# Patient Record
Sex: Male | Born: 1955 | Race: Black or African American | Hispanic: No | Marital: Single | Smoking: Never smoker
Health system: Southern US, Community
[De-identification: ages and names within clinical notes are randomized; demographics above are authoritative.]

## PROBLEM LIST (undated history)

## (undated) DIAGNOSIS — I10 Essential (primary) hypertension: Secondary | ICD-10-CM

## (undated) DIAGNOSIS — Z85068 Personal history of other malignant neoplasm of small intestine: Secondary | ICD-10-CM

## (undated) DIAGNOSIS — N179 Acute kidney failure, unspecified: Secondary | ICD-10-CM

## (undated) DIAGNOSIS — E785 Hyperlipidemia, unspecified: Secondary | ICD-10-CM

## (undated) DIAGNOSIS — Z87442 Personal history of urinary calculi: Secondary | ICD-10-CM

## (undated) DIAGNOSIS — Z801 Family history of malignant neoplasm of trachea, bronchus and lung: Secondary | ICD-10-CM

## (undated) HISTORY — DX: Personal history of other malignant neoplasm of small intestine: Z85.068

## (undated) HISTORY — DX: Acute kidney failure, unspecified: N17.9

## (undated) HISTORY — DX: Personal history of urinary calculi: Z87.442

## (undated) HISTORY — DX: Family history of malignant neoplasm of trachea, bronchus and lung: Z80.1

## (undated) HISTORY — DX: Essential (primary) hypertension: I10

## (undated) HISTORY — DX: Hyperlipidemia, unspecified: E78.5

---

## 2007-06-19 ENCOUNTER — Ambulatory Visit: Payer: Self-pay | Admitting: Family Medicine

## 2007-06-19 ENCOUNTER — Observation Stay (HOSPITAL_COMMUNITY): Admission: EM | Admit: 2007-06-19 | Discharge: 2007-06-20 | Payer: Self-pay | Admitting: Emergency Medicine

## 2007-06-20 ENCOUNTER — Ambulatory Visit: Payer: Self-pay | Admitting: Family Medicine

## 2007-06-26 ENCOUNTER — Ambulatory Visit: Payer: Self-pay | Admitting: Family Medicine

## 2007-06-26 DIAGNOSIS — Z87442 Personal history of urinary calculi: Secondary | ICD-10-CM

## 2007-06-26 DIAGNOSIS — N179 Acute kidney failure, unspecified: Secondary | ICD-10-CM

## 2007-06-26 HISTORY — DX: Personal history of urinary calculi: Z87.442

## 2007-07-10 ENCOUNTER — Ambulatory Visit: Payer: Self-pay | Admitting: Family Medicine

## 2007-07-10 ENCOUNTER — Encounter: Payer: Self-pay | Admitting: Family Medicine

## 2007-07-10 LAB — CONVERTED CEMR LAB
BUN: 17 mg/dL (ref 6–23)
CO2: 22 meq/L (ref 19–32)
Calcium: 9.4 mg/dL (ref 8.4–10.5)
Chloride: 105 meq/L (ref 96–112)
Creatinine, Ser: 1.01 mg/dL (ref 0.40–1.50)
Glucose, Bld: 93 mg/dL (ref 70–99)
Potassium: 4.7 meq/L (ref 3.5–5.3)
Sodium: 140 meq/L (ref 135–145)

## 2007-07-11 ENCOUNTER — Encounter: Payer: Self-pay | Admitting: Family Medicine

## 2009-01-22 ENCOUNTER — Ambulatory Visit: Payer: Self-pay | Admitting: Family Medicine

## 2009-01-22 ENCOUNTER — Encounter (INDEPENDENT_AMBULATORY_CARE_PROVIDER_SITE_OTHER): Payer: Self-pay | Admitting: Family Medicine

## 2009-01-22 DIAGNOSIS — I10 Essential (primary) hypertension: Secondary | ICD-10-CM | POA: Insufficient documentation

## 2009-01-23 LAB — CONVERTED CEMR LAB
BUN: 12 mg/dL (ref 6–23)
CO2: 25 meq/L (ref 19–32)
Calcium: 9.1 mg/dL (ref 8.4–10.5)
Chloride: 105 meq/L (ref 96–112)
Creatinine, Ser: 0.89 mg/dL (ref 0.40–1.50)
Glucose, Bld: 105 mg/dL — ABNORMAL HIGH (ref 70–99)
Potassium: 4.5 meq/L (ref 3.5–5.3)
Sodium: 140 meq/L (ref 135–145)

## 2009-02-06 ENCOUNTER — Encounter: Payer: Self-pay | Admitting: Family Medicine

## 2009-02-06 ENCOUNTER — Ambulatory Visit: Payer: Self-pay | Admitting: Family Medicine

## 2009-02-12 ENCOUNTER — Encounter: Payer: Self-pay | Admitting: Family Medicine

## 2009-02-12 LAB — CONVERTED CEMR LAB
ALT: 31 units/L (ref 0–53)
AST: 28 units/L (ref 0–37)
Albumin: 4.2 g/dL (ref 3.5–5.2)
Alkaline Phosphatase: 59 units/L (ref 39–117)
BUN: 14 mg/dL (ref 6–23)
CO2: 24 meq/L (ref 19–32)
Calcium: 9.3 mg/dL (ref 8.4–10.5)
Chloride: 105 meq/L (ref 96–112)
Creatinine, Ser: 0.94 mg/dL (ref 0.40–1.50)
Direct LDL: 139 mg/dL — ABNORMAL HIGH
Glucose, Bld: 105 mg/dL — ABNORMAL HIGH (ref 70–99)
Potassium: 4.2 meq/L (ref 3.5–5.3)
Sodium: 138 meq/L (ref 135–145)
Total Bilirubin: 0.3 mg/dL (ref 0.3–1.2)
Total Protein: 7.3 g/dL (ref 6.0–8.3)

## 2009-02-20 ENCOUNTER — Encounter: Payer: Self-pay | Admitting: Family Medicine

## 2009-02-23 ENCOUNTER — Encounter: Payer: Self-pay | Admitting: *Deleted

## 2009-03-02 ENCOUNTER — Encounter: Payer: Self-pay | Admitting: Family Medicine

## 2009-05-11 ENCOUNTER — Encounter: Payer: Self-pay | Admitting: Family Medicine

## 2009-07-15 ENCOUNTER — Ambulatory Visit: Payer: Self-pay | Admitting: Family Medicine

## 2009-07-15 DIAGNOSIS — E663 Overweight: Secondary | ICD-10-CM | POA: Insufficient documentation

## 2009-07-15 LAB — CONVERTED CEMR LAB

## 2009-07-20 ENCOUNTER — Ambulatory Visit: Payer: Self-pay | Admitting: Family Medicine

## 2009-07-20 ENCOUNTER — Encounter: Payer: Self-pay | Admitting: Family Medicine

## 2009-07-20 LAB — CONVERTED CEMR LAB
ALT: 23 units/L (ref 0–53)
BUN: 12 mg/dL (ref 6–23)
CO2: 24 meq/L (ref 19–32)
Calcium: 9.4 mg/dL (ref 8.4–10.5)
Chloride: 103 meq/L (ref 96–112)
Cholesterol: 190 mg/dL (ref 0–200)
Creatinine, Ser: 0.91 mg/dL (ref 0.40–1.50)
HCT: 37.6 % — ABNORMAL LOW (ref 39.0–52.0)
Hemoglobin: 12.2 g/dL — ABNORMAL LOW (ref 13.0–17.0)
Platelets: 287 10*3/uL (ref 150–400)
Total CHOL/HDL Ratio: 5
WBC: 6 10*3/uL (ref 4.0–10.5)

## 2009-09-16 ENCOUNTER — Encounter: Payer: Self-pay | Admitting: Family Medicine

## 2009-09-16 ENCOUNTER — Encounter: Payer: Self-pay | Admitting: *Deleted

## 2009-09-16 ENCOUNTER — Ambulatory Visit: Payer: Self-pay | Admitting: Family Medicine

## 2009-09-16 DIAGNOSIS — E782 Mixed hyperlipidemia: Secondary | ICD-10-CM

## 2009-10-21 ENCOUNTER — Encounter: Payer: Self-pay | Admitting: Family Medicine

## 2009-10-21 ENCOUNTER — Ambulatory Visit: Payer: Self-pay | Admitting: Family Medicine

## 2009-10-22 ENCOUNTER — Encounter: Payer: Self-pay | Admitting: Family Medicine

## 2009-10-22 LAB — CONVERTED CEMR LAB
CO2: 25 meq/L (ref 19–32)
Calcium: 9 mg/dL (ref 8.4–10.5)
Glucose, Bld: 177 mg/dL — ABNORMAL HIGH (ref 70–99)
Sodium: 144 meq/L (ref 135–145)

## 2009-10-30 ENCOUNTER — Ambulatory Visit: Payer: Self-pay | Admitting: Family Medicine

## 2009-11-18 ENCOUNTER — Encounter: Payer: Self-pay | Admitting: Family Medicine

## 2009-12-24 ENCOUNTER — Telehealth: Payer: Self-pay | Admitting: *Deleted

## 2009-12-25 ENCOUNTER — Encounter: Payer: Self-pay | Admitting: Family Medicine

## 2010-01-28 ENCOUNTER — Encounter: Payer: Self-pay | Admitting: Family Medicine

## 2010-03-30 ENCOUNTER — Telehealth: Payer: Self-pay | Admitting: Family Medicine

## 2010-04-07 ENCOUNTER — Ambulatory Visit: Payer: Self-pay | Admitting: Family Medicine

## 2010-04-07 ENCOUNTER — Encounter: Payer: Self-pay | Admitting: Family Medicine

## 2010-04-07 DIAGNOSIS — E119 Type 2 diabetes mellitus without complications: Secondary | ICD-10-CM | POA: Insufficient documentation

## 2010-04-07 LAB — CONVERTED CEMR LAB
AST: 28 units/L (ref 0–37)
BUN: 11 mg/dL (ref 6–23)
CO2: 23 meq/L (ref 19–32)
Calcium: 9.3 mg/dL (ref 8.4–10.5)
Chloride: 105 meq/L (ref 96–112)
Creatinine, Ser: 0.9 mg/dL (ref 0.40–1.50)
Glucose, Bld: 186 mg/dL — ABNORMAL HIGH (ref 70–99)
Total CK: 256 units/L — ABNORMAL HIGH (ref 7–232)

## 2010-04-08 ENCOUNTER — Encounter: Payer: Self-pay | Admitting: Family Medicine

## 2010-04-16 ENCOUNTER — Encounter: Payer: Self-pay | Admitting: Family Medicine

## 2010-04-16 ENCOUNTER — Ambulatory Visit: Payer: Self-pay | Admitting: Family Medicine

## 2010-04-16 LAB — CONVERTED CEMR LAB
Chloride: 104 meq/L (ref 96–112)
Potassium: 4 meq/L (ref 3.5–5.3)
Sodium: 139 meq/L (ref 135–145)

## 2010-04-26 ENCOUNTER — Encounter: Admission: RE | Admit: 2010-04-26 | Discharge: 2010-06-24 | Payer: Self-pay | Admitting: Family Medicine

## 2010-04-27 ENCOUNTER — Ambulatory Visit: Payer: Self-pay | Admitting: Family Medicine

## 2010-04-27 ENCOUNTER — Encounter: Payer: Self-pay | Admitting: Family Medicine

## 2010-04-28 LAB — CONVERTED CEMR LAB
BUN: 16 mg/dL (ref 6–23)
CO2: 23 meq/L (ref 19–32)
Chloride: 105 meq/L (ref 96–112)
Glucose, Bld: 173 mg/dL — ABNORMAL HIGH (ref 70–99)
Potassium: 4.3 meq/L (ref 3.5–5.3)

## 2010-06-25 ENCOUNTER — Ambulatory Visit: Payer: Self-pay | Admitting: Family Medicine

## 2010-10-26 NOTE — Letter (Signed)
Summary: Urology referral  [facility]  [address]   [place], [place] [address]   Phone: [phone]  Fax: [phone]    12/25/2009  OLMAN YONO [address] [place], [place]  [address]  Dear Mr. Wyszynski,  Our record indicates that you cancelled and have not rescheduled an appointment to see Dr. Brunilda Payor, a urologist I referred you to.  This referral was made due to concerns about your PSA result that was elevated in December 2010.  An elevated PSA leads [place] to be concerned about a cancerous process.  Please call Dr. Madilyn Hook office to reschedule this appointment.        Sincerely,   Summar Mcglothlin MD  Appended Document: Urology referral certified letter mailed.

## 2010-10-26 NOTE — Assessment & Plan Note (Signed)
Summary: f/up,tcb   Vital Signs:  Patient profile:   55 year old male Height:      64 inches Weight:      205 pounds BMI:     35.32 Temp:     98.1 degrees F oral Pulse rate:   73 / minute BP sitting:   161 / 94  (right arm) Cuff size:   regular  Vitals Entered By: Tessie Fass CMA (April 07, 2010 9:12 AM) CC: F/U Is Patient Diabetic? No Pain Assessment Patient in pain? no        Primary Care Provider:  Shaneece Stockburger MD  CC:  F/U.  History of Present Illness: 55 y/o M here for   HYPERTENSION Disease Monitoring Blood pressure range: 130s when he checks at [facility] Medications: Norvasc 10, Coreg XL 20 Compliance: yes Lightheadedness: no  Edema: no  Chest pain: no  Dyspnea:no Prevention Exercise: no  Salt restriction:yes   HYPERLIPIDEMIA: Med: Simvastatin  Compliance: no, stopped taking at least 1 month ago Prob taking med: yes, joint pain started after he took simva, stopped 1 month ago, states feels better Muscle aches: no, but has joint pain Abd  pain: no  Joint pain:   DIABETES Meds Compliance    Diet Lightheadedness    Dizziness    Confusion    Shakiness    Abd  pain   Nausea    Vomiting      ARF:    Habits & Providers  Alcohol-Tobacco-Diet     Tobacco Status: never  Allergies: No Known Drug Allergies  Past History:  Past Surgical History: Last updated: 06/26/2007 None  Family History: Last updated: 07/15/2009 M - d 61 CVA, MI, DM, CHF, HTN F - d 21s alcoholism 3 sisters living and healthy - one with hypertension 2 brothers living and healthy No colon CA No prostate CA  Social History: Last updated: 07/15/2009 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  No tobacco, etoh, or drug use.  Never marrieed.  Completed college.  Risk Factors: Alcohol Use: 0 (09/16/2009) Exercise: yes (09/16/2009)  Risk  Factors: Smoking Status: never (04/07/2010)  Past Medical History: ARF d/t lisinopril and/or diazide - was on both in a 2 week period - cr up to 4.99 Nephrolithiasis HTN Diabetes HLD Noncompliance with meds   Physical Exam  General:  Well-developed,well-nourished,in no acute distress; alert,appropriate and cooperative throughout examination. vitals reviewed.   Head:  Normocephalic and atraumatic without obvious abnormalities.  Neck:  No deformities, masses, or tenderness noted. Lungs:  Normal respiratory effort, chest expands symmetrically. Lungs are clear to auscultation, no crackles or wheezes. Heart:  Normal rate and regular rhythm. S1 and S2 normal without gallop, murmur, click, rub or other extra sounds. Abdomen:  Bowel sounds positive,abdomen soft and non-tender without masses, organomegaly or hernias noted. Msk:  No deformity or scoliosis noted of thoracic or lumbar spine.  normal ROM, no joint tenderness, no joint swelling, no joint warmth, and no redness over joints.   Pulses:  R and L carotid,radial,dorsalis pedisl pulses are full and equal bilaterally Extremities:  No clubbing, cyanosis, edema, or deformity noted with normal full range of motion of all joints.   Neurologic:  No cranial nerve deficits noted. Station and gait are normal. Plantar reflexes are down-going bilaterally. DTRs are symmetrical throughout. Sensory, motor and coordinative functions appear intact. Skin:  Intact without suspicious lesions or rashes Cervical Nodes:  No lymphadenopathy noted   Impression & Recommendations:  Problem # 1:  HYPERTENSION (ICD-401.9) Assessment Unchanged BPs in clinic seems to be higher than when pt checks at pharmacy.  Regardless will need ot make changes to his meds.  1) He was taking Simva 40 and Norvasc 10.  There is warning with Simva and this dose of Norvasc.  I have decreased Norvasc to 5mg .  Will continue Coreg xl 20mg .  Pt now has diabetes so will need ACEi.  In 2008 pt  was on Lisinopril and Diazide for 1 week and develeoped ARF with Cr at 4.99.  Both of these meds were held and pt given IVFs.  Renal function improved.  His last Cr here in clinic in 09/2009 was 0.87.  I plan to start pt on an ARB today.  Will also check Cr today.  Pt to rtc in 1 wk for repeat Cr check.  Will monitor renal function closely as ARB may also potentiate renal disturbance.   His updated medication list for this problem includes:    Norvasc 5 Mg Tabs (Amlodipine besylate) .Marland Kitchen... 1 tab by mouth daily for blood pressure    Coreg Cr 20 Mg Xr24h-cap (Carvedilol phosphate) .Marland Kitchen... 1 capsule by mouth daily for blood pressure    Cozaar 25 Mg Tabs (Losartan potassium) .Marland Kitchen... 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  Pt stopped taking Simva at least one month ago.  We discussed lifestyle modicaitons like diet, exercise, reducing amt of animal products.  Pt was not interested in these options.  Will switch to pravastatin.  Will check LFT and total CK.    His updated medication list for this problem includes:    Pravachol 40 Mg Tabs (Pravastatin sodium) .Marland Kitchen... 1 tab by mouth at bedtime for cholesterol  Orders: Comp Met-[facility] (30160-10932) CK (Creatine Kinase)-[facility] (82550-23250) [facility]- Est  Level 4 ([address])  Problem # 3:  PAIN IN JOINT, MULTIPLE SITES (ICD-719.49) Assessment: New No joint tenderness on exam.  Pt states improvement since he stopped taking simva.   Orders: CK (Creatine Kinase)-[facility] [phone]) [facility]- Est  Level 4 ([address]06)  Problem # 4:  DIABETES MELLITUS, TYPE II (ICD-250.00) Assessment: New New Dx in Feb 2011 when I checked A1C, which was 8.1.  I had planned to start Metformin today, but given that I have started an ARB and pt with history of ARF in 2008 s/p ACEi and Diazide.  I will hold off on starting metformin since it can  also cause renal dysfunction.  Pt to rtc in 2 wks and if renal function ok at that time we may start metformin.  If renal functin worsens we do have insulin as an option.   His updated medication list for this problem includes:    Aspirin 81 Mg Tbec (Aspirin) .Marland Kitchen... 1 tab by mouth daily    Cozaar 25 Mg Tabs (Losartan potassium) .Marland Kitchen... 1 tab by mouth daily for blood pressure  Orders: [facility]- Est  Level 4 ([address]214)  Problem # 5:  Hx of RENAL FAILURE, ACUTE NOS (ICD-584.9) Assessment: Comment Only ARF within one week of taking ACEi and DIAZIDE in 2008.  Pt was admitted and given IVF.  Offending drugs discontineued and renal function returned to normal.  We will keep close monitor on renal function as  we are treating htn and dm now.    Complete Medication List: 1)  Norvasc 5 Mg Tabs (Amlodipine besylate) .Marland Kitchen.. 1 tab by mouth daily for blood pressure 2)  Pravachol 40 Mg Tabs (Pravastatin sodium) .Marland Kitchen.. 1 tab by mouth at bedtime for cholesterol 3)  Aspirin 81 Mg Tbec (Aspirin) .Marland Kitchen.. 1 tab by mouth daily 4)  Coreg Cr 20 Mg Xr24h-cap (Carvedilol phosphate) .Marland Kitchen.. 1 capsule by mouth daily for blood pressure 5)  Cozaar 25 Mg Tabs (Losartan potassium) .Marland Kitchen.. 1 tab by mouth daily for blood pressure  Patient Instructions: 1)  Please schedule a follow-up appointment in 1 week for lab.  2)  Please schedule a follow-up appointment in 2 weeks with Dr Janalyn Harder.  3)  I have added a new blood pressure medication, Losartan.  We need to check your kidneys in one week to make sure that they are ok. 4)  If you start to feel bad, stop the medication and call [facility] right away. 5)  I will not start diabetes medication at this time since we are starting new blood pressure medication.  Prescriptions: COZAAR 25 MG TABS (LOSARTAN POTASSIUM) 1 tab by mouth daily for blood pressure  #30 x 1   Entered and Authorized by:   Angeline Slim MD   Signed by:   Angeline Slim MD on 04/07/2010   Method used:   Electronically to        [facility]  [address] [phone]*  (retail)       [address]       [place], [place]  [id number]       Ph: [phone] or [phone]       Fax: [phone]   RxID:   [id number] NORVASC 5 MG TABS (AMLODIPINE BESYLATE) 1 tab by mouth daily for blood pressure  #30 x 6   Entered and Authorized by:   Angeline Slim MD   Signed by:   Angeline Slim MD on 04/07/2010   Method used:   [facility] to        [facility]  [address] [phone]* (retail)       [address]       [place], [place]  [id number]       Ph: [phone] or [phone]       Fax: [phone]   RxID:   [id number] PRAVACHOL 40 MG TABS (PRAVASTATIN SODIUM) 1 tab by mouth at bedtime for cholesterol  #30 x 6   Entered and Authorized by:   Angeline Slim MD   Signed by:   Angeline Slim MD on 04/07/2010   Method used:   Electronically to        [facility] (retail)       [address]       [place], [place]  [address]       Ph: [phone]       Fax: [phone]   RxID:   [id number]    Prevention & Chronic Care Immunizations   Influenza vaccine: Not documented   Influenza vaccine deferral: Deferred  (07/15/2009)    Tetanus booster: 06/26/2007: Tdap [facility])   Tetanus booster due: 06/25/2017    Pneumococcal vaccine: Not documented  Colorectal Screening   Hemoccult: Not documented    Colonoscopy: Not documented   Colonoscopy action/deferral: GI Referral  (09/16/2009)  Other Screening   PSA:  8.11  (07/20/2009)   PSA action/deferral: Discussed-PSA requested  (07/15/2009)   Smoking status: never  (04/07/2010)  Diabetes Mellitus   HgbA1C: 8.1  (10/30/2009)    Eye exam: Not documented    Foot exam: Not documented   High risk foot: Not documented   Foot care education: Not documented    Urine microalbumin/creatinine ratio: Not documented    Diabetes flowsheet reviewed?: Yes   Progress toward A1C goal: Unchanged  Lipids   Total Cholesterol: 190  (07/20/2009)   LDL:  139  (07/20/2009)   LDL Direct: 139  (02/06/2009)   HDL: 38  (07/20/2009)   Triglycerides: 66  (07/20/2009)    SGOT (AST): 22  (07/20/2009)   SGPT (ALT): 23  (07/20/2009) CMP ordered    Alkaline phosphatase: 68  (07/20/2009)   Total bilirubin: 0.5  (07/20/2009)    Lipid flowsheet reviewed?: Yes   Progress toward LDL goal: Unchanged  Hypertension   Last Blood Pressure: 161 / 94  (04/07/2010)   Serum creatinine: 0.87  (10/21/2009)   Serum potassium 4.1  (10/21/2009) CMP ordered     Hypertension flowsheet reviewed?: Yes   Progress toward BP goal: Unchanged  Self-Management Support :   Personal Goals (by the next clinic visit) :     Personal A1C goal: 7  (04/07/2010)     Personal blood pressure goal: 130/80  (09/16/2009)     Personal LDL goal: 100  (09/16/2009)    Diabetes self-management support: Not documented    Hypertension self-management support: Written self-care plan  (09/16/2009)    Lipid self-management support: Written self-care plan  (09/16/2009)

## 2010-10-26 NOTE — Letter (Signed)
Summary: Missed Urology appt  [facility]  [address]   [place], [place] [address]   Phone: [phone]  Fax: [phone]    01/28/2010  Brandon Peters [address] [place], [place]  [address]  Dear Mr. Haviland,  You missed an appointment with Dr. Brunilda Payor in January.  I referred you to Dr. Brunilda Payor for elevated PSA, which is concerning for your prostate.  Please call Dr. Madilyn Hook office to schedule another appointment.     Sincerely,   Lamel Mccarley MD  Appended Document: Missed Urology appt cert mailed

## 2010-10-26 NOTE — Consult Note (Signed)
Summary: [facility]  [facility]   Imported By: Bradly Bienenstock 04/28/2010 13:07:20  _____________________________________________________________________  External Attachment:    Type:   Image     Comment:   External Document

## 2010-10-26 NOTE — Progress Notes (Signed)
Summary: Labs, meds  Phone Note Outgoing Call   Call placed by: Jasminemarie Sherrard MD,  March 30, 2010 2:27 PM Call placed to: Patient Summary of Call: Called pt Re 1)[facility]vasc 10 + simva 40.  they do not recommend dose of simva higher than 20mg  with norvasc 2)sent letter in 10/2009 re A1C of >8.  I would like to start treating.  Pt never responded. 3)missed urology appt.  Pt states he will call today to schedule appt with me next week.   Initial call taken by: Deshun Sedivy MD,  March 30, 2010 2:27 PM

## 2010-10-26 NOTE — Letter (Signed)
Summary: Generic Letter: Needs appt, needs to be treated for DM  [facility]  [address]   [place], [place] [address]   Phone: [phone]  Fax: [phone]    11/18/2009  [name] [address] [place], [place]  [address]  Dear [name],  You missed your appointment with me today.  I think that it is important that you come to the [facility] to be examined and for follow-up of high blood pressure.  I also want to discuss Diabetes with you.  Your test result showed that you have diabetes (HgA1C = 8.1%) and that you need to start taking medications to treat this.  Untreated and uncontrolled diabetes can affect your heart, kidneys, eyes, and other organs.  Please call to schedule an appointment with me as soon as possible.   Sincerely,   Kiauna Zywicki MD  Appended Document: Generic Letter: Needs appt, needs to be treated for DM mailed.

## 2010-10-26 NOTE — Progress Notes (Signed)
Summary: re: Urology/TS.  Pt cancelled Urology appt  ---- Converted from flag ---- ---- 12/24/2009 5:51 AM, Cat Ta MD wrote: Pt had appt with Dr Brunilda Payor (urology) 10/07/09.  Please call for report. thank you. CT ------------------------------  pt cancelled his appt..per Dr.Nesi's office 'it was not the first time, that he cancelled his appt'

## 2010-10-26 NOTE — Assessment & Plan Note (Signed)
Summary: f/unHTN, DM,df   Vital Signs:  Patient profile:   55 year old male Height:      64 inches Weight:      188.8 pounds BMI:     32.52 BSA:     1.91 Temp:     98.4 degrees F Pulse rate:   67 / minute BP sitting:   140 / 88  Vitals Entered By: Jone Baseman CMA (June 25, 2010 1:51 PM) CC: F/u Is Patient Diabetic? Yes Did you bring your meter with you today? No Pain Assessment Patient in pain? no        Primary Care Provider:  Alethea Terhaar MD  CC:  F/u.  History of Present Illness: 55 y/o M here for f/u of DM, HTN   DIABETES Meds: Metformin 500mg  two times a day  Compliance : Yes   Diet: Following diet by Diabetic Nutrition Lightheadedness: no    Dizziness : no  Confusion: no    Shakiness : no Abd  pain:no   Nausea:no    Vomiting:no    Some some queasiness when first started Metformin  Weight: -15.2 lbs since last visit.   CBGs fasting: 87-104  HYPERTENSION Disease Monitoring Blood pressure range: 150s-160s Medications: coreg xl 2, amlodipine 10, Cozaar 25 Compliance:yes   Lightheadedness: no Edema: no Chest pain: no  Dyspnea: no  Palpitations: no Prevention Exercise: walking 5x/week one week, then 2x/wk next week.  Plans to exercise everyday Salt restriction: No SALT  OBESITY BMI: 32.5  vs 34.9 at previous visit.  Weight difference since last OV: -15.2 lbs Exercise:  yes, walking       How often:    alternating 5x/wk and 2x/wk.  will do everyday now that his work schedule has changed   Diet: yes, per Nutritionist reccommendations   Habits & Providers  Alcohol-Tobacco-Diet     Alcohol drinks/day: 0     Alcohol Counseling: not indicated; patient does not drink     Tobacco Status: never  Current Medications (verified): 1)  Amlodipine Besylate 10 Mg Tabs (Amlodipine Besylate) .Marland Kitchen.. 1 Tab By Mouth Daily 2)  Pravachol 40 Mg Tabs (Pravastatin Sodium) .Marland Kitchen.. 1 Tab By Mouth At Bedtime For Cholesterol 3)  Aspirin 81 Mg Tbec (Aspirin) .Marland Kitchen.. 1 Tab By Mouth  Daily 4)  Coreg Cr 20 Mg Xr24h-Cap (Carvedilol Phosphate) .Marland Kitchen.. 1 Capsule By Mouth Daily For Blood Pressure 5)  Cozaar 25 Mg Tabs (Losartan Potassium) .Marland Kitchen.. 1 Tab By Mouth Daily For Blood Pressure 6)  Metformin Hcl 500 Mg Tabs (Metformin Hcl) .Marland Kitchen.. 1 Tab By Mouth Daily X 5 Days, Then Two Times A Day  Allergies (verified): No Known Drug Allergies  Past History:  Past Medical History: Last updated: 04/07/2010 ARF d/t lisinopril and/or diazide - was on both in a 2 week period - cr up to 4.99 Nephrolithiasis HTN Diabetes HLD Noncompliance with meds   Past Surgical History: Last updated: 06/26/2007 None  Family History: Last updated: 07/15/2009 M - d 32 CVA, MI, DM, CHF, HTN F - d 9s alcoholism 3 sisters living and healthy - one with hypertension 2 brothers living and healthy No colon CA No prostate CA  Social History: Last updated: 07/15/2009 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  No tobacco, etoh, or drug use.  Never marrieed.  Completed college.  Risk Factors: Alcohol Use: 0 (06/25/2010)  Exercise: yes (09/16/2009)  Risk Factors: Smoking Status: never (06/25/2010)  Review of Systems       per hpi   Physical Exam  General:  Well-developed,well-nourished,in no acute distress; alert,appropriate and cooperative throughout examination. vitals reviewed.  Neck:  no bruit Lungs:  Normal respiratory effort, chest expands symmetrically. Lungs are clear to auscultation, no crackles or wheezes. Heart:  Normal rate and regular rhythm. S1 and S2 normal without gallop, murmur, click, rub or other extra sounds. Abdomen:  Bowel sounds positive,abdomen soft and non-tender without masses, organomegaly or hernias noted. Pulses:  R radial normal, R dorsalis pedis normal, L radial normal, and L dorsalis pedis normal.   Extremities:  No clubbing, cyanosis,  edema, or deformity noted with normal full range of motion of all joints.   Neurologic:  alert & oriented X3.   Skin:  Intact without suspicious lesions or rashes Cervical Nodes:  No lymphadenopathy noted   Impression & Recommendations:  Problem # 1:  HYPERTENSION (ICD-401.9) Assessment Improved BP much better now and is closer to goal of 130/80.  His range used to be 150s-160-170s.  Will not make changes to meds today.  Will check Bmet for renal insuff since pt had ARF with diazide in 2009.   His updated medication list for this problem includes:    Amlodipine Besylate 10 Mg Tabs (Amlodipine besylate) .Marland Kitchen... 1 tab by mouth daily    Coreg Cr 20 Mg Xr24h-cap (Carvedilol phosphate) .Marland Kitchen... 1 capsule by mouth daily for blood pressure    Cozaar 25 Mg Tabs (Losartan potassium) .Marland Kitchen... 1 tab by mouth daily for blood pressure  Orders: Basic Met-[facility] ([address]-60454) [facility]- Est  Level 4 (09811)  Problem # 2:  DIABETES MELLITUS, TYPE II (ICD-250.00) Assessment: Improved A1C 6.2!!!!   This is at goal <7.  He last lost 15 lbs, which is contributing to great glucose control.  Will continue metformin 500 two times a day.   Will need to check FLP in Oct.  His updated medication list for this problem includes:    Aspirin 81 Mg Tbec (Aspirin) .Marland Kitchen... 1 tab by mouth daily    Cozaar 25 Mg Tabs (Losartan potassium) .Marland Kitchen... 1 tab by mouth daily for blood pressure    Metformin Hcl 500 Mg Tabs (Metformin hcl) .Marland Kitchen... 1 tab by mouth daily x 5 days, then two times a day  Orders: A1C-[facility] (91478) [facility]- Est  Level 4 (29562)  Problem # 3:  OBESITY, CLASS I (ICD-278.02) Assessment: Improved  Lost 15 lbs since last visit.  Very proud of himself.  He is very motivated to lose weight now.    Orders: [facility]- Est  Level 4 (13086)  Complete Medication List: 1)  Amlodipine Besylate 10 Mg Tabs (Amlodipine besylate) .Marland Kitchen.. 1 tab by mouth daily 2)  Pravachol 40 Mg Tabs (Pravastatin sodium) .Marland Kitchen.. 1 tab by mouth at bedtime for  cholesterol 3)  Aspirin 81 Mg Tbec (Aspirin) .Marland Kitchen.. 1 tab by mouth daily 4)  Coreg Cr 20 Mg Xr24h-cap (Carvedilol phosphate) .Marland Kitchen.. 1 capsule by mouth daily for blood pressure 5)  Cozaar 25 Mg Tabs (Losartan potassium) .Marland Kitchen.. 1 tab by mouth daily for blood pressure 6)  Metformin Hcl 500 Mg Tabs (Metformin hcl) .Marland Kitchen.. 1 tab by mouth daily x 5 days, then two times a day  Patient Instructions: 1)  Please schedule a follow-up appointment in 3 months for diabetes, HTN. 2)  Please make appt for cholesterol check one week before appt with Dr Janalyn Harder.  This has to be  fasting, so no food or drink after midnight the night before appointment. 3)  Continue all your medications as directed. 4)  Congrulations on your weight loss!!!  Keep up the good work.  Prescriptions: COZAAR 25 MG TABS (LOSARTAN POTASSIUM) 1 tab by mouth daily for blood pressure  #30 Tablet x 6   Entered and Authorized by:   Angeline Slim MD   Signed by:   Angeline Slim MD on 06/25/2010   Method used:   [facility]  [address] [phone]* (retail)       [address]       [place], [place]  [address]       Ph: [phone] or [phone]       Fax: [phone]   RxID:   [id number] AMLODIPINE BESYLATE 10 MG TABS (AMLODIPINE BESYLATE) 1 tab by mouth daily  #30 Tablet x 6   Entered and Authorized by:   Angeline Slim MD   Signed by:   Angeline Slim MD on 06/25/2010   Method used:   [facility]        [facility]  [address] [phone]* (retail)       [address]       [place], [place]  [address]       Ph: [phone] or [phone]       Fax: [phone]   RxID:   [id number]    Prevention & Chronic Care Immunizations   Influenza vaccine: Not documented   Influenza vaccine deferral: Deferred  (07/15/2009)    Tetanus booster: 06/26/2007: Tdap [facility])   Tetanus booster due: 06/25/2017    Pneumococcal vaccine: Not documented  Colorectal Screening   Hemoccult: Not  documented    Colonoscopy: Not documented   Colonoscopy action/deferral: GI Referral  (09/16/2009)  Other Screening   PSA: 8.11  (07/20/2009)   PSA action/deferral: Discussed-PSA requested  (07/15/2009)   Smoking status: never  (06/25/2010)  Diabetes Mellitus   HgbA1C: 6.2  (06/25/2010)    Eye exam: Not documented    Foot exam: Not documented   High risk foot: Not documented   Foot care education: Not documented    Urine microalbumin/creatinine ratio: Not documented   Urine microalbumin action/deferral: Ordered    Diabetes flowsheet reviewed?: Yes   Progress toward A1C goal: Improved  Lipids   Total Cholesterol: 190  (07/20/2009)   LDL: 139  (07/20/2009)   LDL Direct: 139  (02/06/2009)   HDL: 38  (07/20/2009)   Triglycerides: 66  (07/20/2009)    SGOT (AST): 28  (04/07/2010)   SGPT (ALT): 30  (04/07/2010)   Alkaline phosphatase: 64  (04/07/2010)   Total bilirubin: 0.4  (04/07/2010)    Lipid flowsheet reviewed?: Yes   Progress toward LDL goal: Unchanged  Hypertension   Last Blood Pressure: 140 / 88  (06/25/2010)   Serum creatinine: 0.91  (04/27/2010)   Serum potassium 4.3  (04/27/2010)    Hypertension flowsheet reviewed?: Yes   Progress toward BP goal: Improved  Self-Management Support :   Personal Goals (by the next clinic visit) :     Personal A1C goal: 7  (04/07/2010)     Personal blood pressure goal: 130/80  (09/16/2009)     Personal LDL goal: 100  (09/16/2009)    Diabetes self-management support: Not documented    Hypertension self-management support: Written self-care plan  (09/16/2009)  Lipid self-management support: Written self-care plan  (09/16/2009)    Laboratory Results   Blood Tests   Date/Time Received: June 25, 2010 1:46 PM  Date/Time Reported: June 25, 2010 2:26 PM   HGBA1C: 6.2%   (Normal Range: Non-Diabetic - 3-6%   Control Diabetic - 6-8%)  Comments: ...............test performed by......Marland KitchenBonnie A. [place], MLS  (ASCP)cm

## 2010-10-26 NOTE — Miscellaneous (Signed)
Summary: order for A1C  Clinical Lists Changes  Problems: Added new problem of HYPERGLYCEMIA (ICD-790.29) Orders: Added new Test order of A1C-[facility] [id number]) - Signed

## 2010-10-26 NOTE — Miscellaneous (Signed)
Summary: Referral for Urology  Clinical Lists Changes [facility] Pt will call and schedule his own appt due to his alternating work schedule.Molly Maduro [facility] CMA  September 16, 2009 11:03 AM  Pt had appt with Dr Brunilda Payor 10/07/09.  Will call for report. Deantre Bourdon MD  December 24, 2009 5:51 AM  Per Dr Madilyn Hook office, pt cancelled appt. Delrick Dehart MD  December 24, 2009 6:00 PM  Letter sent to pt by me in April 2011 and May 2011 to remind pt to schedule appt with Dr Brunilda Payor.  No response from pt.  Kaylinn Dedic MD  Jan 28, 2010 5:17 PM

## 2010-10-26 NOTE — Miscellaneous (Signed)
Summary: Refer to Diabetic Education and Nutrition  Clinical Lists Changes  Orders: Added new Referral order of Nutrition Referral (Nutrition) - Signed

## 2010-10-26 NOTE — Assessment & Plan Note (Signed)
Summary: f/u visit/bmc   Vital Signs:  Patient profile:   55 year old male Height:      64 inches Weight:      203 pounds BMI:     34.97 BSA:     1.97 Temp:     98.0 degrees F Pulse rate:   61 / minute BP sitting:   155 / 89  Vitals Entered By: Jone Baseman CMA (April 27, 2010 8:44 AM) CC: f/u HTN Is Patient Diabetic? Yes Pain Assessment Patient in pain? no        Primary Care Provider:  Cat Ta MD  CC:  f/u HTN.  History of Present Illness: 55 y/o M here for f/u of HTN   HYPERTENSION Pt was placed on ACEi+Diazide in 2008-->ARF within less than a week.  Cozaar was just started at last visit.   Disease Monitoring Blood pressure range: 160s Medications: norvasc 5, cozaar 25, coreg xl 20mg  Compliance: Lightheadedness: no Edema: no  Chest pain: no  Dyspnea:no Prevention Exercise: no  Salt restriction: yes   DIABETES Meds Compliance: NA, will start today if renal function is ok.  Diet: Saw Diabetic nutrition last night.  Next appt with Nutrition is next Thurs Lightheadedness: no    Dizziness: no     Confusion: no    Shakiness: no    Abd  pain: no   Nausea: no    Vomiting: no      OBESITY BMI: 34.9 Weight difference since last OV: -2 lbs Exercise:  none       How often:      non Diet: now started on DM nutriton classes   no fever, no chills.    Elevated PSA: 06/2009 was >8.  Pt would not like to see urology at this time.  Would like to wait until Oct 2011 to recheck PSA.     Habits & Providers  Alcohol-Tobacco-Diet     Tobacco Status: never  Current Medications (verified): 1)  Amlodipine Besylate 10 Mg Tabs (Amlodipine Besylate) .Marland Kitchen.. 1 Tab By Mouth Daily 2)  Pravachol 40 Mg Tabs (Pravastatin Sodium) .Marland Kitchen.. 1 Tab By Mouth At Bedtime For Cholesterol 3)  Aspirin 81 Mg Tbec (Aspirin) .Marland Kitchen.. 1 Tab By Mouth Daily 4)  Coreg Cr 20 Mg Xr24h-Cap (Carvedilol Phosphate) .Marland Kitchen.. 1 Capsule By Mouth Daily For Blood Pressure 5)  Cozaar 25 Mg Tabs (Losartan Potassium) .Marland Kitchen..  1 Tab By Mouth Daily For Blood Pressure 6)  Metformin Hcl 500 Mg Tabs (Metformin Hcl) .Marland Kitchen.. 1 Tab By Mouth Daily X 5 Days, Then Two Times A Day  Allergies (verified): No Known Drug Allergies  Past History:  Past Medical History: Last updated: 04/07/2010 ARF d/t lisinopril and/or diazide - was on both in a 2 week period - cr up to 4.99 Nephrolithiasis HTN Diabetes HLD Noncompliance with meds   Past Surgical History: Last updated: 06/26/2007 None  Family History: Last updated: 07/15/2009 M - d 19 CVA, MI, DM, CHF, HTN F - d 32s alcoholism 3 sisters living and healthy - one with hypertension 2 brothers living and healthy No colon CA No prostate CA  Social History: Last updated: 07/15/2009 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  No tobacco, etoh, or drug use.  Never marrieed.  Completed college.  Risk Factors: Alcohol Use: 0 (09/16/2009) Exercise: yes (09/16/2009)  Risk Factors:  Smoking Status: never (04/27/2010)  Review of Systems       per hpi   Physical Exam  General:  Well-developed,well-nourished,in no acute distress; alert,appropriate and cooperative throughout examination. vtials reviewed.     Impression & Recommendations:  Problem # 1:  HYPERTENSION (ICD-401.9) Assessment Unchanged BP 155/89 and not at goal.  Pt's BP has been difficult to control as he does not like to take meds.  I started Cozaar 25mg  at last visit.  He has a history ARF when started on ACEi+Diazide combo.  I will go slowly with increasing Cozaar dose to control BP better.  Pt's heart rate is 61, so increasing Coreg is not ideal.  Will increase Amodipine 10mg .    His updated medication list for this problem includes:    Amlodipine Besylate 10 Mg Tabs (Amlodipine besylate) .Marland Kitchen... 1 tab by mouth daily    Coreg Cr 20 Mg Xr24h-cap (Carvedilol phosphate) .Marland Kitchen... 1  capsule by mouth daily for blood pressure    Cozaar 25 Mg Tabs (Losartan potassium) .Marland Kitchen... 1 tab by mouth daily for blood pressure  Orders: Basic Met-[facility] (84166-06301) [facility]- Est  Level 4 (60109)  Problem # 2:  DIABETES MELLITUS, TYPE II (ICD-250.00) Assessment: New A1C was 8.21% in Feb 2011.  He has not been started on meds 2/2 refusal.  Pt is now agreeing to take meds.  He has history of ARF and I started him on Cozaar last visit so did not want to start Metformin at the same time.  Will check Bmet today, if renal fucntion is normal, will start Metformin.   His updated medication list for this problem includes:    Aspirin 81 Mg Tbec (Aspirin) .Marland Kitchen... 1 tab by mouth daily    Cozaar 25 Mg Tabs (Losartan potassium) .Marland Kitchen... 1 tab by mouth daily for blood pressure    Metformin Hcl 500 Mg Tabs (Metformin hcl) .Marland Kitchen... 1 tab by mouth daily x 5 days, then two times a day  Orders: Basic Met-[facility] ([address]355-73220) [facility]- Est  Level 4 (25427)  Problem # 3:  OBESITY, CLASS I (ICD-278.02) Assessment: Improved BMI improved to 34.9 vs 35.2.  Pt seems to be motivated to lose weight now that he has DM dx. STates lots of family with DM.  He is attending DM Nutrition classes.  Will monitor.   Orders: [facility]- Est  Level 4 (06237)  Complete Medication List: 1)  Amlodipine Besylate 10 Mg Tabs (Amlodipine besylate) .Marland Kitchen.. 1 tab by mouth daily 2)  Pravachol 40 Mg Tabs (Pravastatin sodium) .Marland Kitchen.. 1 tab by mouth at bedtime for cholesterol 3)  Aspirin 81 Mg Tbec (Aspirin) .Marland Kitchen.. 1 tab by mouth daily 4)  Coreg Cr 20 Mg Xr24h-cap (Carvedilol phosphate) .Marland Kitchen.. 1 capsule by mouth daily for blood pressure 5)  Cozaar 25 Mg Tabs (Losartan potassium) .Marland Kitchen.. 1 tab by mouth daily for blood pressure 6)  Metformin Hcl 500 Mg Tabs (Metformin hcl) .Marland Kitchen.. 1 tab by mouth daily x 5 days, then two times a day  Patient Instructions: 1)  Please schedule a follow-up appointment in 1 month for diabetes and hypertension.  2)  New medicine for diabetes:  Metformin 500mg  take one pill daily for 5 days then two times a day.  Do not start this until I call you. 3)  New dose of medicine for blood pressure: Amlodipine 10mg  daily.  Finish out your 5mg  by taking 2 pill daily until all gone.  I've sent new prescription to your pharmacy.  4)  You need to lose  weight. Consider a lower calorie diet and regular exercise.  5)  It is important that you exercise reguarly at least 20 minutes 5 times a week. If you develop chest pain, have severe difficulty breathing, or feel very tired, stop exercising immediately and seek medical attention.  Prescriptions: METFORMIN HCL 500 MG TABS (METFORMIN HCL) 1 tab by mouth daily x 5 days, then two times a day  #60 x 3   Entered and Authorized by:   Angeline Slim MD   Signed by:   Angeline Slim MD on 04/27/2010   Method used:   [facility]  [address] [phone][facility] ([facility]ail)       [address]       [place], [place]  [id number]       Ph: [phone] or [phone]       Fax: [phone]   RxID:   [id number] AMLODIPINE BESYLATE 10 MG TABS (AMLODIPINE BESYLATE) 1 tab by mouth daily  #30 x 6   Entered and Authorized by:   Angeline Slim MD   Signed by:   Angeline Slim MD on 04/27/2010   Method used:   [facility]        [facility]  [address] [phone]* (retail)       [address]       [place], [place]  [address]       Ph: [phone] or [phone]       Fax: [phone]   RxID:   [id number]    Prevention & Chronic Care Immunizations   Influenza vaccine: Not documented   Influenza vaccine deferral: Deferred  (07/15/2009)    Tetanus booster: 06/26/2007: Tdap [facility])   Tetanus booster due: 06/25/2017    Pneumococcal vaccine: Not documented  Colorectal Screening   Hemoccult: Not documented    Colonoscopy: Not documented   Colonoscopy action/deferral: GI Referral  (09/16/2009)  Other Screening   PSA: 8.11  (07/20/2009)   PSA action/deferral:  Discussed-PSA requested  (07/15/2009)   Smoking status: never  (04/27/2010)  Diabetes Mellitus   HgbA1C: 8.1  (10/30/2009)    Eye exam: Not documented    Foot exam: Not documented   High risk foot: Not documented   Foot care education: Not documented    Urine microalbumin/creatinine ratio: Not documented   Urine microalbumin action/deferral: Ordered    Diabetes flowsheet reviewed?: Yes   Progress toward A1C goal: Unchanged  Lipids   Total Cholesterol: 190  (07/20/2009)   LDL: 139  (07/20/2009)   LDL Direct: 139  (02/06/2009)   HDL: 38  (07/20/2009)   Triglycerides: 66  (07/20/2009)    SGOT (AST): 28  (04/07/2010)   SGPT (ALT): 30  (04/07/2010)   Alkaline phosphatase: 64  (04/07/2010)   Total bilirubin: 0.4  (04/07/2010)    Lipid flowsheet reviewed?: Yes   Progress toward LDL goal: Unchanged  Hypertension   Last Blood Pressure: 155 / 89  (04/27/2010)   Serum creatinine: 0.89  (04/16/2010)   Serum potassium 4.0  (04/16/2010)    Hypertension flowsheet reviewed?: Yes   Progress toward BP goal: Unchanged  Self-Management Support :   Personal Goals (by the next clinic visit) :     Personal A1C goal: 7  (04/07/2010)     Personal blood pressure goal: 130/80  (09/16/2009)     Personal LDL goal: 100  (09/16/2009)  Diabetes self-management support: Not documented    Hypertension self-management support: Written self-care plan  (09/16/2009)    Lipid self-management support: Written self-care plan  (09/16/2009)

## 2010-11-04 ENCOUNTER — Other Ambulatory Visit: Payer: Self-pay | Admitting: Family Medicine

## 2010-11-04 MED ORDER — PRAVASTATIN SODIUM 40 MG PO TABS: 40.0000 mg | ORAL_TABLET | Freq: Every day | ORAL | Status: DC

## 2010-11-16 ENCOUNTER — Other Ambulatory Visit: Payer: Self-pay | Admitting: Family Medicine

## 2010-11-16 DIAGNOSIS — I10 Essential (primary) hypertension: Secondary | ICD-10-CM

## 2010-11-16 NOTE — Telephone Encounter (Signed)
Refill request

## 2011-01-27 ENCOUNTER — Other Ambulatory Visit: Payer: Self-pay | Admitting: Family Medicine

## 2011-01-27 NOTE — Telephone Encounter (Signed)
Refill request

## 2011-02-04 ENCOUNTER — Other Ambulatory Visit: Payer: Self-pay | Admitting: Family Medicine

## 2011-02-04 NOTE — Telephone Encounter (Signed)
Refill request

## 2011-02-08 NOTE — H&P (Signed)
Brandon Peters, Brandon Peters                ACCOUNT NO.:  [id number]   MEDICAL RECORD NO.:  [id number]          PATIENT TYPE:  OBV   LOCATION:  1845                         FACILITY:  [facility]   PHYSICIAN:  Pearlean Brownie, M.D.DATE OF BIRTH:  Dec 22, 1955   DATE OF ADMISSION:  06/19/2007  DATE OF DISCHARGE:                              HISTORY & PHYSICAL   PRIMARY CARE PHYSICIAN:  None.   CHIEF COMPLAINT:  Acute renal failure.   HISTORY OF PRESENT ILLNESS:  The patient is a 55 year old African  American male who presents as an admit from urgent care in [place] for a  markedly increased creatinine, decreased urinary output after starting  Dyazide about 2 weeks ago.  Prior to that time he had not seen a  physician for several years.  Initially he went to urgent care because  his right ear was stopped up and was diagnosed with a cerumen impaction.  At that time he was found to have a blood pressure of 199/105, so he was  started on Dyazide.  His creatinine at that time was 0.82.   He returned to urgent care yesterday for a recheck and was found to have  a creatinine 4.99, had lost 10 pounds over the past 2 weeks, and he felt  much more fatigued with a blood pressure of 111/69.  He stated to them  that he urinated about one time per day for the past 3 days, which is  decreased from his baseline.  He was found to be not orthostatic by  blood pressure, pulse, or symptoms.  He states he is drinking like  normal, has good p.o. intake, states that maybe he has had some anorexia  for the past couple weeks.  His last dose of blood pressure medication  was on Sunday in the morning and his urination has come back to normal  since stopping it at that time.  He denied taking repeated doses of  NSAIDs or any other medications outside of his blood pressure  medications.   PAST MEDICAL HISTORY:  Hypertension, questionable diabetes mellitus.  Both he states he was told he had in the past 2 weeks from urgent  care.   MEDICATIONS:  None currently, though he was on Dyazide.   ALLERGIES:  NO KNOWN DRUG ALLERGIES.   PAST SURGICAL HISTORY:  None.   FAMILY HISTORY:  Mother is deceased at age 12 had diabetes, congestive  heart failure, CVA, MI.  Father deceased in his 40s from alcoholism.   SOCIAL HISTORY:  The patient works as a Animal nutritionist.  He is single.  He is here with his sister.  No tobacco abuse.  Occasional alcohol use up until 5 years ago and never heavy alcohol use.  No illicit drugs.   REVIEW OF SYSTEMS:  He denies fever, chills, sweats, nausea, vomiting,  chest pain, dyspnea, palpitations, abdominal pain, other complaints.   PHYSICAL EXAMINATION:  VITAL SIGNS:  Temperature 97.3, pulse 70, blood  pressure 120/73, respirations 20, pulse ox 100% on room air.  GENERAL:  In no acute distress, pleasant.  HEENT:  Atraumatic, extraocular movements  intact.  Mucous membranes are  moist, he is making tears.  No nasal discharge.  Pharynx is normal.  NECK:  No lymphadenopathy or bruits.  CARDIOVASCULAR:  Regular rate and rhythm.  No murmurs, rubs or gallops.  LUNGS:  Clear auscultation bilaterally.  No wheezes, rales or rhonchi.  No increased work of breathing.  ABDOMEN:  Soft, nontender, nondistended.  No hepatosplenomegaly.  Bowel  sounds positive.  No renal bruits auscultated.  EXTREMITIES:  No edema.  No skin tenting.  No rashes.   LABORATORY DATA:  On 09/10 his labs were as follows:  Sodium 138,  potassium 4.3, chloride 101, bicarb 21, BUN 13, creatinine 0.82, glucose  94, calcium 9.4.   Labs on 09/22 are as follows:  Sodium 134, potassium 4.0, chloride 94,  bicarb 20, BUN 115, creatinine 4.99, uric acid 14.6.  Urinalysis with  100 glucose, specific gravity of 1.010, moderate blood, 30 protein,  nitrite and leukocyte esterase negative and 2-6 red blood cells.  White  blood cell count of 9.4 with a hemoglobin of 12.8 and platelets 378.   Labs on 09/23 include:   Sodium 133, potassium 4.4, chloride 98, bicarb  22, BUN 109, creatinine 4.52, glucose 174, calcium 9.5.  Urinalysis with  a specific gravity greater than 1.030, trace blood, negative for protein  and glucose, and leukocyte esterase and nitrite.   ASSESSMENT AND PLAN:  This is a 73 -year-old [place] American male with:  1. Acute renal failure likely due to Dyazide with acute onset, normal      baseline.  He does not appear clinically dehydrated (is making      tears, mucous membranes are moist, normal cardiac and no skin      tenting) and he has not changed his drinking habits though was      noted to have a specific gravity of greater than 1.030 today.  Will      check a renal ultrasound to look for obstruction, hydronephrosis.      Will recheck a UA which today was essentially normal except that it      was concentrated and only trace blood today.  Will check a CK with      this finding.  Creatinine is trending in the right direction and he      is no longer oliguric by report.  Will do strict intake and output,      and check daily weights.  Give him an IV fluid bolus of 1 liter (he      was given 2 liters at [facility]) and start at 150 mL/hour of normal      saline.  Of note, he is not orthostatic in the office and at urgent      care today.  2. Hypertension.  Well controlled now off medication in the past      couple days and we will monitor.  Metoprolol 5 mg IV p.r.n. blood      pressure greater than 180/110.  Will check a fasting lipid panel      and TSH in the morning.  3. Hyperglycemia, questionable diagnosis of diabetes.  He did have a      glucose two weeks ago that was 94.  Will check CBG q.a.c. and      q.h.s. with sensitive sliding-scale insulin coverage.  Check A1c.  4. Increased uric acid.  No evidence of gout, question why this was      checked.  He is now off thiazide.  Will watch for signs.  5. Weight loss, also presumably due to medication and fluid loss.      Will hold  Dyazide.  Hydrate, modified diet for now while figuring      out whether he has diabetes or not.  No nutrition consult at this      time.  6. Fatigue, improved off medications.      Norton Blizzard, M.D.  Electronically Signed      Pearlean Brownie, M.D.  Electronically Signed    SH/MEDQ  D:  06/19/2007  T:  06/20/2007  Job:  [id number]

## 2011-02-08 NOTE — Discharge Summary (Signed)
Brandon Peters, Brandon Peters                ACCOUNT NO.:  [id number]   MEDICAL RECORD NO.:  [id number]          PATIENT TYPE:  OBV   LOCATION:  5525                         FACILITY:  [facility]   PHYSICIAN:  Pearlean Brownie, M.D.DATE OF BIRTH:  1956/01/10   DATE OF ADMISSION:  06/19/2007  DATE OF DISCHARGE:  06/20/2007                               DISCHARGE SUMMARY   ADMISSION DIAGNOSIS:  Acute renal failure.   DISCHARGE DIAGNOSIS:  Resolving acute renal failure.   CONSULTATIONS:  None.   PROCEDURES:  None.   BRIEF HOSPITAL COURSE:  The patient is a 55 year old African American  male with no prior medical care except for that which he has gotten in  the last 2 weeks via urgent care in [place].  The patient was seen at  [facility] for a right ear that he said was stopped up, and he was diagnosed  with cerumen impaction.  Incidentally, at this visit, the patient was  noted to have a blood pressure of 200 systolic over approximately 100  diastolic and was therefore started on Diazide to lower his blood  pressure.  Creatinine taken at that time was noted to be 0.82.  The  patient called urgent care approximately 5 to 6 days ago after feeling  lethargic and fatigued, and was called in for a prescription of  lisinopril, which he proceeded to take over the next 5 days.  On  September 22, the patient went into urgent care for a recheck, and was  found to have a creatinine of 4.99.  The patient also reports that he  had lost about 10 pounds over 2 weeks and felt much more fatigued over  that time.  At a recheck, blood pressure was noted to be 111/69.  The  patient reports urinating approximately one times per day over this 2-  week period, and was not found to be orthostatic by blood pressure,  pulse or symptoms.   Accordingly, the patient was sent home and was called back for a recheck  of his creatinine, which was noted to be only mildly decreased to 4.5.  He was therefore sent to [facility]  Emergency Department for admission  by the family practice teaching service.  On admission in the ED, the  patient's creatinine was noted to still be markedly elevated to over  4.5.  The patient's blood pressure was stable at that time and found to  be 128/73.  He had no neurological symptoms and was completely  asymptomatic except for a complaint of some fatigue.  On exam, the  patient appeared well  hydrated.  He was admitted for observation and IV  fluids to correct his acute renal failure.  The patient received several  boluses of normal saline in the ED, and was placed on normal saline  continuous maintenance fluid at a rate of 150 mL an hour.  The patient  tolerated this well without difficulty, and was tolerating a p.o. diet.   On the next morning after date of admission on September 24, the  patient's creatinine had responded well and was decreased to 2.09 from  a  previous creatinine of 4.52 the day prior.  All other electrolytes were  stable at that time.  A UA performed on admission showed a specific  gravity of 1.017 and was otherwise negative.  The patient was also noted  as an outpatient to have some mildly increased blood sugars on random  checks with other electrolyte panels.  Hemoglobin A1c was performed on  this admission and noted to be 5.5.  Cholesterol panel was also  performed, and a total cholesterol was 198 with an LDL of 103 and an HDL  of 33.  Triglycerides were 312.  Creatine kinase  was mildly elevated at  322.  A urine sodium and urine creatinine were also obtained and were 94  and 45.9 respectively, giving the patient a FENa of 3.08%; however, it  should be noted that the patient had been on a Diazide diuretic for  several days prior to his admission.   On the day after admission, the patient was doing well, was tolerating  p.o.  His IV was discontinued, and he was showing good progress with the  symptoms with good urine output.  The patient expressed a  desire to  establish medical care here at [facility] with Dr. Norton Blizzard.  The patient was accordingly deemed appropriate for discharge.   SIGNIFICANT FINDINGS:  Creatinine on admission was 4.52, decreased to  2.09 the day after admission after fluid bolus.  A urinalysis showed  specific gravity of 1.017; was otherwise negative.  Cholesterol panel:  Total cholesterol 198, triglycerides 312, LDL 103, HDL 33.  Hemoglobin  A1c 5.5.  TSH 1.176.  Serum sodium 139, serum potassium 3.5, chloride  110, CO2 of 23, BUN 75.  Glucose random was 100.   DISCHARGE MEDICATIONS:  None.   DISCHARGE INSTRUCTIONS:  1. The patient is to follow up with Dr. Norton Blizzard on Tuesday,      September 30, at 3:30 in the afternoon.  2. The patient is to maintain frequent p.o. intake of liquids.  3. The patient is to return for any changes in mental status, chest      pain, shortness of breath, or any other concerning symptoms.   CONDITION ON DISCHARGE:  Excellent, stable.      Myrtie Soman, MD  Electronically Signed      Pearlean Brownie, M.D.  Electronically Signed    TE/MEDQ  D:  06/20/2007  T:  06/20/2007  Job:  [id number]

## 2011-02-28 ENCOUNTER — Other Ambulatory Visit: Payer: Self-pay | Admitting: Family Medicine

## 2011-02-28 NOTE — Telephone Encounter (Signed)
Refill request

## 2011-03-04 ENCOUNTER — Other Ambulatory Visit: Payer: Self-pay | Admitting: Family Medicine

## 2011-03-04 NOTE — Telephone Encounter (Signed)
Refill request

## 2011-03-29 ENCOUNTER — Other Ambulatory Visit: Payer: Self-pay | Admitting: Family Medicine

## 2011-03-29 NOTE — Telephone Encounter (Signed)
Will ask admin staff to please call pt and let him know that I have given 1 month refill.  He needs to come in for an appointment and recheck.

## 2011-03-29 NOTE — Telephone Encounter (Signed)
Refill request

## 2011-04-03 ENCOUNTER — Other Ambulatory Visit: Payer: Self-pay | Admitting: Family Medicine

## 2011-04-03 NOTE — Telephone Encounter (Signed)
Refill request

## 2011-04-13 ENCOUNTER — Other Ambulatory Visit: Payer: Self-pay | Admitting: Family Medicine

## 2011-04-13 NOTE — Telephone Encounter (Signed)
Refill request

## 2011-04-29 ENCOUNTER — Other Ambulatory Visit: Payer: Self-pay | Admitting: Family Medicine

## 2011-04-29 NOTE — Telephone Encounter (Signed)
Refill request

## 2011-05-06 ENCOUNTER — Other Ambulatory Visit: Payer: Self-pay | Admitting: Family Medicine

## 2011-05-06 MED ORDER — METFORMIN HCL 500 MG PO TABS: 500.0000 mg | ORAL_TABLET | Freq: Two times a day (BID) | ORAL | Status: DC

## 2011-05-13 ENCOUNTER — Other Ambulatory Visit: Payer: Self-pay | Admitting: Family Medicine

## 2011-05-13 NOTE — Telephone Encounter (Signed)
Refill request

## 2011-05-20 ENCOUNTER — Telehealth: Payer: Self-pay | Admitting: Family Medicine

## 2011-05-20 ENCOUNTER — Other Ambulatory Visit: Payer: Self-pay | Admitting: Family Medicine

## 2011-05-20 NOTE — Telephone Encounter (Signed)
Mr. Rosengren sent a request to his pharmacy Wednesday for refill on his Coreg.  Pt only have 2 tabs left.  If you have the request please send refill to pharmacy.  Pt said he needed Prior Auth per pharmacy.

## 2011-05-23 MED ORDER — CARVEDILOL PHOSPHATE ER 20 MG PO CP24: 20.0000 mg | ORAL_CAPSULE | Freq: Every day | ORAL | Status: DC

## 2011-05-23 NOTE — Telephone Encounter (Signed)
rx sent in this am.  Unable to find the prior authorization form.  If this is needed pt is to let me know.

## 2011-05-27 ENCOUNTER — Other Ambulatory Visit: Payer: Self-pay | Admitting: Family Medicine

## 2011-05-27 MED ORDER — AMLODIPINE BESYLATE 10 MG PO TABS: 10.0000 mg | ORAL_TABLET | Freq: Every day | ORAL | Status: DC

## 2011-06-06 ENCOUNTER — Other Ambulatory Visit: Payer: Self-pay | Admitting: Family Medicine

## 2011-06-06 ENCOUNTER — Telehealth: Payer: Self-pay | Admitting: Family Medicine

## 2011-06-06 MED ORDER — PRAVASTATIN SODIUM 40 MG PO TABS: 40.0000 mg | ORAL_TABLET | Freq: Every day | ORAL | Status: DC

## 2011-06-06 NOTE — Telephone Encounter (Signed)
[facility] on [address] sent a refill request form Provastatin Sodium last Friday and has not gotten the approval yet.  Also, [facility] on [address] should be sending a request for Metformin.The patient is calling checking on it.

## 2011-06-06 NOTE — Telephone Encounter (Signed)
Refill request

## 2011-06-06 NOTE — Telephone Encounter (Signed)
Fwd. To PCP for refill. .Leianne Callins  

## 2011-06-27 ENCOUNTER — Ambulatory Visit (INDEPENDENT_AMBULATORY_CARE_PROVIDER_SITE_OTHER): Payer: BC Managed Care – PPO | Admitting: Family Medicine

## 2011-06-27 VITALS — BP 157/91 | HR 66 | Wt 183.0 lb

## 2011-06-27 DIAGNOSIS — E663 Overweight: Secondary | ICD-10-CM

## 2011-06-27 DIAGNOSIS — E782 Mixed hyperlipidemia: Secondary | ICD-10-CM

## 2011-06-27 DIAGNOSIS — N179 Acute kidney failure, unspecified: Secondary | ICD-10-CM

## 2011-06-27 DIAGNOSIS — I10 Essential (primary) hypertension: Secondary | ICD-10-CM

## 2011-06-27 DIAGNOSIS — E119 Type 2 diabetes mellitus without complications: Secondary | ICD-10-CM

## 2011-06-27 DIAGNOSIS — Z719 Counseling, unspecified: Secondary | ICD-10-CM

## 2011-06-27 DIAGNOSIS — Z7189 Other specified counseling: Secondary | ICD-10-CM

## 2011-06-27 LAB — POCT GLYCOSYLATED HEMOGLOBIN (HGB A1C): Hemoglobin A1C: 6

## 2011-06-27 MED ORDER — AMLODIPINE BESYLATE 10 MG PO TABS: 10.0000 mg | ORAL_TABLET | Freq: Every day | ORAL | Status: DC

## 2011-06-27 NOTE — Patient Instructions (Addendum)
For your blood pressure: Continue to walk, even increase the amount of exercise, and continue to avoid salt.  Continue to check blood pressure and write down the numbers and bring to your next appointment.  Return in 1 month for blood pressure recheck  Diabetes: I will sent the A1c result to you in the mail.  Or call if we need to make any med changes.  Check your blood sugar 1 x per week.  Or if you have any symptoms of high or low blood sugar.   Weight loss: Good job you have continued to lose weight.  Keep up the exercise.  Brain storm ways that you can increase your exercise. Goals are to: cut out fried foods, and goal of increasing fruits and vegtables 2-3. (ultimate goal is to increase to 5-9 fruits and vegtables per day)  We can talk about your weight more in 1 month.  Labs: Make a lab appointment on your way out for fasting labs

## 2011-06-29 ENCOUNTER — Other Ambulatory Visit: Payer: BC Managed Care – PPO

## 2011-06-29 DIAGNOSIS — I10 Essential (primary) hypertension: Secondary | ICD-10-CM

## 2011-06-29 DIAGNOSIS — E782 Mixed hyperlipidemia: Secondary | ICD-10-CM

## 2011-06-29 LAB — COMPREHENSIVE METABOLIC PANEL
ALT: 22 U/L (ref 0–53)
AST: 23 U/L (ref 0–37)
Albumin: 4.3 g/dL (ref 3.5–5.2)
Alkaline Phosphatase: 48 U/L (ref 39–117)
BUN: 17 mg/dL (ref 6–23)
Chloride: 106 mEq/L (ref 96–112)
Potassium: 4.1 mEq/L (ref 3.5–5.3)
Sodium: 140 mEq/L (ref 135–145)
Total Protein: 6.9 g/dL (ref 6.0–8.3)

## 2011-06-29 LAB — LIPID PANEL
HDL: 45 mg/dL (ref >39)
LDL Cholesterol: 78 mg/dL (ref 0–99)

## 2011-06-29 NOTE — Progress Notes (Signed)
cmp and flp done today Faithanne Verret 

## 2011-06-30 ENCOUNTER — Encounter: Payer: Self-pay | Admitting: Family Medicine

## 2011-06-30 DIAGNOSIS — Z719 Counseling, unspecified: Secondary | ICD-10-CM | POA: Insufficient documentation

## 2011-06-30 NOTE — Assessment & Plan Note (Signed)
Need to review recommended health maintenance screenings at followup appointment.

## 2011-06-30 NOTE — Assessment & Plan Note (Addendum)
A1c 6.0-in October 2012 On metformin 500 mg twice a day. Patient is past due for eye appointment encouraged to make appointment. Patient does not have any teeth.   On losartan. Foot exam within normal limits-no lesions, no sores, good sensation throughout Prescription given for glucometer

## 2011-06-30 NOTE — Assessment & Plan Note (Signed)
Patient congratulated on recent weight loss, encouraged continued healthy lifestyle changes in diet and exercise.

## 2011-06-30 NOTE — Assessment & Plan Note (Signed)
Will recheck renal function today. 

## 2011-06-30 NOTE — Assessment & Plan Note (Signed)
Will recheck lipid panel today 

## 2011-06-30 NOTE — Assessment & Plan Note (Addendum)
Blood pressure elevated at 157/91-blood pressure goal is 130/80 in the setting of diabetes. Patient states his blood pressure runs up when he gets to office, he checks blood pressure at home and it is in low 100s. Patient to blood pressure machine to followup appointment as well as blood pressure log book. Encouraged continued exercise and weight loss

## 2011-06-30 NOTE — Progress Notes (Signed)
  Subjective:    Patient ID: Brandon Peters, male    DOB: Jun 25, 1956, 55 y.o.   MRN: [id number]  HPI Obesity: Eating to 4 vegetables per day, has cut out fried food, is eating more at home as opposed eating out, patient's goal weight is 155, has lost 5 pounds since September-current weight is 183. Walking 2-3 times per week for 45 minutes  Hypertension:  blood pressure currently 157/91. Patient states he checks his blood pressure at home and it usually is in the low 100s systolic. Patient did not bring blood pressure log book with him today.  Diabetes:  last A1c in September was 6.2, not taking blood glucose, does not have glucometer at home  Labs: Patient requesting yearly labs checked his kidneys and lipid panel.     Review of Systems No chest pain. No shortness of breath. No fever.    Objective:   Physical Exam  Constitutional: He is oriented to person, place, and time. He appears well-developed and well-nourished.       Overweight  HENT:  Head: Normocephalic and atraumatic.  Cardiovascular: Normal rate, regular rhythm and normal heart sounds.   No murmur heard. Pulmonary/Chest: Effort normal and breath sounds normal. No respiratory distress. He has no wheezes.  Abdominal: Soft. He exhibits no distension. There is no tenderness.  Musculoskeletal: Normal range of motion. He exhibits no edema.  Neurological: He is alert and oriented to person, place, and time.  Skin: No rash noted.  Psychiatric: He has a normal mood and affect. His behavior is normal.   Foot exam within normal limits-no lesions, no sores, good sensation throughout       Assessment & Plan:

## 2011-07-04 ENCOUNTER — Other Ambulatory Visit: Payer: Self-pay | Admitting: Family Medicine

## 2011-07-04 NOTE — Telephone Encounter (Signed)
Refill request

## 2011-07-05 ENCOUNTER — Other Ambulatory Visit: Payer: Self-pay | Admitting: Family Medicine

## 2011-07-05 MED ORDER — METFORMIN HCL 500 MG PO TABS: 500.0000 mg | ORAL_TABLET | Freq: Two times a day (BID) | ORAL | Status: DC

## 2011-07-07 LAB — URINALYSIS, ROUTINE W REFLEX MICROSCOPIC
Bilirubin Urine: NEGATIVE
Ketones, ur: NEGATIVE
Nitrite: NEGATIVE
Protein, ur: NEGATIVE

## 2011-07-07 LAB — BASIC METABOLIC PANEL
BUN: 75 — ABNORMAL HIGH
Calcium: 8.5
Creatinine, Ser: 2.09 — ABNORMAL HIGH
GFR calc non Af Amer: 34 — ABNORMAL LOW
Glucose, Bld: 100 — ABNORMAL HIGH
Sodium: 139

## 2011-07-07 LAB — LIPID PANEL
HDL: 33 — ABNORMAL LOW
Total CHOL/HDL Ratio: 6
Triglycerides: 312 — ABNORMAL HIGH

## 2011-07-07 LAB — SODIUM, URINE, RANDOM: Sodium, Ur: 94

## 2011-07-07 LAB — TSH: TSH: 1.176

## 2011-07-08 ENCOUNTER — Other Ambulatory Visit: Payer: Self-pay | Admitting: Family Medicine

## 2011-07-08 NOTE — Telephone Encounter (Signed)
Refill request

## 2011-07-28 ENCOUNTER — Other Ambulatory Visit: Payer: Self-pay | Admitting: Family Medicine

## 2011-07-28 NOTE — Telephone Encounter (Signed)
Refill request

## 2011-08-01 ENCOUNTER — Encounter: Payer: Self-pay | Admitting: Family Medicine

## 2011-08-01 ENCOUNTER — Ambulatory Visit (INDEPENDENT_AMBULATORY_CARE_PROVIDER_SITE_OTHER): Payer: BC Managed Care – PPO | Admitting: Family Medicine

## 2011-08-01 DIAGNOSIS — E663 Overweight: Secondary | ICD-10-CM

## 2011-08-01 DIAGNOSIS — I10 Essential (primary) hypertension: Secondary | ICD-10-CM

## 2011-08-01 NOTE — Progress Notes (Signed)
  Subjective:    Patient ID: Brandon Peters, male    DOB: 07-Aug-1956, 55 y.o.   MRN: [id number]  HPI Blood pressure: Patient here to followup on hypertension. Taking all medications as directed. Walking 3-5 times per week for possible one hour at the mall. Blood pressure log-that he checks at [facility] after walking-shows that blood pressure is well controlled at home. On October 3-114/73, on October 23-108/71, on November 1 in the evening-127/81. Patient states his blood pressure always increases when he comes to the doctor's office. Blood pressure recheck in the office visit remain 150/90.  Obesity: Patient states that he is walking as per above. He states he still struggles with his diet. He likes to eat fried foods-but has been working to decrease this. He states that his weight is better controlled when he home and he continues to eat out frequently. He states that he knows what he needs to do. He has met with the diabetes nutritionist in the past. He states that this was helpful. States that he knows how to carb count. Doesn't feel he needs nutrition consult at this time. He just needs get motivated to improve nutrition. Patient states his biggest barrier to healthier diet is that he is simply procrastinating in making this change.   Review of Systems  No dizziness. No chest pain. No shortness of breath.    Objective:   Physical Exam  Constitutional: He is oriented to person, place, and time. He appears well-nourished. No distress.       Obese  HENT:  Head: Normocephalic and atraumatic.  Cardiovascular: Normal rate.   Pulmonary/Chest: Effort normal. No respiratory distress.  Abdominal: Soft. He exhibits no distension.  Musculoskeletal: He exhibits no edema.       Gait normal  Neurological: He is alert and oriented to person, place, and time.  Skin: Skin is warm and dry.  Psychiatric: He has a normal mood and affect. His behavior is normal.          Assessment & Plan:

## 2011-08-01 NOTE — Patient Instructions (Signed)
Return in 2 months for weight check- make an appointment with the nurse. Return in 4 months for your next MD visit for [place] to follow up on weight loss.

## 2011-08-02 ENCOUNTER — Other Ambulatory Visit: Payer: Self-pay | Admitting: Family Medicine

## 2011-08-03 NOTE — Telephone Encounter (Signed)
Refill request

## 2011-08-11 NOTE — Assessment & Plan Note (Addendum)
Home blood pressure log shows blood pressures at goal.  October 3-114/73, on October 23-108/71, on November 1 in the evening-127/81.   Patient most likely has whitecoat hypertension. Patient to continue to exercise number on weight loss. Patient also to monitor blood pressure closely. Patient to bring blood pressure machine to next appointment so we can check to make sure this is accurate. We'll not make any changes in blood pressure medicine regimen at this time.

## 2011-08-11 NOTE — Assessment & Plan Note (Signed)
Patient did have some weight gain since last appointment. He reports that his daughter will exercise but still struggles with healthy diet. Does not want to see nutritionist, states that he knows what to do he just has to do with. Patient to return in 2 months for weight check with nurse. Patient to return in 4 months for followup M.D. appointment on weight loss.

## 2011-10-25 ENCOUNTER — Telehealth: Payer: Self-pay | Admitting: Family Medicine

## 2011-10-25 NOTE — Telephone Encounter (Signed)
Patient dropped off information for his prescriptions to be faxed to [facility].

## 2011-10-27 ENCOUNTER — Other Ambulatory Visit: Payer: Self-pay | Admitting: Family Medicine

## 2011-10-27 MED ORDER — LOSARTAN POTASSIUM 25 MG PO TABS: 25.0000 mg | ORAL_TABLET | Freq: Every day | ORAL | Status: DC

## 2011-10-27 MED ORDER — PRAVASTATIN SODIUM 40 MG PO TABS: 40.0000 mg | ORAL_TABLET | Freq: Every day | ORAL | Status: DC

## 2011-10-27 MED ORDER — METFORMIN HCL 500 MG PO TABS: 500.0000 mg | ORAL_TABLET | Freq: Two times a day (BID) | ORAL | Status: DC

## 2011-10-27 MED ORDER — AMLODIPINE BESYLATE 10 MG PO TABS: 10.0000 mg | ORAL_TABLET | Freq: Every day | ORAL | Status: DC

## 2011-10-27 MED ORDER — CARVEDILOL PHOSPHATE ER 20 MG PO CP24: 20.0000 mg | ORAL_CAPSULE | Freq: Every day | ORAL | Status: DC

## 2011-10-27 NOTE — Telephone Encounter (Signed)
rx's refilled as requested and will be faxed today.

## 2013-05-08 ENCOUNTER — Telehealth: Payer: Self-pay | Admitting: *Deleted

## 2013-05-08 NOTE — Telephone Encounter (Signed)
Letter sent regarding diabetes follow up care Elizabeth Izaha Shughart, RN-BSN   

## 2014-12-15 ENCOUNTER — Telehealth: Payer: Self-pay | Admitting: *Deleted

## 2014-12-15 NOTE — Telephone Encounter (Signed)
Attempted to scheduled pt for a diabetic appt, pt declined. Brandon Peters, CMA

## 2015-03-02 ENCOUNTER — Telehealth: Payer: Self-pay | Admitting: *Deleted

## 2015-03-02 NOTE — Telephone Encounter (Signed)
Spoke with pt about coming in for an appt and he declined. Deseree Kennon Holter, CMA

## 2016-03-03 ENCOUNTER — Encounter (HOSPITAL_COMMUNITY): Payer: Self-pay | Admitting: Emergency Medicine

## 2016-03-03 ENCOUNTER — Inpatient Hospital Stay (HOSPITAL_COMMUNITY)
Admission: EM | Admit: 2016-03-03 | Discharge: 2016-03-06 | DRG: 374 | Disposition: A | Discharge status: Home / Self Care | Payer: Self-pay | Attending: Internal Medicine | Admitting: Internal Medicine

## 2016-03-03 ENCOUNTER — Emergency Department (HOSPITAL_COMMUNITY): Payer: MEDICAID

## 2016-03-03 DIAGNOSIS — R600 Localized edema: Secondary | ICD-10-CM

## 2016-03-03 DIAGNOSIS — E876 Hypokalemia: Secondary | ICD-10-CM | POA: Diagnosis not present

## 2016-03-03 DIAGNOSIS — D61818 Other pancytopenia: Secondary | ICD-10-CM | POA: Diagnosis present

## 2016-03-03 DIAGNOSIS — D124 Benign neoplasm of descending colon: Secondary | ICD-10-CM | POA: Diagnosis present

## 2016-03-03 DIAGNOSIS — C782 Secondary malignant neoplasm of pleura: Secondary | ICD-10-CM | POA: Diagnosis present

## 2016-03-03 DIAGNOSIS — E785 Hyperlipidemia, unspecified: Secondary | ICD-10-CM | POA: Diagnosis present

## 2016-03-03 DIAGNOSIS — N139 Obstructive and reflux uropathy, unspecified: Secondary | ICD-10-CM | POA: Diagnosis present

## 2016-03-03 DIAGNOSIS — C786 Secondary malignant neoplasm of retroperitoneum and peritoneum: Principal | ICD-10-CM | POA: Diagnosis present

## 2016-03-03 DIAGNOSIS — D1339 Benign neoplasm of other parts of small intestine: Secondary | ICD-10-CM

## 2016-03-03 DIAGNOSIS — D500 Iron deficiency anemia secondary to blood loss (chronic): Secondary | ICD-10-CM | POA: Diagnosis present

## 2016-03-03 DIAGNOSIS — N2889 Other specified disorders of kidney and ureter: Secondary | ICD-10-CM | POA: Diagnosis present

## 2016-03-03 DIAGNOSIS — Z7984 Long term (current) use of oral hypoglycemic drugs: Secondary | ICD-10-CM

## 2016-03-03 DIAGNOSIS — K6389 Other specified diseases of intestine: Secondary | ICD-10-CM

## 2016-03-03 DIAGNOSIS — R1909 Other intra-abdominal and pelvic swelling, mass and lump: Secondary | ICD-10-CM | POA: Diagnosis present

## 2016-03-03 DIAGNOSIS — R634 Abnormal weight loss: Secondary | ICD-10-CM

## 2016-03-03 DIAGNOSIS — Z7982 Long term (current) use of aspirin: Secondary | ICD-10-CM

## 2016-03-03 DIAGNOSIS — E1165 Type 2 diabetes mellitus with hyperglycemia: Secondary | ICD-10-CM

## 2016-03-03 DIAGNOSIS — Z880 Allergy status to penicillin: Secondary | ICD-10-CM

## 2016-03-03 DIAGNOSIS — I10 Essential (primary) hypertension: Secondary | ICD-10-CM

## 2016-03-03 DIAGNOSIS — E8809 Other disorders of plasma-protein metabolism, not elsewhere classified: Secondary | ICD-10-CM

## 2016-03-03 DIAGNOSIS — E43 Unspecified severe protein-calorie malnutrition: Secondary | ICD-10-CM | POA: Insufficient documentation

## 2016-03-03 DIAGNOSIS — C801 Malignant (primary) neoplasm, unspecified: Secondary | ICD-10-CM

## 2016-03-03 DIAGNOSIS — Z79899 Other long term (current) drug therapy: Secondary | ICD-10-CM

## 2016-03-03 DIAGNOSIS — C7952 Secondary malignant neoplasm of bone marrow: Secondary | ICD-10-CM | POA: Diagnosis present

## 2016-03-03 DIAGNOSIS — Z6825 Body mass index (BMI) 25.0-25.9, adult: Secondary | ICD-10-CM

## 2016-03-03 DIAGNOSIS — C61 Malignant neoplasm of prostate: Secondary | ICD-10-CM

## 2016-03-03 DIAGNOSIS — D649 Anemia, unspecified: Secondary | ICD-10-CM

## 2016-03-03 LAB — CBC
HEMATOCRIT: 13.1 % — AB (ref 39.0–52.0)
HEMOGLOBIN: 4 g/dL — AB (ref 13.0–17.0)
MCH: 29.4 pg (ref 26.0–34.0)
MCHC: 30.5 g/dL (ref 30.0–36.0)
MCV: 96.3 fL (ref 78.0–100.0)
Platelets: 277 10*3/uL (ref 150–400)
RBC: 1.36 MIL/uL — AB (ref 4.22–5.81)
RDW: 17 % — ABNORMAL HIGH (ref 11.5–15.5)
WBC: 5.2 10*3/uL (ref 4.0–10.5)

## 2016-03-03 LAB — COMPREHENSIVE METABOLIC PANEL
ALT: 11 U/L — ABNORMAL LOW (ref 17–63)
AST: 16 U/L (ref 15–41)
Albumin: 2.8 g/dL — ABNORMAL LOW (ref 3.5–5.0)
Alkaline Phosphatase: 267 U/L — ABNORMAL HIGH (ref 38–126)
Anion gap: 9 (ref 5–15)
BUN: 13 mg/dL (ref 6–20)
CHLORIDE: 104 mmol/L (ref 101–111)
CO2: 23 mmol/L (ref 22–32)
Calcium: 9.1 mg/dL (ref 8.9–10.3)
Creatinine, Ser: 0.73 mg/dL (ref 0.61–1.24)
Glucose, Bld: 378 mg/dL — ABNORMAL HIGH (ref 65–99)
POTASSIUM: 3.9 mmol/L (ref 3.5–5.1)
Sodium: 136 mmol/L (ref 135–145)
Total Bilirubin: 0.5 mg/dL (ref 0.3–1.2)
Total Protein: 6.8 g/dL (ref 6.5–8.1)

## 2016-03-03 LAB — FERRITIN: Ferritin: 724 ng/mL — ABNORMAL HIGH (ref 24–336)

## 2016-03-03 LAB — RETICULOCYTES
RBC.: 1.75 MIL/uL — ABNORMAL LOW (ref 4.22–5.81)
RETIC COUNT ABSOLUTE: 36.8 10*3/uL (ref 19.0–186.0)
RETIC CT PCT: 2.1 % (ref 0.4–3.1)

## 2016-03-03 LAB — POC OCCULT BLOOD, ED: FECAL OCCULT BLD: POSITIVE — AB

## 2016-03-03 LAB — PREPARE RBC (CROSSMATCH)

## 2016-03-03 LAB — GLUCOSE, CAPILLARY
GLUCOSE-CAPILLARY: 249 mg/dL — AB (ref 65–99)
GLUCOSE-CAPILLARY: 207 mg/dL — AB (ref 65–99)

## 2016-03-03 LAB — ABO/RH: ABO/RH(D): O POS

## 2016-03-03 MED ORDER — INSULIN ASPART 100 UNIT/ML ~~LOC~~ SOLN
0.0000 [IU] | Freq: Three times a day (TID) | SUBCUTANEOUS | Status: DC
  Administered 2016-03-03: 3 [IU] via SUBCUTANEOUS
  Administered 2016-03-04: 1 [IU] via SUBCUTANEOUS
  Administered 2016-03-04: 3 [IU] via SUBCUTANEOUS
  Administered 2016-03-05: 1 [IU] via SUBCUTANEOUS
  Administered 2016-03-06: 2 [IU] via SUBCUTANEOUS

## 2016-03-03 MED ORDER — PEG 3350-KCL-NA BICARB-NACL 420 G PO SOLR
4000.0000 mL | Freq: Once | ORAL | Status: AC
  Administered 2016-03-03: 4000 mL via ORAL
  Filled 2016-03-03: qty 4000

## 2016-03-03 MED ORDER — SODIUM CHLORIDE 0.9 % IV SOLN
INTRAVENOUS | Status: DC
  Administered 2016-03-04 (×2): via INTRAVENOUS

## 2016-03-03 MED ORDER — ACETAMINOPHEN 500 MG PO TABS
1000.0000 mg | ORAL_TABLET | Freq: Four times a day (QID) | ORAL | Status: DC | PRN
  Administered 2016-03-03: 1000 mg via ORAL
  Filled 2016-03-03: qty 2

## 2016-03-03 MED ORDER — SODIUM CHLORIDE 0.9 % IV SOLN: 10.0000 mL/h | Freq: Once | INTRAVENOUS | Status: DC

## 2016-03-03 MED ORDER — PANTOPRAZOLE SODIUM 40 MG PO TBEC
40.0000 mg | DELAYED_RELEASE_TABLET | Freq: Every day | ORAL | Status: DC
  Administered 2016-03-03 – 2016-03-06 (×4): 40 mg via ORAL
  Filled 2016-03-03 (×4): qty 1

## 2016-03-03 MED ORDER — SODIUM CHLORIDE 0.9% FLUSH
3.0000 mL | Freq: Two times a day (BID) | INTRAVENOUS | Status: DC
  Administered 2016-03-04 – 2016-03-06 (×6): 3 mL via INTRAVENOUS

## 2016-03-03 NOTE — Consult Note (Signed)
Reason for Consult: Symptomatic anemia Referring Physician: Teaching Service  Acquanetta Belling HPI: This is a 60 year old male with a PMH of HTN, hyperlipidemia, and DM admitted for symptomatic anemia.  He was identified to have an HGB at 4.0 with an MCV of 96.  His prior HGB level was in 2010 with a reported value of 12.2 g/dL and an MCV of 90.  Since December the patient reports easy fatigability and an unintentional weight loss of 35-40 lbs.  He has not seen a physician in 4 years as he lost his job.  In 2010 he was evaluated in the office for a routine screening colonoscopy, but he cancelled the procedure secondary to a scheduling conflict.  Unfortunately he did not reschedule.  In the ER he was identified to be heme positive, but he denies any evidence of melena or hematochezia.  There is no abdominal imaging and his AP is noted to be markedly elevated at 267 and an albumin at 2.8.  Finally, the patient denies using any NSAIDs outside of an 81 mg ASA.  Past Medical History  Diagnosis Date  . RENAL CALCULUS, HX OF 06/26/2007  . ARF (acute renal failure) (HCC)     d/t lisinopril and/or diazide- was on both in a 2 week period- cr up to 4.99  . Hypertension   . Hyperlipidemia   . Diabetes mellitus     History reviewed. No pertinent past surgical history.  Family History  Problem Relation Age of Onset  . Diabetes Mother   . Hypertension Mother   . Coronary artery disease Mother   . Alcohol abuse Father   . Hypertension Sister     Social History:  reports that he has never smoked. He has never used smokeless tobacco. He reports that he does not drink alcohol or use illicit drugs.  Allergies:  Allergies  Allergen Reactions  . Penicillins Other (See Comments)    intolerance    Medications:  Scheduled: . insulin aspart  0-9 Units Subcutaneous TID WC  . pantoprazole  40 mg Oral Daily  . sodium chloride flush  3 mL Intravenous Q12H   Continuous:   Results for orders placed or  performed during the hospital encounter of 03/03/16 (from the past 24 hour(s))  Type and screen Laurie     Status: None (Preliminary result)   Collection Time: 03/03/16  1:14 PM  Result Value Ref Range   ABO/RH(D) O POS    Antibody Screen NEG    Sample Expiration 03/06/2016    Unit Number [id number]    Blood Component Type RED CELLS,LR    Unit division 00    Status of Unit ALLOCATED    Transfusion Status OK TO TRANSFUSE    Crossmatch Result Compatible    Unit Number [id number]    Blood Component Type RED CELLS,LR    Unit division 00    Status of Unit ISSUED    Transfusion Status OK TO TRANSFUSE    Crossmatch Result Compatible    Unit Number [id number]    Blood Component Type RED CELLS,LR    Unit division 00    Status of Unit ALLOCATED    Transfusion Status OK TO TRANSFUSE    Crossmatch Result Compatible   ABO/Rh     Status: None   Collection Time: 03/03/16  1:14 PM  Result Value Ref Range   ABO/RH(D) O POS   Comprehensive metabolic panel     Status: Abnormal   Collection Time: 03/03/16  1:27 PM  Result Value Ref Range   Sodium 136 135 - 145 mmol/L   Potassium 3.9 3.5 - 5.1 mmol/L   Chloride 104 101 - 111 mmol/L   CO2 23 22 - 32 mmol/L   Glucose, Bld 378 (H) 65 - 99 mg/dL   BUN 13 6 - 20 mg/dL   Creatinine, Ser 0.73 0.61 - 1.24 mg/dL   Calcium 9.1 8.9 - 10.3 mg/dL   Total Protein 6.8 6.5 - 8.1 g/dL   Albumin 2.8 (L) 3.5 - 5.0 g/dL   AST 16 15 - 41 U/L   ALT 11 (L) 17 - 63 U/L   Alkaline Phosphatase 267 (H) 38 - 126 U/L   Total Bilirubin 0.5 0.3 - 1.2 mg/dL   GFR calc non Af Amer >60 >60 mL/min   GFR calc Af Amer >60 >60 mL/min   Anion gap 9 5 - 15  CBC     Status: Abnormal   Collection Time: 03/03/16  1:27 PM  Result Value Ref Range   WBC 5.2 4.0 - 10.5 K/uL   RBC 1.36 (L) 4.22 - 5.81 MIL/uL   Hemoglobin 4.0 (LL) 13.0 - 17.0 g/dL   HCT 13.1 (L) 39.0 - 52.0 %   MCV 96.3 78.0 - 100.0 fL   MCH 29.4 26.0 - 34.0 pg   MCHC 30.5 30.0 -  36.0 g/dL   RDW 17.0 (H) 11.5 - 15.5 %   Platelets 277 150 - 400 K/uL  POC occult blood, ED     Status: Abnormal   Collection Time: 03/03/16  2:40 PM  Result Value Ref Range   Fecal Occult Bld POSITIVE (A) NEGATIVE  Prepare RBC     Status: None   Collection Time: 03/03/16  2:40 PM  Result Value Ref Range   Order Confirmation ORDER PROCESSED BY BLOOD BANK   Glucose, capillary     Status: Abnormal   Collection Time: 03/03/16  4:59 PM  Result Value Ref Range   Glucose-Capillary 249 (H) 65 - 99 mg/dL     No results found.  ROS:  As stated above in the HPI otherwise negative.  Blood pressure 169/85, pulse 102, temperature 97.9 F (36.6 C), temperature source Oral, resp. rate 20, height 5\' 5"  (1.651 m), weight 70.67 kg (155 lb 12.8 oz), SpO2 100 %.    PE: Gen: NAD, Alert and Oriented HEENT:  [place]/AT, EOMI Neck: Supple, no LAD Lungs: CTA Bilaterally CV: RRR without M/G/R ABM: Soft, NTND, +BS Ext: No C/C/E  Assessment/Plan: 1) Symptomatic anemia. 2) Heme positive stool. 3) Elevated AP. 4) Unintentional weight loss. 5) DM.   It is interesting that his MCV is not lower with the significant anemia.  This may be suggestive that his bleeding has been more acute, in relative terms.  Further evaluation with an EGD/Colonoscopy is required.  Also, with the weight loss and elevated AP, I am concerned about a malignant process.  After the the endoscopic procedures he will most likely require an imaging scan.  Plan: 1) EGD/Colonoscopy tomorrow. 2) Continue with blood transfusions.   Amiayah Giebel D 03/03/2016, 5:25 PM

## 2016-03-03 NOTE — ED Notes (Addendum)
Sent here from Dr. Macky Lower office after having blood drrawn yesterday-- told was anemic and needed a transfusion. Denies bloody stools, no vomiting.  Hgb=4 (from dr's office)

## 2016-03-03 NOTE — ED Notes (Signed)
PT tolerating blood transfusion with no adverse effects at this time. PT has call bell in reach and two family members at bedside

## 2016-03-03 NOTE — H&P (Signed)
Date: 03/03/2016               Patient Name:  Brandon Peters MRN: [id number]  DOB: 1956/05/29 Age / Sex: 60 y.o., male   PCP: No primary care provider on file.              Medical Service: Internal Medicine Teaching Service              Attending Physician: Dr. Sid Falcon, MD         First Contact: Dr. Vernelle Emerald Pager: [phone]  Second Contact Dr. Julious Oka Pager: [phone]       After Hours (After 5p/  First Contact Pager: [phone]  weekends / holidays): Second Contact Pager: [phone]   Chief Complaint: Fatigue and shortness of breath  History of Present Illness:  Brandon Peters is a 60 year old man with history of type II diabetes mellitus and hypertension who presents for a 34-month history of progressive fatigue and shortness of breath. He says beginning at the end of December he has felt weak and had shortness of breath on minimal exertion that have gotten much worse to the point he has difficulty showering and walking to get his mail. He needs help doing housework. He says when he stands up he will get heart palpitations and has also noticed swelling in his legs recently. He has not taken anything to alleviate his symptoms and says rest is the only thing that will help. He has also noted weight loss of about 45 lb since December and decreased appetite. He has not had nausea, vomiting, or diarrhea, though he has had a decrease in bowel movements. He has not noticed blood in his stool or black, tarry stool. He denies dizziness, chest pain, and abdominal pain. He lost his job 4 years ago and has not been to a doctor or taking medications since then.     In the ED he was found to have a hemoglobin of 4.0 and POC occult blood positive. He was typed and screened and given 3 U pRBCs.  Meds: Current Facility-Administered Medications  Medication Dose Route Frequency Provider Last Rate Last Dose  . 0.9 %  sodium chloride infusion  10 mL/hr Intravenous Once Shela Leff, MD        Current Outpatient Prescriptions  Medication Sig Dispense Refill  . aspirin 81 MG EC tablet Take 81 mg by mouth daily.      Marland Kitchen amLODipine (NORVASC) 10 MG tablet Take 1 tablet (10 mg total) by mouth daily. (Patient not taking: Reported on 03/03/2016) 90 tablet 11  . carvedilol (COREG CR) 20 MG 24 hr capsule Take 1 capsule (20 mg total) by mouth daily. (Patient not taking: Reported on 03/03/2016) 30 capsule 11  . losartan (COZAAR) 25 MG tablet Take 1 tablet (25 mg total) by mouth daily. (Patient not taking: Reported on 03/03/2016) 30 tablet 11  . metFORMIN (GLUCOPHAGE) 500 MG tablet Take 1 tablet (500 mg total) by mouth 2 (two) times daily with a meal. (Patient not taking: Reported on 03/03/2016) 60 tablet 11  . pravastatin (PRAVACHOL) 40 MG tablet Take 1 tablet (40 mg total) by mouth daily. (Patient not taking: Reported on 03/03/2016) 30 tablet 11    Allergies: Allergies as of 03/03/2016 - Review Complete 03/03/2016  Allergen Reaction Noted  . Penicillins Other (See Comments) 06/27/2011   Past Medical History  Diagnosis Date  . RENAL CALCULUS, HX OF 06/26/2007  . ARF (acute renal failure) (HCC)     d/t  lisinopril and/or diazide- was on both in a 2 week period- cr up to 4.99  . Hypertension   . Hyperlipidemia   . Diabetes mellitus    History reviewed. No pertinent past surgical history. Family History  Problem Relation Age of Onset  . Diabetes Mother   . Hypertension Mother   . Coronary artery disease Mother   . Alcohol abuse Father   . Hypertension Sister    Social History   Social History  . Marital Status: Single    Spouse Name: N/A  . Number of Children: N/A  . Years of Education: N/A   Occupational History  . Not on file.   Social History Main Topics  . Smoking status: Never Smoker   . Smokeless tobacco: Never Used  . Alcohol Use: No  . Drug Use: No  . Sexual Activity: Not on file   Other Topics Concern  . 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 Never married. Took college classes at [employer] --.got a electrician degree from [place]: Constitutional: pertinent positives in HPI HENT: Change in taste. No headaches. Eyes: No vision changes. CV: pertinent positives in HPI Pulm: pertinent positives in HPI GI: pertinent positives in HPI GU: No dysuria or polyuria. Neuro: No numbness or paresthesias. Skin: No rashes or changes in skin color noted.  Physical Exam: Blood pressure 146/78, pulse 104, temperature 98.2 F (36.8 C), temperature source Oral, resp. rate 18, height 5\' 5"  (1.651 m), weight 70.308 kg (155 lb), SpO2 100 %. General: Laying in bed. In no apparent distress. Pale. HENT: Moist mucous membranes. Pale tongue. Eyes: Pale conjunctiva. PERRL CV: RRR. Normal S1/S2. Grade II decrescendo systolic murmur radiating to carotids. DP/PT pulses, radial pulses intact. Pulm: Clear to auscultation bilaterally. No wheezes, rales, or rhonchi. Abdomen: Soft, nontender, nondistended. Positive bowel sounds. Lymphatics: No lymphadenopathy in neck or axillae. Ext: Significant pitting edema in foot to mid-calf. Skin: Flaking and hairless in lower half of leg. No rashes.  Lab results: Basic Metabolic Panel:  Recent Labs  03/03/16 1327  NA 136  K 3.9  CL 104  CO2 23  GLUCOSE 378*  BUN 13  CREATININE 0.73  CALCIUM 9.1   Liver Function Tests:  Recent Labs  03/03/16 1327  AST 16  ALT 11*  ALKPHOS 267*  BILITOT 0.5  PROT 6.8  ALBUMIN 2.8*   CBC:  Recent Labs  03/03/16 1327  WBC 5.2  HGB 4.0*  HCT 13.1*  MCV 96.3  PLT 277   Assessment & Plan by Problem: Active Problems:   Symptomatic anemia  Brandon Peters is a 60 year old man with type II diabetes mellitus and hypertension who presents for progressive fatigue and shortness of breath, likely the result of anemia.  Lightheadedness and fatigue: Anemia (Hg of 4.0 on presentation) is  most likely source of shortness of breath and fatigue. Possibly from GI malignancy given weight loss, though will still consider peptic ulcer disease. Lack of jaundice and low-normal LFTs argue against hemolysis. Orthostatic hypotension may also be contributing to lightheadedness given tachycardia upon standing, though he does not appear volume depleted. Orthostatic hypotension tends to be seen in an older demographic, but his untreated diabetes may be contributing to vascular disease that is leading to his symptoms. The skin changes seen on his legs supports diabetes involvement. His lack of appetite may have also led to  a nutritional deficiency, such as lack of iron, that may be causing the chronic anemia. - Reticulocyte count to confirm anemia due to blood loss. - Ferritin - Consult GI. Colonoscopy tomorrow. Clears at midnight. - Pantoprazole 40 mg, daily - Urinalysis  Diabetes mellitus: Has not been on medication in over 4 years. Hyperglycemic upon presentation. 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 His most recent creatinine 0.73 and GFR >60. - Sliding scale insulin  Hypertension: Has not been on medication in over 4 years. Will continue transfusion today and reevaluate blood pressure tomorrow am before administering medication.   This is a Careers information officer Note.  The care of the patient was discussed with Dr. Vernelle Emerald and the assessment and plan was formulated with their assistance.  Please see their note for official documentation of the patient encounter.   Signed: Cheron Every, Med Student 03/03/2016, 4:04 PM

## 2016-03-03 NOTE — ED Notes (Signed)
Call to 6E to give report

## 2016-03-03 NOTE — ED Provider Notes (Signed)
CSN: [id number]     Arrival date & time 03/03/16  1215 History   None    Chief Complaint  Patient presents with  . Anemia     (Consider location/radiation/quality/duration/timing/severity/associated sxs/prior Treatment) HPI  Patient is a 60 yo M with a PMHx of DM2, HTN, HLD, renal calculi presenting to the ED for management of severe anemia. Patient states he has not seen a doctor in 4 years or taken any medications for this time period except Aspirin 81 mg daily. States he went to see the [facility] yesterday and as told he was anemic and had to go to the emergency room. Denies having any prior history of anemia and states he has never received a blood transfusion. Reports having fatigue, lightheadedness, SOB with activity, and palpitations with activty for the past 2-3 months. States he experiences these symptoms with minimal activity such as walking within his house. Denies having any chest pain or syncopal episodes. Denies any symptoms of orthopnea. Reports having loss of appetite for the past 2-3 months and believes he unintentionally lost 45 lbs in the past 6 months. States he weighed between 190-200 lbs around christmas time and weights close to 150 lbs now. Reports eating green vegetables and red meat.   Denies having any fevers or night sweats. Denies having any GERD symptoms, abdominal pain, nausea, vomiting, diarrhea, or constipation. Denies having any hematemesis, hematochezia, or melena. States he never had a colonoscopy or EGD done in the past. Reports having a history of kidney stones but denies having any dysuria or flank pain.  Reports having a dry cough for the past 2-3 months. He is a never smoker. States his mother and maternal uncle both were smokers and had lung cancer after age 55.   Past Medical History  Diagnosis Date  . RENAL CALCULUS, HX OF 06/26/2007  . ARF (acute renal failure) (HCC)     d/t lisinopril and/or diazide- was on both in a 2 week period- cr up to  4.99  . Hypertension   . Hyperlipidemia   . Diabetes mellitus    History reviewed. No pertinent past surgical history. Family History  Problem Relation Age of Onset  . Diabetes Mother   . Hypertension Mother   . Coronary artery disease Mother   . Alcohol abuse Father   . Hypertension Sister    Social History  Substance Use Topics  . Smoking status: Never Smoker   . Smokeless tobacco: Never Used  . Alcohol Use: No    Review of Systems  Constitutional: Positive for appetite change, fatigue and unexpected weight change. Negative for fever and chills.  HENT: Negative for congestion and sore throat.   Eyes: Negative for pain and visual disturbance.  Respiratory: Positive for cough and shortness of breath. Negative for wheezing.   Cardiovascular: Positive for palpitations and leg swelling. Negative for chest pain.  Gastrointestinal: Negative for nausea, vomiting, abdominal pain, diarrhea, constipation and blood in stool.  Genitourinary: Negative for dysuria, hematuria and flank pain.  Musculoskeletal: Negative for myalgias and arthralgias.  Skin: Positive for pallor. Negative for rash.  Neurological: Positive for light-headedness. Negative for syncope, weakness and numbness.    Allergies  Penicillins  Home Medications   Prior to Admission medications   Medication Sig Start Date End Date Taking? Authorizing Provider  amLODipine (NORVASC) 10 MG tablet Take 1 tablet (10 mg total) by mouth daily. 10/27/11   Donnal Moat, MD  aspirin 81 MG EC tablet Take 81 mg by  mouth daily.      Historical Provider, MD  carvedilol (COREG CR) 20 MG 24 hr capsule Take 1 capsule (20 mg total) by mouth daily. 10/27/11   Donnal Moat, MD  losartan (COZAAR) 25 MG tablet Take 1 tablet (25 mg total) by mouth daily. 10/27/11   Donnal Moat, MD  metFORMIN (GLUCOPHAGE) 500 MG tablet Take 1 tablet (500 mg total) by mouth 2 (two) times daily with a meal. 10/27/11   Donnal Moat, MD  pravastatin  (PRAVACHOL) 40 MG tablet Take 1 tablet (40 mg total) by mouth daily. 10/27/11   Dawn Christain [place], MD   BP 151/82 mmHg  Pulse 110  Temp(Src) 98 F (36.7 C) (Oral)  Resp 18  Ht 5\' 5"  (1.651 m)  Wt 70.308 kg  BMI 25.79 kg/m2  SpO2 100% Physical Exam  Constitutional: He is oriented to person, place, and time. He appears well-developed and well-nourished. No distress.  HENT:  Head: Normocephalic and atraumatic.  Mouth/Throat: Oropharynx is clear and moist.  Pale mucous membranes   Eyes: EOM are normal. Pupils are equal, round, and reactive to light.  Conjunctival pallor   Neck: Neck supple. No tracheal deviation present.  Cardiovascular: Normal rate, regular rhythm and intact distal pulses.  Exam reveals no gallop and no friction rub.   Murmur heard. Grade 3/6 systolic murmur best appreciated at the right upper sternal border.   Pulmonary/Chest: Effort normal and breath sounds normal. No respiratory distress. He has no wheezes. He has no rales.  Abdominal: Soft. Bowel sounds are normal. He exhibits no distension and no mass. There is no tenderness. There is no guarding.  Genitourinary: Guaiac positive stool.  Anal skin tags seen Foul smelling dark brown loose stool seen on rectal exam.   Musculoskeletal: He exhibits edema.  +1 pitting edema of bilateral lower extremities   Lymphadenopathy:    He has no cervical adenopathy.  Neurological: He is alert and oriented to person, place, and time.  Skin: Skin is warm and dry. No rash noted. No erythema. There is pallor.  Pallor seen on palms of hands     ED Course  Procedures (including critical care time) Labs Review Labs Reviewed  COMPREHENSIVE METABOLIC PANEL - Abnormal; Notable for the following:    Glucose, Bld 378 (*)    Albumin 2.8 (*)    ALT 11 (*)    Alkaline Phosphatase 267 (*)    All other components within normal limits  CBC - Abnormal; Notable for the following:    RBC 1.36 (*)    Hemoglobin 4.0 (*)    HCT 13.1 (*)     RDW 17.0 (*)    All other components within normal limits  POC OCCULT BLOOD, ED  TYPE AND SCREEN  ABO/RH    Imaging Review No results found. I have personally reviewed and evaluated these images and lab results as part of my medical decision-making.   EKG Interpretation None      MDM   Final diagnoses:  None   Symptomatic anemia Patient is presenting with a 2-3 month history of fatigue, lightheadedness, SOB with activity, and palpitations with minimal activity. Reports having loss of appetite and unintentionally losing 45 lbs in the past 6 months. His Hgb is 4.0 on admission. MCV normal. FOBT +. Patient's symptoms, unintentional weight loss, and positive hemeoccult are highly suspicious for an occult GIB possibly secondary to GI malignancy. He does report having a dry cough for the past 2-3 months but is  never smoker. Patient has +1 bilateral lower extremity edema on exam, as such, heart failure is also on the differential. However, he denies any symptoms of orthopnea. His renal function is normal.  -Transfuse with 3u PRBCs -Spoke to [facility] and they will be admitting the patient under their service for further workup and management.    Shela Leff, MD 03/03/16 [place], MD 03/03/16 [phone]

## 2016-03-03 NOTE — H&P (Signed)
Date: 03/03/2016               Patient Name:  Brandon Peters MRN: [id number]  DOB: 1956/04/16 Age / Sex: 60 y.o., male   PCP: No primary care provider on file.         Medical Service: Internal Medicine Teaching Service         Attending Physician: Dr. Sid Falcon, MD    First Contact: Dr. Benjamine Mola Pager: [phone]  Second Contact: Dr. Hulen Luster Pager: [phone]       After Hours (After 5p/  First Contact Pager: [phone]  weekends / holidays): Second Contact Pager: [phone]   Chief Complaint: Dyspnea on exertion  History of Present Illness: 60 y/o man with PHMx significant for hypertension, type 2 diabetes, and hyperlipidemia presents for a Hgb of 4.0 seen at his doctor's office yesterday with worsening fatigue and dyspnea on exertion. This is insidious in onset and progressive over the past few months he feels since December. His appetite remains fair but he thinks he is eating about half as much as normal due to food not tasting normal any more. He denies noticing any melena or hematochezia. He denies any n/v/d or abdominal pain. He has never had previous colonoscopy but has been seen by Dr. Benson Norway several years ago. He has no history of hemorrhoids or other GI bleeding. He has no cough or shortness of breath at rest and tolerates walking about to his mailbox and back. There is mild swelling in his feet and ankles that he feels is new. Heme positive stool noted in the ED with otherwise unremarkable rectal examination. Blood transfusion started for symptomatic anemia.  He also reports a weight loss of up to 30-40 lbs since December. His last documented weight in our system was 186 lbs in 2012 now weighing 155 lbs. Of note he was previously followed with Dr. [facility] but has not seen them regularly for about 4 years since he lost his job and takes no medicine at home besides a daily aspirin. He was previously on coreg, losartan, amlodipine, and metformin.  He is a never smoker and denies drinking  any alcohol.   Meds: Current Facility-Administered Medications  Medication Dose Route Frequency Provider Last Rate Last Dose  . insulin aspart (novoLOG) injection 0-9 Units  0-9 Units Subcutaneous TID WC Collier Salina, MD      . pantoprazole (PROTONIX) EC tablet 40 mg  40 mg Oral Daily Collier Salina, MD      . sodium chloride flush (NS) 0.9 % injection 3 mL  3 mL Intravenous Q12H Collier Salina, MD        Allergies: Allergies as of 03/03/2016 - Review Complete 03/03/2016  Allergen Reaction Noted  . Penicillins Other (See Comments) 06/27/2011   Past Medical History  Diagnosis Date  . RENAL CALCULUS, HX OF 06/26/2007  . ARF (acute renal failure) (HCC)     d/t lisinopril and/or diazide- was on both in a 2 week period- cr up to 4.99  . Hypertension   . Hyperlipidemia   . Diabetes mellitus    History reviewed. No pertinent past surgical history. Family History  Problem Relation Age of Onset  . Diabetes Mother   . Hypertension Mother   . Coronary artery disease Mother   . Alcohol abuse Father   . Hypertension Sister    Social History   Social History  . Marital Status: Single    Spouse Name: N/A  . Number of  Children: N/A  . Years of Education: N/A   Occupational History  . Not on file.   Social History Main Topics  . Smoking status: Never Smoker   . Smokeless tobacco: Never Used  . Alcohol Use: No  . Drug Use: No  . Sexual Activity: Not on file   Other Topics Concern  . 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 Never married. 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  Eyes: Negative for blurred vision.  Respiratory: Positive for shortness of breath.   Cardiovascular: Positive for leg swelling. Negative for chest pain.  Gastrointestinal: Negative for nausea, abdominal  pain, diarrhea, blood in stool and melena.  Genitourinary: Negative for dysuria and frequency.  Musculoskeletal: Negative for back pain.  Skin: Negative for rash.  Neurological: Negative for dizziness, focal weakness and headaches.  Endo/Heme/Allergies: Does not bruise/bleed easily.  Psychiatric/Behavioral: Negative for substance abuse.   Physical Exam: Blood pressure 168/86, pulse 101, temperature 98.1 F (36.7 C), temperature source Oral, resp. rate 18, height 5\' 5"  (1.651 m), weight 70.67 kg (155 lb 12.8 oz), SpO2 95 %.  GENERAL- alert, co-operative, NAD HEENT- Oral mucosa is moist but with marked pallor, conjunctival pallor, no cervical LAN CARDIAC- Tachycardic, regular rhythm, 2/6 early systolic murmur loudest over LUSB RESP- CTAB, no wheezes or crackles ABDOMEN- Soft, nontender, no guarding or rebound, normoactive bowel sounds present NEURO- No obvious Cr N abnormality, strength upper and lower extremities- 5/5, Sensation intact globally EXTREMITIES- R>L pitting pedal edema less than halfway to shin, stasis dermatitis skin changes over feet and shins, callous present on feet b/l, thick nails SKIN- Warm, dry, shin and feet skin changes as above PSYCH- Normal mood and affect, appropriate thought content and speech  Lab results: Basic Metabolic Panel:  Recent Labs  03/03/16 1327  NA 136  K 3.9  CL 104  CO2 23  GLUCOSE 378*  BUN 13  CREATININE 0.73  CALCIUM 9.1   Liver Function Tests:  Recent Labs  03/03/16 1327  AST 16  ALT 11*  ALKPHOS 267*  BILITOT 0.5  PROT 6.8  ALBUMIN 2.8*   CBC:  Recent Labs  03/03/16 1327  WBC 5.2  HGB 4.0*  HCT 13.1*  MCV 96.3  PLT 277    Other results: EKG pending  Assessment & Plan by Problem: Symptomatic anemia: Fatigue and dyspnea on exertion with severe pallor correlating with severe anemia to 4.0. Progression of his symptoms and weight loss since several months ago is very concerning for possibly a malignant process.  He has never had a colonoscopy. MCV is normal at 96 however which would be somewhat unusual for such prolonged GI blood loss as a cause. Heme+ stool without frank active bleeding. Slight liver function abnormalities in alkphos and hypoalbuminemia. Cannot exclude nephrotic syndrome as cause of low albumin given his history of untreated HTN and T2DM. Vital signs are tachycardic but otherwise stable and on exam does not seem greatly depleted. -Admit to telemetry -Transfuse 3 units pRBCs -Orthostatic vital signs in AM -repeat CBC post transfusion and AM Cmet -12-lead EKG -Pantoprazole 40mg  -Ferritin/retic count r/o production defect -Liquid diet after MN for possible endoscopy -GI consulted for further evaluation, recs appreciated  Unintentional weight loss: 30-40 lb weight loss over what is reportedly the past 6 months. Very  concerning for malignancy in the setting of his anemia. Also has decreased oral intake despite no complaints of nausea or vomiting. Hypoalbuminemia may also be 2/2 malnutrition with decreased oral intake and weight loss. GI consulted for workup as above.  Hypertension: Vital signs are mildly tachycardic and hypertensive off all medications. The tachycardia is an appropriate physiologic response to his anemia. Does not seem much volume down on exam. Given his cardiac murmur plus long loss of follow up and exertional dyspnea cardiac ischemia or AS are considerations as well. However on known disease and will defer trending troponin or echocardiography if vitals stable and symptoms resolved with transfusion alone. -Hold antihypertensives pending further workup and treatment unless severe HTN >180 SBP  Type 2 diabetes mellitus: CBG in 360s. Historically he was well controlled with A1c < 7% on metformin in 2012, unclear his status since then. Will plac on sensitive insulin correction for now. Also he has notable isolated pedal edema and with skin changes on shins and feet. His  hypoalbuminemia could be worsened by nephrotic syndrome rather than hepatic dysfunction although his total protein is WNL at 6.8. -Carb modified diet -SSI-S -CBG monitoring -UA for screen for proteinuria  FULL CODE Diet: Carb mod VTE ppx: SCDs only pending anemia work up  [employer]: Disposition is deferred at this time, awaiting improvement of current medical problems. Anticipated discharge in approximately 1-3 day(s).   The patient does not know have a current PCP (No primary care provider on file.) and does need an [facility] hospital follow-up appointment after discharge.  The patient does not know have transportation limitations that hinder transportation to clinic appointments.  Signed: Collier Salina, MD 03/03/2016, 6:11 PM

## 2016-03-04 ENCOUNTER — Inpatient Hospital Stay (HOSPITAL_COMMUNITY): Payer: Self-pay

## 2016-03-04 ENCOUNTER — Encounter (HOSPITAL_COMMUNITY): Payer: Self-pay | Admitting: *Deleted

## 2016-03-04 ENCOUNTER — Encounter (HOSPITAL_COMMUNITY)
Admission: EM | Disposition: A | Discharge status: Home / Self Care | Payer: Self-pay | Source: Home / Self Care | Attending: Internal Medicine

## 2016-03-04 ENCOUNTER — Inpatient Hospital Stay (HOSPITAL_COMMUNITY): Payer: MEDICAID | Admitting: Anesthesiology

## 2016-03-04 ENCOUNTER — Inpatient Hospital Stay (HOSPITAL_COMMUNITY): Payer: Self-pay | Admitting: Anesthesiology

## 2016-03-04 DIAGNOSIS — Z9889 Other specified postprocedural states: Secondary | ICD-10-CM

## 2016-03-04 DIAGNOSIS — E876 Hypokalemia: Secondary | ICD-10-CM

## 2016-03-04 DIAGNOSIS — E43 Unspecified severe protein-calorie malnutrition: Secondary | ICD-10-CM | POA: Insufficient documentation

## 2016-03-04 HISTORY — PX: COLONOSCOPY: SHX5424

## 2016-03-04 HISTORY — PX: ENTEROSCOPY: SHX5533

## 2016-03-04 LAB — COMPREHENSIVE METABOLIC PANEL
ALBUMIN: 2.8 g/dL — AB (ref 3.5–5.0)
ALT: 10 U/L — ABNORMAL LOW (ref 17–63)
AST: 19 U/L (ref 15–41)
Alkaline Phosphatase: 279 U/L — ABNORMAL HIGH (ref 38–126)
Anion gap: 8 (ref 5–15)
BILIRUBIN TOTAL: 0.7 mg/dL (ref 0.3–1.2)
BUN: 9 mg/dL (ref 6–20)
CHLORIDE: 104 mmol/L (ref 101–111)
CO2: 24 mmol/L (ref 22–32)
Calcium: 9.1 mg/dL (ref 8.9–10.3)
Creatinine, Ser: 0.62 mg/dL (ref 0.61–1.24)
GFR calc Af Amer: >60 mL/min (ref >60)
GFR calc non Af Amer: >60 mL/min (ref >60)
GLUCOSE: 201 mg/dL — AB (ref 65–99)
POTASSIUM: 3.3 mmol/L — AB (ref 3.5–5.1)
Sodium: 136 mmol/L (ref 135–145)
TOTAL PROTEIN: 6.6 g/dL (ref 6.5–8.1)

## 2016-03-04 LAB — CBC
HEMATOCRIT: 25 % — AB (ref 39.0–52.0)
Hemoglobin: 8.3 g/dL — ABNORMAL LOW (ref 13.0–17.0)
MCH: 28.5 pg (ref 26.0–34.0)
MCHC: 32.8 g/dL (ref 30.0–36.0)
MCV: 87.8 fL (ref 78.0–100.0)
Platelets: 225 10*3/uL (ref 150–400)
RBC: 2.88 MIL/uL — ABNORMAL LOW (ref 4.22–5.81)
RDW: 18.2 % — AB (ref 11.5–15.5)
WBC: 5.8 10*3/uL (ref 4.0–10.5)

## 2016-03-04 LAB — TYPE AND SCREEN
ABO/RH(D): O POS
Antibody Screen: NEGATIVE
UNIT DIVISION: 0
UNIT DIVISION: 0
Unit division: 0

## 2016-03-04 LAB — URINALYSIS, ROUTINE W REFLEX MICROSCOPIC
BILIRUBIN URINE: NEGATIVE
GLUCOSE, UA: 500 mg/dL — AB
HGB URINE DIPSTICK: NEGATIVE
Ketones, ur: NEGATIVE mg/dL
Leukocytes, UA: NEGATIVE
Nitrite: NEGATIVE
PH: 5.5 (ref 5.0–8.0)
Protein, ur: NEGATIVE mg/dL
SPECIFIC GRAVITY, URINE: 1.017 (ref 1.005–1.030)

## 2016-03-04 LAB — GAMMA GT: GGT: 18 U/L (ref 7–50)

## 2016-03-04 LAB — GLUCOSE, CAPILLARY
GLUCOSE-CAPILLARY: 132 mg/dL — AB (ref 65–99)
GLUCOSE-CAPILLARY: 131 mg/dL — AB (ref 65–99)
GLUCOSE-CAPILLARY: 154 mg/dL — AB (ref 65–99)
Glucose-Capillary: 206 mg/dL — ABNORMAL HIGH (ref 65–99)

## 2016-03-04 LAB — SURGICAL PCR SCREEN
MRSA, PCR: NEGATIVE
Staphylococcus aureus: POSITIVE — AB

## 2016-03-04 SURGERY — COLONOSCOPY
Anesthesia: Monitor Anesthesia Care

## 2016-03-04 MED ORDER — SPOT INK MARKER SYRINGE KIT
PACK | SUBMUCOSAL | Status: AC
  Filled 2016-03-04: qty 10

## 2016-03-04 MED ORDER — DIATRIZOATE MEGLUMINE & SODIUM 66-10 % PO SOLN
ORAL | Status: AC
  Administered 2016-03-04: 16:00:00
  Filled 2016-03-04: qty 30

## 2016-03-04 MED ORDER — EPINEPHRINE HCL 0.1 MG/ML IJ SOSY
PREFILLED_SYRINGE | INTRAMUSCULAR | Status: AC
  Filled 2016-03-04: qty 10

## 2016-03-04 MED ORDER — EPINEPHRINE HCL 0.1 MG/ML IJ SOSY
PREFILLED_SYRINGE | INTRAMUSCULAR | Status: DC | PRN
  Administered 2016-03-04: 4.5 mL

## 2016-03-04 MED ORDER — MIDAZOLAM HCL 5 MG/5ML IJ SOLN
INTRAMUSCULAR | Status: DC | PRN
  Administered 2016-03-04: 2 mg via INTRAVENOUS

## 2016-03-04 MED ORDER — PROPOFOL 500 MG/50ML IV EMUL
INTRAVENOUS | Status: DC | PRN
  Administered 2016-03-04: 75 ug/kg/min via INTRAVENOUS

## 2016-03-04 MED ORDER — POTASSIUM CHLORIDE CRYS ER 20 MEQ PO TBCR
40.0000 meq | EXTENDED_RELEASE_TABLET | Freq: Once | ORAL | Status: AC
  Administered 2016-03-04: 40 meq via ORAL
  Filled 2016-03-04: qty 2

## 2016-03-04 MED ORDER — PROPOFOL 10 MG/ML IV BOLUS
INTRAVENOUS | Status: DC | PRN
  Administered 2016-03-04: 20 mg via INTRAVENOUS
  Administered 2016-03-04: 10 mg via INTRAVENOUS
  Administered 2016-03-04: 20 mg via INTRAVENOUS
  Administered 2016-03-04 (×3): 10 mg via INTRAVENOUS

## 2016-03-04 MED ORDER — CHLORHEXIDINE GLUCONATE CLOTH 2 % EX PADS
6.0000 | MEDICATED_PAD | Freq: Every day | CUTANEOUS | Status: DC
  Administered 2016-03-04 – 2016-03-06 (×3): 6 via TOPICAL

## 2016-03-04 MED ORDER — IOPAMIDOL (ISOVUE-300) INJECTION 61%
INTRAVENOUS | Status: AC
Start: 2016-03-04 — End: 2016-03-04
  Administered 2016-03-04: 100 mL
  Filled 2016-03-04: qty 100

## 2016-03-04 MED ORDER — MUPIROCIN 2 % EX OINT
1.0000 "application " | TOPICAL_OINTMENT | Freq: Two times a day (BID) | CUTANEOUS | Status: DC
  Administered 2016-03-04 – 2016-03-06 (×4): 1 via NASAL
  Filled 2016-03-04 (×3): qty 22

## 2016-03-04 MED ORDER — ONDANSETRON HCL 4 MG/2ML IJ SOLN
INTRAMUSCULAR | Status: DC | PRN
  Administered 2016-03-04: 4 mg via INTRAVENOUS

## 2016-03-04 MED ORDER — SPOT INK MARKER SYRINGE KIT
PACK | SUBMUCOSAL | Status: DC | PRN
  Administered 2016-03-04: 10 mL via SUBMUCOSAL

## 2016-03-04 MED ORDER — GLUCERNA SHAKE PO LIQD
237.0000 mL | Freq: Three times a day (TID) | ORAL | Status: DC
  Administered 2016-03-06: 237 mL via ORAL

## 2016-03-04 MED ORDER — POLYETHYLENE GLYCOL 3350 17 GM/SCOOP PO POWD
1.0000 | Freq: Once | ORAL | Status: DC
  Filled 2016-03-04: qty 255

## 2016-03-04 NOTE — H&P (View-Only) (Signed)
Reason for Consult: Symptomatic anemia Referring Physician: Teaching Service  Brandon Peters HPI: This is a 60 year old male with a PMH of HTN, hyperlipidemia, and DM admitted for symptomatic anemia.  He was identified to have an HGB at 4.0 with an MCV of 96.  His prior HGB level was in 2010 with a reported value of 12.2 g/dL and an MCV of 90.  Since December the patient reports easy fatigability and an unintentional weight loss of 35-40 lbs.  He has not seen a physician in 4 years as he lost his job.  In 2010 he was evaluated in the office for a routine screening colonoscopy, but he cancelled the procedure secondary to a scheduling conflict.  Unfortunately he did not reschedule.  In the ER he was identified to be heme positive, but he denies any evidence of melena or hematochezia.  There is no abdominal imaging and his AP is noted to be markedly elevated at 267 and an albumin at 2.8.  Finally, the patient denies using any NSAIDs outside of an 81 mg ASA.  Past Medical History  Diagnosis Date  . RENAL CALCULUS, HX OF 06/26/2007  . ARF (acute renal failure) (HCC)     d/t lisinopril and/or diazide- was on both in a 2 week period- cr up to 4.99  . Hypertension   . Hyperlipidemia   . Diabetes mellitus     History reviewed. No pertinent past surgical history.  Family History  Problem Relation Age of Onset  . Diabetes Mother   . Hypertension Mother   . Coronary artery disease Mother   . Alcohol abuse Father   . Hypertension Sister     Social History:  reports that he has never smoked. He has never used smokeless tobacco. He reports that he does not drink alcohol or use illicit drugs.  Allergies:  Allergies  Allergen Reactions  . Penicillins Other (See Comments)    intolerance    Medications:  Scheduled: . insulin aspart  0-9 Units Subcutaneous TID WC  . pantoprazole  40 mg Oral Daily  . sodium chloride flush  3 mL Intravenous Q12H   Continuous:   Results for orders placed or  performed during the hospital encounter of 03/03/16 (from the past 24 hour(s))  Type and screen [place]     Status: None (Preliminary result)   Collection Time: 03/03/16  1:14 PM  Result Value Ref Range   ABO/RH(D) O POS    Antibody Screen NEG    Sample Expiration 03/06/2016    Unit Number [id number]    Blood Component Type RED CELLS,LR    Unit division 00    Status of Unit ALLOCATED    Transfusion Status OK TO TRANSFUSE    Crossmatch Result Compatible    Unit Number [id number]    Blood Component Type RED CELLS,LR    Unit division 00    Status of Unit ISSUED    Transfusion Status OK TO TRANSFUSE    Crossmatch Result Compatible    Unit Number [id number]    Blood Component Type RED CELLS,LR    Unit division 00    Status of Unit ALLOCATED    Transfusion Status OK TO TRANSFUSE    Crossmatch Result Compatible   ABO/Rh     Status: None   Collection Time: 03/03/16  1:14 PM  Result Value Ref Range   ABO/RH(D) O POS   Comprehensive metabolic panel     Status: Abnormal   Collection Time: 03/03/16  1:27 PM  Result Value Ref Range   Sodium 136 135 - 145 mmol/L   Potassium 3.9 3.5 - 5.1 mmol/L   Chloride 104 101 - 111 mmol/L   CO2 23 22 - 32 mmol/L   Glucose, Bld 378 (H) 65 - 99 mg/dL   BUN 13 6 - 20 mg/dL   Creatinine, Ser 0.73 0.61 - 1.24 mg/dL   Calcium 9.1 8.9 - 10.3 mg/dL   Total Protein 6.8 6.5 - 8.1 g/dL   Albumin 2.8 (L) 3.5 - 5.0 g/dL   AST 16 15 - 41 U/L   ALT 11 (L) 17 - 63 U/L   Alkaline Phosphatase 267 (H) 38 - 126 U/L   Total Bilirubin 0.5 0.3 - 1.2 mg/dL   GFR calc non Af Amer >60 >60 mL/min   GFR calc Af Amer >60 >60 mL/min   Anion gap 9 5 - 15  CBC     Status: Abnormal   Collection Time: 03/03/16  1:27 PM  Result Value Ref Range   WBC 5.2 4.0 - 10.5 K/uL   RBC 1.36 (L) 4.22 - 5.81 MIL/uL   Hemoglobin 4.0 (LL) 13.0 - 17.0 g/dL   HCT 13.1 (L) 39.0 - 52.0 %   MCV 96.3 78.0 - 100.0 fL   MCH 29.4 26.0 - 34.0 pg   MCHC 30.5 30.0 -  36.0 g/dL   RDW 17.0 (H) 11.5 - 15.5 %   Platelets 277 150 - 400 K/uL  POC occult blood, ED     Status: Abnormal   Collection Time: 03/03/16  2:40 PM  Result Value Ref Range   Fecal Occult Bld POSITIVE (A) NEGATIVE  Prepare RBC     Status: None   Collection Time: 03/03/16  2:40 PM  Result Value Ref Range   Order Confirmation ORDER PROCESSED BY BLOOD BANK   Glucose, capillary     Status: Abnormal   Collection Time: 03/03/16  4:59 PM  Result Value Ref Range   Glucose-Capillary 249 (H) 65 - 99 mg/dL     No results found.  ROS:  As stated above in the HPI otherwise negative.  Blood pressure 169/85, pulse 102, temperature 97.9 F (36.6 C), temperature source Oral, resp. rate 20, height 5\' 5"  (1.651 m), weight 70.67 kg (155 lb 12.8 oz), SpO2 100 %.    PE: Gen: NAD, Alert and Oriented HEENT:  [place]/AT, EOMI Neck: Supple, no LAD Lungs: CTA Bilaterally CV: RRR without M/G/R ABM: Soft, NTND, +BS Ext: No C/C/E  Assessment/Plan: 1) Symptomatic anemia. 2) Heme positive stool. 3) Elevated AP. 4) Unintentional weight loss. 5) DM.   It is interesting that his MCV is not lower with the significant anemia.  This may be suggestive that his bleeding has been more acute, in relative terms.  Further evaluation with an EGD/Colonoscopy is required.  Also, with the weight loss and elevated AP, I am concerned about a malignant process.  After the the endoscopic procedures he will most likely require an imaging scan.  Plan: 1) EGD/Colonoscopy tomorrow. 2) Continue with blood transfusions.   Carlean Crowl D 03/03/2016, 5:25 PM

## 2016-03-04 NOTE — Progress Notes (Addendum)
Patient has received total  3 of units of blood that was ordered by MD.Blood slips on are patient chart.

## 2016-03-04 NOTE — Op Note (Signed)
[facility] Patient Name: Brandon Peters Procedure Date : 03/04/2016 MRN: [id number] Attending MD: Carol Ada , MD Date of Birth: 03/22/1956 CSN: [id number] Age: 60 Admit Type: Inpatient Procedure:                Colonoscopy Indications:              Heme positive stool, Iron deficiency anemia Providers:                Carol Ada, MD, Cleda Daub, RN, Zenon Mayo,                            RN, Elspeth Cho, Technician, Gertie Exon, CRNA Referring MD:              Medicines:                Propofol per Anesthesia Complications:            No immediate complications. Estimated Blood Loss:     Estimated blood loss was minimal. Procedure:                Pre-Anesthesia Assessment:                           - Prior to the procedure, a History and Physical                            was performed, and patient medications and                            allergies were reviewed. The patient's tolerance of                            previous anesthesia was also reviewed. The risks                            and benefits of the procedure and the sedation                            options and risks were discussed with the patient.                            All questions were answered, and informed consent                            was obtained. Prior Anticoagulants: The patient has                            taken no previous anticoagulant or antiplatelet                            agents. ASA Grade Assessment: II - A patient with                            mild systemic disease. After reviewing the risks  and benefits, the patient was deemed in                            satisfactory condition to undergo the procedure.                           - Sedation was administered by an anesthesia                            professional. Deep sedation was attained.                           After obtaining informed consent, the colonoscope              was passed under direct vision. Throughout the                            procedure, the patient's blood pressure, pulse, and                            oxygen saturations were monitored continuously. The                            [id number] scope was introduced through                            the anus and advanced to the the cecum, identified                            by appendiceal orifice and ileocecal valve. The                            colonoscopy was performed without difficulty. The                            patient tolerated the procedure well. The quality                            of the bowel preparation was good. The ileocecal                            valve, appendiceal orifice, and rectum were                            photographed. Scope In: 1:51:07 PM Scope Out: 2:17:17 PM Scope Withdrawal Time: 0 hours 17 minutes 1 second  Total Procedure Duration: 0 hours 26 minutes 10 seconds  Findings:      Two semi-sessile and semi-pedunculated polyps were found in the       descending colon. The polyps were 15 to 30 mm in size. These were       biopsied with a cold forceps for histology. Area was tattooed with an       injection of 5 mL of Spot (carbon black). Estimated blood loss was       minimal.  The larger polyp was around 47 cm from the anal verge and the distal       polyp was at 40 cm. Both of the polyps, when first encountered, were       treated with 1:10,000 Epi in hopes to vasoconstrict and shrink the       polyps. Unfortunately this technique did not work. It was clear with       further inspection of the large polyp base that it encompased 50% of of       the circumfirence. It was too large to resect endoscopically. The       smaller distal polyp was amenable to resection, however, with the close       proximity of the polyp to the larger polyp the small her polyp was not       resected. Impression:               - Two 15 to 30 mm  polyps in the descending colon.                            Biopsied. Tattooed. Moderate Sedation:      None Recommendation:           - Return patient to hospital ward for ongoing care.                           - Resume regular diet.                           - Perform CT scan (computed tomography) of the                            abdomen with contrast today. There is concern for                            metastatic disease and surgery may or may not be                            indicated.                           - Continue present medications.                           - Repeat colonoscopy in 1 year for surveillance if                            it is clinically appropriate. Procedure Code(s):        --- Professional ---                           [phone], Colonoscopy, flexible; with directed                            submucosal injection(s), any substance                           [id number], Colonoscopy, flexible; with biopsy, single  or multiple Diagnosis Code(s):        --- Professional ---                           D12.4, Benign neoplasm of descending colon                           R19.5, Other fecal abnormalities                           D50.9, Iron deficiency anemia, unspecified CPT copyright 2016 American Medical Association. All rights reserved. The codes documented in this report are preliminary and upon coder review may  be revised to meet current compliance requirements. Carol Ada, MD Carol Ada, MD 03/04/2016 2:30:21 PM This report has been signed electronically. Number of Addenda: 0

## 2016-03-04 NOTE — Interval H&P Note (Signed)
History and Physical Interval Note:  03/04/2016 1:19 PM  Brandon Peters  has presented today for surgery, with the diagnosis of Symptomatic anemia and heme positive stool  The various methods of treatment have been discussed with the patient and family. After consideration of risks, benefits and other options for treatment, the patient has consented to  Procedure(s): COLONOSCOPY (N/A) ESOPHAGOGASTRODUODENOSCOPY (EGD) (N/A) as a surgical intervention .  The patient's history has been reviewed, patient examined, no change in status, stable for surgery.  I have reviewed the patient's chart and labs.  Questions were answered to the patient's satisfaction.     Iantha Titsworth D

## 2016-03-04 NOTE — Progress Notes (Signed)
Patient having difficult time drinking golytely prep.Patient has only drank about 3/4 of prep so far.Spoke with Dr. Benson Norway stated it's okay for patient to continue with prep until 0600 if needed but to continue to encourage patient to drink it.

## 2016-03-04 NOTE — Progress Notes (Signed)
Patient has completed prep.Patient stools currently light brown with some small flakes .Will continue to monitor.

## 2016-03-04 NOTE — Anesthesia Preprocedure Evaluation (Signed)
Anesthesia Evaluation  Patient identified by MRN, date of birth, ID band Patient awake    Reviewed: Allergy & Precautions, NPO status , Patient's Chart, lab work & pertinent test results  Airway Mallampati: II  TM Distance: >3 FB Neck ROM: Full    Dental   Pulmonary neg pulmonary ROS,    breath sounds clear to auscultation- rhonchi       Cardiovascular hypertension, Pt. on medications  Rhythm:Regular Rate:Normal     Neuro/Psych negative neurological ROS     GI/Hepatic negative GI ROS, Neg liver ROS,   Endo/Other  diabetes, Type 2, Oral Hypoglycemic Agents  Renal/GU Renal disease     Musculoskeletal   Abdominal   Peds  Hematology  (+) anemia ,   Anesthesia Other Findings   Reproductive/Obstetrics                             Anesthesia Physical Anesthesia Plan  ASA: II  Anesthesia Plan: MAC   Post-op Pain Management:    Induction: Intravenous  Airway Management Planned: Natural Airway and Nasal Cannula  Additional Equipment:   Intra-op Plan:   Post-operative Plan: Extubation in OR  Informed Consent: I have reviewed the patients History and Physical, chart, labs and discussed the procedure including the risks, benefits and alternatives for the proposed anesthesia with the patient or authorized representative who has indicated his/her understanding and acceptance.     Plan Discussed with: CRNA  Anesthesia Plan Comments:         Anesthesia Quick Evaluation

## 2016-03-04 NOTE — Op Note (Signed)
[facility] Patient Name: Brandon Peters Procedure Date : 03/04/2016 MRN: [id number] Attending MD: Carol Ada , MD Date of Birth: 1955-10-17 CSN: [id number] Age: 60 Admit Type: Inpatient Procedure:                Small bowel enteroscopy Indications:              Anemia Providers:                Carol Ada, MD, Cleda Daub, RN, Zenon Mayo,                            RN, Elspeth Cho, Technician, Gertie Exon, CRNA Referring MD:              Medicines:                Propofol per Anesthesia Complications:            No immediate complications. Estimated Blood Loss:     Estimated blood loss was minimal. Estimated blood                            loss was minimal. Procedure:                Pre-Anesthesia Assessment:                           - Prior to the procedure, a History and Physical                            was performed, and patient medications and                            allergies were reviewed. The patient's tolerance of                            previous anesthesia was also reviewed. The risks                            and benefits of the procedure and the sedation                            options and risks were discussed with the patient.                            All questions were answered, and informed consent                            was obtained. Prior Anticoagulants: The patient has                            taken no previous anticoagulant or antiplatelet                            agents. ASA Grade Assessment: II - A patient with  mild systemic disease. After reviewing the risks                            and benefits, the patient was deemed in                            satisfactory condition to undergo the procedure.                           - Sedation was administered by an anesthesia                            professional. Deep sedation was attained.                           After obtaining informed  consent, the endoscope was                            passed under direct vision. Throughout the                            procedure, the patient's blood pressure, pulse, and                            oxygen saturations were monitored continuously. The                            [id number] scope was introduced through                            the mouth, and advanced to the proximal jejunum.                            After obtaining informed consent, the endoscope was                            passed under direct vision. Throughout the                            procedure, the patient's blood pressure, pulse, and                            oxygen saturations were monitored continuously.The                            small bowel enteroscopy was accomplished without                            difficulty. The patient tolerated the procedure                            well. Scope In: Scope Out: Findings:      A large frond-like/villous, fungating and polypoid mass with no bleeding       was found in the proximal jejunum.  Biopsies were taken with a cold       forceps for histology. Estimated blood loss was minimal.      The esophagus was normal.      The stomach was normal.      The examined duodenum was normal.      In the region of the 4th portion of the duodenum and the proximal       jejunum a 25-30% irregular friable small bowel fold was identified. This       is suspicious for a malignancy. Multiple cold biopsies were obtained. Impression:               - Jejunal mass. Biopsied.                           - Normal esophagus.                           - Normal stomach.                           - Normal examined duodenum. Moderate Sedation:      None Recommendation:           - Return patient to hospital ward for ongoing care.                           - Await pathology results.                           - See the colonoscopy report. Procedure Code(s):        ---  Professional ---                           [phone], Small intestinal endoscopy, enteroscopy                            beyond second portion of duodenum, not including                            ileum; with biopsy, single or multiple Diagnosis Code(s):        --- Professional ---                           K63.89, Other specified diseases of intestine                           D64.9, Anemia, unspecified CPT copyright 2016 [employer] Association. All rights reserved. The codes documented in this report are preliminary and upon coder review may  be revised to meet current compliance requirements. Carol Ada, MD Carol Ada, MD 03/04/2016 2:38:20 PM This report has been signed electronically. Number of Addenda: 0

## 2016-03-04 NOTE — Transfer of Care (Signed)
Immediate Anesthesia Transfer of Care Note  Patient: Brandon Peters  Procedure(s) Performed: Procedure(s): COLONOSCOPY (N/A) ESOPHAGOGASTRODUODENOSCOPY (EGD) (N/A)  Patient Location: PACU and Endoscopy Unit  Anesthesia Type:MAC  Level of Consciousness: awake, alert , oriented and sedated  Airway & Oxygen Therapy: Patient Spontanous Breathing and Patient connected to nasal cannula oxygen  Post-op Assessment: Report given to RN, Post -op Vital signs reviewed and stable and Patient moving all extremities  Post vital signs: Reviewed and stable  Last Vitals:  Filed Vitals:   03/04/16 1252 03/04/16 1433  BP: 180/97 130/65  Pulse:  82  Temp:    Resp:  28    Last Pain:  Filed Vitals:   03/04/16 1434  PainSc: 5       Patients Stated Pain Goal: 0 ([id number])  Complications: No apparent anesthesia complications

## 2016-03-04 NOTE — Progress Notes (Signed)
Inpatient Diabetes Program Recommendations  AACE/ADA: New Consensus Statement on Inpatient Glycemic Control (2015)  Target Ranges:  Prepandial:   less than 140 mg/dL      Peak postprandial:   less than 180 mg/dL (1-2 hours)      Critically ill patients:  140 - 180 mg/dL   Results for CY, BARRISH (MRN [id number]) as of 03/04/2016 11:42  Ref. Range 03/03/2016 16:59 03/03/2016 20:08 03/04/2016 07:28  Glucose-Capillary Latest Ref Range: 65-99 mg/dL 249 (H) 207 (H) 206 (H)   Review of Glycemic Control  Diabetes history: DM 2 Outpatient Diabetes medications: Metformin 500 mg BID Current orders for Inpatient glycemic control: Novolog Sensitive TID  Inpatient Diabetes Program Recommendations: Correction (SSI): Glucose in the low 200 range. May want to consider increasing correction to Novolog Moderate TID.  Thanks,  Tama Headings RN, MSN, [facility] Inpatient Diabetes Coordinator Team Pager [phone] (8a-5p)

## 2016-03-04 NOTE — Anesthesia Postprocedure Evaluation (Signed)
Anesthesia Post Note  Patient: Brandon Peters  Procedure(s) Performed: Procedure(s) (LRB): COLONOSCOPY (N/A) ESOPHAGOGASTRODUODENOSCOPY (EGD) (N/A)  Patient location during evaluation: PACU Anesthesia Type: MAC Level of consciousness: awake and alert Pain management: pain level controlled Vital Signs Assessment: post-procedure vital signs reviewed and stable Respiratory status: spontaneous breathing, nonlabored ventilation, respiratory function stable and patient connected to nasal cannula oxygen Cardiovascular status: stable and blood pressure returned to baseline Anesthetic complications: no    Last Vitals:  Filed Vitals:   03/04/16 1252 03/04/16 1433  BP: 180/97 130/65  Pulse:  82  Temp:    Resp:  28    Last Pain:  Filed Vitals:   03/04/16 1434  PainSc: 5                  Tiajuana Amass

## 2016-03-04 NOTE — Progress Notes (Signed)
Subjective: He received 3 units of blood overnight and is feeling much more energetic today. Completed bowel prep in anticipation of EGD/colonscopy this afternoon. Has not noticed any bleeding.  Objective: Vital signs in last 24 hours: Filed Vitals:   03/04/16 0053 03/04/16 0424 03/04/16 0611 03/04/16 0729  BP: 158/94 162/107 174/99 148/82  Pulse: 88 101 96 75  Temp: 97.9 F (36.6 C) 97.5 F (36.4 C) 97.8 F (36.6 C) 98.2 F (36.8 C)  TempSrc: Oral   Oral  Resp: 20 20 20 18   Height:      Weight:      SpO2: 98% 98% 98% 100%   Weight change:   Intake/Output Summary (Last 24 hours) at 03/04/16 1150 Last data filed at 03/04/16 1004  Gross per 24 hour  Intake   1459 ml  Output      0 ml  Net   1459 ml   GENERAL- alert, co-operative, NAD HEENT- Temporal wasting present, Oral and conjunctival pallor improved still mildly present CARDIAC- RRR, 2/6 early systolic murmur RESP- CTAB, no wheezes or crackles ABDOMEN- Soft, nontender, no guarding or rebound, normoactive bowel sounds present EXTREMITIES- R>L pitting pedal edema less than halfway to shin, stasis dermatitis skin changes over feet and shins PSYCH- Normal mood and affect, appropriate thought content and speech  Lab Results: Basic Metabolic Panel:  Recent Labs Lab 03/03/16 1327 03/04/16 0648  NA 136 136  K 3.9 3.3*  CL 104 104  CO2 23 24  GLUCOSE 378* 201*  BUN 13 9  CREATININE 0.73 0.62  CALCIUM 9.1 9.1   Liver Function Tests:  Recent Labs Lab 03/03/16 1327 03/04/16 0648  AST 16 19  ALT 11* 10*  ALKPHOS 267* 279*  BILITOT 0.5 0.7  PROT 6.8 6.6  ALBUMIN 2.8* 2.8*   CBC:  Recent Labs Lab 03/03/16 1327 03/04/16 0648  WBC 5.2 5.8  HGB 4.0* 8.3*  HCT 13.1* 25.0*  MCV 96.3 87.8  PLT 277 225   CBG:  Recent Labs Lab 03/03/16 1659 03/03/16 2008 03/04/16 0728  GLUCAP 249* 207* 206*   Anemia Panel:  Recent Labs Lab 03/03/16 1654  FERRITIN 724*  RETICCTPCT 2.1    Urinalysis:  Recent Labs Lab 03/04/16 0020  COLORURINE YELLOW  LABSPEC 1.017  PHURINE 5.5  GLUCOSEU 500*  HGBUR NEGATIVE  BILIRUBINUR NEGATIVE  KETONESUR NEGATIVE  PROTEINUR NEGATIVE  NITRITE NEGATIVE  LEUKOCYTESUR NEGATIVE  No results found.  Medications: I have reviewed the patient's current medications. Scheduled Meds: . Chlorhexidine Gluconate Cloth  6 each Topical Daily  . insulin aspart  0-9 Units Subcutaneous TID WC  . mupirocin ointment  1 application Nasal BID  . pantoprazole  40 mg Oral Daily  . polyethylene glycol powder  1 Container Oral Once  . potassium chloride  40 mEq Oral Once  . sodium chloride flush  3 mL Intravenous Q12H   Continuous Infusions: . sodium chloride 20 mL/hr at 03/04/16 0056   PRN Meds:.acetaminophen Assessment/Plan: Symptomatic anemia: Anemia improved 4.0 to 8.6 after 3 units pRBCs overnight. Reticulocyte count is inappropriately normal in the setting of severe anemia. Ferritin and MCV not decreased which would suggest a more acute anemia although history suggest a more chronic anemia. BUN not elevated so gastric/upper GI source somewhat less likely. AVM also possible as a bleeding cause. If found alongside persistent exertional dyspnea after transfusion would consider Heyde syndrome and need echocardiography. -Orthostatic vital signs, ambulate patient for exercise tolerance -follow repeat CBC -Pantoprazole 40mg  -ggt ordered in  setting of high alkphos -Endoscopy pending, F/U findings, may require additional abdominal imaging -GI consulted for further evaluation, recs appreciated  Unintentional weight loss: 30-40 lb weight loss over what is reportedly the past 6 months. Very concerning for malignancy in the setting of his anemia. Hypoalbuminemia may also just be indicative of malnutrition. GI consulted for workup as above.  Hypertension: Still moderately hypertensive. No proteinuria on UA. Asymptomatic at rest and has not been challenged  to walk around yet. -Will consider restart coreg after procedure today if vitals unchanged and ambulates well  Type 2 diabetes mellitus: CBGs slowly normalizing on sensitive insulin treatment. Glucosuria but no proteinuria on UA so hypoalbuminemia and pedal edema not related to this. -Carb modified diet -SSI-S -CBG monitoring -HgbA1c pending  Hypokalemia: Mild 3.3 today most likely iatrogenic due to bowel prep. Will plan to replace with 2mEq x1 later today. If persistent tomorrow may start more continuous therapy. -K-Dur 60mEq once  FULL CODE Diet: Clears pending endoscopy VTE ppx: SCDs only pending anemia work up  Dispo: Disposition is deferred at this time, awaiting improvement of current medical problems. Anticipated discharge in approximately 1-3 day(s).   The patient does not know have a current PCP (No primary care provider on file.) and does need an [facility] hospital follow-up appointment after discharge. The patient does not know have transportation limitations that hinder transportation to clinic appointments.   LOS: 1 day   Collier Salina, MD 03/04/2016, 11:50 AM

## 2016-03-04 NOTE — Progress Notes (Signed)
Initial Nutrition Assessment  DOCUMENTATION CODES:   Severe malnutrition in context of chronic illness  INTERVENTION:  Once diet advances, provide Glucerna Shake po TID, each supplement provides 220 kcal and 10 grams of protein.  NUTRITION DIAGNOSIS:   Malnutrition related to chronic illness as evidenced by energy intake < or equal to 75% for > or equal to 1 month, percent weight loss.  GOAL:   Patient will meet greater than or equal to 90% of their needs  MONITOR:   Diet advancement, Supplement acceptance, Weight trends, Labs, I & O's  REASON FOR ASSESSMENT:   Malnutrition Screening Tool    ASSESSMENT:   60 y/o man with PHMx significant for hypertension, type 2 diabetes, and hyperlipidemia presents for a Hgb of 4.0 seen at his doctor's office yesterday with worsening fatigue and dyspnea on exertion.  Pt is currently NPO for procedures today, EGD and colonoscopy. Pt reports having a decreased appetite which has been ongoing since December 2016. Pt reports he has been eating at least 3 meals a day however meal completion has been 50%. Pt endorses weight loss. Usual body weight reported to be ~190-200 lbs last weighing back in December 2016. Pt with a 18% weight loss in 6 months. Pt is agreeable to nutritional supplements once diet advanced. RD to order. Nursing staff to provide once diet advances.   Pt with no observed significant fat or muscle mass loss.   Labs and medications reviewed.   Diet Order:  Diet NPO time specified  Skin:  Reviewed, no issues  Last BM:  6/9  Height:   Ht Readings from Last 1 Encounters:  03/03/16 5\' 5"  (1.651 m)    Weight:   Wt Readings from Last 1 Encounters:  03/03/16 156 lb 8 oz (70.988 kg)    Ideal Body Weight:  61.8 kg  BMI:  Body mass index is 26.04 kg/(m^2).  Estimated Nutritional Needs:   Kcal:  1900-2100  Protein:  85-100 grams  Fluid:  1.9 - 2.1 L/day  EDUCATION NEEDS:   No education needs identified at this  time  Corrin Parker, MS, RD, LDN Pager # [phone] After hours/ weekend pager # [phone]

## 2016-03-04 NOTE — Progress Notes (Signed)
Pharmacist Student Provided - Patient Medication Education Education Environmental health practitioner Service Patient  Education: Educated the patient on some of current medications, specifically protonix. Reviewed side effects with the patient. Answered all of the patient's medication specific questions. These medications are on the patient's current med list and are subject to change upon discharge.  Patient was also concerned that carvedilol, on discharge, would only be available as a brand name medication. States this as part of the cause of him not taking the medication previously. On review, he was previously prescribed Coreg CR, which is only available as branded medication. The immediate release formulation, however, is available as generic. If carvedilol is to be continued at discharge, the patient and family want this option considered.  Lorelei Pont P4 PharmD Candidate

## 2016-03-05 ENCOUNTER — Inpatient Hospital Stay (HOSPITAL_COMMUNITY): Payer: MEDICAID

## 2016-03-05 ENCOUNTER — Encounter (HOSPITAL_COMMUNITY): Payer: Self-pay | Admitting: Gastroenterology

## 2016-03-05 DIAGNOSIS — D124 Benign neoplasm of descending colon: Secondary | ICD-10-CM

## 2016-03-05 DIAGNOSIS — N2889 Other specified disorders of kidney and ureter: Secondary | ICD-10-CM

## 2016-03-05 DIAGNOSIS — R590 Localized enlarged lymph nodes: Secondary | ICD-10-CM

## 2016-03-05 DIAGNOSIS — E278 Other specified disorders of adrenal gland: Secondary | ICD-10-CM

## 2016-03-05 DIAGNOSIS — M8979 Major osseous defect, multiple sites: Secondary | ICD-10-CM

## 2016-03-05 DIAGNOSIS — K6389 Other specified diseases of intestine: Secondary | ICD-10-CM

## 2016-03-05 DIAGNOSIS — N400 Enlarged prostate without lower urinary tract symptoms: Secondary | ICD-10-CM

## 2016-03-05 LAB — CBC
HCT: 27.5 % — ABNORMAL LOW (ref 39.0–52.0)
Hemoglobin: 8.9 g/dL — ABNORMAL LOW (ref 13.0–17.0)
MCH: 28.5 pg (ref 26.0–34.0)
MCHC: 32.4 g/dL (ref 30.0–36.0)
MCV: 88.1 fL (ref 78.0–100.0)
PLATELETS: 241 10*3/uL (ref 150–400)
RBC: 3.12 MIL/uL — AB (ref 4.22–5.81)
RDW: 18.3 % — AB (ref 11.5–15.5)
WBC: 5.7 10*3/uL (ref 4.0–10.5)

## 2016-03-05 LAB — COMPREHENSIVE METABOLIC PANEL
ALBUMIN: 2.8 g/dL — AB (ref 3.5–5.0)
ALT: 12 U/L — AB (ref 17–63)
AST: 17 U/L (ref 15–41)
Alkaline Phosphatase: 290 U/L — ABNORMAL HIGH (ref 38–126)
Anion gap: 14 (ref 5–15)
CHLORIDE: 100 mmol/L — AB (ref 101–111)
CO2: 24 mmol/L (ref 22–32)
CREATININE: 0.66 mg/dL (ref 0.61–1.24)
Calcium: 9.4 mg/dL (ref 8.9–10.3)
GFR calc Af Amer: >60 mL/min (ref >60)
GFR calc non Af Amer: >60 mL/min (ref >60)
GLUCOSE: 147 mg/dL — AB (ref 65–99)
POTASSIUM: 3.6 mmol/L (ref 3.5–5.1)
SODIUM: 138 mmol/L (ref 135–145)
Total Bilirubin: 0.7 mg/dL (ref 0.3–1.2)
Total Protein: 7 g/dL (ref 6.5–8.1)

## 2016-03-05 LAB — GLUCOSE, CAPILLARY
GLUCOSE-CAPILLARY: 118 mg/dL — AB (ref 65–99)
Glucose-Capillary: 138 mg/dL — ABNORMAL HIGH (ref 65–99)
Glucose-Capillary: 143 mg/dL — ABNORMAL HIGH (ref 65–99)
Glucose-Capillary: 130 mg/dL — ABNORMAL HIGH (ref 65–99)

## 2016-03-05 LAB — HEMOGLOBIN A1C
Hgb A1c MFr Bld: 7.6 % — ABNORMAL HIGH (ref 4.8–5.6)
Mean Plasma Glucose: 171 mg/dL

## 2016-03-05 LAB — PSA: PSA: 1531 ng/mL — AB (ref 0.00–4.00)

## 2016-03-05 MED ORDER — GADOBENATE DIMEGLUMINE 529 MG/ML IV SOLN
15.0000 mL | Freq: Once | INTRAVENOUS | Status: AC | PRN
  Administered 2016-03-05: 15 mL via INTRAVENOUS

## 2016-03-05 MED ORDER — AMLODIPINE BESYLATE 5 MG PO TABS
5.0000 mg | ORAL_TABLET | Freq: Every day | ORAL | Status: DC
  Administered 2016-03-05 – 2016-03-06 (×2): 5 mg via ORAL
  Filled 2016-03-05 (×2): qty 1

## 2016-03-05 NOTE — Progress Notes (Signed)
Subjective: Pt has no complaints this morning. Discussed colonoscopy and CT abd findings w/ patient.   Objective: Vital signs in last 24 hours: Filed Vitals:   03/04/16 1648 03/04/16 2013 03/05/16 0503 03/05/16 0741  BP: 151/79 138/80 157/83 170/92  Pulse: 77 90 83 76  Temp: 97.7 F (36.5 C) 98.4 F (36.9 C) 97.9 F (36.6 C) 97.7 F (36.5 C)  TempSrc: Oral Oral Oral Oral  Resp: 16 18 19 18   Height:      Weight:      SpO2: 98% 98% 100% 98%   Weight change:   Intake/Output Summary (Last 24 hours) at 03/05/16 0754 Last data filed at 03/05/16 0741  Gross per 24 hour  Intake      0 ml  Output     50 ml  Net    -50 ml   GENERAL- alert, co-operative, NAD HEENT- Temporal wasting present, moist mucous membranes CARDIAC- RRR, 2/6 early systolic murmur RESP- CTAB, no wheezes or crackles ABDOMEN- Soft, nontender, no guarding or rebound, normoactive bowel sounds present PSYCH- Normal mood and affect, appropriate thought content and speech  Lab Results: Basic Metabolic Panel:  Recent Labs Lab 03/04/16 0648 03/05/16 0454  NA 136 138  K 3.3* 3.6  CL 104 100*  CO2 24 24  GLUCOSE 201* 147*  BUN 9 <5*  CREATININE 0.62 0.66  CALCIUM 9.1 9.4   Liver Function Tests:  Recent Labs Lab 03/04/16 0648 03/05/16 0454  AST 19 17  ALT 10* 12*  ALKPHOS 279* 290*  BILITOT 0.7 0.7  PROT 6.6 7.0  ALBUMIN 2.8* 2.8*   CBC:  Recent Labs Lab 03/04/16 0648 03/05/16 0454  WBC 5.8 5.7  HGB 8.3* 8.9*  HCT 25.0* 27.5*  MCV 87.8 88.1  PLT 225 241   CBG:  Recent Labs Lab 03/03/16 1659 03/03/16 2008 03/04/16 0728 03/04/16 1140 03/04/16 1646 03/04/16 2010  GLUCAP 249* 207* 206* 132* 131* 154*   Anemia Panel:  Recent Labs Lab 03/03/16 1654  FERRITIN 724*  RETICCTPCT 2.1   Urinalysis:  Recent Labs Lab 03/04/16 0020  COLORURINE YELLOW  LABSPEC 1.017  PHURINE 5.5  GLUCOSEU 500*  HGBUR NEGATIVE  BILIRUBINUR NEGATIVE  KETONESUR NEGATIVE  PROTEINUR NEGATIVE    NITRITE NEGATIVE  LEUKOCYTESUR NEGATIVE  Ct Abdomen Pelvis W Contrast  03/04/2016  CLINICAL DATA:  60 year old male inpatient with a reported history of symptomatic anemia and jejunal polyp and descending colon polyps at endoscopy earlier today. EXAM: CT ABDOMEN AND PELVIS WITH CONTRAST TECHNIQUE: Multidetector CT imaging of the abdomen and pelvis was performed using the standard protocol following bolus administration of intravenous contrast. CONTRAST:  100 cc ISOVUE-300 IOPAMIDOL (ISOVUE-300) INJECTION 61% COMPARISON:  06/19/2007 renal sonogram.  No prior CT abdomen/pelvis. FINDINGS: Lower chest: No significant pulmonary nodules or acute consolidative airspace disease. Mosaic attenuation at the lung bases. Mild cardiomegaly. Hepatobiliary: Normal liver with no liver mass. Normal gallbladder with no radiopaque cholelithiasis. No biliary ductal dilatation. Pancreas: Normal, with no mass or duct dilation. Spleen: Normal size. No mass. 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 Normal left adrenal. No hydronephrosis. Posterior right lower renal 2.2 x 2.0 cm mass with heterogeneous hyperenhancement (series 201/ image 36) with de-enhancement on the delayed sequence. No additional renal masses. Normal bladder. Stomach/Bowel: Grossly normal stomach. Normal caliber small bowel with no small bowel wall thickening. Normal appendix. Colonic mass at the junction of the splenic flexure of the colon and descending colon measuring 2.7 x 2.0 cm (series  301/ image 7), suspicious for primary colonic neoplasm. There a few tiny extraluminal foci of gas adjacent to the splenic flexure of the colon (series 201/ image 15). Otherwise no large bowel wall thickening. No pericolonic fat stranding. Vascular/Lymphatic: Normal caliber abdominal aorta. Patent portal, splenic, hepatic and renal veins. Mildly enlarged 1.2 cm aortocaval node (series 201/ image 33). Multiple mildly enlarged left para-aortic lymph  nodes measuring up to 1.2 cm (series 201/ image 47). Bilateral common iliac lymphadenopathy measuring up to 1.3 cm on the right (series 201/ image 55) and 1.1 cm on the left (series 201/ image 53). Mildly enlarged 1.5 cm left external iliac node (series 201/ image 66). Reproductive: Mildly enlarged prostate measuring 4.2 x 3.5 x 4.2 cm (volume = 32.1 cc). Other: No ascites or focal fluid collection. There a few tiny foci of retroperitoneal gas posterior to the distal duodenum (series 201/image 27). Musculoskeletal: There are extensive confluent sclerotic osseous lesions throughout the entire visualized skeleton involving the entire thoracolumbar spine, bilateral lower ribs and bilateral pelvic girdle. Moderate degenerative changes in the visualized thoracolumbar spine. IMPRESSION: 1. Colonic mass at the junction of the splenic flexure of the colon and descending colon measuring 2.7 x 2.0 cm, suspicious for a primary colonic neoplasm. Recommend correlation with colonoscopy performed earlier today. Tiny foci of extraluminal gas adjacent to the distal duodenum and splenic flexure of the colon are presumably related to endoscopic biopsies performed earlier today. No extraluminal fluid collections. 2. Heterogeneously hyperenhancing 2.2 cm renal mass in the posterior lower right kidney, suspicious for renal cell carcinoma. Recommend further evaluation with renal protocol MRI abdomen without and with IV contrast. 3. Retroperitoneal and bilateral pelvic lymphadenopathy, suspicious for nodal metastases. 4. Extensive confluent sclerotic osseous lesions throughout the entire visualized skeleton, consistent with widespread sclerotic osseous metastases. Recommend correlation with PSA. 5. Mild prostatomegaly. 6. Mild cardiomegaly. 7. Right adrenal myelolipoma. 8. Mosaic attenuation at the lung bases, a nonspecific finding most commonly due to air trapping from small airways disease. Electronically Signed   By: Ilona Sorrel M.D.    On: 03/04/2016 19:26    Medications: I have reviewed the patient's current medications. Scheduled Meds: . Chlorhexidine Gluconate Cloth  6 each Topical Daily  . feeding supplement (GLUCERNA SHAKE)  237 mL Oral TID BM  . insulin aspart  0-9 Units Subcutaneous TID WC  . mupirocin ointment  1 application Nasal BID  . pantoprazole  40 mg Oral Daily  . sodium chloride flush  3 mL Intravenous Q12H   Continuous Infusions:   PRN Meds:.acetaminophen Assessment/Plan:   Unintentional weight loss: 30-40 lb weight loss over what is reportedly the past 6 month. Colonoscopy revealed two 15 to 30 mm polyps in the descending colon that were biopsied. A jejunal mass was biopsied during small bowel endoscopy. CT abd w/ 2.7x2.0j cm colonic mass, 2.2cm renal mass in posterior lower right kidney, pelvic lymphadenopathy, extensive confluent sclerotic osseous lesions throughout the entire visualized skeleton, prostatomegaly, rt adrenal myelolipoma. Discussed findings w/ patient and the need for further workup. Informed patient of support resources available in the hospital.  - checking PSA - MRI abd w and wo contrast for possible renal cell carcinoma - GI following, appreciate their recommendations  Symptomatic anemia: Received 3 units pRBC on admission for hgb of 4. improved, hgb 8.9 today. -Pantoprazole 40mg   Hypertension:  -started on norvasc 5mg  today for elevated bp, previously on norvasc 10mg   Type 2 diabetes mellitus: hgbA1c this admission 7.6 -Carb modified diet -SSI-S -CBG monitoring  Hypokalemia: resolved, K 3.6 today  FULL CODE Diet: Clears pending endoscopy VTE ppx: SCDs in setting of acute bleed  Dispo: Disposition is deferred at this time, awaiting improvement of current medical problems. Anticipated discharge in approximately 1-3 day(s).   The patient does not know have a current PCP (No primary care provider on file.) and does need an [facility] hospital follow-up appointment after  discharge. The patient does not know have transportation limitations that hinder transportation to clinic appointments.   LOS: 2 days   Norman Herrlich, MD 03/05/2016, 7:54 AM

## 2016-03-05 NOTE — Progress Notes (Addendum)
CROSS COVER FOR DR. PATRICK HUNG Subjective: Patient examined and chart reviewed. 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 He was also noted to have a mass in the distal duodenum/jejunum, frond-like in appearance-biopsies are pending. CT scan revealed several osseous metastases ?prostate cancer. Also noted on the CT scan was a 2.2 cm mass in the right kidney. MRI of the kidneys is pending. Patient gives a history of increased urinary hesitancy in the last few days he denies having any abdominal pain, nausea or vomiting. He's had some problems with constipation off and on he denies any melena or hematochezia his appetite is fairly good his weights been stable.  Objective: Vital signs in last 24 hours: Temp:  [97.7 F (36.5 C)-98.4 F (36.9 C)] 97.7 F (36.5 C) (06/10 0741) Pulse Rate:  [76-97] 76 (06/10 0741) Resp:  [16-28] 18 (06/10 0741) BP: (130-189)/(65-111) 170/92 mmHg (06/10 0741) SpO2:  [94 %-100 %] 98 % (06/10 0741) Last BM Date: 03/04/16  Intake/Output from previous day: 06/09 0701 - 06/10 0700 In: 0  Out: 50 [Urine:50] Intake/Output this shift:   General appearance: alert, cooperative, appears older than stated age, fatigued, no distress, moderately obese and pale Resp: clear to auscultation bilaterally Cardio: regular rate and rhythm, S1, S2 normal, no murmur, click, rub or gallop GI: soft, obese, non-tender; bowel sounds normal; no masses,  no organomegaly  Lab Results:  Recent Labs  03/03/16 1327 03/04/16 0648 03/05/16 0454  WBC 5.2 5.8 5.7  HGB 4.0* 8.3* 8.9*  HCT 13.1* 25.0* 27.5*  PLT 277 225 241   BMET  Recent Labs  03/03/16 1327 03/04/16 0648 03/05/16 0454  NA 136 136 138  K 3.9 3.3* 3.6  CL 104 104 100*  CO2 23 24 24   GLUCOSE 378* 201* 147*  BUN 13 9 <5*  CREATININE 0.73 0.62 0.66  CALCIUM 9.1 9.1 9.4   LFT  Recent Labs  03/05/16 0454  PROT 7.0  ALBUMIN 2.8*  AST 17  ALT 12*  ALKPHOS 290*  BILITOT 0.7    Studies/Results: Ct Abdomen Pelvis W Contrast  03/04/2016  CLINICAL DATA:  60 year old male inpatient with a reported history of symptomatic anemia and jejunal polyp and descending colon polyps at endoscopy earlier today. EXAM: CT ABDOMEN AND PELVIS WITH CONTRAST TECHNIQUE: Multidetector CT imaging of the abdomen and pelvis was performed using the standard protocol following bolus administration of intravenous contrast. CONTRAST:  100 cc ISOVUE-300 IOPAMIDOL (ISOVUE-300) INJECTION 61% COMPARISON:  06/19/2007 renal sonogram.  No prior CT abdomen/pelvis. FINDINGS: Lower chest: No significant pulmonary nodules or acute consolidative airspace disease. Mosaic attenuation at the lung bases. Mild cardiomegaly. Hepatobiliary: Normal liver with no liver mass. Normal gallbladder with no radiopaque cholelithiasis. No biliary ductal dilatation. Pancreas: Normal, with no mass or duct dilation. Spleen: Normal size. No mass. 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 Normal left adrenal. No hydronephrosis. Posterior right lower renal 2.2 x 2.0 cm mass with heterogeneous hyperenhancement (series 201/ image 36) with de-enhancement on the delayed sequence. No additional renal masses. Normal bladder. Stomach/Bowel: Grossly normal stomach. Normal caliber small bowel with no small bowel wall thickening. Normal appendix. Colonic mass at the junction of the splenic flexure of the colon and descending colon measuring 2.7 x 2.0 cm (series 301/ image 7), suspicious for primary colonic neoplasm. There a few tiny extraluminal foci of gas adjacent to the splenic flexure of the colon (series 201/ image 15). Otherwise no large bowel wall thickening. No pericolonic fat stranding.  Vascular/Lymphatic: Normal caliber abdominal aorta. Patent portal, splenic, hepatic and renal veins. Mildly enlarged 1.2 cm aortocaval node (series 201/ image 33). Multiple mildly enlarged left para-aortic lymph nodes measuring up to  1.2 cm (series 201/ image 47). Bilateral common iliac lymphadenopathy measuring up to 1.3 cm on the right (series 201/ image 55) and 1.1 cm on the left (series 201/ image 53). Mildly enlarged 1.5 cm left external iliac node (series 201/ image 66). Reproductive: Mildly enlarged prostate measuring 4.2 x 3.5 x 4.2 cm (volume = 32.1 cc). Other: No ascites or focal fluid collection. There a few tiny foci of retroperitoneal gas posterior to the distal duodenum (series 201/image 27). Musculoskeletal: There are extensive confluent sclerotic osseous lesions throughout the entire visualized skeleton involving the entire thoracolumbar spine, bilateral lower ribs and bilateral pelvic girdle. Moderate degenerative changes in the visualized thoracolumbar spine. IMPRESSION: 1. Colonic mass at the junction of the splenic flexure of the colon and descending colon measuring 2.7 x 2.0 cm, suspicious for a primary colonic neoplasm. Recommend correlation with colonoscopy performed earlier today. Tiny foci of extraluminal gas adjacent to the distal duodenum and splenic flexure of the colon are presumably related to endoscopic biopsies performed earlier today. No extraluminal fluid collections. 2. Heterogeneously hyperenhancing 2.2 cm renal mass in the posterior lower right kidney, suspicious for renal cell carcinoma. Recommend further evaluation with renal protocol MRI abdomen without and with IV contrast. 3. Retroperitoneal and bilateral pelvic lymphadenopathy, suspicious for nodal metastases. 4. Extensive confluent sclerotic osseous lesions throughout the entire visualized skeleton, consistent with widespread sclerotic osseous metastases. Recommend correlation with PSA. 5. Mild prostatomegaly. 6. Mild cardiomegaly. 7. Right adrenal myelolipoma. 8. Mosaic attenuation at the lung bases, a nonspecific finding most commonly due to air trapping from small airways disease. Electronically Signed   By: Ilona Sorrel M.D.   On: 03/04/2016 19:26    Medications: I have reviewed the patient's current medications.  Assessment/Plan: 1) Severe anemia/colonic mass along with a jejunal mass on enteroscopy/renal mass-MRI pending.  2) Mass in the right kidney ?RCC. MRI pending.  3) ?Prostate cancer :CT also shows multiple sclerotic ossoeus mets along with pelvic and retroperitoneal lymphadenopathy; rule out prostate cancer.  4) Severe PCM.  5) Uncontrolled AODM. 6) HTN/Hyperlipidemia/Obesity.  LOS: 2 days   Krystn Dermody 03/05/2016, 9:01 AM

## 2016-03-06 DIAGNOSIS — C61 Malignant neoplasm of prostate: Secondary | ICD-10-CM

## 2016-03-06 LAB — CBC
HEMATOCRIT: 24.6 % — AB (ref 39.0–52.0)
Hemoglobin: 7.9 g/dL — ABNORMAL LOW (ref 13.0–17.0)
MCH: 29 pg (ref 26.0–34.0)
MCHC: 32.1 g/dL (ref 30.0–36.0)
MCV: 90.4 fL (ref 78.0–100.0)
PLATELETS: 195 10*3/uL (ref 150–400)
RBC: 2.72 MIL/uL — ABNORMAL LOW (ref 4.22–5.81)
RDW: 17.9 % — AB (ref 11.5–15.5)
WBC: 4.8 10*3/uL (ref 4.0–10.5)

## 2016-03-06 LAB — GLUCOSE, CAPILLARY
GLUCOSE-CAPILLARY: 119 mg/dL — AB (ref 65–99)
GLUCOSE-CAPILLARY: 191 mg/dL — AB (ref 65–99)

## 2016-03-06 MED ORDER — DOCUSATE SODIUM 100 MG PO CAPS
100.0000 mg | ORAL_CAPSULE | Freq: Every day | ORAL | Status: DC
  Administered 2016-03-06: 100 mg via ORAL
  Filled 2016-03-06: qty 1

## 2016-03-06 MED ORDER — DOCUSATE SODIUM 100 MG PO CAPS: 100.0000 mg | ORAL_CAPSULE | Freq: Every day | ORAL | Status: DC

## 2016-03-06 MED ORDER — FERROUS SULFATE 325 (65 FE) MG PO TABS: 325.0000 mg | ORAL_TABLET | Freq: Every day | ORAL | Status: DC

## 2016-03-06 MED ORDER — AMLODIPINE BESYLATE 5 MG PO TABS: 5.0000 mg | ORAL_TABLET | Freq: Every day | ORAL | Status: DC

## 2016-03-06 MED ORDER — METFORMIN HCL 500 MG PO TABS: 500.0000 mg | ORAL_TABLET | Freq: Two times a day (BID) | ORAL | Status: DC

## 2016-03-06 MED ORDER — FERROUS SULFATE 325 (65 FE) MG PO TABS
325.0000 mg | ORAL_TABLET | Freq: Every day | ORAL | Status: DC
  Administered 2016-03-06: 325 mg via ORAL
  Filled 2016-03-06: qty 1

## 2016-03-06 NOTE — Progress Notes (Signed)
Subjective: Since I last evaluated the patient, he seems to be doing fairly well. He denies having any abdominal pain, nausea or vomiting. He had a BM this morning. Awaiting discharge.  Objective: Vital signs in last 24 hours: Temp:  [97.5 F (36.4 C)-97.9 F (36.6 C)] 97.7 F (36.5 C) (06/11 0745) Pulse Rate:  [75-99] 75 (06/11 0745) Resp:  [16-18] 16 (06/11 0745) BP: (112-150)/(60-81) 128/74 mmHg (06/11 0745) SpO2:  [98 %-100 %] 100 % (06/11 0745) Last BM Date: 03/04/16  Intake/Output from previous day: 06/10 0701 - 06/11 0700 In: 600 [P.O.:600] Out: 1 [Urine:1] Intake/Output this shift:    General appearance: alert, cooperative, appears stated age, no distress and mildly obese Resp: clear to auscultation bilaterally Cardio: regular rate and rhythm, S1, S2 normal, no murmur, click, rub or gallop GI: soft, obese, non-tender; bowel sounds normal; no masses,  no organomegaly  Lab Results:  Recent Labs  03/04/16 0648 03/05/16 0454 03/06/16 0438  WBC 5.8 5.7 4.8  HGB 8.3* 8.9* 7.9*  HCT 25.0* 27.5* 24.6*  PLT 225 241 195   BMET  Recent Labs  03/03/16 1327 03/04/16 0648 03/05/16 0454  NA 136 136 138  K 3.9 3.3* 3.6  CL 104 104 100*  CO2 23 24 24   GLUCOSE 378* 201* 147*  BUN 13 9 <5*  CREATININE 0.73 0.62 0.66  CALCIUM 9.1 9.1 9.4   LFT  Recent Labs  03/05/16 0454  PROT 7.0  ALBUMIN 2.8*  AST 17  ALT 12*  ALKPHOS 290*  BILITOT 0.7   Studies/Results: Mr Abdomen W Wo Contrast  03/05/2016  CLINICAL DATA:  Right renal lesion on CT.  Colon mass. EXAM: MRI ABDOMEN WITHOUT AND WITH CONTRAST TECHNIQUE: Multiplanar multisequence MR imaging of the abdomen was performed both before and after the administration of intravenous contrast. CONTRAST:  38mL MULTIHANCE GADOBENATE DIMEGLUMINE 529 MG/ML IV SOLN COMPARISON:  CT of 1 day prior FINDINGS: Moderate degradation, secondary to a combination of motion and scanner/technique related issues. Lower chest: T2  hyperintensity and soft tissue thickening about the right pleural space including on image 12/series 5 may arise from the adjacent rib or be pleural-based. No pleural fluid. Mild cardiomegaly. Hepatobiliary: Grossly normal liver and gallbladder, without biliary duct dilatation. Pancreas: Grossly within normal limits. Spleen: Iron deposition within, as evidenced by decreased signal on long TE images. Adrenals/Urinary Tract: Normal adrenal glands. Grossly normal left kidney. No hydronephrosis. Corresponding to the CT abnormality, off the lower pole right kidney, is a 2.5 cm lesion which demonstrates precontrast T1 and T2 hypointensity. Example image 35/ series 5. After contrast, no convincing evidence of post-contrast enhancement is identified. Stomach/Bowel: Normal stomach and small bowel. The splenic flexure mass is less apparent than on CT. Vascular/Lymphatic: Normal caliber of the aorta and branch vessels. Retroperitoneal adenopathy, as on prior CT. Example at 11 mm in the preaortic space on image 35/ series 5. Other: No ascites. Musculoskeletal: Diffuse osseous metastasis, as evidenced by heterogeneous marrow signal and enhancement. IMPRESSION: 1. Moderately degraded exam secondary to multiple factors as detailed above. 2. Right renal lesion which demonstrates precontrast T1-T2 hypo intensity without definite post-contrast enhancement. This precontrast hypo intensity is indeterminate. Although this could relate to a lipid poor angiomyolipoma, microscopic calcification is favored. Given limitations of the current exam, recommend nonemergent (ideally outpatient) pre and post contrast abdominal CT using renal protocol. 3. Widespread osseous metastasis with probable right pleural extension. As on prior CT, recommend correlation with PSA level and possibly chest radiograph. 4. Splenic iron  deposition. 5. Retroperitoneal adenopathy, suspicious for metastatic disease from 1 of the patient's presumed primary  malignancies. Electronically Signed   By: Abigail Miyamoto M.D.   On: 03/05/2016 17:45   Ct Abdomen Pelvis W Contrast  03/04/2016  CLINICAL DATA:  60 year old male inpatient with a reported history of symptomatic anemia and jejunal polyp and descending colon polyps at endoscopy earlier today. EXAM: CT ABDOMEN AND PELVIS WITH CONTRAST TECHNIQUE: Multidetector CT imaging of the abdomen and pelvis was performed using the standard protocol following bolus administration of intravenous contrast. CONTRAST:  100 cc ISOVUE-300 IOPAMIDOL (ISOVUE-300) INJECTION 61% COMPARISON:  06/19/2007 renal sonogram.  No prior CT abdomen/pelvis. FINDINGS: Lower chest: No significant pulmonary nodules or acute consolidative airspace disease. Mosaic attenuation at the lung bases. Mild cardiomegaly. Hepatobiliary: Normal liver with no liver mass. Normal gallbladder with no radiopaque cholelithiasis. No biliary ductal dilatation. Pancreas: Normal, with no mass or duct dilation. Spleen: Normal size. No mass. 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 Normal left adrenal. No hydronephrosis. Posterior right lower renal 2.2 x 2.0 cm mass with heterogeneous hyperenhancement (series 201/ image 36) with de-enhancement on the delayed sequence. No additional renal masses. Normal bladder. Stomach/Bowel: Grossly normal stomach. Normal caliber small bowel with no small bowel wall thickening. Normal appendix. Colonic mass at the junction of the splenic flexure of the colon and descending colon measuring 2.7 x 2.0 cm (series 301/ image 7), suspicious for primary colonic neoplasm. There a few tiny extraluminal foci of gas adjacent to the splenic flexure of the colon (series 201/ image 15). Otherwise no large bowel wall thickening. No pericolonic fat stranding. Vascular/Lymphatic: Normal caliber abdominal aorta. Patent portal, splenic, hepatic and renal veins. Mildly enlarged 1.2 cm aortocaval node (series 201/ image 33).  Multiple mildly enlarged left para-aortic lymph nodes measuring up to 1.2 cm (series 201/ image 47). Bilateral common iliac lymphadenopathy measuring up to 1.3 cm on the right (series 201/ image 55) and 1.1 cm on the left (series 201/ image 53). Mildly enlarged 1.5 cm left external iliac node (series 201/ image 66). Reproductive: Mildly enlarged prostate measuring 4.2 x 3.5 x 4.2 cm (volume = 32.1 cc). Other: No ascites or focal fluid collection. There a few tiny foci of retroperitoneal gas posterior to the distal duodenum (series 201/image 27). Musculoskeletal: There are extensive confluent sclerotic osseous lesions throughout the entire visualized skeleton involving the entire thoracolumbar spine, bilateral lower ribs and bilateral pelvic girdle. Moderate degenerative changes in the visualized thoracolumbar spine. IMPRESSION: 1. Colonic mass at the junction of the splenic flexure of the colon and descending colon measuring 2.7 x 2.0 cm, suspicious for a primary colonic neoplasm. Recommend correlation with colonoscopy performed earlier today. Tiny foci of extraluminal gas adjacent to the distal duodenum and splenic flexure of the colon are presumably related to endoscopic biopsies performed earlier today. No extraluminal fluid collections. 2. Heterogeneously hyperenhancing 2.2 cm renal mass in the posterior lower right kidney, suspicious for renal cell carcinoma. Recommend further evaluation with renal protocol MRI abdomen without and with IV contrast. 3. Retroperitoneal and bilateral pelvic lymphadenopathy, suspicious for nodal metastases. 4. Extensive confluent sclerotic osseous lesions throughout the entire visualized skeleton, consistent with widespread sclerotic osseous metastases. Recommend correlation with PSA. 5. Mild prostatomegaly. 6. Mild cardiomegaly. 7. Right adrenal myelolipoma. 8. Mosaic attenuation at the lung bases, a nonspecific finding most commonly due to air trapping from small airways disease.  Electronically Signed   By: Ilona Sorrel M.D.   On: 03/04/2016 19:26  Medications: I have reviewed the patient's current medications.  Assessment/Plan: Colonic mass/Small bowel lesion/right kidney 2.2 cm renal mass and sclerotic osseus mets ?prostate cancer-tissue diagnosis pending-patient needs to follow up with Dr. Benson [place] and the surgeons post discharge. Avoid all NSAIDS for now.   LOS: 3 days   Poonam Woehrle 03/06/2016, 9:11 AM

## 2016-03-06 NOTE — Discharge Summary (Signed)
Name: Brandon Peters MRN: [id number] DOB: 1956-05-21 60 y.o. PCP: No Pcp Per Patient  Date of Admission: 03/03/2016  1:42 PM Date of Discharge: 03/06/2016 Attending Physician: Dr. Gilles Chiquito  Discharge Diagnosis: Principal Problem:   Symptomatic anemia Active Problems:   Protein-calorie malnutrition, severe   Prostatic cancer ([place])   Colonic and small bowel masses   Type 2 diabetes   Hypertension  Discharge Medications:   Medication List    STOP taking these medications        aspirin 81 MG EC tablet     carvedilol 20 MG 24 hr capsule  Commonly known as:  COREG CR     losartan 25 MG tablet  Commonly known as:  COZAAR     pravastatin 40 MG tablet  Commonly known as:  PRAVACHOL      TAKE these medications        amLODipine 5 MG tablet  Commonly known as:  NORVASC  Take 1 tablet (5 mg total) by mouth daily.     docusate sodium 100 MG capsule  Commonly known as:  COLACE  Take 1 capsule (100 mg total) by mouth daily.     ferrous sulfate 325 (65 FE) MG tablet  Take 1 tablet (325 mg total) by mouth daily with breakfast.     metFORMIN 500 MG tablet  Commonly known as:  GLUCOPHAGE  Take 1 tablet (500 mg total) by mouth 2 (two) times daily with a meal.       Disposition and follow-up:   Brandon Peters was discharged from [facility] in [place] condition.  At the hospital follow up visit please address:  1.  Prostate Cancer: Patient noted to have enlarged prostate 4.2x3.5x4.2 with numerous apparent metastases in retroperitoneum, bone marrow, possible R pleural extension. PSA of 1531. He also has moderate symptoms of obstructive uropathy not currently on treatment and without nephropathy. Patient to follow with [facility] for further management.  2.  Duodenal mass: Friable fold at the distal duodenum/proximal jejunum concerning for malignancy on endoscopy with high grade dysplasia seen on pathology. Metastases to this site are not  characteristic of primary prostate malignancies so most likely a second primary disease.  3.  R lower lobe renal mass: 2.2cm mass noted on CT with subsequent MRI favoring microcalcifications. Unclear if metastasis or primary RCC currently under observation.  4.  Anemia: His anemia may be more from bone marrow disease versus GI blood loss given no Hx of melena or hematochezia, normal MCV, and elevated ferritin. Started on daily iron supplementation.  5.  Hypertension: Discharged on amlodipine 5mg  with normal BP. Previously on Coreg and losartan years ago likely less treatment required now with substantial weight loss and poor PO intake.  Follow-up Appointments: Follow-up Information    Follow up with Truitt Merle, MD.   Specialties:  Hematology, Oncology   Contact information:   [address] [id number]      Discharge Instructions:  Consultations: Treatment Team:  Carol Ada, MD  Procedures Performed:  Mr Abdomen W Wo Contrast  03/05/2016  CLINICAL DATA:  Right renal lesion on CT.  Colon mass. EXAM: MRI ABDOMEN WITHOUT AND WITH CONTRAST TECHNIQUE: Multiplanar multisequence MR imaging of the abdomen was performed both before and after the administration of intravenous contrast. CONTRAST:  66mL MULTIHANCE GADOBENATE DIMEGLUMINE 529 MG/ML IV SOLN COMPARISON:  CT of 1 day prior FINDINGS: Moderate degradation, secondary to a combination of motion and scanner/technique related issues.  Lower chest: T2 hyperintensity and soft tissue thickening about the right pleural space including on image 12/series 5 may arise from the adjacent rib or be pleural-based. No pleural fluid. Mild cardiomegaly. Hepatobiliary: Grossly normal liver and gallbladder, without biliary duct dilatation. Pancreas: Grossly within normal limits. Spleen: Iron deposition within, as evidenced by decreased signal on long TE images. Adrenals/Urinary Tract: Normal adrenal glands. Grossly normal left kidney. No  hydronephrosis. Corresponding to the CT abnormality, off the lower pole right kidney, is a 2.5 cm lesion which demonstrates precontrast T1 and T2 hypointensity. Example image 35/ series 5. After contrast, no convincing evidence of post-contrast enhancement is identified. Stomach/Bowel: Normal stomach and small bowel. The splenic flexure mass is less apparent than on CT. Vascular/Lymphatic: Normal caliber of the aorta and branch vessels. Retroperitoneal adenopathy, as on prior CT. Example at 11 mm in the preaortic space on image 35/ series 5. Other: No ascites. Musculoskeletal: Diffuse osseous metastasis, as evidenced by heterogeneous marrow signal and enhancement. IMPRESSION: 1. Moderately degraded exam secondary to multiple factors as detailed above. 2. Right renal lesion which demonstrates precontrast T1-T2 hypo intensity without definite post-contrast enhancement. This precontrast hypo intensity is indeterminate. Although this could relate to a lipid poor angiomyolipoma, microscopic calcification is favored. Given limitations of the current exam, recommend nonemergent (ideally outpatient) pre and post contrast abdominal CT using renal protocol. 3. Widespread osseous metastasis with probable right pleural extension. As on prior CT, recommend correlation with PSA level and possibly chest radiograph. 4. Splenic iron deposition. 5. Retroperitoneal adenopathy, suspicious for metastatic disease from 1 of the patient's presumed primary malignancies. Electronically Signed   By: Abigail Miyamoto M.D.   On: 03/05/2016 17:45   Ct Abdomen Pelvis W Contrast  03/04/2016  CLINICAL DATA:  60 year old male inpatient with a reported history of symptomatic anemia and jejunal polyp and descending colon polyps at endoscopy earlier today. EXAM: CT ABDOMEN AND PELVIS WITH CONTRAST TECHNIQUE: Multidetector CT imaging of the abdomen and pelvis was performed using the standard protocol following bolus administration of intravenous contrast.  CONTRAST:  100 cc ISOVUE-300 IOPAMIDOL (ISOVUE-300) INJECTION 61% COMPARISON:  06/19/2007 renal sonogram.  No prior CT abdomen/pelvis. FINDINGS: Lower chest: No significant pulmonary nodules or acute consolidative airspace disease. Mosaic attenuation at the lung bases. Mild cardiomegaly. Hepatobiliary: Normal liver with no liver mass. Normal gallbladder with no radiopaque cholelithiasis. No biliary ductal dilatation. Pancreas: Normal, with no mass or duct dilation. Spleen: Normal size. No mass. 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 Normal left adrenal. No hydronephrosis. Posterior right lower renal 2.2 x 2.0 cm mass with heterogeneous hyperenhancement (series 201/ image 36) with de-enhancement on the delayed sequence. No additional renal masses. Normal bladder. Stomach/Bowel: Grossly normal stomach. Normal caliber small bowel with no small bowel wall thickening. Normal appendix. Colonic mass at the junction of the splenic flexure of the colon and descending colon measuring 2.7 x 2.0 cm (series 301/ image 7), suspicious for primary colonic neoplasm. There a few tiny extraluminal foci of gas adjacent to the splenic flexure of the colon (series 201/ image 15). Otherwise no large bowel wall thickening. No pericolonic fat stranding. Vascular/Lymphatic: Normal caliber abdominal aorta. Patent portal, splenic, hepatic and renal veins. Mildly enlarged 1.2 cm aortocaval node (series 201/ image 33). Multiple mildly enlarged left para-aortic lymph nodes measuring up to 1.2 cm (series 201/ image 47). Bilateral common iliac lymphadenopathy measuring up to 1.3 cm on the right (series 201/ image 55) and 1.1 cm on the left (series 201/  image 53). Mildly enlarged 1.5 cm left external iliac node (series 201/ image 66). Reproductive: Mildly enlarged prostate measuring 4.2 x 3.5 x 4.2 cm (volume = 32.1 cc). Other: No ascites or focal fluid collection. There a few tiny foci of retroperitoneal gas  posterior to the distal duodenum (series 201/image 27). Musculoskeletal: There are extensive confluent sclerotic osseous lesions throughout the entire visualized skeleton involving the entire thoracolumbar spine, bilateral lower ribs and bilateral pelvic girdle. Moderate degenerative changes in the visualized thoracolumbar spine. IMPRESSION: 1. Colonic mass at the junction of the splenic flexure of the colon and descending colon measuring 2.7 x 2.0 cm, suspicious for a primary colonic neoplasm. Recommend correlation with colonoscopy performed earlier today. Tiny foci of extraluminal gas adjacent to the distal duodenum and splenic flexure of the colon are presumably related to endoscopic biopsies performed earlier today. No extraluminal fluid collections. 2. Heterogeneously hyperenhancing 2.2 cm renal mass in the posterior lower right kidney, suspicious for renal cell carcinoma. Recommend further evaluation with renal protocol MRI abdomen without and with IV contrast. 3. Retroperitoneal and bilateral pelvic lymphadenopathy, suspicious for nodal metastases. 4. Extensive confluent sclerotic osseous lesions throughout the entire visualized skeleton, consistent with widespread sclerotic osseous metastases. Recommend correlation with PSA. 5. Mild prostatomegaly. 6. Mild cardiomegaly. 7. Right adrenal myelolipoma. 8. Mosaic attenuation at the lung bases, a nonspecific finding most commonly due to air trapping from small airways disease. Electronically Signed   By: Ilona Sorrel M.D.   On: 03/04/2016 19:26   Admission HPI: 60 y/o man with PHMx significant for hypertension, type 2 diabetes, and hyperlipidemia presents for a Hgb of 4.0 seen at his doctor's office yesterday with worsening fatigue and dyspnea on exertion. This is insidious in onset and progressive over the past few months he feels since December. His appetite remains fair but he thinks he is eating about half as much as normal due to food not tasting normal  any more. He denies noticing any melena or hematochezia. He denies any n/v/d or abdominal pain. He has never had previous colonoscopy but has been seen by Dr. Benson Norway several years ago. He has no history of hemorrhoids or other GI bleeding. He has no cough or shortness of breath at rest and tolerates walking about to his mailbox and back. There is mild swelling in his feet and ankles that he feels is new. Heme positive stool noted in the ED with otherwise unremarkable rectal examination. Blood transfusion started for symptomatic anemia.  He also reports a weight loss of up to 30-40 lbs since December. His last documented weight in our system was 186 lbs in 2012 now weighing 155 lbs. Of note he was previously followed with Dr. Mack[facility] but has not seen them regularly for about 4 years since he lost his job and takes no medicine at home besides a daily aspirin. He was previously on coreg, losartan, amlodipine, and metformin.  He is a never smoker and denies drinking any alcohol.   Hospital Course by problem list: Symptomatic anemia: Hgb of 4.0 on presentation with severe dyspnea on exertion and pallor on examination. This improved to 8.6 with 3 units pRBCs transfused overnight. His symptoms were almost entirely improved with correction of his anemia. Started on ferrous sulfate 325mg  for his current anemia although not iron deficient with ferritin of 724 and MCV of 96.  Metastatic prostate cancer: Abdominal CT obtained after endoscopy revealed an enlarged prostate at 4.2x3.5x42cm with diffuse sclerotic osseus lesions. PSA was checked at 1531. Consulted  oncology service with plan to start anti-androgen therapy immediately and follow up outpatient following hospitalization. Some symptoms of obstructive uropathy that are mild in the absence of obstructive nephropathy.  GI masses: Upper and lower endoscopy was performed which demonstrated a large colonic tubulovillous adenomas not amenable to endoscopic  resection and a friable lesion with high grade dysplasia in the proximal jejunum that were biopsied. Stools were heme+ although continued to have no melena/hematochezia/diarrhea throughout his admission. Ocology service to discuss workup and treatment plan for likely second primary disease.  R lower lobe renal mass: 2.2cm mass noted on CT with subsequent MRI favoring microcalcifications.  Hypertension: Patient moderately hypertensive in 150s-160s SBP on presentation off all medications for years. Amlodipine 5mg  was started and mild improvement to 120s SBP during admission.  Type 2 diabetes mellitus: Hgb A1c was checked and 7.6%. No neuropathy, retinopathy, or nephropathy symptoms observed consistent with microvascular complications. He was previously on metformin 500mg  BID years ago so this medication was restarted at discharge.  Discharge Vitals:   BP 128/74 mmHg  Pulse 75  Temp(Src) 97.7 F (36.5 C) (Oral)  Resp 16  Ht 5\' 5"  (1.651 m)  Wt 70.988 kg (156 lb 8 oz)  BMI 26.04 kg/m2  SpO2 100%  Discharge Labs:  No results found for this or any previous visit (from the past 24 hour(s)).  Signed: Collier Salina, MD 03/11/2016, 10:37 AM

## 2016-03-06 NOTE — Progress Notes (Signed)
Subjective: Pt has no complaints, denies SOB.   Objective: Vital signs in last 24 hours: Filed Vitals:   03/05/16 1553 03/05/16 1950 03/06/16 0531 03/06/16 0745  BP: 150/81 141/76 112/60 128/74  Pulse: 99 80 79 75  Temp: 97.5 F (36.4 C) 97.8 F (36.6 C) 97.9 F (36.6 C) 97.7 F (36.5 C)  TempSrc: Oral Oral Oral Oral  Resp: 18 16 16 16   Height:      Weight:      SpO2: 99% 98% 100% 100%   Weight change:   Intake/Output Summary (Last 24 hours) at 03/06/16 0920 Last data filed at 03/05/16 2242  Gross per 24 hour  Intake    600 ml  Output      1 ml  Net    599 ml   GENERAL- alert, co-operative, NAD HEENT- Temporal wasting present, moist mucous membranes CARDIAC- RRR, 2/6 early systolic murmur RESP- CTAB, no wheezes or crackles ABDOMEN- Soft, nontender, no guarding or rebound, normoactive bowel sounds present PSYCH- Normal mood and affect, appropriate thought content and speech SKIN - warm and dry EXT- SCDs in place  Lab Results: Basic Metabolic Panel:  Recent Labs Lab 03/04/16 0648 03/05/16 0454  NA 136 138  K 3.3* 3.6  CL 104 100*  CO2 24 24  GLUCOSE 201* 147*  BUN 9 <5*  CREATININE 0.62 0.66  CALCIUM 9.1 9.4   Liver Function Tests:  Recent Labs Lab 03/04/16 0648 03/05/16 0454  AST 19 17  ALT 10* 12*  ALKPHOS 279* 290*  BILITOT 0.7 0.7  PROT 6.6 7.0  ALBUMIN 2.8* 2.8*   CBC:  Recent Labs Lab 03/05/16 0454 03/06/16 0438  WBC 5.7 4.8  HGB 8.9* 7.9*  HCT 27.5* 24.6*  MCV 88.1 90.4  PLT 241 195   CBG:  Recent Labs Lab 03/04/16 2010 03/05/16 0743 03/05/16 1155 03/05/16 1617 03/05/16 1947 03/06/16 0741  GLUCAP 154* 138* 143* 118* 130* 119*   Anemia Panel:  Recent Labs Lab 03/03/16 1654  FERRITIN 724*  RETICCTPCT 2.1   Urinalysis:  Recent Labs Lab 03/04/16 0020  COLORURINE YELLOW  LABSPEC 1.017  PHURINE 5.5  GLUCOSEU 500*  HGBUR NEGATIVE  BILIRUBINUR NEGATIVE  KETONESUR NEGATIVE  PROTEINUR NEGATIVE  NITRITE  NEGATIVE  LEUKOCYTESUR NEGATIVE  Mr Abdomen W Wo Contrast  03/05/2016  CLINICAL DATA:  Right renal lesion on CT.  Colon mass. EXAM: MRI ABDOMEN WITHOUT AND WITH CONTRAST TECHNIQUE: Multiplanar multisequence MR imaging of the abdomen was performed both before and after the administration of intravenous contrast. CONTRAST:  71mL MULTIHANCE GADOBENATE DIMEGLUMINE 529 MG/ML IV SOLN COMPARISON:  CT of 1 day prior FINDINGS: Moderate degradation, secondary to a combination of motion and scanner/technique related issues. Lower chest: T2 hyperintensity and soft tissue thickening about the right pleural space including on image 12/series 5 may arise from the adjacent rib or be pleural-based. No pleural fluid. Mild cardiomegaly. Hepatobiliary: Grossly normal liver and gallbladder, without biliary duct dilatation. Pancreas: Grossly within normal limits. Spleen: Iron deposition within, as evidenced by decreased signal on long TE images. Adrenals/Urinary Tract: Normal adrenal glands. Grossly normal left kidney. No hydronephrosis. Corresponding to the CT abnormality, off the lower pole right kidney, is a 2.5 cm lesion which demonstrates precontrast T1 and T2 hypointensity. Example image 35/ series 5. After contrast, no convincing evidence of post-contrast enhancement is identified. Stomach/Bowel: Normal stomach and small bowel. The splenic flexure mass is less apparent than on CT. Vascular/Lymphatic: Normal caliber of the aorta and branch vessels. Retroperitoneal  adenopathy, as on prior CT. Example at 11 mm in the preaortic space on image 35/ series 5. Other: No ascites. Musculoskeletal: Diffuse osseous metastasis, as evidenced by heterogeneous marrow signal and enhancement. IMPRESSION: 1. Moderately degraded exam secondary to multiple factors as detailed above. 2. Right renal lesion which demonstrates precontrast T1-T2 hypo intensity without definite post-contrast enhancement. This precontrast hypo intensity is indeterminate.  Although this could relate to a lipid poor angiomyolipoma, microscopic calcification is favored. Given limitations of the current exam, recommend nonemergent (ideally outpatient) pre and post contrast abdominal CT using renal protocol. 3. Widespread osseous metastasis with probable right pleural extension. As on prior CT, recommend correlation with PSA level and possibly chest radiograph. 4. Splenic iron deposition. 5. Retroperitoneal adenopathy, suspicious for metastatic disease from 1 of the patient's presumed primary malignancies. Electronically Signed   By: Abigail Miyamoto M.D.   On: 03/05/2016 17:45   Ct Abdomen Pelvis W Contrast  03/04/2016  CLINICAL DATA:  60 year old male inpatient with a reported history of symptomatic anemia and jejunal polyp and descending colon polyps at endoscopy earlier today. EXAM: CT ABDOMEN AND PELVIS WITH CONTRAST TECHNIQUE: Multidetector CT imaging of the abdomen and pelvis was performed using the standard protocol following bolus administration of intravenous contrast. CONTRAST:  100 cc ISOVUE-300 IOPAMIDOL (ISOVUE-300) INJECTION 61% COMPARISON:  06/19/2007 renal sonogram.  No prior CT abdomen/pelvis. FINDINGS: Lower chest: No significant pulmonary nodules or acute consolidative airspace disease. Mosaic attenuation at the lung bases. Mild cardiomegaly. Hepatobiliary: Normal liver with no liver mass. Normal gallbladder with no radiopaque cholelithiasis. No biliary ductal dilatation. Pancreas: Normal, with no mass or duct dilation. Spleen: Normal size. No mass. 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 Normal left adrenal. No hydronephrosis. Posterior right lower renal 2.2 x 2.0 cm mass with heterogeneous hyperenhancement (series 201/ image 36) with de-enhancement on the delayed sequence. No additional renal masses. Normal bladder. Stomach/Bowel: Grossly normal stomach. Normal caliber small bowel with no small bowel wall thickening. Normal appendix.  Colonic mass at the junction of the splenic flexure of the colon and descending colon measuring 2.7 x 2.0 cm (series 301/ image 7), suspicious for primary colonic neoplasm. There a few tiny extraluminal foci of gas adjacent to the splenic flexure of the colon (series 201/ image 15). Otherwise no large bowel wall thickening. No pericolonic fat stranding. Vascular/Lymphatic: Normal caliber abdominal aorta. Patent portal, splenic, hepatic and renal veins. Mildly enlarged 1.2 cm aortocaval node (series 201/ image 33). Multiple mildly enlarged left para-aortic lymph nodes measuring up to 1.2 cm (series 201/ image 47). Bilateral common iliac lymphadenopathy measuring up to 1.3 cm on the right (series 201/ image 55) and 1.1 cm on the left (series 201/ image 53). Mildly enlarged 1.5 cm left external iliac node (series 201/ image 66). Reproductive: Mildly enlarged prostate measuring 4.2 x 3.5 x 4.2 cm (volume = 32.1 cc). Other: No ascites or focal fluid collection. There a few tiny foci of retroperitoneal gas posterior to the distal duodenum (series 201/image 27). Musculoskeletal: There are extensive confluent sclerotic osseous lesions throughout the entire visualized skeleton involving the entire thoracolumbar spine, bilateral lower ribs and bilateral pelvic girdle. Moderate degenerative changes in the visualized thoracolumbar spine. IMPRESSION: 1. Colonic mass at the junction of the splenic flexure of the colon and descending colon measuring 2.7 x 2.0 cm, suspicious for a primary colonic neoplasm. Recommend correlation with colonoscopy performed earlier today. Tiny foci of extraluminal gas adjacent to the distal duodenum and splenic flexure of the  colon are presumably related to endoscopic biopsies performed earlier today. No extraluminal fluid collections. 2. Heterogeneously hyperenhancing 2.2 cm renal mass in the posterior lower right kidney, suspicious for renal cell carcinoma. Recommend further evaluation with renal  protocol MRI abdomen without and with IV contrast. 3. Retroperitoneal and bilateral pelvic lymphadenopathy, suspicious for nodal metastases. 4. Extensive confluent sclerotic osseous lesions throughout the entire visualized skeleton, consistent with widespread sclerotic osseous metastases. Recommend correlation with PSA. 5. Mild prostatomegaly. 6. Mild cardiomegaly. 7. Right adrenal myelolipoma. 8. Mosaic attenuation at the lung bases, a nonspecific finding most commonly due to air trapping from small airways disease. Electronically Signed   By: Ilona Sorrel M.D.   On: 03/04/2016 19:26    Medications: I have reviewed the patient's current medications. Scheduled Meds: . amLODipine  5 mg Oral Daily  . Chlorhexidine Gluconate Cloth  6 each Topical Daily  . feeding supplement (GLUCERNA SHAKE)  237 mL Oral TID BM  . insulin aspart  0-9 Units Subcutaneous TID WC  . mupirocin ointment  1 application Nasal BID  . pantoprazole  40 mg Oral Daily  . sodium chloride flush  3 mL Intravenous Q12H   Continuous Infusions:   PRN Meds:.acetaminophen Assessment/Plan:   Metastatic prostate cancer: 30-40 lb weight loss over what is reportedly the past 6 month. Colonoscopy revealed two 15 to 30 mm polyps in the descending colon that were biopsied. A jejunal mass was biopsied during small bowel endoscopy. CT abd w/ 2.7x2.0j cm colonic mass, 2.2cm renal mass in posterior lower right kidney, pelvic lymphadenopathy, extensive confluent sclerotic osseous lesions throughout the entire visualized skeleton, prostatomegaly, rt adrenal myelolipoma. MRI unable to accurately characterize renal mass, recommend non urgent CT in the future, confirmed sclerotic bone lesions. PSA elevated at 1531 - GI following, appreciate their recommendations - discussed pt with Dr. Truitt Merle w/ heme/onc who will arrange outpatient follow for him. Family meeting at 11:00am to discuss discharge today, confirmed w/ cynthia Duggin over the  phone  Anemia: hgb 7.9 from 8.9 yesterday, he received 3 units of pRBC 3 days ago fo hgb of 4.0, likely low hgb from equilibration. Will start on iron supplements w/ colace, retic counts was nl on admission.  Hypertension: controlled -norvasc 5mg  today   Type 2 diabetes mellitus: hgbA1c this admission 7.6 -Carb modified diet -SSI-S -CBG monitoring - can send pt home w/ metformin 500mg  BID which he was on before  FULL CODE Diet: Clears pending endoscopy VTE ppx: SCDs in setting of acute bleed  Dispo: discharge home today  The patient does not know have a current PCP (No primary care provider on file.) and does need an [facility] hospital follow-up appointment after discharge. The patient does not know have transportation limitations that hinder transportation to clinic appointments.   LOS: 3 days   Norman Herrlich, MD 03/06/2016, 9:20 AM

## 2016-03-06 NOTE — Discharge Instructions (Signed)
Start taking ferrous sulfate once a day. This can cause constipation, you can take colace for this.   Start taking metformin 500 mg twice a day for your diabetes.   Start taking norvasc 5mg  for your blood pressure.   If you do not hear from the oncology doctor for an appointment by Tuesday, please call their office at  [phone] to schedule an appointment.

## 2016-03-07 ENCOUNTER — Telehealth: Payer: Self-pay | Admitting: Oncology

## 2016-03-07 NOTE — Telephone Encounter (Signed)
PT AWARE OF NP APPT. ON 03/09/16@11 :00

## 2016-03-09 ENCOUNTER — Telehealth: Payer: Self-pay | Admitting: Oncology

## 2016-03-09 ENCOUNTER — Encounter: Payer: Self-pay | Admitting: Oncology

## 2016-03-09 ENCOUNTER — Other Ambulatory Visit: Payer: Self-pay | Admitting: *Deleted

## 2016-03-09 ENCOUNTER — Ambulatory Visit (HOSPITAL_BASED_OUTPATIENT_CLINIC_OR_DEPARTMENT_OTHER): Payer: Self-pay | Admitting: Oncology

## 2016-03-09 VITALS — BP 158/85 | HR 102 | Temp 97.6°F | Resp 18 | Ht 65.0 in | Wt 152.8 lb

## 2016-03-09 DIAGNOSIS — Q539 Undescended testicle, unspecified: Secondary | ICD-10-CM

## 2016-03-09 DIAGNOSIS — C61 Malignant neoplasm of prostate: Secondary | ICD-10-CM

## 2016-03-09 DIAGNOSIS — C7951 Secondary malignant neoplasm of bone: Secondary | ICD-10-CM

## 2016-03-09 DIAGNOSIS — N289 Disorder of kidney and ureter, unspecified: Secondary | ICD-10-CM

## 2016-03-09 DIAGNOSIS — R634 Abnormal weight loss: Secondary | ICD-10-CM

## 2016-03-09 DIAGNOSIS — E119 Type 2 diabetes mellitus without complications: Secondary | ICD-10-CM

## 2016-03-09 DIAGNOSIS — C801 Malignant (primary) neoplasm, unspecified: Secondary | ICD-10-CM

## 2016-03-09 DIAGNOSIS — D649 Anemia, unspecified: Secondary | ICD-10-CM

## 2016-03-09 DIAGNOSIS — R630 Anorexia: Secondary | ICD-10-CM

## 2016-03-09 DIAGNOSIS — D126 Benign neoplasm of colon, unspecified: Secondary | ICD-10-CM

## 2016-03-09 MED ORDER — BICALUTAMIDE 50 MG PO TABS: 50.0000 mg | ORAL_TABLET | Freq: Every day | ORAL | Status: DC

## 2016-03-09 NOTE — Progress Notes (Signed)
Received call from [place] concerning uninsured patient. Reviewed patient's chart and found that he had a recent admission and [facility] [employer] Financial Counselor(Shavaughn) met with patient during inpatient stay. Reviewed her notes which showed Medicaid potential and then eligible for FAP. Emailed her to inquire about what had been done and what was still needed from patient. Met with patient and family regarding being uninsured and assistance with treatment. Advised them he would automatically receive 55% discount for being uninsured and that the [place] was to determine if he is eligible for any more assistance. [place] emailed back and states she mailed a letter letting him know what documents needed to be submitted to complete application and he was not eligible for Medicaid. Went over this with patient and family and asked if they could bring on Tuesday when he returns. They verbalized understanding. Approved patient for one-time $400 [place]. Patient has a copy of approval as well as expenses it covers. Advised patient he may want to reserve these funds to assist with medication until financial assistance decisions are made for drug replacement . Spoke with Raquel to research drug replacement options. Patient was given application for Lupron and will bring back on Tuesday as well. Patient had several questions about the different programs and grants. I was able to answer them until he was comfortable. Lavella Lemons provided him information on the drugs and his schedule. Patient has my card for any additional financial questions or concerns .Also, advised patient he may reapply for Medicaid once he gets a balance with bills to submit. Raquel has another application for him for assistance with the oral chemo that we will be sure he gets on Tuesday.

## 2016-03-09 NOTE — Progress Notes (Signed)
Brandon Peters New Patient Consult   Referring MD: Brandon Ada, Md [address] [place], [place] [address]   Brandon Peters 60 y.o.  April 22, 1956    Reason for Referral: Prostate cancer, tubulovillous adenoma, anemia   HPI: [name] reports a history of anorexia/weight loss dating to December 2016. He reports "weakness "for the past one and one half months. He presents emergency room with dyspnea on 03/03/2016. The hemoglobin returned at 4 with an MCV of 96.3 and a reticulocyte count of 36.8. The stool was Hemoccult positive. He was transfused with packed red blood cells and reports feeling better.  Dr. Benson Peters was consulted. He was taken to a colonoscopy on 03/04/2016. Polyps were found at the descending colon. The largest polyp was at 47 cm from anal verge. The area was tattooed. The polyps were biopsied. An upper endoscopy revealed a large fungating polypoid mass with no bleeding in the proximal jejunum. Biopsies were taken. The esophagus, stomach, and duodenum appeared normal.  The pathology [id number]) confirmed fragments of small bowel because of with focal high-grade dysplasia. The colon polyps biopsies returned as fragments of a tubular villous adenoma with no high-grade dysplasia or malignancy identified.  He was referred for a CT of the abdomen and pelvis on 03/04/2016. The liver appeared normal. A posterior right renal mass measured 2.2 x 2 cm. A mass was noted at the junction of the splenic flexure and descending colon suspicious for primary colonic neoplasm. A few tiny foci of extraluminal gas were noted adjacent to the splenic flexure. Mildly enlarged aortocaval and para-aortic nodes in addition to iliac lymphadenopathy. The prostate is mildly enlarged. Extensive sclerotic osseous lesions were noted.  An MRI of the abdomen on 03/05/2016 revealed a 2.570 right renal lesion without postcontrast enhancement. Widespread bone metastases were noted in addition  to retroperitoneal adenopathy.  The PSA returned at 1531   Past Medical History  Diagnosis Date  . RENAL CALCULUS, HX OF 06/26/2007  . ARF (acute renal failure) (HCC)     d/t lisinopril and/or diazide- was on both in a 2 week period- cr up to 4.99  . Hypertension   . Hyperlipidemia   . Diabetes mellitus     .  Severe anemia-June 2017   .  Distal duodenal and descending colon masses  -June 2017   .  Undescended testicles  Past Surgical History  Procedure Laterality Date  . Colonoscopy N/A 03/04/2016    Procedure: COLONOSCOPY;  Surgeon: Brandon Ada, MD;  Location: [facility] ENDOSCOPY;  Service: Endoscopy;  Laterality: N/A;  . Enteroscopy N/A 03/04/2016    Procedure: ENTEROSCOPY;  Surgeon: Brandon Ada, MD;  Location: [place];  Service: Endoscopy;  Laterality: N/A;    Medications: Reviewed  Allergies:  Allergies  Allergen Reactions  . Penicillins Other (See Comments)    Pt states that he cannot really remember reaction. Tentatively reports swelling/hives    Family history: His mother had lung cancer and was a smoker. A sister had polyps. He had 5 siblings. No other family history of cancer.  Social History:   He lives alone in [place]. He is unemployed. He previously worked in a [employer], educational job. He does not use cigarettes or alcohol.    ROS:   Positives include: Anorexia, 50 pound weight loss, decreased urinary stream, nocturia, pain with urination, swollen feet, exertional dyspnea prior to hospital admission June 2017  A complete ROS was otherwise negative.  Physical Exam:  Blood pressure 158/85, pulse 102, temperature 97.6 F (36.4 C),  temperature source Oral, resp. rate 18, height 5\' 5"  (1.651 m), weight 152 lb 12.8 oz (69.31 kg), SpO2 100 %.  HEENT: Edentulous, oropharynx without visible mass, neck without mass Lungs: Clear bilaterally Cardiac: Regular rate and rhythm Abdomen: No hepatomegaly, no mass, nontender GU: The testes are not palpable  Vascular:  Trace low pretibial edema bilaterally Lymph nodes: No cervical, supra-clavicular, axillary, or inguinal nodes Neurologic: Alert and oriented, the motor exam appears intact in the upper and lower extremities Skin: Hyperpigmentation in the groin and bilateral axillary areas Musculoskeletal: No spine tenderness   LAB:  CBC  Lab Results  Component Value Date   WBC 4.8 03/06/2016   HGB 7.9* 03/06/2016   HCT 24.6* 03/06/2016   MCV 90.4 03/06/2016   PLT 195 03/06/2016     CMP      Component Value Date/Time   NA 138 03/05/2016 0454   K 3.6 03/05/2016 0454   CL 100* 03/05/2016 0454   CO2 24 03/05/2016 0454   GLUCOSE 147* 03/05/2016 0454   BUN <5* 03/05/2016 0454   CREATININE 0.66 03/05/2016 0454   CREATININE 0.95 06/29/2011 0852   CALCIUM 9.4 03/05/2016 0454   PROT 7.0 03/05/2016 0454   ALBUMIN 2.8* 03/05/2016 0454   AST 17 03/05/2016 0454   ALT 12* 03/05/2016 0454   ALKPHOS 290* 03/05/2016 0454   BILITOT 0.7 03/05/2016 0454   GFRNONAA >60 03/05/2016 0454   GFRAA >60 03/05/2016 0454    No results found for: CEA1  Imaging:  MRI abdomen 03/05/2016 and CT abdomen/pelvis 03/04/2016-images reviewed  I reviewed the CT images with [name] and his sisters   Assessment/Plan:   1. Severe anemia-status post a red cell transfusion 03/04/2016  2. Prostate cancer  Extensive sclerotic bone metastases noted on a CT of the abdomen/pelvis 03/04/2016 and MRI abdomen 03/05/2016  Markedly elevated PSA  3.   Symptoms of obstructive uropathy  4.   Distal duodenal and descending colon masses  biopsy of the duodenal mass 03/04/2016 confirmed fragments of small bowel mucosa with focal high-grade dysplasia  Biopsy of the descending colon "polyps "revealed fragments of a tubulovillous adenoma  5.  Anorexia/weight loss  6.  Undescended testicles  7.  Right renal mass  8.  Diabetes   Disposition:   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 I suspect the anemia is related to prostate cancer involving the bone marrow as opposed to GI bleeding.  He will begin anti-androgen therapy today. I will make a referral to urology to consider a prostate biopsy and for management of obstructive symptoms. We will start treatment with Degarelix or luupron and add abiraterone within the next few weeks.  I will present his case at the GI tumor conference to discuss management of the distal duodenal and descending colon lesions. We will likely observe these for now.  He may have a metastasis to the right kidney or a right renal cell carcinoma. I also think this should be observed for now.  We will begin xgeva when he returns for follow-up next week.  He will be referred to the [place] financial counselors and social worker for assistance with obtaining medical insurance.  Approximately 50 minutes were spent with patient today. The majority of the time was used for counseling and correlation of care.  [name], MD  03/09/2016, 12:27 PM

## 2016-03-09 NOTE — Progress Notes (Signed)
No dates for treatment as of today. °

## 2016-03-09 NOTE — Patient Instructions (Addendum)
Dr. Benay Spice is recommending treatment with Lupron. You will receive treatment in the office. He also recommends Casodex which is a tablet you will take once daily at home.    Bicalutamide tablets What is this medicine? BICALUTAMIDE (bye ka LOO ta mide) blocks the effect of the male hormone testosterone on the prostate. This medicine is used to treat advanced prostate cancer in men. It is given with other treatments. This medicine may be used for other purposes; ask your health care provider or pharmacist if you have questions. What should I tell my health care provider before I take this medicine? They need to know if you have any of these conditions: -if you are male (this medicine is not for use in women) -liver disease -an unusual or allergic reaction to bicalutamide, other medicines, foods, dyes, or preservatives How should I use this medicine? Take this medicine by mouth with a glass of water. You may take it with or without food. Follow the directions on the prescription label. Take your medicine at regular intervals. Do not take your medicine more often than directed. Do not stop taking except on your doctor's advice. Talk to your pediatrician regarding the use of this medicine in children. Special care may be needed. Overdosage: If you think you have taken too much of this medicine contact a poison control center or emergency room at once. NOTE: This medicine is only for you. Do not share this medicine with others. What if I miss a dose? If you miss a dose, take it as soon as you can. If it is almost time for your next dose, take only that dose. Do not take double or extra doses. What may interact with this medicine? -warfarin This list may not describe all possible interactions. Give your health care provider a list of all the medicines, herbs, non-prescription drugs, or dietary supplements you use. Also tell them if you smoke, drink alcohol, or use illegal drugs. Some items may  interact with your medicine. What should I watch for while using this medicine? Visit your doctor or health care professional for regular checks on your progress. You may need regular tests to make sure your liver is working properly. This medicine should not be used in women. Serious side effects to an unborn child are possible. Talk to your doctor or pharmacist for more information. What side effects may I notice from receiving this medicine? Side effects that you should report to your doctor or health care professional as soon as possible: - allergic reactions like skin rash, itching or hives, swelling of the face, lips, or tongue - blood in the urine - breathing problems - chest pain - dark urine - severe nausea and vomiting - yellowing of the eyes or skin Side effects that usually do not require medical attention (report to your doctor or health care professional if they continue or are bothersome): - diarrhea - hot flashes - loss of appetite - nausea -sensitivity to light - weak or tired This list may not describe all possible side effects. Call your doctor for medical advice about side effects. You may report side effects to FDA at 1-800-FDA-1088. Where should I keep my medicine? Keep out of the reach of children. Store between 20 and 25 degrees C (68 and 77 degrees F). Throw away any unused medicine after the expiration date. NOTE: This sheet is a summary. It may not cover all possible information. If you have questions about this medicine, talk to your doctor, pharmacist, or  health care provider.    2016, Elsevier/Gold Standard. (2013-12-05 16:44:05) Leuprolide depot injection What is this medicine? LEUPROLIDE (loo PROE lide) is a man-made protein that acts like a natural hormone in the body. It decreases testosterone in men and decreases estrogen in women. In men, this medicine is used to treat advanced prostate cancer. In women, some forms of this medicine may be  used to treat endometriosis, uterine fibroids, or other male hormone-related problems. This medicine may be used for other purposes; ask your health care provider or pharmacist if you have questions. What should I tell my health care provider before I take this medicine? They need to know if you have any of these conditions: -diabetes -heart disease or previous heart attack -high blood pressure -high cholesterol -osteoporosis -pain or difficulty passing urine -spinal cord metastasis -stroke -tobacco smoker -unusual vaginal bleeding (women) -an unusual or allergic reaction to leuprolide, benzyl alcohol, other medicines, foods, dyes, or preservatives -pregnant or trying to get pregnant -breast-feeding How should I use this medicine? This medicine is for injection into a muscle or for injection under the skin. It is given by a health care professional in a hospital or clinic setting. The specific product will determine how it will be given to you. Make sure you understand which product you receive and how often you will receive it. Talk to your pediatrician regarding the use of this medicine in children. Special care may be needed. Overdosage: If you think you have taken too much of this medicine contact a poison control center or emergency room at once. NOTE: This medicine is only for you. Do not share this medicine with others. What if I miss a dose? It is important not to miss a dose. Call your doctor or health care professional if you are unable to keep an appointment. Depot injections: Depot injections are given either once-monthly, every 12 weeks, every 16 weeks, or every 24 weeks depending on the product you are prescribed. The product you are prescribed will be based on if you are male or male, and your condition. Make sure you understand your product and dosing. What may interact with this medicine? Do not take this medicine with any of the following  medications: -chasteberry This medicine may also interact with the following medications: -herbal or dietary supplements, like black cohosh or DHEA -male hormones, like estrogens or progestins and birth control pills, patches, rings, or injections -male hormones, like testosterone This list may not describe all possible interactions. Give your health care provider a list of all the medicines, herbs, non-prescription drugs, or dietary supplements you use. Also tell them if you smoke, drink alcohol, or use illegal drugs. Some items may interact with your medicine. What should I watch for while using this medicine? Visit your doctor or health care professional for regular checks on your progress. During the first weeks of treatment, your symptoms may get worse, but then will improve as you continue your treatment. You may get hot flashes, increased bone pain, increased difficulty passing urine, or an aggravation of nerve symptoms. Discuss these effects with your doctor or health care professional, some of them may improve with continued use of this medicine. Male patients may experience a menstrual cycle or spotting during the first months of therapy with this medicine. If this continues, contact your doctor or health care professional. What side effects may I notice from receiving this medicine? Side effects that you should report to your doctor or health care professional as soon as  possible: -allergic reactions like skin rash, itching or hives, swelling of the face, lips, or tongue -breathing problems -chest pain -depression or memory disorders -pain in your legs or groin -pain at site where injected or implanted -severe headache -swelling of the feet and legs -visual changes -vomiting Side effects that usually do not require medical attention (report to your doctor or health care professional if they continue or are bothersome): -breast swelling or tenderness -decrease in sex drive or  performance -diarrhea -hot flashes -loss of appetite -muscle, joint, or bone pains -nausea -redness or irritation at site where injected or implanted -skin problems or acne This list may not describe all possible side effects. Call your doctor for medical advice about side effects. You may report side effects to FDA at 1-800-FDA-1088. Where should I keep my medicine? This drug is given in a hospital or clinic and will not be stored at home. NOTE: This sheet is a summary. It may not cover all possible information. If you have questions about this medicine, talk to your doctor, pharmacist, or health care provider.    2016, Elsevier/Gold Standard. (2014-06-06 14:16:23)

## 2016-03-09 NOTE — Telephone Encounter (Signed)
left msg confirming 6/26 apt times, sent notes to HIM for urology referral

## 2016-03-09 NOTE — Telephone Encounter (Signed)
Per Dr. Benay Spice: Casodex oral tablet is to start on same day as Lupron injection. Pt is scheduled for injection 6/20. Casodex Rx will be taken to oral chemo navigator.

## 2016-03-10 ENCOUNTER — Telehealth: Payer: Self-pay | Admitting: *Deleted

## 2016-03-10 ENCOUNTER — Telehealth: Payer: Self-pay | Admitting: Oncology

## 2016-03-10 NOTE — Telephone Encounter (Signed)
Faxed pt medical records to [facility] urology [phone]

## 2016-03-10 NOTE — Telephone Encounter (Signed)
TC from pt's sister, Caren Griffins regarding a new prescription from Dr. Benay Spice . Bicalutamide (Casodex). She has called [facility] and they have not received the prescription yet.  Med list information has 03/15/16 as start date.  Please clarify and call sister back today please.  [phone]

## 2016-03-10 NOTE — Telephone Encounter (Signed)
Called pt, informed him Dr. Benay Spice has discussed case with Dr. Diona Fanti. Will decide on treatment based on urology recommendation. Pt is scheduled to see urologist on 6/19. (Casodex and Lupron is tentatively planned to begin 6/20)

## 2016-03-15 ENCOUNTER — Ambulatory Visit (HOSPITAL_BASED_OUTPATIENT_CLINIC_OR_DEPARTMENT_OTHER): Payer: Self-pay

## 2016-03-15 ENCOUNTER — Other Ambulatory Visit: Payer: Self-pay | Admitting: Oncology

## 2016-03-15 DIAGNOSIS — C61 Malignant neoplasm of prostate: Secondary | ICD-10-CM

## 2016-03-15 DIAGNOSIS — Z5111 Encounter for antineoplastic chemotherapy: Secondary | ICD-10-CM

## 2016-03-15 DIAGNOSIS — C7951 Secondary malignant neoplasm of bone: Secondary | ICD-10-CM

## 2016-03-15 MED ORDER — LEUPROLIDE ACETATE (3 MONTH) 22.5 MG IM KIT
22.5000 mg | PACK | Freq: Once | INTRAMUSCULAR | Status: DC
  Administered 2016-03-15: 22.5 mg via INTRAMUSCULAR
  Filled 2016-03-15: qty 22.5

## 2016-03-15 MED FILL — BICALUTAMIDE 50 MG TABLET: 50 | 14 days supply | Qty: 14 | Fill #0

## 2016-03-15 NOTE — Patient Instructions (Signed)
Leuprolide depot injection What is this medicine? LEUPROLIDE (loo PROE lide) is a man-made protein that acts like a natural hormone in the body. It decreases testosterone in men and decreases estrogen in women. In men, this medicine is used to treat advanced prostate cancer. In women, some forms of this medicine may be used to treat endometriosis, uterine fibroids, or other male hormone-related problems. This medicine may be used for other purposes; ask your health care provider or pharmacist if you have questions. What should I tell my health care provider before I take this medicine? They need to know if you have any of these conditions: -diabetes -heart disease or previous heart attack -high blood pressure -high cholesterol -osteoporosis -pain or difficulty passing urine -spinal cord metastasis -stroke -tobacco smoker -unusual vaginal bleeding (women) -an unusual or allergic reaction to leuprolide, benzyl alcohol, other medicines, foods, dyes, or preservatives -pregnant or trying to get pregnant -breast-feeding How should I use this medicine? This medicine is for injection into a muscle or for injection under the skin. It is given by a health care professional in a hospital or clinic setting. The specific product will determine how it will be given to you. Make sure you understand which product you receive and how often you will receive it. Talk to your pediatrician regarding the use of this medicine in children. Special care may be needed. Overdosage: If you think you have taken too much of this medicine contact a poison control center or emergency room at once. NOTE: This medicine is only for you. Do not share this medicine with others. What if I miss a dose? It is important not to miss a dose. Call your doctor or health care professional if you are unable to keep an appointment. Depot injections: Depot injections are given either once-monthly, every 12 weeks, every 16 weeks, or  every 24 weeks depending on the product you are prescribed. The product you are prescribed will be based on if you are male or male, and your condition. Make sure you understand your product and dosing. What may interact with this medicine? Do not take this medicine with any of the following medications: -chasteberry This medicine may also interact with the following medications: -herbal or dietary supplements, like black cohosh or DHEA -male hormones, like estrogens or progestins and birth control pills, patches, rings, or injections -male hormones, like testosterone This list may not describe all possible interactions. Give your health care provider a list of all the medicines, herbs, non-prescription drugs, or dietary supplements you use. Also tell them if you smoke, drink alcohol, or use illegal drugs. Some items may interact with your medicine. What should I watch for while using this medicine? Visit your doctor or health care professional for regular checks on your progress. During the first weeks of treatment, your symptoms may get worse, but then will improve as you continue your treatment. You may get hot flashes, increased bone pain, increased difficulty passing urine, or an aggravation of nerve symptoms. Discuss these effects with your doctor or health care professional, some of them may improve with continued use of this medicine. Male patients may experience a menstrual cycle or spotting during the first months of therapy with this medicine. If this continues, contact your doctor or health care professional. What side effects may I notice from receiving this medicine? Side effects that you should report to your doctor or health care professional as soon as possible: -allergic reactions like skin rash, itching or hives, swelling of the   face, lips, or tongue -breathing problems -chest pain -depression or memory disorders -pain in your legs or groin -pain at site where injected or  implanted -severe headache -swelling of the feet and legs -visual changes -vomiting Side effects that usually do not require medical attention (report to your doctor or health care professional if they continue or are bothersome): -breast swelling or tenderness -decrease in sex drive or performance -diarrhea -hot flashes -loss of appetite -muscle, joint, or bone pains -nausea -redness or irritation at site where injected or implanted -skin problems or acne This list may not describe all possible side effects. Call your doctor for medical advice about side effects. You may report side effects to FDA at 1-800-FDA-1088. Where should I keep my medicine? This drug is given in a hospital or clinic and will not be stored at home. NOTE: This sheet is a summary. It may not cover all possible information. If you have questions about this medicine, talk to your doctor, pharmacist, or health care provider.    2016, Elsevier/Gold Standard. (2014-06-06 14:16:23)  

## 2016-03-16 ENCOUNTER — Encounter: Payer: Self-pay | Admitting: Skilled Nursing Facility1

## 2016-03-16 NOTE — Progress Notes (Signed)
Subjective:     Patient ID: Brandon Peters, male   DOB: [birth year], 60 y.o.   MRN: [id number]  HPI   Review of Systems     Objective:   Physical Exam To assist the pt in identifying dietary strategies to gain lost wt.     Assessment:     Pt identified as being malnourished due to lost wt. Pt contacted via the telephone at [phone]. Pts phone had a busy tone after 3 attempts.    Plan:     Dietitian will notify [facility]

## 2016-03-21 ENCOUNTER — Ambulatory Visit (HOSPITAL_BASED_OUTPATIENT_CLINIC_OR_DEPARTMENT_OTHER): Payer: Self-pay | Admitting: Nurse Practitioner

## 2016-03-21 ENCOUNTER — Other Ambulatory Visit (HOSPITAL_BASED_OUTPATIENT_CLINIC_OR_DEPARTMENT_OTHER): Payer: Self-pay

## 2016-03-21 ENCOUNTER — Telehealth: Payer: Self-pay | Admitting: Nurse Practitioner

## 2016-03-21 ENCOUNTER — Ambulatory Visit: Payer: Self-pay

## 2016-03-21 VITALS — BP 169/97 | HR 96 | Temp 98.2°F | Resp 18 | Ht 65.0 in | Wt 148.9 lb

## 2016-03-21 DIAGNOSIS — Q539 Undescended testicle, unspecified: Secondary | ICD-10-CM

## 2016-03-21 DIAGNOSIS — N289 Disorder of kidney and ureter, unspecified: Secondary | ICD-10-CM

## 2016-03-21 DIAGNOSIS — C61 Malignant neoplasm of prostate: Secondary | ICD-10-CM

## 2016-03-21 DIAGNOSIS — D649 Anemia, unspecified: Secondary | ICD-10-CM

## 2016-03-21 DIAGNOSIS — K639 Disease of intestine, unspecified: Secondary | ICD-10-CM

## 2016-03-21 DIAGNOSIS — E119 Type 2 diabetes mellitus without complications: Secondary | ICD-10-CM

## 2016-03-21 DIAGNOSIS — C7951 Secondary malignant neoplasm of bone: Secondary | ICD-10-CM

## 2016-03-21 LAB — CBC WITH DIFFERENTIAL/PLATELET
BASO%: 0.3 % (ref 0.0–2.0)
Basophils Absolute: 0 10*3/uL (ref 0.0–0.1)
EOS%: 1.3 % (ref 0.0–7.0)
Eosinophils Absolute: 0.1 10*3/uL (ref 0.0–0.5)
HCT: 24.2 % — ABNORMAL LOW (ref 38.4–49.9)
HGB: 7.9 g/dL — ABNORMAL LOW (ref 13.0–17.1)
LYMPH%: 36.7 % (ref 14.0–49.0)
MCH: 29.5 pg (ref 27.2–33.4)
MCHC: 32.8 g/dL (ref 32.0–36.0)
MCV: 89.8 fL (ref 79.3–98.0)
MONO#: 0.5 10*3/uL (ref 0.1–0.9)
MONO%: 7.7 % (ref 0.0–14.0)
NEUT%: 54 % (ref 39.0–75.0)
NEUTROS ABS: 3.2 10*3/uL (ref 1.5–6.5)
PLATELETS: 323 10*3/uL (ref 140–400)
RBC: 2.69 10*6/uL — AB (ref 4.20–5.82)
RDW: 17.3 % — ABNORMAL HIGH (ref 11.0–14.6)
WBC: 5.9 10*3/uL (ref 4.0–10.3)
lymph#: 2.2 10*3/uL (ref 0.9–3.3)

## 2016-03-21 NOTE — Progress Notes (Addendum)
  Brandon Peters OFFICE PROGRESS NOTE   Diagnosis:  Prostate cancer, tubulovillous adenoma, anemia  INTERVAL HISTORY:   Brandon Peters returns as scheduled. He began Lupron 03/15/2016. He has had a single hot flash. He denies pain. Bowels are moving. He reports a good appetite. He started a new medication prescribed by urology and has noted improvement in urination. He is unable to recall the name of the medication. He will bring the pill bottle to his next appointment. No shortness of breath. No chest pain. He does not feel he needs a blood transfusion.  Objective:  Vital signs in last 24 hours:  Blood pressure 169/97, pulse 96, temperature 98.2 F (36.8 C), temperature source Oral, resp. rate 18, height 5\' 5"  (1.651 m), weight 148 lb 14.4 oz (67.541 kg), SpO2 99 %.    HEENT: No thrush or ulcers. Resp: Lungs clear bilaterally. Cardio: Regular rate and rhythm. GI: Abdomen soft and nontender. No hepatomegaly. Vascular: No leg edema.   Lab Results:  Lab Results  Component Value Date   WBC 5.9 03/21/2016   HGB 7.9* 03/21/2016   HCT 24.2* 03/21/2016   MCV 89.8 03/21/2016   PLT 323 03/21/2016   NEUTROABS 3.2 03/21/2016    Imaging:  No results found.  Medications: I have reviewed the patient's current medications.  Assessment/Plan: 1. Severe anemia-status post a red cell transfusion 03/04/2016  2. Prostate cancer  Extensive sclerotic bone metastases noted on a CT of the abdomen/pelvis 03/04/2016 and MRI abdomen 03/05/2016  Markedly elevated PSA  Lupron initiated 03/15/2016; Casodex 14 days beginning 03/15/2016  Prostate biopsy 03/18/2016  3. Symptoms of obstructive uropathy. Improved since beginning a new medication prescribed by urology.  4. 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 Anorexia/weight loss  6. Undescended testicles  7. Right renal mass.   8. Diabetes    Disposition: Brandon Peters appears stable. He began Lupron/Casodex 03/15/2016. He underwent a prostate biopsy 03/18/2016. We will follow-up on the final pathology.  He has stable anemia. He is asymptomatic. Signs/symptoms suggestive of progressive anemia reviewed at today's visit. He understands to contact the office should he develop these.  With regard to the distal duodenal and descending colon lesions the plan is for observation. With regard to the right renal mass the plan is for observation.  He will return for a follow-up visit in 3 weeks. He will contact the office in the interim as outlined above or with any other problems.  Patient seen with Dr. Benay Peters.    Brandon Peters ANP/GNP-BC   03/21/2016  3:17 PM  This was a shared visit with Brandon Peters. Brandon Peters underwent a prostate biopsy 03/18/2016. He has started hormonal therapy for treatment of metastatic prostate cancer. Hopefully the anemia will improve over the next few months.  He will return for an office visit in 3 weeks.  Brandon Peters, M.D.

## 2016-03-21 NOTE — Telephone Encounter (Signed)
Gave pt cal & avs °

## 2016-03-28 ENCOUNTER — Encounter: Payer: Self-pay | Admitting: Oncology

## 2016-03-28 NOTE — Progress Notes (Signed)
The patient was able to pick up Casodex from [facility] for low cost using his [place]. May need co-pay assistance if treatment is changed in the future.

## 2016-03-28 NOTE — Progress Notes (Signed)
I sent mess to Dr Benay Spice and Lavella Lemons to see if an alternative to casodex. There are no funds via access 360. The list has cancer care as one(no insurance) the patient can try if no alternative via dr. Benay Spice.

## 2016-04-11 ENCOUNTER — Encounter: Payer: Self-pay | Admitting: Nurse Practitioner

## 2016-04-11 ENCOUNTER — Other Ambulatory Visit (HOSPITAL_BASED_OUTPATIENT_CLINIC_OR_DEPARTMENT_OTHER): Payer: Self-pay

## 2016-04-11 ENCOUNTER — Telehealth: Payer: Self-pay | Admitting: Oncology

## 2016-04-11 ENCOUNTER — Ambulatory Visit (HOSPITAL_BASED_OUTPATIENT_CLINIC_OR_DEPARTMENT_OTHER): Payer: Self-pay | Admitting: Nurse Practitioner

## 2016-04-11 VITALS — BP 152/74 | HR 104 | Temp 98.1°F | Resp 18 | Ht 65.0 in | Wt 150.4 lb

## 2016-04-11 DIAGNOSIS — D649 Anemia, unspecified: Secondary | ICD-10-CM

## 2016-04-11 DIAGNOSIS — C61 Malignant neoplasm of prostate: Secondary | ICD-10-CM

## 2016-04-11 DIAGNOSIS — C7951 Secondary malignant neoplasm of bone: Secondary | ICD-10-CM

## 2016-04-11 LAB — CBC WITH DIFFERENTIAL/PLATELET
BASO%: 0.7 % (ref 0.0–2.0)
Basophils Absolute: 0 10*3/uL (ref 0.0–0.1)
EOS ABS: 0.1 10*3/uL (ref 0.0–0.5)
EOS%: 1.1 % (ref 0.0–7.0)
HCT: 23.4 % — ABNORMAL LOW (ref 38.4–49.9)
HEMOGLOBIN: 7.5 g/dL — AB (ref 13.0–17.1)
LYMPH%: 33.9 % (ref 14.0–49.0)
MCH: 29.6 pg (ref 27.2–33.4)
MCHC: 32 g/dL (ref 32.0–36.0)
MCV: 92.5 fL (ref 79.3–98.0)
MONO#: 0.5 10*3/uL (ref 0.1–0.9)
MONO%: 9.6 % (ref 0.0–14.0)
NEUT%: 54.7 % (ref 39.0–75.0)
NEUTROS ABS: 2.7 10*3/uL (ref 1.5–6.5)
Platelets: 229 10*3/uL (ref 140–400)
RBC: 2.53 10*6/uL — AB (ref 4.20–5.82)
RDW: 19.5 % — AB (ref 11.0–14.6)
WBC: 4.9 10*3/uL (ref 4.0–10.3)
lymph#: 1.7 10*3/uL (ref 0.9–3.3)

## 2016-04-11 NOTE — Telephone Encounter (Signed)
Gave pt cal & avs °

## 2016-04-11 NOTE — Progress Notes (Signed)
  [place] OFFICE PROGRESS NOTE   Diagnosis:  Prostate cancer, tubulovillous adenoma, anemia  INTERVAL HISTORY:   [name] returns as scheduled. He began Lupron 03/15/2016. He overall feels well. He reports a good appetite and good energy level. No shortness of breath. No chest pain. He denies pain. He has had a single hot flash. No nausea or vomiting. Bowels moving regularly. He noted a small amount of blood mixed with urine with morning.  Objective:  Vital signs in last 24 hours:  Blood pressure 152/74, pulse 104, temperature 98.1 F (36.7 C), temperature source Oral, resp. rate 18, height 5\' 5"  (1.651 m), weight 150 lb 6.4 oz (68.221 kg), SpO2 100 %.    HEENT: No thrush or ulcers. Resp: Lungs clear bilaterally. Cardio: Regular rate and rhythm. GI: Abdomen soft and nontender. No hepatomegaly. Vascular: No leg edema.   Lab Results:  Lab Results  Component Value Date   WBC 4.9 04/11/2016   HGB 7.5* 04/11/2016   HCT 23.4* 04/11/2016   MCV 92.5 04/11/2016   PLT 229 04/11/2016   NEUTROABS 2.7 04/11/2016    Imaging:  No results found.  Medications: I have reviewed the patient's current medications.  Assessment/Plan: 1. Severe anemia-status post a red cell transfusion 03/04/2016  2. Prostate cancer  Extensive sclerotic bone metastases noted on a CT of the abdomen/pelvis 03/04/2016 and MRI abdomen 03/05/2016  Markedly elevated PSA  Lupron initiated 03/15/2016; Casodex 14 days beginning 03/15/2016  Prostate biopsy 03/18/2016  3. Symptoms of obstructive uropathy. Improved since beginning a new medication prescribed by urology.  4. 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 Anorexia/weight loss  6. Undescended testicles  7.  Right renal mass.   8. Diabetes   Disposition: Brandon Peters appears stable. He received an initial Lupron injection 03/15/2016. Subsequent injections planned every 12 weeks. We will obtain a repeat PSA when he returns in one month.  He has persistent anemia. He is asymptomatic. He does not feel he needs a blood transfusion at present.  We scheduled a return visit in 4 weeks with a PSA and CBC. He will contact the office in the interim with any problems.   Plan reviewed with Dr. Benay Spice.    Ned Card ANP/GNP-BC   04/11/2016  2:15 PM

## 2016-04-12 ENCOUNTER — Ambulatory Visit: Payer: Self-pay | Admitting: Nutrition

## 2016-04-12 ENCOUNTER — Encounter: Payer: Self-pay | Admitting: Oncology

## 2016-04-12 NOTE — Progress Notes (Signed)
faxed [employer] application for zytiga

## 2016-04-12 NOTE — Progress Notes (Signed)
60 year old male diagnosed with prostate cancer and anemia.  Is a patient of Dr. Julieanne Manson.  Past medical history includes renal calculus, acute renal failure, hypertension, hyperlipidemia, and diabetes.  Medications include ferrous sulfate, Colace, and Glucophage.  Labs were reviewed.  Height: 65 inches. Weight: 150.4 pounds July 17. Usual body weight: 156 pounds. BMI: 25.03.  Patient denies nutrition impact symptoms. He would like to know what kind of diet to follow secondary to prostate cancer and anemia.  Nutrition diagnosis: Food and nutrition related knowledge deficit related to diagnosis of prostate cancer and anemia as evidenced by no prior need for nutrition related information.  Intervention: Educated patient to continue carbohydrate controlled diet with adequate calories and protein to promote weight maintenance. Encouraged increased plant-based foods. Encouraged patient to continue to count carbohydrate foods as previously educated Educated patient on foods high in iron and how to increase iron absorption. Fact sheets were provided on plant-based diet, anemia, and Iron. Provided samples of glucose controlled oral nutrition supplements and coupons. Questions were answered.  Teach back method used.  Monitoring, evaluation, goals: Patient will tolerate adequate calories and protein to minimize weight loss.  Next visit.  Patient contact me with any further questions.

## 2016-05-06 ENCOUNTER — Telehealth: Payer: Self-pay | Admitting: *Deleted

## 2016-05-06 ENCOUNTER — Encounter: Payer: Self-pay | Admitting: Oncology

## 2016-05-06 NOTE — Telephone Encounter (Signed)
Message left from pt wanting to discuss Zytiga with Dr. Benay Spice.  Call placed back to pt to inform him that Dr. Benay Spice will discuss Zytiga with him at [facility] appt.  Pt appreciative of call back.

## 2016-05-06 NOTE — Progress Notes (Unsigned)
Received approval letter from [employer] for Zytiga for up to one year through their patient assistance. Placed a copy in my book and the original in Raquel's box.

## 2016-05-09 ENCOUNTER — Ambulatory Visit (HOSPITAL_BASED_OUTPATIENT_CLINIC_OR_DEPARTMENT_OTHER): Payer: Self-pay | Admitting: Oncology

## 2016-05-09 ENCOUNTER — Other Ambulatory Visit (HOSPITAL_BASED_OUTPATIENT_CLINIC_OR_DEPARTMENT_OTHER): Payer: Self-pay

## 2016-05-09 ENCOUNTER — Other Ambulatory Visit: Payer: Self-pay | Admitting: *Deleted

## 2016-05-09 VITALS — BP 149/79 | HR 104 | Temp 98.1°F | Resp 18 | Ht 65.0 in | Wt 149.7 lb

## 2016-05-09 DIAGNOSIS — D649 Anemia, unspecified: Secondary | ICD-10-CM

## 2016-05-09 DIAGNOSIS — C61 Malignant neoplasm of prostate: Secondary | ICD-10-CM

## 2016-05-09 DIAGNOSIS — R634 Abnormal weight loss: Secondary | ICD-10-CM

## 2016-05-09 DIAGNOSIS — E119 Type 2 diabetes mellitus without complications: Secondary | ICD-10-CM

## 2016-05-09 DIAGNOSIS — R630 Anorexia: Secondary | ICD-10-CM

## 2016-05-09 DIAGNOSIS — C7951 Secondary malignant neoplasm of bone: Secondary | ICD-10-CM

## 2016-05-09 DIAGNOSIS — Q539 Undescended testicle, unspecified: Secondary | ICD-10-CM

## 2016-05-09 DIAGNOSIS — N289 Disorder of kidney and ureter, unspecified: Secondary | ICD-10-CM

## 2016-05-09 LAB — CBC WITH DIFFERENTIAL/PLATELET
BASO%: 0.7 % (ref 0.0–2.0)
Basophils Absolute: 0 10*3/uL (ref 0.0–0.1)
EOS%: 1.2 % (ref 0.0–7.0)
Eosinophils Absolute: 0.1 10*3/uL (ref 0.0–0.5)
HCT: 24.9 % — ABNORMAL LOW (ref 38.4–49.9)
HGB: 7.8 g/dL — ABNORMAL LOW (ref 13.0–17.1)
LYMPH#: 1.4 10*3/uL (ref 0.9–3.3)
LYMPH%: 28.8 % (ref 14.0–49.0)
MCH: 30.2 pg (ref 27.2–33.4)
MCHC: 31.5 g/dL — AB (ref 32.0–36.0)
MCV: 95.9 fL (ref 79.3–98.0)
MONO#: 0.3 10*3/uL (ref 0.1–0.9)
MONO%: 7.2 % (ref 0.0–14.0)
NEUT%: 62.1 % (ref 39.0–75.0)
NEUTROS ABS: 2.9 10*3/uL (ref 1.5–6.5)
PLATELETS: 270 10*3/uL (ref 140–400)
RBC: 2.6 10*6/uL — AB (ref 4.20–5.82)
RDW: 19.4 % — ABNORMAL HIGH (ref 11.0–14.6)
WBC: 4.7 10*3/uL (ref 4.0–10.3)

## 2016-05-09 MED ORDER — ABIRATERONE ACETATE 250 MG PO TABS: 1000.0000 mg | ORAL_TABLET | Freq: Every day | ORAL | 0 refills | Status: DC

## 2016-05-09 MED ORDER — PREDNISONE 5 MG PO TABS: 5.0000 mg | ORAL_TABLET | Freq: Two times a day (BID) | ORAL | 0 refills | Status: DC

## 2016-05-09 MED FILL — predniSONE 5 MG TABS: 5 | 30 days supply | Qty: 60 | Fill #0

## 2016-05-09 MED FILL — ZYTIGA 250 MG TABLET: 250 | 30 days supply | Qty: 120 | Fill #0

## 2016-05-09 NOTE — Telephone Encounter (Signed)
Pt brought [place] [employer] approval letter. He has a co-pay card for Fabio Asa which will make it no cost for pt. Rx e-prescribed to [facility] per Dr. Benay Spice.

## 2016-05-09 NOTE — Progress Notes (Signed)
  [place] OFFICE PROGRESS NOTE   Diagnosis: Prostate cancer, anemia  INTERVAL HISTORY:   Brandon Peters returns as scheduled. He reports improvement in dyspnea.. His energy level is improved. He had one hot flash after receiving Lupron. He completed the course of Casodex. Brandon Peters has been approved for zytiga via the patient assistance program.  Objective:  Vital signs in last 24 hours:  Blood pressure (!) 149/79, pulse (!) 104, temperature 98.1 F (36.7 C), temperature source Oral, resp. rate 18, height 5\' 5"  (1.651 m), weight 149 lb 11.2 oz (67.9 kg), SpO2 100 %.   Resp: Lungs clear bilaterally Cardio: Regular rate and rhythm GI: No hepatosplenomegaly, nontender Vascular: Trace pitting edema at the right greater than left ankle     Lab Results:  Lab Results  Component Value Date   WBC 4.7 05/09/2016   HGB 7.8 (L) 05/09/2016   HCT 24.9 (L) 05/09/2016   MCV 95.9 05/09/2016   PLT 270 05/09/2016   NEUTROABS 2.9 05/09/2016     Medications: I have reviewed the patient's current medications.  1. ASevere anemia-status post a red cell transfusion 03/04/2016  2. Prostate cancer  Extensive sclerotic bone metastases noted on a CT of the abdomen/pelvis 03/04/2016 and MRI abdomen 03/05/2016  Markedly elevated PSA  Lupron initiated 03/15/2016; Casodex 14 days beginning 03/15/2016  Prostate biopsy 03/18/2016  3. Symptoms of obstructive uropathy. Improved since beginning a new medication prescribed by urology.  4. 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 Anorexia/weight loss  6. Undescended testicles  7. Right renal mass.   8. Diabetes Assessment/Plan:  Brandon Peters has an improved performance status since starting anti-androgen  therapy. The hemoglobin has stabilized. Hopefully the hemoglobin improve over the next few months. We discussed recent data supporting the addition of Abiraterone to androgen deprivation therapy. We reviewed the potential toxicities associated with this agent. He agrees to proceed.  Brandon Peters will return for a lab visit in 2 weeks and an office visit/Lupron injection in one month.    [name], MD  05/09/2016  1:02 PM

## 2016-05-10 ENCOUNTER — Telehealth: Payer: Self-pay | Admitting: *Deleted

## 2016-05-10 LAB — PSA: Prostate Specific Ag, Serum: 21.2 ng/mL — ABNORMAL HIGH (ref 0.0–4.0)

## 2016-05-10 NOTE — Telephone Encounter (Signed)
Called pt with PSA result. He voiced appreciation for call. Informed him Dr. Benay Spice received prostate biopsy path report. Shows prostate cancer.

## 2016-05-10 NOTE — Telephone Encounter (Signed)
-----   Message from Ladell Pier, MD sent at 05/10/2016  1:36 PM EDT ----- Please call patient, psa is much better

## 2016-05-12 ENCOUNTER — Telehealth: Payer: Self-pay | Admitting: Oncology

## 2016-05-12 NOTE — Telephone Encounter (Signed)
APPOINTMENT CONFIRMED WITH PATIENT

## 2016-05-24 ENCOUNTER — Other Ambulatory Visit (HOSPITAL_BASED_OUTPATIENT_CLINIC_OR_DEPARTMENT_OTHER): Payer: Self-pay

## 2016-05-24 ENCOUNTER — Other Ambulatory Visit: Payer: Self-pay | Admitting: *Deleted

## 2016-05-24 DIAGNOSIS — C61 Malignant neoplasm of prostate: Secondary | ICD-10-CM

## 2016-05-24 LAB — COMPREHENSIVE METABOLIC PANEL
ALT: 10 U/L (ref 0–55)
AST: 12 U/L (ref 5–34)
Albumin: 3.6 g/dL (ref 3.5–5.0)
Alkaline Phosphatase: 1314 U/L — ABNORMAL HIGH (ref 40–150)
Anion Gap: 10 mEq/L (ref 3–11)
BUN: 19.8 mg/dL (ref 7.0–26.0)
CALCIUM: 9.2 mg/dL (ref 8.4–10.4)
CHLORIDE: 108 meq/L (ref 98–109)
CO2: 23 mEq/L (ref 22–29)
CREATININE: 0.7 mg/dL (ref 0.7–1.3)
EGFR: >90 mL/min/{1.73_m2} (ref >90)
GLUCOSE: 114 mg/dL (ref 70–140)
Potassium: 4 mEq/L (ref 3.5–5.1)
Sodium: 141 mEq/L (ref 136–145)
Total Bilirubin: 0.37 mg/dL (ref 0.20–1.20)
Total Protein: 7 g/dL (ref 6.4–8.3)

## 2016-05-24 LAB — CBC WITH DIFFERENTIAL/PLATELET
BASO%: 1.2 % (ref 0.0–2.0)
Basophils Absolute: 0.1 10*3/uL (ref 0.0–0.1)
EOS%: 0.6 % (ref 0.0–7.0)
Eosinophils Absolute: 0 10*3/uL (ref 0.0–0.5)
HCT: 29.1 % — ABNORMAL LOW (ref 38.4–49.9)
HGB: 9.1 g/dL — ABNORMAL LOW (ref 13.0–17.1)
LYMPH%: 20.3 % (ref 14.0–49.0)
MCH: 30.8 pg (ref 27.2–33.4)
MCHC: 31.2 g/dL — ABNORMAL LOW (ref 32.0–36.0)
MCV: 98.6 fL — ABNORMAL HIGH (ref 79.3–98.0)
MONO#: 0.6 10*3/uL (ref 0.1–0.9)
MONO%: 8.6 % (ref 0.0–14.0)
NEUT%: 69.3 % (ref 39.0–75.0)
NEUTROS ABS: 4.9 10*3/uL (ref 1.5–6.5)
PLATELETS: 321 10*3/uL (ref 140–400)
RBC: 2.96 10*6/uL — AB (ref 4.20–5.82)
RDW: 18.8 % — AB (ref 11.0–14.6)
WBC: 7 10*3/uL (ref 4.0–10.3)
lymph#: 1.4 10*3/uL (ref 0.9–3.3)

## 2016-05-25 LAB — PHOSPHORUS: Phosphorus, Ser: 2.8 mg/dL (ref 2.5–4.5)

## 2016-05-31 ENCOUNTER — Telehealth: Payer: Self-pay | Admitting: *Deleted

## 2016-05-31 NOTE — Telephone Encounter (Signed)
Call from [place] pt assistance foundation. Pt will no longer be able to fill Zytiga Rx at local pharmacy. Foundation will fax form for completion that will need to be sent to their pharmacy. They will ship medication directly to pt. Dr. Gearldine Shown fax # provided.

## 2016-06-01 ENCOUNTER — Encounter: Payer: Self-pay | Admitting: *Deleted

## 2016-06-01 NOTE — Progress Notes (Signed)
Brandon Peters and [employer] patient assistance refill form for [employer] faxed to [phone].

## 2016-06-07 ENCOUNTER — Telehealth: Payer: Self-pay | Admitting: Oncology

## 2016-06-07 ENCOUNTER — Other Ambulatory Visit (HOSPITAL_BASED_OUTPATIENT_CLINIC_OR_DEPARTMENT_OTHER): Payer: Self-pay

## 2016-06-07 ENCOUNTER — Telehealth: Payer: Self-pay | Admitting: *Deleted

## 2016-06-07 ENCOUNTER — Ambulatory Visit (HOSPITAL_BASED_OUTPATIENT_CLINIC_OR_DEPARTMENT_OTHER): Payer: Self-pay

## 2016-06-07 ENCOUNTER — Ambulatory Visit (HOSPITAL_BASED_OUTPATIENT_CLINIC_OR_DEPARTMENT_OTHER): Payer: Self-pay | Admitting: Nurse Practitioner

## 2016-06-07 VITALS — BP 157/88 | HR 91 | Temp 97.7°F | Resp 19 | Wt 145.9 lb

## 2016-06-07 DIAGNOSIS — C61 Malignant neoplasm of prostate: Secondary | ICD-10-CM

## 2016-06-07 DIAGNOSIS — C7951 Secondary malignant neoplasm of bone: Secondary | ICD-10-CM

## 2016-06-07 DIAGNOSIS — E119 Type 2 diabetes mellitus without complications: Secondary | ICD-10-CM

## 2016-06-07 DIAGNOSIS — Q539 Undescended testicle, unspecified: Secondary | ICD-10-CM

## 2016-06-07 DIAGNOSIS — R630 Anorexia: Secondary | ICD-10-CM

## 2016-06-07 DIAGNOSIS — Z5111 Encounter for antineoplastic chemotherapy: Secondary | ICD-10-CM

## 2016-06-07 DIAGNOSIS — D649 Anemia, unspecified: Secondary | ICD-10-CM

## 2016-06-07 DIAGNOSIS — N289 Disorder of kidney and ureter, unspecified: Secondary | ICD-10-CM

## 2016-06-07 DIAGNOSIS — R634 Abnormal weight loss: Secondary | ICD-10-CM

## 2016-06-07 LAB — CBC WITH DIFFERENTIAL/PLATELET
BASO%: 0.4 % (ref 0.0–2.0)
Basophils Absolute: 0 10*3/uL (ref 0.0–0.1)
EOS%: 0.9 % (ref 0.0–7.0)
Eosinophils Absolute: 0.1 10*3/uL (ref 0.0–0.5)
HCT: 30.9 % — ABNORMAL LOW (ref 38.4–49.9)
HEMOGLOBIN: 9.8 g/dL — AB (ref 13.0–17.1)
LYMPH%: 22.2 % (ref 14.0–49.0)
MCH: 31.2 pg (ref 27.2–33.4)
MCHC: 31.7 g/dL — ABNORMAL LOW (ref 32.0–36.0)
MCV: 98.5 fL — ABNORMAL HIGH (ref 79.3–98.0)
MONO#: 0.5 10*3/uL (ref 0.1–0.9)
MONO%: 9 % (ref 0.0–14.0)
NEUT%: 67.5 % (ref 39.0–75.0)
NEUTROS ABS: 3.9 10*3/uL (ref 1.5–6.5)
Platelets: 289 10*3/uL (ref 140–400)
RBC: 3.13 10*6/uL — ABNORMAL LOW (ref 4.20–5.82)
RDW: 17.5 % — AB (ref 11.0–14.6)
WBC: 5.8 10*3/uL (ref 4.0–10.3)
lymph#: 1.3 10*3/uL (ref 0.9–3.3)

## 2016-06-07 LAB — COMPREHENSIVE METABOLIC PANEL
ALBUMIN: 3.5 g/dL (ref 3.5–5.0)
ALT: 14 U/L (ref 0–55)
AST: 12 U/L (ref 5–34)
Anion Gap: 10 mEq/L (ref 3–11)
BUN: 16.8 mg/dL (ref 7.0–26.0)
CO2: 23 mEq/L (ref 22–29)
Calcium: 9.3 mg/dL (ref 8.4–10.4)
Chloride: 108 mEq/L (ref 98–109)
Creatinine: 0.8 mg/dL (ref 0.7–1.3)
EGFR: >90 mL/min/{1.73_m2} (ref >90)
GLUCOSE: 134 mg/dL (ref 70–140)
POTASSIUM: 3.8 meq/L (ref 3.5–5.1)
SODIUM: 141 meq/L (ref 136–145)
Total Bilirubin: 0.36 mg/dL (ref 0.20–1.20)
Total Protein: 6.7 g/dL (ref 6.4–8.3)

## 2016-06-07 MED ORDER — LEUPROLIDE ACETATE (3 MONTH) 22.5 MG IM KIT
22.5000 mg | PACK | Freq: Once | INTRAMUSCULAR | Status: AC
Start: 2016-06-07 — End: 2016-06-07
  Administered 2016-06-07: 22.5 mg via INTRAMUSCULAR
  Filled 2016-06-07: qty 22.5

## 2016-06-07 NOTE — Telephone Encounter (Signed)
Message from pt reporting his Fabio Asa will be shipped on 9/13 from [place]. Calling to make Ned Card, NP aware.

## 2016-06-07 NOTE — Progress Notes (Signed)
  [place] OFFICE PROGRESS NOTE   Diagnosis:  Prostate cancer, anemia  INTERVAL HISTORY:   Brandon Peters returns as scheduled. He continues every three-month Lupron. He began Abiraterone on 05/10/2016. He has noted an increase in hot flashes. No nausea or vomiting. No diarrhea. The rash. No reflux symptoms. No joint pain. He denies shortness of breath.  Objective:  Vital signs in last 24 hours:  Blood pressure (!) 157/88, pulse 91, temperature 97.7 F (36.5 C), temperature source Oral, resp. rate 19, weight 145 lb 14.4 oz (66.2 kg), SpO2 100 %.    HEENT: No thrush or ulcers. Resp: Lungs clear bilaterally. Cardio: Regular rate and rhythm. GI: Abdomen soft and nontender. No organomegaly. Vascular: Trace edema bilateral ankles.    Lab Results:  Lab Results  Component Value Date   WBC 5.8 06/07/2016   HGB 9.8 (L) 06/07/2016   HCT 30.9 (L) 06/07/2016   MCV 98.5 (H) 06/07/2016   PLT 289 06/07/2016   NEUTROABS 3.9 06/07/2016  05/09/2016 PSA 21.2  Imaging:  No results found.  Medications: I have reviewed the patient's current medications.  Assessment/Plan: 1. Severe anemia-status post a red cell transfusion 03/04/2016  2. Prostate cancer  Extensive sclerotic bone metastases noted on a CT of the abdomen/pelvis 03/04/2016 and MRI abdomen 03/05/2016  Markedly elevated PSA  Lupron initiated 03/15/2016; Casodex 14 days beginning 03/15/2016  Prostate biopsy 03/18/2016  05/09/2016 PSA significantly improved at 21  Abiraterone initiated 05/10/2016  3. Symptoms of obstructive uropathy. Improved since beginning a new medication prescribed by urology.  4. 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 Anorexia/weight loss  6. Undescended  testicles  7. Right renal mass.   8. Diabetes   Disposition: Brandon Peters appears stable. There has been significant improvement in the PSA and hemoglobin. Plan to continue Lupron every 3 months and Abiraterone. He will return for labs in 2 weeks and a follow-up visit in one month. He will contact the office in the interim with any problems.    Ned Card ANP/GNP-BC   06/07/2016  10:39 AM

## 2016-06-07 NOTE — Patient Instructions (Signed)
Leuprolide depot injection What is this medicine? LEUPROLIDE (loo PROE lide) is a man-made protein that acts like a natural hormone in the body. It decreases testosterone in men and decreases estrogen in women. In men, this medicine is used to treat advanced prostate cancer. In women, some forms of this medicine may be used to treat endometriosis, uterine fibroids, or other male hormone-related problems. This medicine may be used for other purposes; ask your health care provider or pharmacist if you have questions. What should I tell my health care provider before I take this medicine? They need to know if you have any of these conditions: -diabetes -heart disease or previous heart attack -high blood pressure -high cholesterol -osteoporosis -pain or difficulty passing urine -spinal cord metastasis -stroke -tobacco smoker -unusual vaginal bleeding (women) -an unusual or allergic reaction to leuprolide, benzyl alcohol, other medicines, foods, dyes, or preservatives -pregnant or trying to get pregnant -breast-feeding How should I use this medicine? This medicine is for injection into a muscle or for injection under the skin. It is given by a health care professional in a hospital or clinic setting. The specific product will determine how it will be given to you. Make sure you understand which product you receive and how often you will receive it. Talk to your pediatrician regarding the use of this medicine in children. Special care may be needed. Overdosage: If you think you have taken too much of this medicine contact a poison control center or emergency room at once. NOTE: This medicine is only for you. Do not share this medicine with others. What if I miss a dose? It is important not to miss a dose. Call your doctor or health care professional if you are unable to keep an appointment. Depot injections: Depot injections are given either once-monthly, every 12 weeks, every 16 weeks, or  every 24 weeks depending on the product you are prescribed. The product you are prescribed will be based on if you are male or male, and your condition. Make sure you understand your product and dosing. What may interact with this medicine? Do not take this medicine with any of the following medications: -chasteberry This medicine may also interact with the following medications: -herbal or dietary supplements, like black cohosh or DHEA -male hormones, like estrogens or progestins and birth control pills, patches, rings, or injections -male hormones, like testosterone This list may not describe all possible interactions. Give your health care provider a list of all the medicines, herbs, non-prescription drugs, or dietary supplements you use. Also tell them if you smoke, drink alcohol, or use illegal drugs. Some items may interact with your medicine. What should I watch for while using this medicine? Visit your doctor or health care professional for regular checks on your progress. During the first weeks of treatment, your symptoms may get worse, but then will improve as you continue your treatment. You may get hot flashes, increased bone pain, increased difficulty passing urine, or an aggravation of nerve symptoms. Discuss these effects with your doctor or health care professional, some of them may improve with continued use of this medicine. Male patients may experience a menstrual cycle or spotting during the first months of therapy with this medicine. If this continues, contact your doctor or health care professional. What side effects may I notice from receiving this medicine? Side effects that you should report to your doctor or health care professional as soon as possible: -allergic reactions like skin rash, itching or hives, swelling of the   face, lips, or tongue -breathing problems -chest pain -depression or memory disorders -pain in your legs or groin -pain at site where injected or  implanted -severe headache -swelling of the feet and legs -visual changes -vomiting Side effects that usually do not require medical attention (report to your doctor or health care professional if they continue or are bothersome): -breast swelling or tenderness -decrease in sex drive or performance -diarrhea -hot flashes -loss of appetite -muscle, joint, or bone pains -nausea -redness or irritation at site where injected or implanted -skin problems or acne This list may not describe all possible side effects. Call your doctor for medical advice about side effects. You may report side effects to FDA at 1-800-FDA-1088. Where should I keep my medicine? This drug is given in a hospital or clinic and will not be stored at home. NOTE: This sheet is a summary. It may not cover all possible information. If you have questions about this medicine, talk to your doctor, pharmacist, or health care provider.    2016, Elsevier/Gold Standard. (2014-06-06 14:16:23)  

## 2016-06-07 NOTE — Telephone Encounter (Signed)
Gave patient avs report and appointments for September and October  °

## 2016-06-08 LAB — PHOSPHORUS: Phosphorus, Ser: 3 mg/dL (ref 2.5–4.5)

## 2016-06-08 LAB — PSA: Prostate Specific Ag, Serum: 8.4 ng/mL — ABNORMAL HIGH (ref 0.0–4.0)

## 2016-06-13 ENCOUNTER — Telehealth: Payer: Self-pay

## 2016-06-13 MED ORDER — PREDNISONE 5 MG PO TABS: 5.0000 mg | ORAL_TABLET | Freq: Two times a day (BID) | ORAL | 0 refills | Status: DC

## 2016-06-13 MED FILL — predniSONE 5 MG TABS: 5 | 30 days supply | Qty: 60 | Fill #0

## 2016-06-13 NOTE — Telephone Encounter (Signed)
Zytiga Rx needs to be sent to [facility] specialty pharmacy. They have filled it once but the current rx has no refills. This is new specialty pharmacy for the pt that he had to change d/t insurancy.

## 2016-06-14 ENCOUNTER — Other Ambulatory Visit: Payer: Self-pay | Admitting: *Deleted

## 2016-06-14 MED ORDER — ABIRATERONE ACETATE 250 MG PO TABS: 1000.0000 mg | ORAL_TABLET | Freq: Every day | ORAL | 0 refills | Status: DC

## 2016-06-14 NOTE — Telephone Encounter (Signed)
Refill for Zytiga sent to [facility] specialty pharmacy per patient request.

## 2016-06-21 ENCOUNTER — Other Ambulatory Visit (HOSPITAL_BASED_OUTPATIENT_CLINIC_OR_DEPARTMENT_OTHER): Payer: Self-pay

## 2016-06-21 DIAGNOSIS — C61 Malignant neoplasm of prostate: Secondary | ICD-10-CM

## 2016-06-21 LAB — COMPREHENSIVE METABOLIC PANEL
ALT: 13 U/L (ref 0–55)
ANION GAP: 11 meq/L (ref 3–11)
AST: 11 U/L (ref 5–34)
Albumin: 3.5 g/dL (ref 3.5–5.0)
Alkaline Phosphatase: 748 U/L — ABNORMAL HIGH (ref 40–150)
BILIRUBIN TOTAL: 0.3 mg/dL (ref 0.20–1.20)
BUN: 16.2 mg/dL (ref 7.0–26.0)
CALCIUM: 9.2 mg/dL (ref 8.4–10.4)
CHLORIDE: 109 meq/L (ref 98–109)
CO2: 22 mEq/L (ref 22–29)
CREATININE: 0.7 mg/dL (ref 0.7–1.3)
Glucose: 120 mg/dl (ref 70–140)
Potassium: 3.9 mEq/L (ref 3.5–5.1)
Sodium: 143 mEq/L (ref 136–145)
Total Protein: 6.7 g/dL (ref 6.4–8.3)

## 2016-06-22 LAB — PHOSPHORUS: Phosphorus, Ser: 3.2 mg/dL (ref 2.5–4.5)

## 2016-06-25 ENCOUNTER — Other Ambulatory Visit: Payer: Self-pay | Admitting: Internal Medicine

## 2016-06-27 ENCOUNTER — Encounter: Payer: Self-pay | Admitting: Oncology

## 2016-06-27 NOTE — Progress Notes (Signed)
Left physician form for doctor so sign for patient assistance for Lupron. Left note for patient to sign on 07/05/16, then will fax to [employer]. Per Tiffany, turn around response time is 2-3 days and if approved, 1st injection will be automatically sent. Each additional refill request will need to be called in by patient or someone authorized in office and can status can be checked by calling in 2 days.

## 2016-06-28 ENCOUNTER — Encounter: Payer: Self-pay | Admitting: Oncology

## 2016-06-28 NOTE — Progress Notes (Signed)
Received signed physician form for patient assistance through [employer]. Will hold application pending patient signature. Then fax to company.

## 2016-06-30 ENCOUNTER — Other Ambulatory Visit: Payer: Self-pay | Admitting: *Deleted

## 2016-06-30 MED ORDER — ABIRATERONE ACETATE 250 MG PO TABS: 1000.0000 mg | ORAL_TABLET | Freq: Every day | ORAL | 0 refills | Status: DC

## 2016-07-05 ENCOUNTER — Ambulatory Visit (HOSPITAL_BASED_OUTPATIENT_CLINIC_OR_DEPARTMENT_OTHER): Payer: Self-pay

## 2016-07-05 ENCOUNTER — Ambulatory Visit (HOSPITAL_BASED_OUTPATIENT_CLINIC_OR_DEPARTMENT_OTHER): Payer: Self-pay | Admitting: Oncology

## 2016-07-05 ENCOUNTER — Telehealth: Payer: Self-pay | Admitting: *Deleted

## 2016-07-05 ENCOUNTER — Telehealth: Payer: Self-pay | Admitting: Oncology

## 2016-07-05 VITALS — BP 149/82 | HR 78 | Temp 97.7°F | Resp 17 | Ht 65.0 in | Wt 145.2 lb

## 2016-07-05 DIAGNOSIS — Q539 Undescended testicle, unspecified: Secondary | ICD-10-CM

## 2016-07-05 DIAGNOSIS — C61 Malignant neoplasm of prostate: Secondary | ICD-10-CM

## 2016-07-05 DIAGNOSIS — D649 Anemia, unspecified: Secondary | ICD-10-CM

## 2016-07-05 DIAGNOSIS — R630 Anorexia: Secondary | ICD-10-CM

## 2016-07-05 DIAGNOSIS — C7951 Secondary malignant neoplasm of bone: Secondary | ICD-10-CM

## 2016-07-05 DIAGNOSIS — N289 Disorder of kidney and ureter, unspecified: Secondary | ICD-10-CM

## 2016-07-05 DIAGNOSIS — R634 Abnormal weight loss: Secondary | ICD-10-CM

## 2016-07-05 DIAGNOSIS — E119 Type 2 diabetes mellitus without complications: Secondary | ICD-10-CM

## 2016-07-05 LAB — COMPREHENSIVE METABOLIC PANEL
ALBUMIN: 3.8 g/dL (ref 3.5–5.0)
ALK PHOS: 600 U/L — AB (ref 40–150)
ALT: 10 U/L (ref 0–55)
AST: 11 U/L (ref 5–34)
Anion Gap: 11 mEq/L (ref 3–11)
BILIRUBIN TOTAL: 0.32 mg/dL (ref 0.20–1.20)
BUN: 17.9 mg/dL (ref 7.0–26.0)
CO2: 23 mEq/L (ref 22–29)
Calcium: 9.4 mg/dL (ref 8.4–10.4)
Chloride: 108 mEq/L (ref 98–109)
Creatinine: 0.8 mg/dL (ref 0.7–1.3)
EGFR: >90 mL/min/{1.73_m2} (ref >90)
GLUCOSE: 124 mg/dL (ref 70–140)
Potassium: 4 mEq/L (ref 3.5–5.1)
SODIUM: 142 meq/L (ref 136–145)
TOTAL PROTEIN: 7.2 g/dL (ref 6.4–8.3)

## 2016-07-05 LAB — CBC WITH DIFFERENTIAL/PLATELET
BASO%: 0.5 % (ref 0.0–2.0)
Basophils Absolute: 0 10*3/uL (ref 0.0–0.1)
EOS ABS: 0 10*3/uL (ref 0.0–0.5)
EOS%: 0.5 % (ref 0.0–7.0)
HCT: 35.6 % — ABNORMAL LOW (ref 38.4–49.9)
HEMOGLOBIN: 11.4 g/dL — AB (ref 13.0–17.1)
LYMPH%: 15.3 % (ref 14.0–49.0)
MCH: 31.1 pg (ref 27.2–33.4)
MCHC: 31.9 g/dL — ABNORMAL LOW (ref 32.0–36.0)
MCV: 97.5 fL (ref 79.3–98.0)
MONO#: 0.4 10*3/uL (ref 0.1–0.9)
MONO%: 6.3 % (ref 0.0–14.0)
NEUT%: 77.4 % — ABNORMAL HIGH (ref 39.0–75.0)
NEUTROS ABS: 5.3 10*3/uL (ref 1.5–6.5)
Platelets: 320 10*3/uL (ref 140–400)
RBC: 3.66 10*6/uL — ABNORMAL LOW (ref 4.20–5.82)
RDW: 15.3 % — AB (ref 11.0–14.6)
WBC: 6.9 10*3/uL (ref 4.0–10.3)
lymph#: 1.1 10*3/uL (ref 0.9–3.3)

## 2016-07-05 NOTE — Telephone Encounter (Signed)
Message sent to chemo scheduler to add chemo. Avs report and appointment schedule given to patient, per 07/05/16 los. °

## 2016-07-05 NOTE — Telephone Encounter (Signed)
Per LOS and staff message I have scheduled appts and notified the scheduler

## 2016-07-05 NOTE — Telephone Encounter (Signed)
Notified Shauna, Estate manager/land agent, that pt needs assistance for Zometa. She will contact pt to complete application.

## 2016-07-05 NOTE — Progress Notes (Signed)
  [place] OFFICE PROGRESS NOTE   Diagnosis:  Prostate cancer  INTERVAL HISTORY:    Mr.  Bobick returns as scheduled. He continues zytiga ad prednisone. He reports feelingwell. No difficulty with uinaton.   He last received Lupron on 06/07/2016.  Objective:  Vital signs in last 24 hours:  Blood pressure (!) 149/82, pulse 78, temperature 97.7 F (36.5 C), temperature source Oral, resp. rate 17, height 5\' 5"  (1.651 m), weight 145 lb 3.2 oz (65.9 kg), SpO2 100 %.    HEENT:  Neck  witout mass Resp:  Lungs clear bilaterally Cardio:  Regular rate and rhythm GI:  No   hepatospleomegaly , nontender Vascular: no leg  Edema, trace ankle and foot edema on the right greater than left   Lab Results:  Lab Results  Component Value Date   WBC 6.9 07/05/2016   HGB 11.4 (L) 07/05/2016   HCT 35.6 (L) 07/05/2016   MCV 97.5 07/05/2016   PLT 320 07/05/2016   NEUTROABS 5.3 07/05/2016     Medications: I have reviewed the patient's current medications.  Assessment/Plan: 1. Severe anemia-status post a red cell transfusion 03/04/2016  2. Prostate cancer  Extensive sclerotic bone metastases noted on a CT of the abdomen/pelvis 03/04/2016 and MRI abdomen 03/05/2016  Markedly elevated PSA  Lupron initiated 03/15/2016; Casodex 14 days beginning 03/15/2016  Prostate biopsy 03/18/2016  05/09/2016 PSA significantly improved at 21  Abiraterone initiated 05/10/2016  3. Symptoms of obstructive uropathy. Improved since beginning a new medication prescribed by urology.  4. 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 Anorexia/weight loss-Improved  6. Undescended testicles  7. Right renal mass.   8. Diabetes   Disposition:  Mr. Hinnenkamp appears well.  The PSA and hemoglobin continued to improve. He is tolerating treatment well. The plan is to continue abiraterone/prednisone. He continues every three-month Lupron.  I discussed the benefit of biphosphonate therapy with Mr. Kahler. He agrees to Zometa therapy. We reviewed the potential toxicities associated with Zometa including the chance of osteo-necrosis.  He will return for an office visit and Zometa in one month.  [name], MD  07/05/2016  1:53 PM

## 2016-07-06 ENCOUNTER — Encounter: Payer: Self-pay | Admitting: Oncology

## 2016-07-06 ENCOUNTER — Ambulatory Visit: Payer: Self-pay

## 2016-07-06 LAB — PSA: PROSTATE SPECIFIC AG, SERUM: 3.1 ng/mL (ref 0.0–4.0)

## 2016-07-06 LAB — PHOSPHORUS: PHOSPHORUS: 3.7 mg/dL (ref 2.5–4.5)

## 2016-07-06 NOTE — Progress Notes (Signed)
Patient came in for our appointment. Obtained signature on [employer] patient assistance application(Lupron) and [employer](Zometa). Advised patient for [employer] application bank statements for the last 3 months would be needed. Patient advised he can get them to me by Friday. No financial verification was required for Lupron. Faxed to [employer] for Lupron. Fax received ok per confirmation sheet.

## 2016-07-08 ENCOUNTER — Encounter: Payer: Self-pay | Admitting: Oncology

## 2016-07-08 NOTE — Progress Notes (Signed)
Faxed enrollment application and supporting documents for Zometa to [employer]. Fax received ok per confirmation sheet.

## 2016-07-12 ENCOUNTER — Other Ambulatory Visit: Payer: Self-pay | Admitting: Oncology

## 2016-07-12 MED FILL — predniSONE 5 MG TABS: 5 | 30 days supply | Qty: 60 | Fill #0

## 2016-07-13 ENCOUNTER — Encounter: Payer: Self-pay | Admitting: Oncology

## 2016-07-13 NOTE — Progress Notes (Signed)
Received approval letter from [facility] for Lupron. Patient approved 07/07/16-07/07/17. Email sent to drug replacement communication team. Copy placed to be scanned by HIM.    1st injection will be sent automatically.    Refills to be called approximately 2 weeks prior to requiring furhter medication at [phone].

## 2016-07-13 NOTE — Progress Notes (Signed)
Received approval letter form [employer] for patient to receive Zometa at no cost 07/11/2016-07/11/2017.Copy placed to be scanned by HIM. Email sent to drug replacement communication team.    1st shipment will be automatically shipped. Subsequent refills should be requested on or after the next fill date by contacting [facility] at [phone].

## 2016-07-19 ENCOUNTER — Telehealth: Payer: Self-pay | Admitting: *Deleted

## 2016-07-19 NOTE — Telephone Encounter (Signed)
Call from [facility] with [facility] requesting clarification on Zometa concentration needed. Per [facility] Pharmacist: Zometa 4mg / 17mL. Order read back.

## 2016-08-01 ENCOUNTER — Other Ambulatory Visit: Payer: Self-pay | Admitting: *Deleted

## 2016-08-01 MED ORDER — ABIRATERONE ACETATE 250 MG PO TABS: 1000.0000 mg | ORAL_TABLET | Freq: Every day | ORAL | 0 refills | Status: DC

## 2016-08-02 ENCOUNTER — Other Ambulatory Visit (HOSPITAL_BASED_OUTPATIENT_CLINIC_OR_DEPARTMENT_OTHER): Payer: Self-pay

## 2016-08-02 ENCOUNTER — Ambulatory Visit (HOSPITAL_BASED_OUTPATIENT_CLINIC_OR_DEPARTMENT_OTHER): Payer: Self-pay | Admitting: Nurse Practitioner

## 2016-08-02 ENCOUNTER — Ambulatory Visit (HOSPITAL_BASED_OUTPATIENT_CLINIC_OR_DEPARTMENT_OTHER): Payer: Self-pay

## 2016-08-02 ENCOUNTER — Telehealth: Payer: Self-pay | Admitting: Oncology

## 2016-08-02 VITALS — BP 161/83 | HR 96 | Temp 98.0°F | Resp 18 | Ht 65.0 in | Wt 148.0 lb

## 2016-08-02 DIAGNOSIS — N289 Disorder of kidney and ureter, unspecified: Secondary | ICD-10-CM

## 2016-08-02 DIAGNOSIS — E119 Type 2 diabetes mellitus without complications: Secondary | ICD-10-CM

## 2016-08-02 DIAGNOSIS — C7951 Secondary malignant neoplasm of bone: Secondary | ICD-10-CM

## 2016-08-02 DIAGNOSIS — Z23 Encounter for immunization: Secondary | ICD-10-CM

## 2016-08-02 DIAGNOSIS — C61 Malignant neoplasm of prostate: Secondary | ICD-10-CM

## 2016-08-02 DIAGNOSIS — Q539 Undescended testicle, unspecified: Secondary | ICD-10-CM

## 2016-08-02 LAB — COMPREHENSIVE METABOLIC PANEL
ALT: 14 U/L (ref 0–55)
AST: 12 U/L (ref 5–34)
Albumin: 3.5 g/dL (ref 3.5–5.0)
Alkaline Phosphatase: 263 U/L — ABNORMAL HIGH (ref 40–150)
Anion Gap: 9 mEq/L (ref 3–11)
BUN: 20.8 mg/dL (ref 7.0–26.0)
CHLORIDE: 107 meq/L (ref 98–109)
CO2: 26 meq/L (ref 22–29)
CREATININE: 0.8 mg/dL (ref 0.7–1.3)
Calcium: 9.5 mg/dL (ref 8.4–10.4)
EGFR: >90 mL/min/{1.73_m2} (ref >90)
GLUCOSE: 147 mg/dL — AB (ref 70–140)
Potassium: 4.2 mEq/L (ref 3.5–5.1)
SODIUM: 142 meq/L (ref 136–145)
Total Bilirubin: 0.29 mg/dL (ref 0.20–1.20)
Total Protein: 6.8 g/dL (ref 6.4–8.3)

## 2016-08-02 LAB — CBC WITH DIFFERENTIAL/PLATELET
BASO%: 0.2 % (ref 0.0–2.0)
Basophils Absolute: 0 10*3/uL (ref 0.0–0.1)
EOS%: 0.5 % (ref 0.0–7.0)
Eosinophils Absolute: 0 10*3/uL (ref 0.0–0.5)
HEMATOCRIT: 35.6 % — AB (ref 38.4–49.9)
HGB: 11.2 g/dL — ABNORMAL LOW (ref 13.0–17.1)
LYMPH#: 1.3 10*3/uL (ref 0.9–3.3)
LYMPH%: 19.5 % (ref 14.0–49.0)
MCH: 30.7 pg (ref 27.2–33.4)
MCHC: 31.5 g/dL — AB (ref 32.0–36.0)
MCV: 97.5 fL (ref 79.3–98.0)
MONO#: 0.4 10*3/uL (ref 0.1–0.9)
MONO%: 6.5 % (ref 0.0–14.0)
NEUT#: 4.7 10*3/uL (ref 1.5–6.5)
NEUT%: 73.3 % (ref 39.0–75.0)
Platelets: 250 10*3/uL (ref 140–400)
RBC: 3.65 10*6/uL — AB (ref 4.20–5.82)
RDW: 13.6 % (ref 11.0–14.6)
WBC: 6.4 10*3/uL (ref 4.0–10.3)

## 2016-08-02 MED ORDER — ZOLEDRONIC ACID 4 MG/5ML IV CONC
4.0000 mg | INTRAVENOUS | Status: DC
  Administered 2016-08-02: 4 mg via INTRAVENOUS
  Filled 2016-08-02: qty 5

## 2016-08-02 MED ORDER — INFLUENZA VAC SPLIT QUAD 0.5 ML IM SUSY
0.5000 mL | PREFILLED_SYRINGE | Freq: Once | INTRAMUSCULAR | Status: AC
  Administered 2016-08-02: 0.5 mL via INTRAMUSCULAR
  Filled 2016-08-02: qty 0.5

## 2016-08-02 MED ORDER — ZOLEDRONIC ACID 4 MG/100ML IV SOLN
4.0000 mg | INTRAVENOUS | Status: DC
  Filled 2016-08-02: qty 100

## 2016-08-02 NOTE — Telephone Encounter (Signed)
Appointments scheduled per 11/07/017 los. Injection scheduled prior to M.D. Visit, per Audie Clear. All appointments scheduled per 08/02/16 los. AVS report and appointment schedule given to patient, per 08/02/16 los.

## 2016-08-02 NOTE — Progress Notes (Signed)
  [place] OFFICE PROGRESS NOTE   Diagnosis:  Prostate cancer  INTERVAL HISTORY:   Brandon Peters returns as scheduled. He continues zytiga and prednisone. He receives Lupron every 3 months with the most recent injection given 06/07/2016. He denies nausea/vomiting. No mouth sores. No diarrhea. He denies pain. Specifically no jaw pain. No urinary symptoms. No reflux symptoms. Only complaint is hot flashes, mainly during the night.  Objective:  Vital signs in last 24 hours:  Blood pressure (!) 161/83, pulse 96, temperature 98 F (36.7 C), temperature source Oral, resp. rate 18, height 5\' 5"  (1.651 m), weight 148 lb (67.1 kg), SpO2 100 %.    HEENT: No thrush or ulcers. Resp: Lungs clear bilaterally. Cardio: Regular rate and rhythm. GI: Abdomen soft and nontender. No organomegaly. Vascular: Trace lower leg/ankle edema bilaterally right greater than left.   Lab Results:  Lab Results  Component Value Date   WBC 6.4 08/02/2016   HGB 11.2 (L) 08/02/2016   HCT 35.6 (L) 08/02/2016   MCV 97.5 08/02/2016   PLT 250 08/02/2016   NEUTROABS 4.7 08/02/2016  07/05/2016 PSA 3.1  Imaging:  No results found.  Medications: I have reviewed the patient's current medications.  Assessment/Plan: 1. Severe anemia-status post a red cell transfusion 03/04/2016  2. Prostate cancer  Extensive sclerotic bone metastases noted on a CT of the abdomen/pelvis 03/04/2016 and MRI abdomen 03/05/2016  Markedly elevated PSA  Lupron initiated 03/15/2016; Casodex 14 days beginning 03/15/2016  Prostate biopsy 03/18/2016  05/09/2016 PSA significantly improved at 21  Abirateroneinitiated08/15/2017  3. Symptoms of obstructive uropathy. Improved since beginning a new medication prescribed by urology.  4. Distal duodenal and descending colon masses  biopsy of the duodenal mass 03/04/2016 confirmed fragments of small bowel mucosa with focal high-grade dysplasia  Biopsy of the  descending colon "polyps" revealed fragments of a tubulovillous adenoma  5. Anorexia/weight loss-Improved  6. Undescended testicles  7. Right renal mass.   8. Diabetes   Disposition: Brandon Peters appears well. The PSA was further improved in October and the hemoglobin remains stable. Plan to continue abiraterone/prednisone and every three-month Lupron. He is scheduled to receive Zometa today. We reviewed potential toxicities including the chance of osteonecrosis. He is agreeable to proceed.  He will return for a follow-up visit and Lupron injection in one month. He will contact the office in the interim with any problems.    Ned Card ANP/GNP-BC   08/02/2016  12:25 PM

## 2016-08-02 NOTE — Patient Instructions (Signed)

## 2016-08-03 LAB — PSA: Prostate Specific Ag, Serum: 1.4 ng/mL (ref 0.0–4.0)

## 2016-08-03 LAB — PHOSPHORUS: Phosphorus, Ser: 3.4 mg/dL (ref 2.5–4.5)

## 2016-08-10 ENCOUNTER — Other Ambulatory Visit: Payer: Self-pay | Admitting: Oncology

## 2016-08-11 MED FILL — predniSONE 5 MG TABS: 5 | 30 days supply | Qty: 60 | Fill #0

## 2016-08-30 ENCOUNTER — Other Ambulatory Visit (HOSPITAL_BASED_OUTPATIENT_CLINIC_OR_DEPARTMENT_OTHER): Payer: Self-pay

## 2016-08-30 ENCOUNTER — Other Ambulatory Visit: Payer: Self-pay | Admitting: *Deleted

## 2016-08-30 ENCOUNTER — Telehealth: Payer: Self-pay | Admitting: Oncology

## 2016-08-30 ENCOUNTER — Ambulatory Visit (HOSPITAL_BASED_OUTPATIENT_CLINIC_OR_DEPARTMENT_OTHER): Payer: Self-pay

## 2016-08-30 ENCOUNTER — Ambulatory Visit (HOSPITAL_BASED_OUTPATIENT_CLINIC_OR_DEPARTMENT_OTHER): Payer: Self-pay | Admitting: Oncology

## 2016-08-30 VITALS — BP 174/87 | HR 84 | Temp 98.4°F | Resp 18 | Ht 65.0 in | Wt 149.2 lb

## 2016-08-30 DIAGNOSIS — C61 Malignant neoplasm of prostate: Secondary | ICD-10-CM

## 2016-08-30 DIAGNOSIS — N289 Disorder of kidney and ureter, unspecified: Secondary | ICD-10-CM

## 2016-08-30 DIAGNOSIS — C7951 Secondary malignant neoplasm of bone: Secondary | ICD-10-CM

## 2016-08-30 DIAGNOSIS — Z5111 Encounter for antineoplastic chemotherapy: Secondary | ICD-10-CM

## 2016-08-30 DIAGNOSIS — Q539 Undescended testicle, unspecified: Secondary | ICD-10-CM

## 2016-08-30 DIAGNOSIS — E119 Type 2 diabetes mellitus without complications: Secondary | ICD-10-CM

## 2016-08-30 LAB — COMPREHENSIVE METABOLIC PANEL
ALBUMIN: 3.4 g/dL — AB (ref 3.5–5.0)
ALK PHOS: 139 U/L (ref 40–150)
ALT: 11 U/L (ref 0–55)
AST: 11 U/L (ref 5–34)
Anion Gap: 11 mEq/L (ref 3–11)
BUN: 18.1 mg/dL (ref 7.0–26.0)
CHLORIDE: 107 meq/L (ref 98–109)
CO2: 25 mEq/L (ref 22–29)
Calcium: 9.1 mg/dL (ref 8.4–10.4)
Creatinine: 0.8 mg/dL (ref 0.7–1.3)
GLUCOSE: 139 mg/dL (ref 70–140)
POTASSIUM: 3.7 meq/L (ref 3.5–5.1)
SODIUM: 142 meq/L (ref 136–145)
Total Bilirubin: 0.35 mg/dL (ref 0.20–1.20)
Total Protein: 6.8 g/dL (ref 6.4–8.3)

## 2016-08-30 LAB — CBC WITH DIFFERENTIAL/PLATELET
BASO%: 1.1 % (ref 0.0–2.0)
BASOS ABS: 0.1 10*3/uL (ref 0.0–0.1)
EOS ABS: 0.1 10*3/uL (ref 0.0–0.5)
EOS%: 1.7 % (ref 0.0–7.0)
HCT: 33.9 % — ABNORMAL LOW (ref 38.4–49.9)
HGB: 11 g/dL — ABNORMAL LOW (ref 13.0–17.1)
LYMPH%: 35.7 % (ref 14.0–49.0)
MCH: 30.8 pg (ref 27.2–33.4)
MCHC: 32.4 g/dL (ref 32.0–36.0)
MCV: 95.1 fL (ref 79.3–98.0)
MONO#: 0.5 10*3/uL (ref 0.1–0.9)
MONO%: 7.3 % (ref 0.0–14.0)
NEUT#: 3.6 10*3/uL (ref 1.5–6.5)
NEUT%: 54.2 % (ref 39.0–75.0)
Platelets: 306 10*3/uL (ref 140–400)
RBC: 3.56 10*6/uL — AB (ref 4.20–5.82)
RDW: 14.3 % (ref 11.0–14.6)
WBC: 6.6 10*3/uL (ref 4.0–10.3)
lymph#: 2.3 10*3/uL (ref 0.9–3.3)

## 2016-08-30 MED ORDER — ABIRATERONE ACETATE 250 MG PO TABS: 1000.0000 mg | ORAL_TABLET | Freq: Every day | ORAL | 0 refills | Status: DC

## 2016-08-30 MED ORDER — LEUPROLIDE ACETATE (3 MONTH) 22.5 MG IM KIT
22.5000 mg | PACK | Freq: Once | INTRAMUSCULAR | Status: AC
  Administered 2016-08-30: 22.5 mg via INTRAMUSCULAR
  Filled 2016-08-30: qty 22.5

## 2016-08-30 NOTE — Progress Notes (Signed)
  [place] OFFICE PROGRESS NOTE   Diagnosis: Prostate cancer  INTERVAL HISTORY:   Mr. Felber returns as scheduled. He continues Lupron and abiraterone. He received Zometa and a flu vaccine on 08/02/2016. He reports developing a fever and aching for 6 hours the following day. No difficulty with urination. He has hot flashes at night. Good appetite.  Objective:  Vital signs in last 24 hours:  Blood pressure (!) 174/87, pulse 84, temperature 98.4 F (36.9 C), temperature source Oral, resp. rate 18, height 5\' 5"  (1.651 m), weight 149 lb 3.2 oz (67.7 kg), SpO2 99 %.   Resp: Lungs clear bilaterally Cardio: Regular rate and rhythm GI: No hepatomegaly, nontender Vascular: No leg edema Skin: Apparent nail-fungus at the right second finger     Lab Results:  Lab Results  Component Value Date   WBC 6.6 08/30/2016   HGB 11.0 (L) 08/30/2016   HCT 33.9 (L) 08/30/2016   MCV 95.1 08/30/2016   PLT 306 08/30/2016   NEUTROABS 3.6 08/30/2016   08/02/2016-PSA  1.4  Medications: I have reviewed the patient's current medications.  Assessment/Plan: 1. Severe anemia-status post a red cell transfusion 03/04/2016  2. Prostate cancer  Extensive sclerotic bone metastases noted on a CT of the abdomen/pelvis 03/04/2016 and MRI abdomen 03/05/2016  Markedly elevated PSA  Lupron initiated 03/15/2016; Casodex 14 days beginning 03/15/2016  Prostate biopsy 03/18/2016  05/09/2016 PSA significantly improved at 21  Abirateroneinitiated08/15/2017  3. Symptoms of obstructive uropathy. Improved since beginning a new medication prescribed by urology.  4. 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 Anorexia/weight loss-Improved  6. Undescended testicles  7.  Right renal mass.   8. Diabetes   Disposition:  Mr. Slagowski appears well. He will continue every three-month Lupron and daily zytiga. The PSA and anemia have improved.  The flu like symptoms may have been related to Zometa or the influenza vaccine. He will return for an office visit and Zometa in 2 months.  [name], MD  08/30/2016  9:55 AM

## 2016-08-30 NOTE — Telephone Encounter (Signed)
Gave patient avs report and appointments for February.  °

## 2016-08-30 NOTE — Patient Instructions (Signed)
Leuprolide depot injection What is this medicine? LEUPROLIDE (loo PROE lide) is a man-made protein that acts like a natural hormone in the body. It decreases testosterone in men and decreases estrogen in women. In men, this medicine is used to treat advanced prostate cancer. In women, some forms of this medicine may be used to treat endometriosis, uterine fibroids, or other male hormone-related problems. This medicine may be used for other purposes; ask your health care provider or pharmacist if you have questions. What should I tell my health care provider before I take this medicine? They need to know if you have any of these conditions: -diabetes -heart disease or previous heart attack -high blood pressure -high cholesterol -osteoporosis -pain or difficulty passing urine -spinal cord metastasis -stroke -tobacco smoker -unusual vaginal bleeding (women) -an unusual or allergic reaction to leuprolide, benzyl alcohol, other medicines, foods, dyes, or preservatives -pregnant or trying to get pregnant -breast-feeding How should I use this medicine? This medicine is for injection into a muscle or for injection under the skin. It is given by a health care professional in a hospital or clinic setting. The specific product will determine how it will be given to you. Make sure you understand which product you receive and how often you will receive it. Talk to your pediatrician regarding the use of this medicine in children. Special care may be needed. Overdosage: If you think you have taken too much of this medicine contact a poison control center or emergency room at once. NOTE: This medicine is only for you. Do not share this medicine with others. What if I miss a dose? It is important not to miss a dose. Call your doctor or health care professional if you are unable to keep an appointment. Depot injections: Depot injections are given either once-monthly, every 12 weeks, every 16 weeks, or  every 24 weeks depending on the product you are prescribed. The product you are prescribed will be based on if you are male or male, and your condition. Make sure you understand your product and dosing. What may interact with this medicine? Do not take this medicine with any of the following medications: -chasteberry This medicine may also interact with the following medications: -herbal or dietary supplements, like black cohosh or DHEA -male hormones, like estrogens or progestins and birth control pills, patches, rings, or injections -male hormones, like testosterone This list may not describe all possible interactions. Give your health care provider a list of all the medicines, herbs, non-prescription drugs, or dietary supplements you use. Also tell them if you smoke, drink alcohol, or use illegal drugs. Some items may interact with your medicine. What should I watch for while using this medicine? Visit your doctor or health care professional for regular checks on your progress. During the first weeks of treatment, your symptoms may get worse, but then will improve as you continue your treatment. You may get hot flashes, increased bone pain, increased difficulty passing urine, or an aggravation of nerve symptoms. Discuss these effects with your doctor or health care professional, some of them may improve with continued use of this medicine. Male patients may experience a menstrual cycle or spotting during the first months of therapy with this medicine. If this continues, contact your doctor or health care professional. What side effects may I notice from receiving this medicine? Side effects that you should report to your doctor or health care professional as soon as possible: -allergic reactions like skin rash, itching or hives, swelling of the   face, lips, or tongue -breathing problems -chest pain -depression or memory disorders -pain in your legs or groin -pain at site where injected or  implanted -severe headache -swelling of the feet and legs -visual changes -vomiting Side effects that usually do not require medical attention (report to your doctor or health care professional if they continue or are bothersome): -breast swelling or tenderness -decrease in sex drive or performance -diarrhea -hot flashes -loss of appetite -muscle, joint, or bone pains -nausea -redness or irritation at site where injected or implanted -skin problems or acne This list may not describe all possible side effects. Call your doctor for medical advice about side effects. You may report side effects to FDA at 1-800-FDA-1088. Where should I keep my medicine? This drug is given in a hospital or clinic and will not be stored at home. NOTE: This sheet is a summary. It may not cover all possible information. If you have questions about this medicine, talk to your doctor, pharmacist, or health care provider.    2016, Elsevier/Gold Standard. (2014-06-06 14:16:23)  

## 2016-08-31 LAB — PSA: Prostate Specific Ag, Serum: 0.8 ng/mL (ref 0.0–4.0)

## 2016-08-31 LAB — PHOSPHORUS: Phosphorus, Ser: 3.4 mg/dL (ref 2.5–4.5)

## 2016-09-07 MED FILL — predniSONE 5 MG TABS: 5 | 30 days supply | Qty: 60 | Fill #1

## 2016-09-28 ENCOUNTER — Other Ambulatory Visit: Payer: Self-pay | Admitting: *Deleted

## 2016-09-28 MED ORDER — ABIRATERONE ACETATE 250 MG PO TABS: 1000.0000 mg | ORAL_TABLET | Freq: Every day | ORAL | 0 refills | Status: DC

## 2016-10-06 ENCOUNTER — Other Ambulatory Visit: Payer: Self-pay | Admitting: Oncology

## 2016-10-06 MED FILL — predniSONE 5 MG TABS: 5 | 30 days supply | Qty: 60 | Fill #0

## 2016-10-26 ENCOUNTER — Other Ambulatory Visit: Payer: Self-pay | Admitting: *Deleted

## 2016-10-26 MED ORDER — ABIRATERONE ACETATE 250 MG PO TABS: 1000.0000 mg | ORAL_TABLET | Freq: Every day | ORAL | 0 refills | Status: DC

## 2016-11-01 ENCOUNTER — Ambulatory Visit (HOSPITAL_BASED_OUTPATIENT_CLINIC_OR_DEPARTMENT_OTHER): Payer: Self-pay

## 2016-11-01 ENCOUNTER — Other Ambulatory Visit (HOSPITAL_BASED_OUTPATIENT_CLINIC_OR_DEPARTMENT_OTHER): Payer: Self-pay

## 2016-11-01 ENCOUNTER — Telehealth: Payer: Self-pay | Admitting: Nurse Practitioner

## 2016-11-01 ENCOUNTER — Telehealth: Payer: Self-pay

## 2016-11-01 ENCOUNTER — Telehealth: Payer: Self-pay | Admitting: *Deleted

## 2016-11-01 ENCOUNTER — Ambulatory Visit (HOSPITAL_BASED_OUTPATIENT_CLINIC_OR_DEPARTMENT_OTHER): Payer: Self-pay | Admitting: Nurse Practitioner

## 2016-11-01 VITALS — BP 162/84

## 2016-11-01 VITALS — BP 161/84 | HR 98 | Temp 98.2°F | Resp 18 | Ht 65.0 in | Wt 152.2 lb

## 2016-11-01 DIAGNOSIS — C7951 Secondary malignant neoplasm of bone: Secondary | ICD-10-CM

## 2016-11-01 DIAGNOSIS — Q539 Undescended testicle, unspecified: Secondary | ICD-10-CM

## 2016-11-01 DIAGNOSIS — C61 Malignant neoplasm of prostate: Secondary | ICD-10-CM

## 2016-11-01 DIAGNOSIS — N289 Disorder of kidney and ureter, unspecified: Secondary | ICD-10-CM

## 2016-11-01 DIAGNOSIS — E119 Type 2 diabetes mellitus without complications: Secondary | ICD-10-CM

## 2016-11-01 LAB — COMPREHENSIVE METABOLIC PANEL
ALT: 13 U/L (ref 0–55)
ANION GAP: 10 meq/L (ref 3–11)
AST: 12 U/L (ref 5–34)
Albumin: 3.7 g/dL (ref 3.5–5.0)
Alkaline Phosphatase: 73 U/L (ref 40–150)
BUN: 18.4 mg/dL (ref 7.0–26.0)
CHLORIDE: 106 meq/L (ref 98–109)
CO2: 26 meq/L (ref 22–29)
Calcium: 9.9 mg/dL (ref 8.4–10.4)
Creatinine: 0.8 mg/dL (ref 0.7–1.3)
Glucose: 146 mg/dl — ABNORMAL HIGH (ref 70–140)
Potassium: 3.6 mEq/L (ref 3.5–5.1)
SODIUM: 141 meq/L (ref 136–145)
Total Bilirubin: 0.38 mg/dL (ref 0.20–1.20)
Total Protein: 6.7 g/dL (ref 6.4–8.3)

## 2016-11-01 LAB — CBC WITH DIFFERENTIAL/PLATELET
BASO%: 0.1 % (ref 0.0–2.0)
Basophils Absolute: 0 10*3/uL (ref 0.0–0.1)
EOS ABS: 0.1 10*3/uL (ref 0.0–0.5)
EOS%: 0.7 % (ref 0.0–7.0)
HCT: 37.8 % — ABNORMAL LOW (ref 38.4–49.9)
HGB: 11.8 g/dL — ABNORMAL LOW (ref 13.0–17.1)
LYMPH%: 24.6 % (ref 14.0–49.0)
MCH: 30.5 pg (ref 27.2–33.4)
MCHC: 31.2 g/dL — AB (ref 32.0–36.0)
MCV: 97.7 fL (ref 79.3–98.0)
MONO#: 0.4 10*3/uL (ref 0.1–0.9)
MONO%: 5.5 % (ref 0.0–14.0)
NEUT#: 4.9 10*3/uL (ref 1.5–6.5)
NEUT%: 69.1 % (ref 39.0–75.0)
PLATELETS: 246 10*3/uL (ref 140–400)
RBC: 3.87 10*6/uL — AB (ref 4.20–5.82)
RDW: 13.5 % (ref 11.0–14.6)
WBC: 7.1 10*3/uL (ref 4.0–10.3)
lymph#: 1.7 10*3/uL (ref 0.9–3.3)

## 2016-11-01 MED ORDER — SODIUM CHLORIDE 0.9 % IV SOLN
4.0000 mg | INTRAVENOUS | Status: DC
  Administered 2016-11-01: 4 mg via INTRAVENOUS
  Filled 2016-11-01: qty 5

## 2016-11-01 NOTE — Telephone Encounter (Signed)
Per 2/6 LOS I have scheduled appt and notified the scheduler

## 2016-11-01 NOTE — Patient Instructions (Signed)

## 2016-11-01 NOTE — Progress Notes (Signed)
  [place] OFFICE PROGRESS NOTE   Diagnosis:  Prostate cancer  INTERVAL HISTORY:   Brandon Peters returns as scheduled. He continues Lupron and abiraterone. He feels well. No nausea or vomiting. No constipation or diarrhea. He denies pain. He describes his appetite as "great". He continues to have hot flashes.  Objective:  Vital signs in last 24 hours:  Blood pressure (!) 161/84, pulse 98, temperature 98.2 F (36.8 C), temperature source Oral, resp. rate 18, height 5\' 5"  (1.651 m), weight 152 lb 3.2 oz (69 kg), SpO2 98 %.   Lymphatics: No palpable cervical or supraclavicular lymph nodes. Resp: Lungs clear bilaterally. Cardio: Regular rate and rhythm. GI: Abdomen soft and nontender. No organomegaly. Vascular: No leg edema.   Lab Results:  Lab Results  Component Value Date   WBC 7.1 11/01/2016   HGB 11.8 (L) 11/01/2016   HCT 37.8 (L) 11/01/2016   MCV 97.7 11/01/2016   PLT 246 11/01/2016   NEUTROABS 4.9 11/01/2016    Imaging:  No results found.  Medications: I have reviewed the patient's current medications.  Assessment/Plan: 1. Severe anemia-status post a red cell transfusion 03/04/2016  2. Prostate cancer  Extensive sclerotic bone metastases noted on a CT of the abdomen/pelvis 03/04/2016 and MRI abdomen 03/05/2016  Markedly elevated PSA  Lupron initiated 03/15/2016; Casodex 14 days beginning 03/15/2016  Prostate biopsy 03/18/2016  05/09/2016 PSA significantly improved at 21  Abirateroneinitiated08/15/2017  3. Symptoms of obstructive uropathy. Improved since beginning a new medication prescribed by urology.  4. 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 Anorexia/weight loss-Improved  6. Undescended testicles  7.  Right renal mass.   8. Diabetes   Disposition:Brandon Peters appears well. Hemoglobin and PSA continue to be improved. Plan to continue every 3 month Lupron and daily abiraterone. He receives Zometa every 3 months as well.  He will return for a follow-up visit in 2 months. He will contact the office in the interim with any problems.    Ned Card ANP/GNP-BC   11/01/2016  12:12 PM

## 2016-11-01 NOTE — Telephone Encounter (Signed)
Appointments scheduled per 2/6 LOS. Patient given AVS report and calendars with future scheduled appointments. °

## 2016-11-01 NOTE — Telephone Encounter (Signed)
Zometa 4 mg vial reordered thru [employer].  Should arrive 11/03/16 per representative.  RX# [id number] [phone]   Thank you Henreitta Leber, PharmD

## 2016-11-02 LAB — TRIGLYCERIDES: TRIGLYCERIDES: 78 mg/dL (ref 0–149)

## 2016-11-02 LAB — PSA: Prostate Specific Ag, Serum: 0.4 ng/mL (ref 0.0–4.0)

## 2016-11-02 LAB — PHOSPHORUS: Phosphorus, Ser: 3.7 mg/dL (ref 2.5–4.5)

## 2016-11-10 MED FILL — predniSONE 5 MG TABS: 5 | 30 days supply | Qty: 60 | Fill #1

## 2016-11-22 ENCOUNTER — Ambulatory Visit (HOSPITAL_BASED_OUTPATIENT_CLINIC_OR_DEPARTMENT_OTHER): Payer: Self-pay

## 2016-11-22 VITALS — BP 175/89 | HR 100 | Temp 97.5°F | Resp 18

## 2016-11-22 DIAGNOSIS — C7951 Secondary malignant neoplasm of bone: Secondary | ICD-10-CM

## 2016-11-22 DIAGNOSIS — C61 Malignant neoplasm of prostate: Secondary | ICD-10-CM

## 2016-11-22 DIAGNOSIS — Z5111 Encounter for antineoplastic chemotherapy: Secondary | ICD-10-CM

## 2016-11-22 MED ORDER — LEUPROLIDE ACETATE (3 MONTH) 22.5 MG IM KIT
22.5000 mg | PACK | Freq: Once | INTRAMUSCULAR | Status: AC
  Administered 2016-11-22: 22.5 mg via INTRAMUSCULAR
  Filled 2016-11-22: qty 22.5

## 2016-11-22 NOTE — Patient Instructions (Signed)
Leuprolide depot injection What is this medicine? LEUPROLIDE (loo PROE lide) is a man-made protein that acts like a natural hormone in the body. It decreases testosterone in men and decreases estrogen in women. In men, this medicine is used to treat advanced prostate cancer. In women, some forms of this medicine may be used to treat endometriosis, uterine fibroids, or other male hormone-related problems. This medicine may be used for other purposes; ask your health care provider or pharmacist if you have questions. What should I tell my health care provider before I take this medicine? They need to know if you have any of these conditions: -diabetes -heart disease or previous heart attack -high blood pressure -high cholesterol -osteoporosis -pain or difficulty passing urine -spinal cord metastasis -stroke -tobacco smoker -unusual vaginal bleeding (women) -an unusual or allergic reaction to leuprolide, benzyl alcohol, other medicines, foods, dyes, or preservatives -pregnant or trying to get pregnant -breast-feeding How should I use this medicine? This medicine is for injection into a muscle or for injection under the skin. It is given by a health care professional in a hospital or clinic setting. The specific product will determine how it will be given to you. Make sure you understand which product you receive and how often you will receive it. Talk to your pediatrician regarding the use of this medicine in children. Special care may be needed. Overdosage: If you think you have taken too much of this medicine contact a poison control center or emergency room at once. NOTE: This medicine is only for you. Do not share this medicine with others. What if I miss a dose? It is important not to miss a dose. Call your doctor or health care professional if you are unable to keep an appointment. Depot injections: Depot injections are given either once-monthly, every 12 weeks, every 16 weeks, or  every 24 weeks depending on the product you are prescribed. The product you are prescribed will be based on if you are male or male, and your condition. Make sure you understand your product and dosing. What may interact with this medicine? Do not take this medicine with any of the following medications: -chasteberry This medicine may also interact with the following medications: -herbal or dietary supplements, like black cohosh or DHEA -male hormones, like estrogens or progestins and birth control pills, patches, rings, or injections -male hormones, like testosterone This list may not describe all possible interactions. Give your health care provider a list of all the medicines, herbs, non-prescription drugs, or dietary supplements you use. Also tell them if you smoke, drink alcohol, or use illegal drugs. Some items may interact with your medicine. What should I watch for while using this medicine? Visit your doctor or health care professional for regular checks on your progress. During the first weeks of treatment, your symptoms may get worse, but then will improve as you continue your treatment. You may get hot flashes, increased bone pain, increased difficulty passing urine, or an aggravation of nerve symptoms. Discuss these effects with your doctor or health care professional, some of them may improve with continued use of this medicine. Male patients may experience a menstrual cycle or spotting during the first months of therapy with this medicine. If this continues, contact your doctor or health care professional. What side effects may I notice from receiving this medicine? Side effects that you should report to your doctor or health care professional as soon as possible: -allergic reactions like skin rash, itching or hives, swelling of the   face, lips, or tongue -breathing problems -chest pain -depression or memory disorders -pain in your legs or groin -pain at site where injected or  implanted -severe headache -swelling of the feet and legs -visual changes -vomiting Side effects that usually do not require medical attention (report to your doctor or health care professional if they continue or are bothersome): -breast swelling or tenderness -decrease in sex drive or performance -diarrhea -hot flashes -loss of appetite -muscle, joint, or bone pains -nausea -redness or irritation at site where injected or implanted -skin problems or acne This list may not describe all possible side effects. Call your doctor for medical advice about side effects. You may report side effects to FDA at 1-800-FDA-1088. Where should I keep my medicine? This drug is given in a hospital or clinic and will not be stored at home. NOTE: This sheet is a summary. It may not cover all possible information. If you have questions about this medicine, talk to your doctor, pharmacist, or health care provider.    2016, Elsevier/Gold Standard. (2014-06-06 14:16:23)  

## 2016-11-23 ENCOUNTER — Other Ambulatory Visit: Payer: Self-pay | Admitting: *Deleted

## 2016-11-23 MED ORDER — ABIRATERONE ACETATE 250 MG PO TABS: 1000.0000 mg | ORAL_TABLET | Freq: Every day | ORAL | 2 refills | Status: DC

## 2016-11-25 ENCOUNTER — Encounter: Payer: Self-pay | Admitting: Pharmacy Technician

## 2016-11-25 NOTE — Progress Notes (Unsigned)
11/25/16 Spoke Santiago Glad at [facility] patient assist. Ordered Lupron 22.5 mg  For service date 02/20/17.  Informed by rep. Application expires [id number]. Expect to receive within 2-3 business days.   Thank you  Theotis Burrow, CPhT, [facility]  Drug Replacement Specialist

## 2016-11-29 ENCOUNTER — Encounter: Payer: Self-pay | Admitting: Pharmacy Technician

## 2016-11-29 NOTE — Progress Notes (Signed)
Rec'd one Lupron 22.5 mg syringe on 11/29/16.

## 2016-12-07 ENCOUNTER — Other Ambulatory Visit: Payer: Self-pay | Admitting: Oncology

## 2016-12-09 MED FILL — predniSONE 5 MG TABS: 5 | 30 days supply | Qty: 60 | Fill #0

## 2016-12-27 ENCOUNTER — Ambulatory Visit (HOSPITAL_BASED_OUTPATIENT_CLINIC_OR_DEPARTMENT_OTHER): Payer: Self-pay | Admitting: Oncology

## 2016-12-27 ENCOUNTER — Telehealth: Payer: Self-pay | Admitting: Oncology

## 2016-12-27 ENCOUNTER — Other Ambulatory Visit (HOSPITAL_BASED_OUTPATIENT_CLINIC_OR_DEPARTMENT_OTHER): Payer: Self-pay

## 2016-12-27 ENCOUNTER — Ambulatory Visit: Payer: Self-pay

## 2016-12-27 VITALS — BP 166/79 | HR 98 | Temp 98.0°F | Resp 18 | Ht 65.0 in | Wt 155.7 lb

## 2016-12-27 DIAGNOSIS — C61 Malignant neoplasm of prostate: Secondary | ICD-10-CM

## 2016-12-27 DIAGNOSIS — Q539 Undescended testicle, unspecified: Secondary | ICD-10-CM

## 2016-12-27 DIAGNOSIS — N289 Disorder of kidney and ureter, unspecified: Secondary | ICD-10-CM

## 2016-12-27 DIAGNOSIS — E119 Type 2 diabetes mellitus without complications: Secondary | ICD-10-CM

## 2016-12-27 DIAGNOSIS — C7951 Secondary malignant neoplasm of bone: Secondary | ICD-10-CM

## 2016-12-27 LAB — COMPREHENSIVE METABOLIC PANEL
ALT: 15 U/L (ref 0–55)
AST: 11 U/L (ref 5–34)
Albumin: 3.5 g/dL (ref 3.5–5.0)
Alkaline Phosphatase: 72 U/L (ref 40–150)
Anion Gap: 11 mEq/L (ref 3–11)
BILIRUBIN TOTAL: 0.3 mg/dL (ref 0.20–1.20)
BUN: 20.6 mg/dL (ref 7.0–26.0)
CHLORIDE: 109 meq/L (ref 98–109)
CO2: 23 meq/L (ref 22–29)
Calcium: 9.4 mg/dL (ref 8.4–10.4)
Creatinine: 0.7 mg/dL (ref 0.7–1.3)
GLUCOSE: 129 mg/dL (ref 70–140)
POTASSIUM: 3.5 meq/L (ref 3.5–5.1)
SODIUM: 144 meq/L (ref 136–145)
TOTAL PROTEIN: 6.6 g/dL (ref 6.4–8.3)

## 2016-12-27 LAB — CBC WITH DIFFERENTIAL/PLATELET
BASO%: 0.7 % (ref 0.0–2.0)
Basophils Absolute: 0.1 10*3/uL (ref 0.0–0.1)
EOS%: 1 % (ref 0.0–7.0)
Eosinophils Absolute: 0.1 10*3/uL (ref 0.0–0.5)
HCT: 35.5 % — ABNORMAL LOW (ref 38.4–49.9)
HGB: 11.7 g/dL — ABNORMAL LOW (ref 13.0–17.1)
LYMPH%: 20.7 % (ref 14.0–49.0)
MCH: 30.6 pg (ref 27.2–33.4)
MCHC: 33 g/dL (ref 32.0–36.0)
MCV: 92.8 fL (ref 79.3–98.0)
MONO#: 0.7 10*3/uL (ref 0.1–0.9)
MONO%: 8.5 % (ref 0.0–14.0)
NEUT%: 69.1 % (ref 39.0–75.0)
NEUTROS ABS: 5.8 10*3/uL (ref 1.5–6.5)
Platelets: 268 10*3/uL (ref 140–400)
RBC: 3.83 10*6/uL — AB (ref 4.20–5.82)
RDW: 13.9 % (ref 11.0–14.6)
WBC: 8.4 10*3/uL (ref 4.0–10.3)
lymph#: 1.7 10*3/uL (ref 0.9–3.3)

## 2016-12-27 NOTE — Telephone Encounter (Signed)
Gave patient AVS and calender per 12/27/2016 los.  

## 2016-12-27 NOTE — Progress Notes (Signed)
  [place] OFFICE PROGRESS NOTE   Diagnosis: Prostate cancer  INTERVAL HISTORY:   [name] returns as scheduled. He continues Lupron and abiraterone. He is maintained on every 3 month Zometa. He feels well. His only complaint is intermittent hot flashes.  Objective:  Vital signs in last 24 hours:  Blood pressure (!) 166/79, pulse 98, temperature 98 F (36.7 C), temperature source Oral, resp. rate 18, height 5\' 5"  (1.651 m), weight 155 lb 11.2 oz (70.6 kg), SpO2 99 %.    HEENT: Neck without mass Lymphatics: No cervical or supraclavicular nodes Resp: Lungs clear bilaterally Cardio: Regular rate and rhythm GI: No hepatosplenomegaly, nontender Vascular: No leg edema   Lab Results:  Lab Results  Component Value Date   WBC 8.4 12/27/2016   HGB 11.7 (L) 12/27/2016   HCT 35.5 (L) 12/27/2016   MCV 92.8 12/27/2016   PLT 268 12/27/2016   NEUTROABS 5.8 12/27/2016     Medications: I have reviewed the patient's current medications.  Assessment/Plan: 1. Severe anemia-status post a red cell transfusion 03/04/2016 2.. Prostate cancer  Extensive sclerotic bone metastases noted on a CT of the abdomen/pelvis 03/04/2016 and MRI abdomen 03/05/2016  Markedly elevated PSA  Lupron initiated 03/15/2016; Casodex 14 days beginning 03/15/2016  Prostate biopsy 03/18/2016  05/09/2016 PSA significantly improved at 21  Abirateroneinitiated08/15/2017  3. Symptoms of obstructive uropathy. Improved since beginning a new medication prescribed by urology.  4. 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 Anorexia/weight loss-Improved  6. Undescended testicles  7. Right renal mass.   8. Diabetes    Disposition:  [name] continues to tolerate  the Lupron abiraterone well. We will follow-up on the PSA from today. He will return for an office visit, Lupron, and Zometa on 02/21/2017.  He will be 1 year out from the prostate cancer diagnosis when we see him in May. We will consider the indication for re-evaluating the renal mass and small bowel/colon masses. He has no symptoms to suggest progression of a gastrointestinal malignancy.   [name], MD  12/27/2016  1:00 PM

## 2016-12-28 LAB — PSA: PROSTATE SPECIFIC AG, SERUM: 0.2 ng/mL (ref 0.0–4.0)

## 2016-12-28 LAB — PHOSPHORUS: Phosphorus, Ser: 3.4 mg/dL (ref 2.5–4.5)

## 2016-12-28 LAB — TRIGLYCERIDES: TRIGLYCERIDES: 137 mg/dL (ref 0–149)

## 2017-01-11 MED FILL — predniSONE 5 MG TABS: 5 | 30 days supply | Qty: 60 | Fill #1

## 2017-02-07 ENCOUNTER — Other Ambulatory Visit: Payer: Self-pay | Admitting: Oncology

## 2017-02-08 MED FILL — predniSONE 5 MG TABS: 5 | 30 days supply | Qty: 60 | Fill #0

## 2017-02-21 ENCOUNTER — Telehealth: Payer: Self-pay | Admitting: *Deleted

## 2017-02-21 ENCOUNTER — Other Ambulatory Visit (HOSPITAL_BASED_OUTPATIENT_CLINIC_OR_DEPARTMENT_OTHER): Payer: Self-pay

## 2017-02-21 ENCOUNTER — Telehealth: Payer: Self-pay | Admitting: Oncology

## 2017-02-21 ENCOUNTER — Ambulatory Visit (HOSPITAL_BASED_OUTPATIENT_CLINIC_OR_DEPARTMENT_OTHER): Payer: Self-pay | Admitting: Nurse Practitioner

## 2017-02-21 ENCOUNTER — Ambulatory Visit (HOSPITAL_BASED_OUTPATIENT_CLINIC_OR_DEPARTMENT_OTHER): Payer: Self-pay

## 2017-02-21 VITALS — BP 180/85 | HR 104 | Temp 97.8°F | Resp 18 | Ht 65.0 in | Wt 160.8 lb

## 2017-02-21 VITALS — BP 169/84 | HR 87

## 2017-02-21 DIAGNOSIS — C7951 Secondary malignant neoplasm of bone: Secondary | ICD-10-CM

## 2017-02-21 DIAGNOSIS — Z5111 Encounter for antineoplastic chemotherapy: Secondary | ICD-10-CM

## 2017-02-21 DIAGNOSIS — Q539 Undescended testicle, unspecified: Secondary | ICD-10-CM

## 2017-02-21 DIAGNOSIS — C61 Malignant neoplasm of prostate: Secondary | ICD-10-CM

## 2017-02-21 DIAGNOSIS — E119 Type 2 diabetes mellitus without complications: Secondary | ICD-10-CM

## 2017-02-21 DIAGNOSIS — N289 Disorder of kidney and ureter, unspecified: Secondary | ICD-10-CM

## 2017-02-21 LAB — CBC WITH DIFFERENTIAL/PLATELET
BASO%: 0.5 % (ref 0.0–2.0)
Basophils Absolute: 0 10*3/uL (ref 0.0–0.1)
EOS ABS: 0 10*3/uL (ref 0.0–0.5)
EOS%: 0.4 % (ref 0.0–7.0)
HCT: 38.7 % (ref 38.4–49.9)
HGB: 12.7 g/dL — ABNORMAL LOW (ref 13.0–17.1)
LYMPH%: 15.6 % (ref 14.0–49.0)
MCH: 30.5 pg (ref 27.2–33.4)
MCHC: 32.9 g/dL (ref 32.0–36.0)
MCV: 92.8 fL (ref 79.3–98.0)
MONO#: 0.6 10*3/uL (ref 0.1–0.9)
MONO%: 6.3 % (ref 0.0–14.0)
NEUT%: 77.2 % — ABNORMAL HIGH (ref 39.0–75.0)
NEUTROS ABS: 6.8 10*3/uL — AB (ref 1.5–6.5)
Platelets: 287 10*3/uL (ref 140–400)
RBC: 4.17 10*6/uL — AB (ref 4.20–5.82)
RDW: 14 % (ref 11.0–14.6)
WBC: 8.9 10*3/uL (ref 4.0–10.3)
lymph#: 1.4 10*3/uL (ref 0.9–3.3)

## 2017-02-21 LAB — COMPREHENSIVE METABOLIC PANEL
ALT: 14 U/L (ref 0–55)
AST: 11 U/L (ref 5–34)
Albumin: 3.8 g/dL (ref 3.5–5.0)
Alkaline Phosphatase: 75 U/L (ref 40–150)
Anion Gap: 12 mEq/L — ABNORMAL HIGH (ref 3–11)
BUN: 21 mg/dL (ref 7.0–26.0)
CHLORIDE: 106 meq/L (ref 98–109)
CO2: 25 mEq/L (ref 22–29)
Calcium: 9.9 mg/dL (ref 8.4–10.4)
Creatinine: 0.8 mg/dL (ref 0.7–1.3)
GLUCOSE: 140 mg/dL (ref 70–140)
POTASSIUM: 3.6 meq/L (ref 3.5–5.1)
Sodium: 143 mEq/L (ref 136–145)
TOTAL PROTEIN: 7.2 g/dL (ref 6.4–8.3)
Total Bilirubin: 0.43 mg/dL (ref 0.20–1.20)

## 2017-02-21 MED ORDER — SODIUM CHLORIDE 0.9 % IV SOLN
Freq: Once | INTRAVENOUS | Status: AC
  Administered 2017-02-21: 13:00:00 via INTRAVENOUS

## 2017-02-21 MED ORDER — ZOLEDRONIC ACID 4 MG/5ML IV CONC
4.0000 mg | INTRAVENOUS | Status: DC
  Administered 2017-02-21: 4 mg via INTRAVENOUS
  Filled 2017-02-21: qty 5

## 2017-02-21 MED ORDER — LEUPROLIDE ACETATE (3 MONTH) 22.5 MG IM KIT
22.5000 mg | PACK | Freq: Once | INTRAMUSCULAR | Status: AC
  Administered 2017-02-21: 22.5 mg via INTRAMUSCULAR
  Filled 2017-02-21: qty 22.5

## 2017-02-21 NOTE — Progress Notes (Signed)
  [place] OFFICE PROGRESS NOTE   Diagnosis:  Prostate cancer  INTERVAL HISTORY:   Brandon Peters returns as scheduled. He continues Lupron and abiraterone. He is maintained on every 3 month Zometa. He feels well. He has a good appetite and good energy level. He denies pain. No nausea or vomiting. No diarrhea or constipation. He continues to have hot flashes.  Objective:  Vital signs in last 24 hours:  Blood pressure (!) 180/85, pulse (!) 104, temperature 97.8 F (36.6 C), temperature source Oral, resp. rate 18, height 5\' 5"  (1.651 m), weight 160 lb 12.8 oz (72.9 kg), SpO2 97 %.    HEENT: No thrush or ulcers. Lymphatics: No cervical or supra clavicular lymph nodes. Resp: Lungs clear bilaterally. Cardio: Regular rate and rhythm. GI: Abdomen soft and nontender. No hepatomegaly. Vascular: Trace lower leg edema bilaterally.   Lab Results:  Lab Results  Component Value Date   WBC 8.9 02/21/2017   HGB 12.7 (L) 02/21/2017   HCT 38.7 02/21/2017   MCV 92.8 02/21/2017   PLT 287 02/21/2017   NEUTROABS 6.8 (H) 02/21/2017   12/27/2016 PSA 0.2 Imaging:  No results found.  Medications: I have reviewed the patient's current medications.  Assessment/Plan: 1. Severe anemia-status post a red cell transfusion 03/04/2016 2.. Prostate cancer  Extensive sclerotic bone metastases noted on a CT of the abdomen/pelvis 03/04/2016 and MRI abdomen 03/05/2016  Markedly elevated PSA  Lupron initiated 03/15/2016; Casodex 14 days beginning 03/15/2016  Prostate biopsy 03/18/2016  05/09/2016 PSA significantly improved at 21  Abirateroneinitiated08/15/2017  3. Symptoms of obstructive uropathy. Improved since beginning a new medication prescribed by urology.  4. 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 Anorexia/weight loss-Improved  6. Undescended testicles  7. Right renal mass.   8. Diabetes    Disposition: Brandon Peters appears well. Most recent PSA was further improved in the normal range. He will continue Lupron and abiraterone. We will follow-up on the PSA from today.  He receives Zometa every 3 months and is due for a dose today.  Dr. Benay Spice recommends Foundation 1 testing on the prostate biopsy given the prostate cancer and small bowel/colon masses.  He will return for labs and a follow-up visit in 6 weeks. He will contact the office in the interim with any problems.  Plan reviewed with Dr. Benay Spice.    Ned Card ANP/GNP-BC   02/21/2017  12:24 PM

## 2017-02-21 NOTE — Patient Instructions (Signed)
Zoledronic Acid injection (Hypercalcemia, Oncology) What is this medicine? ZOLEDRONIC ACID (ZOE le dron ik AS id) lowers the amount of calcium loss from bone. It is used to treat too much calcium in your blood from cancer. It is also used to prevent complications of cancer that has spread to the bone. This medicine may be used for other purposes; ask your health care provider or pharmacist if you have questions. COMMON BRAND NAME(S): Zometa What should I tell my health care provider before I take this medicine? They need to know if you have any of these conditions: -aspirin-sensitive asthma -cancer, especially if you are receiving medicines used to treat cancer -dental disease or wear dentures -infection -kidney disease -receiving corticosteroids like dexamethasone or prednisone -an unusual or allergic reaction to zoledronic acid, other medicines, foods, dyes, or preservatives -pregnant or trying to get pregnant -breast-feeding How should I use this medicine? This medicine is for infusion into a vein. It is given by a health care professional in a hospital or clinic setting. Talk to your pediatrician regarding the use of this medicine in children. Special care may be needed. Overdosage: If you think you have taken too much of this medicine contact a poison control center or emergency room at once. NOTE: This medicine is only for you. Do not share this medicine with others. What if I miss a dose? It is important not to miss your dose. Call your doctor or health care professional if you are unable to keep an appointment. What may interact with this medicine? -certain antibiotics given by injection -NSAIDs, medicines for pain and inflammation, like ibuprofen or naproxen -some diuretics like bumetanide, furosemide -teriparatide -thalidomide This list may not describe all possible interactions. Give your health care provider a list of all the medicines, herbs, non-prescription drugs, or  dietary supplements you use. Also tell them if you smoke, drink alcohol, or use illegal drugs. Some items may interact with your medicine. What should I watch for while using this medicine? Visit your doctor or health care professional for regular checkups. It may be some time before you see the benefit from this medicine. Do not stop taking your medicine unless your doctor tells you to. Your doctor may order blood tests or other tests to see how you are doing. Women should inform their doctor if they wish to become pregnant or think they might be pregnant. There is a potential for serious side effects to an unborn child. Talk to your health care professional or pharmacist for more information. You should make sure that you get enough calcium and vitamin D while you are taking this medicine. Discuss the foods you eat and the vitamins you take with your health care professional. Some people who take this medicine have severe bone, joint, and/or muscle pain. This medicine may also increase your risk for jaw problems or a broken thigh bone. Tell your doctor right away if you have severe pain in your jaw, bones, joints, or muscles. Tell your doctor if you have any pain that does not go away or that gets worse. Tell your dentist and dental surgeon that you are taking this medicine. You should not have major dental surgery while on this medicine. See your dentist to have a dental exam and fix any dental problems before starting this medicine. Take good care of your teeth while on this medicine. Make sure you see your dentist for regular follow-up appointments. What side effects may I notice from receiving this medicine? Side effects that   you should report to your doctor or health care professional as soon as possible: -allergic reactions like skin rash, itching or hives, swelling of the face, lips, or tongue -anxiety, confusion, or depression -breathing problems -changes in vision -eye pain -feeling faint or  lightheaded, falls -jaw pain, especially after dental work -mouth sores -muscle cramps, stiffness, or weakness -redness, blistering, peeling or loosening of the skin, including inside the mouth -trouble passing urine or change in the amount of urine Side effects that usually do not require medical attention (report to your doctor or health care professional if they continue or are bothersome): -bone, joint, or muscle pain -constipation -diarrhea -fever -hair loss -irritation at site where injected -loss of appetite -nausea, vomiting -stomach upset -trouble sleeping -trouble swallowing -weak or tired This list may not describe all possible side effects. Call your doctor for medical advice about side effects. You may report side effects to FDA at 1-800-FDA-1088. Where should I keep my medicine? This drug is given in a hospital or clinic and will not be stored at home. NOTE: This sheet is a summary. It may not cover all possible information. If you have questions about this medicine, talk to your doctor, pharmacist, or health care provider.  2018 Elsevier/Gold Standard (2014-02-08 14:19:39)  Leuprolide injection What is this medicine? LEUPROLIDE (loo PROE lide) is a man-made hormone. It is used to treat the symptoms of prostate cancer. This medicine may also be used to treat children with early onset of puberty. It may be used for other hormonal conditions. This medicine may be used for other purposes; ask your health care provider or pharmacist if you have questions. COMMON BRAND NAME(S): Lupron What should I tell my health care provider before I take this medicine? They need to know if you have any of these conditions: -diabetes -heart disease or previous heart attack -high blood pressure -high cholesterol -pain or difficulty passing urine -spinal cord metastasis -stroke -tobacco smoker -an unusual or allergic reaction to leuprolide, benzyl alcohol, other medicines, foods,  dyes, or preservatives -pregnant or trying to get pregnant -breast-feeding How should I use this medicine? This medicine is for injection under the skin or into a muscle. You will be taught how to prepare and give this medicine. Use exactly as directed. Take your medicine at regular intervals. Do not take your medicine more often than directed. It is important that you put your used needles and syringes in a special sharps container. Do not put them in a trash can. If you do not have a sharps container, call your pharmacist or healthcare provider to get one. A special MedGuide will be given to you by the pharmacist with each prescription and refill. Be sure to read this information carefully each time. Talk to your pediatrician regarding the use of this medicine in children. While this medicine may be prescribed for children as young as 8 years for selected conditions, precautions do apply. Overdosage: If you think you have taken too much of this medicine contact a poison control center or emergency room at once. NOTE: This medicine is only for you. Do not share this medicine with others. What if I miss a dose? If you miss a dose, take it as soon as you can. If it is almost time for your next dose, take only that dose. Do not take double or extra doses. What may interact with this medicine? Do not take this medicine with any of the following medications: -chasteberry This medicine may also interact with   the following medications: -herbal or dietary supplements, like black cohosh or DHEA -male hormones, like estrogens or progestins and birth control pills, patches, rings, or injections -male hormones, like testosterone This list may not describe all possible interactions. Give your health care provider a list of all the medicines, herbs, non-prescription drugs, or dietary supplements you use. Also tell them if you smoke, drink alcohol, or use illegal drugs. Some items may interact with your  medicine. What should I watch for while using this medicine? Visit your doctor or health care professional for regular checks on your progress. During the first week, your symptoms may get worse, but then will improve as you continue your treatment. You may get hot flashes, increased bone pain, increased difficulty passing urine, or an aggravation of nerve symptoms. Discuss these effects with your doctor or health care professional, some of them may improve with continued use of this medicine. Male patients may experience a menstrual cycle or spotting during the first 2 months of therapy with this medicine. If this continues, contact your doctor or health care professional. What side effects may I notice from receiving this medicine? Side effects that you should report to your doctor or health care professional as soon as possible: -allergic reactions like skin rash, itching or hives, swelling of the face, lips, or tongue -breathing problems -chest pain -depression or memory disorders -pain in your legs or groin -pain at site where injected -severe headache -swelling of the feet and legs -visual changes -vomiting Side effects that usually do not require medical attention (report to your doctor or health care professional if they continue or are bothersome): -breast swelling or tenderness -decrease in sex drive or performance -diarrhea -hot flashes -loss of appetite -muscle, joint, or bone pains -nausea -redness or irritation at site where injected -skin problems or acne This list may not describe all possible side effects. Call your doctor for medical advice about side effects. You may report side effects to FDA at 1-800-FDA-1088. Where should I keep my medicine? Keep out of the reach of children. Store below 25 degrees C (77 degrees F). Do not freeze. Protect from light. Do not use if it is not clear or if there are particles present. Throw away any unused medicine after the  expiration date. NOTE: This sheet is a summary. It may not cover all possible information. If you have questions about this medicine, talk to your doctor, pharmacist, or health care provider.  2018 Elsevier/Gold Standard (2016-05-02 10:54:35)  

## 2017-02-21 NOTE — Telephone Encounter (Signed)
[facility] called to request Foundation1 testing to be completed on Prostate Biopsy 03/18/16.

## 2017-02-21 NOTE — Telephone Encounter (Signed)
Gave patient AVS and calender per 5/29 LOS  

## 2017-02-22 LAB — PSA: PROSTATE SPECIFIC AG, SERUM: 0.1 ng/mL (ref 0.0–4.0)

## 2017-03-02 ENCOUNTER — Other Ambulatory Visit: Payer: Self-pay | Admitting: Internal Medicine

## 2017-03-13 MED FILL — predniSONE 5 MG TABS: 5 | 30 days supply | Qty: 60 | Fill #1

## 2017-03-17 ENCOUNTER — Encounter (HOSPITAL_COMMUNITY): Payer: Self-pay

## 2017-03-21 ENCOUNTER — Telehealth: Payer: Self-pay | Admitting: Pharmacist

## 2017-03-21 NOTE — Telephone Encounter (Signed)
I received a phone call today from [facility] pt assistance program (ph# [phone]) stating they were going to send refill supply of Zometa.  They will ship to [facility] pharmacy de[facility]. I confirmed correct mailing address.  Kennith Center, Pharm.D., CPP 03/21/2017@12 :21 PM

## 2017-04-04 ENCOUNTER — Ambulatory Visit (HOSPITAL_BASED_OUTPATIENT_CLINIC_OR_DEPARTMENT_OTHER): Payer: Self-pay | Admitting: Oncology

## 2017-04-04 ENCOUNTER — Telehealth: Payer: Self-pay | Admitting: Oncology

## 2017-04-04 ENCOUNTER — Encounter: Payer: Self-pay | Admitting: Oncology

## 2017-04-04 ENCOUNTER — Other Ambulatory Visit (HOSPITAL_BASED_OUTPATIENT_CLINIC_OR_DEPARTMENT_OTHER): Payer: Self-pay

## 2017-04-04 ENCOUNTER — Telehealth: Payer: Self-pay | Admitting: Pharmacy Technician

## 2017-04-04 VITALS — BP 160/80 | HR 83 | Temp 97.8°F | Resp 20 | Ht 65.0 in | Wt 158.1 lb

## 2017-04-04 DIAGNOSIS — C61 Malignant neoplasm of prostate: Secondary | ICD-10-CM

## 2017-04-04 DIAGNOSIS — Q539 Undescended testicle, unspecified: Secondary | ICD-10-CM

## 2017-04-04 DIAGNOSIS — C7951 Secondary malignant neoplasm of bone: Secondary | ICD-10-CM

## 2017-04-04 DIAGNOSIS — E119 Type 2 diabetes mellitus without complications: Secondary | ICD-10-CM

## 2017-04-04 DIAGNOSIS — N289 Disorder of kidney and ureter, unspecified: Secondary | ICD-10-CM

## 2017-04-04 LAB — CBC WITH DIFFERENTIAL/PLATELET
BASO%: 0.6 % (ref 0.0–2.0)
Basophils Absolute: 0 10*3/uL (ref 0.0–0.1)
EOS%: 1.2 % (ref 0.0–7.0)
Eosinophils Absolute: 0.1 10*3/uL (ref 0.0–0.5)
HEMATOCRIT: 36.2 % — AB (ref 38.4–49.9)
HEMOGLOBIN: 11.9 g/dL — AB (ref 13.0–17.1)
LYMPH%: 29.9 % (ref 14.0–49.0)
MCH: 30.3 pg (ref 27.2–33.4)
MCHC: 32.8 g/dL (ref 32.0–36.0)
MCV: 92.5 fL (ref 79.3–98.0)
MONO#: 0.6 10*3/uL (ref 0.1–0.9)
MONO%: 8.4 % (ref 0.0–14.0)
NEUT%: 59.9 % (ref 39.0–75.0)
NEUTROS ABS: 4.6 10*3/uL (ref 1.5–6.5)
PLATELETS: 273 10*3/uL (ref 140–400)
RBC: 3.92 10*6/uL — ABNORMAL LOW (ref 4.20–5.82)
RDW: 14.2 % (ref 11.0–14.6)
WBC: 7.7 10*3/uL (ref 4.0–10.3)
lymph#: 2.3 10*3/uL (ref 0.9–3.3)

## 2017-04-04 LAB — COMPREHENSIVE METABOLIC PANEL
ALT: 13 U/L (ref 0–55)
ANION GAP: 10 meq/L (ref 3–11)
AST: 12 U/L (ref 5–34)
Albumin: 3.6 g/dL (ref 3.5–5.0)
Alkaline Phosphatase: 64 U/L (ref 40–150)
BILIRUBIN TOTAL: 0.4 mg/dL (ref 0.20–1.20)
BUN: 19 mg/dL (ref 7.0–26.0)
CALCIUM: 9.7 mg/dL (ref 8.4–10.4)
CO2: 27 meq/L (ref 22–29)
CREATININE: 0.8 mg/dL (ref 0.7–1.3)
Chloride: 106 mEq/L (ref 98–109)
EGFR: >90 mL/min/{1.73_m2} (ref >90)
Glucose: 121 mg/dl (ref 70–140)
Potassium: 3.6 mEq/L (ref 3.5–5.1)
Sodium: 142 mEq/L (ref 136–145)
Total Protein: 6.8 g/dL (ref 6.4–8.3)

## 2017-04-04 NOTE — Progress Notes (Signed)
  [place] OFFICE PROGRESS NOTE   Diagnosis: Prostate cancer  INTERVAL HISTORY:   Brandon Peters returns as scheduled. He feels well. No pain. Good appetite. He continues Abiraterone/prednisone. No complaint.  Objective:  Vital signs in last 24 hours:  Blood pressure (!) 160/80, pulse 83, temperature 97.8 F (36.6 C), temperature source Oral, resp. rate 20, height 5\' 5"  (1.651 m), weight 158 lb 1.6 oz (71.7 kg), SpO2 100 %.    HEENT: No thrush Resp: Lungs clear bilaterally Cardio: Regular rate and rhythm GI: No hepatosplenomegaly, nontender Vascular: No leg edema   Lab Results:  Lab Results  Component Value Date   WBC 7.7 04/04/2017   HGB 11.9 (L) 04/04/2017   HCT 36.2 (L) 04/04/2017   MCV 92.5 04/04/2017   PLT 273 04/04/2017   NEUTROABS 4.6 04/04/2017    CMP     Component Value Date/Time   NA 142 04/04/2017 0917   K 3.6 04/04/2017 0917   CL 100 (L) 03/05/2016 0454   CO2 27 04/04/2017 0917   GLUCOSE 121 04/04/2017 0917   BUN 19.0 04/04/2017 0917   CREATININE 0.8 04/04/2017 0917   CALCIUM 9.7 04/04/2017 0917   PROT 6.8 04/04/2017 0917   ALBUMIN 3.6 04/04/2017 0917   AST 12 04/04/2017 0917   ALT 13 04/04/2017 0917   ALKPHOS 64 04/04/2017 0917   BILITOT 0.40 04/04/2017 0917   GFRNONAA >60 03/05/2016 0454   GFRAA >60 03/05/2016 0454  02/21/2017: PSA-0.1  Medications: I have reviewed the patient's current medications.  Assessment/Plan: 1. History of Severe anemia-status post a red cell transfusion 03/04/2016 2.. Prostate cancer  Extensive sclerotic bone metastases noted on a CT of the abdomen/pelvis 03/04/2016 and MRI abdomen 03/05/2016  Markedly elevated PSA  Lupron initiated 03/15/2016; Casodex 14 days beginning 03/15/2016  Prostate biopsy 03/18/2016  05/09/2016 PSA significantly improved at 21  Abirateroneinitiated08/15/2017  3. Symptoms of obstructive uropathy. Improved since beginning a new medication prescribed by  urology.  4. 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 Anorexia/weight loss-Improved  6. Undescended testicles  7. Right renal mass.   8. Diabetes    Disposition:  Brandon Peters appears asymptomatic from the metastatic prostate cancer. The plan is to continue Abiraterone/prednisone and Lupron. He will return for an office visit and Zometa/Lupron in 6 weeks.  We will follow-up on the PSA from today.  15 minutes were spent with the patient today. The majority of the time was used for counseling and coordination of care.  Donneta Romberg, MD  04/04/2017  10:17 AM

## 2017-04-04 NOTE — Telephone Encounter (Signed)
Gave patient avs report and appointments for August.  °

## 2017-04-04 NOTE — Telephone Encounter (Signed)
Oral Oncology Patient Advocate Encounter  Received a fax from [place] [employer] [place] this morning that Brandon Peters application was due for annual renewal.    He is going to bring income documents tomorrow and the renewal application will be submitted.    This encounter will be updated until final determination.   Fabio Asa. Melynda Keller, [place] Patient [facility] [phone] 04/04/2017 12:27 PM

## 2017-04-04 NOTE — Progress Notes (Signed)
Received renewal application from [employer] for Zytiga. Placed on [place] desk.

## 2017-04-05 LAB — PSA

## 2017-04-06 NOTE — Telephone Encounter (Signed)
Oral Oncology Patient Advocate Encounter  Met patient in lobby to complete a renewal application for [employer] and [place] in an effort to reduce patient's out of pocket expense for Zytiga to $0.    Application completed and faxed to [phone].   [employer] patient assistance phone number for follow up is [phone].   This encounter will be updated until final determination.   Fabio Asa. Melynda Keller, [place] Patient [facility] [phone] 04/06/2017 12:05 PM

## 2017-04-11 ENCOUNTER — Other Ambulatory Visit: Payer: Self-pay | Admitting: Oncology

## 2017-04-11 MED FILL — predniSONE 5 MG TABS: 5 | 45 days supply | Qty: 90 | Fill #0

## 2017-05-02 NOTE — Telephone Encounter (Signed)
Oral Oncology Patient Advocate Encounter  Received notification from [facility] and [place] Patient Assistance program that patient has been successfully re-enrolled into their program to receive Zytiga from the manufacturer at $0 out of pocket until 04/19/2018.   I called and left a message for the patient with the good news.    I advised the patient  to call the office with any additional questions or concerns.    Gilmore Laroche, CPhT, [place] Oral Oncology Patient Advocate [phone] 05/02/2017 9:53 AM

## 2017-05-16 ENCOUNTER — Telehealth: Payer: Self-pay | Admitting: Oncology

## 2017-05-16 ENCOUNTER — Ambulatory Visit: Payer: Self-pay

## 2017-05-16 ENCOUNTER — Ambulatory Visit (HOSPITAL_BASED_OUTPATIENT_CLINIC_OR_DEPARTMENT_OTHER): Payer: Self-pay | Admitting: Oncology

## 2017-05-16 ENCOUNTER — Other Ambulatory Visit (HOSPITAL_BASED_OUTPATIENT_CLINIC_OR_DEPARTMENT_OTHER): Payer: Self-pay

## 2017-05-16 ENCOUNTER — Ambulatory Visit (HOSPITAL_BASED_OUTPATIENT_CLINIC_OR_DEPARTMENT_OTHER): Payer: Self-pay

## 2017-05-16 VITALS — BP 165/97 | HR 88 | Temp 98.0°F | Resp 19 | Ht 65.0 in | Wt 162.4 lb

## 2017-05-16 DIAGNOSIS — N289 Disorder of kidney and ureter, unspecified: Secondary | ICD-10-CM

## 2017-05-16 DIAGNOSIS — Z5111 Encounter for antineoplastic chemotherapy: Secondary | ICD-10-CM

## 2017-05-16 DIAGNOSIS — Q539 Undescended testicle, unspecified: Secondary | ICD-10-CM

## 2017-05-16 DIAGNOSIS — C61 Malignant neoplasm of prostate: Secondary | ICD-10-CM

## 2017-05-16 DIAGNOSIS — E119 Type 2 diabetes mellitus without complications: Secondary | ICD-10-CM

## 2017-05-16 DIAGNOSIS — C7951 Secondary malignant neoplasm of bone: Secondary | ICD-10-CM

## 2017-05-16 LAB — COMPREHENSIVE METABOLIC PANEL
ALT: 16 U/L (ref 0–55)
ANION GAP: 9 meq/L (ref 3–11)
AST: 14 U/L (ref 5–34)
Albumin: 3.5 g/dL (ref 3.5–5.0)
Alkaline Phosphatase: 60 U/L (ref 40–150)
BILIRUBIN TOTAL: 0.42 mg/dL (ref 0.20–1.20)
BUN: 21.8 mg/dL (ref 7.0–26.0)
CHLORIDE: 108 meq/L (ref 98–109)
CO2: 26 meq/L (ref 22–29)
Calcium: 10 mg/dL (ref 8.4–10.4)
Creatinine: 0.8 mg/dL (ref 0.7–1.3)
Glucose: 108 mg/dl (ref 70–140)
Potassium: 3.4 mEq/L — ABNORMAL LOW (ref 3.5–5.1)
Sodium: 143 mEq/L (ref 136–145)
TOTAL PROTEIN: 6.8 g/dL (ref 6.4–8.3)

## 2017-05-16 LAB — CBC WITH DIFFERENTIAL/PLATELET
BASO%: 0.6 % (ref 0.0–2.0)
BASOS ABS: 0 10*3/uL (ref 0.0–0.1)
EOS ABS: 0.1 10*3/uL (ref 0.0–0.5)
EOS%: 1.2 % (ref 0.0–7.0)
HCT: 36.8 % — ABNORMAL LOW (ref 38.4–49.9)
HGB: 11.9 g/dL — ABNORMAL LOW (ref 13.0–17.1)
LYMPH%: 25.9 % (ref 14.0–49.0)
MCH: 29.8 pg (ref 27.2–33.4)
MCHC: 32.3 g/dL (ref 32.0–36.0)
MCV: 92.4 fL (ref 79.3–98.0)
MONO#: 0.6 10*3/uL (ref 0.1–0.9)
MONO%: 7.6 % (ref 0.0–14.0)
NEUT#: 5 10*3/uL (ref 1.5–6.5)
NEUT%: 64.7 % (ref 39.0–75.0)
PLATELETS: 285 10*3/uL (ref 140–400)
RBC: 3.98 10*6/uL — AB (ref 4.20–5.82)
RDW: 14.4 % (ref 11.0–14.6)
WBC: 7.8 10*3/uL (ref 4.0–10.3)
lymph#: 2 10*3/uL (ref 0.9–3.3)

## 2017-05-16 MED ORDER — ZOLEDRONIC ACID 4 MG/100ML IV SOLN: 4.0000 mg | Freq: Once | INTRAVENOUS | Status: DC

## 2017-05-16 MED ORDER — ZOLEDRONIC ACID 4 MG/5ML IV CONC: 4.0000 mg | Freq: Once | INTRAVENOUS | Status: DC

## 2017-05-16 MED ORDER — SODIUM CHLORIDE 0.9 % IV SOLN
4.0000 mg | INTRAVENOUS | Status: DC
  Administered 2017-05-16: 4 mg via INTRAVENOUS
  Filled 2017-05-16: qty 5

## 2017-05-16 MED ORDER — LEUPROLIDE ACETATE (3 MONTH) 22.5 MG IM KIT
22.5000 mg | PACK | Freq: Once | INTRAMUSCULAR | Status: AC
  Administered 2017-05-16: 22.5 mg via INTRAMUSCULAR
  Filled 2017-05-16: qty 22.5

## 2017-05-16 MED ORDER — SODIUM CHLORIDE 0.9 % IV SOLN
Freq: Once | INTRAVENOUS | Status: AC
  Administered 2017-05-16: 12:00:00 via INTRAVENOUS

## 2017-05-16 NOTE — Telephone Encounter (Signed)
Spoke with pt regarding his appt on 10/1 and gave him a calendar.

## 2017-05-16 NOTE — Progress Notes (Signed)
  [facility] OFFICE PROGRESS NOTE   Diagnosis: Prostate cancer  INTERVAL HISTORY:   [name] returns as scheduled. He continues Abiraterone/prednisone. He last received Lupron and Zometa on 02/21/2017. He feels well. No difficulty with bowel or bladder function. No jaw pain. He has hot flashes.  Objective:  Vital signs in last 24 hours:  Blood pressure (!) 165/97, pulse 88, temperature 98 F (36.7 C), temperature source Oral, resp. rate 19, height 5\' 5"  (1.651 m), weight 162 lb 6.4 oz (73.7 kg), SpO2 100 %.    HEENT: No thrush or ulcers Resp: End inspiratory fine rales at the right greater than left posterior base, no respiratory distress Cardio: Regular rate and rhythm GI: No hepatosplenomegaly, nontender, no mass Vascular: No leg edema   Lab Results:  Lab Results  Component Value Date   WBC 7.8 05/16/2017   HGB 11.9 (L) 05/16/2017   HCT 36.8 (L) 05/16/2017   MCV 92.4 05/16/2017   PLT 285 05/16/2017   NEUTROABS 5.0 05/16/2017    CMP     Component Value Date/Time   NA 143 05/16/2017 0937   K 3.4 (L) 05/16/2017 0937   CL 100 (L) 03/05/2016 0454   CO2 26 05/16/2017 0937   GLUCOSE 108 05/16/2017 0937   BUN 21.8 05/16/2017 0937   CREATININE 0.8 05/16/2017 0937   CALCIUM 10.0 05/16/2017 0937   PROT 6.8 05/16/2017 0937   ALBUMIN 3.5 05/16/2017 0937   AST 14 05/16/2017 0937   ALT 16 05/16/2017 0937   ALKPHOS 60 05/16/2017 0937   BILITOT 0.42 05/16/2017 0937   GFRNONAA >60 03/05/2016 0454   GFRAA >60 03/05/2016 0454   PSA on 04/04/2017-less than 0.1  Medications: I have reviewed the patient's current medications.  Assessment/Plan: 1. History of Severe anemia-status post a red cell transfusion 03/04/2016 2.. Prostate cancer  Extensive sclerotic bone metastases noted on a CT of the abdomen/pelvis 03/04/2016 and MRI abdomen 03/05/2016  Markedly elevated PSA  Lupron initiated 03/15/2016; Casodex 14 days beginning 03/15/2016  Prostate biopsy  03/18/2016  05/09/2016 PSA significantly improved at 21  Abirateroneinitiated08/15/2017  3. Symptoms of obstructive uropathy. Improved .  4. 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 Anorexia/weight loss-Improved  6. Undescended testicles  7. Right renal mass.   8. Diabetes   Disposition:  [name] appears well. He is tolerating the Abiraterone and Lupron well. The plan is to continue the current treatment with every three-month Lupron/Zometa. He will return for an office and lab visit in 6 weeks.  15 minutes were spent with the patient today. The majority of the time was used for counseling and coordination of care.   Donneta Romberg, MD  05/16/2017  10:41 AM

## 2017-05-16 NOTE — Addendum Note (Signed)
Addended by: [name] B on: 05/16/2017 02:41 PM   Modules accepted: Orders

## 2017-05-16 NOTE — Progress Notes (Signed)
Lupron Injection was given in the Infusion Room

## 2017-05-16 NOTE — Patient Instructions (Addendum)
Zoledronic Acid injection (Hypercalcemia, Oncology) What is this medicine? ZOLEDRONIC ACID (ZOE le dron ik AS id) lowers the amount of calcium loss from bone. It is used to treat too much calcium in your blood from cancer. It is also used to prevent complications of cancer that has spread to the bone. This medicine may be used for other purposes; ask your health care provider or pharmacist if you have questions. COMMON BRAND NAME(S): Zometa What should I tell my health care provider before I take this medicine? They need to know if you have any of these conditions: -aspirin-sensitive asthma -cancer, especially if you are receiving medicines used to treat cancer -dental disease or wear dentures -infection -kidney disease -receiving corticosteroids like dexamethasone or prednisone -an unusual or allergic reaction to zoledronic acid, other medicines, foods, dyes, or preservatives -pregnant or trying to get pregnant -breast-feeding How should I use this medicine? This medicine is for infusion into a vein. It is given by a health care professional in a hospital or clinic setting. Talk to your pediatrician regarding the use of this medicine in children. Special care may be needed. Overdosage: If you think you have taken too much of this medicine contact a poison control center or emergency room at once. NOTE: This medicine is only for you. Do not share this medicine with others. What if I miss a dose? It is important not to miss your dose. Call your doctor or health care professional if you are unable to keep an appointment. What may interact with this medicine? -certain antibiotics given by injection -NSAIDs, medicines for pain and inflammation, like ibuprofen or naproxen -some diuretics like bumetanide, furosemide -teriparatide -thalidomide This list may not describe all possible interactions. Give your health care provider a list of all the medicines, herbs, non-prescription drugs, or  dietary supplements you use. Also tell them if you smoke, drink alcohol, or use illegal drugs. Some items may interact with your medicine. What should I watch for while using this medicine? Visit your doctor or health care professional for regular checkups. It may be some time before you see the benefit from this medicine. Do not stop taking your medicine unless your doctor tells you to. Your doctor may order blood tests or other tests to see how you are doing. Women should inform their doctor if they wish to become pregnant or think they might be pregnant. There is a potential for serious side effects to an unborn child. Talk to your health care professional or pharmacist for more information. You should make sure that you get enough calcium and vitamin D while you are taking this medicine. Discuss the foods you eat and the vitamins you take with your health care professional. Some people who take this medicine have severe bone, joint, and/or muscle pain. This medicine may also increase your risk for jaw problems or a broken thigh bone. Tell your doctor right away if you have severe pain in your jaw, bones, joints, or muscles. Tell your doctor if you have any pain that does not go away or that gets worse. Tell your dentist and dental surgeon that you are taking this medicine. You should not have major dental surgery while on this medicine. See your dentist to have a dental exam and fix any dental problems before starting this medicine. Take good care of your teeth while on this medicine. Make sure you see your dentist for regular follow-up appointments. What side effects may I notice from receiving this medicine? Side effects that   you should report to your doctor or health care professional as soon as possible: -allergic reactions like skin rash, itching or hives, swelling of the face, lips, or tongue -anxiety, confusion, or depression -breathing problems -changes in vision -eye pain -feeling faint or  lightheaded, falls -jaw pain, especially after dental work -mouth sores -muscle cramps, stiffness, or weakness -redness, blistering, peeling or loosening of the skin, including inside the mouth -trouble passing urine or change in the amount of urine Side effects that usually do not require medical attention (report to your doctor or health care professional if they continue or are bothersome): -bone, joint, or muscle pain -constipation -diarrhea -fever -hair loss -irritation at site where injected -loss of appetite -nausea, vomiting -stomach upset -trouble sleeping -trouble swallowing -weak or tired This list may not describe all possible side effects. Call your doctor for medical advice about side effects. You may report side effects to FDA at 1-800-FDA-1088. Where should I keep my medicine? This drug is given in a hospital or clinic and will not be stored at home. NOTE: This sheet is a summary. It may not cover all possible information. If you have questions about this medicine, talk to your doctor, pharmacist, or health care provider.  2018 Elsevier/Gold Standard (2014-02-08 14:19:39)  Leuprolide injection What is this medicine? LEUPROLIDE (loo PROE lide) is a man-made hormone. It is used to treat the symptoms of prostate cancer. This medicine may also be used to treat children with early onset of puberty. It may be used for other hormonal conditions. This medicine may be used for other purposes; ask your health care provider or pharmacist if you have questions. COMMON BRAND NAME(S): Lupron What should I tell my health care provider before I take this medicine? They need to know if you have any of these conditions: -diabetes -heart disease or previous heart attack -high blood pressure -high cholesterol -pain or difficulty passing urine -spinal cord metastasis -stroke -tobacco smoker -an unusual or allergic reaction to leuprolide, benzyl alcohol, other medicines, foods,  dyes, or preservatives -pregnant or trying to get pregnant -breast-feeding How should I use this medicine? This medicine is for injection under the skin or into a muscle. You will be taught how to prepare and give this medicine. Use exactly as directed. Take your medicine at regular intervals. Do not take your medicine more often than directed. It is important that you put your used needles and syringes in a special sharps container. Do not put them in a trash can. If you do not have a sharps container, call your pharmacist or healthcare provider to get one. A special MedGuide will be given to you by the pharmacist with each prescription and refill. Be sure to read this information carefully each time. Talk to your pediatrician regarding the use of this medicine in children. While this medicine may be prescribed for children as young as 8 years for selected conditions, precautions do apply. Overdosage: If you think you have taken too much of this medicine contact a poison control center or emergency room at once. NOTE: This medicine is only for you. Do not share this medicine with others. What if I miss a dose? If you miss a dose, take it as soon as you can. If it is almost time for your next dose, take only that dose. Do not take double or extra doses. What may interact with this medicine? Do not take this medicine with any of the following medications: -chasteberry This medicine may also interact with   the following medications: -herbal or dietary supplements, like black cohosh or DHEA -male hormones, like estrogens or progestins and birth control pills, patches, rings, or injections -male hormones, like testosterone This list may not describe all possible interactions. Give your health care provider a list of all the medicines, herbs, non-prescription drugs, or dietary supplements you use. Also tell them if you smoke, drink alcohol, or use illegal drugs. Some items may interact with your  medicine. What should I watch for while using this medicine? Visit your doctor or health care professional for regular checks on your progress. During the first week, your symptoms may get worse, but then will improve as you continue your treatment. You may get hot flashes, increased bone pain, increased difficulty passing urine, or an aggravation of nerve symptoms. Discuss these effects with your doctor or health care professional, some of them may improve with continued use of this medicine. Male patients may experience a menstrual cycle or spotting during the first 2 months of therapy with this medicine. If this continues, contact your doctor or health care professional. What side effects may I notice from receiving this medicine? Side effects that you should report to your doctor or health care professional as soon as possible: -allergic reactions like skin rash, itching or hives, swelling of the face, lips, or tongue -breathing problems -chest pain -depression or memory disorders -pain in your legs or groin -pain at site where injected -severe headache -swelling of the feet and legs -visual changes -vomiting Side effects that usually do not require medical attention (report to your doctor or health care professional if they continue or are bothersome): -breast swelling or tenderness -decrease in sex drive or performance -diarrhea -hot flashes -loss of appetite -muscle, joint, or bone pains -nausea -redness or irritation at site where injected -skin problems or acne This list may not describe all possible side effects. Call your doctor for medical advice about side effects. You may report side effects to FDA at 1-800-FDA-1088. Where should I keep my medicine? Keep out of the reach of children. Store below 25 degrees C (77 degrees F). Do not freeze. Protect from light. Do not use if it is not clear or if there are particles present. Throw away any unused medicine after the  expiration date. NOTE: This sheet is a summary. It may not cover all possible information. If you have questions about this medicine, talk to your doctor, pharmacist, or health care provider.  2018 Elsevier/Gold Standard (2016-05-02 10:54:35)  

## 2017-05-16 NOTE — Addendum Note (Signed)
Addended by: [name] B on: 05/16/2017 11:39 AM   Modules accepted: Orders

## 2017-05-17 LAB — PSA: Prostate Specific Ag, Serum: <0.1 ng/mL (ref 0.0–4.0)

## 2017-05-24 ENCOUNTER — Other Ambulatory Visit: Payer: Self-pay | Admitting: Nurse Practitioner

## 2017-05-26 ENCOUNTER — Telehealth: Payer: Self-pay

## 2017-05-26 ENCOUNTER — Other Ambulatory Visit: Payer: Self-pay | Admitting: *Deleted

## 2017-05-26 MED ORDER — ABIRATERONE ACETATE 250 MG PO TABS: 1000.0000 mg | ORAL_TABLET | Freq: Every day | ORAL | 2 refills | Status: DC

## 2017-05-26 MED ORDER — PREDNISONE 5 MG PO TABS
5.0000 mg | ORAL_TABLET | Freq: Two times a day (BID) | ORAL | 0 refills | Status: DC
Start: 2017-05-26 — End: 2017-07-10

## 2017-05-26 MED FILL — predniSONE 5 MG TABS: 5 | 45 days supply | Qty: 90 | Fill #0

## 2017-05-26 NOTE — Telephone Encounter (Signed)
Incoming phone from pharmacy for zytiga refill. Per OV note 05/16/17 refill sent.

## 2017-05-31 ENCOUNTER — Other Ambulatory Visit: Payer: Self-pay | Admitting: *Deleted

## 2017-05-31 ENCOUNTER — Telehealth: Payer: Self-pay

## 2017-05-31 MED ORDER — ABIRATERONE ACETATE 250 MG PO TABS: 1000.0000 mg | ORAL_TABLET | Freq: Every day | ORAL | 2 refills | Status: DC

## 2017-05-31 NOTE — Telephone Encounter (Signed)
[facility] pt assistance pharmacy called for zytiga refill. This RN looked at Rx and realized the e-scribe on 8/31 failed. Rx was phoned into [facility].

## 2017-06-26 ENCOUNTER — Ambulatory Visit (HOSPITAL_BASED_OUTPATIENT_CLINIC_OR_DEPARTMENT_OTHER): Payer: Self-pay | Admitting: Nurse Practitioner

## 2017-06-26 ENCOUNTER — Other Ambulatory Visit (HOSPITAL_BASED_OUTPATIENT_CLINIC_OR_DEPARTMENT_OTHER): Payer: Self-pay

## 2017-06-26 ENCOUNTER — Telehealth: Payer: Self-pay | Admitting: Oncology

## 2017-06-26 VITALS — BP 169/87 | HR 78 | Temp 97.7°F | Resp 17 | Ht 65.0 in | Wt 161.2 lb

## 2017-06-26 DIAGNOSIS — Z23 Encounter for immunization: Secondary | ICD-10-CM

## 2017-06-26 DIAGNOSIS — C7951 Secondary malignant neoplasm of bone: Secondary | ICD-10-CM

## 2017-06-26 DIAGNOSIS — Q539 Undescended testicle, unspecified: Secondary | ICD-10-CM

## 2017-06-26 DIAGNOSIS — C61 Malignant neoplasm of prostate: Secondary | ICD-10-CM

## 2017-06-26 DIAGNOSIS — N289 Disorder of kidney and ureter, unspecified: Secondary | ICD-10-CM

## 2017-06-26 LAB — COMPREHENSIVE METABOLIC PANEL
ALK PHOS: 63 U/L (ref 40–150)
ALT: 15 U/L (ref 0–55)
ANION GAP: 10 meq/L (ref 3–11)
AST: 14 U/L (ref 5–34)
Albumin: 3.4 g/dL — ABNORMAL LOW (ref 3.5–5.0)
BUN: 15.7 mg/dL (ref 7.0–26.0)
CALCIUM: 9.4 mg/dL (ref 8.4–10.4)
CHLORIDE: 107 meq/L (ref 98–109)
CO2: 25 mEq/L (ref 22–29)
Creatinine: 0.8 mg/dL (ref 0.7–1.3)
Glucose: 116 mg/dl (ref 70–140)
POTASSIUM: 3.6 meq/L (ref 3.5–5.1)
Sodium: 142 mEq/L (ref 136–145)
TOTAL PROTEIN: 6.6 g/dL (ref 6.4–8.3)
Total Bilirubin: 0.33 mg/dL (ref 0.20–1.20)

## 2017-06-26 MED ORDER — INFLUENZA VAC SPLIT QUAD 0.5 ML IM SUSY
0.5000 mL | PREFILLED_SYRINGE | Freq: Once | INTRAMUSCULAR | Status: AC
  Administered 2017-06-26: 0.5 mL via INTRAMUSCULAR
  Filled 2017-06-26: qty 0.5

## 2017-06-26 NOTE — Progress Notes (Signed)
  [facility] OFFICE PROGRESS NOTE   Diagnosis:  Prostate cancer  INTERVAL HISTORY:   Brandon Peters returns as scheduled. He continues Abiraterone/Prednisone. He last had Lupron and Zometa 05/16/2017. He feels well. He denies nausea/vomiting. No mouth sores. No diarrhea. No rash. No urinary symptoms. He continues to have hot flashes. No pain. He has a good appetite.  Objective:  Vital signs in last 24 hours:  Blood pressure (!) 169/87, pulse 78, temperature 97.7 F (36.5 C), temperature source Oral, resp. rate 17, height 5\' 5"  (1.651 m), weight 161 lb 3.2 oz (73.1 kg), SpO2 99 %.    HEENT: No thrush or ulcers. Resp: Lungs clear bilaterally. Cardio: Regular rate and rhythm. GI: Abdomen soft and nontender. No hepatomegaly. Vascular: Trace pitting edema at the lower legs bilaterally.    Lab Results:  Lab Results  Component Value Date   WBC 7.8 05/16/2017   HGB 11.9 (L) 05/16/2017   HCT 36.8 (L) 05/16/2017   MCV 92.4 05/16/2017   PLT 285 05/16/2017   NEUTROABS 5.0 05/16/2017    Imaging:  No results found.  Medications: I have reviewed the patient's current medications.  Assessment/Plan: 1. History of Severe anemia-status post a red cell transfusion 03/04/2016 2.. Prostate cancer  Extensive sclerotic bone metastases noted on a CT of the abdomen/pelvis 03/04/2016 and MRI abdomen 03/05/2016  Markedly elevated PSA  Lupron initiated 03/15/2016; Casodex 14 days beginning 03/15/2016  Prostate biopsy 03/18/2016  05/09/2016 PSA significantly improved at 21  Abirateroneinitiated08/15/2017  3. Symptoms of obstructive uropathy. Improved .  4. 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 Anorexia/weight loss-Improved  6. Undescended  testicles  7. Right renal mass.   8. Diabetes   Disposition: Brandon Peters appears well. The plan is to continue Abiraterone.  He will also continue Lupron/Zometa every 3 months. We will follow-up on the PSA from today. He will return for a follow-up visit, Lupron and Zometa in 6 weeks. He will contact the office in the interim with any problems.  The influenza vaccine will be administered today.  Ned Card ANP/GNP-BC   06/26/2017  11:07 AM

## 2017-06-26 NOTE — Telephone Encounter (Signed)
Gave patient avs report and appointments for November.  °

## 2017-06-27 LAB — PSA: Prostate Specific Ag, Serum: <0.1 ng/mL (ref 0.0–4.0)

## 2017-07-10 ENCOUNTER — Other Ambulatory Visit: Payer: Self-pay | Admitting: Oncology

## 2017-07-10 MED FILL — predniSONE 5 MG TABS: 5 | 45 days supply | Qty: 90 | Fill #0

## 2017-07-12 ENCOUNTER — Encounter: Payer: Self-pay | Admitting: Pharmacy Technician

## 2017-07-12 NOTE — Progress Notes (Addendum)
The patient was re enrolled for drug assistance Lupron 22.5 mg Q 3 months. Coverage is from 07/10/17 - 07/10/18 and is based on Self Pay. Patient was also was re enrolled for Zometa with coverage from 07/07/17 - 07/07/18.

## 2017-07-14 ENCOUNTER — Telehealth: Payer: Self-pay | Admitting: Medical Oncology

## 2017-07-14 NOTE — Telephone Encounter (Signed)
Please call to schedule  Delivery. [phone]

## 2017-08-08 ENCOUNTER — Other Ambulatory Visit (HOSPITAL_BASED_OUTPATIENT_CLINIC_OR_DEPARTMENT_OTHER): Payer: Self-pay

## 2017-08-08 ENCOUNTER — Ambulatory Visit (HOSPITAL_BASED_OUTPATIENT_CLINIC_OR_DEPARTMENT_OTHER): Payer: Self-pay | Admitting: Oncology

## 2017-08-08 ENCOUNTER — Ambulatory Visit (HOSPITAL_BASED_OUTPATIENT_CLINIC_OR_DEPARTMENT_OTHER): Payer: Self-pay

## 2017-08-08 ENCOUNTER — Telehealth: Payer: Self-pay | Admitting: Oncology

## 2017-08-08 VITALS — BP 148/91 | HR 98 | Temp 97.8°F | Resp 18 | Ht 65.0 in | Wt 160.8 lb

## 2017-08-08 DIAGNOSIS — N289 Disorder of kidney and ureter, unspecified: Secondary | ICD-10-CM

## 2017-08-08 DIAGNOSIS — C61 Malignant neoplasm of prostate: Secondary | ICD-10-CM

## 2017-08-08 DIAGNOSIS — C7951 Secondary malignant neoplasm of bone: Secondary | ICD-10-CM

## 2017-08-08 DIAGNOSIS — E119 Type 2 diabetes mellitus without complications: Secondary | ICD-10-CM

## 2017-08-08 DIAGNOSIS — E876 Hypokalemia: Secondary | ICD-10-CM

## 2017-08-08 DIAGNOSIS — Z5111 Encounter for antineoplastic chemotherapy: Secondary | ICD-10-CM

## 2017-08-08 DIAGNOSIS — Q539 Undescended testicle, unspecified: Secondary | ICD-10-CM

## 2017-08-08 LAB — CBC WITH DIFFERENTIAL/PLATELET
BASO%: 0.9 % (ref 0.0–2.0)
Basophils Absolute: 0.1 10*3/uL (ref 0.0–0.1)
EOS%: 1 % (ref 0.0–7.0)
Eosinophils Absolute: 0.1 10*3/uL (ref 0.0–0.5)
HCT: 38.3 % — ABNORMAL LOW (ref 38.4–49.9)
HGB: 12.6 g/dL — ABNORMAL LOW (ref 13.0–17.1)
LYMPH#: 2.5 10*3/uL (ref 0.9–3.3)
LYMPH%: 27.8 % (ref 14.0–49.0)
MCH: 29.8 pg (ref 27.2–33.4)
MCHC: 32.7 g/dL (ref 32.0–36.0)
MCV: 91 fL (ref 79.3–98.0)
MONO#: 0.7 10*3/uL (ref 0.1–0.9)
MONO%: 7.3 % (ref 0.0–14.0)
NEUT#: 5.7 10*3/uL (ref 1.5–6.5)
NEUT%: 63 % (ref 39.0–75.0)
Platelets: 310 10*3/uL (ref 140–400)
RBC: 4.21 10*6/uL (ref 4.20–5.82)
RDW: 14.3 % (ref 11.0–14.6)
WBC: 9.1 10*3/uL (ref 4.0–10.3)

## 2017-08-08 LAB — COMPREHENSIVE METABOLIC PANEL
ALBUMIN: 3.6 g/dL (ref 3.5–5.0)
ALK PHOS: 68 U/L (ref 40–150)
ALT: 22 U/L (ref 0–55)
AST: 16 U/L (ref 5–34)
Anion Gap: 10 mEq/L (ref 3–11)
BILIRUBIN TOTAL: 0.43 mg/dL (ref 0.20–1.20)
BUN: 21.6 mg/dL (ref 7.0–26.0)
CO2: 28 mEq/L (ref 22–29)
Calcium: 9.9 mg/dL (ref 8.4–10.4)
Chloride: 102 mEq/L (ref 98–109)
Creatinine: 0.8 mg/dL (ref 0.7–1.3)
GLUCOSE: 177 mg/dL — AB (ref 70–140)
Potassium: 3.1 mEq/L — ABNORMAL LOW (ref 3.5–5.1)
SODIUM: 140 meq/L (ref 136–145)
TOTAL PROTEIN: 7.2 g/dL (ref 6.4–8.3)

## 2017-08-08 MED ORDER — ZOLEDRONIC ACID 4 MG/5ML IV CONC
4.0000 mg | Freq: Once | INTRAVENOUS | Status: AC
  Administered 2017-08-08: 4 mg via INTRAVENOUS
  Filled 2017-08-08: qty 5

## 2017-08-08 MED ORDER — SODIUM CHLORIDE 0.9 % IV SOLN
Freq: Once | INTRAVENOUS | Status: AC
  Administered 2017-08-08: 13:00:00 via INTRAVENOUS

## 2017-08-08 MED ORDER — ZOLEDRONIC ACID 4 MG/100ML IV SOLN: 4.0000 mg | Freq: Once | INTRAVENOUS | Status: DC

## 2017-08-08 MED ORDER — LEUPROLIDE ACETATE (3 MONTH) 22.5 MG IM KIT
22.5000 mg | PACK | Freq: Once | INTRAMUSCULAR | Status: AC
  Administered 2017-08-08: 22.5 mg via INTRAMUSCULAR
  Filled 2017-08-08: qty 22.5

## 2017-08-08 NOTE — Telephone Encounter (Signed)
Gave avs and calendar for February 2019 °

## 2017-08-08 NOTE — Patient Instructions (Signed)
Zoledronic Acid injection (Hypercalcemia, Oncology) What is this medicine? ZOLEDRONIC ACID (ZOE le dron ik AS id) lowers the amount of calcium loss from bone. It is used to treat too much calcium in your blood from cancer. It is also used to prevent complications of cancer that has spread to the bone. This medicine may be used for other purposes; ask your health care provider or pharmacist if you have questions. COMMON BRAND NAME(S): Zometa What should I tell my health care provider before I take this medicine? They need to know if you have any of these conditions: -aspirin-sensitive asthma -cancer, especially if you are receiving medicines used to treat cancer -dental disease or wear dentures -infection -kidney disease -receiving corticosteroids like dexamethasone or prednisone -an unusual or allergic reaction to zoledronic acid, other medicines, foods, dyes, or preservatives -pregnant or trying to get pregnant -breast-feeding How should I use this medicine? This medicine is for infusion into a vein. It is given by a health care professional in a hospital or clinic setting. Talk to your pediatrician regarding the use of this medicine in children. Special care may be needed. Overdosage: If you think you have taken too much of this medicine contact a poison control center or emergency room at once. NOTE: This medicine is only for you. Do not share this medicine with others. What if I miss a dose? It is important not to miss your dose. Call your doctor or health care professional if you are unable to keep an appointment. What may interact with this medicine? -certain antibiotics given by injection -NSAIDs, medicines for pain and inflammation, like ibuprofen or naproxen -some diuretics like bumetanide, furosemide -teriparatide -thalidomide This list may not describe all possible interactions. Give your health care provider a list of all the medicines, herbs, non-prescription drugs, or  dietary supplements you use. Also tell them if you smoke, drink alcohol, or use illegal drugs. Some items may interact with your medicine. What should I watch for while using this medicine? Visit your doctor or health care professional for regular checkups. It may be some time before you see the benefit from this medicine. Do not stop taking your medicine unless your doctor tells you to. Your doctor may order blood tests or other tests to see how you are doing. Women should inform their doctor if they wish to become pregnant or think they might be pregnant. There is a potential for serious side effects to an unborn child. Talk to your health care professional or pharmacist for more information. You should make sure that you get enough calcium and vitamin D while you are taking this medicine. Discuss the foods you eat and the vitamins you take with your health care professional. Some people who take this medicine have severe bone, joint, and/or muscle pain. This medicine may also increase your risk for jaw problems or a broken thigh bone. Tell your doctor right away if you have severe pain in your jaw, bones, joints, or muscles. Tell your doctor if you have any pain that does not go away or that gets worse. Tell your dentist and dental surgeon that you are taking this medicine. You should not have major dental surgery while on this medicine. See your dentist to have a dental exam and fix any dental problems before starting this medicine. Take good care of your teeth while on this medicine. Make sure you see your dentist for regular follow-up appointments. What side effects may I notice from receiving this medicine? Side effects that   you should report to your doctor or health care professional as soon as possible: -allergic reactions like skin rash, itching or hives, swelling of the face, lips, or tongue -anxiety, confusion, or depression -breathing problems -changes in vision -eye pain -feeling faint or  lightheaded, falls -jaw pain, especially after dental work -mouth sores -muscle cramps, stiffness, or weakness -redness, blistering, peeling or loosening of the skin, including inside the mouth -trouble passing urine or change in the amount of urine Side effects that usually do not require medical attention (report to your doctor or health care professional if they continue or are bothersome): -bone, joint, or muscle pain -constipation -diarrhea -fever -hair loss -irritation at site where injected -loss of appetite -nausea, vomiting -stomach upset -trouble sleeping -trouble swallowing -weak or tired This list may not describe all possible side effects. Call your doctor for medical advice about side effects. You may report side effects to FDA at 1-800-FDA-1088. Where should I keep my medicine? This drug is given in a hospital or clinic and will not be stored at home. NOTE: This sheet is a summary. It may not cover all possible information. If you have questions about this medicine, talk to your doctor, pharmacist, or health care provider.  2018 Elsevier/Gold Standard (2014-02-08 14:19:39)  Leuprolide injection What is this medicine? LEUPROLIDE (loo PROE lide) is a man-made hormone. It is used to treat the symptoms of prostate cancer. This medicine may also be used to treat children with early onset of puberty. It may be used for other hormonal conditions. This medicine may be used for other purposes; ask your health care provider or pharmacist if you have questions. COMMON BRAND NAME(S): Lupron What should I tell my health care provider before I take this medicine? They need to know if you have any of these conditions: -diabetes -heart disease or previous heart attack -high blood pressure -high cholesterol -pain or difficulty passing urine -spinal cord metastasis -stroke -tobacco smoker -an unusual or allergic reaction to leuprolide, benzyl alcohol, other medicines, foods,  dyes, or preservatives -pregnant or trying to get pregnant -breast-feeding How should I use this medicine? This medicine is for injection under the skin or into a muscle. You will be taught how to prepare and give this medicine. Use exactly as directed. Take your medicine at regular intervals. Do not take your medicine more often than directed. It is important that you put your used needles and syringes in a special sharps container. Do not put them in a trash can. If you do not have a sharps container, call your pharmacist or healthcare provider to get one. A special MedGuide will be given to you by the pharmacist with each prescription and refill. Be sure to read this information carefully each time. Talk to your pediatrician regarding the use of this medicine in children. While this medicine may be prescribed for children as young as 8 years for selected conditions, precautions do apply. Overdosage: If you think you have taken too much of this medicine contact a poison control center or emergency room at once. NOTE: This medicine is only for you. Do not share this medicine with others. What if I miss a dose? If you miss a dose, take it as soon as you can. If it is almost time for your next dose, take only that dose. Do not take double or extra doses. What may interact with this medicine? Do not take this medicine with any of the following medications: -chasteberry This medicine may also interact with   the following medications: -herbal or dietary supplements, like black cohosh or DHEA -male hormones, like estrogens or progestins and birth control pills, patches, rings, or injections -male hormones, like testosterone This list may not describe all possible interactions. Give your health care provider a list of all the medicines, herbs, non-prescription drugs, or dietary supplements you use. Also tell them if you smoke, drink alcohol, or use illegal drugs. Some items may interact with your  medicine. What should I watch for while using this medicine? Visit your doctor or health care professional for regular checks on your progress. During the first week, your symptoms may get worse, but then will improve as you continue your treatment. You may get hot flashes, increased bone pain, increased difficulty passing urine, or an aggravation of nerve symptoms. Discuss these effects with your doctor or health care professional, some of them may improve with continued use of this medicine. Male patients may experience a menstrual cycle or spotting during the first 2 months of therapy with this medicine. If this continues, contact your doctor or health care professional. What side effects may I notice from receiving this medicine? Side effects that you should report to your doctor or health care professional as soon as possible: -allergic reactions like skin rash, itching or hives, swelling of the face, lips, or tongue -breathing problems -chest pain -depression or memory disorders -pain in your legs or groin -pain at site where injected -severe headache -swelling of the feet and legs -visual changes -vomiting Side effects that usually do not require medical attention (report to your doctor or health care professional if they continue or are bothersome): -breast swelling or tenderness -decrease in sex drive or performance -diarrhea -hot flashes -loss of appetite -muscle, joint, or bone pains -nausea -redness or irritation at site where injected -skin problems or acne This list may not describe all possible side effects. Call your doctor for medical advice about side effects. You may report side effects to FDA at 1-800-FDA-1088. Where should I keep my medicine? Keep out of the reach of children. Store below 25 degrees C (77 degrees F). Do not freeze. Protect from light. Do not use if it is not clear or if there are particles present. Throw away any unused medicine after the  expiration date. NOTE: This sheet is a summary. It may not cover all possible information. If you have questions about this medicine, talk to your doctor, pharmacist, or health care provider.  2018 Elsevier/Gold Standard (2016-05-02 10:54:35)  

## 2017-08-08 NOTE — Progress Notes (Signed)
  [facility] OFFICE PROGRESS NOTE   Diagnosis: Prostate cancer  INTERVAL HISTORY:   Mr. Zapf returns as scheduled.  He feels well.  He continues abiraterone/prednisone.  No complaint.  He was placed on a diuretic by his primary physician.  Objective:  Vital signs in last 24 hours:  Blood pressure (!) 148/91, pulse 98, temperature 97.8 F (36.6 C), temperature source Oral, resp. rate 18, height 5\' 5"  (1.651 m), weight 160 lb 12.8 oz (72.9 kg), SpO2 97 %.   Resp: Inspiratory rales at the right base, no respiratory distress Cardio: Regular rate and rhythm GI: No hepatosplenomegaly Vascular: The left lower leg is slightly larger than the right side, no edema    Lab Results:  Lab Results  Component Value Date   WBC 9.1 08/08/2017   HGB 12.6 (L) 08/08/2017   HCT 38.3 (L) 08/08/2017   MCV 91.0 08/08/2017   PLT 310 08/08/2017   NEUTROABS 5.7 08/08/2017    CMP     Component Value Date/Time   NA 140 08/08/2017 1055   K 3.1 (L) 08/08/2017 1055   CL 100 (L) 03/05/2016 0454   CO2 28 08/08/2017 1055   GLUCOSE 177 (H) 08/08/2017 1055   BUN 21.6 08/08/2017 1055   CREATININE 0.8 08/08/2017 1055   CALCIUM 9.9 08/08/2017 1055   PROT 7.2 08/08/2017 1055   ALBUMIN 3.6 08/08/2017 1055   AST 16 08/08/2017 1055   ALT 22 08/08/2017 1055   ALKPHOS 68 08/08/2017 1055   BILITOT 0.43 08/08/2017 1055   GFRNONAA >60 03/05/2016 0454   GFRAA >60 03/05/2016 0454     Medications: I have reviewed the patient's current medications.  Assessment/Plan: 1. History of Severe anemia-status post a red cell transfusion 03/04/2016 2.. Prostate cancer  Extensive sclerotic bone metastases noted on a CT of the abdomen/pelvis 03/04/2016 and MRI abdomen 03/05/2016  Markedly elevated PSA  Lupron initiated 03/15/2016; Casodex 14 days beginning 03/15/2016  Prostate biopsy 03/18/2016  05/09/2016 PSA significantly improved at 21  Abirateroneinitiated08/15/2017  3. Symptoms  of obstructive uropathy. Improved .  4. 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 Anorexia/weight loss-Improved  6. Undescended testicles  7. Right renal mass.   8. Diabetes   Disposition: Mr. Lycan appears stable.  He will continue abiraterone/prednisone.  He will receive Lupron/Zometa today.  He will return for an office visit and Lupron/submitted in 3 months.  We will forward the chemistry panel to his primary physician for management of the hypokalemia.  This is likely related to diuretic therapy.  We will refer him for a follow-up CT to evaluate the renal and colon masses.      [name], MD  08/08/2017  12:34 PM

## 2017-08-09 LAB — PSA: Prostate Specific Ag, Serum: <0.1 ng/mL (ref 0.0–4.0)

## 2017-08-22 ENCOUNTER — Other Ambulatory Visit: Payer: Self-pay | Admitting: Oncology

## 2017-08-22 MED FILL — predniSONE 5 MG TABS: 5 | 90 days supply | Qty: 180 | Fill #0

## 2017-08-29 ENCOUNTER — Other Ambulatory Visit: Payer: Self-pay | Admitting: *Deleted

## 2017-08-29 ENCOUNTER — Ambulatory Visit (HOSPITAL_COMMUNITY)
Admission: RE | Admit: 2017-08-29 | Discharge: 2017-08-29 | Disposition: A | Discharge status: Home / Self Care | Payer: Self-pay | Source: Ambulatory Visit | Attending: Oncology | Admitting: Oncology

## 2017-08-29 ENCOUNTER — Encounter (HOSPITAL_COMMUNITY): Payer: Self-pay

## 2017-08-29 ENCOUNTER — Other Ambulatory Visit: Payer: Self-pay | Admitting: Oncology

## 2017-08-29 DIAGNOSIS — C61 Malignant neoplasm of prostate: Secondary | ICD-10-CM | POA: Insufficient documentation

## 2017-08-29 DIAGNOSIS — R590 Localized enlarged lymph nodes: Secondary | ICD-10-CM | POA: Insufficient documentation

## 2017-08-29 DIAGNOSIS — N289 Disorder of kidney and ureter, unspecified: Secondary | ICD-10-CM | POA: Insufficient documentation

## 2017-08-29 MED ORDER — IOPAMIDOL (ISOVUE-300) INJECTION 61%
INTRAVENOUS | Status: AC
  Administered 2017-08-29: 100 mL via INTRAVENOUS
  Filled 2017-08-29: qty 100

## 2017-08-29 MED ORDER — IOPAMIDOL (ISOVUE-300) INJECTION 61%
100.0000 mL | Freq: Once | INTRAVENOUS | Status: AC | PRN
Start: 2017-08-29 — End: 2017-08-29
  Administered 2017-08-29: 100 mL via INTRAVENOUS

## 2017-08-29 MED ORDER — ABIRATERONE ACETATE 250 MG PO TABS: 1000.0000 mg | ORAL_TABLET | Freq: Every day | ORAL | 2 refills | Status: DC

## 2017-08-29 NOTE — Telephone Encounter (Signed)
Voicemail from pt requesting Zytiga refill to be sent to [facility]. Same done.

## 2017-08-30 ENCOUNTER — Telehealth: Payer: Self-pay | Admitting: *Deleted

## 2017-08-30 NOTE — Telephone Encounter (Signed)
-----   Message from Ladell Pier, MD sent at 08/29/2017  6:11 PM EST ----- Please call patient, CT shows improved pelvic lymph nodes, bone lesions easier to see likely due to scarring Has persistent kidney mass that may be a cancer, will refer to urology Colon appears stable, needs repeat colonoscopy with Dr. Benson Norway

## 2017-08-30 NOTE — Telephone Encounter (Signed)
Called pt with CT result, per MD note below. Informed him of referral to urology for persistent kidney mass and GI referral for repeat colonoscopy. Pt voiced concern that he doesn't have insurance. Stated he "will work something out[employer], pt scheduled with Dr. Pilar Jarvis on 09/06/17 @ 0815. Called Dr. Ulyses Amor office, they gave appt for same day. Pt will call to reschedule the GI appt.

## 2017-10-05 ENCOUNTER — Encounter: Payer: Self-pay | Admitting: Internal Medicine

## 2017-11-07 ENCOUNTER — Inpatient Hospital Stay: Payer: Self-pay

## 2017-11-07 ENCOUNTER — Inpatient Hospital Stay: Payer: Self-pay | Attending: Nurse Practitioner | Admitting: Nurse Practitioner

## 2017-11-07 ENCOUNTER — Telehealth: Payer: Self-pay | Admitting: Nurse Practitioner

## 2017-11-07 ENCOUNTER — Encounter: Payer: Self-pay | Admitting: Nurse Practitioner

## 2017-11-07 VITALS — BP 158/90 | HR 92 | Temp 98.0°F | Resp 18 | Ht 65.0 in | Wt 166.1 lb

## 2017-11-07 DIAGNOSIS — C61 Malignant neoplasm of prostate: Secondary | ICD-10-CM

## 2017-11-07 DIAGNOSIS — C7951 Secondary malignant neoplasm of bone: Secondary | ICD-10-CM | POA: Insufficient documentation

## 2017-11-07 DIAGNOSIS — E119 Type 2 diabetes mellitus without complications: Secondary | ICD-10-CM

## 2017-11-07 DIAGNOSIS — Z5111 Encounter for antineoplastic chemotherapy: Secondary | ICD-10-CM | POA: Insufficient documentation

## 2017-11-07 DIAGNOSIS — N2889 Other specified disorders of kidney and ureter: Secondary | ICD-10-CM

## 2017-11-07 DIAGNOSIS — K639 Disease of intestine, unspecified: Secondary | ICD-10-CM

## 2017-11-07 DIAGNOSIS — Q539 Undescended testicle, unspecified: Secondary | ICD-10-CM

## 2017-11-07 LAB — CBC WITH DIFFERENTIAL/PLATELET
BASOS PCT: 1 %
Basophils Absolute: 0.1 10*3/uL (ref 0.0–0.1)
Eosinophils Absolute: 0 10*3/uL (ref 0.0–0.5)
Eosinophils Relative: 0 %
HEMATOCRIT: 35.8 % — AB (ref 38.4–49.9)
HEMOGLOBIN: 11.6 g/dL — AB (ref 13.0–17.1)
Lymphocytes Relative: 13 %
Lymphs Abs: 1.1 10*3/uL (ref 0.9–3.3)
MCH: 29.7 pg (ref 27.2–33.4)
MCHC: 32.5 g/dL (ref 32.0–36.0)
MCV: 91.4 fL (ref 79.3–98.0)
Monocytes Absolute: 0.5 10*3/uL (ref 0.1–0.9)
Monocytes Relative: 6 %
NEUTROS ABS: 6.6 10*3/uL — AB (ref 1.5–6.5)
NEUTROS PCT: 80 %
Platelets: 323 10*3/uL (ref 140–400)
RBC: 3.92 MIL/uL — ABNORMAL LOW (ref 4.20–5.82)
RDW: 14.2 % (ref 11.0–14.6)
WBC: 8.3 10*3/uL (ref 4.0–10.3)

## 2017-11-07 LAB — COMPREHENSIVE METABOLIC PANEL
ALK PHOS: 71 U/L (ref 40–150)
ALT: 16 U/L (ref 0–55)
ANION GAP: 13 — AB (ref 3–11)
AST: 11 U/L (ref 5–34)
Albumin: 3.4 g/dL — ABNORMAL LOW (ref 3.5–5.0)
BILIRUBIN TOTAL: 0.3 mg/dL (ref 0.2–1.2)
BUN: 22 mg/dL (ref 7–26)
CALCIUM: 9.8 mg/dL (ref 8.4–10.4)
CO2: 24 mmol/L (ref 22–29)
Chloride: 102 mmol/L (ref 98–109)
Creatinine, Ser: 0.86 mg/dL (ref 0.70–1.30)
GFR calc non Af Amer: >60 mL/min (ref >60)
Glucose, Bld: 198 mg/dL — ABNORMAL HIGH (ref 70–140)
POTASSIUM: 3.7 mmol/L (ref 3.5–5.1)
SODIUM: 139 mmol/L (ref 136–145)
TOTAL PROTEIN: 6.8 g/dL (ref 6.4–8.3)

## 2017-11-07 MED ORDER — LEUPROLIDE ACETATE (3 MONTH) 22.5 MG IM KIT
PACK | INTRAMUSCULAR | Status: AC
  Filled 2017-11-07: qty 22.5

## 2017-11-07 MED ORDER — SODIUM CHLORIDE 0.9 % IV SOLN
4.0000 mg | Freq: Once | INTRAVENOUS | Status: AC
  Administered 2017-11-07: 4 mg via INTRAVENOUS
  Filled 2017-11-07: qty 5

## 2017-11-07 MED ORDER — ZOLEDRONIC ACID 4 MG/100ML IV SOLN: 4.0000 mg | Freq: Once | INTRAVENOUS | Status: DC

## 2017-11-07 MED ORDER — LEUPROLIDE ACETATE (3 MONTH) 22.5 MG IM KIT
22.5000 mg | PACK | Freq: Once | INTRAMUSCULAR | Status: AC
  Administered 2017-11-07: 22.5 mg via INTRAMUSCULAR

## 2017-11-07 MED ORDER — SODIUM CHLORIDE 0.9 % IV SOLN
Freq: Once | INTRAVENOUS | Status: AC
  Administered 2017-11-07: 14:00:00 via INTRAVENOUS

## 2017-11-07 NOTE — Progress Notes (Addendum)
  Brandon Peters OFFICE PROGRESS NOTE  Diagnosis: Prostate cancer  INTERVAL HISTORY:   [name] returns as scheduled.  He continues abiraterone/prednisone.  He denies nausea/vomiting.  No diarrhea or constipation.  No bleeding.  No reflux symptoms.  He continues to have hot flashes.  Objective:  Vital signs in last 24 hours:  Blood pressure (!) 158/90, pulse 92, temperature 98 F (36.7 C), temperature source Oral, resp. rate 18, height 5\' 5"  (1.651 m), weight 166 lb 1.6 oz (75.3 kg), SpO2 99 %.    HEENT: No thrush or ulcers. Resp: Lungs clear bilaterally. Cardio: Regular rate and rhythm. GI: Abdomen soft and nontender.  No hepatomegaly. Vascular: Trace edema at the lower legs bilaterally.    Lab Results:  Lab Results  Component Value Date   WBC 8.3 11/07/2017   HGB 11.6 (L) 11/07/2017   HCT 35.8 (L) 11/07/2017   MCV 91.4 11/07/2017   PLT 323 11/07/2017   NEUTROABS 6.6 (H) 11/07/2017    Imaging:  No results found.  Medications: I have reviewed the patient's current medications.  Assessment/Plan: 1. History of Severe anemia-status post a red cell transfusion 03/04/2016 2.. Prostate cancer  Extensive sclerotic bone metastases noted on a CT of the abdomen/pelvis 03/04/2016 and MRI abdomen 03/05/2016  Markedly elevated PSA  Lupron initiated 03/15/2016; Casodex 14 days beginning 03/15/2016  Prostate biopsy 03/18/2016  05/09/2016 PSA significantly improved at 21  Abirateroneinitiated08/15/2017  3. Symptoms of obstructive uropathy. Improved .  4. Distal duodenal and descending colon masses  biopsy of the duodenal mass 03/04/2016 confirmed fragments of small bowel mucosa with focal high-grade dysplasia  Biopsy of the descending colon "polyps" revealed fragments of a tubulovillous adenoma  CT 08/29/2017-similar soft tissue thickening in the region of the splenic flexure of the colon suboptimally evaluated due to lack of colonic enteric  contrast  Referred to Dr. Benson Norway  Referred to [facility]  5. Anorexia/weight loss-Improved  6. Undescended testicles  7. Right renal mass.  CT 08/29/2017-right renal mass involving the lower pole was partially calcified and does demonstrate enhancement following contrast most consistent with renal cell carcinoma.  Lesion not significantly enlarged compared with the prior CT. Followed by urology.  8. Diabetes    Disposition: Brandon Peters appears stable.  We will follow-up on the PSA from from today.  He will continue abiraterone/prednisone.  He will receive Lupron today.  He has been receiving patient assistance for the Zometa infusion.  This program has been discontinued.  We will hold Zometa today and follow-up with the [place] regarding other options.  He will follow-up with urology regarding the renal mass.  He has been referred to [facility] for follow-up of the colon masses.  He will return for lab, follow-up and Lupron in 3 months.  He will contact the office in the interim with any problems.  Patient seen with Brandon Peters.  Brandon Peters ANP/GNP-BC   11/07/2017  1:18 PM  This was a shared visit with Brandon Peters.  Brandon Peters appears unchanged.  No clinical evidence for progression of prostate cancer.  He will continue abiraterone/prednisone and every 65-month Lupron.  We will check options for biphosphonate therapy with the Cancer center pharmacy  Brandon Manson, MD

## 2017-11-07 NOTE — Patient Instructions (Signed)

## 2017-11-07 NOTE — Telephone Encounter (Signed)
Scheduled appt per 2/12 los - patient Is aware of appt date and time - no print out wanted - my chart active.

## 2017-11-07 NOTE — Patient Instructions (Signed)

## 2017-11-08 LAB — PROSTATE-SPECIFIC AG, SERUM (LABCORP): Prostate Specific Ag, Serum: <0.1 ng/mL (ref 0.0–4.0)

## 2017-11-15 ENCOUNTER — Other Ambulatory Visit: Payer: Self-pay

## 2017-11-15 DIAGNOSIS — C61 Malignant neoplasm of prostate: Secondary | ICD-10-CM

## 2017-11-27 MED FILL — predniSONE 5 MG TABS: 5 | 90 days supply | Qty: 180 | Fill #1

## 2017-12-29 ENCOUNTER — Other Ambulatory Visit: Payer: Self-pay | Admitting: *Deleted

## 2017-12-29 DIAGNOSIS — C61 Malignant neoplasm of prostate: Secondary | ICD-10-CM

## 2017-12-29 MED ORDER — ABIRATERONE ACETATE 250 MG PO TABS: 1000.0000 mg | ORAL_TABLET | Freq: Every day | ORAL | 2 refills | Status: DC

## 2018-02-13 ENCOUNTER — Telehealth: Payer: Self-pay

## 2018-02-13 ENCOUNTER — Inpatient Hospital Stay: Payer: Self-pay

## 2018-02-13 ENCOUNTER — Inpatient Hospital Stay: Payer: Self-pay | Attending: Nurse Practitioner | Admitting: Oncology

## 2018-02-13 ENCOUNTER — Ambulatory Visit: Payer: Self-pay

## 2018-02-13 VITALS — BP 153/95 | HR 96 | Temp 97.7°F | Resp 19 | Ht 65.0 in | Wt 159.9 lb

## 2018-02-13 DIAGNOSIS — C61 Malignant neoplasm of prostate: Secondary | ICD-10-CM

## 2018-02-13 DIAGNOSIS — N2889 Other specified disorders of kidney and ureter: Secondary | ICD-10-CM

## 2018-02-13 DIAGNOSIS — Z5111 Encounter for antineoplastic chemotherapy: Secondary | ICD-10-CM | POA: Insufficient documentation

## 2018-02-13 DIAGNOSIS — Q539 Undescended testicle, unspecified: Secondary | ICD-10-CM

## 2018-02-13 DIAGNOSIS — C7951 Secondary malignant neoplasm of bone: Secondary | ICD-10-CM | POA: Insufficient documentation

## 2018-02-13 DIAGNOSIS — E119 Type 2 diabetes mellitus without complications: Secondary | ICD-10-CM

## 2018-02-13 LAB — CBC WITH DIFFERENTIAL (CANCER CENTER ONLY)
Basophils Absolute: 0 K/uL (ref 0.0–0.1)
Basophils Relative: 0 %
Eosinophils Absolute: 0 K/uL (ref 0.0–0.5)
Eosinophils Relative: 0 %
HCT: 36.4 % — ABNORMAL LOW (ref 38.4–49.9)
Hemoglobin: 11.6 g/dL — ABNORMAL LOW (ref 13.0–17.1)
Lymphocytes Relative: 12 %
Lymphs Abs: 1.4 K/uL (ref 0.9–3.3)
MCH: 29.6 pg (ref 27.2–33.4)
MCHC: 31.9 g/dL — ABNORMAL LOW (ref 32.0–36.0)
MCV: 92.9 fL (ref 79.3–98.0)
Monocytes Absolute: 0.8 K/uL (ref 0.1–0.9)
Monocytes Relative: 7 %
Neutro Abs: 8.9 K/uL — ABNORMAL HIGH (ref 1.5–6.5)
Neutrophils Relative %: 81 %
Platelet Count: 301 K/uL (ref 140–400)
RBC: 3.92 MIL/uL — ABNORMAL LOW (ref 4.20–5.82)
RDW: 13.8 % (ref 11.0–14.6)
WBC Count: 11.1 K/uL — ABNORMAL HIGH (ref 4.0–10.3)

## 2018-02-13 LAB — CMP (CANCER CENTER ONLY)
ALT: 16 U/L (ref 0–55)
AST: 11 U/L (ref 5–34)
Albumin: 3.6 g/dL (ref 3.5–5.0)
Alkaline Phosphatase: 75 U/L (ref 40–150)
Anion gap: 13 — ABNORMAL HIGH (ref 3–11)
BUN: 20 mg/dL (ref 7–26)
CHLORIDE: 100 mmol/L (ref 98–109)
CO2: 26 mmol/L (ref 22–29)
Calcium: 9.6 mg/dL (ref 8.4–10.4)
Creatinine: 0.84 mg/dL (ref 0.70–1.30)
GFR, Est AFR Am: >60 mL/min (ref >60)
GLUCOSE: 198 mg/dL — AB (ref 70–140)
Potassium: 3.2 mmol/L — ABNORMAL LOW (ref 3.5–5.1)
Sodium: 139 mmol/L (ref 136–145)
Total Bilirubin: 0.3 mg/dL (ref 0.2–1.2)
Total Protein: 7.1 g/dL (ref 6.4–8.3)

## 2018-02-13 MED ORDER — LEUPROLIDE ACETATE (3 MONTH) 22.5 MG IM KIT
22.5000 mg | PACK | Freq: Once | INTRAMUSCULAR | Status: AC
  Administered 2018-02-13: 22.5 mg via INTRAMUSCULAR

## 2018-02-13 MED ORDER — SODIUM CHLORIDE 0.9 % IV SOLN
Freq: Once | INTRAVENOUS | Status: AC
  Administered 2018-02-13: 13:00:00 via INTRAVENOUS

## 2018-02-13 MED ORDER — ZOLEDRONIC ACID 4 MG/5ML IV CONC
4.0000 mg | Freq: Once | INTRAVENOUS | Status: AC
  Administered 2018-02-13: 4 mg via INTRAVENOUS
  Filled 2018-02-13: qty 5

## 2018-02-13 NOTE — Patient Instructions (Signed)
Zoledronic Acid injection (Hypercalcemia, Oncology) What is this medicine? ZOLEDRONIC ACID (ZOE le dron ik AS id) lowers the amount of calcium loss from bone. It is used to treat too much calcium in your blood from cancer. It is also used to prevent complications of cancer that has spread to the bone. This medicine may be used for other purposes; ask your health care provider or pharmacist if you have questions. COMMON BRAND NAME(S): Zometa What should I tell my health care provider before I take this medicine? They need to know if you have any of these conditions: -aspirin-sensitive asthma -cancer, especially if you are receiving medicines used to treat cancer -dental disease or wear dentures -infection -kidney disease -receiving corticosteroids like dexamethasone or prednisone -an unusual or allergic reaction to zoledronic acid, other medicines, foods, dyes, or preservatives -pregnant or trying to get pregnant -breast-feeding How should I use this medicine? This medicine is for infusion into a vein. It is given by a health care professional in a hospital or clinic setting. Talk to your pediatrician regarding the use of this medicine in children. Special care may be needed. Overdosage: If you think you have taken too much of this medicine contact a poison control center or emergency room at once. NOTE: This medicine is only for you. Do not share this medicine with others. What if I miss a dose? It is important not to miss your dose. Call your doctor or health care professional if you are unable to keep an appointment. What may interact with this medicine? -certain antibiotics given by injection -NSAIDs, medicines for pain and inflammation, like ibuprofen or naproxen -some diuretics like bumetanide, furosemide -teriparatide -thalidomide This list may not describe all possible interactions. Give your health care provider a list of all the medicines, herbs, non-prescription drugs, or  dietary supplements you use. Also tell them if you smoke, drink alcohol, or use illegal drugs. Some items may interact with your medicine. What should I watch for while using this medicine? Visit your doctor or health care professional for regular checkups. It may be some time before you see the benefit from this medicine. Do not stop taking your medicine unless your doctor tells you to. Your doctor may order blood tests or other tests to see how you are doing. Women should inform their doctor if they wish to become pregnant or think they might be pregnant. There is a potential for serious side effects to an unborn child. Talk to your health care professional or pharmacist for more information. You should make sure that you get enough calcium and vitamin D while you are taking this medicine. Discuss the foods you eat and the vitamins you take with your health care professional. Some people who take this medicine have severe bone, joint, and/or muscle pain. This medicine may also increase your risk for jaw problems or a broken thigh bone. Tell your doctor right away if you have severe pain in your jaw, bones, joints, or muscles. Tell your doctor if you have any pain that does not go away or that gets worse. Tell your dentist and dental surgeon that you are taking this medicine. You should not have major dental surgery while on this medicine. See your dentist to have a dental exam and fix any dental problems before starting this medicine. Take good care of your teeth while on this medicine. Make sure you see your dentist for regular follow-up appointments. What side effects may I notice from receiving this medicine? Side effects that   you should report to your doctor or health care professional as soon as possible: -allergic reactions like skin rash, itching or hives, swelling of the face, lips, or tongue -anxiety, confusion, or depression -breathing problems -changes in vision -eye pain -feeling faint or  lightheaded, falls -jaw pain, especially after dental work -mouth sores -muscle cramps, stiffness, or weakness -redness, blistering, peeling or loosening of the skin, including inside the mouth -trouble passing urine or change in the amount of urine Side effects that usually do not require medical attention (report to your doctor or health care professional if they continue or are bothersome): -bone, joint, or muscle pain -constipation -diarrhea -fever -hair loss -irritation at site where injected -loss of appetite -nausea, vomiting -stomach upset -trouble sleeping -trouble swallowing -weak or tired This list may not describe all possible side effects. Call your doctor for medical advice about side effects. You may report side effects to FDA at 1-800-FDA-1088. Where should I keep my medicine? This drug is given in a hospital or clinic and will not be stored at home. NOTE: This sheet is a summary. It may not cover all possible information. If you have questions about this medicine, talk to your doctor, pharmacist, or health care provider.  2018 Elsevier/Gold Standard (2014-02-08 14:19:39)  Leuprolide injection What is this medicine? LEUPROLIDE (loo PROE lide) is a man-made hormone. It is used to treat the symptoms of prostate cancer. This medicine may also be used to treat children with early onset of puberty. It may be used for other hormonal conditions. This medicine may be used for other purposes; ask your health care provider or pharmacist if you have questions. COMMON BRAND NAME(S): Lupron What should I tell my health care provider before I take this medicine? They need to know if you have any of these conditions: -diabetes -heart disease or previous heart attack -high blood pressure -high cholesterol -pain or difficulty passing urine -spinal cord metastasis -stroke -tobacco smoker -an unusual or allergic reaction to leuprolide, benzyl alcohol, other medicines, foods,  dyes, or preservatives -pregnant or trying to get pregnant -breast-feeding How should I use this medicine? This medicine is for injection under the skin or into a muscle. You will be taught how to prepare and give this medicine. Use exactly as directed. Take your medicine at regular intervals. Do not take your medicine more often than directed. It is important that you put your used needles and syringes in a special sharps container. Do not put them in a trash can. If you do not have a sharps container, call your pharmacist or healthcare provider to get one. A special MedGuide will be given to you by the pharmacist with each prescription and refill. Be sure to read this information carefully each time. Talk to your pediatrician regarding the use of this medicine in children. While this medicine may be prescribed for children as young as 8 years for selected conditions, precautions do apply. Overdosage: If you think you have taken too much of this medicine contact a poison control center or emergency room at once. NOTE: This medicine is only for you. Do not share this medicine with others. What if I miss a dose? If you miss a dose, take it as soon as you can. If it is almost time for your next dose, take only that dose. Do not take double or extra doses. What may interact with this medicine? Do not take this medicine with any of the following medications: -chasteberry This medicine may also interact with   the following medications: -herbal or dietary supplements, like black cohosh or DHEA -male hormones, like estrogens or progestins and birth control pills, patches, rings, or injections -male hormones, like testosterone This list may not describe all possible interactions. Give your health care provider a list of all the medicines, herbs, non-prescription drugs, or dietary supplements you use. Also tell them if you smoke, drink alcohol, or use illegal drugs. Some items may interact with your  medicine. What should I watch for while using this medicine? Visit your doctor or health care professional for regular checks on your progress. During the first week, your symptoms may get worse, but then will improve as you continue your treatment. You may get hot flashes, increased bone pain, increased difficulty passing urine, or an aggravation of nerve symptoms. Discuss these effects with your doctor or health care professional, some of them may improve with continued use of this medicine. Male patients may experience a menstrual cycle or spotting during the first 2 months of therapy with this medicine. If this continues, contact your doctor or health care professional. What side effects may I notice from receiving this medicine? Side effects that you should report to your doctor or health care professional as soon as possible: -allergic reactions like skin rash, itching or hives, swelling of the face, lips, or tongue -breathing problems -chest pain -depression or memory disorders -pain in your legs or groin -pain at site where injected -severe headache -swelling of the feet and legs -visual changes -vomiting Side effects that usually do not require medical attention (report to your doctor or health care professional if they continue or are bothersome): -breast swelling or tenderness -decrease in sex drive or performance -diarrhea -hot flashes -loss of appetite -muscle, joint, or bone pains -nausea -redness or irritation at site where injected -skin problems or acne This list may not describe all possible side effects. Call your doctor for medical advice about side effects. You may report side effects to FDA at 1-800-FDA-1088. Where should I keep my medicine? Keep out of the reach of children. Store below 25 degrees C (77 degrees F). Do not freeze. Protect from light. Do not use if it is not clear or if there are particles present. Throw away any unused medicine after the  expiration date. NOTE: This sheet is a summary. It may not cover all possible information. If you have questions about this medicine, talk to your doctor, pharmacist, or health care provider.  2018 Elsevier/Gold Standard (2016-05-02 10:54:35)  

## 2018-02-13 NOTE — Telephone Encounter (Signed)
Printed avs and calender of upcoming appointment.  Per 5/21 los 

## 2018-02-13 NOTE — Progress Notes (Signed)
[place] OFFICE PROGRESS NOTE   Diagnosis: Prostate cancer  INTERVAL HISTORY:   Brandon Peters returns for a scheduled visit.  He continues abiraterone and prednisone.  He last received Zometa and Lupron 3 months ago.  No tooth or jaw pain. He feels well. He underwent removal of colon polyps at [facility] 01/31/2018.  The pathology revealed fragments of a tubular villous adenoma with focal high-grade dysplasia.  No invasive carcinoma. He is scheduled for resection of a jejunal polyp in August.  Objective:  Vital signs in last 24 hours:  Blood pressure (!) 153/95, pulse 96, temperature 97.7 F (36.5 C), temperature source Oral, resp. rate 19, height 5\' 5"  (1.651 m), weight 159 lb 14.4 oz (72.5 kg), SpO2 98 %.    Resp: Lungs clear, no respiratory distress Cardio: Regular rate and rhythm GI: No hepatomegaly, nontender Vascular: No leg edema     Lab Results:  Lab Results  Component Value Date   WBC 11.1 (H) 02/13/2018   HGB 11.6 (L) 02/13/2018   HCT 36.4 (L) 02/13/2018   MCV 92.9 02/13/2018   PLT 301 02/13/2018   NEUTROABS 8.9 (H) 02/13/2018    CMP     Component Value Date/Time   NA 139 02/13/2018 1131   NA 140 08/08/2017 1055   K 3.2 (L) 02/13/2018 1131   K 3.1 (L) 08/08/2017 1055   CL 100 02/13/2018 1131   CO2 26 02/13/2018 1131   CO2 28 08/08/2017 1055   GLUCOSE 198 (H) 02/13/2018 1131   GLUCOSE 177 (H) 08/08/2017 1055   BUN 20 02/13/2018 1131   BUN 21.6 08/08/2017 1055   CREATININE 0.84 02/13/2018 1131   CREATININE 0.8 08/08/2017 1055   CALCIUM 9.6 02/13/2018 1131   CALCIUM 9.9 08/08/2017 1055   PROT 7.1 02/13/2018 1131   PROT 7.2 08/08/2017 1055   ALBUMIN 3.6 02/13/2018 1131   ALBUMIN 3.6 08/08/2017 1055   AST 11 02/13/2018 1131   AST 16 08/08/2017 1055   ALT 16 02/13/2018 1131   ALT 22 08/08/2017 1055   ALKPHOS 75 02/13/2018 1131   ALKPHOS 68 08/08/2017 1055   BILITOT 0.3 02/13/2018 1131   BILITOT 0.43 08/08/2017 1055   GFRNONAA >60  02/13/2018 1131   GFRAA >60 02/13/2018 1131    11/07/2017: PSA-less than 0.1 Medications: I have reviewed the patient's current medications.   Assessment/Plan: 1. History of Severe anemia-status post a red cell transfusion 03/04/2016 2.. Prostate cancer  Extensive sclerotic bone metastases noted on a CT of the abdomen/pelvis 03/04/2016 and MRI abdomen 03/05/2016  Markedly elevated PSA  Lupron initiated 03/15/2016; Casodex 14 days beginning 03/15/2016  Prostate biopsy 03/18/2016  05/09/2016 PSA significantly improved at 21  Abirateroneinitiated08/15/2017  3. Symptoms of obstructive uropathy. Improved .  4. Distal duodenal and descending colon masses  biopsy of the duodenal mass 03/04/2016 confirmed fragments of small bowel mucosa with focal high-grade dysplasia  Biopsy of the descending colon "polyps" revealed fragments of a tubulovillous adenoma  CT 08/29/2017-similar soft tissue thickening in the region of the splenic flexure of the colon suboptimally evaluated due to lack of colonic enteric contrast  Referred to Dr. Benson Norway  Referred to [facility]  Resection of descending colon polyps 01/31/2018, tubulovillous adenomas with high-grade dysplasia  5. Anorexia/weight loss-Improved  6. Undescended testicles  7. Right renal mass.  CT 08/29/2017-right renal mass involving the lower pole was partially calcified and does demonstrate enhancement following contrast most consistent with renal cell carcinoma.  Lesion not significantly enlarged compared with the  prior CT. Followed by urology.  8. Diabetes  Disposition: [name] appears well.  There is no evidence for progression of prostate cancer.  He will continue abiraterone/prednisone.  He continues every 78-month Lupron and Zometa.  He is followed at [facility] after resection of descending colon polyps.  He is scheduled for resection of a jejunal polyp in August.  He has an appointment with urology to follow-up on the  renal mass.  The potassium is low, likely secondary to HCTZ and prednisone.  He will increase the potassium supplement to 20 mEq daily.  [name] will return for an office and lab visit in 3 months.  [name], MD  02/13/2018  12:28 PM

## 2018-02-14 LAB — PROSTATE-SPECIFIC AG, SERUM (LABCORP)

## 2018-02-21 ENCOUNTER — Other Ambulatory Visit: Payer: Self-pay | Admitting: Oncology

## 2018-02-22 MED FILL — predniSONE 5 MG TABS: 5 | 90 days supply | Qty: 180 | Fill #0

## 2018-03-22 ENCOUNTER — Telehealth: Payer: Self-pay | Admitting: Pharmacist

## 2018-03-22 NOTE — Telephone Encounter (Signed)
Oral Oncology Pharmacist Encounter  Completed application for [place] ([employer]) for continued support for [facility] faxed to [phone].  Patient has no prescription insurance coverage. He has been receiving Zytiga at $0 out of pocket cost to him through [employer] for the past 2 years.  Current enrollment ends on 7/25/219.  This encounter will continue to be updated until final determination.  Johny Drilling, PharmD, BCPS, BCOP  03/22/2018 9:31 AM Oral Oncology Clinic [phone]

## 2018-04-03 NOTE — Telephone Encounter (Signed)
Oral Oncology Pharmacist Encounter  Received notification from [facility] patient assistance foundation that patient has been successfully enrolled to receive Zytiga at no out-of-pocket cost to patient for another year.  Current enrollment ends on 04/03/2019  I called and informed patient of good news. He should receive a letter in the mail from [employer] with his approval information.  Patient understands that we will have to re-enroll for continued support from [place] next year if he remains on [place].  All questions answered. Patient expressed understanding and appreciation. Patient knows to call the office with any additional questions or concerns.  Johny Drilling, PharmD, BCPS, BCOP  04/03/2018 8:39 AM Oral Oncology Clinic [phone]

## 2018-05-15 ENCOUNTER — Inpatient Hospital Stay: Payer: Self-pay

## 2018-05-15 ENCOUNTER — Inpatient Hospital Stay: Payer: Self-pay | Attending: Nurse Practitioner | Admitting: Oncology

## 2018-05-15 VITALS — BP 169/94 | HR 95 | Temp 97.8°F | Resp 18 | Ht 65.0 in | Wt 160.6 lb

## 2018-05-15 DIAGNOSIS — C61 Malignant neoplasm of prostate: Secondary | ICD-10-CM | POA: Insufficient documentation

## 2018-05-15 DIAGNOSIS — C7951 Secondary malignant neoplasm of bone: Secondary | ICD-10-CM | POA: Insufficient documentation

## 2018-05-15 DIAGNOSIS — N289 Disorder of kidney and ureter, unspecified: Secondary | ICD-10-CM

## 2018-05-15 DIAGNOSIS — C170 Malignant neoplasm of duodenum: Secondary | ICD-10-CM

## 2018-05-15 DIAGNOSIS — Q539 Undescended testicle, unspecified: Secondary | ICD-10-CM

## 2018-05-15 DIAGNOSIS — E119 Type 2 diabetes mellitus without complications: Secondary | ICD-10-CM

## 2018-05-15 DIAGNOSIS — Z5111 Encounter for antineoplastic chemotherapy: Secondary | ICD-10-CM | POA: Insufficient documentation

## 2018-05-15 LAB — CMP (CANCER CENTER ONLY)
ALK PHOS: 83 U/L (ref 38–126)
ALT: 22 U/L (ref 0–44)
AST: 12 U/L — ABNORMAL LOW (ref 15–41)
Albumin: 3.6 g/dL (ref 3.5–5.0)
Anion gap: 12 (ref 5–15)
BUN: 24 mg/dL — ABNORMAL HIGH (ref 6–20)
CALCIUM: 9.9 mg/dL (ref 8.9–10.3)
CO2: 28 mmol/L (ref 22–32)
CREATININE: 0.91 mg/dL (ref 0.61–1.24)
Chloride: 99 mmol/L (ref 98–111)
Glucose, Bld: 227 mg/dL — ABNORMAL HIGH (ref 70–99)
Potassium: 3.6 mmol/L (ref 3.5–5.1)
Sodium: 139 mmol/L (ref 135–145)
Total Bilirubin: 0.3 mg/dL (ref 0.3–1.2)
Total Protein: 7.5 g/dL (ref 6.5–8.1)

## 2018-05-15 LAB — CBC WITH DIFFERENTIAL (CANCER CENTER ONLY)
BASOS ABS: 0.1 10*3/uL (ref 0.0–0.1)
BASOS PCT: 1 %
EOS ABS: 0 10*3/uL (ref 0.0–0.5)
Eosinophils Relative: 0 %
HCT: 38.1 % — ABNORMAL LOW (ref 38.4–49.9)
HEMOGLOBIN: 12.2 g/dL — AB (ref 13.0–17.1)
Lymphocytes Relative: 10 %
Lymphs Abs: 1 10*3/uL (ref 0.9–3.3)
MCH: 29.4 pg (ref 27.2–33.4)
MCHC: 32 g/dL (ref 32.0–36.0)
MCV: 91.7 fL (ref 79.3–98.0)
MONO ABS: 0.7 10*3/uL (ref 0.1–0.9)
Monocytes Relative: 7 %
NEUTROS ABS: 8.2 10*3/uL — AB (ref 1.5–6.5)
NEUTROS PCT: 82 %
PLATELETS: 331 10*3/uL (ref 140–400)
RBC: 4.15 MIL/uL — ABNORMAL LOW (ref 4.20–5.82)
RDW: 14.7 % — ABNORMAL HIGH (ref 11.0–14.6)
WBC Count: 10.1 10*3/uL (ref 4.0–10.3)

## 2018-05-15 MED ORDER — LEUPROLIDE ACETATE (3 MONTH) 22.5 MG IM KIT
22.5000 mg | PACK | Freq: Once | INTRAMUSCULAR | Status: AC
  Administered 2018-05-15: 22.5 mg via INTRAMUSCULAR
  Filled 2018-05-15: qty 22.5

## 2018-05-15 MED ORDER — SODIUM CHLORIDE 0.9 % IV SOLN
Freq: Once | INTRAVENOUS | Status: AC
  Administered 2018-05-15: 14:00:00 via INTRAVENOUS
  Filled 2018-05-15: qty 250

## 2018-05-15 MED ORDER — ZOLEDRONIC ACID 4 MG/100ML IV SOLN
4.0000 mg | Freq: Once | INTRAVENOUS | Status: AC
  Administered 2018-05-15: 4 mg via INTRAVENOUS
  Filled 2018-05-15: qty 100

## 2018-05-15 NOTE — Progress Notes (Signed)
[place] OFFICE PROGRESS NOTE   Diagnosis: Prostate cancer, small bowel carcinoma  INTERVAL HISTORY:   [name] returns as scheduled.  He continues abiraterone and prednisone.  He is maintained on every 26-month Lupron and Zometa.  He feels well.  He has occasional abdominal pain.  No other complaint. He underwent attempted resection of the small bowel polyp at [facility] on 05/09/2018.  A 20 mm polyp with bleeding was found in the fourth portion of the duodenum.  The lesion was firm and unresectable.  A biopsy revealed invasive moderately differentiated adenocarcinoma. He has been referred to Dr. Cloyd Stagers to consider resection of the small bowel tumor.  He is scheduled to see Dr. Cloyd Stagers and undergo imaging studies at [facility] on 05/25/2018.  Objective:  Vital signs in last 24 hours:  Blood pressure (!) 169/94, pulse 95, temperature 97.8 F (36.6 C), temperature source Oral, resp. rate 18, height 5\' 5"  (1.651 m), weight 160 lb 9.6 oz (72.8 kg), SpO2 97 %.    HEENT: Neck without mass Lymphatics: No cervical, supraclavicular, axillary, or inguinal nodes Resp: Lungs with inspiratory rhonchi at the right posterior base, no respiratory distress Cardio: Regular rate and rhythm GI: No hepatosplenomegaly, no mass, nontender Vascular: Trace pitting edema at the lower leg bilaterally  Lab Results:  Lab Results  Component Value Date   WBC 10.1 05/15/2018   HGB 12.2 (L) 05/15/2018   HCT 38.1 (L) 05/15/2018   MCV 91.7 05/15/2018   PLT 331 05/15/2018   NEUTROABS 8.2 (H) 05/15/2018    CMP  Lab Results  Component Value Date   NA 139 05/15/2018   K 3.6 05/15/2018   CL 99 05/15/2018   CO2 28 05/15/2018   GLUCOSE 227 (H) 05/15/2018   BUN 24 (H) 05/15/2018   CREATININE 0.91 05/15/2018   CALCIUM 9.9 05/15/2018   PROT 7.5 05/15/2018   ALBUMIN 3.6 05/15/2018   AST 12 (L) 05/15/2018   ALT 22 05/15/2018   ALKPHOS 83 05/15/2018   BILITOT 0.3 05/15/2018   GFRNONAA >60 05/15/2018   GFRAA >60 05/15/2018     Medications: I have reviewed the patient's current medications.   Assessment/Plan: 1. History of Severe anemia-status post a red cell transfusion 03/04/2016 2.. Prostate cancer  Extensive sclerotic bone metastases noted on a CT of the abdomen/pelvis 03/04/2016 and MRI abdomen 03/05/2016  Markedly elevated PSA  Lupron initiated 03/15/2016; Casodex 14 days beginning 03/15/2016  Prostate biopsy 03/18/2016  05/09/2016 PSA significantly improved at 21  Abirateroneinitiated08/15/2017  3. Symptoms of obstructive uropathy. Improved .  4. Distal duodenal and descending colon masses  biopsy of the duodenal mass 03/04/2016 confirmed fragments of small bowel mucosa with focal high-grade dysplasia  Biopsy of the descending colon "polyps" revealed fragments of a tubulovillous adenoma  CT 08/29/2017-similar soft tissue thickening in the region of the splenic flexure of the colon suboptimally evaluated due to lack of colonic enteric contrast  Referred to Dr. Benson Norway  Referred to [facility]  Resection of descending colon polyps 01/31/2018, tubulovillous adenomas with high-grade dysplasia  Attempted endoscopic resection of the fourth segment duodenal "polyp "on 05/09/2018, firm unresectable lesion, biopsy revealed moderately differentiated adenocarcinoma  5. Anorexia/weight loss- resolved  6. Undescended testicles  7. Right renal mass. CT 08/29/2017-right renal mass involving the lower pole was partially calcified and does demonstrate enhancement following contrast most consistent with renal cell carcinoma. Lesion not significantly enlarged compared with the prior CT. Followed by urology.  8. Diabetes    Disposition: Mr. [name] appears  unchanged.  He has metastatic prostate cancer.  He responded to abiraterone/Lupron and there is no evidence for disease progression.  He has been maintained on the current regimen for the past 2 years.  He has been  diagnosed with small bowel carcinoma.  He is scheduled to undergo staging CTs and consultation with Dr. Cloyd Stagers 05/25/2018.  We will see him after surgery and sooner as needed.  He also most likely has a renal cell carcinoma.  Mr. [name] will continue Lupron/abiraterone.  He will return for an office visit in 3 months.   25 minutes were spent with the patient today.  The majority of the time was used for counseling and coordination of care. [name], MD  05/15/2018  1:08 PM

## 2018-05-15 NOTE — Patient Instructions (Signed)
Zoledronic Acid injection (Hypercalcemia, Oncology) What is this medicine? ZOLEDRONIC ACID (ZOE le dron ik AS id) lowers the amount of calcium loss from bone. It is used to treat too much calcium in your blood from cancer. It is also used to prevent complications of cancer that has spread to the bone. This medicine may be used for other purposes; ask your health care provider or pharmacist if you have questions. COMMON BRAND NAME(S): Zometa What should I tell my health care provider before I take this medicine? They need to know if you have any of these conditions: -aspirin-sensitive asthma -cancer, especially if you are receiving medicines used to treat cancer -dental disease or wear dentures -infection -kidney disease -receiving corticosteroids like dexamethasone or prednisone -an unusual or allergic reaction to zoledronic acid, other medicines, foods, dyes, or preservatives -pregnant or trying to get pregnant -breast-feeding How should I use this medicine? This medicine is for infusion into a vein. It is given by a health care professional in a hospital or clinic setting. Talk to your pediatrician regarding the use of this medicine in children. Special care may be needed. Overdosage: If you think you have taken too much of this medicine contact a poison control center or emergency room at once. NOTE: This medicine is only for you. Do not share this medicine with others. What if I miss a dose? It is important not to miss your dose. Call your doctor or health care professional if you are unable to keep an appointment. What may interact with this medicine? -certain antibiotics given by injection -NSAIDs, medicines for pain and inflammation, like ibuprofen or naproxen -some diuretics like bumetanide, furosemide -teriparatide -thalidomide This list may not describe all possible interactions. Give your health care provider a list of all the medicines, herbs, non-prescription drugs, or  dietary supplements you use. Also tell them if you smoke, drink alcohol, or use illegal drugs. Some items may interact with your medicine. What should I watch for while using this medicine? Visit your doctor or health care professional for regular checkups. It may be some time before you see the benefit from this medicine. Do not stop taking your medicine unless your doctor tells you to. Your doctor may order blood tests or other tests to see how you are doing. Women should inform their doctor if they wish to become pregnant or think they might be pregnant. There is a potential for serious side effects to an unborn child. Talk to your health care professional or pharmacist for more information. You should make sure that you get enough calcium and vitamin D while you are taking this medicine. Discuss the foods you eat and the vitamins you take with your health care professional. Some people who take this medicine have severe bone, joint, and/or muscle pain. This medicine may also increase your risk for jaw problems or a broken thigh bone. Tell your doctor right away if you have severe pain in your jaw, bones, joints, or muscles. Tell your doctor if you have any pain that does not go away or that gets worse. Tell your dentist and dental surgeon that you are taking this medicine. You should not have major dental surgery while on this medicine. See your dentist to have a dental exam and fix any dental problems before starting this medicine. Take good care of your teeth while on this medicine. Make sure you see your dentist for regular follow-up appointments. What side effects may I notice from receiving this medicine? Side effects that   you should report to your doctor or health care professional as soon as possible: -allergic reactions like skin rash, itching or hives, swelling of the face, lips, or tongue -anxiety, confusion, or depression -breathing problems -changes in vision -eye pain -feeling faint or  lightheaded, falls -jaw pain, especially after dental work -mouth sores -muscle cramps, stiffness, or weakness -redness, blistering, peeling or loosening of the skin, including inside the mouth -trouble passing urine or change in the amount of urine Side effects that usually do not require medical attention (report to your doctor or health care professional if they continue or are bothersome): -bone, joint, or muscle pain -constipation -diarrhea -fever -hair loss -irritation at site where injected -loss of appetite -nausea, vomiting -stomach upset -trouble sleeping -trouble swallowing -weak or tired This list may not describe all possible side effects. Call your doctor for medical advice about side effects. You may report side effects to FDA at 1-800-FDA-1088. Where should I keep my medicine? This drug is given in a hospital or clinic and will not be stored at home. NOTE: This sheet is a summary. It may not cover all possible information. If you have questions about this medicine, talk to your doctor, pharmacist, or health care provider.  2018 Elsevier/Gold Standard (2014-02-08 14:19:39)  Leuprolide injection What is this medicine? LEUPROLIDE (loo PROE lide) is a man-made hormone. It is used to treat the symptoms of prostate cancer. This medicine may also be used to treat children with early onset of puberty. It may be used for other hormonal conditions. This medicine may be used for other purposes; ask your health care provider or pharmacist if you have questions. COMMON BRAND NAME(S): Lupron What should I tell my health care provider before I take this medicine? They need to know if you have any of these conditions: -diabetes -heart disease or previous heart attack -high blood pressure -high cholesterol -pain or difficulty passing urine -spinal cord metastasis -stroke -tobacco smoker -an unusual or allergic reaction to leuprolide, benzyl alcohol, other medicines, foods,  dyes, or preservatives -pregnant or trying to get pregnant -breast-feeding How should I use this medicine? This medicine is for injection under the skin or into a muscle. You will be taught how to prepare and give this medicine. Use exactly as directed. Take your medicine at regular intervals. Do not take your medicine more often than directed. It is important that you put your used needles and syringes in a special sharps container. Do not put them in a trash can. If you do not have a sharps container, call your pharmacist or healthcare provider to get one. A special MedGuide will be given to you by the pharmacist with each prescription and refill. Be sure to read this information carefully each time. Talk to your pediatrician regarding the use of this medicine in children. While this medicine may be prescribed for children as young as 8 years for selected conditions, precautions do apply. Overdosage: If you think you have taken too much of this medicine contact a poison control center or emergency room at once. NOTE: This medicine is only for you. Do not share this medicine with others. What if I miss a dose? If you miss a dose, take it as soon as you can. If it is almost time for your next dose, take only that dose. Do not take double or extra doses. What may interact with this medicine? Do not take this medicine with any of the following medications: -chasteberry This medicine may also interact with   the following medications: -herbal or dietary supplements, like black cohosh or DHEA -male hormones, like estrogens or progestins and birth control pills, patches, rings, or injections -male hormones, like testosterone This list may not describe all possible interactions. Give your health care provider a list of all the medicines, herbs, non-prescription drugs, or dietary supplements you use. Also tell them if you smoke, drink alcohol, or use illegal drugs. Some items may interact with your  medicine. What should I watch for while using this medicine? Visit your doctor or health care professional for regular checks on your progress. During the first week, your symptoms may get worse, but then will improve as you continue your treatment. You may get hot flashes, increased bone pain, increased difficulty passing urine, or an aggravation of nerve symptoms. Discuss these effects with your doctor or health care professional, some of them may improve with continued use of this medicine. Male patients may experience a menstrual cycle or spotting during the first 2 months of therapy with this medicine. If this continues, contact your doctor or health care professional. What side effects may I notice from receiving this medicine? Side effects that you should report to your doctor or health care professional as soon as possible: -allergic reactions like skin rash, itching or hives, swelling of the face, lips, or tongue -breathing problems -chest pain -depression or memory disorders -pain in your legs or groin -pain at site where injected -severe headache -swelling of the feet and legs -visual changes -vomiting Side effects that usually do not require medical attention (report to your doctor or health care professional if they continue or are bothersome): -breast swelling or tenderness -decrease in sex drive or performance -diarrhea -hot flashes -loss of appetite -muscle, joint, or bone pains -nausea -redness or irritation at site where injected -skin problems or acne This list may not describe all possible side effects. Call your doctor for medical advice about side effects. You may report side effects to FDA at 1-800-FDA-1088. Where should I keep my medicine? Keep out of the reach of children. Store below 25 degrees C (77 degrees F). Do not freeze. Protect from light. Do not use if it is not clear or if there are particles present. Throw away any unused medicine after the  expiration date. NOTE: This sheet is a summary. It may not cover all possible information. If you have questions about this medicine, talk to your doctor, pharmacist, or health care provider.  2018 Elsevier/Gold Standard (2016-05-02 10:54:35)  

## 2018-05-16 LAB — PROSTATE-SPECIFIC AG, SERUM (LABCORP): Prostate Specific Ag, Serum: <0.1 ng/mL (ref 0.0–4.0)

## 2018-05-21 MED FILL — predniSONE 5 MG TABS: 5 | 90 days supply | Qty: 180 | Fill #1

## 2018-05-24 ENCOUNTER — Telehealth: Payer: Self-pay | Admitting: Oncology

## 2018-05-24 NOTE — Telephone Encounter (Signed)
Scheduled appt per 8/28 sch message - pt is aware of appt date and time.

## 2018-07-02 ENCOUNTER — Telehealth: Payer: Self-pay | Admitting: Oncology

## 2018-07-02 NOTE — Telephone Encounter (Signed)
Tried to reach regarding 10/7 sch msg

## 2018-07-03 ENCOUNTER — Ambulatory Visit: Payer: Self-pay | Admitting: Oncology

## 2018-07-03 ENCOUNTER — Other Ambulatory Visit: Payer: Self-pay

## 2018-07-09 ENCOUNTER — Other Ambulatory Visit: Payer: Self-pay | Admitting: Emergency Medicine

## 2018-07-09 DIAGNOSIS — C61 Malignant neoplasm of prostate: Secondary | ICD-10-CM

## 2018-07-10 ENCOUNTER — Inpatient Hospital Stay (HOSPITAL_BASED_OUTPATIENT_CLINIC_OR_DEPARTMENT_OTHER): Payer: Self-pay | Admitting: Nurse Practitioner

## 2018-07-10 ENCOUNTER — Encounter: Payer: Self-pay | Admitting: Nurse Practitioner

## 2018-07-10 ENCOUNTER — Telehealth: Payer: Self-pay | Admitting: Nurse Practitioner

## 2018-07-10 ENCOUNTER — Inpatient Hospital Stay: Payer: Self-pay | Attending: Nurse Practitioner

## 2018-07-10 VITALS — BP 148/88 | HR 103 | Temp 97.9°F | Resp 18 | Ht 65.0 in | Wt 146.4 lb

## 2018-07-10 DIAGNOSIS — Z86010 Personal history of colonic polyps: Secondary | ICD-10-CM | POA: Insufficient documentation

## 2018-07-10 DIAGNOSIS — Q5320 Undescended testicle, unspecified, bilateral: Secondary | ICD-10-CM

## 2018-07-10 DIAGNOSIS — Z7952 Long term (current) use of systemic steroids: Secondary | ICD-10-CM | POA: Insufficient documentation

## 2018-07-10 DIAGNOSIS — N2889 Other specified disorders of kidney and ureter: Secondary | ICD-10-CM

## 2018-07-10 DIAGNOSIS — C7951 Secondary malignant neoplasm of bone: Secondary | ICD-10-CM

## 2018-07-10 DIAGNOSIS — Z79899 Other long term (current) drug therapy: Secondary | ICD-10-CM

## 2018-07-10 DIAGNOSIS — Z952 Presence of prosthetic heart valve: Secondary | ICD-10-CM

## 2018-07-10 DIAGNOSIS — D124 Benign neoplasm of descending colon: Secondary | ICD-10-CM | POA: Insufficient documentation

## 2018-07-10 DIAGNOSIS — C61 Malignant neoplasm of prostate: Secondary | ICD-10-CM

## 2018-07-10 DIAGNOSIS — E119 Type 2 diabetes mellitus without complications: Secondary | ICD-10-CM | POA: Insufficient documentation

## 2018-07-10 LAB — CMP (CANCER CENTER ONLY)
ALBUMIN: 3.3 g/dL — AB (ref 3.5–5.0)
ALT: 20 U/L (ref 0–44)
AST: 12 U/L — AB (ref 15–41)
Alkaline Phosphatase: 85 U/L (ref 38–126)
Anion gap: 13 (ref 5–15)
BUN: 24 mg/dL — AB (ref 6–20)
CHLORIDE: 97 mmol/L — AB (ref 98–111)
CO2: 27 mmol/L (ref 22–32)
CREATININE: 0.98 mg/dL (ref 0.61–1.24)
Calcium: 9.9 mg/dL (ref 8.9–10.3)
GFR, Est AFR Am: >60 mL/min (ref >60)
Glucose, Bld: 302 mg/dL — ABNORMAL HIGH (ref 70–99)
POTASSIUM: 3.5 mmol/L (ref 3.5–5.1)
Sodium: 137 mmol/L (ref 135–145)
Total Bilirubin: 0.4 mg/dL (ref 0.3–1.2)
Total Protein: 7.3 g/dL (ref 6.5–8.1)

## 2018-07-10 LAB — CBC WITH DIFFERENTIAL (CANCER CENTER ONLY)
ABS IMMATURE GRANULOCYTES: 0.04 10*3/uL (ref 0.00–0.07)
Basophils Absolute: 0 10*3/uL (ref 0.0–0.1)
Basophils Relative: 0 %
EOS PCT: 0 %
Eosinophils Absolute: 0 10*3/uL (ref 0.0–0.5)
HEMATOCRIT: 29.3 % — AB (ref 39.0–52.0)
HEMOGLOBIN: 8.9 g/dL — AB (ref 13.0–17.0)
Immature Granulocytes: 0 %
LYMPHS ABS: 1.2 10*3/uL (ref 0.7–4.0)
LYMPHS PCT: 11 %
MCH: 28.9 pg (ref 26.0–34.0)
MCHC: 30.4 g/dL (ref 30.0–36.0)
MCV: 95.1 fL (ref 80.0–100.0)
MONO ABS: 0.7 10*3/uL (ref 0.1–1.0)
MONOS PCT: 6 %
NEUTROS ABS: 8.3 10*3/uL — AB (ref 1.7–7.7)
Neutrophils Relative %: 83 %
Platelet Count: 546 10*3/uL — ABNORMAL HIGH (ref 150–400)
RBC: 3.08 MIL/uL — AB (ref 4.22–5.81)
RDW: 14.9 % (ref 11.5–15.5)
WBC Count: 10.2 10*3/uL (ref 4.0–10.5)
nRBC: 0 % (ref 0.0–0.2)

## 2018-07-10 NOTE — Progress Notes (Addendum)
[place] OFFICE PROGRESS NOTE   Diagnosis: Prostate cancer, small bowel carcinoma  INTERVAL HISTORY:   Brandon Peters returns as scheduled.  He continues abiraterone and prednisone.  He is maintained on every 46-monthLupron and Zometa.  He underwent surgery with Dr. CCloyd Stagers9/24/2019.  He reports he overall is recovering well.  Appetite is improving.  He denies pain.  Bowels are moving.  No nausea or vomiting.  No bleeding.  Leg swelling is better.  Objective:  Vital signs in last 24 hours:  Blood pressure (!) 148/88, pulse (!) 103, temperature 97.9 F (36.6 C), temperature source Oral, resp. rate 18, height 5' 5" (1.651 m), weight 146 lb 6.4 oz (66.4 kg), SpO2 99 %.    HEENT: No thrush or ulcers. Lymphatics: No palpable cervical or supraclavicular lymph nodes. Resp: Lungs clear bilaterally. Cardio: Regular rate and rhythm. GI: Abdomen soft and nontender.  Healed abdominal surgical incision. Vascular: Trace edema at the lower legs bilaterally slightly greater than right. Skin: Healing large blister type lesion at the right lower leg.   Lab Results:  Lab Results  Component Value Date   WBC 10.2 07/10/2018   HGB 8.9 (L) 07/10/2018   HCT 29.3 (L) 07/10/2018   MCV 95.1 07/10/2018   PLT 546 (H) 07/10/2018   NEUTROABS 8.3 (H) 07/10/2018    Imaging:  No results found.  Medications: I have reviewed the patient's current medications.  Assessment/Plan: 1. History of Severe anemia-status post a red cell transfusion 03/04/2016 2.. Prostate cancer  Extensive sclerotic bone metastases noted on a CT of the abdomen/pelvis 03/04/2016 and MRI abdomen 03/05/2016  Markedly elevated PSA  Lupron initiated 03/15/2016; Casodex 14 days beginning 03/15/2016  Prostate biopsy 03/18/2016  05/09/2016 PSA significantly improved at 21  Abirateroneinitiated08/15/2017  3. Symptoms of obstructive uropathy. Improved .  4. Distal duodenal and descending colon  masses  biopsy of the duodenal mass 03/04/2016 confirmed fragments of small bowel mucosa with focal high-grade dysplasia  Biopsy of the descending colon "polyps" revealed fragments of a tubulovillous adenoma  CT 08/29/2017-similar soft tissue thickening in the region of the splenic flexure of the colon suboptimally evaluated due to lack of colonic enteric contrast  Referred to Dr. HBenson Peters Referred to Brandon Peters Resection of descending colon polyps 01/31/2018, tubulovillous adenomas with high-grade dysplasia  Attempted endoscopic resection of the fourth segment duodenal "polyp" on 05/09/2018, firm unresectable lesion, biopsy revealed moderately differentiated adenocarcinoma  CT abdomen/pelvis 05/25/2018- heterogeneously enhancing right lower pole renal lesion; diffuse sclerotic osseous metastatic lesions compatible with history of prostate cancer  CT chest 05/25/2018- no pulmonary metastasis.  Diffuse osseous metastasis involving the axial and appendicular skeleton.  06/19/2018-sleeve resection of third and fourth portions of duodenum with primary end-to-end anastomosis, feeding gastrojejunostomy tube placement, Dr. CCloyd Peters poorly differentiated adenocarcinoma duodenum, 3 cm; tumor invades the muscularis propria; lymphovascular invasion present; negative margins; 4 of 11 involved lymph nodes; soft tissue deposits also seen in the mesentery; all mucosal margins and soft tissue margins negative but 2 of the positive nodes are found in the soft tissue margin submitted for frozen section (pT2 pN2); second mucosal lesion 1.2 cm from the proximal margin, tubular adenoma with focal severe dysplasia  Intact expression of mismatch repair proteins by IHC; microsatellite stable  5. Anorexia/weight loss- resolved  6. Undescended testicles  7. Right renal mass. CT 08/29/2017-right renal mass involving the lower pole was partially calcified and does demonstrate enhancement following contrast most consistent with  renal cell carcinoma. Lesion not significantly  enlarged compared with the prior CT. Followed by urology.  8. Diabetes  Disposition: Brandon Peters is a 62 year old man with metastatic prostate cancer currently being treated with abiraterone and prednisone.  He is also maintained on every 104-monthLupron and Zometa.  He has been diagnosed with stage III small bowel cancer.  Brandon Peters the diagnosis and prognosis of the small bowel cancer with Mr. HPlemmonsand his family.  Dr. SBenay Spicerecommends adjuvant therapy and discussed Xeloda versus FOLFOX.  We reviewed potential toxicities associated with Xeloda including bone marrow toxicity, nausea, hair loss, mouth sores, diarrhea, hand-foot syndrome, skin hyperpigmentation, rash, increased sensitivity to sun.  We discussed potential toxicities associated with Oxaliplatin.  The mutual decision is to proceed with a 671-monthourse of Xeloda.  We anticipate a start date of 07/23/2018.  We will ask the [facility] pharmacist to contact Brandon Peters.  We will arrange for a follow-up visit 08/09/2018.  He will contact the office in the interim with any problems.  Patient seen with Dr. ShBenay Spice 25 minutes were spent face-to-face at today's visit with the majority of that time involved in counseling/coordination of care    Brandon CardNP/GNP-BC   07/10/2018  3:34 PM  This was a shared visit with Brandon Peters Brandon Peters interviewed and examined.  We reviewed the operative note and pathology report from Brandon Peters He has been diagnosed with stage III small bowel adenocarcinoma.  We discussed the prognosis and adjuvant treatment options.  There is a significant chance of developing recurrent disease over the next few years.  He understands there is no clear standard approach for recommending adjuvant therapy in patients with resected small bowel cancer based on the lack of large clinical trials.  However he often extrapolate from the colon  cancer literature where there is data to support a survival benefit with adjuvant 5-fluorouracil based chemotherapy in this setting.  We discussed effective benefits from single agent 5-fluorouracil and the addition of oxalic platinum.  Mr. HoKieras metastatic prostate cancer, currently in clinical remission while being treated with hormonal therapy.  However the prostate cancer will not be cured.  We discussed single agent capecitabine and FOLFOX chemotherapy with Brandon Peters We reviewed potential toxicities associated with these regimens.  He is most comfortable proceeding with capecitabine.  The plan is to begin adjuvant capecitabine on 07/23/2018.  We will make a referral to the genetics clinic given his history of small bowel cancer, prostate cancer, and renal cell cancer.  He also has a history of multiple colon and small bowel adenomas.   BrJulieanne MansonMD

## 2018-07-10 NOTE — Telephone Encounter (Signed)
Scheduled appt per 10/15 los - gave patient AVS and calender per los.   

## 2018-07-11 ENCOUNTER — Telehealth: Payer: Self-pay | Admitting: Emergency Medicine

## 2018-07-11 ENCOUNTER — Telehealth: Payer: Self-pay | Admitting: Pharmacist

## 2018-07-11 DIAGNOSIS — C179 Malignant neoplasm of small intestine, unspecified: Secondary | ICD-10-CM

## 2018-07-11 LAB — PROSTATE-SPECIFIC AG, SERUM (LABCORP): Prostate Specific Ag, Serum: <0.1 ng/mL (ref 0.0–4.0)

## 2018-07-11 MED ORDER — CAPECITABINE 500 MG PO TABS: ORAL_TABLET | ORAL | 0 refills | Status: DC

## 2018-07-11 NOTE — Telephone Encounter (Addendum)
Pt verbalized understanding   ----- Message from Owens Shark, NP sent at 07/11/2018  8:38 AM EDT ----- Please let him know PSA is stable in normal range.

## 2018-07-11 NOTE — Telephone Encounter (Signed)
Oral Oncology Pharmacist Encounter  Received new prescription for Xeloda (capecitabine) for the adjuvant treatment of stage III small bowel cancer, planned duration 6 months of therapy (8 cycles).  Patient is currently on Zytiga/prednisone for prostate cancer.  Labs from 07/10/2018 assessed, OK for treatment.  Xeloda will be dosed at 1000mg /m2 BID for 14 days on, 7 days off, repeated every 21, as monotherapy, days x 8 planned cycles Planned start date: 07/23/18  Current medication list in Epic reviewed, no DDIs with Xeloda identified.  Prescription has been e-scribed to the [facility] for benefits analysis and approval.  It does not appear that patient has any prescription insurance coverage. Will confirm with patient and work with him to determine best course for Xeloda acquisition.  Oral Oncology Clinic will continue to follow for insurance authorization, copayment issues, initial counseling and start date.  Johny Drilling, PharmD, BCPS, BCOP  07/11/2018 1:16 PM Oral Oncology Clinic [phone]

## 2018-07-12 NOTE — Telephone Encounter (Signed)
Oral Chemotherapy Pharmacist Encounter   I spoke with patient for overview of: Xeloda (capecitabine) for the adjuvant treatment of stage III small bowel cancer, planned duration 6 months of therapy (8 cycles).   Counseled patient on administration, dosing, side effects, monitoring, drug-food interactions, safe handling, storage, and disposal.  Patient will take Xeloda 500mg  tablets, 4 tablets (2000mg ) by mouth in AM and 3 tabs (1500mg ) by mouth in PM, within 30 minutes of finishing meals, on days 1-14 of each 21 day cycle.   Xeloda start date: 07/23/2018  Adverse effects include but are not limited to: fatigue, decreased blood counts, GI upset, diarrhea, and hand-foot syndrome.  Patient has anti-emetic on hand and knows to take it if nausea develops.   Patient will obtain anti diarrheal and alert the office of 4 or more loose stools above baseline.  Reviewed with patient importance of keeping a medication schedule and plan for any missed doses.  Mr. Dunnam voiced understanding and appreciation.   All questions answered. Medication reconciliation performed and medication/allergy list updated.  Patient without prescription insurance coverage. Patient has previously been approved for this [place]. Patient will pick up his first fill of Xeloda from the [facility] outpatient pharmacy week of 07/16/2018.   Copayment for the Xeloda will bill directly to the his [place] for co-pay approximately $5 per fill.  Confirmed office visits on 08/09/2018 with patient.  Patient knows to call the office with questions or concerns. Oral Oncology Clinic will continue to follow.  Johny Drilling, PharmD, BCPS, BCOP  07/12/2018   12:54 PM Oral Oncology Clinic [phone]

## 2018-07-16 MED FILL — XELODA 500 MG TABLET: 500 | 21 days supply | Qty: 98 | Fill #0

## 2018-07-16 NOTE — Telephone Encounter (Signed)
Oral Oncology Patient Advocate Encounter  Confirmed with [facility] that Xeloda was picked up on 07/16/18   Thad Osoria CPHT Specialty Pharmacy Patient Advocate [facility] Phone [phone] Fax [phone]   

## 2018-08-06 ENCOUNTER — Other Ambulatory Visit: Payer: Self-pay | Admitting: Oncology

## 2018-08-06 ENCOUNTER — Telehealth: Payer: Self-pay | Admitting: Pharmacist

## 2018-08-06 DIAGNOSIS — C179 Malignant neoplasm of small intestine, unspecified: Secondary | ICD-10-CM

## 2018-08-06 NOTE — Telephone Encounter (Signed)
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   Original Start date of oral chemotherapy: 07/23/2018  Pt is doing well today  Pt reports 0 tablets/doses of Xeloda 500mg  tablets, 4 tablets (2000mg ) by mouth in AM and 3 tabs (1500mg ) by mouth in PM, within 30 minutes of finishing meals, on days 1-14 of each 21 day cycle missed in the last 2 weeks.   Pt reports the following side effects: none to report, patient states he experienced no ill effects from his 1st cycle of Xeloda  Pertinent labs reviewed: OK for continued treatment.  Other Issues: Patient called to order Xeloda refill. Patient instructed pharmacy will be reaching to him sometime today to schedule his next fill of Xeloda and they will contact Dr. Benay Spice if they need a new prescription. Patint voiced understanding and appreciation.  Patient knows to call the office with questions or concerns. Oral Oncology Clinic will continue to follow.  Johny Drilling, PharmD, BCPS, BCOP  08/06/2018 10:35 AM Oral Oncology Clinic [phone]

## 2018-08-09 ENCOUNTER — Other Ambulatory Visit: Payer: Self-pay

## 2018-08-09 ENCOUNTER — Inpatient Hospital Stay: Payer: Self-pay | Attending: Nurse Practitioner

## 2018-08-09 ENCOUNTER — Encounter: Payer: Self-pay | Admitting: Licensed Clinical Social Worker

## 2018-08-09 ENCOUNTER — Ambulatory Visit: Payer: Self-pay | Admitting: Nurse Practitioner

## 2018-08-09 ENCOUNTER — Encounter: Payer: Self-pay | Admitting: Nurse Practitioner

## 2018-08-09 ENCOUNTER — Inpatient Hospital Stay (HOSPITAL_BASED_OUTPATIENT_CLINIC_OR_DEPARTMENT_OTHER): Payer: Self-pay | Admitting: Licensed Clinical Social Worker

## 2018-08-09 ENCOUNTER — Inpatient Hospital Stay (HOSPITAL_BASED_OUTPATIENT_CLINIC_OR_DEPARTMENT_OTHER): Payer: Self-pay | Admitting: Nurse Practitioner

## 2018-08-09 ENCOUNTER — Inpatient Hospital Stay: Payer: Self-pay

## 2018-08-09 ENCOUNTER — Telehealth: Payer: Self-pay

## 2018-08-09 VITALS — BP 140/86 | HR 94 | Temp 97.9°F | Resp 18 | Ht 65.0 in | Wt 148.0 lb

## 2018-08-09 DIAGNOSIS — C61 Malignant neoplasm of prostate: Secondary | ICD-10-CM | POA: Insufficient documentation

## 2018-08-09 DIAGNOSIS — C7951 Secondary malignant neoplasm of bone: Secondary | ICD-10-CM

## 2018-08-09 DIAGNOSIS — C179 Malignant neoplasm of small intestine, unspecified: Secondary | ICD-10-CM

## 2018-08-09 DIAGNOSIS — Z85068 Personal history of other malignant neoplasm of small intestine: Secondary | ICD-10-CM | POA: Insufficient documentation

## 2018-08-09 DIAGNOSIS — Z801 Family history of malignant neoplasm of trachea, bronchus and lung: Secondary | ICD-10-CM | POA: Insufficient documentation

## 2018-08-09 DIAGNOSIS — D124 Benign neoplasm of descending colon: Secondary | ICD-10-CM

## 2018-08-09 LAB — CBC WITH DIFFERENTIAL (CANCER CENTER ONLY)
ABS IMMATURE GRANULOCYTES: 0.13 10*3/uL — AB (ref 0.00–0.07)
BASOS PCT: 0 %
Basophils Absolute: 0 10*3/uL (ref 0.0–0.1)
EOS ABS: 0 10*3/uL (ref 0.0–0.5)
Eosinophils Relative: 0 %
HEMATOCRIT: 30.5 % — AB (ref 39.0–52.0)
Hemoglobin: 9.4 g/dL — ABNORMAL LOW (ref 13.0–17.0)
Immature Granulocytes: 1 %
LYMPHS ABS: 1.4 10*3/uL (ref 0.7–4.0)
Lymphocytes Relative: 15 %
MCH: 29.2 pg (ref 26.0–34.0)
MCHC: 30.8 g/dL (ref 30.0–36.0)
MCV: 94.7 fL (ref 80.0–100.0)
MONO ABS: 0.6 10*3/uL (ref 0.1–1.0)
MONOS PCT: 6 %
NEUTROS ABS: 7.2 10*3/uL (ref 1.7–7.7)
Neutrophils Relative %: 78 %
Platelet Count: 521 10*3/uL — ABNORMAL HIGH (ref 150–400)
RBC: 3.22 MIL/uL — ABNORMAL LOW (ref 4.22–5.81)
RDW: 15.5 % (ref 11.5–15.5)
WBC Count: 9.4 10*3/uL (ref 4.0–10.5)
nRBC: 0 % (ref 0.0–0.2)

## 2018-08-09 LAB — CMP (CANCER CENTER ONLY)
ALK PHOS: 86 U/L (ref 38–126)
ALT: 21 U/L (ref 0–44)
AST: 11 U/L — AB (ref 15–41)
Albumin: 3.6 g/dL (ref 3.5–5.0)
Anion gap: 13 (ref 5–15)
BILIRUBIN TOTAL: 0.3 mg/dL (ref 0.3–1.2)
BUN: 27 mg/dL — AB (ref 6–20)
CALCIUM: 10.3 mg/dL (ref 8.9–10.3)
CO2: 27 mmol/L (ref 22–32)
Chloride: 97 mmol/L — ABNORMAL LOW (ref 98–111)
Creatinine: 0.95 mg/dL (ref 0.61–1.24)
GFR, Est AFR Am: >60 mL/min (ref >60)
GFR, Estimated: >60 mL/min (ref >60)
Glucose, Bld: 271 mg/dL — ABNORMAL HIGH (ref 70–99)
Potassium: 3.4 mmol/L — ABNORMAL LOW (ref 3.5–5.1)
Sodium: 137 mmol/L (ref 135–145)
Total Protein: 7.4 g/dL (ref 6.5–8.1)

## 2018-08-09 MED ORDER — LEUPROLIDE ACETATE (3 MONTH) 22.5 MG IM KIT
22.5000 mg | PACK | Freq: Once | INTRAMUSCULAR | Status: AC
  Administered 2018-08-09: 22.5 mg via INTRAMUSCULAR

## 2018-08-09 MED ORDER — CAPECITABINE 500 MG PO TABS: ORAL_TABLET | ORAL | 0 refills | Status: DC

## 2018-08-09 MED ORDER — LEUPROLIDE ACETATE (3 MONTH) 22.5 MG IM KIT
PACK | INTRAMUSCULAR | Status: AC
  Filled 2018-08-09: qty 22.5

## 2018-08-09 MED FILL — XELODA 500 MG TABLET: 500 | 21 days supply | Qty: 98 | Fill #0

## 2018-08-09 NOTE — Telephone Encounter (Signed)
Printed avs and calender of upcoming appointment. Per 11/14 los 

## 2018-08-09 NOTE — Progress Notes (Signed)
[facility] OFFICE PROGRESS NOTE   Diagnosis: Prostate cancer, small bowel carcinoma  INTERVAL HISTORY:   [name] returns as scheduled.  He completed cycle 1 adjuvant Xeloda beginning 07/23/2018.  He denies nausea/vomiting.  No mouth sores.  He had a single episode of diarrhea earlier this week.  No hand or foot pain or redness.  He notes hot flashes are less.  He denies pain.  Objective:  Vital signs in last 24 hours:  Blood pressure 140/86, pulse 94, temperature 97.9 F (36.6 C), temperature source Oral, resp. rate 18, height _0  (1.651 m), weight 148 lb (67.1 kg), SpO2 100 %.    HEENT: No thrush or ulcers. Resp: Lungs clear bilaterally. Cardio: Regular rate and rhythm. GI: Abdomen soft and nontender.  No hepatomegaly.  Healed surgical incisions. Vascular: Trace edema at the lower legs bilaterally. Skin: Palms without erythema.  Palms with mild hyperpigmentation.   Lab Results:  Lab Results  Component Value Date   WBC 10.2 07/10/2018   HGB 8.9 (L) 07/10/2018   HCT 29.3 (L) 07/10/2018   MCV 95.1 07/10/2018   PLT 546 (H) 07/10/2018   NEUTROABS 8.3 (H) 07/10/2018    Imaging:  No results found.  Medications: I have reviewed the patient's current medications.  Assessment/Plan: 1. History of Severe anemia-status post a red cell transfusion 03/04/2016 2.. Prostate cancer  Extensive sclerotic bone metastases noted on a CT of the abdomen/pelvis 03/04/2016 and MRI abdomen 03/05/2016  Markedly elevated PSA  Lupron initiated 03/15/2016; Casodex 14 days beginning 03/15/2016  Prostate biopsy 03/18/2016  05/09/2016 PSA significantly improved at 21  Abirateroneinitiated08/15/2017  PSA less than 0.1 07/10/2018  3. Symptoms of obstructive uropathy. Improved .  4. Distal duodenal and descending colon masses  biopsy of the duodenal mass 03/04/2016 confirmed fragments of small bowel mucosa with focal high-grade dysplasia  Biopsy of the  descending colon "polyps" revealed fragments of a tubulovillous adenoma  CT 08/29/2017-similar soft tissue thickening in the region of the splenic flexure of the colon suboptimally evaluated due to lack of colonic enteric contrast  Referred to Dr. Benson Norway  Referred to [facility]  Resection of descending colon polyps 01/31/2018, tubulovillous adenomas with high-grade dysplasia  Attempted endoscopic resection of the fourth segment duodenal "polyp" on 05/09/2018, firm unresectable lesion, biopsy revealed moderately differentiated adenocarcinoma  CT abdomen/pelvis 05/25/2018- heterogeneously enhancing right lower pole renal lesion; diffuse sclerotic osseous metastatic lesions compatible with history of prostate cancer  CT chest 05/25/2018- no pulmonary metastasis.  Diffuse osseous metastasis involving the axial and appendicular skeleton.  06/19/2018-sleeve resection of third and fourth portions of duodenum with primary end-to-end anastomosis, feeding gastrojejunostomy tube placement, Dr. Cloyd Stagers; poorly differentiated adenocarcinoma duodenum, 3 cm; tumor invades the muscularis propria; lymphovascular invasion present; negative margins; 4 of 11 involved lymph nodes; soft tissue deposits also seen in the mesentery; all mucosal margins and soft tissue margins negative but 2 of the positive nodes are found in the soft tissue margin submitted for frozen section (pT2 pN2); second mucosal lesion 1.2 cm from the proximal margin, tubular adenoma with focal severe dysplasia  Intact expression of mismatch repair proteins by IHC; microsatellite stable  Cycle 1 adjuvant Xeloda 07/23/2018  Cycle 2 adjuvant Xeloda 08/13/2018  5. Anorexia/weight loss-resolved  6. Undescended testicles  7. Right renal mass. CT 08/29/2017-right renal mass involving the lower pole was partially calcified and does demonstrate enhancement following contrast most consistent with renal cell carcinoma. Lesion not significantly enlarged  compared with the prior CT. Followed by urology.  8. Diabetes   Disposition: Mr. [name] appears stable.  He has completed 1 cycle of adjuvant Xeloda.  Overall he tolerated well.  Plan to proceed with cycle 2 beginning 08/13/2018.  For the prostate cancer the plan is to continue abiraterone, prednisone and every 54-monthLupron.  He receives Zometa every 3 months.  He will receive the Lupron injection today.  He will receive Zometa when he returns in 3 weeks.  He will return for lab, follow-up, Zometa in 3 weeks.  He will contact the office in the interim with any problems.  Plan reviewed with Dr. SBenay Spice    LNed CardANP/GNP-BC   08/09/2018  12:54 PM

## 2018-08-09 NOTE — Progress Notes (Signed)
REFERRING PROVIDER: Ladell Pier, MD [address] [place], [place] [address]  PRIMARY PROVIDER:  Care, [facility]  PRIMARY REASON FOR VISIT:  1. Prostatic cancer ([place])   2. Personal history of other malignant neoplasm of small intestine   3. Family history of lung cancer      HISTORY OF PRESENT ILLNESS:   [name], a 62 y.o. male, was seen for a [place] cancer genetics consultation at the request of Dr. Benay Spice due to a personal and family history of cancer.  Mr. Dugue presents to clinic today to discuss the possibility of a hereditary predisposition to cancer, genetic testing, and to further clarify his future cancer risks, as well as potential cancer risks for family members.   In 2017, at the age of 52, [name] was diagnosed with metastatic prostate cancer with bone metastases and prostate biopsy 02/2016. This has been treated with hormone therapy. In 2019, at the age of 60, [name] was diagnosed with small bowel carcinoma, normal IHC and MSI. This was treated with surgery and chemo. He also possibly has renal cell carcinoma; he has a right renal mass noted on CT that is being observed. Additionally, he had 2 polyps on his colonoscopy in 2017 that were removed that were found to be fragments of tubulovillous adenoma.   Past Medical History:  Diagnosis Date  . ARF (acute renal failure) (HCC)    d/t lisinopril and/or diazide- was on both in a 2 week period- cr up to 4.99  . Diabetes mellitus   . Family history of lung cancer   . Hyperlipidemia   . Hypertension   . Personal history of other malignant neoplasm of small intestine   . RENAL CALCULUS, HX OF 06/26/2007    Past Surgical History:  Procedure Laterality Date  . COLONOSCOPY N/A 03/04/2016   Procedure: COLONOSCOPY;  Surgeon: Carol Ada, MD;  Location: [facility] ENDOSCOPY;  Service: Endoscopy;  Laterality: N/A;  . ENTEROSCOPY N/A 03/04/2016   Procedure: ENTEROSCOPY;  Surgeon: Carol Ada, MD;   Location: [facility] ENDOSCOPY;  Service: Endoscopy;  Laterality: N/A;    Social History   Socioeconomic History  . Marital status: Single    Spouse name: Not on file  . Number of children: Not on file  . Years of education: Not on file  . Highest education level: Not on file  Occupational History  . Not on file  Social Needs  . Financial resource strain: Not on file  . Food insecurity:    Worry: Not on file    Inability: Not on file  . Transportation needs:    Medical: Not on file    Non-medical: Not on file  Tobacco Use  . Smoking status: Never Smoker  . Smokeless tobacco: Never Used  Substance and Sexual Activity  . Alcohol use: No  . Drug use: No  . Sexual activity: Not on file  Lifestyle  . Physical activity:    Days per week: Not on file    Minutes per session: Not on file  . Stress: Not on file  Relationships  . Social connections:    Talks on phone: Not on file    Gets together: Not on file    Attends religious service: Not on file    Active member of club or organization: Not on file    Attends meetings of clubs or organizations: Not on file    Relationship status: Not on file  Other Topics Concern  . 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 Never married. 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  Significant diagnoses are listed below: Family History  Problem Relation Age of Onset  . Diabetes Mother   . Hypertension Mother   . Coronary artery disease Mother   . Alcohol abuse Father   . Hypertension Sister    [name] does not have children. He had two brothers and three sisters. All are living except one of his sisters, Arbie Cookey, passed away in her 48's of a heart attack. No cancers in his siblings.   [name] mother had lung cancer diagnosed at 43 and passed away at 10. She  had three brothers. One of [name] maternal uncles also had lung cancer, diagnosed in his  38's. Both [name] mother and uncle had smoking history. [name] does not have information about maternal grandparents or maternal cousins.  [name] does not have information about his father. He knows that he had siblings, and that a paternal aunt had cancer, but does not know type. No information about paternal cousins, or grandparents.   [name] is unaware of previous family history of genetic testing for hereditary cancer risks. Patient's maternal ancestors are of African American descent, and paternal ancestors are of African American/Native American descent. There is no reported Ashkenazi Jewish ancestry. There is no known consanguinity.  GENETIC COUNSELING ASSESSMENT: [name] is a 62 y.o. 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 We, therefore, discussed and recommended the following at today's visit.   DISCUSSION: We discussed that about 5-10% of cancer cases are hereditary. We discussed the BRCA genes in particular, noting that there are many other genes each associated with their own different cancers. We reviewed the characteristics, features and inheritance patterns of hereditary cancer syndromes. We also discussed genetic testing, including the appropriate family members to test, the process of testing, insurance coverage and turn-around-time for results. We discussed the implications of a negative, positive and/or variant of uncertain significant result. We recommended Mr. Olesen pursue genetic testing for the [facility] Multi-Cancer Panel.  The Multi-Cancer Panel offered by [facility] includes sequencing and/or deletion duplication testing of the following 84 genes: AIP, ALK, APC, ATM, AXIN2,BAP1,  BARD1, BLM, BMPR1A, BRCA1, BRCA2, BRIP1, CASR, CDC73, CDH1, CDK4, CDKN1B, CDKN1C, CDKN2A  (p14ARF), CDKN2A (p16INK4a), CEBPA, CHEK2, CTNNA1, DICER1, DIS3L2, EGFR (c.2369C>T, p.Thr790Met variant only), EPCAM (Deletion/duplication testing only), FH, FLCN, GATA2, GPC3, GREM1 (Promoter region deletion/duplication testing only), HOXB13 (c.251G>A, p.Gly84Glu), HRAS, KIT, MAX, MEN1, MET, MITF (c.952G>A, p.Glu318Lys variant only), MLH1, MSH2, MSH3, MSH6, MUTYH, NBN, NF1, NF2, NTHL1, PALB2, PDGFRA, PHOX2B, PMS2, POLD1, POLE, POT1, PRKAR1A, PTCH1, PTEN, RAD50, RAD51C, RAD51D, RB1, RECQL4, RET, RUNX1, SDHAF2, SDHA (sequence changes only), SDHB, SDHC, SDHD, SMAD4, SMARCA4, SMARCB1, SMARCE1, STK11, SUFU, TERC, TERT, TMEM127, TP53, TSC1, TSC2, VHL, WRN and WT1.   We discussed that if he is found to have a mutation in one of these genes, it may impact future medical management recommendations such as increased cancer screenings.  A positive result could also have implications for the patient's family members.  A Negative result would mean we were unable to identify a hereditary component to his personal history of cancer but does not rule out the possibility  of a hereditary basis for his personal history of cancer.  There could be mutations that are undetectable by current technology, or in genes not yet tested or identified to increase cancer risk.    We discussed the potential to find a Variant of Uncertain Significance or VUS.  These are variants that have not yet been identified as pathogenic or benign, and it is unknown if this variant is associated with increased cancer risk or if this is a normal finding.  Most VUS's are reclassified to benign or likely benign.   It should not be used to make medical management decisions. With time, we suspect the lab will determine the significance of any VUS's identified if any.   Based on Mr. Keimig personal history of metastatic prostate cancer, he meets NCCN medical criteria for genetic testing. The patient does not have insurance, so the patient pay option was  selected and the Patient Assistance Program form was completed and sent to [facility].   PLAN: After considering the risks, benefits, and limitations, Mr. Riordan  provided informed consent to pursue genetic testing and the blood sample was sent to [facility] for analysis of the Multi-Cancer Panel. Results should be available within approximately 2-3 weeks' time, at which point they will be disclosed by telephone to Mr. Griep, as will any additional recommendations warranted by these results. Mr. Lacasse will receive a summary of his genetic counseling visit and a copy of his results once available. This information will also be available in Epic.   Lastly, we encouraged Mr. Burstein to remain in contact with cancer genetics annually so that we can continuously update the family history and inform him of any changes in cancer genetics and testing that may be of benefit for this family.   Mr.  Hetzer questions were answered to his satisfaction today. Our contact information was provided should additional questions or concerns arise. Thank you for the referral and allowing [place] to share in the care of your patient.   Faith Rogue, MS Genetic Counselor [email or web address] Phone: [phone]  The patient was seen for a total of 30 minutes in face-to-face genetic counseling.  The patient was accompanied today by his sister, Caren Griffins.

## 2018-08-10 LAB — PROSTATE-SPECIFIC AG, SERUM (LABCORP)

## 2018-08-14 ENCOUNTER — Ambulatory Visit: Payer: Self-pay

## 2018-08-14 ENCOUNTER — Other Ambulatory Visit: Payer: Self-pay

## 2018-08-14 ENCOUNTER — Ambulatory Visit: Payer: Self-pay | Admitting: Nurse Practitioner

## 2018-08-16 ENCOUNTER — Encounter: Payer: Self-pay | Admitting: Licensed Clinical Social Worker

## 2018-08-16 ENCOUNTER — Ambulatory Visit: Payer: Self-pay | Admitting: Licensed Clinical Social Worker

## 2018-08-16 ENCOUNTER — Telehealth: Payer: Self-pay | Admitting: Licensed Clinical Social Worker

## 2018-08-16 DIAGNOSIS — Z1379 Encounter for other screening for genetic and chromosomal anomalies: Secondary | ICD-10-CM | POA: Insufficient documentation

## 2018-08-16 NOTE — Telephone Encounter (Signed)
Revealed negative genetic testing.  Revealed that a VUS in DICER1 was identified. This normal result is reassuring and indicates that it is unlikely Brandon Peters cancer is due to a hereditary cause.  It is unlikely that there is an increased risk of another cancer due to a mutation in one of these genes.  However, genetic testing is not perfect, and cannot definitively rule out a hereditary cause.  It will be important for him to keep in contact with genetics to learn if any additional testing may be needed in the future. We discussed that he should continue to follow his doctors' recommendations for cancer screenings.

## 2018-08-16 NOTE — Progress Notes (Signed)
HPI:  Brandon Peters was previously seen in the [facility] [place] clinic on 08/09/2018 due to a personal history of cancer and concerns regarding a hereditary predisposition to cancer. Please refer to our prior cancer genetics clinic note for more information regarding Brandon Peters's medical, social and family histories, and our assessment and recommendations, at the time. Brandon Peters recent genetic test results were disclosed to him, as well as recommendations warranted by these results. These results and recommendations are discussed in more detail below.  In 2017, at the age of 19, Brandon Peters was diagnosed with metastatic prostate cancer with bone metastases and prostate biopsy 02/2016. This has been treated with hormone therapy. In 2019, at the age of 49, Brandon Peters was diagnosed with small bowel carcinoma, normal IHC and MSI. This was treated with surgery and chemo. He also possibly has renal cell carcinoma; he has a right renal mass noted on CT that is being observed. Additionally, he had 2 polyps on his colonoscopy in 2017 that were removed that were found to be fragments of tubulovillous adenoma.    FAMILY HISTORY:  We obtained a detailed, 4-generation family history.  Significant diagnoses are listed below: Family History  Problem Relation Age of Onset  . Diabetes Mother   . Hypertension Mother   . Coronary artery disease Mother   . Alcohol abuse Father   . Hypertension Sister    Brandon Peters does not have children. He had two brothers and three sisters. All are living except one of his sisters, Brandon Peters, passed away in her 61's of a heart attack. No cancers in his siblings.   Brandon Peters mother had lung cancer diagnosed at 44 and passed away at 80. She had three brothers. One of Brandon Peters maternal uncles also had lung cancer, diagnosed in his  50's. Both Brandon Peters mother and uncle had smoking history. Brandon Peters does not have information about maternal grandparents or maternal  cousins.  Brandon Peters does not have information about his father. He knows that he had siblings, and that a paternal aunt had cancer, but does not know type. No information about paternal cousins, or grandparents.   Brandon Peters is unaware of previous family history of genetic testing for hereditary cancer risks. Patient's maternal ancestors are of African American descent, and paternal ancestors are of African American/Native American descent. There is no reported Ashkenazi Jewish ancestry. There is no known consanguinity.  GENETIC TEST RESULTS: Genetic testing performed through [facility]'s Multi-Cancer Panel reported out on 08/15/2018 showed no pathogenic mutations. The Multi-Cancer Panel offered by [facility] includes sequencing and/or deletion duplication testing of the following 84 genes: AIP, ALK, APC, ATM, AXIN2,BAP1,  BARD1, BLM, BMPR1A, BRCA1, BRCA2, BRIP1, CASR, CDC73, CDH1, CDK4, CDKN1B, CDKN1C, CDKN2A (p14ARF), CDKN2A (p16INK4a), CEBPA, CHEK2, CTNNA1, DICER1, DIS3L2, EGFR (c.2369C>T, p.Thr790Met variant only), EPCAM (Deletion/duplication testing only), FH, FLCN, GATA2, GPC3, GREM1 (Promoter region deletion/duplication testing only), HOXB13 (c.251G>A, p.Gly84Glu), HRAS, KIT, MAX, MEN1, MET, MITF (c.952G>A, p.Glu318Lys variant only), MLH1, MSH2, MSH3, MSH6, MUTYH, NBN, NF1, NF2, NTHL1, PALB2, PDGFRA, PHOX2B, PMS2, POLD1, POLE, POT1, PRKAR1A, PTCH1, PTEN, RAD50, RAD51C, RAD51D, RB1, RECQL4, RET, RUNX1, SDHAF2, SDHA (sequence changes only), SDHB, SDHC, SDHD, SMAD4, SMARCA4, SMARCB1, SMARCE1, STK11, SUFU, TERC, TERT, TMEM127, TP53, TSC1, TSC2, VHL, WRN and WT1.  A variant of uncertain significance (VUS) in a gene called DICER1 was also noted.   The test report will be scanned into EPIC and will be located under the Molecular Pathology section of the Results Review tab.  A portion of the result report is included below for reference.     We discussed with Brandon Peters that because current genetic  testing is not perfect, it is possible there may be a gene mutation in one of these genes that current testing cannot detect, but that chance is small.  We also discussed, that there could be another gene that has not yet been discovered, or that we have not yet tested, that is responsible for the cancer diagnoses in the family. It is also possible there is a hereditary cause for the cancer in the family that Mr. Hamm did not inherit and therefore was not identified in his testing.  Therefore, it is important to remain in touch with cancer genetics in the future so that we can continue to offer Mr. Nichelson the most up to date genetic testing.   Regarding the VUS in DICER1: At this time, it is unknown if this variant is associated with increased cancer risk or if this is a normal finding, but most variants such as this get reclassified to being inconsequential. It should not be used to make medical management decisions. With time, we suspect the lab will determine the significance of this variant, if any. If we do learn more about it, we will try to contact Brandon Peters to discuss it further. However, it is important to stay in touch with [place] periodically and keep the address and phone number up to date.  ADDITIONAL GENETIC TESTING: We discussed with Brandon Peters that his genetic testing was fairly extensive.  If there are are genes identified to increase cancer risk that can be analyzed in the future, we would be happy to discuss and coordinate this testing at that time.    CANCER SCREENING RECOMMENDATIONS: Brandon Peters test result is considered negative (normal).  This means that we have not identified a hereditary cause for his personal and family history of cancer at this time.   This normal indicates that it is unlikely Brandon Peters has an increased risk of cancer due to a mutation in one of these genes. While reassuring, this does not definitively rule out a hereditary predisposition to cancer. It is still  possible that there could be genetic mutations that are undetectable by current technology, or genetic mutations in genes that have not been tested or identified to increase cancer risk.  Therefore, it is recommended he continue to follow the cancer management and screening guidelines provided by his oncology and primary healthcare provider. An individual's cancer risk is not determined by genetic test results alone.  Overall cancer risk assessment includes additional factors such as personal medical history, family history, etc.  These should be used to make a personalized plan for cancer prevention and surveillance.    RECOMMENDATIONS FOR FAMILY MEMBERS:  Relatives in this family might be at some increased risk of developing cancer, over the general population risk, simply due to the family history of cancer.  We recommended women in this family have a yearly mammogram beginning at age 74, or 58 years younger than the earliest onset of cancer, an annual clinical breast exam, and perform monthly breast self-exams. Women in this family should also have a gynecological exam as recommended by their primary provider. All family members should have a colonoscopy as directed by their doctors.  All family members should inform their physicians about the family history of cancer so their doctors can make the most appropriate screening recommendations for them.   FOLLOW-UP: Lastly, we discussed  with Brandon Peters that cancer genetics is a rapidly advancing field and it is possible that new genetic tests will be appropriate for him and/or his family members in the future. We encouraged him to remain in contact with cancer genetics on an annual basis so we can update his personal and family histories and let him know of advances in cancer genetics that may benefit this family.   Our contact number was provided. Mr. Berkel questions were answered to his satisfaction, and he knows he is welcome to call us at anytime with  additional questions or concerns.  Faith Rogue, MS Genetic Counselor [email or web address] Phone: [phone]

## 2018-08-27 ENCOUNTER — Other Ambulatory Visit: Payer: Self-pay | Admitting: Oncology

## 2018-08-27 DIAGNOSIS — C179 Malignant neoplasm of small intestine, unspecified: Secondary | ICD-10-CM

## 2018-08-28 MED FILL — XELODA 500 MG TABLET: 500 | 21 days supply | Qty: 98 | Fill #0

## 2018-08-30 ENCOUNTER — Inpatient Hospital Stay: Payer: Self-pay | Attending: Nurse Practitioner

## 2018-08-30 ENCOUNTER — Inpatient Hospital Stay (HOSPITAL_BASED_OUTPATIENT_CLINIC_OR_DEPARTMENT_OTHER): Payer: Self-pay | Admitting: Nurse Practitioner

## 2018-08-30 ENCOUNTER — Other Ambulatory Visit: Payer: Self-pay | Admitting: *Deleted

## 2018-08-30 ENCOUNTER — Telehealth: Payer: Self-pay

## 2018-08-30 ENCOUNTER — Inpatient Hospital Stay: Payer: Self-pay

## 2018-08-30 VITALS — HR 99

## 2018-08-30 VITALS — BP 142/84 | HR 107 | Temp 97.5°F | Resp 18 | Ht 65.0 in | Wt 148.8 lb

## 2018-08-30 DIAGNOSIS — Q5320 Undescended testicle, unspecified, bilateral: Secondary | ICD-10-CM

## 2018-08-30 DIAGNOSIS — C7951 Secondary malignant neoplasm of bone: Secondary | ICD-10-CM | POA: Insufficient documentation

## 2018-08-30 DIAGNOSIS — Z86010 Personal history of colonic polyps: Secondary | ICD-10-CM | POA: Insufficient documentation

## 2018-08-30 DIAGNOSIS — N2889 Other specified disorders of kidney and ureter: Secondary | ICD-10-CM | POA: Insufficient documentation

## 2018-08-30 DIAGNOSIS — Z931 Gastrostomy status: Secondary | ICD-10-CM

## 2018-08-30 DIAGNOSIS — C170 Malignant neoplasm of duodenum: Secondary | ICD-10-CM

## 2018-08-30 DIAGNOSIS — C61 Malignant neoplasm of prostate: Secondary | ICD-10-CM

## 2018-08-30 DIAGNOSIS — L271 Localized skin eruption due to drugs and medicaments taken internally: Secondary | ICD-10-CM | POA: Insufficient documentation

## 2018-08-30 DIAGNOSIS — D649 Anemia, unspecified: Secondary | ICD-10-CM | POA: Insufficient documentation

## 2018-08-30 DIAGNOSIS — E119 Type 2 diabetes mellitus without complications: Secondary | ICD-10-CM

## 2018-08-30 DIAGNOSIS — C179 Malignant neoplasm of small intestine, unspecified: Secondary | ICD-10-CM

## 2018-08-30 LAB — CBC WITH DIFFERENTIAL (CANCER CENTER ONLY)
ABS IMMATURE GRANULOCYTES: 0.16 10*3/uL — AB (ref 0.00–0.07)
BASOS PCT: 0 %
Basophils Absolute: 0 10*3/uL (ref 0.0–0.1)
EOS ABS: 0 10*3/uL (ref 0.0–0.5)
EOS PCT: 0 %
HCT: 27.5 % — ABNORMAL LOW (ref 39.0–52.0)
Hemoglobin: 8.7 g/dL — ABNORMAL LOW (ref 13.0–17.0)
Immature Granulocytes: 2 %
Lymphocytes Relative: 17 %
Lymphs Abs: 1.5 10*3/uL (ref 0.7–4.0)
MCH: 30 pg (ref 26.0–34.0)
MCHC: 31.6 g/dL (ref 30.0–36.0)
MCV: 94.8 fL (ref 80.0–100.0)
MONOS PCT: 7 %
Monocytes Absolute: 0.6 10*3/uL (ref 0.1–1.0)
Neutro Abs: 6.7 10*3/uL (ref 1.7–7.7)
Neutrophils Relative %: 74 %
PLATELETS: 280 10*3/uL (ref 150–400)
RBC: 2.9 MIL/uL — ABNORMAL LOW (ref 4.22–5.81)
RDW: 17.2 % — AB (ref 11.5–15.5)
WBC Count: 9.1 10*3/uL (ref 4.0–10.5)
nRBC: 0 % (ref 0.0–0.2)

## 2018-08-30 LAB — CMP (CANCER CENTER ONLY)
ALT: 28 U/L (ref 0–44)
AST: 15 U/L (ref 15–41)
Albumin: 3.5 g/dL (ref 3.5–5.0)
Alkaline Phosphatase: 92 U/L (ref 38–126)
Anion gap: 16 — ABNORMAL HIGH (ref 5–15)
BUN: 23 mg/dL (ref 8–23)
CALCIUM: 9.9 mg/dL (ref 8.9–10.3)
CHLORIDE: 97 mmol/L — AB (ref 98–111)
CO2: 21 mmol/L — ABNORMAL LOW (ref 22–32)
CREATININE: 0.99 mg/dL (ref 0.61–1.24)
GFR, Est AFR Am: >60 mL/min (ref >60)
GFR, Estimated: >60 mL/min (ref >60)
Glucose, Bld: 265 mg/dL — ABNORMAL HIGH (ref 70–99)
Potassium: 3.3 mmol/L — ABNORMAL LOW (ref 3.5–5.1)
Sodium: 134 mmol/L — ABNORMAL LOW (ref 135–145)
Total Bilirubin: 0.5 mg/dL (ref 0.3–1.2)
Total Protein: 6.8 g/dL (ref 6.5–8.1)

## 2018-08-30 MED ORDER — PREDNISONE 5 MG PO TABS: ORAL_TABLET | ORAL | 1 refills | Status: DC

## 2018-08-30 MED ORDER — ZOLEDRONIC ACID 4 MG/100ML IV SOLN
4.0000 mg | Freq: Once | INTRAVENOUS | Status: AC
  Administered 2018-08-30: 4 mg via INTRAVENOUS
  Filled 2018-08-30: qty 100

## 2018-08-30 MED ORDER — SODIUM CHLORIDE 0.9 % IV SOLN
Freq: Once | INTRAVENOUS | Status: AC
  Administered 2018-08-30: 15:00:00 via INTRAVENOUS
  Filled 2018-08-30: qty 250

## 2018-08-30 MED FILL — predniSONE 5 MG TABS: 5 | 90 days supply | Qty: 180 | Fill #0

## 2018-08-30 NOTE — Telephone Encounter (Signed)
Call from [facility] that patient is requesting refill on his Prednisone and expects to pick up script today.

## 2018-08-30 NOTE — Patient Instructions (Signed)

## 2018-08-30 NOTE — Telephone Encounter (Signed)
Printed avs and calender of upcoming appointment. Per 12/5 los 

## 2018-08-30 NOTE — Progress Notes (Addendum)
[place] OFFICE PROGRESS NOTE   Diagnosis: Prostate cancer, small bowel carcinoma  INTERVAL HISTORY:   Brandon Peters returns as scheduled.  He completed cycle 2 adjuvant Xeloda beginning 08/13/2018.  He denies nausea/vomiting.  No mouth sores.  He had a loose stool on day 8 and 2 days this week.  He notes the hands are dark, dry, uncomfortable.  He has a "crack" on the right thumb.  No significant issues with the feet.  Objective:  Vital signs in last 24 hours:  Blood pressure (!) 142/84, pulse (!) 107, temperature (!) 97.5 F (36.4 C), temperature source Oral, resp. rate 18, height '5\' 5"'  (1.651 m), weight 148 lb 12.8 oz (67.5 kg), SpO2 100 %.    HEENT: No thrush or ulcers. Resp: Lungs clear bilaterally. Cardio: Regular rate and rhythm. GI: Abdomen soft and nontender.  No hepatomegaly. Vascular: No leg edema.  Skin: Palms with hyperpigmentation, dryness.  The right hand appears to be more affected than the left hand.  Soles with marked dry desquamation.   Lab Results:  Lab Results  Component Value Date   WBC 9.1 08/30/2018   HGB 8.7 (L) 08/30/2018   HCT 27.5 (L) 08/30/2018   MCV 94.8 08/30/2018   PLT 280 08/30/2018   NEUTROABS 6.7 08/30/2018    Imaging:  No results found.  Medications: I have reviewed the patient's current medications.  Assessment/Plan: 1. History of Severe anemia-status post a red cell transfusion 03/04/2016 2.. Prostate cancer  Extensive sclerotic bone metastases noted on a CT of the abdomen/pelvis 03/04/2016 and MRI abdomen 03/05/2016  Markedly elevated PSA  Lupron initiated 03/15/2016; Casodex 14 days beginning 03/15/2016  Prostate biopsy 03/18/2016  05/09/2016 PSA significantly improved at 21  Abirateroneinitiated08/15/2017  PSA less than 0.1 07/10/2018  3. Symptoms of obstructive uropathy. Improved .  4. Distal duodenal and descending colon masses  biopsy of the duodenal mass 03/04/2016 confirmed  fragments of small bowel mucosa with focal high-grade dysplasia  Biopsy of the descending colon "polyps" revealed fragments of a tubulovillous adenoma  CT 08/29/2017-similar soft tissue thickening in the region of the splenic flexure of the colon suboptimally evaluated due to lack of colonic enteric contrast  Referred to Dr. Benson Norway  Referred to [facility]  Resection of descending colon polyps 01/31/2018, tubulovillous adenomas with high-grade dysplasia  Attempted endoscopic resection of the fourth segment duodenal "polyp"on 05/09/2018, firm unresectable lesion, biopsy revealed moderately differentiated adenocarcinoma  CT abdomen/pelvis 05/25/2018- heterogeneously enhancing right lower pole renal lesion; diffuse sclerotic osseous metastatic lesions compatible with history of prostate cancer  CT chest 05/25/2018- no pulmonary metastasis. Diffuse osseous metastasis involving the axial and appendicular skeleton.  06/19/2018-sleeve resection of third and fourth portions of duodenum with primary end-to-end anastomosis, feeding gastrojejunostomy tube placement, Dr. Stark Klein differentiated adenocarcinoma duodenum, 3 cm;tumor invades the muscularis propria; lymphovascular invasion present; negative margins; 4 of 11 involved lymph nodes;soft tissue deposits also seen in the mesentery; all mucosal margins and soft tissue margins negative but 2 of the positive nodes are found in the soft tissue margin submitted for frozen section(pT2 pN2); second mucosal lesion 1.2 cm from the proximal margin, tubular adenoma with focal severe dysplasia  Intact expression of mismatch repair proteins by IHC; microsatellite stable  Cycle 1 adjuvant Xeloda 07/23/2018  Cycle 2 adjuvant Xeloda 08/13/2018  5. Anorexia/weight loss-resolved  6. Undescended testicles  7. Right renal mass. CT 08/29/2017-right renal mass involving the lower pole was partially calcified and does demonstrate enhancement following contrast most  consistent with renal  cell carcinoma. Lesion not significantly enlarged compared with the prior CT. Followed by urology.  8. Diabetes  Disposition: Brandon Peters has completed 2 cycles of adjuvant Xeloda.  He has developed significant skin changes over the hands and feet.  We decided to delay the next cycle of adjuvant Xeloda until reevaluation in approximately 1 week.  He is scheduled to receive Zometa today.  He will return for a follow-up visit on 09/07/2018.  He will contact the office in the interim with any problems.  Patient seen with Dr. Benay Spice.    Ned Card ANP/GNP-BC   08/30/2018  2:08 PM This was a shared visit with Ned Card.  Brandon Peters was interviewed and examined.  He has developed hand/foot syndrome secondary to Xeloda.  Xeloda will be placed on hold.  He will return for an office visit in 1 week.  Julieanne Manson, MD

## 2018-08-31 LAB — PROSTATE-SPECIFIC AG, SERUM (LABCORP): Prostate Specific Ag, Serum: <0.1 ng/mL (ref 0.0–4.0)

## 2018-09-07 ENCOUNTER — Telehealth: Payer: Self-pay | Admitting: Nurse Practitioner

## 2018-09-07 ENCOUNTER — Encounter: Payer: Self-pay | Admitting: Nurse Practitioner

## 2018-09-07 ENCOUNTER — Inpatient Hospital Stay (HOSPITAL_BASED_OUTPATIENT_CLINIC_OR_DEPARTMENT_OTHER): Payer: Self-pay | Admitting: Nurse Practitioner

## 2018-09-07 VITALS — BP 141/84 | HR 99 | Temp 97.9°F | Resp 18 | Ht 65.0 in | Wt 151.5 lb

## 2018-09-07 DIAGNOSIS — Z86010 Personal history of colonic polyps: Secondary | ICD-10-CM

## 2018-09-07 DIAGNOSIS — C179 Malignant neoplasm of small intestine, unspecified: Secondary | ICD-10-CM

## 2018-09-07 DIAGNOSIS — N2889 Other specified disorders of kidney and ureter: Secondary | ICD-10-CM

## 2018-09-07 DIAGNOSIS — C61 Malignant neoplasm of prostate: Secondary | ICD-10-CM

## 2018-09-07 DIAGNOSIS — C7951 Secondary malignant neoplasm of bone: Secondary | ICD-10-CM

## 2018-09-07 DIAGNOSIS — D649 Anemia, unspecified: Secondary | ICD-10-CM

## 2018-09-07 DIAGNOSIS — Q5320 Undescended testicle, unspecified, bilateral: Secondary | ICD-10-CM

## 2018-09-07 DIAGNOSIS — L271 Localized skin eruption due to drugs and medicaments taken internally: Secondary | ICD-10-CM

## 2018-09-07 DIAGNOSIS — Z931 Gastrostomy status: Secondary | ICD-10-CM

## 2018-09-07 DIAGNOSIS — C170 Malignant neoplasm of duodenum: Secondary | ICD-10-CM

## 2018-09-07 DIAGNOSIS — E119 Type 2 diabetes mellitus without complications: Secondary | ICD-10-CM

## 2018-09-07 NOTE — Progress Notes (Addendum)
[place] OFFICE PROGRESS NOTE   Diagnosis: Prostate cancer, small bowel carcinoma  INTERVAL HISTORY:   Brandon Peters returns as scheduled.  He completed cycle 2 adjuvant Xeloda beginning 08/13/2018.  At time of routine follow-up 08/30/2018 he was noted to have developed significant skin changes over the hands and feet.  Cycle 3 was placed on hold until today's reevaluation.  He reports hands and feet are better.  He is applying Vaseline liberally.  No diarrhea this week.  No mouth sores.  He has a good appetite.  He is gaining weight.  Objective:  Vital signs in last 24 hours:  Blood pressure (!) 141/84, pulse 99, temperature 97.9 F (36.6 C), temperature source Oral, resp. rate 18, height '5\' 5"'  (1.651 m), weight 151 lb 8 oz (68.7 kg), SpO2 100 %.    HEENT: No thrush or ulcers. Resp: Lungs clear bilaterally. Cardio: Regular rate and rhythm. GI: Abdomen soft and nontender.  No hepatomegaly. Vascular: Trace edema at the lower legs bilaterally.  Skin: Palms with hyperpigmentation, mild dryness.  Changes over the right hand more significant than the left hand.  Soles appear significantly improved, now with mild dryness.   Lab Results:  Lab Results  Component Value Date   WBC 9.1 08/30/2018   HGB 8.7 (L) 08/30/2018   HCT 27.5 (L) 08/30/2018   MCV 94.8 08/30/2018   PLT 280 08/30/2018   NEUTROABS 6.7 08/30/2018    Imaging:  No results found.  Medications: I have reviewed the patient's current medications.  Assessment/Plan: 1. History of Severe anemia-status post a red cell transfusion 03/04/2016 2.. Prostate cancer  Extensive sclerotic bone metastases noted on a CT of the abdomen/pelvis 03/04/2016 and MRI abdomen 03/05/2016  Markedly elevated PSA  Lupron initiated 03/15/2016; Casodex 14 days beginning 03/15/2016  Prostate biopsy 03/18/2016  05/09/2016 PSA significantly improved at 21  Abirateroneinitiated08/15/2017  PSA less than 0.1  07/10/2018  3. Symptoms of obstructive uropathy. Improved .  4. Distal duodenal and descending colon masses  biopsy of the duodenal mass 03/04/2016 confirmed fragments of small bowel mucosa with focal high-grade dysplasia  Biopsy of the descending colon "polyps" revealed fragments of a tubulovillous adenoma  CT 08/29/2017-similar soft tissue thickening in the region of the splenic flexure of the colon suboptimally evaluated due to lack of colonic enteric contrast  Referred to Dr. Benson Norway  Referred to [facility]  Resection of descending colon polyps 01/31/2018, tubulovillous adenomas with high-grade dysplasia  Attempted endoscopic resection of the fourth segment duodenal "polyp"on 05/09/2018, firm unresectable lesion, biopsy revealed moderately differentiated adenocarcinoma  CT abdomen/pelvis 05/25/2018-heterogeneously enhancing right lower pole renal lesion; diffuse sclerotic osseous metastatic lesions compatible with history of prostate cancer  CT chest 05/25/2018-no pulmonary metastasis. Diffuse osseous metastasis involving the axial and appendicular skeleton.  06/19/2018-sleeve resection of third and fourth portions of duodenum with primary end-to-end anastomosis, feeding gastrojejunostomy tube placement, Dr. Stark Klein differentiated adenocarcinoma duodenum, 3 cm;tumor invades the muscularis propria; lymphovascular invasion present; negative margins; 4 of 11 involved lymph nodes;soft tissue deposits also seen in the mesentery; all mucosal margins and soft tissue margins negative but 2 of the positive nodes are found in the soft tissue margin submitted for frozen section(pT2 pN2); second mucosal lesion 1.2 cm from the proximal margin, tubular adenoma with focal severe dysplasia  Intact expression of mismatch repair proteins by IHC; microsatellite stable  Cycle 1 adjuvant Xeloda 07/23/2018  Cycle 2 adjuvant Xeloda 08/13/2018  Cycle 3 adjuvant Xeloda 09/10/2018 (dose reduced to 1000  mg twice daily  for 14 days)  5. Anorexia/weight loss-resolved  6. Undescended testicles  7. Right renal mass. CT 08/29/2017-right renal mass involving the lower pole was partially calcified and does demonstrate enhancement following contrast most consistent with renal cell carcinoma. Lesion not significantly enlarged compared with the prior CT. Followed by urology.  8. Diabetes   Disposition: Brandon Peters appears stable.  He has completed 2 cycles of adjuvant Xeloda.  Cycle 3 was held last week due to hand-foot syndrome.  Symptoms have improved significantly.  Plan to proceed with cycle 3 at a reduced dose of 1000 mg twice daily for 14 days beginning 09/10/2018.  If symptoms recur he will discontinue Xeloda and contact the office.  He will return for lab and follow-up on 09/28/2017.  We again reviewed the labs from 08/30/2018.  We discussed signs/symptoms suggestive of progressive anemia.  He understands to contact the office should he develop these.  Patient seen with Dr. Benay Spice.  Ned Card ANP/GNP-BC   09/07/2018  12:08 PM  This was a shared visit with Ned Card.  Brandon Peters was interviewed and examined.  The hand-foot symptoms have improved.  The plan is to resume Xeloda at a reduced dose.  Julieanne Manson, MD

## 2018-09-07 NOTE — Telephone Encounter (Signed)
Printed calendar and avs. °

## 2018-09-28 ENCOUNTER — Inpatient Hospital Stay: Payer: Self-pay | Attending: Nurse Practitioner

## 2018-09-28 ENCOUNTER — Telehealth: Payer: Self-pay

## 2018-09-28 ENCOUNTER — Other Ambulatory Visit: Payer: Self-pay | Admitting: *Deleted

## 2018-09-28 ENCOUNTER — Inpatient Hospital Stay (HOSPITAL_BASED_OUTPATIENT_CLINIC_OR_DEPARTMENT_OTHER): Payer: Self-pay | Admitting: Nurse Practitioner

## 2018-09-28 ENCOUNTER — Encounter: Payer: Self-pay | Admitting: Nurse Practitioner

## 2018-09-28 VITALS — BP 156/85 | HR 100 | Temp 97.7°F | Resp 17 | Ht 60.0 in | Wt 154.3 lb

## 2018-09-28 DIAGNOSIS — C7951 Secondary malignant neoplasm of bone: Secondary | ICD-10-CM | POA: Insufficient documentation

## 2018-09-28 DIAGNOSIS — C179 Malignant neoplasm of small intestine, unspecified: Secondary | ICD-10-CM

## 2018-09-28 DIAGNOSIS — C61 Malignant neoplasm of prostate: Secondary | ICD-10-CM | POA: Insufficient documentation

## 2018-09-28 DIAGNOSIS — N2889 Other specified disorders of kidney and ureter: Secondary | ICD-10-CM | POA: Insufficient documentation

## 2018-09-28 DIAGNOSIS — Z86010 Personal history of colonic polyps: Secondary | ICD-10-CM

## 2018-09-28 DIAGNOSIS — C170 Malignant neoplasm of duodenum: Secondary | ICD-10-CM | POA: Insufficient documentation

## 2018-09-28 DIAGNOSIS — Q539 Undescended testicle, unspecified: Secondary | ICD-10-CM

## 2018-09-28 DIAGNOSIS — E119 Type 2 diabetes mellitus without complications: Secondary | ICD-10-CM | POA: Insufficient documentation

## 2018-09-28 LAB — CMP (CANCER CENTER ONLY)
ALT: 32 U/L (ref 0–44)
AST: 16 U/L (ref 15–41)
Albumin: 3.4 g/dL — ABNORMAL LOW (ref 3.5–5.0)
Alkaline Phosphatase: 99 U/L (ref 38–126)
Anion gap: 12 (ref 5–15)
BUN: 20 mg/dL (ref 8–23)
CO2: 26 mmol/L (ref 22–32)
Calcium: 9.5 mg/dL (ref 8.9–10.3)
Chloride: 98 mmol/L (ref 98–111)
Creatinine: 0.92 mg/dL (ref 0.61–1.24)
GFR, Estimated: >60 mL/min (ref >60)
Glucose, Bld: 245 mg/dL — ABNORMAL HIGH (ref 70–99)
Potassium: 3.3 mmol/L — ABNORMAL LOW (ref 3.5–5.1)
Sodium: 136 mmol/L (ref 135–145)
Total Bilirubin: 0.6 mg/dL (ref 0.3–1.2)
Total Protein: 6.9 g/dL (ref 6.5–8.1)

## 2018-09-28 LAB — SAMPLE TO BLOOD BANK

## 2018-09-28 LAB — CBC WITH DIFFERENTIAL (CANCER CENTER ONLY)
Abs Immature Granulocytes: 0.06 10*3/uL (ref 0.00–0.07)
Basophils Absolute: 0 10*3/uL (ref 0.0–0.1)
Basophils Relative: 0 %
Eosinophils Absolute: 0.1 10*3/uL (ref 0.0–0.5)
Eosinophils Relative: 1 %
HCT: 30.2 % — ABNORMAL LOW (ref 39.0–52.0)
HEMOGLOBIN: 9.2 g/dL — AB (ref 13.0–17.0)
Immature Granulocytes: 1 %
Lymphocytes Relative: 13 %
Lymphs Abs: 1 10*3/uL (ref 0.7–4.0)
MCH: 31.5 pg (ref 26.0–34.0)
MCHC: 30.5 g/dL (ref 30.0–36.0)
MCV: 103.4 fL — ABNORMAL HIGH (ref 80.0–100.0)
Monocytes Absolute: 0.6 10*3/uL (ref 0.1–1.0)
Monocytes Relative: 8 %
Neutro Abs: 6.2 10*3/uL (ref 1.7–7.7)
Neutrophils Relative %: 77 %
Platelet Count: 263 10*3/uL (ref 150–400)
RBC: 2.92 MIL/uL — AB (ref 4.22–5.81)
RDW: 18.9 % — ABNORMAL HIGH (ref 11.5–15.5)
WBC Count: 8 10*3/uL (ref 4.0–10.5)
nRBC: 0 % (ref 0.0–0.2)

## 2018-09-28 MED ORDER — CAPECITABINE 500 MG PO TABS: ORAL_TABLET | ORAL | 0 refills | Status: DC

## 2018-09-28 NOTE — Progress Notes (Signed)
[place] OFFICE PROGRESS NOTE   Diagnosis: Prostate cancer, small bowel carcinoma  INTERVAL HISTORY:   [name] returns as scheduled.  He completed cycle 3 adjuvant Xeloda beginning 09/10/2018.  The dose was reduced due to hand-foot syndrome.  He denies nausea/vomiting.  He had 2 loose stools on day 1 or 2, none since.  No mouth sores.  He continues to note dryness on the hands and feet.  No redness or pain.  No significant change in the hands or feet with the most recent cycle of Xeloda.  Objective:  Vital signs in last 24 hours:  Blood pressure (!) 156/85, pulse 100, temperature 97.7 F (36.5 C), temperature source Oral, resp. rate 17, height 5' (1.524 m), weight 154 lb 4.8 oz (70 kg), head circumference 5" (12.7 cm), SpO2 100 %.    HEENT: No thrush or ulcers. Resp: Lungs clear bilaterally. Cardio: Regular rate and rhythm. GI: Abdomen soft and nontender.  No hepatomegaly. Vascular: Trace edema at the lower legs bilaterally.  Skin: Palms with hyperpigmentation; dryness right hand greater than the left hand.  Soles dry appearing.   Lab Results:  Lab Results  Component Value Date   WBC 8.0 09/28/2018   HGB 9.2 (L) 09/28/2018   HCT 30.2 (L) 09/28/2018   MCV 103.4 (H) 09/28/2018   PLT 263 09/28/2018   NEUTROABS 6.2 09/28/2018    Imaging:  No results found.  Medications: I have reviewed the patient's current medications.  Assessment/Plan: 1. History of Severe anemia-status post a red cell transfusion 03/04/2016 2.. Prostate cancer  Extensive sclerotic bone metastases noted on a CT of the abdomen/pelvis 03/04/2016 and MRI abdomen 03/05/2016  Markedly elevated PSA  Lupron initiated 03/15/2016; Casodex 14 days beginning 03/15/2016  Prostate biopsy 03/18/2016  05/09/2016 PSA significantly improved at 21  Abirateroneinitiated08/15/2017  PSA less than 0.1 07/10/2018  PSA less than 0.1 08/30/2018  3. Symptoms of obstructive uropathy.  Improved   4. Distal duodenal and descending colon masses  biopsy of the duodenal mass 03/04/2016 confirmed fragments of small bowel mucosa with focal high-grade dysplasia  Biopsy of the descending colon "polyps" revealed fragments of a tubulovillous adenoma  CT 08/29/2017-similar soft tissue thickening in the region of the splenic flexure of the colon suboptimally evaluated due to lack of colonic enteric contrast  Referred to Dr. Benson Norway  Referred to [facility]  Resection of descending colon polyps 01/31/2018, tubulovillous adenomas with high-grade dysplasia  Attempted endoscopic resection of the fourth segment duodenal "polyp"on 05/09/2018, firm unresectable lesion, biopsy revealed moderately differentiated adenocarcinoma  CT abdomen/pelvis 05/25/2018-heterogeneously enhancing right lower pole renal lesion; diffuse sclerotic osseous metastatic lesions compatible with history of prostate cancer  CT chest 05/25/2018-no pulmonary metastasis. Diffuse osseous metastasis involving the axial and appendicular skeleton.  06/19/2018-sleeve resection of third and fourth portions of duodenum with primary end-to-end anastomosis, feeding gastrojejunostomy tube placement, Dr. Stark Klein differentiated adenocarcinoma duodenum, 3 cm;tumor invades the muscularis propria; lymphovascular invasion present; negative margins; 4 of 11 involved lymph nodes;soft tissue deposits also seen in the mesentery; all mucosal margins and soft tissue margins negative but 2 of the positive nodes are found in the soft tissue margin submitted for frozen section(pT2 pN2); second mucosal lesion 1.2 cm from the proximal margin, tubular adenoma with focal severe dysplasia  Intact expression of mismatch repair proteins by IHC; microsatellite stable  Cycle 1 adjuvant Xeloda 07/23/2018  Cycle 2 adjuvant Xeloda 08/13/2018  Cycle 3 adjuvant Xeloda 09/10/2018 (dose reduced to 1000 mg twice daily for 14 days)  Cycle 4 adjuvant Xeloda  10/01/2018 (dose increased to 1500 mg every morning and 1000 mg every afternoon for 14 days)  5. Anorexia/weight loss-resolved  6. Undescended testicles  7. Right renal mass. CT 08/29/2017-right renal mass involving the lower pole was partially calcified and does demonstrate enhancement following contrast most consistent with renal cell carcinoma. Lesion not significantly enlarged compared with the prior CT. Followed by urology.  8. Diabetes  Disposition: Brandon Peters appears stable.  He has completed 3 cycles of adjuvant Xeloda.  He tolerated cycle 3 without significant toxicity.  Plan to proceed with cycle 4 as scheduled beginning 10/01/2018.  The Xeloda dose will be increased to 1500 mg every morning and 1000 mg every afternoon for 14 days.  He understands to discontinue Xeloda and contact the office with worsening symptoms of hand-foot syndrome or any other issues such as diarrhea.  He will return for lab and follow-up in 3 weeks.  He will contact the office in the interim as outlined above or with any other problems.  Plan reviewed with Dr. Benay Spice.    Brandon Peters ANP/GNP-BC   09/28/2018  10:05 AM

## 2018-09-28 NOTE — Telephone Encounter (Signed)
Printed avs and calender of upcoming appointment. Per 1/3 los 

## 2018-09-29 LAB — PROSTATE-SPECIFIC AG, SERUM (LABCORP): Prostate Specific Ag, Serum: <0.1 ng/mL (ref 0.0–4.0)

## 2018-10-03 ENCOUNTER — Telehealth: Payer: Self-pay | Admitting: *Deleted

## 2018-10-03 MED ORDER — POTASSIUM CHLORIDE CRYS ER 20 MEQ PO TBCR: 20.0000 meq | EXTENDED_RELEASE_TABLET | Freq: Two times a day (BID) | ORAL | 0 refills | Status: DC

## 2018-10-03 NOTE — Telephone Encounter (Signed)
Please have him increase K-Dur to 20 mEq daily.

## 2018-10-03 NOTE — Telephone Encounter (Signed)
Brandon Peters states he is taking potassium 10 meq daily.

## 2018-10-03 NOTE — Telephone Encounter (Signed)
Notified to increase to 20 meq daily.  New prescription sent to pharmacy

## 2018-10-03 NOTE — Telephone Encounter (Signed)
-----   Message from Owens Shark, NP sent at 09/28/2018 12:29 PM EST ----- Is he taking potassium?  If so what is the dose?

## 2018-10-08 MED FILL — XELODA 500 MG TABLET: 500 | 21 days supply | Qty: 70 | Fill #0

## 2018-10-18 ENCOUNTER — Inpatient Hospital Stay (HOSPITAL_BASED_OUTPATIENT_CLINIC_OR_DEPARTMENT_OTHER): Payer: Self-pay | Admitting: Oncology

## 2018-10-18 ENCOUNTER — Telehealth: Payer: Self-pay

## 2018-10-18 ENCOUNTER — Inpatient Hospital Stay: Payer: Self-pay

## 2018-10-18 VITALS — BP 144/84 | HR 99 | Temp 97.3°F | Resp 18 | Ht 60.0 in | Wt 153.6 lb

## 2018-10-18 DIAGNOSIS — L271 Localized skin eruption due to drugs and medicaments taken internally: Secondary | ICD-10-CM

## 2018-10-18 DIAGNOSIS — C179 Malignant neoplasm of small intestine, unspecified: Secondary | ICD-10-CM

## 2018-10-18 DIAGNOSIS — Q539 Undescended testicle, unspecified: Secondary | ICD-10-CM

## 2018-10-18 DIAGNOSIS — C61 Malignant neoplasm of prostate: Secondary | ICD-10-CM

## 2018-10-18 DIAGNOSIS — N289 Disorder of kidney and ureter, unspecified: Secondary | ICD-10-CM

## 2018-10-18 DIAGNOSIS — E119 Type 2 diabetes mellitus without complications: Secondary | ICD-10-CM

## 2018-10-18 DIAGNOSIS — C7951 Secondary malignant neoplasm of bone: Secondary | ICD-10-CM

## 2018-10-18 LAB — CBC WITH DIFFERENTIAL (CANCER CENTER ONLY)
Abs Immature Granulocytes: 0.12 K/uL — ABNORMAL HIGH (ref 0.00–0.07)
Basophils Absolute: 0 K/uL (ref 0.0–0.1)
Basophils Relative: 0 %
Eosinophils Absolute: 0 K/uL (ref 0.0–0.5)
Eosinophils Relative: 0 %
HCT: 29.6 % — ABNORMAL LOW (ref 39.0–52.0)
Hemoglobin: 9.3 g/dL — ABNORMAL LOW (ref 13.0–17.0)
Immature Granulocytes: 1 %
Lymphocytes Relative: 12 %
Lymphs Abs: 1 K/uL (ref 0.7–4.0)
MCH: 32.3 pg (ref 26.0–34.0)
MCHC: 31.4 g/dL (ref 30.0–36.0)
MCV: 102.8 fL — ABNORMAL HIGH (ref 80.0–100.0)
Monocytes Absolute: 0.7 K/uL (ref 0.1–1.0)
Monocytes Relative: 8 %
Neutro Abs: 6.5 K/uL (ref 1.7–7.7)
Neutrophils Relative %: 79 %
Platelet Count: 261 K/uL (ref 150–400)
RBC: 2.88 MIL/uL — ABNORMAL LOW (ref 4.22–5.81)
RDW: 17.5 % — ABNORMAL HIGH (ref 11.5–15.5)
WBC Count: 8.3 K/uL (ref 4.0–10.5)
nRBC: 0 % (ref 0.0–0.2)

## 2018-10-18 LAB — CMP (CANCER CENTER ONLY)
ALK PHOS: 93 U/L (ref 38–126)
ALT: 35 U/L (ref 0–44)
ANION GAP: 13 (ref 5–15)
AST: 22 U/L (ref 15–41)
Albumin: 3.5 g/dL (ref 3.5–5.0)
BUN: 24 mg/dL — ABNORMAL HIGH (ref 8–23)
CO2: 26 mmol/L (ref 22–32)
Calcium: 9.7 mg/dL (ref 8.9–10.3)
Chloride: 97 mmol/L — ABNORMAL LOW (ref 98–111)
Creatinine: 1.05 mg/dL (ref 0.61–1.24)
GFR, Est AFR Am: >60 mL/min (ref >60)
GFR, Estimated: >60 mL/min (ref >60)
Glucose, Bld: 238 mg/dL — ABNORMAL HIGH (ref 70–99)
Potassium: 3.6 mmol/L (ref 3.5–5.1)
Sodium: 136 mmol/L (ref 135–145)
Total Bilirubin: 0.5 mg/dL (ref 0.3–1.2)
Total Protein: 6.9 g/dL (ref 6.5–8.1)

## 2018-10-18 MED ORDER — POTASSIUM CHLORIDE CRYS ER 20 MEQ PO TBCR: 20.0000 meq | EXTENDED_RELEASE_TABLET | Freq: Every day | ORAL | 1 refills | Status: DC

## 2018-10-18 NOTE — Progress Notes (Signed)
[place] OFFICE PROGRESS NOTE   Diagnosis: Prostate cancer, small bowel carcinoma  INTERVAL HISTORY:   Brandon Peters began another cycle of adjuvant Xeloda on 10/01/2018.  Mouth sores or hand/foot pain.  He reports diarrhea once per week.  He continues Zytiga and prednisone.  Objective:  Vital signs in last 24 hours:  Blood pressure (!) 144/84, pulse 99, temperature (!) 97.3 F (36.3 C), temperature source Oral, resp. rate 18, height 5' (1.524 m), weight 153 lb 9.6 oz (69.7 kg), SpO2 99 %.    HEENT: No thrush or ulcers Resp: End inspiratory rhonchi at the right posterior base, no respiratory distress Cardio: Regular rate and rhythm GI: No hepatosplenomegaly Vascular: Trace pitting edema at the low pretibial area bilaterally  Skin: Dryness, skin thickening and superficial desquamation at the hands and feet, mild erythema at the soles, one linear ulcer a thumb  Portacath/PICC-without erythema  Lab Results:  Lab Results  Component Value Date   WBC 8.3 10/18/2018   HGB 9.3 (L) 10/18/2018   HCT 29.6 (L) 10/18/2018   MCV 102.8 (H) 10/18/2018   PLT 261 10/18/2018   NEUTROABS 6.5 10/18/2018    CMP  Lab Results  Component Value Date   NA 136 10/18/2018   K 3.6 10/18/2018   CL 97 (L) 10/18/2018   CO2 26 10/18/2018   GLUCOSE 238 (H) 10/18/2018   BUN 24 (H) 10/18/2018   CREATININE 1.05 10/18/2018   CALCIUM 9.7 10/18/2018   PROT 6.9 10/18/2018   ALBUMIN 3.5 10/18/2018   AST 22 10/18/2018   ALT 35 10/18/2018   ALKPHOS 93 10/18/2018   BILITOT 0.5 10/18/2018   GFRNONAA >60 10/18/2018   GFRAA >60 10/18/2018    Medications: I have reviewed the patient's current medications.   Assessment/Plan: 1. History of Severe anemia-status post a red cell transfusion 03/04/2016 2.. Prostate cancer  Extensive sclerotic bone metastases noted on a CT of the abdomen/pelvis 03/04/2016 and MRI abdomen 03/05/2016  Markedly elevated PSA  Lupron initiated 03/15/2016;  Casodex 14 days beginning 03/15/2016  Prostate biopsy 03/18/2016  05/09/2016 PSA significantly improved at 21  Abirateroneinitiated08/15/2017  PSA less than 0.1 07/10/2018  PSA less than 0.1 08/30/2018  3. Symptoms of obstructive uropathy. Improved   4. Distal duodenal and descending colon masses  biopsy of the duodenal mass 03/04/2016 confirmed fragments of small bowel mucosa with focal high-grade dysplasia  Biopsy of the descending colon "polyps" revealed fragments of a tubulovillous adenoma  CT 08/29/2017-similar soft tissue thickening in the region of the splenic flexure of the colon suboptimally evaluated due to lack of colonic enteric contrast  Referred to Dr. Benson Norway  Referred to [name]  Resection of descending colon polyps 01/31/2018, tubulovillous adenomas with high-grade dysplasia  Attempted endoscopic resection of the fourth segment duodenal "polyp"on 05/09/2018, firm unresectable lesion, biopsy revealed moderately differentiated adenocarcinoma  CT abdomen/pelvis 05/25/2018-heterogeneously enhancing right lower pole renal lesion; diffuse sclerotic osseous metastatic lesions compatible with history of prostate cancer  CT chest 05/25/2018-no pulmonary metastasis. Diffuse osseous metastasis involving the axial and appendicular skeleton.  06/19/2018-sleeve resection of third and fourth portions of duodenum with primary end-to-end anastomosis, feeding gastrojejunostomy tube placement, Dr. Stark Klein differentiated adenocarcinoma duodenum, 3 cm;tumor invades the muscularis propria; lymphovascular invasion present; negative margins; 4 of 11 involved lymph nodes;soft tissue deposits also seen in the mesentery; all mucosal margins and soft tissue margins negative but 2 of the positive nodes are found in the soft tissue margin submitted for frozen section(pT2 pN2); second mucosal lesion 1.2  cm from the proximal margin, tubular adenoma with focal severe dysplasia  Intact  expression of mismatch repair proteins by IHC; microsatellite stable  Cycle 1 adjuvant Xeloda 07/23/2018  Cycle 2 adjuvant Xeloda 08/13/2018  Cycle 3 adjuvant Xeloda 09/10/2018 (dose reduced to 1000 mg twice daily for 14 days)   Cycle 4 adjuvant Xeloda 10/01/2018 (dose increased to 1500 mg every morning and 1000 mg every afternoon for 14 days)  Cycle 5 adjuvant Xeloda 10/22/2018  5. Anorexia/weight loss-resolved  6. Undescended testicles  7. Right renal mass. CT 08/29/2017-right renal mass involving the lower pole was partially calcified and does demonstrate enhancement following contrast most consistent with renal cell carcinoma. Lesion not significantly enlarged compared with the prior CT. Followed by urology.  8. Diabetes   Disposition: Brandon Peters appears stable.  He has completed 4 cycles of adjuvant Xeloda.  He will begin cycle 5 10/22/2018.  He continues to have hand/foot syndrome, but is tolerating the skin changes well.  I did not change the dose with this cycle.  He will return for an office visit and Lupron injection on 11/09/2018.  He last received Zometa 08/30/2018.  He will receive Zometa with the Lupron injection in May.  [name], MD  10/18/2018  12:17 PM

## 2018-10-18 NOTE — Telephone Encounter (Signed)
Printed avs and calender of upcoming appointment. Per 12/3 los 

## 2018-10-19 LAB — PROSTATE-SPECIFIC AG, SERUM (LABCORP)

## 2018-10-23 ENCOUNTER — Telehealth: Payer: Self-pay | Admitting: Pharmacist

## 2018-10-23 NOTE — Telephone Encounter (Signed)
Oral Chemotherapy Pharmacist Encounter  Follow-Up Form  Spoke with patient today to follow up regarding patient's oral chemotherapy medications:   Xeloda (capecitabine)for the adjuvanttreatment of stage III small bowel cancer, planned duration6 months of therapy (8 cycles).  Zytiga (abiraterone) for the treatment of metastatic, castration-sensitive prostate cancer, in conjunction with prednisone and androgen deprivation therapy (Lupron injections), planned duration until disease progression or unacceptable toxicity  Original Start date of Xeloda: 07/23/2018 Original Start date of Zytiga: 05/09/2016  Pt is doing well today  Pt reports 0 tablets/doses of Xeloda 500mg  tablets, 3 tablets (1500mg ) by mouth in AM and 2 tabs (1000mg ) by mouth in PM, within 30 minutes of finishing meals, on days 1-14 of each 21 day cycle, missed in the last month.   Pt reports 0 tablets/doses of Zytiga 250mg  tablets, 4 tablets (1000mg ) by mouth once daily on an empty stomach, 1 hour before or 2 hours after food, missed in the last month.   Pt reports the following side effects:   Manageable hand-foot syndrome with Xeloda  No sise effects to report from [facility]  Pertinent labs reviewed: OK for continued treatment.  Patient knows to call the office with questions or concerns. Oral Oncology Clinic will continue to follow.  Johny Drilling, PharmD, BCPS, BCOP  10/23/2018 12:30 PM Oral Oncology Clinic [phone]

## 2018-10-24 ENCOUNTER — Other Ambulatory Visit: Payer: Self-pay | Admitting: Oncology

## 2018-10-24 DIAGNOSIS — C179 Malignant neoplasm of small intestine, unspecified: Secondary | ICD-10-CM

## 2018-10-25 ENCOUNTER — Telehealth: Payer: Self-pay | Admitting: *Deleted

## 2018-10-25 NOTE — Telephone Encounter (Signed)
Received refill request for Xeloda--this cycle #5 started 1/27. Next cycle due 2/17. Confirmed with patient he only needs a 6 day supply (#30 tabs) to finish this cycle.

## 2018-10-26 MED FILL — XELODA 500 MG TABLET: 500 | 21 days supply | Qty: 30 | Fill #0

## 2018-11-05 ENCOUNTER — Other Ambulatory Visit: Payer: Self-pay | Admitting: Oncology

## 2018-11-05 DIAGNOSIS — C179 Malignant neoplasm of small intestine, unspecified: Secondary | ICD-10-CM

## 2018-11-06 NOTE — Telephone Encounter (Signed)
Will review w/ MD.

## 2018-11-07 NOTE — Progress Notes (Signed)
Ok per Ned Card, NP to administer Zometa on 11/08/18 - a little early for q3 month schedule.  Lisa aware. Kennith Center, Pharm.D., CPP 11/07/2018@12 :52 PM

## 2018-11-08 ENCOUNTER — Inpatient Hospital Stay (HOSPITAL_BASED_OUTPATIENT_CLINIC_OR_DEPARTMENT_OTHER): Payer: Self-pay | Admitting: Nurse Practitioner

## 2018-11-08 ENCOUNTER — Inpatient Hospital Stay: Payer: Self-pay | Attending: Nurse Practitioner

## 2018-11-08 ENCOUNTER — Inpatient Hospital Stay: Payer: Self-pay

## 2018-11-08 ENCOUNTER — Telehealth: Payer: Self-pay | Admitting: Nurse Practitioner

## 2018-11-08 ENCOUNTER — Encounter: Payer: Self-pay | Admitting: Nurse Practitioner

## 2018-11-08 VITALS — BP 133/77 | HR 94 | Temp 97.9°F | Resp 20 | Ht 60.0 in | Wt 152.1 lb

## 2018-11-08 DIAGNOSIS — E119 Type 2 diabetes mellitus without complications: Secondary | ICD-10-CM | POA: Insufficient documentation

## 2018-11-08 DIAGNOSIS — Z79899 Other long term (current) drug therapy: Secondary | ICD-10-CM

## 2018-11-08 DIAGNOSIS — D649 Anemia, unspecified: Secondary | ICD-10-CM | POA: Insufficient documentation

## 2018-11-08 DIAGNOSIS — C179 Malignant neoplasm of small intestine, unspecified: Secondary | ICD-10-CM

## 2018-11-08 DIAGNOSIS — C61 Malignant neoplasm of prostate: Secondary | ICD-10-CM

## 2018-11-08 DIAGNOSIS — C7951 Secondary malignant neoplasm of bone: Secondary | ICD-10-CM | POA: Insufficient documentation

## 2018-11-08 LAB — CBC WITH DIFFERENTIAL (CANCER CENTER ONLY)
Abs Immature Granulocytes: 0.19 10*3/uL — ABNORMAL HIGH (ref 0.00–0.07)
Basophils Absolute: 0 10*3/uL (ref 0.0–0.1)
Basophils Relative: 0 %
Eosinophils Absolute: 0 10*3/uL (ref 0.0–0.5)
Eosinophils Relative: 1 %
HCT: 28.5 % — ABNORMAL LOW (ref 39.0–52.0)
Hemoglobin: 9.3 g/dL — ABNORMAL LOW (ref 13.0–17.0)
Immature Granulocytes: 2 %
LYMPHS ABS: 1.2 10*3/uL (ref 0.7–4.0)
Lymphocytes Relative: 14 %
MCH: 33.3 pg (ref 26.0–34.0)
MCHC: 32.6 g/dL (ref 30.0–36.0)
MCV: 102.2 fL — ABNORMAL HIGH (ref 80.0–100.0)
MONO ABS: 0.7 10*3/uL (ref 0.1–1.0)
MONOS PCT: 8 %
Neutro Abs: 6.6 10*3/uL (ref 1.7–7.7)
Neutrophils Relative %: 75 %
Platelet Count: 243 10*3/uL (ref 150–400)
RBC: 2.79 MIL/uL — ABNORMAL LOW (ref 4.22–5.81)
RDW: 17 % — ABNORMAL HIGH (ref 11.5–15.5)
WBC Count: 8.7 10*3/uL (ref 4.0–10.5)
nRBC: 0 % (ref 0.0–0.2)

## 2018-11-08 LAB — CMP (CANCER CENTER ONLY)
ALT: 37 U/L (ref 0–44)
AST: 19 U/L (ref 15–41)
Albumin: 3.6 g/dL (ref 3.5–5.0)
Alkaline Phosphatase: 91 U/L (ref 38–126)
Anion gap: 13 (ref 5–15)
BUN: 24 mg/dL — AB (ref 8–23)
CO2: 23 mmol/L (ref 22–32)
Calcium: 9.7 mg/dL (ref 8.9–10.3)
Chloride: 98 mmol/L (ref 98–111)
Creatinine: 0.95 mg/dL (ref 0.61–1.24)
GFR, Est AFR Am: >60 mL/min (ref >60)
GFR, Estimated: >60 mL/min (ref >60)
Glucose, Bld: 228 mg/dL — ABNORMAL HIGH (ref 70–99)
Potassium: 3.4 mmol/L — ABNORMAL LOW (ref 3.5–5.1)
Sodium: 134 mmol/L — ABNORMAL LOW (ref 135–145)
Total Bilirubin: 0.5 mg/dL (ref 0.3–1.2)
Total Protein: 6.9 g/dL (ref 6.5–8.1)

## 2018-11-08 MED ORDER — LEUPROLIDE ACETATE (3 MONTH) 22.5 MG IM KIT
PACK | INTRAMUSCULAR | Status: AC
  Filled 2018-11-08: qty 22.5

## 2018-11-08 MED ORDER — SODIUM CHLORIDE 0.9 % IV SOLN
INTRAVENOUS | Status: DC
  Administered 2018-11-08: 12:00:00 via INTRAVENOUS
  Filled 2018-11-08: qty 250

## 2018-11-08 MED ORDER — LEUPROLIDE ACETATE (3 MONTH) 22.5 MG IM KIT
22.5000 mg | PACK | Freq: Once | INTRAMUSCULAR | Status: AC
  Administered 2018-11-08: 22.5 mg via INTRAMUSCULAR

## 2018-11-08 MED ORDER — ZOLEDRONIC ACID 4 MG/100ML IV SOLN
4.0000 mg | Freq: Once | INTRAVENOUS | Status: AC
  Administered 2018-11-08: 4 mg via INTRAVENOUS
  Filled 2018-11-08: qty 100

## 2018-11-08 MED FILL — XELODA 500 MG TABLET: 500 | 21 days supply | Qty: 70 | Fill #0

## 2018-11-08 NOTE — Patient Instructions (Signed)
Zoledronic Acid injection (Hypercalcemia, Oncology) What is this medicine? ZOLEDRONIC ACID (ZOE le dron ik AS id) lowers the amount of calcium loss from bone. It is used to treat too much calcium in your blood from cancer. It is also used to prevent complications of cancer that has spread to the bone. This medicine may be used for other purposes; ask your health care provider or pharmacist if you have questions. COMMON BRAND NAME(S): Zometa What should I tell my health care provider before I take this medicine? They need to know if you have any of these conditions: -aspirin-sensitive asthma -cancer, especially if you are receiving medicines used to treat cancer -dental disease or wear dentures -infection -kidney disease -receiving corticosteroids like dexamethasone or prednisone -an unusual or allergic reaction to zoledronic acid, other medicines, foods, dyes, or preservatives -pregnant or trying to get pregnant -breast-feeding How should I use this medicine? This medicine is for infusion into a vein. It is given by a health care professional in a hospital or clinic setting. Talk to your pediatrician regarding the use of this medicine in children. Special care may be needed. Overdosage: If you think you have taken too much of this medicine contact a poison control center or emergency room at once. NOTE: This medicine is only for you. Do not share this medicine with others. What if I miss a dose? It is important not to miss your dose. Call your doctor or health care professional if you are unable to keep an appointment. What may interact with this medicine? -certain antibiotics given by injection -NSAIDs, medicines for pain and inflammation, like ibuprofen or naproxen -some diuretics like bumetanide, furosemide -teriparatide -thalidomide This list may not describe all possible interactions. Give your health care provider a list of all the medicines, herbs, non-prescription drugs, or  dietary supplements you use. Also tell them if you smoke, drink alcohol, or use illegal drugs. Some items may interact with your medicine. What should I watch for while using this medicine? Visit your doctor or health care professional for regular checkups. It may be some time before you see the benefit from this medicine. Do not stop taking your medicine unless your doctor tells you to. Your doctor may order blood tests or other tests to see how you are doing. Women should inform their doctor if they wish to become pregnant or think they might be pregnant. There is a potential for serious side effects to an unborn child. Talk to your health care professional or pharmacist for more information. You should make sure that you get enough calcium and vitamin D while you are taking this medicine. Discuss the foods you eat and the vitamins you take with your health care professional. Some people who take this medicine have severe bone, joint, and/or muscle pain. This medicine may also increase your risk for jaw problems or a broken thigh bone. Tell your doctor right away if you have severe pain in your jaw, bones, joints, or muscles. Tell your doctor if you have any pain that does not go away or that gets worse. Tell your dentist and dental surgeon that you are taking this medicine. You should not have major dental surgery while on this medicine. See your dentist to have a dental exam and fix any dental problems before starting this medicine. Take good care of your teeth while on this medicine. Make sure you see your dentist for regular follow-up appointments. What side effects may I notice from receiving this medicine? Side effects that   this medicine. Take good care of your teeth while on this medicine. Make sure you see your dentist for regular follow-up appointments.  What side effects may I notice from receiving this medicine?  Side effects that you should report to your doctor or health care professional as soon as possible:  -allergic reactions like skin rash, itching or hives, swelling of the face, lips, or tongue  -anxiety, confusion, or depression  -breathing problems  -changes in vision  -eye pain  -feeling faint or  lightheaded, falls  -jaw pain, especially after dental work  -mouth sores  -muscle cramps, stiffness, or weakness  -redness, blistering, peeling or loosening of the skin, including inside the mouth  -trouble passing urine or change in the amount of urine  Side effects that usually do not require medical attention (report to your doctor or health care professional if they continue or are bothersome):  -bone, joint, or muscle pain  -constipation  -diarrhea  -fever  -hair loss  -irritation at site where injected  -loss of appetite  -nausea, vomiting  -stomach upset  -trouble sleeping  -trouble swallowing  -weak or tired  This list may not describe all possible side effects. Call your doctor for medical advice about side effects. You may report side effects to FDA at 1-800-FDA-1088.  Where should I keep my medicine?  This drug is given in a hospital or clinic and will not be stored at home.  NOTE: This sheet is a summary. It may not cover all possible information. If you have questions about this medicine, talk to your doctor, pharmacist, or health care provider.   2019 Elsevier/Gold Standard (2014-02-08 14:19:39)  Leuprolide injection  What is this medicine?  LEUPROLIDE (loo PROE lide) is a man-made hormone. It is used to treat the symptoms of prostate cancer. This medicine may also be used to treat children with early onset of puberty. It may be used for other hormonal conditions.  This medicine may be used for other purposes; ask your health care provider or pharmacist if you have questions.  COMMON BRAND NAME(S): Lupron  What should I tell my health care provider before I take this medicine?  They need to know if you have any of these conditions:  -diabetes  -heart disease or previous heart attack  -high blood pressure  -high cholesterol  -pain or difficulty passing urine  -spinal cord metastasis  -stroke  -tobacco smoker  -an unusual or allergic reaction to leuprolide, benzyl alcohol, other medicines, foods, dyes,  or preservatives  -pregnant or trying to get pregnant  -breast-feeding  How should I use this medicine?  This medicine is for injection under the skin or into a muscle. You will be taught how to prepare and give this medicine. Use exactly as directed. Take your medicine at regular intervals. Do not take your medicine more often than directed.  It is important that you put your used needles and syringes in a special sharps container. Do not put them in a trash can. If you do not have a sharps container, call your pharmacist or healthcare provider to get one.  A special MedGuide will be given to you by the pharmacist with each prescription and refill. Be sure to read this information carefully each time.  Talk to your pediatrician regarding the use of this medicine in children. While this medicine may be prescribed for children as young as 8 years for selected conditions, precautions do apply.  Overdosage: If you think you have taken too much   of this medicine contact a poison control center or emergency room at once.  NOTE: This medicine is only for you. Do not share this medicine with others.  What if I miss a dose?  If you miss a dose, take it as soon as you can. If it is almost time for your next dose, take only that dose. Do not take double or extra doses.  What may interact with this medicine?  Do not take this medicine with any of the following medications:  -chasteberry  This medicine may also interact with the following medications:  -herbal or dietary supplements, like black cohosh or DHEA  -male hormones, like estrogens or progestins and birth control pills, patches, rings, or injections  -male hormones, like testosterone  This list may not describe all possible interactions. Give your health care provider a list of all the medicines, herbs, non-prescription drugs, or dietary supplements you use. Also tell them if you smoke, drink alcohol, or use illegal drugs. Some items may interact with your  medicine.  What should I watch for while using this medicine?  Visit your doctor or health care professional for regular checks on your progress. During the first week, your symptoms may get worse, but then will improve as you continue your treatment. You may get hot flashes, increased bone pain, increased difficulty passing urine, or an aggravation of nerve symptoms. Discuss these effects with your doctor or health care professional, some of them may improve with continued use of this medicine.  Male patients may experience a menstrual cycle or spotting during the first 2 months of therapy with this medicine. If this continues, contact your doctor or health care professional.  What side effects may I notice from receiving this medicine?  Side effects that you should report to your doctor or health care professional as soon as possible:  -allergic reactions like skin rash, itching or hives, swelling of the face, lips, or tongue  -breathing problems  -chest pain  -depression or memory disorders  -pain in your legs or groin  -pain at site where injected  -severe headache  -swelling of the feet and legs  -visual changes  -vomiting  Side effects that usually do not require medical attention (report to your doctor or health care professional if they continue or are bothersome):  -breast swelling or tenderness  -decrease in sex drive or performance  -diarrhea  -hot flashes  -loss of appetite  -muscle, joint, or bone pains  -nausea  -redness or irritation at site where injected  -skin problems or acne  This list may not describe all possible side effects. Call your doctor for medical advice about side effects. You may report side effects to FDA at 1-800-FDA-1088.  Where should I keep my medicine?  Keep out of the reach of children.  Store below 25 degrees C (77 degrees F). Do not freeze. Protect from light. Do not use if it is not clear or if there are particles present. Throw away any unused medicine after the  expiration date.  NOTE: This sheet is a summary. It may not cover all possible information. If you have questions about this medicine, talk to your doctor, pharmacist, or health care provider.   2019 Elsevier/Gold Standard (2016-05-02 10:54:35)

## 2018-11-08 NOTE — Telephone Encounter (Signed)
Scheduled appt per 2/13 los.  Printed calendar. Patient declined avs.

## 2018-11-08 NOTE — Progress Notes (Signed)
[place] OFFICE PROGRESS NOTE   Diagnosis: Prostate cancer, small bowel carcinoma  INTERVAL HISTORY:   Brandon Peters returns as scheduled.  He began cycle 5 Xeloda beginning 10/22/2018.  He denies nausea/vomiting.  No mouth sores.  He had loose stools for 1 day.  No hand or foot pain or redness.  He continues abiraterone and prednisone.  Objective:  Vital signs in last 24 hours:  Blood pressure 133/77, pulse 94, temperature 97.9 F (36.6 C), temperature source Oral, resp. rate 20, height 5' (1.524 m), weight 152 lb 1.6 oz (69 kg), SpO2 98 %.    HEENT: No thrush or ulcers. Resp: Lungs clear bilaterally. Cardio: Regular rate and rhythm. GI: Abdomen soft and nontender.  No hepatomegaly. Vascular: Trace bilateral lower leg edema.  Skin: Palms with dryness, skin thickening and hyperpigmentation.  Changes more significant on the right hand than the left hand.   Lab Results:  Lab Results  Component Value Date   WBC 8.7 11/08/2018   HGB 9.3 (L) 11/08/2018   HCT 28.5 (L) 11/08/2018   MCV 102.2 (H) 11/08/2018   PLT 243 11/08/2018   NEUTROABS 6.6 11/08/2018    Imaging:  No results found.  Medications: I have reviewed the patient's current medications.  Assessment/Plan: 1. History of Severe anemia-status post a red cell transfusion 03/04/2016 2.. Prostate cancer  Extensive sclerotic bone metastases noted on a CT of the abdomen/pelvis 03/04/2016 and MRI abdomen 03/05/2016  Markedly elevated PSA  Lupron initiated 03/15/2016; Casodex 14 days beginning 03/15/2016  Prostate biopsy 03/18/2016  05/09/2016 PSA significantly improved at 21  Abirateroneinitiated08/15/2017  PSA less than 0.1 07/10/2018  PSA less than 0.1 08/30/2018  PSA less than 0.1 10/18/2018  3. Symptoms of obstructive uropathy. Improved   4. Distal duodenal and descending colon masses  biopsy of the duodenal mass 03/04/2016 confirmed fragments of small bowel mucosa with focal  high-grade dysplasia  Biopsy of the descending colon "polyps" revealed fragments of a tubulovillous adenoma  CT 08/29/2017-similar soft tissue thickening in the region of the splenic flexure of the colon suboptimally evaluated due to lack of colonic enteric contrast  Referred to Dr. Benson Norway  Referred to [facility]  Resection of descending colon polyps 01/31/2018, tubulovillous adenomas with high-grade dysplasia  Attempted endoscopic resection of the fourth segment duodenal "polyp"on 05/09/2018, firm unresectable lesion, biopsy revealed moderately differentiated adenocarcinoma  CT abdomen/pelvis 05/25/2018-heterogeneously enhancing right lower pole renal lesion; diffuse sclerotic osseous metastatic lesions compatible with history of prostate cancer  CT chest 05/25/2018-no pulmonary metastasis. Diffuse osseous metastasis involving the axial and appendicular skeleton.  06/19/2018-sleeve resection of third and fourth portions of duodenum with primary end-to-end anastomosis, feeding gastrojejunostomy tube placement, Dr. Stark Klein differentiated adenocarcinoma duodenum, 3 cm;tumor invades the muscularis propria; lymphovascular invasion present; negative margins; 4 of 11 involved lymph nodes;soft tissue deposits also seen in the mesentery; all mucosal margins and soft tissue margins negative but 2 of the positive nodes are found in the soft tissue margin submitted for frozen section(pT2 pN2); second mucosal lesion 1.2 cm from the proximal margin, tubular adenoma with focal severe dysplasia  Intact expression of mismatch repair proteins by IHC; microsatellite stable  Cycle 1 adjuvant Xeloda 07/23/2018  Cycle 2 adjuvant Xeloda 08/13/2018  Cycle 3 adjuvant Xeloda 09/10/2018 (dose reduced to 1000 mg twice daily for 14 days)   Cycle 4 adjuvant Xeloda 10/01/2018 (dose increased to 1500 mg every morning and 1000 mg every afternoon for 14 days)  Cycle 5 adjuvant Xeloda 10/22/2018  Cycle 6 adjuvant  Xeloda  11/12/2018  5. Anorexia/weight loss-resolved  6. Undescended testicles  7. Right renal mass. CT 08/29/2017-right renal mass involving the lower pole was partially calcified and does demonstrate enhancement following contrast most consistent with renal cell carcinoma. Lesion not significantly enlarged compared with the prior CT. Followed by urology.  8. Diabetes  Disposition: Brandon Peters appears stable.  He has completed 5 cycles of adjuvant Xeloda.  He has changes of hand-foot syndrome which have not progressed.  He will begin cycle 6 Xeloda on 11/12/2018.  He continues abiraterone and prednisone.  He is scheduled for the every 28-monthLupron and Zometa today.  He will return for lab and follow-up in 3 weeks.  He will contact the office in the interim with any problems.    LNed CardANP/GNP-BC   11/08/2018  10:44 AM

## 2018-11-09 LAB — PROSTATE-SPECIFIC AG, SERUM (LABCORP): Prostate Specific Ag, Serum: <0.1 ng/mL (ref 0.0–4.0)

## 2018-11-22 ENCOUNTER — Other Ambulatory Visit: Payer: Self-pay | Admitting: Oncology

## 2018-11-22 DIAGNOSIS — C179 Malignant neoplasm of small intestine, unspecified: Secondary | ICD-10-CM

## 2018-11-22 NOTE — Telephone Encounter (Signed)
Will review w/MD--cycle due to begin on 12/03/18

## 2018-11-28 MED FILL — predniSONE 5 MG TABS: 5 | 90 days supply | Qty: 180 | Fill #1

## 2018-11-29 ENCOUNTER — Other Ambulatory Visit: Payer: Self-pay

## 2018-11-29 ENCOUNTER — Inpatient Hospital Stay: Payer: Self-pay | Attending: Nurse Practitioner

## 2018-11-29 ENCOUNTER — Telehealth: Payer: Self-pay | Admitting: Oncology

## 2018-11-29 ENCOUNTER — Inpatient Hospital Stay (HOSPITAL_BASED_OUTPATIENT_CLINIC_OR_DEPARTMENT_OTHER): Payer: Self-pay | Admitting: Oncology

## 2018-11-29 VITALS — BP 137/84 | HR 87 | Temp 97.7°F | Resp 18 | Ht 60.0 in | Wt 151.2 lb

## 2018-11-29 DIAGNOSIS — C61 Malignant neoplasm of prostate: Secondary | ICD-10-CM

## 2018-11-29 DIAGNOSIS — C7951 Secondary malignant neoplasm of bone: Secondary | ICD-10-CM | POA: Insufficient documentation

## 2018-11-29 DIAGNOSIS — C179 Malignant neoplasm of small intestine, unspecified: Secondary | ICD-10-CM

## 2018-11-29 DIAGNOSIS — E119 Type 2 diabetes mellitus without complications: Secondary | ICD-10-CM

## 2018-11-29 DIAGNOSIS — N289 Disorder of kidney and ureter, unspecified: Secondary | ICD-10-CM | POA: Insufficient documentation

## 2018-11-29 DIAGNOSIS — L271 Localized skin eruption due to drugs and medicaments taken internally: Secondary | ICD-10-CM | POA: Insufficient documentation

## 2018-11-29 DIAGNOSIS — Q539 Undescended testicle, unspecified: Secondary | ICD-10-CM

## 2018-11-29 DIAGNOSIS — Z79899 Other long term (current) drug therapy: Secondary | ICD-10-CM | POA: Insufficient documentation

## 2018-11-29 LAB — COMPREHENSIVE METABOLIC PANEL
ALT: 42 U/L (ref 0–44)
AST: 23 U/L (ref 15–41)
Albumin: 3.8 g/dL (ref 3.5–5.0)
Alkaline Phosphatase: 82 U/L (ref 38–126)
Anion gap: 11 (ref 5–15)
BUN: 31 mg/dL — ABNORMAL HIGH (ref 8–23)
CO2: 23 mmol/L (ref 22–32)
Calcium: 9.6 mg/dL (ref 8.9–10.3)
Chloride: 99 mmol/L (ref 98–111)
Creatinine, Ser: 0.96 mg/dL (ref 0.61–1.24)
GFR calc Af Amer: >60 mL/min (ref >60)
GFR calc non Af Amer: >60 mL/min (ref >60)
Glucose, Bld: 192 mg/dL — ABNORMAL HIGH (ref 70–99)
Potassium: 3.5 mmol/L (ref 3.5–5.1)
Sodium: 133 mmol/L — ABNORMAL LOW (ref 135–145)
Total Bilirubin: 0.5 mg/dL (ref 0.3–1.2)
Total Protein: 7 g/dL (ref 6.5–8.1)

## 2018-11-29 LAB — CBC WITH DIFFERENTIAL (CANCER CENTER ONLY)
Abs Immature Granulocytes: 0.14 10*3/uL — ABNORMAL HIGH (ref 0.00–0.07)
BASOS ABS: 0 10*3/uL (ref 0.0–0.1)
Basophils Relative: 1 %
Eosinophils Absolute: 0.1 10*3/uL (ref 0.0–0.5)
Eosinophils Relative: 1 %
HCT: 29.4 % — ABNORMAL LOW (ref 39.0–52.0)
Hemoglobin: 9.2 g/dL — ABNORMAL LOW (ref 13.0–17.0)
IMMATURE GRANULOCYTES: 2 %
Lymphocytes Relative: 19 %
Lymphs Abs: 1.6 10*3/uL (ref 0.7–4.0)
MCH: 32.7 pg (ref 26.0–34.0)
MCHC: 31.3 g/dL (ref 30.0–36.0)
MCV: 104.6 fL — ABNORMAL HIGH (ref 80.0–100.0)
Monocytes Absolute: 0.8 10*3/uL (ref 0.1–1.0)
Monocytes Relative: 9 %
NRBC: 0 % (ref 0.0–0.2)
Neutro Abs: 5.8 10*3/uL (ref 1.7–7.7)
Neutrophils Relative %: 68 %
Platelet Count: 253 10*3/uL (ref 150–400)
RBC: 2.81 MIL/uL — ABNORMAL LOW (ref 4.22–5.81)
RDW: 16.6 % — ABNORMAL HIGH (ref 11.5–15.5)
WBC Count: 8.5 10*3/uL (ref 4.0–10.5)

## 2018-11-29 MED FILL — XELODA 500 MG TABLET: 500 | 21 days supply | Qty: 70 | Fill #0

## 2018-11-29 NOTE — Telephone Encounter (Signed)
Scheduled appt per 3/5 los. ° °Printed calendar and avs. °

## 2018-11-29 NOTE — Progress Notes (Signed)
[place] OFFICE PROGRESS NOTE   Diagnosis: Small bowel carcinoma, prostate cancer  INTERVAL HISTORY:   Brandon Peters returns as scheduled.  He completed another cycle of Xeloda beginning 11/12/2018.  He has mild intermittent diarrhea.  Stable skin changes at the hands and feet.  No new complaint.  Objective:  Vital signs in last 24 hours:  Blood pressure 137/84, pulse 87, temperature 97.7 F (36.5 C), temperature source Oral, resp. rate 18, height 5' (1.524 m), weight 151 lb 3.2 oz (68.6 kg), SpO2 100 %.    HEENT: No thrush or ulcers Resp: Decreased breath sounds with end inspiratory rales at the right posterior base, no respiratory distress Cardio: Regular rate and rhythm GI: No hepatomegaly, nontender Vascular: Pitting edema at the left greater than right lower leg  Skin: Skin thickening, superficial dry desquamation, and hyperpigmentation at the hands and feet bilaterally   Lab Results:  Lab Results  Component Value Date   WBC 8.5 11/29/2018   HGB 9.2 (L) 11/29/2018   HCT 29.4 (L) 11/29/2018   MCV 104.6 (H) 11/29/2018   PLT 253 11/29/2018   NEUTROABS 5.8 11/29/2018    CMP  Lab Results  Component Value Date   NA 133 (L) 11/29/2018   K 3.5 11/29/2018   CL 99 11/29/2018   CO2 23 11/29/2018   GLUCOSE 192 (H) 11/29/2018   BUN 31 (H) 11/29/2018   CREATININE 0.96 11/29/2018   CALCIUM 9.6 11/29/2018   PROT 7.0 11/29/2018   ALBUMIN 3.8 11/29/2018   AST 23 11/29/2018   ALT 42 11/29/2018   ALKPHOS 82 11/29/2018   BILITOT 0.5 11/29/2018   GFRNONAA >60 11/29/2018   GFRAA >60 11/29/2018     Medications: I have reviewed the patient's current medications.   Assessment/Plan: 1. History of Severe anemia-status post a red cell transfusion 03/04/2016 2.. Prostate cancer  Extensive sclerotic bone metastases noted on a CT of the abdomen/pelvis 03/04/2016 and MRI abdomen 03/05/2016  Markedly elevated PSA  Lupron initiated 03/15/2016; Casodex 14 days  beginning 03/15/2016  Prostate biopsy 03/18/2016  05/09/2016 PSA significantly improved at 21  Abirateroneinitiated08/15/2017  PSA less than 0.1 07/10/2018  PSA less than 0.1 08/30/2018  PSA less than 0.1 10/18/2018  3. Symptoms of obstructive uropathy. Improved   4. Distal duodenal and descending colon masses  biopsy of the duodenal mass 03/04/2016 confirmed fragments of small bowel mucosa with focal high-grade dysplasia  Biopsy of the descending colon "polyps" revealed fragments of a tubulovillous adenoma  CT 08/29/2017-similar soft tissue thickening in the region of the splenic flexure of the colon suboptimally evaluated due to lack of colonic enteric contrast  Referred to Dr. Benson Norway  Referred to [facility]  Resection of descending colon polyps 01/31/2018, tubulovillous adenomas with high-grade dysplasia  Attempted endoscopic resection of the fourth segment duodenal "polyp"on 05/09/2018, firm unresectable lesion, biopsy revealed moderately differentiated adenocarcinoma  CT abdomen/pelvis 05/25/2018-heterogeneously enhancing right lower pole renal lesion; diffuse sclerotic osseous metastatic lesions compatible with history of prostate cancer  CT chest 05/25/2018-no pulmonary metastasis. Diffuse osseous metastasis involving the axial and appendicular skeleton.  06/19/2018-sleeve resection of third and fourth portions of duodenum with primary end-to-end anastomosis, feeding gastrojejunostomy tube placement, Dr. Stark Klein differentiated adenocarcinoma duodenum, 3 cm;tumor invades the muscularis propria; lymphovascular invasion present; negative margins; 4 of 11 involved lymph nodes;soft tissue deposits also seen in the mesentery; all mucosal margins and soft tissue margins negative but 2 of the positive nodes are found in the soft tissue margin submitted for frozen section(pT2  pN2); second mucosal lesion 1.2 cm from the proximal margin, tubular adenoma with focal severe  dysplasia  Intact expression of mismatch repair proteins by IHC; microsatellite stable  Cycle 1 adjuvant Xeloda 07/23/2018  Cycle 2 adjuvant Xeloda 08/13/2018  Cycle 3 adjuvant Xeloda 09/10/2018 (dose reduced to 1000 mg twice daily for 14 days)   Cycle 4 adjuvant Xeloda 10/01/2018 (dose increased to 1500 mg every morning and 1000 mg every afternoon for 14 days)  Cycle 5 adjuvant Xeloda 10/22/2018  Cycle 6 adjuvant Xeloda 11/12/2018  Cycle 7 adjuvant Xeloda 12/03/2018  5. Anorexia/weight loss-resolved  6. Undescended testicles  7. Right renal mass. CT 08/29/2017-right renal mass involving the lower pole was partially calcified and does demonstrate enhancement following contrast most consistent with renal cell carcinoma. Lesion not significantly enlarged compared with the prior CT. Followed by urology.  8. Diabetes   Disposition: [name] appears unchanged.  He will complete another cycle of adjuvant Xeloda beginning 12/03/2018.  He continues abiraterone and prednisone.  He will be due for Lupron and in May.  [name], MD  11/29/2018  10:31 AM

## 2018-11-30 LAB — PROSTATE-SPECIFIC AG, SERUM (LABCORP): Prostate Specific Ag, Serum: <0.1 ng/mL (ref 0.0–4.0)

## 2018-12-11 ENCOUNTER — Other Ambulatory Visit: Payer: Self-pay | Admitting: Oncology

## 2018-12-11 DIAGNOSIS — C179 Malignant neoplasm of small intestine, unspecified: Secondary | ICD-10-CM

## 2018-12-17 ENCOUNTER — Other Ambulatory Visit: Payer: Self-pay | Admitting: Oncology

## 2018-12-19 MED FILL — XELODA 500 MG TABLET: 500 | 21 days supply | Qty: 70 | Fill #0

## 2018-12-20 ENCOUNTER — Inpatient Hospital Stay (HOSPITAL_BASED_OUTPATIENT_CLINIC_OR_DEPARTMENT_OTHER): Payer: Self-pay | Admitting: Nurse Practitioner

## 2018-12-20 ENCOUNTER — Other Ambulatory Visit: Payer: Self-pay

## 2018-12-20 ENCOUNTER — Inpatient Hospital Stay: Payer: Self-pay

## 2018-12-20 ENCOUNTER — Encounter: Payer: Self-pay | Admitting: Nurse Practitioner

## 2018-12-20 VITALS — BP 127/81 | HR 101 | Temp 98.4°F | Resp 19 | Ht 60.0 in | Wt 151.2 lb

## 2018-12-20 DIAGNOSIS — C7951 Secondary malignant neoplasm of bone: Secondary | ICD-10-CM

## 2018-12-20 DIAGNOSIS — E119 Type 2 diabetes mellitus without complications: Secondary | ICD-10-CM

## 2018-12-20 DIAGNOSIS — C61 Malignant neoplasm of prostate: Secondary | ICD-10-CM

## 2018-12-20 DIAGNOSIS — N289 Disorder of kidney and ureter, unspecified: Secondary | ICD-10-CM

## 2018-12-20 DIAGNOSIS — C179 Malignant neoplasm of small intestine, unspecified: Secondary | ICD-10-CM

## 2018-12-20 DIAGNOSIS — Q539 Undescended testicle, unspecified: Secondary | ICD-10-CM

## 2018-12-20 DIAGNOSIS — Z79899 Other long term (current) drug therapy: Secondary | ICD-10-CM

## 2018-12-20 DIAGNOSIS — L271 Localized skin eruption due to drugs and medicaments taken internally: Secondary | ICD-10-CM

## 2018-12-20 LAB — CMP (CANCER CENTER ONLY)
ALT: 43 U/L (ref 0–44)
AST: 21 U/L (ref 15–41)
Albumin: 3.7 g/dL (ref 3.5–5.0)
Alkaline Phosphatase: 86 U/L (ref 38–126)
Anion gap: 13 (ref 5–15)
BUN: 25 mg/dL — ABNORMAL HIGH (ref 8–23)
CO2: 24 mmol/L (ref 22–32)
Calcium: 9.7 mg/dL (ref 8.9–10.3)
Chloride: 98 mmol/L (ref 98–111)
Creatinine: 0.9 mg/dL (ref 0.61–1.24)
GFR, Est AFR Am: >60 mL/min (ref >60)
Glucose, Bld: 174 mg/dL — ABNORMAL HIGH (ref 70–99)
Potassium: 3.5 mmol/L (ref 3.5–5.1)
Sodium: 135 mmol/L (ref 135–145)
Total Bilirubin: 0.6 mg/dL (ref 0.3–1.2)
Total Protein: 7 g/dL (ref 6.5–8.1)

## 2018-12-20 LAB — CBC WITH DIFFERENTIAL (CANCER CENTER ONLY)
Abs Immature Granulocytes: 0.22 10*3/uL — ABNORMAL HIGH (ref 0.00–0.07)
BASOS ABS: 0 10*3/uL (ref 0.0–0.1)
Basophils Relative: 0 %
EOS ABS: 0.1 10*3/uL (ref 0.0–0.5)
Eosinophils Relative: 1 %
HCT: 31 % — ABNORMAL LOW (ref 39.0–52.0)
Hemoglobin: 9.6 g/dL — ABNORMAL LOW (ref 13.0–17.0)
Immature Granulocytes: 2 %
Lymphocytes Relative: 23 %
Lymphs Abs: 2.1 10*3/uL (ref 0.7–4.0)
MCH: 33.1 pg (ref 26.0–34.0)
MCHC: 31 g/dL (ref 30.0–36.0)
MCV: 106.9 fL — ABNORMAL HIGH (ref 80.0–100.0)
Monocytes Absolute: 0.9 10*3/uL (ref 0.1–1.0)
Monocytes Relative: 10 %
NRBC: 0.2 % (ref 0.0–0.2)
Neutro Abs: 5.9 10*3/uL (ref 1.7–7.7)
Neutrophils Relative %: 64 %
PLATELETS: 258 10*3/uL (ref 150–400)
RBC: 2.9 MIL/uL — ABNORMAL LOW (ref 4.22–5.81)
RDW: 17.2 % — AB (ref 11.5–15.5)
WBC: 9.2 10*3/uL (ref 4.0–10.5)

## 2018-12-20 NOTE — Progress Notes (Signed)
[facility] OFFICE PROGRESS NOTE   Diagnosis: Small bowel carcinoma, prostate cancer  INTERVAL HISTORY:   Brandon Peters returns as scheduled.  He completed cycle 7 adjuvant Xeloda beginning 12/03/2018.  He denies nausea/vomiting.  No mouth sores.  No diarrhea.  Hands and feet continue to be dry.  He is no longer having "cracking" of the skin and there is no pain on the palms or soles.  No fever, cough or shortness of breath.  Objective:  Vital signs in last 24 hours:  Blood pressure 127/81, pulse (!) 101, temperature 98.4 F (36.9 C), temperature source Oral, resp. rate 19, height 5' (1.524 m), weight 151 lb 3.2 oz (68.6 kg), SpO2 98 %.    HEENT: No thrush or ulcers. Resp: Respirations even and unlabored. GI: Well-healed abdominal scar. Vascular: No leg edema.  Skin: Palms with hyperpigmentation, dry appearance.   Lab Results:  Lab Results  Component Value Date   WBC 9.2 12/20/2018   HGB 9.6 (L) 12/20/2018   HCT 31.0 (L) 12/20/2018   MCV 106.9 (H) 12/20/2018   PLT 258 12/20/2018   NEUTROABS 5.9 12/20/2018    Imaging:  No results found.   Medications: I have reviewed the patient's current medications.  Assessment/Plan: 1. History of Severe anemia-status post a red cell transfusion 03/04/2016 2.. Prostate cancer  Extensive sclerotic bone metastases noted on a CT of the abdomen/pelvis 03/04/2016 and MRI abdomen 03/05/2016  Markedly elevated PSA  Lupron initiated 03/15/2016; Casodex 14 days beginning 03/15/2016  Prostate biopsy 03/18/2016  05/09/2016 PSA significantly improved at 21  Abirateroneinitiated08/15/2017  PSA less than 0.1 07/10/2018  PSA less than 0.1 08/30/2018  PSA less than 0.1 10/18/2018  PSA less than 0.1 11/29/2018  3. Symptoms of obstructive uropathy. Improved   4. Distal duodenal and descending colon masses  biopsy of the duodenal mass 03/04/2016 confirmed fragments of small bowel mucosa with focal high-grade  dysplasia  Biopsy of the descending colon "polyps" revealed fragments of a tubulovillous adenoma  CT 08/29/2017-similar soft tissue thickening in the region of the splenic flexure of the colon suboptimally evaluated due to lack of colonic enteric contrast  Referred to Dr. Benson Norway  Referred to [facility]  Resection of descending colon polyps 01/31/2018, tubulovillous adenomas with high-grade dysplasia  Attempted endoscopic resection of the fourth segment duodenal "polyp"on 05/09/2018, firm unresectable lesion, biopsy revealed moderately differentiated adenocarcinoma  CT abdomen/pelvis 05/25/2018-heterogeneously enhancing right lower pole renal lesion; diffuse sclerotic osseous metastatic lesions compatible with history of prostate cancer  CT chest 05/25/2018-no pulmonary metastasis. Diffuse osseous metastasis involving the axial and appendicular skeleton.  06/19/2018-sleeve resection of third and fourth portions of duodenum with primary end-to-end anastomosis, feeding gastrojejunostomy tube placement, Dr. Stark Klein differentiated adenocarcinoma duodenum, 3 cm;tumor invades the muscularis propria; lymphovascular invasion present; negative margins; 4 of 11 involved lymph nodes;soft tissue deposits also seen in the mesentery; all mucosal margins and soft tissue margins negative but 2 of the positive nodes are found in the soft tissue margin submitted for frozen section(pT2 pN2); second mucosal lesion 1.2 cm from the proximal margin, tubular adenoma with focal severe dysplasia  Intact expression of mismatch repair proteins by IHC; microsatellite stable  Cycle 1 adjuvant Xeloda 07/23/2018  Cycle 2 adjuvant Xeloda 08/13/2018  Cycle 3 adjuvant Xeloda 09/10/2018 (dose reduced to 1000 mg twice daily for 14 days)  Cycle 4 adjuvant Xeloda 10/01/2018 (dose increased to 1500 mg every morning and 1000 mg every afternoon for 14 days)  Cycle 5 adjuvant Xeloda 10/22/2018  Cycle 6  adjuvant Xeloda 11/12/2018   Cycle 7 adjuvant Xeloda 12/03/2018  Cycle 8 adjuvant Xeloda 12/24/2018  5. Anorexia/weight loss-resolved  6. Undescended testicles  7. Right renal mass. CT 08/29/2017-right renal mass involving the lower pole was partially calcified and does demonstrate enhancement following contrast most consistent with renal cell carcinoma. Lesion not significantly enlarged compared with the prior CT. Followed by urology.  8. Diabetes   Disposition: Brandon Peters appears stable.  He has completed 7 cycles of adjuvant Xeloda.  He continues to tolerate well.  He has stable changes of hand-foot syndrome.  Plan to proceed with the eighth and final cycle as scheduled beginning 12/24/2018.  We reviewed the CBC from today.  Counts adequate to proceed as above.  He continues abiraterone.  There is no clinical evidence of disease progression.  Most recent PSA was less than 0.1.  He will continue the same.  He is due for Lupron and Zometa in 6 weeks.  He will return for lab, follow-up, Lupron and Zometa in 6 weeks.  He will contact the office in the interim with any problems.    Ned Card ANP/GNP-BC   12/20/2018  10:12 AM

## 2018-12-25 ENCOUNTER — Other Ambulatory Visit: Payer: Self-pay | Admitting: Oncology

## 2019-01-31 ENCOUNTER — Inpatient Hospital Stay: Payer: Self-pay | Attending: Nurse Practitioner

## 2019-01-31 ENCOUNTER — Telehealth: Payer: Self-pay | Admitting: Oncology

## 2019-01-31 ENCOUNTER — Inpatient Hospital Stay: Payer: Self-pay

## 2019-01-31 ENCOUNTER — Other Ambulatory Visit: Payer: Self-pay

## 2019-01-31 ENCOUNTER — Inpatient Hospital Stay (HOSPITAL_BASED_OUTPATIENT_CLINIC_OR_DEPARTMENT_OTHER): Payer: Self-pay | Admitting: Oncology

## 2019-01-31 VITALS — BP 137/80 | HR 93 | Temp 97.7°F | Resp 18

## 2019-01-31 VITALS — BP 148/83 | HR 109 | Temp 97.7°F | Resp 18 | Ht 60.0 in | Wt 152.1 lb

## 2019-01-31 DIAGNOSIS — D539 Nutritional anemia, unspecified: Secondary | ICD-10-CM

## 2019-01-31 DIAGNOSIS — C179 Malignant neoplasm of small intestine, unspecified: Secondary | ICD-10-CM

## 2019-01-31 DIAGNOSIS — C61 Malignant neoplasm of prostate: Secondary | ICD-10-CM | POA: Insufficient documentation

## 2019-01-31 DIAGNOSIS — C7800 Secondary malignant neoplasm of unspecified lung: Secondary | ICD-10-CM

## 2019-01-31 DIAGNOSIS — C7951 Secondary malignant neoplasm of bone: Secondary | ICD-10-CM | POA: Insufficient documentation

## 2019-01-31 DIAGNOSIS — E119 Type 2 diabetes mellitus without complications: Secondary | ICD-10-CM | POA: Insufficient documentation

## 2019-01-31 LAB — CMP (CANCER CENTER ONLY)
ALT: 46 U/L — ABNORMAL HIGH (ref 0–44)
AST: 19 U/L (ref 15–41)
Albumin: 3.5 g/dL (ref 3.5–5.0)
Alkaline Phosphatase: 87 U/L (ref 38–126)
Anion gap: 13 (ref 5–15)
BUN: 24 mg/dL — ABNORMAL HIGH (ref 8–23)
CO2: 23 mmol/L (ref 22–32)
Calcium: 9.4 mg/dL (ref 8.9–10.3)
Chloride: 100 mmol/L (ref 98–111)
Creatinine: 0.91 mg/dL (ref 0.61–1.24)
GFR, Est AFR Am: >60 mL/min (ref >60)
GFR, Estimated: >60 mL/min (ref >60)
Glucose, Bld: 202 mg/dL — ABNORMAL HIGH (ref 70–99)
Potassium: 3.7 mmol/L (ref 3.5–5.1)
Sodium: 136 mmol/L (ref 135–145)
Total Bilirubin: 0.5 mg/dL (ref 0.3–1.2)
Total Protein: 6.9 g/dL (ref 6.5–8.1)

## 2019-01-31 LAB — CBC WITH DIFFERENTIAL (CANCER CENTER ONLY)
Abs Immature Granulocytes: 0.08 10*3/uL — ABNORMAL HIGH (ref 0.00–0.07)
Basophils Absolute: 0 10*3/uL (ref 0.0–0.1)
Basophils Relative: 0 %
Eosinophils Absolute: 0.1 10*3/uL (ref 0.0–0.5)
Eosinophils Relative: 1 %
HCT: 32.5 % — ABNORMAL LOW (ref 39.0–52.0)
Hemoglobin: 9.9 g/dL — ABNORMAL LOW (ref 13.0–17.0)
Immature Granulocytes: 1 %
Lymphocytes Relative: 19 %
Lymphs Abs: 1.7 10*3/uL (ref 0.7–4.0)
MCH: 32.7 pg (ref 26.0–34.0)
MCHC: 30.5 g/dL (ref 30.0–36.0)
MCV: 107.3 fL — ABNORMAL HIGH (ref 80.0–100.0)
Monocytes Absolute: 0.7 10*3/uL (ref 0.1–1.0)
Monocytes Relative: 8 %
Neutro Abs: 6.4 10*3/uL (ref 1.7–7.7)
Neutrophils Relative %: 71 %
Platelet Count: 231 10*3/uL (ref 150–400)
RBC: 3.03 MIL/uL — ABNORMAL LOW (ref 4.22–5.81)
RDW: 15.1 % (ref 11.5–15.5)
WBC Count: 9.1 10*3/uL (ref 4.0–10.5)
nRBC: 0 % (ref 0.0–0.2)

## 2019-01-31 MED ORDER — LEUPROLIDE ACETATE (3 MONTH) 22.5 MG IM KIT
22.5000 mg | PACK | Freq: Once | INTRAMUSCULAR | Status: AC
  Administered 2019-01-31: 12:00:00 22.5 mg via INTRAMUSCULAR

## 2019-01-31 MED ORDER — SODIUM CHLORIDE 0.9 % IV SOLN
INTRAVENOUS | Status: DC
  Administered 2019-01-31: 12:00:00 via INTRAVENOUS
  Filled 2019-01-31: qty 250

## 2019-01-31 MED ORDER — ZOLEDRONIC ACID 4 MG/100ML IV SOLN
4.0000 mg | Freq: Once | INTRAVENOUS | Status: AC
  Administered 2019-01-31: 12:00:00 4 mg via INTRAVENOUS
  Filled 2019-01-31: qty 100

## 2019-01-31 MED ORDER — LEUPROLIDE ACETATE (3 MONTH) 22.5 MG IM KIT
PACK | INTRAMUSCULAR | Status: AC
  Filled 2019-01-31: qty 22.5

## 2019-01-31 NOTE — Patient Instructions (Signed)
Leuprolide injection What is this medicine? LEUPROLIDE (loo PROE lide) is a man-made hormone. It is used to treat the symptoms of prostate cancer. This medicine may also be used to treat children with early onset of puberty. It may be used for other hormonal conditions. This medicine may be used for other purposes; ask your health care provider or pharmacist if you have questions. COMMON BRAND NAME(S): Lupron What should I tell my health care provider before I take this medicine? They need to know if you have any of these conditions: -diabetes -heart disease or previous heart attack -high blood pressure -high cholesterol -pain or difficulty passing urine -spinal cord metastasis -stroke -tobacco smoker -an unusual or allergic reaction to leuprolide, benzyl alcohol, other medicines, foods, dyes, or preservatives -pregnant or trying to get pregnant -breast-feeding How should I use this medicine? This medicine is for injection under the skin or into a muscle. You will be taught how to prepare and give this medicine. Use exactly as directed. Take your medicine at regular intervals. Do not take your medicine more often than directed. It is important that you put your used needles and syringes in a special sharps container. Do not put them in a trash can. If you do not have a sharps container, call your pharmacist or healthcare provider to get one. A special MedGuide will be given to you by the pharmacist with each prescription and refill. Be sure to read this information carefully each time. Talk to your pediatrician regarding the use of this medicine in children. While this medicine may be prescribed for children as young as 8 years for selected conditions, precautions do apply. Overdosage: If you think you have taken too much of this medicine contact a poison control center or emergency room at once. NOTE: This medicine is only for you. Do not share this medicine with others. What if I miss a  dose? If you miss a dose, take it as soon as you can. If it is almost time for your next dose, take only that dose. Do not take double or extra doses. What may interact with this medicine? Do not take this medicine with any of the following medications: -chasteberry This medicine may also interact with the following medications: -herbal or dietary supplements, like black cohosh or DHEA -male hormones, like estrogens or progestins and birth control pills, patches, rings, or injections -male hormones, like testosterone This list may not describe all possible interactions. Give your health care provider a list of all the medicines, herbs, non-prescription drugs, or dietary supplements you use. Also tell them if you smoke, drink alcohol, or use illegal drugs. Some items may interact with your medicine. What should I watch for while using this medicine? Visit your doctor or health care professional for regular checks on your progress. During the first week, your symptoms may get worse, but then will improve as you continue your treatment. You may get hot flashes, increased bone pain, increased difficulty passing urine, or an aggravation of nerve symptoms. Discuss these effects with your doctor or health care professional, some of them may improve with continued use of this medicine. Male patients may experience a menstrual cycle or spotting during the first 2 months of therapy with this medicine. If this continues, contact your doctor or health care professional. What side effects may I notice from receiving this medicine? Side effects that you should report to your doctor or health care professional as soon as possible: -allergic reactions like skin rash, itching or   hives, swelling of the face, lips, or tongue -breathing problems -chest pain -depression or memory disorders -pain in your legs or groin -pain at site where injected -severe headache -swelling of the feet and legs -visual  changes -vomiting Side effects that usually do not require medical attention (report to your doctor or health care professional if they continue or are bothersome): -breast swelling or tenderness -decrease in sex drive or performance -diarrhea -hot flashes -loss of appetite -muscle, joint, or bone pains -nausea -redness or irritation at site where injected -skin problems or acne This list may not describe all possible side effects. Call your doctor for medical advice about side effects. You may report side effects to FDA at 1-800-FDA-1088. Where should I keep my medicine? Keep out of the reach of children. Store below 25 degrees C (77 degrees F). Do not freeze. Protect from light. Do not use if it is not clear or if there are particles present. Throw away any unused medicine after the expiration date. NOTE: This sheet is a summary. It may not cover all possible information. If you have questions about this medicine, talk to your doctor, pharmacist, or health care provider.  2019 Elsevier/Gold Standard (2016-05-02 10:54:35) Zoledronic Acid injection (Hypercalcemia, Oncology) What is this medicine? ZOLEDRONIC ACID (ZOE le dron ik AS id) lowers the amount of calcium loss from bone. It is used to treat too much calcium in your blood from cancer. It is also used to prevent complications of cancer that has spread to the bone. This medicine may be used for other purposes; ask your health care provider or pharmacist if you have questions. COMMON BRAND NAME(S): Zometa What should I tell my health care provider before I take this medicine? They need to know if you have any of these conditions: -aspirin-sensitive asthma -cancer, especially if you are receiving medicines used to treat cancer -dental disease or wear dentures -infection -kidney disease -receiving corticosteroids like dexamethasone or prednisone -an unusual or allergic reaction to zoledronic acid, other medicines, foods, dyes, or  preservatives -pregnant or trying to get pregnant -breast-feeding How should I use this medicine? This medicine is for infusion into a vein. It is given by a health care professional in a hospital or clinic setting. Talk to your pediatrician regarding the use of this medicine in children. Special care may be needed. Overdosage: If you think you have taken too much of this medicine contact a poison control center or emergency room at once. NOTE: This medicine is only for you. Do not share this medicine with others. What if I miss a dose? It is important not to miss your dose. Call your doctor or health care professional if you are unable to keep an appointment. What may interact with this medicine? -certain antibiotics given by injection -NSAIDs, medicines for pain and inflammation, like ibuprofen or naproxen -some diuretics like bumetanide, furosemide -teriparatide -thalidomide This list may not describe all possible interactions. Give your health care provider a list of all the medicines, herbs, non-prescription drugs, or dietary supplements you use. Also tell them if you smoke, drink alcohol, or use illegal drugs. Some items may interact with your medicine. What should I watch for while using this medicine? Visit your doctor or health care professional for regular checkups. It may be some time before you see the benefit from this medicine. Do not stop taking your medicine unless your doctor tells you to. Your doctor may order blood tests or other tests to see how you are doing. Women  should inform their doctor if they wish to become pregnant or think they might be pregnant. There is a potential for serious side effects to an unborn child. Talk to your health care professional or pharmacist for more information. You should make sure that you get enough calcium and vitamin D while you are taking this medicine. Discuss the foods you eat and the vitamins you take with your health care  professional. Some people who take this medicine have severe bone, joint, and/or muscle pain. This medicine may also increase your risk for jaw problems or a broken thigh bone. Tell your doctor right away if you have severe pain in your jaw, bones, joints, or muscles. Tell your doctor if you have any pain that does not go away or that gets worse. Tell your dentist and dental surgeon that you are taking this medicine. You should not have major dental surgery while on this medicine. See your dentist to have a dental exam and fix any dental problems before starting this medicine. Take good care of your teeth while on this medicine. Make sure you see your dentist for regular follow-up appointments. What side effects may I notice from receiving this medicine? Side effects that you should report to your doctor or health care professional as soon as possible: -allergic reactions like skin rash, itching or hives, swelling of the face, lips, or tongue -anxiety, confusion, or depression -breathing problems -changes in vision -eye pain -feeling faint or lightheaded, falls -jaw pain, especially after dental work -mouth sores -muscle cramps, stiffness, or weakness -redness, blistering, peeling or loosening of the skin, including inside the mouth -trouble passing urine or change in the amount of urine Side effects that usually do not require medical attention (report to your doctor or health care professional if they continue or are bothersome): -bone, joint, or muscle pain -constipation -diarrhea -fever -hair loss -irritation at site where injected -loss of appetite -nausea, vomiting -stomach upset -trouble sleeping -trouble swallowing -weak or tired This list may not describe all possible side effects. Call your doctor for medical advice about side effects. You may report side effects to FDA at 1-800-FDA-1088. Where should I keep my medicine? This drug is given in a hospital or clinic and will not  be stored at home. NOTE: This sheet is a summary. It may not cover all possible information. If you have questions about this medicine, talk to your doctor, pharmacist, or health care provider.  2019 Elsevier/Gold Standard (2014-02-08 14:19:39)

## 2019-01-31 NOTE — Telephone Encounter (Signed)
Scheduled appt per 5/7 los. °

## 2019-01-31 NOTE — Progress Notes (Signed)
Brandon Peters   Diagnosis: Prostate cancer, small bowel carcinoma  INTERVAL HISTORY:   Brandon Peters returns for a scheduled visit.  He completed a final cycle of Xeloda beginning 12/24/2018.  No mouth sores.  He reports feeling well.  He is walking for exercise.  The skin changes at the hands and feet have improved.  Objective:  Vital signs in last 24 hours:  Blood pressure (!) 148/83, pulse (!) 109, temperature 97.7 F (36.5 C), temperature source Oral, resp. rate 18, height 5' (1.524 m), weight 152 lb 1.6 oz (69 kg), SpO2 99 %.    Skin: Hyperpigmentation of the hands and feet with skin thickening-improved   Lab Results:  Lab Results  Component Value Date   WBC 9.1 01/31/2019   HGB 9.9 (L) 01/31/2019   HCT 32.5 (L) 01/31/2019   MCV 107.3 (H) 01/31/2019   PLT 231 01/31/2019   NEUTROABS 6.4 01/31/2019    CMP  Lab Results  Component Value Date   NA 136 01/31/2019   K 3.7 01/31/2019   CL 100 01/31/2019   CO2 23 01/31/2019   GLUCOSE 202 (H) 01/31/2019   BUN 24 (H) 01/31/2019   CREATININE 0.91 01/31/2019   CALCIUM 9.4 01/31/2019   PROT 6.9 01/31/2019   ALBUMIN 3.5 01/31/2019   AST 19 01/31/2019   ALT 46 (H) 01/31/2019   ALKPHOS 87 01/31/2019   BILITOT 0.5 01/31/2019   GFRNONAA >60 01/31/2019   GFRAA >60 01/31/2019    Medications: I have reviewed the patient's current medications.   Assessment/Plan: 1. History of Severe anemia-status post a red cell transfusion 03/04/2016 2.. Prostate cancer  Extensive sclerotic bone metastases noted on a CT of the abdomen/pelvis 03/04/2016 and MRI abdomen 03/05/2016  Markedly elevated PSA  Lupron initiated 03/15/2016; Casodex 14 days beginning 03/15/2016  Prostate biopsy 03/18/2016  05/09/2016 PSA significantly improved at 21  Abirateroneinitiated08/15/2017  PSA less than 0.1 07/10/2018  PSA less than 0.1 08/30/2018  PSA less than 0.1 10/18/2018  PSA less than 0.1 11/29/2018   3. Symptoms of obstructive uropathy. Improved   4. Distal duodenal and descending colon masses  biopsy of the duodenal mass 03/04/2016 confirmed fragments of small bowel mucosa with focal high-grade dysplasia  Biopsy of the descending colon "polyps" revealed fragments of a tubulovillous adenoma  CT 08/29/2017-similar soft tissue thickening in the region of the splenic flexure of the colon suboptimally evaluated due to lack of colonic enteric contrast  Referred to Dr. Benson Norway  [facility]  Resection of descending colon polyps 01/31/2018, tubulovillous adenomas with high-grade dysplasia  Attempted endoscopic resection of the fourth segment duodenal "polyp"on 05/09/2018, firm unresectable lesion, biopsy revealed moderately differentiated adenocarcinoma  CT abdomen/pelvis 05/25/2018-heterogeneously enhancing right lower pole renal lesion; diffuse sclerotic osseous metastatic lesions compatible with history of prostate cancer  CT chest 05/25/2018-no pulmonary metastasis. Diffuse osseous metastasis involving the axial and appendicular skeleton.  06/19/2018-sleeve resection of third and fourth portions of duodenum with primary end-to-end anastomosis, feeding gastrojejunostomy tube placement, Dr. Stark Klein differentiated adenocarcinoma duodenum, 3 cm;tumor invades the muscularis propria; lymphovascular invasion present; negative margins; 4 of 11 involved lymph nodes;soft tissue deposits also seen in the mesentery; all mucosal margins and soft tissue margins negative but 2 of the positive nodes are found in the soft tissue margin submitted for frozen section(pT2 pN2); second mucosal lesion 1.2 cm from the proximal margin, tubular adenoma with focal severe dysplasia  Intact expression of mismatch repair proteins by IHC; microsatellite stable  Cycle  1 adjuvant Xeloda 07/23/2018  Cycle 2 adjuvant Xeloda 08/13/2018  Cycle 3 adjuvant Xeloda 09/10/2018 (dose reduced to 1000 mg twice daily  for 14 days)  Cycle 4 adjuvant Xeloda 10/01/2018 (dose increased to 1500 mg every morning and 1000 mg every afternoon for 14 days)  Cycle 5 adjuvant Xeloda 10/22/2018  Cycle 6 adjuvant Xeloda 11/12/2018  Cycle 7 adjuvant Xeloda 12/03/2018  Cycle 8 adjuvant Xeloda 12/24/2018  5. Anorexia/weight loss-resolved  6. Undescended testicles  7. Right renal mass. CT 08/29/2017-right renal mass involving the lower pole was partially calcified and does demonstrate enhancement following contrast most consistent with renal cell carcinoma. Lesion not significantly enlarged compared with the prior CT. Followed by urology.  8. Diabetes     Disposition: Brandon Peters appears well.  He has completed the course of adjuvant chemotherapy for treatment of small bowel cancer.  Continues abiraterone and Lupron for treatment of metastatic prostate cancer.  He will receive Lupron and Zometa today. He has persistent macrocytic anemia, likely secondary to capecitabine.  This should improve over the next few months.  He will return for an office visit and Lupron/and Zometa in 3 months.  We will follow-up on the PSA from today.  [name], MD  01/31/2019  10:50 AM

## 2019-02-01 LAB — PROSTATE-SPECIFIC AG, SERUM (LABCORP): Prostate Specific Ag, Serum: <0.1 ng/mL (ref 0.0–4.0)

## 2019-02-20 MED FILL — predniSONE 5 MG TABS: 5 | 90 days supply | Qty: 180 | Fill #0

## 2019-02-25 ENCOUNTER — Other Ambulatory Visit: Payer: Self-pay | Admitting: Oncology

## 2019-02-25 DIAGNOSIS — C61 Malignant neoplasm of prostate: Secondary | ICD-10-CM

## 2019-02-25 DIAGNOSIS — C179 Malignant neoplasm of small intestine, unspecified: Secondary | ICD-10-CM

## 2019-02-25 DIAGNOSIS — Z85068 Personal history of other malignant neoplasm of small intestine: Secondary | ICD-10-CM

## 2019-02-25 NOTE — Telephone Encounter (Signed)
01-31-2019: K+ = 3.7    Next scheduled F/U 05-03-2019.

## 2019-03-01 ENCOUNTER — Telehealth: Payer: Self-pay

## 2019-03-01 NOTE — Telephone Encounter (Signed)
Oral Oncology Patient Advocate Encounter  The patients current enrollment with [employer] to receive his Fabio Asa will expire on 04/03/19.  I have filled out a new application for him. I called him to make sure all of the information we have is still correct and it is.  [employer] and [employer] is requiring a tax return as income documentation if the patient files a tax return. I spoke to the patient and he will bring that income documentation and sign the application on Monday 6/8, I will leave it at the front desk Monday morning.  This encounter will be updated until final determination.  [place] Patient [place] Phone [phone] Fax [phone] 03/01/2019   9:30 AM

## 2019-03-04 NOTE — Telephone Encounter (Signed)
Oral Oncology Patient Advocate Encounter  I faxed the completed application today.  I received a successful fax notification.  This encounter will be updated until final determination.  [place] Patient Brandon Peters Phone [phone] Fax [phone] 03/04/2019   11:02 AM

## 2019-03-05 NOTE — Telephone Encounter (Signed)
Oral Oncology Patient Advocate Encounter  Called [employer] to follow up on application. Application has not been uploaded as of today, was advised that it could take up to 48 hours to appear in system. Then 2-3 additional days to be processed.  Rep Jonette Pesa, advised to try back tomorrow afternoon, 03/06/19, to confirm receipt.   Phone: [phone]  1:48 PM Beatriz Chancellor, CPhT

## 2019-03-06 NOTE — Telephone Encounter (Signed)
Oral Oncology Patient Advocate Encounter  I called [employer] to follow up on the [facility] renewal application. The representative stated that it has still not come up in their incoming faxes, she advised to fax it again.  I am not in the office today and will not be in the office tomorrow but I will ask the patient advocate filling in for me to refax this application.  This encounter will be updated until final determination.  [place] Patient [place] Phone [phone] Fax [phone] 03/06/2019   4:10 PM

## 2019-03-07 NOTE — Telephone Encounter (Signed)
Oncology Patient Advocate Encounter  Submitted Patient Assistance Application to [facility]  for [place]. Program advises that it takes up to 24-48 hours to upload into their system. Will follow up.  Fax# [phone] Phone# [phone]  8:30 AM Beatriz Chancellor, CPhT

## 2019-03-08 ENCOUNTER — Telehealth: Payer: Self-pay | Admitting: Pharmacist

## 2019-03-08 NOTE — Telephone Encounter (Signed)
Oral Chemotherapy Pharmacist Encounter   Spoke with patient today to follow up regarding patient's oral chemotherapy medication: Zytiga (abiraterone) for the treatment of metastatic, castration-sensitive prostate cancer, in conjunction with prednisone and androgen deprivation therapy (Lupron injections), planned duration until disease progression or unacceptable toxicity  Original Start date of Zytiga: 05/09/2016  Pt is doing well today   Pt reports 0 tablets/doses of Zytiga 250mg  tablets, 4 tablets (1000mg ) by mouth once daily on an empty stomach, 1 hour before or 2 hours after food, missed in the last month.   Pt reports the following side effects:   No side effects to report from [facility]  Pertinent labs reviewed: OK for continued treatment.  Confirmed office visit on 05/03/2019 with patient. We discussed re-enrollment with [employer] for his Zytiga.  All questions answered. Patient knows to call the office with questions or concerns. Oral Oncology Clinic will continue to follow.  Brandon Peters, PharmD, BCPS, BCOP  03/08/2019  10:44 AM  Oral Oncology Clinic [phone]

## 2019-03-08 NOTE — Telephone Encounter (Signed)
Oral Oncology Pharmacist Encounter  Received notification from [facility] that patient has been successfully enrolled to receive Zytiga at $0 out of pocket cost through 03/07/2020.  Record ID: [id number]  I called and updated patient.  He expressed understanding and appreciation. All questions answered. He knows to call the office with any additional questions or concerns.  Johny Drilling, PharmD, BCPS, BCOP  03/08/2019 10:40 AM [facility] [phone]

## 2019-03-12 ENCOUNTER — Other Ambulatory Visit: Payer: Self-pay | Admitting: Oncology

## 2019-03-12 DIAGNOSIS — C61 Malignant neoplasm of prostate: Secondary | ICD-10-CM

## 2019-05-03 ENCOUNTER — Other Ambulatory Visit: Payer: Self-pay

## 2019-05-03 ENCOUNTER — Inpatient Hospital Stay: Payer: Self-pay

## 2019-05-03 ENCOUNTER — Inpatient Hospital Stay: Payer: Self-pay | Attending: Nurse Practitioner

## 2019-05-03 ENCOUNTER — Inpatient Hospital Stay (HOSPITAL_BASED_OUTPATIENT_CLINIC_OR_DEPARTMENT_OTHER): Payer: Self-pay | Admitting: Oncology

## 2019-05-03 VITALS — HR 95

## 2019-05-03 VITALS — BP 143/80 | HR 117 | Temp 98.0°F | Resp 18 | Ht 60.0 in | Wt 157.7 lb

## 2019-05-03 DIAGNOSIS — Z79899 Other long term (current) drug therapy: Secondary | ICD-10-CM | POA: Insufficient documentation

## 2019-05-03 DIAGNOSIS — C7951 Secondary malignant neoplasm of bone: Secondary | ICD-10-CM | POA: Insufficient documentation

## 2019-05-03 DIAGNOSIS — C61 Malignant neoplasm of prostate: Secondary | ICD-10-CM | POA: Insufficient documentation

## 2019-05-03 DIAGNOSIS — C7800 Secondary malignant neoplasm of unspecified lung: Secondary | ICD-10-CM | POA: Insufficient documentation

## 2019-05-03 DIAGNOSIS — C179 Malignant neoplasm of small intestine, unspecified: Secondary | ICD-10-CM

## 2019-05-03 DIAGNOSIS — N2889 Other specified disorders of kidney and ureter: Secondary | ICD-10-CM | POA: Insufficient documentation

## 2019-05-03 DIAGNOSIS — E119 Type 2 diabetes mellitus without complications: Secondary | ICD-10-CM | POA: Insufficient documentation

## 2019-05-03 DIAGNOSIS — Q5320 Undescended testicle, unspecified, bilateral: Secondary | ICD-10-CM | POA: Insufficient documentation

## 2019-05-03 LAB — CMP (CANCER CENTER ONLY)
ALT: 16 U/L (ref 0–44)
AST: 12 U/L — ABNORMAL LOW (ref 15–41)
Albumin: 3.4 g/dL — ABNORMAL LOW (ref 3.5–5.0)
Alkaline Phosphatase: 82 U/L (ref 38–126)
Anion gap: 14 (ref 5–15)
BUN: 22 mg/dL (ref 8–23)
CO2: 22 mmol/L (ref 22–32)
Calcium: 9.6 mg/dL (ref 8.9–10.3)
Chloride: 101 mmol/L (ref 98–111)
Creatinine: 0.84 mg/dL (ref 0.61–1.24)
GFR, Est AFR Am: >60 mL/min (ref >60)
GFR, Estimated: >60 mL/min (ref >60)
Glucose, Bld: 204 mg/dL — ABNORMAL HIGH (ref 70–99)
Potassium: 3.6 mmol/L (ref 3.5–5.1)
Sodium: 137 mmol/L (ref 135–145)
Total Bilirubin: 0.3 mg/dL (ref 0.3–1.2)
Total Protein: 7.1 g/dL (ref 6.5–8.1)

## 2019-05-03 LAB — CBC WITH DIFFERENTIAL (CANCER CENTER ONLY)
Abs Immature Granulocytes: 0.09 10*3/uL — ABNORMAL HIGH (ref 0.00–0.07)
Basophils Absolute: 0 10*3/uL (ref 0.0–0.1)
Basophils Relative: 0 %
Eosinophils Absolute: 0.1 10*3/uL (ref 0.0–0.5)
Eosinophils Relative: 1 %
HCT: 32.2 % — ABNORMAL LOW (ref 39.0–52.0)
Hemoglobin: 10 g/dL — ABNORMAL LOW (ref 13.0–17.0)
Immature Granulocytes: 1 %
Lymphocytes Relative: 22 %
Lymphs Abs: 2.2 10*3/uL (ref 0.7–4.0)
MCH: 28.7 pg (ref 26.0–34.0)
MCHC: 31.1 g/dL (ref 30.0–36.0)
MCV: 92.5 fL (ref 80.0–100.0)
Monocytes Absolute: 0.8 10*3/uL (ref 0.1–1.0)
Monocytes Relative: 8 %
Neutro Abs: 6.8 10*3/uL (ref 1.7–7.7)
Neutrophils Relative %: 68 %
Platelet Count: 312 10*3/uL (ref 150–400)
RBC: 3.48 MIL/uL — ABNORMAL LOW (ref 4.22–5.81)
RDW: 13.6 % (ref 11.5–15.5)
WBC Count: 10 10*3/uL (ref 4.0–10.5)
nRBC: 0 % (ref 0.0–0.2)

## 2019-05-03 LAB — CEA (IN HOUSE-CHCC): CEA (CHCC-In House): 3.31 ng/mL (ref 0.00–5.00)

## 2019-05-03 MED ORDER — SODIUM CHLORIDE 0.9 % IV SOLN
INTRAVENOUS | Status: DC
  Administered 2019-05-03: 10:00:00 via INTRAVENOUS
  Filled 2019-05-03: qty 250

## 2019-05-03 MED ORDER — LEUPROLIDE ACETATE (3 MONTH) 22.5 MG IM KIT
22.5000 mg | PACK | Freq: Once | INTRAMUSCULAR | Status: AC
  Administered 2019-05-03: 22.5 mg via INTRAMUSCULAR
  Filled 2019-05-03: qty 22.5

## 2019-05-03 MED ORDER — ZOLEDRONIC ACID 4 MG/100ML IV SOLN
4.0000 mg | Freq: Once | INTRAVENOUS | Status: AC
  Administered 2019-05-03: 4 mg via INTRAVENOUS
  Filled 2019-05-03: qty 100

## 2019-05-03 NOTE — Patient Instructions (Signed)
Zoledronic Acid injection (Hypercalcemia, Oncology) What is this medicine? ZOLEDRONIC ACID (ZOE le dron ik AS id) lowers the amount of calcium loss from bone. It is used to treat too much calcium in your blood from cancer. It is also used to prevent complications of cancer that has spread to the bone. This medicine may be used for other purposes; ask your health care provider or pharmacist if you have questions. COMMON BRAND NAME(S): Zometa What should I tell my health care provider before I take this medicine? They need to know if you have any of these conditions:  aspirin-sensitive asthma  cancer, especially if you are receiving medicines used to treat cancer  dental disease or wear dentures  infection  kidney disease  receiving corticosteroids like dexamethasone or prednisone  an unusual or allergic reaction to zoledronic acid, other medicines, foods, dyes, or preservatives  pregnant or trying to get pregnant  breast-feeding How should I use this medicine? This medicine is for infusion into a vein. It is given by a health care professional in a hospital or clinic setting. Talk to your pediatrician regarding the use of this medicine in children. Special care may be needed. Overdosage: If you think you have taken too much of this medicine contact a poison control center or emergency room at once. NOTE: This medicine is only for you. Do not share this medicine with others. What if I miss a dose? It is important not to miss your dose. Call your doctor or health care professional if you are unable to keep an appointment. What may interact with this medicine?  certain antibiotics given by injection  NSAIDs, medicines for pain and inflammation, like ibuprofen or naproxen  some diuretics like bumetanide, furosemide  teriparatide  thalidomide This list may not describe all possible interactions. Give your health care provider a list of all the medicines, herbs, non-prescription  drugs, or dietary supplements you use. Also tell them if you smoke, drink alcohol, or use illegal drugs. Some items may interact with your medicine. What should I watch for while using this medicine? Visit your doctor or health care professional for regular checkups. It may be some time before you see the benefit from this medicine. Do not stop taking your medicine unless your doctor tells you to. Your doctor may order blood tests or other tests to see how you are doing. Women should inform their doctor if they wish to become pregnant or think they might be pregnant. There is a potential for serious side effects to an unborn child. Talk to your health care professional or pharmacist for more information. You should make sure that you get enough calcium and vitamin D while you are taking this medicine. Discuss the foods you eat and the vitamins you take with your health care professional. Some people who take this medicine have severe bone, joint, and/or muscle pain. This medicine may also increase your risk for jaw problems or a broken thigh bone. Tell your doctor right away if you have severe pain in your jaw, bones, joints, or muscles. Tell your doctor if you have any pain that does not go away or that gets worse. Tell your dentist and dental surgeon that you are taking this medicine. You should not have major dental surgery while on this medicine. See your dentist to have a dental exam and fix any dental problems before starting this medicine. Take good care of your teeth while on this medicine. Make sure you see your dentist for regular follow-up   appointments. °What side effects may I notice from receiving this medicine? °Side effects that you should report to your doctor or health care professional as soon as possible: °· allergic reactions like skin rash, itching or hives, swelling of the face, lips, or tongue °· anxiety, confusion, or depression °· breathing problems °· changes in vision °· eye  pain °· feeling faint or lightheaded, falls °· jaw pain, especially after dental work °· mouth sores °· muscle cramps, stiffness, or weakness °· redness, blistering, peeling or loosening of the skin, including inside the mouth °· trouble passing urine or change in the amount of urine °Side effects that usually do not require medical attention (report to your doctor or health care professional if they continue or are bothersome): °· bone, joint, or muscle pain °· constipation °· diarrhea °· fever °· hair loss °· irritation at site where injected °· loss of appetite °· nausea, vomiting °· stomach upset °· trouble sleeping °· trouble swallowing °· weak or tired °This list may not describe all possible side effects. Call your doctor for medical advice about side effects. You may report side effects to FDA at 1-800-FDA-1088. °Where should I keep my medicine? °This drug is given in a hospital or clinic and will not be stored at home. °NOTE: This sheet is a summary. It may not cover all possible information. If you have questions about this medicine, talk to your doctor, pharmacist, or health care provider. °© 2020 Elsevier/Gold Standard (2014-02-08 14:19:39) ° ° ° °Leuprolide injection °What is this medicine? °LEUPROLIDE (loo PROE lide) is a man-made hormone. It is used to treat the symptoms of prostate cancer. This medicine may also be used to treat children with early onset of puberty. It may be used for other hormonal conditions. °This medicine may be used for other purposes; ask your health care provider or pharmacist if you have questions. °COMMON BRAND NAME(S): Lupron °What should I tell my health care provider before I take this medicine? °They need to know if you have any of these conditions: °· diabetes °· heart disease or previous heart attack °· high blood pressure °· high cholesterol °· pain or difficulty passing urine °· spinal cord metastasis °· stroke °· tobacco smoker °· an unusual or allergic reaction to  leuprolide, benzyl alcohol, other medicines, foods, dyes, or preservatives °· pregnant or trying to get pregnant °· breast-feeding °How should I use this medicine? °This medicine is for injection under the skin or into a muscle. You will be taught how to prepare and give this medicine. Use exactly as directed. Take your medicine at regular intervals. Do not take your medicine more often than directed. °It is important that you put your used needles and syringes in a special sharps container. Do not put them in a trash can. If you do not have a sharps container, call your pharmacist or healthcare provider to get one. °A special MedGuide will be given to you by the pharmacist with each prescription and refill. Be sure to read this information carefully each time. °Talk to your pediatrician regarding the use of this medicine in children. While this medicine may be prescribed for children as young as 8 years for selected conditions, precautions do apply. °Overdosage: If you think you have taken too much of this medicine contact a poison control center or emergency room at once. °NOTE: This medicine is only for you. Do not share this medicine with others. °What if I miss a dose? °If you miss a dose, take   soon as you can. If it is almost time for your next dose, take only that dose. Do not take double or extra doses. What may interact with this medicine? Do not take this medicine with any of the following medications:  chasteberry This medicine may also interact with the following medications:  herbal or dietary supplements, like black cohosh or DHEA  male hormones, like estrogens or progestins and birth control pills, patches, rings, or injections  male hormones, like testosterone This list may not describe all possible interactions. Give your health care provider a list of all the medicines, herbs, non-prescription drugs, or dietary supplements you use. Also tell them if you smoke, drink alcohol, or  use illegal drugs. Some items may interact with your medicine. What should I watch for while using this medicine? Visit your doctor or health care professional for regular checks on your progress. During the first week, your symptoms may get worse, but then will improve as you continue your treatment. You may get hot flashes, increased bone pain, increased difficulty passing urine, or an aggravation of nerve symptoms. Discuss these effects with your doctor or health care professional, some of them may improve with continued use of this medicine. Male patients may experience a menstrual cycle or spotting during the first 2 months of therapy with this medicine. If this continues, contact your doctor or health care professional. This medicine may increase blood sugar. Ask your healthcare provider if changes in diet or medicines are needed if you have diabetes. What side effects may I notice from receiving this medicine? Side effects that you should report to your doctor or health care professional as soon as possible:  allergic reactions like skin rash, itching or hives, swelling of the face, lips, or tongue  breathing problems  chest pain  depression or memory disorders  pain in your legs or groin  pain at site where injected  severe headache  signs and symptoms of high blood sugar such as being more thirsty or hungry or having to urinate more than normal. You may also feel very tired or have blurry vision  swelling of the feet and legs  visual changes  vomiting Side effects that usually do not require medical attention (report to your doctor or health care professional if they continue or are bothersome):  breast swelling or tenderness  decrease in sex drive or performance  diarrhea  hot flashes  loss of appetite  muscle, joint, or bone pains  nausea  redness or irritation at site where injected  skin problems or acne This list may not describe all possible side  effects. Call your doctor for medical advice about side effects. You may report side effects to FDA at 1-800-FDA-1088. Where should I keep my medicine? Keep out of the reach of children. Store below 25 degrees C (77 degrees F). Do not freeze. Protect from light. Do not use if it is not clear or if there are particles present. Throw away any unused medicine after the expiration date. NOTE: This sheet is a summary. It may not cover all possible information. If you have questions about this medicine, talk to your doctor, pharmacist, or health care provider.  2020 Elsevier/Gold Standard (2018-07-12 09:52:48)  Coronavirus (COVID-19) Are you at risk?  Are you at risk for the Coronavirus (COVID-19)?  To be considered HIGH RISK for Coronavirus (COVID-19), you have to meet the following criteria:  . Traveled to [place], [place], [place], [place] or [place]; or in the [place] to  [place], [place], [place], or [place]; and have fever, cough, and shortness of breath within the last 2 weeks of travel OR . Been in close contact with a person diagnosed with COVID-19 within the last 2 weeks and have fever, cough, and shortness of breath . IF YOU DO NOT MEET THESE CRITERIA, YOU ARE CONSIDERED LOW RISK FOR COVID-19.  What to do if you are HIGH RISK for COVID-19?  Marland Kitchen If you are having a medical emergency, call 911. . Seek medical care right away. Before you go to a doctor's office, urgent care or emergency department, call ahead and tell them about your recent travel, contact with someone diagnosed with COVID-19, and your symptoms. You should receive instructions from your physician's office regarding next steps of care.  . When you arrive at healthcare provider, tell the healthcare staff immediately you have returned from visiting [place], [place], [place], [place] or [place]; or traveled in the [place] to [place], [place], [place], or [place]; in the last two weeks or you have been in  close contact with a person diagnosed with COVID-19 in the last 2 weeks.   . Tell the health care staff about your symptoms: fever, cough and shortness of breath. . After you have been seen by a medical provider, you will be either: o Tested for (COVID-19) and discharged home on quarantine except to seek medical care if symptoms worsen, and asked to  - Stay home and avoid contact with others until you get your results (4-5 days)  - Avoid travel on public transportation if possible (such as bus, train, or airplane) or o Sent to the Emergency Department by EMS for evaluation, COVID-19 testing, and possible admission depending on your condition and test results.  What to do if you are LOW RISK for COVID-19?  Reduce your risk of any infection by using the same precautions used for avoiding the common cold or flu:  Marland Kitchen Wash your hands often with soap and warm water for at least 20 seconds.  If soap and water are not readily available, use an alcohol-based hand sanitizer with at least 60% alcohol.  . If coughing or sneezing, cover your mouth and nose by coughing or sneezing into the elbow areas of your shirt or coat, into a tissue or into your sleeve (not your hands). . Avoid shaking hands with others and consider head nods or verbal greetings only. . Avoid touching your eyes, nose, or mouth with unwashed hands.  . Avoid close contact with people who are sick. . Avoid places or events with large numbers of people in one location, like concerts or sporting events. . Carefully consider travel plans you have or are making. . If you are planning any travel outside or inside the [place], visit the CDC's Travelers' Health webpage for the latest health notices. . If you have some symptoms but not all symptoms, continue to monitor at home and seek medical attention if your symptoms worsen. . If you are having a medical emergency, call 911.   Cross /  e-Visit: [email or web address]         [facility]: [facility]: [phone]                   [facility]: [phone]

## 2019-05-03 NOTE — Progress Notes (Signed)
[facility] OFFICE PROGRESS NOTE   Diagnosis: Prostate cancer, small bowel carcinoma  INTERVAL HISTORY:   [name] returns as scheduled.  He continues [employer] investment [employer].  He feels well.  No difficulty with urination.  Good appetite.  He reports chronic mild swelling in the left lower leg.  Objective:  Vital signs in last 24 hours:  Blood pressure (!) 143/80, pulse (!) 117, temperature 98 F (36.7 C), temperature source Oral, resp. rate 18, height 5' (1.524 m), weight 157 lb 11.2 oz (71.5 kg), SpO2 99 %.    Limited physical examination secondary to distancing with the COVID pandemic GI: No hepatosplenomegaly, no mass, nontender Vascular: Trace pitting edema at the left greater than right lower leg, no erythema or tenderness  Skin: Dryness of the palms    Lab Results:  Lab Results  Component Value Date   WBC 10.0 05/03/2019   HGB 10.0 (L) 05/03/2019   HCT 32.2 (L) 05/03/2019   MCV 92.5 05/03/2019   PLT 312 05/03/2019   NEUTROABS 6.8 05/03/2019    CMP  Lab Results  Component Value Date   NA 136 01/31/2019   K 3.7 01/31/2019   CL 100 01/31/2019   CO2 23 01/31/2019   GLUCOSE 202 (H) 01/31/2019   BUN 24 (H) 01/31/2019   CREATININE 0.91 01/31/2019   CALCIUM 9.4 01/31/2019   PROT 6.9 01/31/2019   ALBUMIN 3.5 01/31/2019   AST 19 01/31/2019   ALT 46 (H) 01/31/2019   ALKPHOS 87 01/31/2019   BILITOT 0.5 01/31/2019   GFRNONAA >60 01/31/2019   GFRAA >60 01/31/2019     Medications: I have reviewed the patient's current medications.   Assessment/Plan: 1. History of Severe anemia-status post a red cell transfusion 03/04/2016 2.. Prostate cancer  Extensive sclerotic bone metastases noted on a CT of the abdomen/pelvis 03/04/2016 and MRI abdomen 03/05/2016  Markedly elevated PSA  Lupron initiated 03/15/2016; Casodex 14 days beginning 03/15/2016  Prostate biopsy 03/18/2016  05/09/2016 PSA significantly improved at 21  Abirateroneinitiated08/15/2017   PSA less than 0.1 07/10/2018  PSA less than 0.1 08/30/2018  PSA less than 0.1 10/18/2018  PSA less than 0.1 11/29/2018  PSA less than 0.1 on 01/31/2019  3. Symptoms of obstructive uropathy. Improved   4. Distal duodenal and descending colon masses  biopsy of the duodenal mass 03/04/2016 confirmed fragments of small bowel mucosa with focal high-grade dysplasia  Biopsy of the descending colon "polyps" revealed fragments of a tubulovillous adenoma  CT 08/29/2017-similar soft tissue thickening in the region of the splenic flexure of the colon suboptimally evaluated due to lack of colonic enteric contrast  Referred to Dr. Benson Norway  Referred to [facility]  Resection of descending colon polyps 01/31/2018, tubulovillous adenomas with high-grade dysplasia  Attempted endoscopic resection of the fourth segment duodenal "polyp"on 05/09/2018, firm unresectable lesion, biopsy revealed moderately differentiated adenocarcinoma  CT abdomen/pelvis 05/25/2018-heterogeneously enhancing right lower pole renal lesion; diffuse sclerotic osseous metastatic lesions compatible with history of prostate cancer  CT chest 05/25/2018-no pulmonary metastasis. Diffuse osseous metastasis involving the axial and appendicular skeleton.  06/19/2018-sleeve resection of third and fourth portions of duodenum with primary end-to-end anastomosis, feeding gastrojejunostomy tube placement, Dr. Stark Klein differentiated adenocarcinoma duodenum, 3 cm;tumor invades the muscularis propria; lymphovascular invasion present; negative margins; 4 of 11 involved lymph nodes;soft tissue deposits also seen in the mesentery; all mucosal margins and soft tissue margins negative but 2 of the positive nodes are found in the soft tissue margin submitted for frozen section(pT2 pN2); second mucosal lesion  1.2 cm from the proximal margin, tubular adenoma with focal severe dysplasia  Intact expression of mismatch repair proteins by IHC; microsatellite  stable  Cycle 1 adjuvant Xeloda 07/23/2018  Cycle 2 adjuvant Xeloda 08/13/2018  Cycle 3 adjuvant Xeloda 09/10/2018 (dose reduced to 1000 mg twice daily for 14 days)  Cycle 4 adjuvant Xeloda 10/01/2018 (dose increased to 1500 mg every morning and 1000 mg every afternoon for 14 days)  Cycle 5 adjuvant Xeloda 10/22/2018  Cycle 6 adjuvant Xeloda 11/12/2018  Cycle 7 adjuvant Xeloda 12/03/2018  Cycle 8 adjuvant Xeloda 12/24/2018  5. Anorexia/weight loss-resolved  6. Undescended testicles  7. Right renal mass. CT 08/29/2017-right renal mass involving the lower pole was partially calcified and does demonstrate enhancement following contrast most consistent with renal cell carcinoma. Lesion not significantly enlarged compared with the prior CT. Followed by urology.  8. Diabetes   Disposition: [name] appears stable.  He will continue Zytiga.  He will receive Lupron and Zometa today. He has persistent mild anemia, likely secondary to chemotherapy he received earlier this year and prostate cancer.  [name] will return for an office visit and Lupron/Zometa in 3 months.  [name], MD  05/03/2019  9:14 AM

## 2019-05-04 LAB — PROSTATE-SPECIFIC AG, SERUM (LABCORP): Prostate Specific Ag, Serum: <0.1 ng/mL (ref 0.0–4.0)

## 2019-05-06 ENCOUNTER — Telehealth: Payer: Self-pay | Admitting: Oncology

## 2019-05-14 ENCOUNTER — Other Ambulatory Visit: Payer: Self-pay | Admitting: Oncology

## 2019-05-23 ENCOUNTER — Other Ambulatory Visit: Payer: Self-pay | Admitting: Oncology

## 2019-05-23 DIAGNOSIS — C179 Malignant neoplasm of small intestine, unspecified: Secondary | ICD-10-CM

## 2019-05-23 DIAGNOSIS — C61 Malignant neoplasm of prostate: Secondary | ICD-10-CM

## 2019-05-23 DIAGNOSIS — Z85068 Personal history of other malignant neoplasm of small intestine: Secondary | ICD-10-CM

## 2019-05-27 MED FILL — predniSONE 5 MG TABS: 5 | 90 days supply | Qty: 180 | Fill #1

## 2019-07-19 ENCOUNTER — Encounter: Payer: Self-pay | Admitting: Pharmacy Technician

## 2019-07-22 ENCOUNTER — Other Ambulatory Visit: Payer: Self-pay | Admitting: Oncology

## 2019-07-22 DIAGNOSIS — C61 Malignant neoplasm of prostate: Secondary | ICD-10-CM

## 2019-07-22 DIAGNOSIS — Z85068 Personal history of other malignant neoplasm of small intestine: Secondary | ICD-10-CM

## 2019-07-22 DIAGNOSIS — C179 Malignant neoplasm of small intestine, unspecified: Secondary | ICD-10-CM

## 2019-07-26 ENCOUNTER — Inpatient Hospital Stay: Payer: Self-pay | Attending: Nurse Practitioner | Admitting: Nurse Practitioner

## 2019-07-26 ENCOUNTER — Inpatient Hospital Stay: Payer: Self-pay

## 2019-07-26 ENCOUNTER — Encounter: Payer: Self-pay | Admitting: Nurse Practitioner

## 2019-07-26 ENCOUNTER — Other Ambulatory Visit: Payer: Self-pay

## 2019-07-26 VITALS — BP 141/86 | HR 100 | Temp 98.2°F | Resp 18 | Ht 61.0 in | Wt 161.9 lb

## 2019-07-26 DIAGNOSIS — Z5111 Encounter for antineoplastic chemotherapy: Secondary | ICD-10-CM | POA: Insufficient documentation

## 2019-07-26 DIAGNOSIS — C7951 Secondary malignant neoplasm of bone: Secondary | ICD-10-CM | POA: Insufficient documentation

## 2019-07-26 DIAGNOSIS — C61 Malignant neoplasm of prostate: Secondary | ICD-10-CM

## 2019-07-26 DIAGNOSIS — C179 Malignant neoplasm of small intestine, unspecified: Secondary | ICD-10-CM

## 2019-07-26 LAB — CMP (CANCER CENTER ONLY)
ALT: 17 U/L (ref 0–44)
AST: 11 U/L — ABNORMAL LOW (ref 15–41)
Albumin: 3.2 g/dL — ABNORMAL LOW (ref 3.5–5.0)
Alkaline Phosphatase: 79 U/L (ref 38–126)
Anion gap: 12 (ref 5–15)
BUN: 25 mg/dL — ABNORMAL HIGH (ref 8–23)
CO2: 24 mmol/L (ref 22–32)
Calcium: 9.3 mg/dL (ref 8.9–10.3)
Chloride: 101 mmol/L (ref 98–111)
Creatinine: 0.92 mg/dL (ref 0.61–1.24)
GFR, Est AFR Am: >60 mL/min (ref >60)
GFR, Estimated: >60 mL/min (ref >60)
Glucose, Bld: 270 mg/dL — ABNORMAL HIGH (ref 70–99)
Potassium: 3.5 mmol/L (ref 3.5–5.1)
Sodium: 137 mmol/L (ref 135–145)
Total Bilirubin: 0.4 mg/dL (ref 0.3–1.2)
Total Protein: 6.9 g/dL (ref 6.5–8.1)

## 2019-07-26 LAB — CBC WITH DIFFERENTIAL (CANCER CENTER ONLY)
Abs Immature Granulocytes: 0.06 10*3/uL (ref 0.00–0.07)
Basophils Absolute: 0 10*3/uL (ref 0.0–0.1)
Basophils Relative: 0 %
Eosinophils Absolute: 0.1 10*3/uL (ref 0.0–0.5)
Eosinophils Relative: 1 %
HCT: 32.5 % — ABNORMAL LOW (ref 39.0–52.0)
Hemoglobin: 10.1 g/dL — ABNORMAL LOW (ref 13.0–17.0)
Immature Granulocytes: 1 %
Lymphocytes Relative: 24 %
Lymphs Abs: 2.1 10*3/uL (ref 0.7–4.0)
MCH: 28.2 pg (ref 26.0–34.0)
MCHC: 31.1 g/dL (ref 30.0–36.0)
MCV: 90.8 fL (ref 80.0–100.0)
Monocytes Absolute: 0.6 10*3/uL (ref 0.1–1.0)
Monocytes Relative: 7 %
Neutro Abs: 5.8 10*3/uL (ref 1.7–7.7)
Neutrophils Relative %: 67 %
Platelet Count: 323 10*3/uL (ref 150–400)
RBC: 3.58 MIL/uL — ABNORMAL LOW (ref 4.22–5.81)
RDW: 13.6 % (ref 11.5–15.5)
WBC Count: 8.7 10*3/uL (ref 4.0–10.5)
nRBC: 0 % (ref 0.0–0.2)

## 2019-07-26 MED ORDER — SODIUM CHLORIDE 0.9 % IV SOLN
Freq: Once | INTRAVENOUS | Status: AC
  Administered 2019-07-26: 12:00:00 via INTRAVENOUS
  Filled 2019-07-26: qty 250

## 2019-07-26 MED ORDER — LEUPROLIDE ACETATE (4 MONTH) 30 MG IM KIT
30.0000 mg | PACK | Freq: Once | INTRAMUSCULAR | Status: AC
  Administered 2019-07-26: 30 mg via INTRAMUSCULAR
  Filled 2019-07-26: qty 30

## 2019-07-26 MED ORDER — ZOLEDRONIC ACID 4 MG/5ML IV CONC
4.0000 mg | Freq: Once | INTRAVENOUS | Status: AC
  Administered 2019-07-26: 4 mg via INTRAVENOUS
  Filled 2019-07-26: qty 5

## 2019-07-26 NOTE — Addendum Note (Signed)
Addended by: Owens Shark on: 07/26/2019 10:45 AM   Modules accepted: Orders

## 2019-07-26 NOTE — Patient Instructions (Signed)
Zoledronic Acid injection (Hypercalcemia, Oncology) What is this medicine? ZOLEDRONIC ACID (ZOE le dron ik AS id) lowers the amount of calcium loss from bone. It is used to treat too much calcium in your blood from cancer. It is also used to prevent complications of cancer that has spread to the bone. This medicine may be used for other purposes; ask your health care provider or pharmacist if you have questions. COMMON BRAND NAME(S): Zometa What should I tell my health care provider before I take this medicine? They need to know if you have any of these conditions:  aspirin-sensitive asthma  cancer, especially if you are receiving medicines used to treat cancer  dental disease or wear dentures  infection  kidney disease  receiving corticosteroids like dexamethasone or prednisone  an unusual or allergic reaction to zoledronic acid, other medicines, foods, dyes, or preservatives  pregnant or trying to get pregnant  breast-feeding How should I use this medicine? This medicine is for infusion into a vein. It is given by a health care professional in a hospital or clinic setting. Talk to your pediatrician regarding the use of this medicine in children. Special care may be needed. Overdosage: If you think you have taken too much of this medicine contact a poison control center or emergency room at once. NOTE: This medicine is only for you. Do not share this medicine with others. What if I miss a dose? It is important not to miss your dose. Call your doctor or health care professional if you are unable to keep an appointment. What may interact with this medicine?  certain antibiotics given by injection  NSAIDs, medicines for pain and inflammation, like ibuprofen or naproxen  some diuretics like bumetanide, furosemide  teriparatide  thalidomide This list may not describe all possible interactions. Give your health care provider a list of all the medicines, herbs, non-prescription  drugs, or dietary supplements you use. Also tell them if you smoke, drink alcohol, or use illegal drugs. Some items may interact with your medicine. What should I watch for while using this medicine? Visit your doctor or health care professional for regular checkups. It may be some time before you see the benefit from this medicine. Do not stop taking your medicine unless your doctor tells you to. Your doctor may order blood tests or other tests to see how you are doing. Women should inform their doctor if they wish to become pregnant or think they might be pregnant. There is a potential for serious side effects to an unborn child. Talk to your health care professional or pharmacist for more information. You should make sure that you get enough calcium and vitamin D while you are taking this medicine. Discuss the foods you eat and the vitamins you take with your health care professional. Some people who take this medicine have severe bone, joint, and/or muscle pain. This medicine may also increase your risk for jaw problems or a broken thigh bone. Tell your doctor right away if you have severe pain in your jaw, bones, joints, or muscles. Tell your doctor if you have any pain that does not go away or that gets worse. Tell your dentist and dental surgeon that you are taking this medicine. You should not have major dental surgery while on this medicine. See your dentist to have a dental exam and fix any dental problems before starting this medicine. Take good care of your teeth while on this medicine. Make sure you see your dentist for regular follow-up   appointments. °What side effects may I notice from receiving this medicine? °Side effects that you should report to your doctor or health care professional as soon as possible: °· allergic reactions like skin rash, itching or hives, swelling of the face, lips, or tongue °· anxiety, confusion, or depression °· breathing problems °· changes in vision °· eye  pain °· feeling faint or lightheaded, falls °· jaw pain, especially after dental work °· mouth sores °· muscle cramps, stiffness, or weakness °· redness, blistering, peeling or loosening of the skin, including inside the mouth °· trouble passing urine or change in the amount of urine °Side effects that usually do not require medical attention (report to your doctor or health care professional if they continue or are bothersome): °· bone, joint, or muscle pain °· constipation °· diarrhea °· fever °· hair loss °· irritation at site where injected °· loss of appetite °· nausea, vomiting °· stomach upset °· trouble sleeping °· trouble swallowing °· weak or tired °This list may not describe all possible side effects. Call your doctor for medical advice about side effects. You may report side effects to FDA at 1-800-FDA-1088. °Where should I keep my medicine? °This drug is given in a hospital or clinic and will not be stored at home. °NOTE: This sheet is a summary. It may not cover all possible information. If you have questions about this medicine, talk to your doctor, pharmacist, or health care provider. °© 2020 [employer]/Gold Standard (2014-02-08 14:19:39) ° ° ° °Leuprolide injection °What is this medicine? °LEUPROLIDE (loo PROE lide) is a man-made hormone. It is used to treat the symptoms of prostate cancer. This medicine may also be used to treat children with early onset of puberty. It may be used for other hormonal conditions. °This medicine may be used for other purposes; ask your health care provider or pharmacist if you have questions. °COMMON BRAND NAME(S): Lupron °What should I tell my health care provider before I take this medicine? °They need to know if you have any of these conditions: °· diabetes °· heart disease or previous heart attack °· high blood pressure °· high cholesterol °· pain or difficulty passing urine °· spinal cord metastasis °· stroke °· tobacco smoker °· an unusual or allergic reaction to  leuprolide, benzyl alcohol, other medicines, foods, dyes, or preservatives °· pregnant or trying to get pregnant °· breast-feeding °How should I use this medicine? °This medicine is for injection under the skin or into a muscle. You will be taught how to prepare and give this medicine. Use exactly as directed. Take your medicine at regular intervals. Do not take your medicine more often than directed. °It is important that you put your used needles and syringes in a special sharps container. Do not put them in a trash can. If you do not have a sharps container, call your pharmacist or healthcare provider to get one. °A special MedGuide will be given to you by the pharmacist with each prescription and refill. Be sure to read this information carefully each time. °Talk to your pediatrician regarding the use of this medicine in children. While this medicine may be prescribed for children as young as 8 years for selected conditions, precautions do apply. °Overdosage: If you think you have taken too much of this medicine contact a poison control center or emergency room at once. °NOTE: This medicine is only for you. Do not share this medicine with others. °What if I miss a dose? °If you miss a dose, take   it as soon as you can. If it is almost time for your next dose, take only that dose. Do not take double or extra doses. °What may interact with this medicine? °Do not take this medicine with any of the following medications: °· chasteberry °This medicine may also interact with the following medications: °· herbal or dietary supplements, like black cohosh or DHEA °· male hormones, like estrogens or progestins and birth control pills, patches, rings, or injections °· male hormones, like testosterone °This list may not describe all possible interactions. Give your health care provider a list of all the medicines, herbs, non-prescription drugs, or dietary supplements you use. Also tell them if you smoke, drink alcohol, or  use illegal drugs. Some items may interact with your medicine. °What should I watch for while using this medicine? °Visit your doctor or health care professional for regular checks on your progress. During the first week, your symptoms may get worse, but then will improve as you continue your treatment. You may get hot flashes, increased bone pain, increased difficulty passing urine, or an aggravation of nerve symptoms. Discuss these effects with your doctor or health care professional, some of them may improve with continued use of this medicine. °Male patients may experience a menstrual cycle or spotting during the first 2 months of therapy with this medicine. If this continues, contact your doctor or health care professional. °This medicine may increase blood sugar. Ask your healthcare provider if changes in diet or medicines are needed if you have diabetes. °What side effects may I notice from receiving this medicine? °Side effects that you should report to your doctor or health care professional as soon as possible: °· allergic reactions like skin rash, itching or hives, swelling of the face, lips, or tongue °· breathing problems °· chest pain °· depression or memory disorders °· pain in your legs or groin °· pain at site where injected °· severe headache °· signs and symptoms of high blood sugar such as being more thirsty or hungry or having to urinate more than normal. You may also feel very tired or have blurry vision °· swelling of the feet and legs °· visual changes °· vomiting °Side effects that usually do not require medical attention (report to your doctor or health care professional if they continue or are bothersome): °· breast swelling or tenderness °· decrease in sex drive or performance °· diarrhea °· hot flashes °· loss of appetite °· muscle, joint, or bone pains °· nausea °· redness or irritation at site where injected °· skin problems or acne °This list may not describe all possible side  effects. Call your doctor for medical advice about side effects. You may report side effects to FDA at 1-800-FDA-1088. °Where should I keep my medicine? °Keep out of the reach of children. °Store below 25 degrees C (77 degrees F). Do not freeze. Protect from light. Do not use if it is not clear or if there are particles present. Throw away any unused medicine after the expiration date. °NOTE: This sheet is a summary. It may not cover all possible information. If you have questions about this medicine, talk to your doctor, pharmacist, or health care provider. °© 2020 [employer]vier/Gold Standard (2018-07-12 09:52:48) ° °

## 2019-07-26 NOTE — Progress Notes (Signed)
[place] OFFICE PROGRESS NOTE   Diagnosis: Prostate cancer, small bowel carcinoma  INTERVAL HISTORY:   Brandon Peters returns as scheduled.  He continues [employer] investment [employer].  He feels well.  He has a good appetite.  He denies pain.  No nausea or vomiting.  No diarrhea.  He denies bleeding.  No fever, cough, shortness of breath.  Objective:  Vital signs in last 24 hours:  Blood pressure (!) 141/86, pulse 100, temperature 98.2 F (36.8 C), temperature source Temporal, resp. rate 18, height '5\' 1"'$  (1.549 m), weight 161 lb 14.4 oz (73.4 kg), SpO2 99 %.    HEENT: No thrush or ulcers. Lymphatics: No palpable cervical or supraclavicular lymph nodes. GI: Abdomen soft and nontender.  No hepatomegaly.  No mass. Vascular: Trace edema at the lower legs bilaterally. Neuro: Alert and oriented. Skin: Palms dry appearing, right greater than left.   Lab Results:  Lab Results  Component Value Date   WBC 8.7 07/26/2019   HGB 10.1 (L) 07/26/2019   HCT 32.5 (L) 07/26/2019   MCV 90.8 07/26/2019   PLT 323 07/26/2019   NEUTROABS 5.8 07/26/2019    Imaging:  No results found.  Medications: I have reviewed the patient's current medications.  Assessment/Plan: 1. History of Severe anemia-status post a red cell transfusion 03/04/2016 2.. Prostate cancer  Extensive sclerotic bone metastases noted on a CT of the abdomen/pelvis 03/04/2016 and MRI abdomen 03/05/2016  Markedly elevated PSA  Lupron initiated 03/15/2016; Casodex 14 days beginning 03/15/2016  Prostate biopsy 03/18/2016  05/09/2016 PSA significantly improved at 21  Abirateroneinitiated08/15/2017  PSA less than 0.1 07/10/2018  PSA less than 0.1 08/30/2018  PSA less than 0.1 10/18/2018  PSA less than 0.1 11/29/2018  PSA less than 0.1 on 01/31/2019  3. Symptoms of obstructive uropathy. Improved   4. Distal duodenal and descending colon masses  biopsy of the duodenal mass 03/04/2016 confirmed fragments of small  bowel mucosa with focal high-grade dysplasia  Biopsy of the descending colon "polyps" revealed fragments of a tubulovillous adenoma  CT 08/29/2017-similar soft tissue thickening in the region of the splenic flexure of the colon suboptimally evaluated due to lack of colonic enteric contrast  Referred to Dr. Benson Norway  Referred to [facility]  Resection of descending colon polyps 01/31/2018, tubulovillous adenomas with high-grade dysplasia  Attempted endoscopic resection of the fourth segment duodenal "polyp"on 05/09/2018, firm unresectable lesion, biopsy revealed moderately differentiated adenocarcinoma  CT abdomen/pelvis 05/25/2018-heterogeneously enhancing right lower pole renal lesion; diffuse sclerotic osseous metastatic lesions compatible with history of prostate cancer  CT chest 05/25/2018-no pulmonary metastasis. Diffuse osseous metastasis involving the axial and appendicular skeleton.  06/19/2018-sleeve resection of third and fourth portions of duodenum with primary end-to-end anastomosis, feeding gastrojejunostomy tube placement, Dr. Stark Klein differentiated adenocarcinoma duodenum, 3 cm;tumor invades the muscularis propria; lymphovascular invasion present; negative margins; 4 of 11 involved lymph nodes;soft tissue deposits also seen in the mesentery; all mucosal margins and soft tissue margins negative but 2 of the positive nodes are found in the soft tissue margin submitted for frozen section(pT2 pN2); second mucosal lesion 1.2 cm from the proximal margin, tubular adenoma with focal severe dysplasia  Intact expression of mismatch repair proteins by IHC; microsatellite stable  Cycle 1 adjuvant Xeloda 07/23/2018  Cycle 2 adjuvant Xeloda 08/13/2018  Cycle 3 adjuvant Xeloda 09/10/2018 (dose reduced to 1000 mg twice daily for 14 days)  Cycle 4 adjuvant Xeloda 10/01/2018 (dose increased to 1500 mg every morning and 1000 mg every afternoon for 14 days)  Cycle 5  adjuvant Xeloda 10/22/2018   Cycle 6 adjuvant Xeloda 11/12/2018  Cycle 7 adjuvant Xeloda 12/03/2018  Cycle 8 adjuvant Xeloda 12/24/2018  5. Anorexia/weight loss-resolved  6. Undescended testicles  7. Right renal mass. CT 08/29/2017-right renal mass involving the lower pole was partially calcified and does demonstrate enhancement following contrast most consistent with renal cell carcinoma. Lesion not significantly enlarged compared with the prior CT. Followed by urology.  8. Diabetes   Disposition: Brandon Peters appears stable.  He will continue Zytiga.  We will follow-up on the PSA from today.  He is scheduled to receive Lupron and Zometa today.  We reviewed the CBC from today.  He has stable mild anemia likely related to previous chemotherapy and prostate cancer.  He will return for lab, follow-up and Lupron/Zometa in 3 months.  He will contact the office in the interim with any problems.    Ned Card ANP/GNP-BC   07/26/2019  9:41 AM

## 2019-07-27 LAB — PROSTATE-SPECIFIC AG, SERUM (LABCORP): Prostate Specific Ag, Serum: <0.1 ng/mL (ref 0.0–4.0)

## 2019-08-23 ENCOUNTER — Other Ambulatory Visit: Payer: Self-pay | Admitting: Oncology

## 2019-08-23 DIAGNOSIS — C179 Malignant neoplasm of small intestine, unspecified: Secondary | ICD-10-CM

## 2019-08-23 DIAGNOSIS — Z85068 Personal history of other malignant neoplasm of small intestine: Secondary | ICD-10-CM

## 2019-08-23 DIAGNOSIS — C61 Malignant neoplasm of prostate: Secondary | ICD-10-CM

## 2019-08-24 ENCOUNTER — Other Ambulatory Visit: Payer: Self-pay | Admitting: Oncology

## 2019-08-24 DIAGNOSIS — Z85068 Personal history of other malignant neoplasm of small intestine: Secondary | ICD-10-CM

## 2019-08-24 DIAGNOSIS — C179 Malignant neoplasm of small intestine, unspecified: Secondary | ICD-10-CM

## 2019-08-24 DIAGNOSIS — C61 Malignant neoplasm of prostate: Secondary | ICD-10-CM

## 2019-08-27 ENCOUNTER — Other Ambulatory Visit: Payer: Self-pay | Admitting: Oncology

## 2019-08-29 ENCOUNTER — Other Ambulatory Visit: Payer: Self-pay | Admitting: *Deleted

## 2019-08-29 MED ORDER — PREDNISONE 5 MG PO TABS: 5.0000 mg | ORAL_TABLET | Freq: Every day | ORAL | 1 refills | Status: DC

## 2019-08-29 MED FILL — predniSONE 5 MG TABS: 5 | 90 days supply | Qty: 90 | Fill #0

## 2019-08-29 NOTE — Progress Notes (Signed)
Per Dr. Benay Spice, he should be on 5 mg prednisone daily.

## 2019-09-06 ENCOUNTER — Telehealth: Payer: Self-pay | Admitting: *Deleted

## 2019-09-06 NOTE — Telephone Encounter (Signed)
Concerned about his bill for Lupron on 08/26/2019--he usually is not charged for the Lupron, but the bill this time was $17,000. Inquired if he was getting it through patient assistance program though our pharmacy? He does recall working with Rob in pharmacy on this in the past. Will have pharmacy tech contact him--may need to renew his PAP application.

## 2019-09-09 ENCOUNTER — Encounter: Payer: Self-pay | Admitting: Nurse Practitioner

## 2019-09-10 ENCOUNTER — Telehealth: Payer: Self-pay

## 2019-09-10 NOTE — Telephone Encounter (Signed)
Progress Note:  Received patient visit follow up question in regard to being charged too much for 07/26/19 visit for infusion treatment. Patient has been having trouble getting in contact with the finance department. I forwarded patient concern to Lovina Reach in financial resource support so that she could get back in contact with patient in regard to his concern about his bill.

## 2019-09-22 ENCOUNTER — Other Ambulatory Visit: Payer: Self-pay | Admitting: Oncology

## 2019-09-22 DIAGNOSIS — C61 Malignant neoplasm of prostate: Secondary | ICD-10-CM

## 2019-09-22 DIAGNOSIS — C179 Malignant neoplasm of small intestine, unspecified: Secondary | ICD-10-CM

## 2019-09-22 DIAGNOSIS — Z85068 Personal history of other malignant neoplasm of small intestine: Secondary | ICD-10-CM

## 2019-09-24 ENCOUNTER — Other Ambulatory Visit: Payer: Self-pay | Admitting: Oncology

## 2019-09-24 DIAGNOSIS — C179 Malignant neoplasm of small intestine, unspecified: Secondary | ICD-10-CM

## 2019-09-24 DIAGNOSIS — Z85068 Personal history of other malignant neoplasm of small intestine: Secondary | ICD-10-CM

## 2019-09-24 DIAGNOSIS — C61 Malignant neoplasm of prostate: Secondary | ICD-10-CM

## 2019-10-08 ENCOUNTER — Encounter: Payer: Self-pay | Admitting: Pharmacy Technician

## 2019-10-08 NOTE — Progress Notes (Signed)
The patient has been re-enrolled for drug assistance by [employer] for Lupron 30 mg q 4 mo.s. Enrollment is effective until 09/15/20 and is based on self -pay.  Drug replacement will continue uninterrupted.

## 2019-10-19 ENCOUNTER — Other Ambulatory Visit: Payer: Self-pay | Admitting: Oncology

## 2019-10-19 DIAGNOSIS — C179 Malignant neoplasm of small intestine, unspecified: Secondary | ICD-10-CM

## 2019-10-19 DIAGNOSIS — C61 Malignant neoplasm of prostate: Secondary | ICD-10-CM

## 2019-10-19 DIAGNOSIS — Z85068 Personal history of other malignant neoplasm of small intestine: Secondary | ICD-10-CM

## 2019-10-28 ENCOUNTER — Inpatient Hospital Stay: Payer: Self-pay

## 2019-10-28 ENCOUNTER — Telehealth: Payer: Self-pay | Admitting: *Deleted

## 2019-10-28 ENCOUNTER — Inpatient Hospital Stay: Payer: Self-pay | Admitting: Oncology

## 2019-10-28 NOTE — Telephone Encounter (Signed)
Patient left VM to cancel appointments today and wishes to reschedule. Scheduling message sent.

## 2019-11-01 ENCOUNTER — Telehealth: Payer: Self-pay | Admitting: Oncology

## 2019-11-01 NOTE — Telephone Encounter (Signed)
Scheduled apt per 2/1 sch message - pt aware of appt date and time

## 2019-11-06 ENCOUNTER — Other Ambulatory Visit: Payer: Self-pay

## 2019-11-06 ENCOUNTER — Telehealth: Payer: Self-pay | Admitting: *Deleted

## 2019-11-06 ENCOUNTER — Inpatient Hospital Stay: Payer: Self-pay

## 2019-11-06 ENCOUNTER — Inpatient Hospital Stay: Payer: Self-pay | Attending: Oncology

## 2019-11-06 ENCOUNTER — Inpatient Hospital Stay (HOSPITAL_BASED_OUTPATIENT_CLINIC_OR_DEPARTMENT_OTHER): Payer: Self-pay | Admitting: Oncology

## 2019-11-06 VITALS — BP 149/79 | HR 98 | Temp 97.7°F | Resp 17 | Ht 61.0 in | Wt 163.2 lb

## 2019-11-06 DIAGNOSIS — C61 Malignant neoplasm of prostate: Secondary | ICD-10-CM

## 2019-11-06 DIAGNOSIS — C179 Malignant neoplasm of small intestine, unspecified: Secondary | ICD-10-CM

## 2019-11-06 DIAGNOSIS — C7951 Secondary malignant neoplasm of bone: Secondary | ICD-10-CM | POA: Insufficient documentation

## 2019-11-06 DIAGNOSIS — Z5111 Encounter for antineoplastic chemotherapy: Secondary | ICD-10-CM | POA: Insufficient documentation

## 2019-11-06 LAB — CBC WITH DIFFERENTIAL (CANCER CENTER ONLY)
Abs Immature Granulocytes: 0.03 10*3/uL (ref 0.00–0.07)
Basophils Absolute: 0 10*3/uL (ref 0.0–0.1)
Basophils Relative: 0 %
Eosinophils Absolute: 0.1 10*3/uL (ref 0.0–0.5)
Eosinophils Relative: 2 %
HCT: 29.9 % — ABNORMAL LOW (ref 39.0–52.0)
Hemoglobin: 9.1 g/dL — ABNORMAL LOW (ref 13.0–17.0)
Immature Granulocytes: 0 %
Lymphocytes Relative: 18 %
Lymphs Abs: 1.4 10*3/uL (ref 0.7–4.0)
MCH: 28.2 pg (ref 26.0–34.0)
MCHC: 30.4 g/dL (ref 30.0–36.0)
MCV: 92.6 fL (ref 80.0–100.0)
Monocytes Absolute: 0.6 10*3/uL (ref 0.1–1.0)
Monocytes Relative: 8 %
Neutro Abs: 5.4 10*3/uL (ref 1.7–7.7)
Neutrophils Relative %: 72 %
Platelet Count: 337 10*3/uL (ref 150–400)
RBC: 3.23 MIL/uL — ABNORMAL LOW (ref 4.22–5.81)
RDW: 14 % (ref 11.5–15.5)
WBC Count: 7.5 10*3/uL (ref 4.0–10.5)
nRBC: 0 % (ref 0.0–0.2)

## 2019-11-06 LAB — CMP (CANCER CENTER ONLY)
ALT: 14 U/L (ref 0–44)
AST: 11 U/L — ABNORMAL LOW (ref 15–41)
Albumin: 3.5 g/dL (ref 3.5–5.0)
Alkaline Phosphatase: 75 U/L (ref 38–126)
Anion gap: 10 (ref 5–15)
BUN: 23 mg/dL (ref 8–23)
CO2: 24 mmol/L (ref 22–32)
Calcium: 9.3 mg/dL (ref 8.9–10.3)
Chloride: 104 mmol/L (ref 98–111)
Creatinine: 0.96 mg/dL (ref 0.61–1.24)
GFR, Est AFR Am: >60 mL/min (ref >60)
GFR, Estimated: >60 mL/min (ref >60)
Glucose, Bld: 196 mg/dL — ABNORMAL HIGH (ref 70–99)
Potassium: 3.4 mmol/L — ABNORMAL LOW (ref 3.5–5.1)
Sodium: 138 mmol/L (ref 135–145)
Total Bilirubin: 0.3 mg/dL (ref 0.3–1.2)
Total Protein: 7.1 g/dL (ref 6.5–8.1)

## 2019-11-06 LAB — FERRITIN: Ferritin: 28 ng/mL (ref 24–336)

## 2019-11-06 MED ORDER — SODIUM CHLORIDE 0.9 % IV SOLN
Freq: Once | INTRAVENOUS | Status: AC
  Filled 2019-11-06: qty 250

## 2019-11-06 MED ORDER — FERROUS SULFATE 325 (65 FE) MG PO TBEC: 325.0000 mg | DELAYED_RELEASE_TABLET | Freq: Three times a day (TID) | ORAL | 3 refills | Status: DC

## 2019-11-06 MED ORDER — LEUPROLIDE ACETATE (4 MONTH) 30 MG IM KIT
30.0000 mg | PACK | Freq: Once | INTRAMUSCULAR | Status: AC
  Administered 2019-11-06: 30 mg via INTRAMUSCULAR
  Filled 2019-11-06: qty 30

## 2019-11-06 MED ORDER — ZOLEDRONIC ACID 4 MG/100ML IV SOLN
INTRAVENOUS | Status: AC
  Filled 2019-11-06: qty 100

## 2019-11-06 MED ORDER — ZOLEDRONIC ACID 4 MG/100ML IV SOLN
4.0000 mg | Freq: Once | INTRAVENOUS | Status: AC
  Administered 2019-11-06: 11:00:00 4 mg via INTRAVENOUS

## 2019-11-06 NOTE — Progress Notes (Signed)
Soon after zoledronic acid infusion began, patient began c/o "bad pain" at IV site. Infusion stopped immediately. On visual assessment, noted area at IV site and proximal to be edematous. States area is tender to palpation. No blood return from IV. Unable to aspirate medication from line. Spoke with Demetrius Charity regarding extravasation. No specific treatment indicated.  Ice pack applied to affected area to improve comfort . 2nd IV site obtained and medication resumed and completed without further incident.  Instructed patient to continue using ice to affected area as needed 4 times daily x 24-48 hours a. Patient verbalized understanding.

## 2019-11-06 NOTE — Telephone Encounter (Signed)
Made him aware of ferritin and to begin feso4 325 mg tid and that he may require stool softener. Informed him that nurse also sent him a MyChart message with specific information. He will sign on and review.

## 2019-11-06 NOTE — Telephone Encounter (Signed)
-----   Message from Ladell Pier, MD sent at 11/06/2019 12:37 PM EST ----- Please call patient, iron is low, start ferrous sulfate 325 mg 3 times daily, use stool softener as needed for constipation

## 2019-11-06 NOTE — Progress Notes (Signed)
[place] OFFICE PROGRESS NOTE   Diagnosis: Prostate cancer  INTERVAL HISTORY:   Brandon Peters returns for a scheduled visit.  He continues abiraterone.  He feels well.  No difficulty with bowel or bladder function.  Good appetite.  No bleeding.  He has not been seen at the urology clinic to follow-up on the renal mass.  Objective:  Vital signs in last 24 hours:  Blood pressure (!) 149/79, pulse 98, temperature 97.7 F (36.5 C), temperature source Temporal, resp. rate 17, height '5\' 1"'  (1.549 m), weight 163 lb 3.2 oz (74 kg), SpO2 98 %.    Limited physical examination secondary to distancing with the Covid pandemic Lymphatics: No cervical, supraclavicular, axillary, or inguinal nodes GI: Nontender, no mass, no hepatosplenomegaly Vascular: Trace pitting edema at the left greater than right lower leg   Lab Results:  Lab Results  Component Value Date   WBC 7.5 11/06/2019   HGB 9.1 (L) 11/06/2019   HCT 29.9 (L) 11/06/2019   MCV 92.6 11/06/2019   PLT 337 11/06/2019   NEUTROABS 5.4 11/06/2019    CMP  Lab Results  Component Value Date   NA 137 07/26/2019   K 3.5 07/26/2019   CL 101 07/26/2019   CO2 24 07/26/2019   GLUCOSE 270 (H) 07/26/2019   BUN 25 (H) 07/26/2019   CREATININE 0.92 07/26/2019   CALCIUM 9.3 07/26/2019   PROT 6.9 07/26/2019   ALBUMIN 3.2 (L) 07/26/2019   AST 11 (L) 07/26/2019   ALT 17 07/26/2019   ALKPHOS 79 07/26/2019   BILITOT 0.4 07/26/2019   GFRNONAA >60 07/26/2019   GFRAA >60 07/26/2019    Lab Results  Component Value Date   CEA1 3.31 05/03/2019    No results found for: INR  Imaging:  No results found.  Medications: I have reviewed the patient's current medications.   Assessment/Plan: 1. History of Severe anemia-status post a red cell transfusion 03/04/2016 2.. Prostate cancer  Extensive sclerotic bone metastases noted on a CT of the abdomen/pelvis 03/04/2016 and MRI abdomen 03/05/2016  Markedly elevated  PSA  Lupron initiated 03/15/2016; Casodex 14 days beginning 03/15/2016  Prostate biopsy 03/18/2016  05/09/2016 PSA significantly improved at 21  Abirateroneinitiated08/15/2017  PSA less than 0.1 07/10/2018  PSA less than 0.1 08/30/2018  PSA less than 0.1 10/18/2018  PSA less than 0.1 11/29/2018  PSA less than 0.1 on 01/31/2019  3. Symptoms of obstructive uropathy. Improved   4. Distal duodenal and descending colon masses  biopsy of the duodenal mass 03/04/2016 confirmed fragments of small bowel mucosa with focal high-grade dysplasia  Biopsy of the descending colon "polyps" revealed fragments of a tubulovillous adenoma  CT 08/29/2017-similar soft tissue thickening in the region of the splenic flexure of the colon suboptimally evaluated due to lack of colonic enteric contrast  Referred to Dr. Benson Norway  Referred to [facility]  Resection of descending colon polyps 01/31/2018, tubulovillous adenomas with high-grade dysplasia  Attempted endoscopic resection of the fourth segment duodenal "polyp"on 05/09/2018, firm unresectable lesion, biopsy revealed moderately differentiated adenocarcinoma  CT abdomen/pelvis 05/25/2018-heterogeneously enhancing right lower pole renal lesion; diffuse sclerotic osseous metastatic lesions compatible with history of prostate cancer  CT chest 05/25/2018-no pulmonary metastasis. Diffuse osseous metastasis involving the axial and appendicular skeleton.  06/19/2018-sleeve resection of third and fourth portions of duodenum with primary end-to-end anastomosis, feeding gastrojejunostomy tube placement, Dr. Stark Klein differentiated adenocarcinoma duodenum, 3 cm;tumor invades the muscularis propria; lymphovascular invasion present; negative margins; 4 of 11 involved lymph nodes;soft tissue deposits also  seen in the mesentery; all mucosal margins and soft tissue margins negative but 2 of the positive nodes are found in the soft tissue margin submitted for frozen  section(pT2 pN2); second mucosal lesion 1.2 cm from the proximal margin, tubular adenoma with focal severe dysplasia  Intact expression of mismatch repair proteins by IHC; microsatellite stable  Cycle 1 adjuvant Xeloda 07/23/2018  Cycle 2 adjuvant Xeloda 08/13/2018  Cycle 3 adjuvant Xeloda 09/10/2018 (dose reduced to 1000 mg twice daily for 14 days)  Cycle 4 adjuvant Xeloda 10/01/2018 (dose increased to 1500 mg every morning and 1000 mg every afternoon for 14 days)  Cycle 5 adjuvant Xeloda 10/22/2018  Cycle 6 adjuvant Xeloda 11/12/2018  Cycle 7 adjuvant Xeloda 12/03/2018  Cycle 8 adjuvant Xeloda 12/24/2018  5. Anorexia/weight loss-resolved  6. Undescended testicles  7. Right renal mass. CT 08/29/2017-right renal mass involving the lower pole was partially calcified and does demonstrate enhancement following contrast most consistent with renal cell carcinoma. Lesion not significantly enlarged compared with the prior CT. Followed by urology.  8. Diabetes    Disposition: Brandon Peters appears well.  He remains in clinical remission from prostate cancer.  We will follow up on the PSA from today.  He will continue every 54-monthZometa and Lupron.  He has persistent anemia.  The anemia may be related to prostate cancer.  He denies bleeding.  We will check a ferritin level today.  We will repeat a CBC in 1 month.  He is no longer followed at the urology clinic for the right renal mass.  He will be referred for a restaging CT prior to an office visit in 1 month.    GBetsy Coder MD  11/06/2019  10:04 AM

## 2019-11-06 NOTE — Progress Notes (Signed)
Clarified Lupron/Zometa plan with Dr. Benay Spice. Continue Lupron 30mg  Q4 months (every 16 weeks). This dose was changed to due manufacturer availability. Okay to give a little early this appointment. Zometa will continue but at frequency of every 4 months to coincide with Lupron injections. Manuela Schwartz will update schedule.   Demetrius Charity, PharmD, [facility] Pharmacist Pharmacy Phone: [phone] 11/06/2019

## 2019-11-06 NOTE — Patient Instructions (Signed)
Leuprolide injection What is this medicine? LEUPROLIDE (loo PROE lide) is a man-made hormone. It is used to treat the symptoms of prostate cancer. This medicine may also be used to treat children with early onset of puberty. It may be used for other hormonal conditions. This medicine may be used for other purposes; ask your health care provider or pharmacist if you have questions. COMMON BRAND NAME(S): Lupron What should I tell my health care provider before I take this medicine? They need to know if you have any of these conditions:  diabetes  heart disease or previous heart attack  high blood pressure  high cholesterol  pain or difficulty passing urine  spinal cord metastasis  stroke  tobacco smoker  an unusual or allergic reaction to leuprolide, benzyl alcohol, other medicines, foods, dyes, or preservatives  pregnant or trying to get pregnant  breast-feeding How should I use this medicine? This medicine is for injection under the skin or into a muscle. You will be taught how to prepare and give this medicine. Use exactly as directed. Take your medicine at regular intervals. Do not take your medicine more often than directed. It is important that you put your used needles and syringes in a special sharps container. Do not put them in a trash can. If you do not have a sharps container, call your pharmacist or healthcare provider to get one. A special MedGuide will be given to you by the pharmacist with each prescription and refill. Be sure to read this information carefully each time. Talk to your pediatrician regarding the use of this medicine in children. While this medicine may be prescribed for children as young as 8 years for selected conditions, precautions do apply. Overdosage: If you think you have taken too much of this medicine contact a poison control center or emergency room at once. NOTE: This medicine is only for you. Do not share this medicine with others. What if  I miss a dose? If you miss a dose, take it as soon as you can. If it is almost time for your next dose, take only that dose. Do not take double or extra doses. What may interact with this medicine? Do not take this medicine with any of the following medications:  chasteberry This medicine may also interact with the following medications:  herbal or dietary supplements, like black cohosh or DHEA  male hormones, like estrogens or progestins and birth control pills, patches, rings, or injections  male hormones, like testosterone This list may not describe all possible interactions. Give your health care provider a list of all the medicines, herbs, non-prescription drugs, or dietary supplements you use. Also tell them if you smoke, drink alcohol, or use illegal drugs. Some items may interact with your medicine. What should I watch for while using this medicine? Visit your doctor or health care professional for regular checks on your progress. During the first week, your symptoms may get worse, but then will improve as you continue your treatment. You may get hot flashes, increased bone pain, increased difficulty passing urine, or an aggravation of nerve symptoms. Discuss these effects with your doctor or health care professional, some of them may improve with continued use of this medicine. Male patients may experience a menstrual cycle or spotting during the first 2 months of therapy with this medicine. If this continues, contact your doctor or health care professional. This medicine may increase blood sugar. Ask your healthcare provider if changes in diet or medicines are needed if   you have diabetes. What side effects may I notice from receiving this medicine? Side effects that you should report to your doctor or health care professional as soon as possible:  allergic reactions like skin rash, itching or hives, swelling of the face, lips, or tongue  breathing problems  chest  pain  depression or memory disorders  pain in your legs or groin  pain at site where injected  severe headache  signs and symptoms of high blood sugar such as being more thirsty or hungry or having to urinate more than normal. You may also feel very tired or have blurry vision  swelling of the feet and legs  visual changes  vomiting Side effects that usually do not require medical attention (report to your doctor or health care professional if they continue or are bothersome):  breast swelling or tenderness  decrease in sex drive or performance  diarrhea  hot flashes  loss of appetite  muscle, joint, or bone pains  nausea  redness or irritation at site where injected  skin problems or acne This list may not describe all possible side effects. Call your doctor for medical advice about side effects. You may report side effects to FDA at 1-800-FDA-1088. Where should I keep my medicine? Keep out of the reach of children. Store below 25 degrees C (77 degrees F). Do not freeze. Protect from light. Do not use if it is not clear or if there are particles present. Throw away any unused medicine after the expiration date. NOTE: This sheet is a summary. It may not cover all possible information. If you have questions about this medicine, talk to your doctor, pharmacist, or health care provider.  2020 Elsevier/Gold Standard (2018-07-12 09:52:48) Zoledronic Acid injection (Hypercalcemia, Oncology) What is this medicine? ZOLEDRONIC ACID (ZOE le dron ik AS id) lowers the amount of calcium loss from bone. It is used to treat too much calcium in your blood from cancer. It is also used to prevent complications of cancer that has spread to the bone. This medicine may be used for other purposes; ask your health care provider or pharmacist if you have questions. COMMON BRAND NAME(S): Zometa What should I tell my health care provider before I take this medicine? They need to know if you  have any of these conditions:  aspirin-sensitive asthma  cancer, especially if you are receiving medicines used to treat cancer  dental disease or wear dentures  infection  kidney disease  receiving corticosteroids like dexamethasone or prednisone  an unusual or allergic reaction to zoledronic acid, other medicines, foods, dyes, or preservatives  pregnant or trying to get pregnant  breast-feeding How should I use this medicine? This medicine is for infusion into a vein. It is given by a health care professional in a hospital or clinic setting. Talk to your pediatrician regarding the use of this medicine in children. Special care may be needed. Overdosage: If you think you have taken too much of this medicine contact a poison control center or emergency room at once. NOTE: This medicine is only for you. Do not share this medicine with others. What if I miss a dose? It is important not to miss your dose. Call your doctor or health care professional if you are unable to keep an appointment. What may interact with this medicine?  certain antibiotics given by injection  NSAIDs, medicines for pain and inflammation, like ibuprofen or naproxen  some diuretics like bumetanide, furosemide  teriparatide  thalidomide This list may not   describe all possible interactions. Give your health care provider a list of all the medicines, herbs, non-prescription drugs, or dietary supplements you use. Also tell them if you smoke, drink alcohol, or use illegal drugs. Some items may interact with your medicine. What should I watch for while using this medicine? Visit your doctor or health care professional for regular checkups. It may be some time before you see the benefit from this medicine. Do not stop taking your medicine unless your doctor tells you to. Your doctor may order blood tests or other tests to see how you are doing. Women should inform their doctor if they wish to become pregnant or  think they might be pregnant. There is a potential for serious side effects to an unborn child. Talk to your health care professional or pharmacist for more information. You should make sure that you get enough calcium and vitamin D while you are taking this medicine. Discuss the foods you eat and the vitamins you take with your health care professional. Some people who take this medicine have severe bone, joint, and/or muscle pain. This medicine may also increase your risk for jaw problems or a broken thigh bone. Tell your doctor right away if you have severe pain in your jaw, bones, joints, or muscles. Tell your doctor if you have any pain that does not go away or that gets worse. Tell your dentist and dental surgeon that you are taking this medicine. You should not have major dental surgery while on this medicine. See your dentist to have a dental exam and fix any dental problems before starting this medicine. Take good care of your teeth while on this medicine. Make sure you see your dentist for regular follow-up appointments. What side effects may I notice from receiving this medicine? Side effects that you should report to your doctor or health care professional as soon as possible:  allergic reactions like skin rash, itching or hives, swelling of the face, lips, or tongue  anxiety, confusion, or depression  breathing problems  changes in vision  eye pain  feeling faint or lightheaded, falls  jaw pain, especially after dental work  mouth sores  muscle cramps, stiffness, or weakness  redness, blistering, peeling or loosening of the skin, including inside the mouth  trouble passing urine or change in the amount of urine Side effects that usually do not require medical attention (report to your doctor or health care professional if they continue or are bothersome):  bone, joint, or muscle pain  constipation  diarrhea  fever  hair loss  irritation at site where  injected  loss of appetite  nausea, vomiting  stomach upset  trouble sleeping  trouble swallowing  weak or tired This list may not describe all possible side effects. Call your doctor for medical advice about side effects. You may report side effects to FDA at 1-800-FDA-1088. Where should I keep my medicine? This drug is given in a hospital or clinic and will not be stored at home. NOTE: This sheet is a summary. It may not cover all possible information. If you have questions about this medicine, talk to your doctor, pharmacist, or health care provider.  2020 Elsevier/Gold Standard (2014-02-08 14:19:39)  

## 2019-11-07 LAB — PROSTATE-SPECIFIC AG, SERUM (LABCORP): Prostate Specific Ag, Serum: <0.1 ng/mL (ref 0.0–4.0)

## 2019-11-20 ENCOUNTER — Other Ambulatory Visit: Payer: Self-pay | Admitting: Oncology

## 2019-11-20 DIAGNOSIS — C179 Malignant neoplasm of small intestine, unspecified: Secondary | ICD-10-CM

## 2019-11-20 DIAGNOSIS — C61 Malignant neoplasm of prostate: Secondary | ICD-10-CM

## 2019-11-20 DIAGNOSIS — Z85068 Personal history of other malignant neoplasm of small intestine: Secondary | ICD-10-CM

## 2019-11-27 ENCOUNTER — Telehealth: Payer: Self-pay | Admitting: Oncology

## 2019-11-27 NOTE — Telephone Encounter (Signed)
Called and spoke with patient. Confirmed 3/11 and 6/10 appt

## 2019-12-01 MED FILL — predniSONE 5 MG TABS: 5 | 90 days supply | Qty: 90 | Fill #1

## 2019-12-02 ENCOUNTER — Encounter (HOSPITAL_COMMUNITY): Payer: Self-pay

## 2019-12-02 ENCOUNTER — Other Ambulatory Visit: Payer: Self-pay

## 2019-12-02 ENCOUNTER — Ambulatory Visit (HOSPITAL_COMMUNITY)
Admission: RE | Admit: 2019-12-02 | Discharge: 2019-12-02 | Disposition: A | Discharge status: Home / Self Care | Payer: Self-pay | Source: Ambulatory Visit | Attending: Oncology | Admitting: Oncology

## 2019-12-02 DIAGNOSIS — C61 Malignant neoplasm of prostate: Secondary | ICD-10-CM | POA: Insufficient documentation

## 2019-12-02 MED ORDER — IOHEXOL 300 MG/ML  SOLN
100.0000 mL | Freq: Once | INTRAMUSCULAR | Status: AC | PRN
  Administered 2019-12-02: 100 mL via INTRAVENOUS

## 2019-12-02 MED ORDER — SODIUM CHLORIDE (PF) 0.9 % IJ SOLN
INTRAMUSCULAR | Status: AC
  Filled 2019-12-02: qty 50

## 2019-12-05 ENCOUNTER — Inpatient Hospital Stay: Payer: Self-pay | Attending: Oncology | Admitting: Nurse Practitioner

## 2019-12-05 ENCOUNTER — Inpatient Hospital Stay: Payer: Self-pay

## 2019-12-05 ENCOUNTER — Encounter: Payer: Self-pay | Admitting: Nurse Practitioner

## 2019-12-05 ENCOUNTER — Other Ambulatory Visit: Payer: Self-pay

## 2019-12-05 VITALS — BP 152/80 | HR 105 | Temp 98.3°F | Resp 17 | Ht 61.0 in | Wt 162.4 lb

## 2019-12-05 DIAGNOSIS — C7951 Secondary malignant neoplasm of bone: Secondary | ICD-10-CM | POA: Insufficient documentation

## 2019-12-05 DIAGNOSIS — C61 Malignant neoplasm of prostate: Secondary | ICD-10-CM

## 2019-12-05 DIAGNOSIS — D649 Anemia, unspecified: Secondary | ICD-10-CM | POA: Insufficient documentation

## 2019-12-05 DIAGNOSIS — N2889 Other specified disorders of kidney and ureter: Secondary | ICD-10-CM | POA: Insufficient documentation

## 2019-12-05 DIAGNOSIS — C179 Malignant neoplasm of small intestine, unspecified: Secondary | ICD-10-CM

## 2019-12-05 DIAGNOSIS — C7800 Secondary malignant neoplasm of unspecified lung: Secondary | ICD-10-CM | POA: Insufficient documentation

## 2019-12-05 DIAGNOSIS — E119 Type 2 diabetes mellitus without complications: Secondary | ICD-10-CM | POA: Insufficient documentation

## 2019-12-05 LAB — CBC WITH DIFFERENTIAL (CANCER CENTER ONLY)
Abs Immature Granulocytes: 0.04 10*3/uL (ref 0.00–0.07)
Basophils Absolute: 0 10*3/uL (ref 0.0–0.1)
Basophils Relative: 0 %
Eosinophils Absolute: 0 10*3/uL (ref 0.0–0.5)
Eosinophils Relative: 0 %
HCT: 30.3 % — ABNORMAL LOW (ref 39.0–52.0)
Hemoglobin: 9.1 g/dL — ABNORMAL LOW (ref 13.0–17.0)
Immature Granulocytes: 1 %
Lymphocytes Relative: 10 %
Lymphs Abs: 0.8 10*3/uL (ref 0.7–4.0)
MCH: 28.3 pg (ref 26.0–34.0)
MCHC: 30 g/dL (ref 30.0–36.0)
MCV: 94.1 fL (ref 80.0–100.0)
Monocytes Absolute: 0.4 10*3/uL (ref 0.1–1.0)
Monocytes Relative: 5 %
Neutro Abs: 6.3 10*3/uL (ref 1.7–7.7)
Neutrophils Relative %: 84 %
Platelet Count: 315 10*3/uL (ref 150–400)
RBC: 3.22 MIL/uL — ABNORMAL LOW (ref 4.22–5.81)
RDW: 14.3 % (ref 11.5–15.5)
WBC Count: 7.6 10*3/uL (ref 4.0–10.5)
nRBC: 0 % (ref 0.0–0.2)

## 2019-12-05 LAB — RETIC PANEL
Immature Retic Fract: 14.6 % (ref 2.3–15.9)
RBC.: 3.2 MIL/uL — ABNORMAL LOW (ref 4.22–5.81)
Retic Count, Absolute: 78.1 10*3/uL (ref 19.0–186.0)
Retic Ct Pct: 2.4 % (ref 0.4–3.1)
Reticulocyte Hemoglobin: 32.8 pg (ref >27.9)

## 2019-12-05 LAB — SAVE SMEAR(SSMR), FOR PROVIDER SLIDE REVIEW

## 2019-12-05 NOTE — Progress Notes (Addendum)
[place] OFFICE PROGRESS NOTE   Diagnosis: Prostate cancer  INTERVAL HISTORY:   Brandon Peters returns as scheduled.  He continues abiraterone.  He denies pain.  No nausea or vomiting.  He notes stools are dark since beginning oral iron.  He denies any bleeding.  He takes a stool softener and is having regular bowel movements.  He reports a good appetite.  Objective:  Vital signs in last 24 hours:  Blood pressure (!) 152/80, pulse (!) 105, temperature 98.3 F (36.8 C), temperature source Temporal, resp. rate 17, height '5\' 1"'  (1.549 m), weight 162 lb 6.4 oz (73.7 kg), SpO2 100 %.    Resp: Lungs clear bilaterally. Cardio: Regular rate and rhythm. GI: Abdomen soft and nontender.  No hepatomegaly.  No mass. Vascular: Trace edema at the lower legs bilaterally.   Lab Results:  Lab Results  Component Value Date   WBC 7.6 12/05/2019   HGB 9.1 (L) 12/05/2019   HCT 30.3 (L) 12/05/2019   MCV 94.1 12/05/2019   PLT 315 12/05/2019   NEUTROABS 6.3 12/05/2019    Imaging:  No results found.  Medications: I have reviewed the patient's current medications.  Assessment/Plan: 1. History of Severe anemia-status post a red cell transfusion 03/04/2016 2.. Prostate cancer  Extensive sclerotic bone metastases noted on a CT of the abdomen/pelvis 03/04/2016 and MRI abdomen 03/05/2016  Markedly elevated PSA  Lupron initiated 03/15/2016; Casodex 14 days beginning 03/15/2016  Prostate biopsy 03/18/2016  05/09/2016 PSA significantly improved at 21  Abirateroneinitiated08/15/2017  PSA less than 0.1 07/10/2018  PSA less than 0.1 08/30/2018  PSA less than 0.1 10/18/2018  PSA less than 0.1 11/29/2018  PSA less than 0.1 on 01/31/2019  3. Symptoms of obstructive uropathy. Improved   4. Distal duodenal and descending colon masses  biopsy of the duodenal mass 03/04/2016 confirmed fragments of small bowel mucosa with focal high-grade dysplasia  Biopsy of the  descending colon "polyps" revealed fragments of a tubulovillous adenoma  CT 08/29/2017-similar soft tissue thickening in the region of the splenic flexure of the colon suboptimally evaluated due to lack of colonic enteric contrast  Referred to Brandon Peters  Referred to Brandon Peters  Resection of descending colon polyps 01/31/2018, tubulovillous adenomas with high-grade dysplasia  Attempted endoscopic resection of the fourth segment duodenal "polyp"on 05/09/2018, firm unresectable lesion, biopsy revealed moderately differentiated adenocarcinoma  CT abdomen/pelvis 05/25/2018-heterogeneously enhancing right lower pole renal lesion; diffuse sclerotic osseous metastatic lesions compatible with history of prostate cancer  CT chest 05/25/2018-no pulmonary metastasis. Diffuse osseous metastasis involving the axial and appendicular skeleton.  06/19/2018-sleeve resection of third and fourth portions of duodenum with primary end-to-end anastomosis, feeding gastrojejunostomy tube placement, Brandon Peters differentiated adenocarcinoma duodenum, 3 cm;tumor invades the muscularis propria; lymphovascular invasion present; negative margins; 4 of 11 involved lymph nodes;soft tissue deposits also seen in the mesentery; all mucosal margins and soft tissue margins negative but 2 of the positive nodes are found in the soft tissue margin submitted for frozen section(pT2 pN2); second mucosal lesion 1.2 cm from the proximal margin, tubular adenoma with focal severe dysplasia  Intact expression of mismatch repair proteins by IHC; microsatellite stable  Cycle 1 adjuvant Xeloda 07/23/2018  Cycle 2 adjuvant Xeloda 08/13/2018  Cycle 3 adjuvant Xeloda 09/10/2018 (dose reduced to 1000 mg twice daily for 14 days)  Cycle 4 adjuvant Xeloda 10/01/2018 (dose increased to 1500 mg every morning and 1000 mg every afternoon for 14 days)  Cycle 5 adjuvant Xeloda 10/22/2018  Cycle 6 adjuvant Xeloda 11/12/2018  Cycle 7 adjuvant Xeloda  12/03/2018  Cycle 8 adjuvant Xeloda 12/24/2018  5. Anorexia/weight loss-resolved  6. Undescended testicles  7. Right renal mass. CT 08/29/2017-right renal mass involving the lower pole was partially calcified and does demonstrate enhancement following contrast most consistent with renal cell carcinoma. Lesion not significantly enlarged compared with the prior CT. Followed by urology.  8. Diabetes    Disposition: Brandon Peters appears stable.  There is no clinical evidence of progression of the prostate cancer. Most recent PSA was less than 0.1.  He continues abiraterone.  He will continue every 66-monthLupron and Zometa, next due in June.  He has persistent stable anemia.  He appears asymptomatic.  The anemia may be related to prostate cancer.  Ferritin was at the low end of the normal range 11/06/2019.  He will complete a set of stool cards.  He will continue oral iron.  Signs/symptoms suggestive of progressive anemia reviewed at today's visit.  He understands to contact the office should he develop any of these.  We reviewed the CT scan report from 12/02/2019.  The right kidney mass is unchanged.  Plan for continued observation.  He will return for lab, follow-up, Zometa and Lupron in 3 months.  He will contact the office in the interim as outlined above or with any other problems.  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  The restaging CT reveals no evidence of progressive prostate cancer or small bowel carcinoma.  He has persistent anemia.  The anemia is likely secondary to prostate cancer involving the bone marrow.  We will check stool Hemoccult cards.   Brandon Peters

## 2019-12-13 ENCOUNTER — Other Ambulatory Visit: Payer: Self-pay | Admitting: *Deleted

## 2019-12-13 DIAGNOSIS — C179 Malignant neoplasm of small intestine, unspecified: Secondary | ICD-10-CM

## 2019-12-13 LAB — OCCULT BLOOD X 1 CARD TO LAB, STOOL
Fecal Occult Bld: POSITIVE — AB
Fecal Occult Bld: POSITIVE — AB
Fecal Occult Bld: POSITIVE — AB

## 2019-12-17 ENCOUNTER — Other Ambulatory Visit: Payer: Self-pay | Admitting: Nurse Practitioner

## 2019-12-17 ENCOUNTER — Telehealth: Payer: Self-pay

## 2019-12-17 ENCOUNTER — Other Ambulatory Visit: Payer: Self-pay | Admitting: Oncology

## 2019-12-17 DIAGNOSIS — C179 Malignant neoplasm of small intestine, unspecified: Secondary | ICD-10-CM

## 2019-12-17 DIAGNOSIS — Z85068 Personal history of other malignant neoplasm of small intestine: Secondary | ICD-10-CM

## 2019-12-17 DIAGNOSIS — C61 Malignant neoplasm of prostate: Secondary | ICD-10-CM

## 2019-12-17 NOTE — Telephone Encounter (Signed)
Spoke with patient as requested by Ned Card, NP. Pt verbalizes understanding

## 2019-12-17 NOTE — Telephone Encounter (Signed)
-----   Message from Owens Shark, NP sent at 12/17/2019  9:01 AM EDT ----- Please let him know the stool cards he submitted returned positive for blood.  We need to make a referral to his gastroenterologist.  Please find out who this is.  Thanks

## 2019-12-17 NOTE — Telephone Encounter (Signed)
-----   Message from Owens Shark, NP sent at 12/17/2019 11:46 AM EDT ----- Please let him know I made a referral to Dr. Benson Norway. ----- Message ----- From: Veverly Fells, RN Sent: 12/17/2019  11:02 AM EDT To: Owens Shark, NP  His gastroenterologist is [facility]. Benson Norway, MD. He said it's "been a long time" since he has been seen there.  ----- Message ----- From: Owens Shark, NP Sent: 12/17/2019   9:01 AM EDT To: [facility]  Please let him know the stool cards he submitted returned positive for blood.  We need to make a referral to his gastroenterologist.  Please find out who this is.  Thanks

## 2019-12-17 NOTE — Telephone Encounter (Signed)
Spoke with patient informing him of referral to [facility]. Pt verbalizes understanding.

## 2019-12-18 ENCOUNTER — Telehealth: Payer: Self-pay | Admitting: *Deleted

## 2019-12-18 NOTE — Telephone Encounter (Signed)
Referral faxed to Dr Benson Norway

## 2019-12-28 ENCOUNTER — Other Ambulatory Visit: Payer: Self-pay | Admitting: Oncology

## 2019-12-28 DIAGNOSIS — C61 Malignant neoplasm of prostate: Secondary | ICD-10-CM

## 2020-01-19 ENCOUNTER — Other Ambulatory Visit: Payer: Self-pay | Admitting: Oncology

## 2020-01-19 DIAGNOSIS — Z85068 Personal history of other malignant neoplasm of small intestine: Secondary | ICD-10-CM

## 2020-01-19 DIAGNOSIS — C179 Malignant neoplasm of small intestine, unspecified: Secondary | ICD-10-CM

## 2020-01-19 DIAGNOSIS — C61 Malignant neoplasm of prostate: Secondary | ICD-10-CM

## 2020-02-19 ENCOUNTER — Other Ambulatory Visit: Payer: Self-pay | Admitting: Oncology

## 2020-02-19 DIAGNOSIS — Z85068 Personal history of other malignant neoplasm of small intestine: Secondary | ICD-10-CM

## 2020-02-19 DIAGNOSIS — C61 Malignant neoplasm of prostate: Secondary | ICD-10-CM

## 2020-02-19 DIAGNOSIS — C179 Malignant neoplasm of small intestine, unspecified: Secondary | ICD-10-CM

## 2020-02-20 ENCOUNTER — Telehealth: Payer: Self-pay

## 2020-02-20 NOTE — Telephone Encounter (Signed)
Oral Oncology Patient Advocate Encounter  Met patient in lobby to complete application for Wynetta Emery and [place] in an effort to reduce patient's out of pocket expense for Zytiga to $0.    Application completed and faxed to [phone].   [employer] patient assistance phone number for follow up is [phone].   This encounter will be updated until final determination.  [place] Patient [place] Phone [phone] Fax [phone] 02/20/2020 4:12 PM

## 2020-02-27 NOTE — Telephone Encounter (Signed)
Patient is approved for Zytiga at no charge from [employer] 02/27/20-02/26/21 Phone number for follow up [place] Patient [place] Phone [phone] Fax [phone] 02/27/2020 3:27 PM

## 2020-03-03 ENCOUNTER — Other Ambulatory Visit: Payer: Self-pay | Admitting: Oncology

## 2020-03-05 ENCOUNTER — Inpatient Hospital Stay: Payer: Self-pay

## 2020-03-05 ENCOUNTER — Other Ambulatory Visit: Payer: Self-pay

## 2020-03-05 ENCOUNTER — Inpatient Hospital Stay: Payer: Self-pay | Attending: Oncology | Admitting: Oncology

## 2020-03-05 VITALS — BP 146/80 | HR 92 | Temp 97.7°F | Resp 18 | Ht 61.0 in | Wt 157.5 lb

## 2020-03-05 DIAGNOSIS — C61 Malignant neoplasm of prostate: Secondary | ICD-10-CM

## 2020-03-05 DIAGNOSIS — C179 Malignant neoplasm of small intestine, unspecified: Secondary | ICD-10-CM

## 2020-03-05 DIAGNOSIS — Z5111 Encounter for antineoplastic chemotherapy: Secondary | ICD-10-CM | POA: Insufficient documentation

## 2020-03-05 DIAGNOSIS — C7951 Secondary malignant neoplasm of bone: Secondary | ICD-10-CM | POA: Insufficient documentation

## 2020-03-05 LAB — CBC WITH DIFFERENTIAL (CANCER CENTER ONLY)
Abs Immature Granulocytes: 0.03 10*3/uL (ref 0.00–0.07)
Basophils Absolute: 0 10*3/uL (ref 0.0–0.1)
Basophils Relative: 0 %
Eosinophils Absolute: 0.2 10*3/uL (ref 0.0–0.5)
Eosinophils Relative: 3 %
HCT: 30.1 % — ABNORMAL LOW (ref 39.0–52.0)
Hemoglobin: 9.3 g/dL — ABNORMAL LOW (ref 13.0–17.0)
Immature Granulocytes: 0 %
Lymphocytes Relative: 25 %
Lymphs Abs: 1.7 10*3/uL (ref 0.7–4.0)
MCH: 28.8 pg (ref 26.0–34.0)
MCHC: 30.9 g/dL (ref 30.0–36.0)
MCV: 93.2 fL (ref 80.0–100.0)
Monocytes Absolute: 0.6 10*3/uL (ref 0.1–1.0)
Monocytes Relative: 9 %
Neutro Abs: 4.2 10*3/uL (ref 1.7–7.7)
Neutrophils Relative %: 63 %
Platelet Count: 327 10*3/uL (ref 150–400)
RBC: 3.23 MIL/uL — ABNORMAL LOW (ref 4.22–5.81)
RDW: 13.8 % (ref 11.5–15.5)
WBC Count: 6.7 10*3/uL (ref 4.0–10.5)
nRBC: 0 % (ref 0.0–0.2)

## 2020-03-05 LAB — CMP (CANCER CENTER ONLY)
ALT: 16 U/L (ref 0–44)
AST: 14 U/L — ABNORMAL LOW (ref 15–41)
Albumin: 3.7 g/dL (ref 3.5–5.0)
Alkaline Phosphatase: 84 U/L (ref 38–126)
Anion gap: 13 (ref 5–15)
BUN: 24 mg/dL — ABNORMAL HIGH (ref 8–23)
CO2: 22 mmol/L (ref 22–32)
Calcium: 9.8 mg/dL (ref 8.9–10.3)
Chloride: 104 mmol/L (ref 98–111)
Creatinine: 1.02 mg/dL (ref 0.61–1.24)
GFR, Est AFR Am: >60 mL/min (ref >60)
GFR, Estimated: >60 mL/min (ref >60)
Glucose, Bld: 181 mg/dL — ABNORMAL HIGH (ref 70–99)
Potassium: 3.4 mmol/L — ABNORMAL LOW (ref 3.5–5.1)
Sodium: 139 mmol/L (ref 135–145)
Total Bilirubin: 0.4 mg/dL (ref 0.3–1.2)
Total Protein: 7.4 g/dL (ref 6.5–8.1)

## 2020-03-05 LAB — FERRITIN: Ferritin: 43 ng/mL (ref 24–336)

## 2020-03-05 MED ORDER — SODIUM CHLORIDE 0.9 % IV SOLN
Freq: Once | INTRAVENOUS | Status: AC
  Filled 2020-03-05: qty 250

## 2020-03-05 MED ORDER — LEUPROLIDE ACETATE (4 MONTH) 30 MG IM KIT
30.0000 mg | PACK | Freq: Once | INTRAMUSCULAR | Status: AC
  Administered 2020-03-05: 30 mg via INTRAMUSCULAR
  Filled 2020-03-05: qty 30

## 2020-03-05 MED ORDER — ZOLEDRONIC ACID 4 MG/100ML IV SOLN
4.0000 mg | Freq: Once | INTRAVENOUS | Status: AC
  Administered 2020-03-05: 4 mg via INTRAVENOUS

## 2020-03-05 MED ORDER — ZOLEDRONIC ACID 4 MG/100ML IV SOLN
INTRAVENOUS | Status: AC
  Filled 2020-03-05: qty 100

## 2020-03-05 NOTE — Progress Notes (Signed)
[place] OFFICE PROGRESS NOTE   Diagnosis: Prostate cancer, small bowel cancer  INTERVAL HISTORY:   Brandon Peters returns as scheduled.  He feels well.  Good appetite.  No bleeding.  He is taking iron.  He continues abiraterone and prednisone.  He relates weight loss to a change in his diet. He reports undergoing upper and lower endoscopy procedures by Dr. Benson Peters last month. Objective:  Vital signs in last 24 hours:  Blood pressure (!) 146/80, pulse 92, temperature 97.7 F (36.5 C), temperature source Temporal, resp. rate 18, height 5' 1" (1.549 m), weight 157 lb 8 oz (71.4 kg), SpO2 99 %.     Lymphatics: No cervical or supraclavicular nodes Resp: Lungs clear bilaterally Cardio: Regular rate and rhythm GI: Nontender, no mass, no hepatosplenomegaly Vascular: No leg edema   Lab Results:  Lab Results  Component Value Date   WBC 6.7 03/05/2020   HGB 9.3 (L) 03/05/2020   HCT 30.1 (L) 03/05/2020   MCV 93.2 03/05/2020   PLT 327 03/05/2020   NEUTROABS 4.2 03/05/2020    CMP  Lab Results  Component Value Date   NA 139 03/05/2020   K 3.4 (L) 03/05/2020   CL 104 03/05/2020   CO2 22 03/05/2020   GLUCOSE 181 (H) 03/05/2020   BUN 24 (H) 03/05/2020   CREATININE 1.02 03/05/2020   CALCIUM 9.8 03/05/2020   PROT 7.4 03/05/2020   ALBUMIN 3.7 03/05/2020   AST 14 (L) 03/05/2020   ALT 16 03/05/2020   ALKPHOS 84 03/05/2020   BILITOT 0.4 03/05/2020   GFRNONAA >60 03/05/2020   GFRAA >60 03/05/2020    Lab Results  Component Value Date   CEA1 3.31 05/03/2019     Medications: I have reviewed the patient's current medications.   Assessment/Plan: . History of Severe anemia-status post a red cell transfusion 03/04/2016 2.. Prostate cancer  Extensive sclerotic bone metastases noted on a CT of the abdomen/pelvis 03/04/2016 and MRI abdomen 03/05/2016  Markedly elevated PSA  Lupron initiated 03/15/2016; Casodex 14 days beginning 03/15/2016  Prostate biopsy  03/18/2016  05/09/2016 PSA significantly improved at 21  Abirateroneinitiated08/15/2017  PSA less than 0.1 07/10/2018  PSA less than 0.1 08/30/2018  PSA less than 0.1 10/18/2018  PSA less than 0.1 11/29/2018  PSA less than 0.1 on 11/06/2019   3. Symptoms of obstructive uropathy. Improved   4. Distal duodenal and descending colon masses  biopsy of the duodenal mass 03/04/2016 confirmed fragments of small bowel mucosa with focal high-grade dysplasia  Biopsy of the descending colon "polyps" revealed fragments of a tubulovillous adenoma  CT 08/29/2017-similar soft tissue thickening in the region of the splenic flexure of the colon suboptimally evaluated due to lack of colonic enteric contrast  Referred to Dr. Benson Peters  Referred to Brandon Peters  Resection of descending colon polyps 01/31/2018, tubulovillous adenomas with high-grade dysplasia  Attempted endoscopic resection of the fourth segment duodenal "polyp"on 05/09/2018, firm unresectable lesion, biopsy revealed moderately differentiated adenocarcinoma  CT abdomen/pelvis 05/25/2018-heterogeneously enhancing right lower pole renal lesion; diffuse sclerotic osseous metastatic lesions compatible with history of prostate cancer  CT chest 05/25/2018-no pulmonary metastasis. Diffuse osseous metastasis involving the axial and appendicular skeleton.  06/19/2018-sleeve resection of third and fourth portions of duodenum with primary end-to-end anastomosis, feeding gastrojejunostomy tube placement, Brandon Peters differentiated adenocarcinoma duodenum, 3 cm;tumor invades the muscularis propria; lymphovascular invasion present; negative margins; 4 of 11 involved lymph nodes;soft tissue deposits also seen in the mesentery; all mucosal margins and soft tissue margins negative but  2 of the positive nodes are found in the soft tissue margin submitted for frozen section(pT2 pN2); second mucosal lesion 1.2 cm from the proximal margin, tubular adenoma with  focal severe dysplasia  Intact expression of mismatch repair proteins by IHC; microsatellite stable  Cycle 1 adjuvant Xeloda 07/23/2018  Cycle 2 adjuvant Xeloda 08/13/2018  Cycle 3 adjuvant Xeloda 09/10/2018 (dose reduced to 1000 mg twice daily for 14 days)  Cycle 4 adjuvant Xeloda 10/01/2018 (dose increased to 1500 mg every morning and 1000 mg every afternoon for 14 days)  Cycle 5 adjuvant Xeloda 10/22/2018  Cycle 6 adjuvant Xeloda 11/12/2018  Cycle 7 adjuvant Xeloda 12/03/2018  Cycle 8 adjuvant Xeloda 12/24/2018  5. Anorexia/weight loss-resolved  6. Undescended testicles  7. Right renal mass. CT 08/29/2017-right renal mass involving the lower pole was partially calcified and does demonstrate enhancement following contrast most consistent with renal cell carcinoma. Lesion not significantly enlarged compared with the prior CT. Followed by urology.  8. Diabetes  9.  Anemia-stool Hemoccults + March 2021, referred to Dr. Benson Peters for endoscopic evaluation, anemia also secondary to metastatic prostate cancer, hypogonadism    Disposition: Brandon Peters appears stable.  There is no clinical evidence for progression of the metastatic prostate cancer or small bowel carcinoma.  He will receive Lupron and Zometa today.  He will continue abiraterone/prednisone.  Brandon Peters will return for an office visit and Lupron/Zometa in 4 months.  We will follow up on the endoscopy evaluation by Dr. Benson Peters.  Brandon Peters will call for symptoms of anemia.  Brandon Coder, MD  03/05/2020  9:46 AM

## 2020-03-05 NOTE — Patient Instructions (Signed)
[facility] Discharge Instructions for Patients Receiving Chemotherapy  Today you received the following chemotherapy agents Zometa and Lupron.  To help prevent nausea and vomiting after your treatment, we encourage you to take your nausea medication.    If you develop nausea and vomiting that is not controlled by your nausea medication, call the clinic.   BELOW ARE SYMPTOMS THAT SHOULD BE REPORTED IMMEDIATELY:  *FEVER GREATER THAN 100.5 F  *CHILLS WITH OR WITHOUT FEVER  NAUSEA AND VOMITING THAT IS NOT CONTROLLED WITH YOUR NAUSEA MEDICATION  *UNUSUAL SHORTNESS OF BREATH  *UNUSUAL BRUISING OR BLEEDING  TENDERNESS IN MOUTH AND THROAT WITH OR WITHOUT PRESENCE OF ULCERS  *URINARY PROBLEMS  *BOWEL PROBLEMS  UNUSUAL RASH Items with * indicate a potential emergency and should be followed up as soon as possible.  Feel free to call the clinic should you have any questions or concerns. The clinic phone number is [phone].  Please show the CHEMO ALERT CARD at check-in to the Emergency Department and triage nurse.   

## 2020-03-06 LAB — PROSTATE-SPECIFIC AG, SERUM (LABCORP): Prostate Specific Ag, Serum: <0.1 ng/mL (ref 0.0–4.0)

## 2020-03-16 ENCOUNTER — Other Ambulatory Visit: Payer: Self-pay | Admitting: Oncology

## 2020-03-16 DIAGNOSIS — Z85068 Personal history of other malignant neoplasm of small intestine: Secondary | ICD-10-CM

## 2020-03-16 DIAGNOSIS — C61 Malignant neoplasm of prostate: Secondary | ICD-10-CM

## 2020-03-16 DIAGNOSIS — C179 Malignant neoplasm of small intestine, unspecified: Secondary | ICD-10-CM

## 2020-04-03 ENCOUNTER — Telehealth: Payer: Self-pay | Admitting: Medical Oncology

## 2020-04-03 NOTE — Telephone Encounter (Signed)
Requested path report

## 2020-04-29 ENCOUNTER — Telehealth: Payer: Self-pay | Admitting: *Deleted

## 2020-04-29 NOTE — Telephone Encounter (Signed)
Called to report they will ship drug to [facility] on 8/5 for delivery on 05/05/20. He has been approved from 04/24/20 to 09/25/20 per [employer]. [facility] pharmacy notified.

## 2020-05-04 ENCOUNTER — Encounter: Payer: Self-pay | Admitting: Pharmacy Technician

## 2020-05-04 NOTE — Progress Notes (Signed)
Patient has been approved for drug assistance by [employer] for Eligard. The enrollment period is from 04/24/20-12/31/21based on self pay. First DOS covered is 07/06/20.

## 2020-05-18 ENCOUNTER — Other Ambulatory Visit: Payer: Self-pay | Admitting: Oncology

## 2020-05-18 DIAGNOSIS — Z85068 Personal history of other malignant neoplasm of small intestine: Secondary | ICD-10-CM

## 2020-05-18 DIAGNOSIS — C61 Malignant neoplasm of prostate: Secondary | ICD-10-CM

## 2020-05-18 DIAGNOSIS — C179 Malignant neoplasm of small intestine, unspecified: Secondary | ICD-10-CM

## 2020-06-02 ENCOUNTER — Other Ambulatory Visit: Payer: Self-pay | Admitting: Oncology

## 2020-06-02 MED FILL — predniSONE 5 MG TABS: 5 | 90 days supply | Qty: 90 | Fill #0

## 2020-06-24 NOTE — Progress Notes (Signed)
Patient assistance approved for Eligard.  Lupron 30 mg discontinued and Eligard 30 mg added to supportive plan.  Henreitta Leber, PharmD

## 2020-06-26 ENCOUNTER — Telehealth: Payer: Self-pay | Admitting: *Deleted

## 2020-06-26 NOTE — Telephone Encounter (Signed)
His dose of Eligard due on 07/06/20 is being shipped to [facility] for delivery on 07/01/20. Drug replacement for administration at [facility]. Pharmacy made aware.

## 2020-07-06 ENCOUNTER — Inpatient Hospital Stay: Payer: Self-pay

## 2020-07-06 ENCOUNTER — Other Ambulatory Visit: Payer: Self-pay

## 2020-07-06 ENCOUNTER — Telehealth: Payer: Self-pay | Admitting: Nurse Practitioner

## 2020-07-06 ENCOUNTER — Telehealth: Payer: Self-pay

## 2020-07-06 ENCOUNTER — Inpatient Hospital Stay: Payer: Self-pay | Attending: Nurse Practitioner | Admitting: Nurse Practitioner

## 2020-07-06 ENCOUNTER — Encounter: Payer: Self-pay | Admitting: Nurse Practitioner

## 2020-07-06 VITALS — BP 153/81 | HR 88 | Temp 97.0°F | Resp 18 | Ht 61.0 in | Wt 157.5 lb

## 2020-07-06 DIAGNOSIS — C61 Malignant neoplasm of prostate: Secondary | ICD-10-CM | POA: Insufficient documentation

## 2020-07-06 DIAGNOSIS — C179 Malignant neoplasm of small intestine, unspecified: Secondary | ICD-10-CM

## 2020-07-06 DIAGNOSIS — Z5111 Encounter for antineoplastic chemotherapy: Secondary | ICD-10-CM | POA: Insufficient documentation

## 2020-07-06 DIAGNOSIS — Z23 Encounter for immunization: Secondary | ICD-10-CM | POA: Insufficient documentation

## 2020-07-06 LAB — CBC WITH DIFFERENTIAL (CANCER CENTER ONLY)
Abs Immature Granulocytes: 0.03 10*3/uL (ref 0.00–0.07)
Basophils Absolute: 0 10*3/uL (ref 0.0–0.1)
Basophils Relative: 0 %
Eosinophils Absolute: 0.2 10*3/uL (ref 0.0–0.5)
Eosinophils Relative: 2 %
HCT: 30.4 % — ABNORMAL LOW (ref 39.0–52.0)
Hemoglobin: 9.5 g/dL — ABNORMAL LOW (ref 13.0–17.0)
Immature Granulocytes: 0 %
Lymphocytes Relative: 14 %
Lymphs Abs: 1.2 10*3/uL (ref 0.7–4.0)
MCH: 29.1 pg (ref 26.0–34.0)
MCHC: 31.3 g/dL (ref 30.0–36.0)
MCV: 93.3 fL (ref 80.0–100.0)
Monocytes Absolute: 0.7 10*3/uL (ref 0.1–1.0)
Monocytes Relative: 8 %
Neutro Abs: 6.2 10*3/uL (ref 1.7–7.7)
Neutrophils Relative %: 76 %
Platelet Count: 297 10*3/uL (ref 150–400)
RBC: 3.26 MIL/uL — ABNORMAL LOW (ref 4.22–5.81)
RDW: 13.2 % (ref 11.5–15.5)
WBC Count: 8.3 10*3/uL (ref 4.0–10.5)
nRBC: 0 % (ref 0.0–0.2)

## 2020-07-06 LAB — CMP (CANCER CENTER ONLY)
ALT: 15 U/L (ref 0–44)
AST: 13 U/L — ABNORMAL LOW (ref 15–41)
Albumin: 3.6 g/dL (ref 3.5–5.0)
Alkaline Phosphatase: 81 U/L (ref 38–126)
Anion gap: 6 (ref 5–15)
BUN: 21 mg/dL (ref 8–23)
CO2: 26 mmol/L (ref 22–32)
Calcium: 10 mg/dL (ref 8.9–10.3)
Chloride: 104 mmol/L (ref 98–111)
Creatinine: 1.02 mg/dL (ref 0.61–1.24)
GFR, Estimated: >60 mL/min (ref >60)
Glucose, Bld: 158 mg/dL — ABNORMAL HIGH (ref 70–99)
Potassium: 3.4 mmol/L — ABNORMAL LOW (ref 3.5–5.1)
Sodium: 136 mmol/L (ref 135–145)
Total Bilirubin: 0.3 mg/dL (ref 0.3–1.2)
Total Protein: 7.2 g/dL (ref 6.5–8.1)

## 2020-07-06 LAB — FERRITIN: Ferritin: 54 ng/mL (ref 24–336)

## 2020-07-06 MED ORDER — INFLUENZA VAC SPLIT QUAD 0.5 ML IM SUSY
0.5000 mL | PREFILLED_SYRINGE | Freq: Once | INTRAMUSCULAR | Status: AC
  Administered 2020-07-06: 0.5 mL via INTRAMUSCULAR

## 2020-07-06 MED ORDER — LEUPROLIDE ACETATE (4 MONTH) 30 MG ~~LOC~~ KIT
PACK | SUBCUTANEOUS | Status: AC
  Filled 2020-07-06: qty 30

## 2020-07-06 MED ORDER — ZOLEDRONIC ACID 4 MG/100ML IV SOLN
4.0000 mg | Freq: Once | INTRAVENOUS | Status: AC
  Administered 2020-07-06: 4 mg via INTRAVENOUS

## 2020-07-06 MED ORDER — OCTREOTIDE ACETATE 30 MG IM KIT
PACK | INTRAMUSCULAR | Status: AC
  Filled 2020-07-06: qty 1

## 2020-07-06 MED ORDER — LEUPROLIDE ACETATE (4 MONTH) 30 MG ~~LOC~~ KIT
30.0000 mg | PACK | Freq: Once | SUBCUTANEOUS | Status: AC
  Administered 2020-07-06: 30 mg via SUBCUTANEOUS

## 2020-07-06 MED ORDER — INFLUENZA VAC SPLIT QUAD 0.5 ML IM SUSY
PREFILLED_SYRINGE | INTRAMUSCULAR | Status: AC
  Filled 2020-07-06: qty 0.5

## 2020-07-06 MED ORDER — ZOLEDRONIC ACID 4 MG/100ML IV SOLN
INTRAVENOUS | Status: AC
  Filled 2020-07-06: qty 100

## 2020-07-06 MED ORDER — SODIUM CHLORIDE 0.9 % IV SOLN
Freq: Once | INTRAVENOUS | Status: AC
  Filled 2020-07-06: qty 250

## 2020-07-06 NOTE — Telephone Encounter (Signed)
-----   Message from Owens Shark, NP sent at 07/06/2020 10:23 AM EDT ----- Please get a copy of upper endoscopy by Dr Benson Norway 02/11/2020

## 2020-07-06 NOTE — Progress Notes (Signed)
Received fax from [facility] from [facility]. Given to [facility] NP.

## 2020-07-06 NOTE — Telephone Encounter (Signed)
Called to have endoscopy faxed over per [facility] NP. Will send fax to [phone].

## 2020-07-06 NOTE — Addendum Note (Signed)
Addended by: Raynelle Bring on: 07/06/2020 10:55 AM   Modules accepted: Orders

## 2020-07-06 NOTE — Patient Instructions (Signed)
[facility] Discharge Instructions for Patients Receiving Chemotherapy  Today you received the following chemotherapy agents Zometa and Lupron.  To help prevent nausea and vomiting after your treatment, we encourage you to take your nausea medication.    If you develop nausea and vomiting that is not controlled by your nausea medication, call the clinic.   BELOW ARE SYMPTOMS THAT SHOULD BE REPORTED IMMEDIATELY:  *FEVER GREATER THAN 100.5 F  *CHILLS WITH OR WITHOUT FEVER  NAUSEA AND VOMITING THAT IS NOT CONTROLLED WITH YOUR NAUSEA MEDICATION  *UNUSUAL SHORTNESS OF BREATH  *UNUSUAL BRUISING OR BLEEDING  TENDERNESS IN MOUTH AND THROAT WITH OR WITHOUT PRESENCE OF ULCERS  *URINARY PROBLEMS  *BOWEL PROBLEMS  UNUSUAL RASH Items with * indicate a potential emergency and should be followed up as soon as possible.  Feel free to call the clinic should you have any questions or concerns. The clinic phone number is [phone].  Please show the [place] at check-in to the Emergency Department and triage nurse.

## 2020-07-06 NOTE — Telephone Encounter (Signed)
Scheduled per 10/11 los. Printed avs and calendar for pt.  

## 2020-07-06 NOTE — Progress Notes (Addendum)
[place] OFFICE PROGRESS NOTE   Diagnosis: Prostate cancer, small bowel cancer  INTERVAL HISTORY:   Brandon Peters returns as scheduled.  He feels well.  No nausea or vomiting.  No mouth sores.  No diarrhea.  He denies bleeding.  He denies pain.  He describes his appetite as "great".  Objective:  Vital signs in last 24 hours:  Blood pressure (!) 153/81, pulse 88, temperature (!) 97 F (36.1 C), temperature source Tympanic, resp. rate 18, height '5\' 1"'  (1.549 m), weight 157 lb 8 oz (71.4 kg), SpO2 99 %.    HEENT: No thrush or ulcers. Lymphatics: No palpable cervical, supraclavicular, axillary or inguinal lymph nodes. Resp: Lungs clear bilaterally. Cardio: Regular rate and rhythm. GI: Abdomen soft and nontender.  No hepatomegaly. Vascular: Trace bilateral pretibial edema.   Lab Results:  Lab Results  Component Value Date   WBC 8.3 07/06/2020   HGB 9.5 (L) 07/06/2020   HCT 30.4 (L) 07/06/2020   MCV 93.3 07/06/2020   PLT 297 07/06/2020   NEUTROABS 6.2 07/06/2020    Imaging:  No results found.  Medications: I have reviewed the patient's current medications.  Assessment/Plan: .History of Severe anemia-status post a red cell transfusion 03/04/2016 2.. Prostate cancer  Extensive sclerotic bone metastases noted on a CT of the abdomen/pelvis 03/04/2016 and MRI abdomen 03/05/2016  Markedly elevated PSA  Lupron initiated 03/15/2016; Casodex 14 days beginning 03/15/2016  Prostate biopsy 03/18/2016  05/09/2016 PSA significantly improved at 21  Abirateroneinitiated08/15/2017  PSA less than 0.1 07/10/2018  PSA less than 0.1 08/30/2018  PSA less than 0.1 10/18/2018  PSA less than 0.1 11/29/2018  PSA less than 0.1 on 11/06/2019   PSA less than 0.1 on 03/05/2020  3. Symptoms of obstructive uropathy. Improved   4. Distal duodenal and descending colon masses  biopsy of the duodenal mass 03/04/2016 confirmed fragments of small bowel mucosa with  focal high-grade dysplasia  Biopsy of the descending colon "polyps" revealed fragments of a tubulovillous adenoma  CT 08/29/2017-similar soft tissue thickening in the region of the splenic flexure of the colon suboptimally evaluated due to lack of colonic enteric contrast  Referred to Dr. Benson Norway  Referred to [facility]  Resection of descending colon polyps 01/31/2018, tubulovillous adenomas with high-grade dysplasia  Attempted endoscopic resection of the fourth segment duodenal "polyp"on 05/09/2018, firm unresectable lesion, biopsy revealed moderately differentiated adenocarcinoma  CT abdomen/pelvis 05/25/2018-heterogeneously enhancing right lower pole renal lesion; diffuse sclerotic osseous metastatic lesions compatible with history of prostate cancer  CT chest 05/25/2018-no pulmonary metastasis. Diffuse osseous metastasis involving the axial and appendicular skeleton.  06/19/2018-sleeve resection of third and fourth portions of duodenum with primary end-to-end anastomosis, feeding gastrojejunostomy tube placement, Dr. Stark Klein differentiated adenocarcinoma duodenum, 3 cm;tumor invades the muscularis propria; lymphovascular invasion present; negative margins; 4 of 11 involved lymph nodes;soft tissue deposits also seen in the mesentery; all mucosal margins and soft tissue margins negative but 2 of the positive nodes are found in the soft tissue margin submitted for frozen section(pT2 pN2); second mucosal lesion 1.2 cm from the proximal margin, tubular adenoma with focal severe dysplasia  Intact expression of mismatch repair proteins by IHC; microsatellite stable  Cycle 1 adjuvant Xeloda 07/23/2018  Cycle 2 adjuvant Xeloda 08/13/2018  Cycle 3 adjuvant Xeloda 09/10/2018 (dose reduced to 1000 mg twice daily for 14 days)  Cycle 4 adjuvant Xeloda 10/01/2018 (dose increased to 1500 mg every morning and 1000 mg every afternoon for 14 days)  Cycle 5 adjuvant Xeloda 10/22/2018  Cycle  6 adjuvant  Xeloda 11/12/2018  Cycle 7 adjuvant Xeloda 12/03/2018  Cycle 8 adjuvant Xeloda 12/24/2018  5. Anorexia/weight loss-resolved  6. Undescended testicles  7. Right renal mass. CT 08/29/2017-right renal mass involving the lower pole was partially calcified and does demonstrate enhancement following contrast most consistent with renal cell carcinoma. Lesion not significantly enlarged compared with the prior CT. Followed by urology.  8. Diabetes  9.  Anemia-stool Hemoccults + March 2021, referred to Dr. 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  Hematin found in the gastric body.  Examined duodenum normal.  No evidence of significant pathology in the entire examined portion of the jejunum.  There was no overt source of bleeding or inflammation.  The bleeding may be from small AVMs.  Surgical anastomosis was not visible with repeated inspection of the duodenum.  Colonoscopy 02/11/2020-multiple sessile and semipedunculated polyps were found in the rectum, descending colon and ascending colon, 3 to 18 mm in size.  Polyps removed (polypectomy descending colon-fragments of tubular adenoma, inflammatory type polyp; polypectomy ascending colon-tubular adenoma, no high-grade dysplasia or malignancy; polypectomy descending colon-fragments of tubulovillous adenoma, no high-grade dysplasia or malignancy; polypectomy colon, rectum-tubular adenoma, no high-grade dysplasia or malignancy).  Disposition: Brandon Peters appears well.  There is no clinical evidence for progression of the metastatic prostate cancer or small bowel carcinoma.  He will continue abiraterone/prednisone.  We will follow-up on the PSA, ferritin and chemistry panel from today.  He will receive Lupron and Zometa today.  He will return for follow-up and Lupron/Zometa in 4 months.  We reviewed the CBC from today.  Hemoglobin is stable to  slightly improved.  Influenza vaccine today.  He will return in 2 weeks for the Covid booster.    Ned Card ANP/GNP-BC   07/06/2020  10:27 AM

## 2020-07-07 LAB — PROSTATE-SPECIFIC AG, SERUM (LABCORP): Prostate Specific Ag, Serum: <0.1 ng/mL (ref 0.0–4.0)

## 2020-07-16 NOTE — Progress Notes (Signed)
The following Medication: Eligard has been approved thru [employer] as Assistance Program. Enrollment period is 09/26/2020 to 12/31/20220. Medication is ordered prior to appointment to be on hand for treatment.  First DOS: 11/06/2020

## 2020-07-18 ENCOUNTER — Other Ambulatory Visit: Payer: Self-pay | Admitting: Oncology

## 2020-07-18 DIAGNOSIS — C179 Malignant neoplasm of small intestine, unspecified: Secondary | ICD-10-CM

## 2020-07-18 DIAGNOSIS — Z85068 Personal history of other malignant neoplasm of small intestine: Secondary | ICD-10-CM

## 2020-07-18 DIAGNOSIS — C61 Malignant neoplasm of prostate: Secondary | ICD-10-CM

## 2020-07-20 ENCOUNTER — Other Ambulatory Visit: Payer: Self-pay

## 2020-07-20 ENCOUNTER — Inpatient Hospital Stay: Payer: Self-pay

## 2020-07-20 DIAGNOSIS — Z23 Encounter for immunization: Secondary | ICD-10-CM

## 2020-08-31 ENCOUNTER — Other Ambulatory Visit: Payer: Self-pay | Admitting: Oncology

## 2020-08-31 MED FILL — predniSONE 5 MG TABS: 5 | 90 days supply | Qty: 90 | Fill #0

## 2020-10-12 ENCOUNTER — Other Ambulatory Visit: Payer: Self-pay | Admitting: Oncology

## 2020-10-12 DIAGNOSIS — Z85068 Personal history of other malignant neoplasm of small intestine: Secondary | ICD-10-CM

## 2020-10-12 DIAGNOSIS — C179 Malignant neoplasm of small intestine, unspecified: Secondary | ICD-10-CM

## 2020-10-12 DIAGNOSIS — C61 Malignant neoplasm of prostate: Secondary | ICD-10-CM

## 2020-11-06 ENCOUNTER — Inpatient Hospital Stay: Payer: Medicare Other | Attending: Oncology

## 2020-11-06 ENCOUNTER — Inpatient Hospital Stay: Payer: Medicare Other

## 2020-11-06 ENCOUNTER — Other Ambulatory Visit: Payer: Self-pay

## 2020-11-06 ENCOUNTER — Inpatient Hospital Stay: Payer: Medicare Other | Admitting: Oncology

## 2020-11-06 VITALS — BP 152/80 | HR 97 | Temp 97.8°F | Resp 18 | Ht 61.0 in | Wt 146.9 lb

## 2020-11-06 DIAGNOSIS — C7951 Secondary malignant neoplasm of bone: Secondary | ICD-10-CM | POA: Diagnosis present

## 2020-11-06 DIAGNOSIS — Z5111 Encounter for antineoplastic chemotherapy: Secondary | ICD-10-CM | POA: Diagnosis not present

## 2020-11-06 DIAGNOSIS — C61 Malignant neoplasm of prostate: Secondary | ICD-10-CM | POA: Diagnosis not present

## 2020-11-06 DIAGNOSIS — C179 Malignant neoplasm of small intestine, unspecified: Secondary | ICD-10-CM

## 2020-11-06 DIAGNOSIS — Z79899 Other long term (current) drug therapy: Secondary | ICD-10-CM | POA: Insufficient documentation

## 2020-11-06 LAB — CBC WITH DIFFERENTIAL (CANCER CENTER ONLY)
Abs Immature Granulocytes: 0.03 10*3/uL (ref 0.00–0.07)
Basophils Absolute: 0 10*3/uL (ref 0.0–0.1)
Basophils Relative: 1 %
Eosinophils Absolute: 0.1 10*3/uL (ref 0.0–0.5)
Eosinophils Relative: 2 %
HCT: 32.3 % — ABNORMAL LOW (ref 39.0–52.0)
Hemoglobin: 10 g/dL — ABNORMAL LOW (ref 13.0–17.0)
Immature Granulocytes: 0 %
Lymphocytes Relative: 24 %
Lymphs Abs: 1.7 10*3/uL (ref 0.7–4.0)
MCH: 29.9 pg (ref 26.0–34.0)
MCHC: 31 g/dL (ref 30.0–36.0)
MCV: 96.7 fL (ref 80.0–100.0)
Monocytes Absolute: 0.5 10*3/uL (ref 0.1–1.0)
Monocytes Relative: 8 %
Neutro Abs: 4.5 10*3/uL (ref 1.7–7.7)
Neutrophils Relative %: 65 %
Platelet Count: 285 10*3/uL (ref 150–400)
RBC: 3.34 MIL/uL — ABNORMAL LOW (ref 4.22–5.81)
RDW: 13.5 % (ref 11.5–15.5)
WBC Count: 6.9 10*3/uL (ref 4.0–10.5)
nRBC: 0 % (ref 0.0–0.2)

## 2020-11-06 LAB — CMP (CANCER CENTER ONLY)
ALT: 13 U/L (ref 0–44)
AST: 13 U/L — ABNORMAL LOW (ref 15–41)
Albumin: 4 g/dL (ref 3.5–5.0)
Alkaline Phosphatase: 66 U/L (ref 38–126)
Anion gap: 10 (ref 5–15)
BUN: 29 mg/dL — ABNORMAL HIGH (ref 8–23)
CO2: 24 mmol/L (ref 22–32)
Calcium: 9.8 mg/dL (ref 8.9–10.3)
Chloride: 105 mmol/L (ref 98–111)
Creatinine: 1.13 mg/dL (ref 0.61–1.24)
GFR, Estimated: >60 mL/min (ref >60)
Glucose, Bld: 115 mg/dL — ABNORMAL HIGH (ref 70–99)
Potassium: 3.3 mmol/L — ABNORMAL LOW (ref 3.5–5.1)
Sodium: 139 mmol/L (ref 135–145)
Total Bilirubin: 0.5 mg/dL (ref 0.3–1.2)
Total Protein: 7.6 g/dL (ref 6.5–8.1)

## 2020-11-06 LAB — FERRITIN: Ferritin: 79 ng/mL (ref 24–336)

## 2020-11-06 MED ORDER — ZOLEDRONIC ACID 4 MG/100ML IV SOLN
4.0000 mg | Freq: Once | INTRAVENOUS | Status: AC
  Administered 2020-11-06: 4 mg via INTRAVENOUS

## 2020-11-06 MED ORDER — LEUPROLIDE ACETATE (4 MONTH) 30 MG ~~LOC~~ KIT
PACK | SUBCUTANEOUS | Status: AC
  Filled 2020-11-06: qty 30

## 2020-11-06 MED ORDER — SODIUM CHLORIDE 0.9 % IV SOLN
Freq: Once | INTRAVENOUS | Status: AC
  Filled 2020-11-06: qty 250

## 2020-11-06 MED ORDER — ZOLEDRONIC ACID 4 MG/100ML IV SOLN
INTRAVENOUS | Status: AC
  Filled 2020-11-06: qty 100

## 2020-11-06 MED ORDER — LEUPROLIDE ACETATE (4 MONTH) 30 MG ~~LOC~~ KIT
30.0000 mg | PACK | Freq: Once | SUBCUTANEOUS | Status: AC
  Administered 2020-11-06: 30 mg via SUBCUTANEOUS

## 2020-11-06 NOTE — Progress Notes (Signed)
[place] OFFICE PROGRESS NOTE   Diagnosis: Prostate cancer, history of small bowel cancer  INTERVAL HISTORY:   [name] returns as scheduled. He feels well. No difficulty with urination. He developed nausea and weight loss when the Metformin dose was increased. His appetite and nausea have improved with reduction of the Metformin dose.  Objective:  Vital signs in last 24 hours:  Blood pressure (!) 152/80, pulse 97, temperature 97.8 F (36.6 C), temperature source Tympanic, resp. rate 18, height '5\' 1"'  (1.549 m), weight 146 lb 14.4 oz (66.6 kg), SpO2 100 %.   Lymphatics: No cervical, supraclavicular, axillary, or inguinal nodes Resp: Lungs clear bilaterally Cardio: Regular rate and rhythm with premature beats GI: No hepatosplenomegaly, no mass, nontender Vascular: No leg edema    Lab Results:  Lab Results  Component Value Date   WBC 6.9 11/06/2020   HGB 10.0 (L) 11/06/2020   HCT 32.3 (L) 11/06/2020   MCV 96.7 11/06/2020   PLT 285 11/06/2020   NEUTROABS 4.5 11/06/2020    CMP  Lab Results  Component Value Date   NA 139 11/06/2020   K 3.3 (L) 11/06/2020   CL 105 11/06/2020   CO2 24 11/06/2020   GLUCOSE 115 (H) 11/06/2020   BUN 29 (H) 11/06/2020   CREATININE 1.13 11/06/2020   CALCIUM 9.8 11/06/2020   PROT 7.6 11/06/2020   ALBUMIN 4.0 11/06/2020   AST 13 (L) 11/06/2020   ALT 13 11/06/2020   ALKPHOS 66 11/06/2020   BILITOT 0.5 11/06/2020   GFRNONAA >60 11/06/2020   GFRAA >60 03/05/2020    Lab Results  Component Value Date   CEA1 3.31 05/03/2019    Medications: I have reviewed the patient's current medications.   Assessment/Plan: 1.History of Severe anemia-status post a red cell transfusion 03/04/2016 2.. Prostate cancer  Extensive sclerotic bone metastases noted on a CT of the abdomen/pelvis 03/04/2016 and MRI abdomen 03/05/2016  Markedly elevated PSA  Lupron initiated 03/15/2016; Casodex 14 days beginning 03/15/2016  Prostate  biopsy 03/18/2016  05/09/2016 PSA significantly improved at 21  Abirateroneinitiated08/15/2017  PSA less than 0.1 07/10/2018  PSA less than 0.1 08/30/2018  PSA less than 0.1 10/18/2018  PSA less than 0.1 11/29/2018  PSA less than 0.1 on 11/06/2019   PSA less than 0.1 on 03/05/2020  PSA less than 0.1 on 07/06/2020  3. Symptoms of obstructive uropathy. Improved   4. Distal duodenal and descending colon masses  biopsy of the duodenal mass 03/04/2016 confirmed fragments of small bowel mucosa with focal high-grade dysplasia  Biopsy of the descending colon "polyps" revealed fragments of a tubulovillous adenoma  CT 08/29/2017-similar soft tissue thickening in the region of the splenic flexure of the colon suboptimally evaluated due to lack of colonic enteric contrast  Referred to Dr. Benson Norway  Referred to [facility]  Resection of descending colon polyps 01/31/2018, tubulovillous adenomas with high-grade dysplasia  Attempted endoscopic resection of the fourth segment duodenal "polyp"on 05/09/2018, firm unresectable lesion, biopsy revealed moderately differentiated adenocarcinoma  CT abdomen/pelvis 05/25/2018-heterogeneously enhancing right lower pole renal lesion; diffuse sclerotic osseous metastatic lesions compatible with history of prostate cancer  CT chest 05/25/2018-no pulmonary metastasis. Diffuse osseous metastasis involving the axial and appendicular skeleton.  06/19/2018-sleeve resection of third and fourth portions of duodenum with primary end-to-end anastomosis, feeding gastrojejunostomy tube placement, Dr. Stark Klein differentiated adenocarcinoma duodenum, 3 cm;tumor invades the muscularis propria; lymphovascular invasion present; negative margins; 4 of 11 involved lymph nodes;soft tissue deposits also seen in the mesentery; all mucosal margins and  soft tissue margins negative but 2 of the positive nodes are found in the soft tissue margin submitted for frozen section(pT2  pN2); second mucosal lesion 1.2 cm from the proximal margin, tubular adenoma with focal severe dysplasia  Intact expression of mismatch repair proteins by IHC; microsatellite stable  Cycle 1 adjuvant Xeloda 07/23/2018  Cycle 2 adjuvant Xeloda 08/13/2018  Cycle 3 adjuvant Xeloda 09/10/2018 (dose reduced to 1000 mg twice daily for 14 days)  Cycle 4 adjuvant Xeloda 10/01/2018 (dose increased to 1500 mg every morning and 1000 mg every afternoon for 14 days)  Cycle 5 adjuvant Xeloda 10/22/2018  Cycle 6 adjuvant Xeloda 11/12/2018  Cycle 7 adjuvant Xeloda 12/03/2018  Cycle 8 adjuvant Xeloda 12/24/2018  5. Anorexia/weight loss-resolved  6. Undescended testicles  7. Right renal mass. CT 08/29/2017-right renal mass involving the lower pole was partially calcified and does demonstrate enhancement following contrast most consistent with renal cell carcinoma. Lesion not significantly enlarged compared with the prior CT. Followed by urology.  8. Diabetes  9.  Anemia-stool Hemoccults + March 2021, referred to Dr. 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  Hematin found in the gastric body.  Examined duodenum normal.  No evidence of significant pathology in the entire examined portion of the jejunum.  There was no overt source of bleeding or inflammation.  The bleeding may be from small AVMs.  Surgical anastomosis was not visible with repeated inspection of the duodenum.  Colonoscopy 02/11/2020-multiple sessile and semipedunculated polyps were found in the rectum, descending colon and ascending colon, 3 to 18 mm in size.  Polyps removed (polypectomy descending colon-fragments of tubular adenoma, inflammatory type polyp; polypectomy ascending colon-tubular adenoma, no high-grade dysplasia or malignancy; polypectomy descending colon-fragments of tubulovillous adenoma, no high-grade dysplasia  or malignancy; polypectomy colon, rectum-tubular adenoma, no high-grade dysplasia or malignancy).    Disposition: Brandon Peters remains in clinical remission from prostate cancer. 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 He will continue abiraterone and Lupron. He continues endoscopic surveillance with Dr. Benson Norway.  Brandon Peters will return for an office visit, Zometa, and Lupron in 4 months.  He relates recent weight loss to an increase in the Metformin dose. His appetite has improved and nausea has resolved with a decrease in the metformin dose. He will call for recurrent anorexia.    [name], MD  11/06/2020  10:36 AM

## 2020-11-06 NOTE — Patient Instructions (Signed)
Zoledronic Acid Injection (Hypercalcemia, Oncology) What is this medicine? ZOLEDRONIC ACID (ZOE le dron ik AS id) slows calcium loss from bones. It high calcium levels in the blood from some kinds of cancer. It may be used in other people at risk for bone loss. This medicine may be used for other purposes; ask your health care provider or pharmacist if you have questions. COMMON BRAND NAME(S): Zometa What should I tell my health care provider before I take this medicine? They need to know if you have any of these conditions:  cancer  dehydration  dental disease  kidney disease  liver disease  low levels of calcium in the blood  lung or breathing disease (asthma)  receiving steroids like dexamethasone or prednisone  an unusual or allergic reaction to zoledronic acid, other medicines, foods, dyes, or preservatives  pregnant or trying to get pregnant  breast-feeding How should I use this medicine? This drug is injected into a vein. It is given by a health care provider in a hospital or clinic setting. Talk to your health care provider about the use of this drug in children. Special care may be needed. Overdosage: If you think you have taken too much of this medicine contact a poison control center or emergency room at once. NOTE: This medicine is only for you. Do not share this medicine with others. What if I miss a dose? Keep appointments for follow-up doses. It is important not to miss your dose. Call your health care provider if you are unable to keep an appointment. What may interact with this medicine?  certain antibiotics given by injection  NSAIDs, medicines for pain and inflammation, like ibuprofen or naproxen  some diuretics like bumetanide, furosemide  teriparatide  thalidomide This list may not describe all possible interactions. Give your health care provider a list of all the medicines, herbs, non-prescription drugs, or dietary supplements you use. Also tell  them if you smoke, drink alcohol, or use illegal drugs. Some items may interact with your medicine. What should I watch for while using this medicine? Visit your health care provider for regular checks on your progress. It may be some time before you see the benefit from this drug. Some people who take this drug have severe bone, joint, or muscle pain. This drug may also increase your risk for jaw problems or a broken thigh bone. Tell your health care provider right away if you have severe pain in your jaw, bones, joints, or muscles. Tell you health care provider if you have any pain that does not go away or that gets worse. Tell your dentist and dental surgeon that you are taking this drug. You should not have major dental surgery while on this drug. See your dentist to have a dental exam and fix any dental problems before starting this drug. Take good care of your teeth while on this drug. Make sure you see your dentist for regular follow-up appointments. You should make sure you get enough calcium and vitamin D while you are taking this drug. Discuss the foods you eat and the vitamins you take with your health care provider. Check with your health care provider if you have severe diarrhea, nausea, and vomiting, or if you sweat a lot. The loss of too much body fluid may make it dangerous for you to take this drug. You may need blood work done while you are taking this drug. Do not become pregnant while taking this drug. Women should inform their health care provider   if they wish to become pregnant or think they might be pregnant. There is potential for serious harm to an unborn child. Talk to your health care provider for more information. What side effects may I notice from receiving this medicine? Side effects that you should report to your doctor or health care provider as soon as possible:  allergic reactions (skin rash, itching or hives; swelling of the face, lips, or tongue)  bone  pain  infection (fever, chills, cough, sore throat, pain or trouble passing urine)  jaw pain, especially after dental work  joint pain  kidney injury (trouble passing urine or change in the amount of urine)  low blood pressure (dizziness; feeling faint or lightheaded, falls; unusually weak or tired)  low calcium levels (fast heartbeat; muscle cramps or pain; pain, tingling, or numbness in the hands or feet; seizures)  low magnesium levels (fast, irregular heartbeat; muscle cramp or pain; muscle weakness; tremors; seizures)  low red blood cell counts (trouble breathing; feeling faint; lightheaded, falls; unusually weak or tired)  muscle pain  redness, blistering, peeling, or loosening of the skin, including inside the mouth  severe diarrhea  swelling of the ankles, feet, hands  trouble breathing Side effects that usually do not require medical attention (report to your doctor or health care provider if they continue or are bothersome):  anxious  constipation  coughing  depressed mood  eye irritation, itching, or pain  fever  general ill feeling or flu-like symptoms  nausea  pain, redness, or irritation at site where injected  trouble sleeping This list may not describe all possible side effects. Call your doctor for medical advice about side effects. You may report side effects to FDA at 1-800-FDA-1088. Where should I keep my medicine? This drug is given in a hospital or clinic. It will not be stored at home. NOTE: This sheet is a summary. It may not cover all possible information. If you have questions about this medicine, talk to your doctor, pharmacist, or health care provider.  2021 Elsevier/Gold Standard (2019-06-27 09:13:00)  Leuprolide depot injection What is this medicine? LEUPROLIDE (loo PROE lide) is a man-made protein that acts like a natural hormone in the body. It decreases testosterone in men and decreases estrogen in women. In men, this medicine  is used to treat advanced prostate cancer. In women, some forms of this medicine may be used to treat endometriosis, uterine fibroids, or other male hormone-related problems. This medicine may be used for other purposes; ask your health care provider or pharmacist if you have questions. COMMON BRAND NAME(S): Eligard, Fensolv, Lupron Depot, Lupron Depot-Ped, Viadur What should I tell my health care provider before I take this medicine? They need to know if you have any of these conditions:  diabetes  heart disease or previous heart attack  high blood pressure  high cholesterol  mental illness  osteoporosis  pain or difficulty passing urine  seizures  spinal cord metastasis  stroke  suicidal thoughts, plans, or attempt; a previous suicide attempt by you or a family member  tobacco smoker  unusual vaginal bleeding (women)  an unusual or allergic reaction to leuprolide, benzyl alcohol, other medicines, foods, dyes, or preservatives  pregnant or trying to get pregnant  breast-feeding How should I use this medicine? This medicine is for injection into a muscle or for injection under the skin. It is given by a health care professional in a hospital or clinic setting. The specific product will determine how it will be given to  you. Make sure you understand which product you receive and how often you will receive it. Talk to your pediatrician regarding the use of this medicine in children. Special care may be needed. Overdosage: If you think you have taken too much of this medicine contact a poison control center or emergency room at once. NOTE: This medicine is only for you. Do not share this medicine with others. What if I miss a dose? It is important not to miss a dose. Call your doctor or health care professional if you are unable to keep an appointment. Depot injections: Depot injections are given either once-monthly, every 12 weeks, every 16 weeks, or every 24 weeks  depending on the product you are prescribed. The product you are prescribed will be based on if you are male or male, and your condition. Make sure you understand your product and dosing. What may interact with this medicine? Do not take this medicine with any of the following medications:  chasteberry  cisapride  dronedarone  pimozide  thioridazine This medicine may also interact with the following medications:  herbal or dietary supplements, like black cohosh or DHEA  male hormones, like estrogens or progestins and birth control pills, patches, rings, or injections  male hormones, like testosterone  other medicines that prolong the QT interval (abnormal heart rhythm) This list may not describe all possible interactions. Give your health care provider a list of all the medicines, herbs, non-prescription drugs, or dietary supplements you use. Also tell them if you smoke, drink alcohol, or use illegal drugs. Some items may interact with your medicine. What should I watch for while using this medicine? Visit your doctor or health care professional for regular checks on your progress. During the first weeks of treatment, your symptoms may get worse, but then will improve as you continue your treatment. You may get hot flashes, increased bone pain, increased difficulty passing urine, or an aggravation of nerve symptoms. Discuss these effects with your doctor or health care professional, some of them may improve with continued use of this medicine. Male patients may experience a menstrual cycle or spotting during the first months of therapy with this medicine. If this continues, contact your doctor or health care professional. This medicine may increase blood sugar. Ask your healthcare provider if changes in diet or medicines are needed if you have diabetes. What side effects may I notice from receiving this medicine? Side effects that you should report to your doctor or health care  professional as soon as possible:  allergic reactions like skin rash, itching or hives, swelling of the face, lips, or tongue  breathing problems  chest pain  depression or memory disorders  pain in your legs or groin  pain at site where injected or implanted  seizures  severe headache  signs and symptoms of high blood sugar such as being more thirsty or hungry or having to urinate more than normal. You may also feel very tired or have blurry vision  swelling of the feet and legs  suicidal thoughts or other mood changes  visual changes  vomiting Side effects that usually do not require medical attention (report to your doctor or health care professional if they continue or are bothersome):  breast swelling or tenderness  decrease in sex drive or performance  diarrhea  hot flashes  loss of appetite  muscle, joint, or bone pains  nausea  redness or irritation at site where injected or implanted  skin problems or acne This list may  not describe all possible side effects. Call your doctor for medical advice about side effects. You may report side effects to FDA at 1-800-FDA-1088. Where should I keep my medicine? This drug is given in a hospital or clinic and will not be stored at home. NOTE: This sheet is a summary. It may not cover all possible information. If you have questions about this medicine, talk to your doctor, pharmacist, or health care provider.  2021 Elsevier/Gold Standard (2019-08-14 10:35:13)

## 2020-11-07 LAB — PROSTATE-SPECIFIC AG, SERUM (LABCORP): Prostate Specific Ag, Serum: <0.1 ng/mL (ref 0.0–4.0)

## 2020-11-09 ENCOUNTER — Telehealth: Payer: Self-pay | Admitting: Oncology

## 2020-11-09 NOTE — Telephone Encounter (Signed)
Scheduled appointments per 2/11 los. Called patient, no answer. Left message with appointments date and times.

## 2020-11-11 ENCOUNTER — Telehealth: Payer: Self-pay

## 2020-11-11 NOTE — Telephone Encounter (Signed)
-----   Message from Owens Shark, NP sent at 11/11/2020  8:13 AM EST ----- Please make sure he is taking potassium

## 2020-11-11 NOTE — Telephone Encounter (Signed)
Spoke with pt confirmed he is taking potassium as directed pt also mentions moving forward would like prednisone to be switched to [facility] on [address] (same pharm he is picking potassium up at ).

## 2020-11-15 ENCOUNTER — Other Ambulatory Visit: Payer: Self-pay | Admitting: Oncology

## 2020-11-15 DIAGNOSIS — C61 Malignant neoplasm of prostate: Secondary | ICD-10-CM

## 2020-11-15 DIAGNOSIS — Z85068 Personal history of other malignant neoplasm of small intestine: Secondary | ICD-10-CM

## 2020-11-15 DIAGNOSIS — C179 Malignant neoplasm of small intestine, unspecified: Secondary | ICD-10-CM

## 2020-11-30 ENCOUNTER — Other Ambulatory Visit: Payer: Self-pay | Admitting: *Deleted

## 2020-11-30 MED ORDER — PREDNISONE 5 MG PO TABS: 5.0000 mg | ORAL_TABLET | Freq: Every day | ORAL | 0 refills | Status: DC

## 2020-11-30 NOTE — Telephone Encounter (Signed)
Left message requesting refill on his Prednisone and wants it sent to [facility] on [address]

## 2020-12-30 ENCOUNTER — Encounter: Payer: Self-pay | Admitting: Nurse Practitioner

## 2021-01-20 ENCOUNTER — Other Ambulatory Visit: Payer: Self-pay | Admitting: Oncology

## 2021-01-20 DIAGNOSIS — C61 Malignant neoplasm of prostate: Secondary | ICD-10-CM

## 2021-01-22 ENCOUNTER — Other Ambulatory Visit: Payer: Self-pay | Admitting: *Deleted

## 2021-01-22 DIAGNOSIS — C61 Malignant neoplasm of prostate: Secondary | ICD-10-CM

## 2021-01-22 MED ORDER — ABIRATERONE ACETATE 250 MG PO TABS: ORAL_TABLET | ORAL | 10 refills | Status: DC

## 2021-01-22 NOTE — Telephone Encounter (Signed)
Received faxed refill request from [facility] for Zytiga   

## 2021-02-09 ENCOUNTER — Telehealth: Payer: Self-pay | Admitting: Pharmacy Technician

## 2021-02-09 NOTE — Telephone Encounter (Addendum)
Erroneous encounter

## 2021-02-18 ENCOUNTER — Encounter: Payer: Self-pay | Admitting: Nurse Practitioner

## 2021-02-26 ENCOUNTER — Other Ambulatory Visit: Payer: Self-pay | Admitting: Oncology

## 2021-03-05 ENCOUNTER — Inpatient Hospital Stay: Payer: Medicare Other | Admitting: Nurse Practitioner

## 2021-03-05 ENCOUNTER — Other Ambulatory Visit: Payer: Self-pay

## 2021-03-05 ENCOUNTER — Inpatient Hospital Stay: Payer: Medicare Other

## 2021-03-05 ENCOUNTER — Encounter: Payer: Self-pay | Admitting: Nurse Practitioner

## 2021-03-05 ENCOUNTER — Inpatient Hospital Stay (HOSPITAL_BASED_OUTPATIENT_CLINIC_OR_DEPARTMENT_OTHER): Payer: Medicare Other | Admitting: Nurse Practitioner

## 2021-03-05 ENCOUNTER — Ambulatory Visit: Payer: Medicare Other

## 2021-03-05 ENCOUNTER — Inpatient Hospital Stay: Payer: Medicare Other | Attending: Nurse Practitioner

## 2021-03-05 ENCOUNTER — Other Ambulatory Visit: Payer: Medicare Other

## 2021-03-05 VITALS — BP 141/72 | HR 92 | Temp 98.1°F | Resp 20 | Ht 61.0 in | Wt 145.8 lb

## 2021-03-05 DIAGNOSIS — Z5111 Encounter for antineoplastic chemotherapy: Secondary | ICD-10-CM | POA: Insufficient documentation

## 2021-03-05 DIAGNOSIS — Z79899 Other long term (current) drug therapy: Secondary | ICD-10-CM | POA: Insufficient documentation

## 2021-03-05 DIAGNOSIS — C7951 Secondary malignant neoplasm of bone: Secondary | ICD-10-CM | POA: Diagnosis present

## 2021-03-05 DIAGNOSIS — C61 Malignant neoplasm of prostate: Secondary | ICD-10-CM | POA: Diagnosis present

## 2021-03-05 LAB — CBC WITH DIFFERENTIAL (CANCER CENTER ONLY)
Abs Immature Granulocytes: 0.02 10*3/uL (ref 0.00–0.07)
Basophils Absolute: 0 10*3/uL (ref 0.0–0.1)
Basophils Relative: 0 %
Eosinophils Absolute: 0 10*3/uL (ref 0.0–0.5)
Eosinophils Relative: 1 %
HCT: 32.8 % — ABNORMAL LOW (ref 39.0–52.0)
Hemoglobin: 10.3 g/dL — ABNORMAL LOW (ref 13.0–17.0)
Immature Granulocytes: 0 %
Lymphocytes Relative: 12 %
Lymphs Abs: 1 10*3/uL (ref 0.7–4.0)
MCH: 29.9 pg (ref 26.0–34.0)
MCHC: 31.4 g/dL (ref 30.0–36.0)
MCV: 95.1 fL (ref 80.0–100.0)
Monocytes Absolute: 0.4 10*3/uL (ref 0.1–1.0)
Monocytes Relative: 5 %
Neutro Abs: 6.4 10*3/uL (ref 1.7–7.7)
Neutrophils Relative %: 82 %
Platelet Count: 281 10*3/uL (ref 150–400)
RBC: 3.45 MIL/uL — ABNORMAL LOW (ref 4.22–5.81)
RDW: 13.2 % (ref 11.5–15.5)
WBC Count: 7.8 10*3/uL (ref 4.0–10.5)
nRBC: 0 % (ref 0.0–0.2)

## 2021-03-05 LAB — CMP (CANCER CENTER ONLY)
ALT: 13 U/L (ref 0–44)
AST: 13 U/L — ABNORMAL LOW (ref 15–41)
Albumin: 4.5 g/dL (ref 3.5–5.0)
Alkaline Phosphatase: 57 U/L (ref 38–126)
Anion gap: 13 (ref 5–15)
BUN: 32 mg/dL — ABNORMAL HIGH (ref 8–23)
CO2: 25 mmol/L (ref 22–32)
Calcium: 9.9 mg/dL (ref 8.9–10.3)
Chloride: 104 mmol/L (ref 98–111)
Creatinine: 1.2 mg/dL (ref 0.61–1.24)
GFR, Estimated: >60 mL/min (ref >60)
Glucose, Bld: 130 mg/dL — ABNORMAL HIGH (ref 70–99)
Potassium: 3.6 mmol/L (ref 3.5–5.1)
Sodium: 142 mmol/L (ref 135–145)
Total Bilirubin: 0.5 mg/dL (ref 0.3–1.2)
Total Protein: 7.6 g/dL (ref 6.5–8.1)

## 2021-03-05 MED ORDER — HEPARIN SOD (PORK) LOCK FLUSH 100 UNIT/ML IV SOLN
250.0000 [IU] | Freq: Once | INTRAVENOUS | Status: DC | PRN
  Filled 2021-03-05: qty 5

## 2021-03-05 MED ORDER — SODIUM CHLORIDE 0.9% FLUSH
3.0000 mL | Freq: Once | INTRAVENOUS | Status: DC | PRN
  Filled 2021-03-05: qty 10

## 2021-03-05 MED ORDER — ZOLEDRONIC ACID 4 MG/100ML IV SOLN
4.0000 mg | Freq: Once | INTRAVENOUS | Status: AC
Start: 2021-03-05 — End: 2021-03-05
  Administered 2021-03-05: 4 mg via INTRAVENOUS
  Filled 2021-03-05: qty 100

## 2021-03-05 MED ORDER — SODIUM CHLORIDE 0.9 % IV SOLN
INTRAVENOUS | Status: DC
  Filled 2021-03-05: qty 250

## 2021-03-05 MED ORDER — HEPARIN SOD (PORK) LOCK FLUSH 100 UNIT/ML IV SOLN
500.0000 [IU] | Freq: Once | INTRAVENOUS | Status: DC | PRN
  Filled 2021-03-05: qty 5

## 2021-03-05 MED ORDER — SODIUM CHLORIDE 0.9% FLUSH
10.0000 mL | Freq: Once | INTRAVENOUS | Status: DC | PRN
  Filled 2021-03-05: qty 10

## 2021-03-05 MED ORDER — ALTEPLASE 2 MG IJ SOLR
2.0000 mg | Freq: Once | INTRAMUSCULAR | Status: DC | PRN
  Filled 2021-03-05: qty 2

## 2021-03-05 MED ORDER — LEUPROLIDE ACETATE (4 MONTH) 30 MG ~~LOC~~ KIT
30.0000 mg | PACK | Freq: Once | SUBCUTANEOUS | Status: AC
  Administered 2021-03-05: 30 mg via SUBCUTANEOUS
  Filled 2021-03-05: qty 30

## 2021-03-05 NOTE — Progress Notes (Signed)
[facility] OFFICE PROGRESS NOTE   Diagnosis: Prostate cancer, history of small bowel cancer  INTERVAL HISTORY:   [name] returns as scheduled.  He feels well.  No nausea or vomiting.  No diarrhea.  He has a good appetite.  He denies pain.  No fever, cough, shortness of breath.  Objective:  Vital signs in last 24 hours:  Blood pressure (!) 141/72, pulse 92, temperature 98.1 F (36.7 C), temperature source Oral, resp. rate 20, height 5' 1" (1.549 m), weight 145 lb 12.8 oz (66.1 kg), SpO2 99 %.    HEENT: No thrush or ulcers. Resp: Lungs clear bilaterally. Cardio: Regular rate and rhythm. GI: Abdomen soft and nontender.  No hepatomegaly. Vascular: No leg edema.   Lab Results:  Lab Results  Component Value Date   WBC 7.8 03/05/2021   HGB 10.3 (L) 03/05/2021   HCT 32.8 (L) 03/05/2021   MCV 95.1 03/05/2021   PLT 281 03/05/2021   NEUTROABS 6.4 03/05/2021    Imaging:  No results found.  Medications: I have reviewed the patient's current medications.  Assessment/Plan: 1. History of Severe anemia-status post a red cell transfusion 03/04/2016 2.. Prostate cancer Extensive sclerotic bone metastases noted on a CT of the abdomen/pelvis 03/04/2016 and MRI abdomen 03/05/2016 Markedly elevated PSA Lupron initiated 03/15/2016; Casodex 14 days beginning 03/15/2016 Prostate biopsy 03/18/2016 05/09/2016 PSA significantly improved at 21 Abiraterone initiated 05/10/2016 PSA less than 0.1 07/10/2018 PSA less than 0.1 08/30/2018 PSA less than 0.1 10/18/2018 PSA less than 0.1 11/29/2018 PSA less than 0.1 on 11/06/2019  PSA less than 0.1 on 03/05/2020 PSA less than 0.1 on 07/06/2020   3.   Symptoms of obstructive uropathy. Improved   4.   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 Benson Norway Referred to [facility] Resection of descending colon polyps 01/31/2018, tubulovillous adenomas with high-grade dysplasia Attempted endoscopic resection of the fourth segment duodenal "polyp" on 05/09/2018, firm unresectable lesion, biopsy revealed moderately differentiated adenocarcinoma CT abdomen/pelvis 05/25/2018- heterogeneously enhancing right lower pole renal lesion; diffuse sclerotic osseous metastatic lesions compatible with history of prostate cancer CT chest 05/25/2018- no pulmonary metastasis.  Diffuse osseous metastasis involving the axial and appendicular skeleton. 06/19/2018-sleeve resection of third and fourth portions of duodenum with primary end-to-end anastomosis, feeding gastrojejunostomy tube placement, Dr. Cloyd Stagers; poorly differentiated adenocarcinoma duodenum, 3 cm; tumor invades the muscularis propria; lymphovascular invasion present; negative margins; 4 of 11 involved lymph nodes; soft tissue deposits also seen in the mesentery; all mucosal margins and soft tissue margins negative but 2 of the positive nodes are found in the soft tissue margin submitted for frozen section (pT2 pN2); second mucosal lesion 1.2 cm from the proximal margin, tubular adenoma with focal severe dysplasia Intact expression of mismatch repair proteins by IHC; microsatellite stable Cycle 1 adjuvant Xeloda 07/23/2018 Cycle 2 adjuvant Xeloda 08/13/2018 Cycle 3 adjuvant Xeloda 09/10/2018 (dose reduced to 1000 mg twice daily for 14 days)  Cycle 4 adjuvant Xeloda 10/01/2018 (dose increased to 1500 mg every morning and 1000 mg every afternoon for 14 days) Cycle 5 adjuvant Xeloda 10/22/2018 Cycle 6 adjuvant Xeloda 11/12/2018 Cycle 7 adjuvant Xeloda 12/03/2018 Cycle 8 adjuvant Xeloda 12/24/2018   5.  Anorexia/weight loss- resolved    6.  Undescended testicles   7.  Right renal mass.  CT 08/29/2017-right renal mass involving the lower pole was partially calcified and does demonstrate enhancement following contrast most consistent with renal cell carcinoma.  Lesion not significantly enlarged compared with the prior CT. Followed by urology.   8.  Diabetes   9.  Anemia-stool Hemoccults + March 2021, referred to Dr. Benson Norway for endoscopic evaluation, anemia also secondary to metastatic prostate cancer, hypogonadism Small bowel enteroscopy 02/11/2020-esophagus normal.  Hematin found in the gastric body.  Examined duodenum normal.  No evidence of significant pathology in the entire examined portion of the jejunum.  There was no overt source of bleeding or inflammation.  The bleeding may be from small AVMs.  Surgical anastomosis was not visible with repeated inspection of the duodenum. Colonoscopy 02/11/2020-multiple sessile and semipedunculated polyps were found in the rectum, descending colon and ascending colon, 3 to 18 mm in size.  Polyps removed (polypectomy descending colon-fragments of tubular adenoma, inflammatory type polyp; polypectomy ascending colon-tubular adenoma, no high-grade dysplasia or malignancy; polypectomy descending colon-fragments of tubulovillous adenoma, no high-grade dysplasia or malignancy; polypectomy colon, rectum-tubular adenoma, no high-grade dysplasia or malignancy).      Disposition: [name] remains in clinical remission from prostate cancer and small bowel carcinoma.  He will continue abiraterone and Lupron, injection today.  Due for Zometa today as well.  We will follow-up on the PSA.    He will return for lab, follow-up, Zometa and Lupron in 4 months.    Ned Card ANP/GNP-BC   03/05/2021  11:13 AM

## 2021-03-05 NOTE — Addendum Note (Signed)
Addended by: Owens Shark on: 03/05/2021 12:22 PM   Modules accepted: Orders

## 2021-03-05 NOTE — Patient Instructions (Signed)
Zoledronic Acid Injection (Hypercalcemia, Oncology) What is this medication? ZOLEDRONIC ACID (ZOE le dron ik AS id) slows calcium loss from bones. It high calcium levels in the blood from some kinds of cancer. It may be used in otherpeople at risk for bone loss. This medicine may be used for other purposes; ask your health care provider orpharmacist if you have questions. COMMON BRAND NAME(S): Zometa What should I tell my care team before I take this medication? They need to know if you have any of these conditions: cancer dehydration dental disease kidney disease liver disease low levels of calcium in the blood lung or breathing disease (asthma) receiving steroids like dexamethasone or prednisone an unusual or allergic reaction to zoledronic acid, other medicines, foods, dyes, or preservatives pregnant or trying to get pregnant breast-feeding How should I use this medication? This drug is injected into a vein. It is given by a health care provider in ahospital or clinic setting. Talk to your health care provider about the use of this drug in children.Special care may be needed. Overdosage: If you think you have taken too much of this medicine contact apoison control center or emergency room at once. NOTE: This medicine is only for you. Do not share this medicine with others. What if I miss a dose? Keep appointments for follow-up doses. It is important not to miss your dose.Call your health care provider if you are unable to keep an appointment. What may interact with this medication? certain antibiotics given by injection NSAIDs, medicines for pain and inflammation, like ibuprofen or naproxen some diuretics like bumetanide, furosemide teriparatide thalidomide This list may not describe all possible interactions. Give your health care provider a list of all the medicines, herbs, non-prescription drugs, or dietary supplements you use. Also tell them if you smoke, drink alcohol, or use  illegaldrugs. Some items may interact with your medicine. What should I watch for while using this medication? Visit your health care provider for regular checks on your progress. It may besome time before you see the benefit from this drug. Some people who take this drug have severe bone, joint, or muscle pain. This drug may also increase your risk for jaw problems or a broken thigh bone. Tell your health care provider right away if you have severe pain in your jaw, bones, joints, or muscles. Tell you health care provider if you have any painthat does not go away or that gets worse. Tell your dentist and dental surgeon that you are taking this drug. You should not have major dental surgery while on this drug. See your dentist to have a dental exam and fix any dental problems before starting this drug. Take good care of your teeth while on this drug. Make sure you see your dentist forregular follow-up appointments. You should make sure you get enough calcium and vitamin D while you are taking this drug. Discuss the foods you eat and the vitamins you take with your healthcare provider. Check with your health care provider if you have severe diarrhea, nausea, and vomiting, or if you sweat a lot. The loss of too much body fluid may make itdangerous for you to take this drug. You may need blood work done while you are taking this drug. Do not become pregnant while taking this drug. Women should inform their health care provider if they wish to become pregnant or think they might be pregnant. There is potential for serious harm to an unborn child. Talk to your healthcare provider for more   information. What side effects may I notice from receiving this medication? Side effects that you should report to your doctor or health care provider assoon as possible: allergic reactions (skin rash, itching or hives; swelling of the face, lips, or tongue) bone pain infection (fever, chills, cough, sore throat, pain or  trouble passing urine) jaw pain, especially after dental work joint pain kidney injury (trouble passing urine or change in the amount of urine) low blood pressure (dizziness; feeling faint or lightheaded, falls; unusually weak or tired) low calcium levels (fast heartbeat; muscle cramps or pain; pain, tingling, or numbness in the hands or feet; seizures) low magnesium levels (fast, irregular heartbeat; muscle cramp or pain; muscle weakness; tremors; seizures) low red blood cell counts (trouble breathing; feeling faint; lightheaded, falls; unusually weak or tired) muscle pain redness, blistering, peeling, or loosening of the skin, including inside the mouth severe diarrhea swelling of the ankles, feet, hands trouble breathing Side effects that usually do not require medical attention (report to yourdoctor or health care provider if they continue or are bothersome): anxious constipation coughing depressed mood eye irritation, itching, or pain fever general ill feeling or flu-like symptoms nausea pain, redness, or irritation at site where injected trouble sleeping This list may not describe all possible side effects. Call your doctor for medical advice about side effects. You may report side effects to FDA at1-800-FDA-1088. Where should I keep my medication? This drug is given in a hospital or clinic. It will not be stored at home. NOTE: This sheet is a summary. It may not cover all possible information. If you have questions about this medicine, talk to your doctor, pharmacist, orhealth care provider.  2022 Elsevier/Gold Standard (2019-06-27 09:13:00)   Leuprolide depot injection What is this medication? LEUPROLIDE (loo PROE lide) is a man-made protein that acts like a natural hormone in the body. It decreases testosterone in men and decreases estrogen in women. In men, this medicine is used to treat advanced prostate cancer. In women, some forms of this medicine may be used to treat  endometriosis, uterinefibroids, or other male hormone-related problems. This medicine may be used for other purposes; ask your health care provider orpharmacist if you have questions. COMMON BRAND NAME(S): Eligard, Fensolv, Lupron Depot, Lupron Depot-Ped, Viadur What should I tell my care team before I take this medication? They need to know if you have any of these conditions: diabetes heart disease or previous heart attack high blood pressure high cholesterol mental illness osteoporosis pain or difficulty passing urine seizures spinal cord metastasis stroke suicidal thoughts, plans, or attempt; a previous suicide attempt by you or a family member tobacco smoker unusual vaginal bleeding (women) an unusual or allergic reaction to leuprolide, benzyl alcohol, other medicines, foods, dyes, or preservatives pregnant or trying to get pregnant breast-feeding How should I use this medication? This medicine is for injection into a muscle or for injection under the skin. It is given by a health care professional in a hospital or clinic setting. The specific product will determine how it will be given to you. Make sure youunderstand which product you receive and how often you will receive it. Talk to your pediatrician regarding the use of this medicine in children.Special care may be needed. Overdosage: If you think you have taken too much of this medicine contact apoison control center or emergency room at once. NOTE: This medicine is only for you. Do not share this medicine with others. What if I miss a dose? It is important not to  miss a dose. Call your doctor or health careprofessional if you are unable to keep an appointment. Depot injections: Depot injections are given either once-monthly, every 12 weeks, every 16 weeks, or every 24 weeks depending on the product you are prescribed. The product you are prescribed will be based on if you are male orfemale, and your condition. Make sure you  understand your product and dosing. What may interact with this medication? Do not take this medicine with any of the following medications: chasteberry cisapride dronedarone pimozide thioridazine This medicine may also interact with the following medications: herbal or dietary supplements, like black cohosh or DHEA male hormones, like estrogens or progestins and birth control pills, patches, rings, or injections male hormones, like testosterone other medicines that prolong the QT interval (abnormal heart rhythm) This list may not describe all possible interactions. Give your health care provider a list of all the medicines, herbs, non-prescription drugs, or dietary supplements you use. Also tell them if you smoke, drink alcohol, or use illegaldrugs. Some items may interact with your medicine. What should I watch for while using this medication? Visit your doctor or health care professional for regular checks on your progress. During the first weeks of treatment, your symptoms may get worse, but then will improve as you continue your treatment. You may get hot flashes, increased bone pain, increased difficulty passing urine, or an aggravation of nerve symptoms. Discuss these effects with your doctor or health careprofessional, some of them may improve with continued use of this medicine. Male patients may experience a menstrual cycle or spotting during the first months of therapy with this medicine. If this continues, contact your doctor orhealth care professional. This medicine may increase blood sugar. Ask your healthcare provider if changesin diet or medicines are needed if you have diabetes. What side effects may I notice from receiving this medication? Side effects that you should report to your doctor or health care professionalas soon as possible: allergic reactions like skin rash, itching or hives, swelling of the face, lips, or tongue breathing problems chest pain depression or  memory disorders pain in your legs or groin pain at site where injected or implanted seizures severe headache signs and symptoms of high blood sugar such as being more thirsty or hungry or having to urinate more than normal. You may also feel very tired or have blurry vision swelling of the feet and legs suicidal thoughts or other mood changes visual changes vomiting Side effects that usually do not require medical attention (report to yourdoctor or health care professional if they continue or are bothersome): breast swelling or tenderness decrease in sex drive or performance diarrhea hot flashes loss of appetite muscle, joint, or bone pains nausea redness or irritation at site where injected or implanted skin problems or acne This list may not describe all possible side effects. Call your doctor for medical advice about side effects. You may report side effects to FDA at1-800-FDA-1088. Where should I keep my medication? This drug is given in a hospital or clinic and will not be stored at home. NOTE: This sheet is a summary. It may not cover all possible information. If you have questions about this medicine, talk to your doctor, pharmacist, orhealth care provider.  2022 Elsevier/Gold Standard (2019-08-14 10:35:13)

## 2021-03-06 LAB — PROSTATE-SPECIFIC AG, SERUM (LABCORP): Prostate Specific Ag, Serum: <0.1 ng/mL (ref 0.0–4.0)

## 2021-03-11 ENCOUNTER — Encounter: Payer: Self-pay | Admitting: Nurse Practitioner

## 2021-03-15 ENCOUNTER — Other Ambulatory Visit: Payer: Self-pay | Admitting: Oncology

## 2021-03-15 ENCOUNTER — Telehealth: Payer: Self-pay | Admitting: Pharmacy Technician

## 2021-03-15 DIAGNOSIS — C61 Malignant neoplasm of prostate: Secondary | ICD-10-CM

## 2021-03-15 DIAGNOSIS — Z85068 Personal history of other malignant neoplasm of small intestine: Secondary | ICD-10-CM

## 2021-03-15 DIAGNOSIS — C179 Malignant neoplasm of small intestine, unspecified: Secondary | ICD-10-CM

## 2021-03-15 NOTE — Telephone Encounter (Signed)
Oral Oncology Patient Advocate Encounter  Patient completed re-enrollment application for [employer] in an effort to reduce patient's out of pocket expense for Zytiga to $0.    Application completed and faxed to [phone].   [employer] patient assistance phone number for follow up is [phone].   This encounter will be updated until final determination.  [place] Patient [place] Phone [phone] Fax [phone] 03/15/2021 9:47 AM

## 2021-03-25 NOTE — Telephone Encounter (Addendum)
Oral Oncology Patient Advocate Encounter  Received notification from [employer] ([employer]) program that patient has been enrolled into their program to receive Zytiga from the manufacturer at $0 out of pocket until 09/25/21.    I will call and let the patient know of the approval and to sign the Medicare D attestation and that he will need to re-apply.   Specialty Pharmacy that will dispense medication is [facility].  Patient knows to call the office with questions or concerns.   Oral Oncology Clinic will continue to follow.  [place] [facility] [place] Phone [phone] Fax [phone] 03/25/2021 9:56 AM

## 2021-05-29 ENCOUNTER — Other Ambulatory Visit: Payer: Self-pay | Admitting: Nurse Practitioner

## 2021-06-13 ENCOUNTER — Other Ambulatory Visit: Payer: Self-pay | Admitting: Oncology

## 2021-06-13 DIAGNOSIS — Z85068 Personal history of other malignant neoplasm of small intestine: Secondary | ICD-10-CM

## 2021-06-13 DIAGNOSIS — C179 Malignant neoplasm of small intestine, unspecified: Secondary | ICD-10-CM

## 2021-06-13 DIAGNOSIS — C61 Malignant neoplasm of prostate: Secondary | ICD-10-CM

## 2021-06-14 ENCOUNTER — Encounter: Payer: Self-pay | Admitting: Oncology

## 2021-06-16 ENCOUNTER — Other Ambulatory Visit (HOSPITAL_COMMUNITY): Payer: Self-pay

## 2021-06-16 ENCOUNTER — Encounter: Payer: Self-pay | Admitting: Oncology

## 2021-07-05 ENCOUNTER — Other Ambulatory Visit: Payer: Self-pay

## 2021-07-05 ENCOUNTER — Encounter: Payer: Self-pay | Admitting: Nurse Practitioner

## 2021-07-05 ENCOUNTER — Ambulatory Visit: Payer: Medicare Other

## 2021-07-05 ENCOUNTER — Ambulatory Visit: Payer: Medicare Other | Admitting: Oncology

## 2021-07-05 ENCOUNTER — Inpatient Hospital Stay: Payer: Medicare Other

## 2021-07-05 ENCOUNTER — Other Ambulatory Visit: Payer: Medicare Other

## 2021-07-05 ENCOUNTER — Inpatient Hospital Stay: Payer: Medicare Other | Attending: Oncology

## 2021-07-05 ENCOUNTER — Inpatient Hospital Stay: Payer: Medicare Other | Admitting: Nurse Practitioner

## 2021-07-05 VITALS — BP 118/65 | HR 95 | Temp 98.1°F | Resp 18 | Ht 61.0 in | Wt 147.6 lb

## 2021-07-05 DIAGNOSIS — Z79899 Other long term (current) drug therapy: Secondary | ICD-10-CM | POA: Diagnosis not present

## 2021-07-05 DIAGNOSIS — Z85068 Personal history of other malignant neoplasm of small intestine: Secondary | ICD-10-CM | POA: Diagnosis not present

## 2021-07-05 DIAGNOSIS — Z86010 Personal history of colonic polyps: Secondary | ICD-10-CM | POA: Insufficient documentation

## 2021-07-05 DIAGNOSIS — E119 Type 2 diabetes mellitus without complications: Secondary | ICD-10-CM | POA: Diagnosis not present

## 2021-07-05 DIAGNOSIS — Q539 Undescended testicle, unspecified: Secondary | ICD-10-CM | POA: Diagnosis not present

## 2021-07-05 DIAGNOSIS — C179 Malignant neoplasm of small intestine, unspecified: Secondary | ICD-10-CM

## 2021-07-05 DIAGNOSIS — D649 Anemia, unspecified: Secondary | ICD-10-CM | POA: Diagnosis not present

## 2021-07-05 DIAGNOSIS — E291 Testicular hypofunction: Secondary | ICD-10-CM | POA: Diagnosis not present

## 2021-07-05 DIAGNOSIS — C61 Malignant neoplasm of prostate: Secondary | ICD-10-CM

## 2021-07-05 DIAGNOSIS — Z5111 Encounter for antineoplastic chemotherapy: Secondary | ICD-10-CM | POA: Insufficient documentation

## 2021-07-05 DIAGNOSIS — Z23 Encounter for immunization: Secondary | ICD-10-CM | POA: Insufficient documentation

## 2021-07-05 DIAGNOSIS — C7951 Secondary malignant neoplasm of bone: Secondary | ICD-10-CM | POA: Insufficient documentation

## 2021-07-05 LAB — FERRITIN: Ferritin: 68 ng/mL (ref 24–336)

## 2021-07-05 LAB — CMP (CANCER CENTER ONLY)
ALT: 10 U/L (ref 0–44)
AST: 13 U/L — ABNORMAL LOW (ref 15–41)
Albumin: 4.4 g/dL (ref 3.5–5.0)
Alkaline Phosphatase: 41 U/L (ref 38–126)
Anion gap: 11 (ref 5–15)
BUN: 45 mg/dL — ABNORMAL HIGH (ref 8–23)
CO2: 24 mmol/L (ref 22–32)
Calcium: 10 mg/dL (ref 8.9–10.3)
Chloride: 104 mmol/L (ref 98–111)
Creatinine: 1.92 mg/dL — ABNORMAL HIGH (ref 0.61–1.24)
GFR, Estimated: 39 mL/min — ABNORMAL LOW (ref >60)
Glucose, Bld: 110 mg/dL — ABNORMAL HIGH (ref 70–99)
Potassium: 3.8 mmol/L (ref 3.5–5.1)
Sodium: 139 mmol/L (ref 135–145)
Total Bilirubin: 0.4 mg/dL (ref 0.3–1.2)
Total Protein: 7.4 g/dL (ref 6.5–8.1)

## 2021-07-05 LAB — CBC WITH DIFFERENTIAL (CANCER CENTER ONLY)
Abs Immature Granulocytes: 0.02 10*3/uL (ref 0.00–0.07)
Basophils Absolute: 0 10*3/uL (ref 0.0–0.1)
Basophils Relative: 0 %
Eosinophils Absolute: 0.1 10*3/uL (ref 0.0–0.5)
Eosinophils Relative: 1 %
HCT: 25.9 % — ABNORMAL LOW (ref 39.0–52.0)
Hemoglobin: 8 g/dL — ABNORMAL LOW (ref 13.0–17.0)
Immature Granulocytes: 0 %
Lymphocytes Relative: 13 %
Lymphs Abs: 0.9 10*3/uL (ref 0.7–4.0)
MCH: 29.9 pg (ref 26.0–34.0)
MCHC: 30.9 g/dL (ref 30.0–36.0)
MCV: 96.6 fL (ref 80.0–100.0)
Monocytes Absolute: 0.5 10*3/uL (ref 0.1–1.0)
Monocytes Relative: 7 %
Neutro Abs: 5.6 10*3/uL (ref 1.7–7.7)
Neutrophils Relative %: 79 %
Platelet Count: 267 10*3/uL (ref 150–400)
RBC: 2.68 MIL/uL — ABNORMAL LOW (ref 4.22–5.81)
RDW: 13.8 % (ref 11.5–15.5)
WBC Count: 7 10*3/uL (ref 4.0–10.5)
nRBC: 0 % (ref 0.0–0.2)

## 2021-07-05 MED ORDER — INFLUENZA VAC SPLIT QUAD 0.5 ML IM SUSY
0.5000 mL | PREFILLED_SYRINGE | Freq: Once | INTRAMUSCULAR | Status: AC
  Administered 2021-07-05: 0.5 mL via INTRAMUSCULAR

## 2021-07-05 MED ORDER — LEUPROLIDE ACETATE (4 MONTH) 30 MG ~~LOC~~ KIT
30.0000 mg | PACK | Freq: Once | SUBCUTANEOUS | Status: AC
  Administered 2021-07-05: 30 mg via SUBCUTANEOUS
  Filled 2021-07-05: qty 30

## 2021-07-05 NOTE — Patient Instructions (Signed)
[facility]  Discharge Instructions: Thank you for choosing [facility] to provide your oncology and hematology care.   If you have a lab appointment with the [facility], please go directly to the [facility] and check in at the registration area.   Wear comfortable clothing and clothing appropriate for easy access to any Portacath or PICC line.   We strive to give you quality time with your provider. You may need to reschedule your appointment if you arrive late (15 or more minutes).  Arriving late affects you and other patients whose appointments are after yours.  Also, if you miss three or more appointments without notifying the office, you may be dismissed from the clinic at the provider's discretion.      For prescription refill requests, have your pharmacy contact our office and allow 72 hours for refills to be completed.    Today you received the following chemotherapy and/or immunotherapy agents LUPRON and FLU SHOT      To help prevent nausea and vomiting after your treatment, we encourage you to take your nausea medication as directed.  BELOW ARE SYMPTOMS THAT SHOULD BE REPORTED IMMEDIATELY: *FEVER GREATER THAN 100.4 F (38 C) OR HIGHER *CHILLS OR SWEATING *NAUSEA AND VOMITING THAT IS NOT CONTROLLED WITH YOUR NAUSEA MEDICATION *UNUSUAL SHORTNESS OF BREATH *UNUSUAL BRUISING OR BLEEDING *URINARY PROBLEMS (pain or burning when urinating, or frequent urination) *BOWEL PROBLEMS (unusual diarrhea, constipation, pain near the anus) TENDERNESS IN MOUTH AND THROAT WITH OR WITHOUT PRESENCE OF ULCERS (sore throat, sores in mouth, or a toothache) UNUSUAL RASH, SWELLING OR PAIN  UNUSUAL VAGINAL DISCHARGE OR ITCHING   Items with * indicate a potential emergency and should be followed up as soon as possible or go to the Emergency Department if any problems should occur.  Please show the CHEMOTHERAPY ALERT CARD or IMMUNOTHERAPY ALERT CARD at  check-in to the Emergency Department and triage nurse.  Should you have questions after your visit or need to cancel or reschedule your appointment, please contact [facility]  Dept: [phone]  and follow the prompts.  Office hours are 8:00 a.m. to 4:30 p.m. Monday - Friday. Please note that voicemails left after 4:00 p.m. may not be returned until the following business day.  We are closed weekends and major holidays. You have access to a nurse at all times for urgent questions. Please call the main number to the clinic Dept: [phone] and follow the prompts.   For any non-urgent questions, you may also contact your provider using MyChart. We now offer e-Visits for anyone 23 and older to request care online for non-urgent symptoms. For details visit [email or web address].   Also download the MyChart app! Go to the app store, search "MyChart", open the app, select [facility], and log in with your MyChart username and password.  Due to Covid, a mask is required upon entering the hospital/clinic. If you do not have a mask, one will be given to you upon arrival. For doctor visits, patients may have 1 support person aged 20 or older with them. For treatment visits, patients cannot have anyone with them due to current Covid guidelines and our immunocompromised population.   Leuprolide injection What is this medication? LEUPROLIDE (loo PROE lide) is a man-made hormone. It is used to treat the symptoms of prostate cancer. This medicine may also be used to treat children with early onset of puberty. It may be used for other hormonal conditions.  This medicine may be used for other purposes; ask your health care provider or pharmacist if you have questions. COMMON BRAND NAME(S): Lupron What should I tell my care team before I take this medication? They need to know if you have any of these conditions: diabetes heart disease or previous heart attack high blood  pressure high cholesterol pain or difficulty passing urine spinal cord metastasis stroke tobacco smoker an unusual or allergic reaction to leuprolide, benzyl alcohol, other medicines, foods, dyes, or preservatives pregnant or trying to get pregnant breast-feeding How should I use this medication? This medicine is for injection under the skin or into a muscle. You will be taught how to prepare and give this medicine. Use exactly as directed. Take your medicine at regular intervals. Do not take your medicine more often than directed. It is important that you put your used needles and syringes in a special sharps container. Do not put them in a trash can. If you do not have a sharps container, call your pharmacist or healthcare provider to get one. A special MedGuide will be given to you by the pharmacist with each prescription and refill. Be sure to read this information carefully each time. Talk to your pediatrician regarding the use of this medicine in children. While this medicine may be prescribed for children as young as 8 years for selected conditions, precautions do apply. Overdosage: If you think you have taken too much of this medicine contact a poison control center or emergency room at once. NOTE: This medicine is only for you. Do not share this medicine with others. What if I miss a dose? If you miss a dose, take it as soon as you can. If it is almost time for your next dose, take only that dose. Do not take double or extra doses. What may interact with this medication? Do not take this medicine with any of the following medications: chasteberry cisapride dronedarone pimozide thioridazine This medicine may also interact with the following medications: herbal or dietary supplements, like black cohosh or DHEA male hormones, like estrogens or progestins and birth control pills, patches, rings, or injections male hormones, like testosterone other medicines that prolong the QT  interval (abnormal heart rhythm) This list may not describe all possible interactions. Give your health care provider a list of all the medicines, herbs, non-prescription drugs, or dietary supplements you use. Also tell them if you smoke, drink alcohol, or use illegal drugs. Some items may interact with your medicine. What should I watch for while using this medication? Visit your doctor or health care professional for regular checks on your progress. During the first week, your symptoms may get worse, but then will improve as you continue your treatment. You may get hot flashes, increased bone pain, increased difficulty passing urine, or an aggravation of nerve symptoms. Discuss these effects with your doctor or health care professional, some of them may improve with continued use of this medicine. Male patients may experience a menstrual cycle or spotting during the first 2 months of therapy with this medicine. If this continues, contact your doctor or health care professional. This medicine may increase blood sugar. Ask your healthcare provider if changes in diet or medicines are needed if you have diabetes. What side effects may I notice from receiving this medication? Side effects that you should report to your doctor or health care professional as soon as possible: allergic reactions like skin rash, itching or hives, swelling of the face, lips, or tongue breathing problems  chest pain depression or memory disorders pain in your legs or groin pain at site where injected severe headache signs and symptoms of high blood sugar such as being more thirsty or hungry or having to urinate more than normal. You may also feel very tired or have blurry vision swelling of the feet and legs visual changes vomiting Side effects that usually do not require medical attention (report to your doctor or health care professional if they continue or are bothersome): breast swelling or tenderness decrease in  sex drive or performance diarrhea hot flashes loss of appetite muscle, joint, or bone pains nausea redness or irritation at site where injected skin problems or acne This list may not describe all possible side effects. Call your doctor for medical advice about side effects. You may report side effects to FDA at 1-800-FDA-1088. Where should I keep my medication? Keep out of the reach of children. Store below 25 degrees C (77 degrees F). Do not freeze. Protect from light. Do not use if it is not clear or if there are particles present. Throw away any unused medicine after the expiration date. NOTE: This sheet is a summary. It may not cover all possible information. If you have questions about this medicine, talk to your doctor, pharmacist, or health care provider.  2022 Elsevier/Gold Standard (2019-08-14 10:57:41)  Preventing Influenza, Adult Influenza, also known as the flu, is an infection caused by a virus. The flu mainly affects the nose, throat, and lungs (respiratory system). This infection causes common cold symptoms, as well as a high fever and body aches. The flu spreads easily from person to person (is contagious). The flu is most common from December through March. This period of time is called flu season. You can catch the flu virus by: Breathing in droplets from an infected person's cough or sneeze. Touching something that recently got the virus on it (is contaminated) and then touching your mouth, nose, or eyes. How can this condition affect me? If you get the flu, your friends, family, and co-workers are also at risk of getting it because the flu spreads easily to others. Having the flu can lead to complications, such as pneumonia, ear infection, and sinus infection. The flu also can be life-threatening, especially for babies, people older than age 22, and people who have serious long-term diseases. You can protect yourself and other people by: Keeping your body's defense  system (immune system) strong. Getting a flu shot, or influenza vaccination, each year. Practicing good health habits. What can increase my risk? The following factors may make you more likely to get the flu: Not washing or sanitizing your hands often. Having close contact with many people during cold and flu season. Touching your mouth, eyes, or nose without first washing or sanitizing your hands. Not getting a flu shot each year. Working in health care. You may be at risk for more serious flu symptoms if: You are older than age 7. You are pregnant. Your immune system is weak. You have a condition that makes the flu worse or life-threatening. You have severe obesity. What actions can I take to prevent this? Lifestyle You can lower your risk of getting the flu by keeping your immune system in good shape. Do this by: Eating a healthy diet. Drinking plenty of fluids. Drink enough fluid to keep your urine pale yellow. Getting enough sleep. Exercising regularly. Do not use any products that contain nicotine or tobacco. These products include cigarettes, chewing tobacco, and vaping devices, such  as e-cigarettes. If you need help quitting, ask your health care provider. Medicines Getting a flu shot every year can lower your risk of getting the flu. This is the best way to prevent the flu. A flu shot is recommended for everyone age 46 months or older. It is best to get a flu shot in the fall, as soon as it is available. But getting a flu shot during winter or spring instead is still a good idea. Flu season can last into early spring. Preventing the flu through vaccination means getting a new flu shot every year. This is because the flu virus changes slightly (mutates) from one year to the next. The flu shot does not completely protect you from all flu virus mutations. If you get the flu after you get the shot, the shot can make your illness milder and go away sooner. It also can prevent dangerous  complications of the flu. If you are pregnant, you can and should get a flu shot. Check with your health care provider before getting a flu shot if you have had a reaction to the shot in the past or if you are allergic to eggs. Sometimes the vaccine is available as a nasal spray. In some years, the nasal spray has not been as effective against the flu virus. Check with your health care provider if you have questions about this. Most people recover from the flu by resting at home and drinking plenty of fluids. However, a prescription antiviral medicine may reduce your flu symptoms and may make your flu go away sooner. This medicine must be started within a few days of getting flu symptoms. Antiviral medicine may be prescribed for people who are at risk for more serious flu symptoms. Talk with your health care provider about whether you need an antiviral medicine.  General information   You can also lower your risk of getting the flu by practicing good health habits. This is especially important during flu season. Avoid contact with people who are sick with flu or cold symptoms. Wash your hands with soap and water often and for at least 20 seconds. If soap and water are not available, use alcohol-based hand sanitizer. Avoid touching your hands to your face, especially when you have not washed your hands recently. Use a cleanser that kills germs (a disinfectant) to clean surfaces at home and at work that may have the flu virus on them. If you do get the flu, avoid spreading it to others by: Staying home until your symptoms, including your fever, have been gone for at least 24 hours. Covering your mouth and nose when you cough or sneeze. Avoiding close contact with others, especially babies and older people.  Where to find more information Centers for Disease Control and Prevention: http://www.wolf.info/ American Academy of Family Physicians: www.familydoctor.org Contact a health care provider if: You have  the flu and you develop new symptoms. You have diarrhea. You have a fever. Your cough gets worse, or you produce more mucus. Get help right away if you: Have trouble breathing. Have chest pain. These symptoms may represent a serious problem that is an emergency. Do not wait to see if the symptoms will go away. Get medical help right away. Call your local emergency services (911 in the [place] Do not drive yourself to the hospital. Summary The best way to prevent the flu is to get a flu shot (influenza vaccination) every year in the fall. Even if you get the flu after you have  received the yearly flu shot, your flu may be milder and go away sooner because of your flu shot. If you get the flu, antiviral medicines that are started within a few days of flu symptoms may reduce your symptoms and reduce how long the flu lasts. You can also help prevent the flu by practicing good health habits. This information is not intended to replace advice given to you by your health care provider. Make sure you discuss any questions you have with your health care provider. Document Revised: 05/01/2020 Document Reviewed: 05/01/2020 Elsevier Patient Education  [place].

## 2021-07-05 NOTE — Progress Notes (Signed)
[facility] OFFICE PROGRESS NOTE   Diagnosis: Prostate cancer, history of small bowel cancer  INTERVAL HISTORY:   [name] returns as scheduled.  He continues abiraterone.  He receives Lupron and Zometa every 4 months.  Most recent PSA from 03/05/2021 less than 0.1.  He feels well.  No nausea or vomiting.  No mouth sores.  No gum or jaw pain.  He denies abdominal pain.  He has a good appetite.  Stable mild dyspnea on exertion.  No fever or cough.  He is not aware of any bleeding.  Objective:  Vital signs in last 24 hours:  Blood pressure 118/65, pulse 95, temperature 98.1 F (36.7 C), temperature source Oral, resp. rate 18, height _0  (1.549 m), weight 147 lb 9.6 oz (67 kg), SpO2 100 %.    HEENT: No thrush or ulcers. Resp: Lungs clear bilaterally. Cardio: Regular rate and rhythm. GI: No hepatosplenomegaly. Vascular: No leg edema. Neuro: Alert and oriented.   Lab Results:  Lab Results  Component Value Date   WBC 7.0 07/05/2021   HGB 8.0 (L) 07/05/2021   HCT 25.9 (L) 07/05/2021   MCV 96.6 07/05/2021   PLT 267 07/05/2021   NEUTROABS 5.6 07/05/2021    Imaging:  No results found.  Medications: I have reviewed the patient's current medications.  Assessment/Plan: 1. History of Severe anemia-status post a red cell transfusion 03/04/2016 2.. Prostate cancer Extensive sclerotic bone metastases noted on a CT of the abdomen/pelvis 03/04/2016 and MRI abdomen 03/05/2016 Markedly elevated PSA Lupron initiated 03/15/2016; Casodex 14 days beginning 03/15/2016 Prostate biopsy 03/18/2016 05/09/2016 PSA significantly improved at 21 Abiraterone initiated 05/10/2016 PSA less than 0.1 07/10/2018 PSA less than 0.1 08/30/2018 PSA less than 0.1 10/18/2018 PSA less than 0.1 11/29/2018 PSA less than 0.1 on 11/06/2019  PSA less than 0.1 on 03/05/2020 PSA less than 0.1 on 07/06/2020   3.   Symptoms of obstructive uropathy. Improved   4.   Distal duodenal and  descending colon masses biopsy of the duodenal mass 03/04/2016 confirmed fragments of small bowel mucosa with focal high-grade dysplasia Biopsy of the descending colon "polyps" revealed fragments of a tubulovillous adenoma CT 08/29/2017-similar soft tissue thickening in the region of the splenic flexure of the colon suboptimally evaluated due to lack of colonic enteric contrast Referred to Dr. Benson Norway Referred to [facility] Resection of descending colon polyps 01/31/2018, tubulovillous adenomas with high-grade dysplasia Attempted endoscopic resection of the fourth segment duodenal "polyp" on 05/09/2018, firm unresectable lesion, biopsy revealed moderately differentiated adenocarcinoma CT abdomen/pelvis 05/25/2018- heterogeneously enhancing right lower pole renal lesion; diffuse sclerotic osseous metastatic lesions compatible with history of prostate cancer CT chest 05/25/2018- no pulmonary metastasis.  Diffuse osseous metastasis involving the axial and appendicular skeleton. 06/19/2018-sleeve resection of third and fourth portions of duodenum with primary end-to-end anastomosis, feeding gastrojejunostomy tube placement, Dr. Cloyd Stagers; poorly differentiated adenocarcinoma duodenum, 3 cm; tumor invades the muscularis propria; lymphovascular invasion present; negative margins; 4 of 11 involved lymph nodes; soft tissue deposits also seen in the mesentery; all mucosal margins and soft tissue margins negative but 2 of the positive nodes are found in the soft tissue margin submitted for frozen section (pT2 pN2); second mucosal lesion 1.2 cm from the proximal margin, tubular adenoma with focal severe dysplasia Intact expression of mismatch repair proteins by IHC; microsatellite stable Cycle 1 adjuvant Xeloda 07/23/2018 Cycle 2 adjuvant Xeloda 08/13/2018 Cycle 3 adjuvant Xeloda 09/10/2018 (dose reduced to 1000 mg twice daily for 14 days)  Cycle 4 adjuvant Xeloda  10/01/2018 (dose increased to 1500 mg every morning and 1000 mg every  afternoon for 14 days) Cycle 5 adjuvant Xeloda 10/22/2018 Cycle 6 adjuvant Xeloda 11/12/2018 Cycle 7 adjuvant Xeloda 12/03/2018 Cycle 8 adjuvant Xeloda 12/24/2018   5.  Anorexia/weight loss- resolved   6.  Undescended testicles   7.  Right renal mass.  CT 08/29/2017-right renal mass involving the lower pole was partially calcified and does demonstrate enhancement following contrast most consistent with renal cell carcinoma.  Lesion not significantly enlarged compared with the prior CT. Followed by urology.   8.  Diabetes   9.  Anemia-stool Hemoccults + March 2021, referred to Dr. Benson Norway for endoscopic evaluation, anemia also secondary to metastatic prostate cancer, hypogonadism Small bowel enteroscopy 02/11/2020-esophagus normal.  Hematin found in the gastric body.  Examined duodenum normal.  No evidence of significant pathology in the entire examined portion of the jejunum.  There was no overt source of bleeding or inflammation.  The bleeding may be from small AVMs.  Surgical anastomosis was not visible with repeated inspection of the duodenum. Colonoscopy 02/11/2020-multiple sessile and semipedunculated polyps were found in the rectum, descending colon and ascending colon, 3 to 18 mm in size.  Polyps removed (polypectomy descending colon-fragments of tubular adenoma, inflammatory type polyp; polypectomy ascending colon-tubular adenoma, no high-grade dysplasia or malignancy; polypectomy descending colon-fragments of tubulovillous adenoma, no high-grade dysplasia or malignancy; polypectomy colon, rectum-tubular adenoma, no high-grade dysplasia or malignancy).      Disposition: Brandon Peters appears unchanged.  He continues abiraterone.  Lupron injection today.  We will follow-up on the PSA from today.  We will hold Zometa due to the elevated creatinine.  We reviewed the CBC and chemistry panel from today.  Hemoglobin and creatinine with unexpected changes.  He is more anemic.  He is not aware of bleeding.   We are adding a ferritin and he will complete stool cards.  Creatinine is elevated.  We are referring him for a renal ultrasound.  He will return for follow-up, repeat labs in 2 weeks.  We are available to see him sooner if needed.  Plan reviewed with Dr. Benay Spice.    Brandon Peters ANP/GNP-BC   07/05/2021  10:38 AM

## 2021-07-06 ENCOUNTER — Ambulatory Visit (HOSPITAL_BASED_OUTPATIENT_CLINIC_OR_DEPARTMENT_OTHER)
Admission: RE | Admit: 2021-07-06 | Discharge: 2021-07-06 | Disposition: A | Discharge status: Home / Self Care | Payer: Medicare Other | Source: Ambulatory Visit | Attending: Nurse Practitioner | Admitting: Nurse Practitioner

## 2021-07-06 DIAGNOSIS — Z5111 Encounter for antineoplastic chemotherapy: Secondary | ICD-10-CM | POA: Diagnosis not present

## 2021-07-06 DIAGNOSIS — C179 Malignant neoplasm of small intestine, unspecified: Secondary | ICD-10-CM | POA: Insufficient documentation

## 2021-07-06 DIAGNOSIS — C61 Malignant neoplasm of prostate: Secondary | ICD-10-CM | POA: Diagnosis not present

## 2021-07-06 LAB — PROSTATE-SPECIFIC AG, SERUM (LABCORP): Prostate Specific Ag, Serum: <0.1 ng/mL (ref 0.0–4.0)

## 2021-07-08 ENCOUNTER — Telehealth: Payer: Self-pay | Admitting: Nurse Practitioner

## 2021-07-08 DIAGNOSIS — Z5111 Encounter for antineoplastic chemotherapy: Secondary | ICD-10-CM | POA: Diagnosis not present

## 2021-07-08 NOTE — Telephone Encounter (Signed)
I contacted [name] regarding the renal ultrasound results-no hydronephrosis, right kidney mass larger.  He has not completed the stool cards yet.  He will return the cards to the office once he completes.  Recommend he hold Cozaar and Maxide for now due to the elevated creatinine.  He will follow-up in the office as scheduled.

## 2021-07-10 DIAGNOSIS — Z5111 Encounter for antineoplastic chemotherapy: Secondary | ICD-10-CM | POA: Diagnosis not present

## 2021-07-11 ENCOUNTER — Other Ambulatory Visit: Payer: Self-pay | Admitting: Oncology

## 2021-07-11 DIAGNOSIS — C179 Malignant neoplasm of small intestine, unspecified: Secondary | ICD-10-CM

## 2021-07-11 DIAGNOSIS — Z85068 Personal history of other malignant neoplasm of small intestine: Secondary | ICD-10-CM

## 2021-07-11 DIAGNOSIS — C61 Malignant neoplasm of prostate: Secondary | ICD-10-CM

## 2021-07-12 ENCOUNTER — Other Ambulatory Visit (HOSPITAL_BASED_OUTPATIENT_CLINIC_OR_DEPARTMENT_OTHER): Payer: Self-pay

## 2021-07-12 ENCOUNTER — Encounter: Payer: Self-pay | Admitting: Licensed Clinical Social Worker

## 2021-07-12 ENCOUNTER — Encounter: Payer: Self-pay | Admitting: Nurse Practitioner

## 2021-07-12 DIAGNOSIS — C61 Malignant neoplasm of prostate: Secondary | ICD-10-CM

## 2021-07-12 DIAGNOSIS — C179 Malignant neoplasm of small intestine, unspecified: Secondary | ICD-10-CM

## 2021-07-12 LAB — OCCULT BLOOD X 1 CARD TO LAB, STOOL
Fecal Occult Bld: POSITIVE — AB
Fecal Occult Bld: POSITIVE — AB
Fecal Occult Bld: POSITIVE — AB

## 2021-07-12 NOTE — Progress Notes (Signed)
UPDATE: VUS identified in DICER1 [id number] ([id number]) has been reclassified to "Likely Benign." The report date is 07/08/2021.

## 2021-07-13 ENCOUNTER — Other Ambulatory Visit: Payer: Self-pay | Admitting: Nurse Practitioner

## 2021-07-13 DIAGNOSIS — Z85068 Personal history of other malignant neoplasm of small intestine: Secondary | ICD-10-CM

## 2021-07-13 DIAGNOSIS — C61 Malignant neoplasm of prostate: Secondary | ICD-10-CM

## 2021-07-13 DIAGNOSIS — C179 Malignant neoplasm of small intestine, unspecified: Secondary | ICD-10-CM

## 2021-07-13 MED ORDER — POTASSIUM CHLORIDE CRYS ER 20 MEQ PO TBCR: 20.0000 meq | EXTENDED_RELEASE_TABLET | Freq: Every day | ORAL | 0 refills | Status: DC

## 2021-07-14 ENCOUNTER — Telehealth: Payer: Self-pay | Admitting: Pharmacy Technician

## 2021-07-16 NOTE — Telephone Encounter (Signed)
Oral Oncology Patient Advocate Encounter  Was successful in securing patient a $8,000 grant from [employer] to provide copayment coverage for Zytiga.  This will keep the out of pocket expense at $0.     [facility] ID: [id number]   The billing information is as follows and has been shared with [facility].    RxBin: [id number] PCN: [id number] Member ID: [id number] Group ID: [id number] Dates of Eligibility: 06/14/21 through 06/13/22  Fund:  [facility] [place] Phone [phone] Fax [phone] 07/16/2021 11:37 AM

## 2021-07-19 ENCOUNTER — Inpatient Hospital Stay (HOSPITAL_BASED_OUTPATIENT_CLINIC_OR_DEPARTMENT_OTHER): Payer: Medicare Other | Admitting: Nurse Practitioner

## 2021-07-19 ENCOUNTER — Other Ambulatory Visit (HOSPITAL_COMMUNITY): Payer: Self-pay

## 2021-07-19 ENCOUNTER — Encounter: Payer: Self-pay | Admitting: Nurse Practitioner

## 2021-07-19 ENCOUNTER — Other Ambulatory Visit: Payer: Self-pay

## 2021-07-19 ENCOUNTER — Inpatient Hospital Stay: Payer: Medicare Other

## 2021-07-19 ENCOUNTER — Encounter: Payer: Self-pay | Admitting: Oncology

## 2021-07-19 VITALS — BP 143/77 | HR 98 | Temp 98.7°F | Resp 18 | Ht 61.0 in | Wt 154.2 lb

## 2021-07-19 DIAGNOSIS — C179 Malignant neoplasm of small intestine, unspecified: Secondary | ICD-10-CM

## 2021-07-19 DIAGNOSIS — C61 Malignant neoplasm of prostate: Secondary | ICD-10-CM | POA: Diagnosis not present

## 2021-07-19 DIAGNOSIS — Z5111 Encounter for antineoplastic chemotherapy: Secondary | ICD-10-CM | POA: Diagnosis not present

## 2021-07-19 LAB — CBC WITH DIFFERENTIAL (CANCER CENTER ONLY)
Abs Immature Granulocytes: 0.03 10*3/uL (ref 0.00–0.07)
Basophils Absolute: 0 10*3/uL (ref 0.0–0.1)
Basophils Relative: 0 %
Eosinophils Absolute: 0 10*3/uL (ref 0.0–0.5)
Eosinophils Relative: 0 %
HCT: 26.1 % — ABNORMAL LOW (ref 39.0–52.0)
Hemoglobin: 7.9 g/dL — ABNORMAL LOW (ref 13.0–17.0)
Immature Granulocytes: 1 %
Lymphocytes Relative: 9 %
Lymphs Abs: 0.6 10*3/uL — ABNORMAL LOW (ref 0.7–4.0)
MCH: 29.9 pg (ref 26.0–34.0)
MCHC: 30.3 g/dL (ref 30.0–36.0)
MCV: 98.9 fL (ref 80.0–100.0)
Monocytes Absolute: 0.3 10*3/uL (ref 0.1–1.0)
Monocytes Relative: 5 %
Neutro Abs: 5.2 10*3/uL (ref 1.7–7.7)
Neutrophils Relative %: 85 %
Platelet Count: 248 10*3/uL (ref 150–400)
RBC: 2.64 MIL/uL — ABNORMAL LOW (ref 4.22–5.81)
RDW: 14.2 % (ref 11.5–15.5)
WBC Count: 6.1 10*3/uL (ref 4.0–10.5)
nRBC: 0 % (ref 0.0–0.2)

## 2021-07-19 LAB — URINALYSIS, COMPLETE (UACMP) WITH MICROSCOPIC
Bilirubin Urine: NEGATIVE
Glucose, UA: 100 mg/dL — AB
Ketones, ur: NEGATIVE mg/dL
Leukocytes,Ua: NEGATIVE
Nitrite: NEGATIVE
Specific Gravity, Urine: 1.012 (ref 1.005–1.030)
pH: 5 (ref 5.0–8.0)

## 2021-07-19 LAB — BASIC METABOLIC PANEL - CANCER CENTER ONLY
Anion gap: 11 (ref 5–15)
BUN: 33 mg/dL — ABNORMAL HIGH (ref 8–23)
CO2: 23 mmol/L (ref 22–32)
Calcium: 9.5 mg/dL (ref 8.9–10.3)
Chloride: 108 mmol/L (ref 98–111)
Creatinine: 1.46 mg/dL — ABNORMAL HIGH (ref 0.61–1.24)
GFR, Estimated: 54 mL/min — ABNORMAL LOW (ref >60)
Glucose, Bld: 170 mg/dL — ABNORMAL HIGH (ref 70–99)
Potassium: 3.9 mmol/L (ref 3.5–5.1)
Sodium: 142 mmol/L (ref 135–145)

## 2021-07-19 LAB — SAMPLE TO BLOOD BANK

## 2021-07-19 NOTE — Progress Notes (Signed)
[place] OFFICE PROGRESS NOTE   Diagnosis: Prostate cancer, history of small bowel cancer  INTERVAL HISTORY:   Brandon Peters returns as scheduled.  At the time of his last office visit 07/05/2021 hemoglobin was lower and creatinine was elevated.  He completed stool cards which returned positive.  Renal ultrasound on 07/07/2021 was negative for hydronephrosis.  There has been increase in the size of a 4 x 2.6 x 3.2 renal cell carcinoma.  He is unaware of any bleeding.  He describes his energy level as "okay".  No shortness of breath.  He has noted increased edema at the lower leg since placing blood pressure medications on hold.  Objective:  Vital signs in last 24 hours:  Blood pressure (!) 143/77, pulse 98, temperature 98.7 F (37.1 C), temperature source Oral, resp. rate 18, height _0  (1.549 m), weight 154 lb 3.2 oz (69.9 kg), SpO2 100 %.    Resp: Lungs clear bilaterally. Cardio: Regular rate and rhythm. GI: Abdomen soft and nontender.  No hepatosplenomegaly. Vascular: Trace edema lower leg bilaterally.    Lab Results:  Lab Results  Component Value Date   WBC 6.1 07/19/2021   HGB 7.9 (L) 07/19/2021   HCT 26.1 (L) 07/19/2021   MCV 98.9 07/19/2021   PLT 248 07/19/2021   NEUTROABS 5.2 07/19/2021    Imaging:  No results found.  Medications: I have reviewed the patient's current medications.  Assessment/Plan: 1. History of Severe anemia-status post a red cell transfusion 03/04/2016 2.. Prostate cancer Extensive sclerotic bone metastases noted on a CT of the abdomen/pelvis 03/04/2016 and MRI abdomen 03/05/2016 Markedly elevated PSA Lupron initiated 03/15/2016; Casodex 14 days beginning 03/15/2016 Prostate biopsy 03/18/2016 05/09/2016 PSA significantly improved at 21 Abiraterone initiated 05/10/2016 PSA less than 0.1 07/10/2018 PSA less than 0.1 08/30/2018 PSA less than 0.1 10/18/2018 PSA less than 0.1 11/29/2018 PSA less than 0.1 on 11/06/2019  PSA  less than 0.1 on 03/05/2020 PSA less than 0.1 on 07/06/2020   3.   Symptoms of obstructive uropathy. Improved   4.   Distal duodenal and descending colon masses biopsy of the duodenal mass 03/04/2016 confirmed fragments of small bowel mucosa with focal high-grade dysplasia Biopsy of the descending colon "polyps" revealed fragments of a tubulovillous adenoma CT 08/29/2017-similar soft tissue thickening in the region of the splenic flexure of the colon suboptimally evaluated due to lack of colonic enteric contrast Referred to Dr. Benson Norway Referred to [facility] Resection of descending colon polyps 01/31/2018, tubulovillous adenomas with high-grade dysplasia Attempted endoscopic resection of the fourth segment duodenal "polyp" on 05/09/2018, firm unresectable lesion, biopsy revealed moderately differentiated adenocarcinoma CT abdomen/pelvis 05/25/2018- heterogeneously enhancing right lower pole renal lesion; diffuse sclerotic osseous metastatic lesions compatible with history of prostate cancer CT chest 05/25/2018- no pulmonary metastasis.  Diffuse osseous metastasis involving the axial and appendicular skeleton. 06/19/2018-sleeve resection of third and fourth portions of duodenum with primary end-to-end anastomosis, feeding gastrojejunostomy tube placement, Dr. Cloyd Stagers; poorly differentiated adenocarcinoma duodenum, 3 cm; tumor invades the muscularis propria; lymphovascular invasion present; negative margins; 4 of 11 involved lymph nodes; soft tissue deposits also seen in the mesentery; all mucosal margins and soft tissue margins negative but 2 of the positive nodes are found in the soft tissue margin submitted for frozen section (pT2 pN2); second mucosal lesion 1.2 cm from the proximal margin, tubular adenoma with focal severe dysplasia Intact expression of mismatch repair proteins by IHC; microsatellite stable Cycle 1 adjuvant Xeloda 07/23/2018 Cycle 2 adjuvant Xeloda 08/13/2018 Cycle 3  adjuvant Xeloda 09/10/2018  (dose reduced to 1000 mg twice daily for 14 days)  Cycle 4 adjuvant Xeloda 10/01/2018 (dose increased to 1500 mg every morning and 1000 mg every afternoon for 14 days) Cycle 5 adjuvant Xeloda 10/22/2018 Cycle 6 adjuvant Xeloda 11/12/2018 Cycle 7 adjuvant Xeloda 12/03/2018 Cycle 8 adjuvant Xeloda 12/24/2018   5.  Anorexia/weight loss- resolved   6.  Undescended testicles   7.  Right renal mass.  CT 08/29/2017-right renal mass involving the lower pole was partially calcified and does demonstrate enhancement following contrast most consistent with renal cell carcinoma.  Lesion not significantly enlarged compared with the prior CT. Followed by urology.   8.  Diabetes   9.  Anemia-stool Hemoccults + March 2021, referred to Dr. Benson Norway for endoscopic evaluation, anemia also secondary to metastatic prostate cancer, hypogonadism Small bowel enteroscopy 02/11/2020-esophagus normal.  Hematin found in the gastric body.  Examined duodenum normal.  No evidence of significant pathology in the entire examined portion of the jejunum.  There was no overt source of bleeding or inflammation.  The bleeding may be from small AVMs.  Surgical anastomosis was not visible with repeated inspection of the duodenum. Colonoscopy 02/11/2020-multiple sessile and semipedunculated polyps were found in the rectum, descending colon and ascending colon, 3 to 18 mm in size.  Polyps removed (polypectomy descending colon-fragments of tubular adenoma, inflammatory type polyp; polypectomy ascending colon-tubular adenoma, no high-grade dysplasia or malignancy; polypectomy descending colon-fragments of tubulovillous adenoma, no high-grade dysplasia or malignancy; polypectomy colon, rectum-tubular adenoma, no high-grade dysplasia or malignancy). Colonoscopy 02/02/2021-seven 2 to 10 mm polyps in the sigmoid colon (tubular adenoma), descending colon (fragments of tubulovillous adenoma, fragments of tubular adenoma ) and transverse colon (fragments of  tubular adenoma). Progressive anemia 07/05/2021; stool cards positive    Disposition: Brandon Peters appears unchanged.  We reviewed the CBC from today.  Hemoglobin is stable.  He is asymptomatic.  We discussed the positive stool cards.  Referral made to Dr. Benson Norway.  We discussed signs/symptoms suggestive of progressive anemia.  He understands to contact the office should he develop any of these.  We discussed the renal ultrasound which showed the right kidney mass is larger.  There was no evidence of hydronephrosis.  Referral made to [facility].  Losartan and triamterene/hydrochlorothiazide placed on hold 2 weeks ago when they creatinine returned elevated.  Creatinine is better today but remains elevated.  He will follow-up with PCP regarding the elevated creatinine and blood pressure regimen.  He will return for a repeat CBC in 2 weeks.  We will see him in follow-up in 4 weeks.  He will contact the office in the interim as outlined above or with any other problems.  Plan reviewed with Dr. Benay Spice.    Ned Card ANP/GNP-BC   07/19/2021  1:57 PM

## 2021-07-20 ENCOUNTER — Encounter: Payer: Self-pay | Admitting: *Deleted

## 2021-07-20 NOTE — Progress Notes (Unsigned)
Faxed referral order, demographics and chart information to Dr. Carol Ada (urgent) at [phone] and [facility] at [phone].

## 2021-07-27 ENCOUNTER — Telehealth: Payer: Self-pay | Admitting: *Deleted

## 2021-07-27 NOTE — Telephone Encounter (Signed)
Called patient to f/u on his referrals to Dr. Benson Norway and urology. He reports Dr. Ulyses Amor office called today and he will be having a CT scan on 11/3 per Dr. Gloriann Loan (urology).

## 2021-08-02 ENCOUNTER — Other Ambulatory Visit (HOSPITAL_COMMUNITY): Payer: Self-pay

## 2021-08-02 ENCOUNTER — Encounter: Payer: Self-pay | Admitting: Oncology

## 2021-08-05 ENCOUNTER — Other Ambulatory Visit: Payer: Self-pay | Admitting: Urology

## 2021-08-05 ENCOUNTER — Other Ambulatory Visit: Payer: Self-pay

## 2021-08-05 ENCOUNTER — Other Ambulatory Visit (HOSPITAL_COMMUNITY): Payer: Self-pay | Admitting: Urology

## 2021-08-05 ENCOUNTER — Inpatient Hospital Stay: Payer: Medicare Other | Attending: Oncology

## 2021-08-05 DIAGNOSIS — D649 Anemia, unspecified: Secondary | ICD-10-CM | POA: Diagnosis not present

## 2021-08-05 DIAGNOSIS — N289 Disorder of kidney and ureter, unspecified: Secondary | ICD-10-CM | POA: Insufficient documentation

## 2021-08-05 DIAGNOSIS — Z86010 Personal history of colonic polyps: Secondary | ICD-10-CM | POA: Insufficient documentation

## 2021-08-05 DIAGNOSIS — C61 Malignant neoplasm of prostate: Secondary | ICD-10-CM | POA: Diagnosis present

## 2021-08-05 DIAGNOSIS — N139 Obstructive and reflux uropathy, unspecified: Secondary | ICD-10-CM | POA: Diagnosis not present

## 2021-08-05 DIAGNOSIS — E291 Testicular hypofunction: Secondary | ICD-10-CM | POA: Diagnosis not present

## 2021-08-05 DIAGNOSIS — D4101 Neoplasm of uncertain behavior of right kidney: Secondary | ICD-10-CM

## 2021-08-05 DIAGNOSIS — C7951 Secondary malignant neoplasm of bone: Secondary | ICD-10-CM | POA: Diagnosis present

## 2021-08-05 DIAGNOSIS — C179 Malignant neoplasm of small intestine, unspecified: Secondary | ICD-10-CM

## 2021-08-05 LAB — CBC WITH DIFFERENTIAL (CANCER CENTER ONLY)
Abs Immature Granulocytes: 0.02 10*3/uL (ref 0.00–0.07)
Basophils Absolute: 0 10*3/uL (ref 0.0–0.1)
Basophils Relative: 0 %
Eosinophils Absolute: 0 10*3/uL (ref 0.0–0.5)
Eosinophils Relative: 0 %
HCT: 28.2 % — ABNORMAL LOW (ref 39.0–52.0)
Hemoglobin: 8.4 g/dL — ABNORMAL LOW (ref 13.0–17.0)
Immature Granulocytes: 0 %
Lymphocytes Relative: 11 %
Lymphs Abs: 0.7 10*3/uL (ref 0.7–4.0)
MCH: 29.2 pg (ref 26.0–34.0)
MCHC: 29.8 g/dL — ABNORMAL LOW (ref 30.0–36.0)
MCV: 97.9 fL (ref 80.0–100.0)
Monocytes Absolute: 0.3 10*3/uL (ref 0.1–1.0)
Monocytes Relative: 5 %
Neutro Abs: 5.1 10*3/uL (ref 1.7–7.7)
Neutrophils Relative %: 84 %
Platelet Count: 272 10*3/uL (ref 150–400)
RBC: 2.88 MIL/uL — ABNORMAL LOW (ref 4.22–5.81)
RDW: 13.3 % (ref 11.5–15.5)
WBC Count: 6.1 10*3/uL (ref 4.0–10.5)
nRBC: 0 % (ref 0.0–0.2)

## 2021-08-05 LAB — BASIC METABOLIC PANEL - CANCER CENTER ONLY
Anion gap: 13 (ref 5–15)
BUN: 37 mg/dL — ABNORMAL HIGH (ref 8–23)
CO2: 27 mmol/L (ref 22–32)
Calcium: 9.8 mg/dL (ref 8.9–10.3)
Chloride: 102 mmol/L (ref 98–111)
Creatinine: 1.62 mg/dL — ABNORMAL HIGH (ref 0.61–1.24)
GFR, Estimated: 47 mL/min — ABNORMAL LOW (ref >60)
Glucose, Bld: 204 mg/dL — ABNORMAL HIGH (ref 70–99)
Potassium: 3.5 mmol/L (ref 3.5–5.1)
Sodium: 142 mmol/L (ref 135–145)

## 2021-08-06 ENCOUNTER — Telehealth: Payer: Self-pay

## 2021-08-06 LAB — SAMPLE TO BLOOD BANK

## 2021-08-06 NOTE — Telephone Encounter (Signed)
Called spoke with pt concerning improved hgb and Kidney function no new orders no concerns voiced

## 2021-08-06 NOTE — Telephone Encounter (Signed)
-----   Message from Owens Shark, NP sent at 08/06/2021  3:02 PM EST ----- Please let him know hemoglobin and kidney function have improved.  Follow-up as scheduled.

## 2021-08-10 ENCOUNTER — Other Ambulatory Visit: Payer: Self-pay | Admitting: Nurse Practitioner

## 2021-08-10 DIAGNOSIS — C61 Malignant neoplasm of prostate: Secondary | ICD-10-CM

## 2021-08-10 DIAGNOSIS — C179 Malignant neoplasm of small intestine, unspecified: Secondary | ICD-10-CM

## 2021-08-10 DIAGNOSIS — Z85068 Personal history of other malignant neoplasm of small intestine: Secondary | ICD-10-CM

## 2021-08-11 ENCOUNTER — Encounter: Payer: Self-pay | Admitting: Oncology

## 2021-08-12 ENCOUNTER — Ambulatory Visit (HOSPITAL_COMMUNITY)
Admission: RE | Admit: 2021-08-12 | Discharge: 2021-08-12 | Disposition: A | Discharge status: Home / Self Care | Payer: Medicare Other | Source: Ambulatory Visit | Attending: Urology | Admitting: Urology

## 2021-08-12 DIAGNOSIS — D4101 Neoplasm of uncertain behavior of right kidney: Secondary | ICD-10-CM | POA: Diagnosis present

## 2021-08-12 MED ORDER — GADOBUTROL 1 MMOL/ML IV SOLN
7.5000 mL | Freq: Once | INTRAVENOUS | Status: AC | PRN
  Administered 2021-08-12: 20:00:00 7.5 mL via INTRAVENOUS

## 2021-08-17 ENCOUNTER — Other Ambulatory Visit: Payer: Self-pay

## 2021-08-17 ENCOUNTER — Inpatient Hospital Stay: Payer: Medicare Other

## 2021-08-17 ENCOUNTER — Inpatient Hospital Stay: Payer: Medicare Other | Admitting: Oncology

## 2021-08-17 VITALS — BP 134/74 | HR 98 | Temp 97.8°F | Resp 18 | Ht 61.0 in | Wt 149.0 lb

## 2021-08-17 DIAGNOSIS — C61 Malignant neoplasm of prostate: Secondary | ICD-10-CM

## 2021-08-17 DIAGNOSIS — C179 Malignant neoplasm of small intestine, unspecified: Secondary | ICD-10-CM

## 2021-08-17 LAB — CBC WITH DIFFERENTIAL (CANCER CENTER ONLY)
Abs Immature Granulocytes: 0.02 10*3/uL (ref 0.00–0.07)
Basophils Absolute: 0 10*3/uL (ref 0.0–0.1)
Basophils Relative: 0 %
Eosinophils Absolute: 0.1 10*3/uL (ref 0.0–0.5)
Eosinophils Relative: 1 %
HCT: 28.7 % — ABNORMAL LOW (ref 39.0–52.0)
Hemoglobin: 8.7 g/dL — ABNORMAL LOW (ref 13.0–17.0)
Immature Granulocytes: 0 %
Lymphocytes Relative: 9 %
Lymphs Abs: 0.6 10*3/uL — ABNORMAL LOW (ref 0.7–4.0)
MCH: 29.6 pg (ref 26.0–34.0)
MCHC: 30.3 g/dL (ref 30.0–36.0)
MCV: 97.6 fL (ref 80.0–100.0)
Monocytes Absolute: 0.5 10*3/uL (ref 0.1–1.0)
Monocytes Relative: 7 %
Neutro Abs: 5.5 10*3/uL (ref 1.7–7.7)
Neutrophils Relative %: 83 %
Platelet Count: 240 10*3/uL (ref 150–400)
RBC: 2.94 MIL/uL — ABNORMAL LOW (ref 4.22–5.81)
RDW: 13.1 % (ref 11.5–15.5)
WBC Count: 6.8 10*3/uL (ref 4.0–10.5)
nRBC: 0 % (ref 0.0–0.2)

## 2021-08-17 LAB — BASIC METABOLIC PANEL - CANCER CENTER ONLY
Anion gap: 12 (ref 5–15)
BUN: 37 mg/dL — ABNORMAL HIGH (ref 8–23)
CO2: 31 mmol/L (ref 22–32)
Calcium: 10.6 mg/dL — ABNORMAL HIGH (ref 8.9–10.3)
Chloride: 101 mmol/L (ref 98–111)
Creatinine: 1.73 mg/dL — ABNORMAL HIGH (ref 0.61–1.24)
GFR, Estimated: 44 mL/min — ABNORMAL LOW (ref >60)
Glucose, Bld: 210 mg/dL — ABNORMAL HIGH (ref 70–99)
Potassium: 3 mmol/L — ABNORMAL LOW (ref 3.5–5.1)
Sodium: 144 mmol/L (ref 135–145)

## 2021-08-17 LAB — SAMPLE TO BLOOD BANK

## 2021-08-17 NOTE — Progress Notes (Signed)
[place] OFFICE PROGRESS NOTE   Diagnosis: Prostate cancer, small bowel carcinoma  INTERVAL HISTORY:   Brandon Peters returns as scheduled.  He feels well.  He is taking iron.  No bleeding.  He continues abiraterone and prednisone. He saw Dr. Tresa Moore last month.  A renal ultrasound on 07/06/2021 revealed enlargement of a right renal mass.  An MRI of the abdomen 08/12/2021 revealed a heterogenously enhancing mass in the lower pole of the right kidney extending into the perinephric fat with mass-effect on the right psoas muscle.  There are filling defects in the left renal vein extending to the IVC to the level of the IVC/RA junction.  Upper normal retrocaval node.  Objective:  Vital signs in last 24 hours:  Blood pressure 134/74, pulse 98, temperature 97.8 F (36.6 C), temperature source Oral, resp. rate 18, height '5\' 1"'  (1.549 m), weight 149 lb (67.6 kg), SpO2 100 %.      Resp: Lungs clear bilaterally Cardio: Regular rate and rhythm GI: No hepatosplenomegaly, no mass Vascular: No leg edema  Lab Results:  Lab Results  Component Value Date   WBC 6.8 08/17/2021   HGB 8.7 (L) 08/17/2021   HCT 28.7 (L) 08/17/2021   MCV 97.6 08/17/2021   PLT 240 08/17/2021   NEUTROABS 5.5 08/17/2021    CMP  Lab Results  Component Value Date   NA 144 08/17/2021   K 3.0 (L) 08/17/2021   CL 101 08/17/2021   CO2 31 08/17/2021   GLUCOSE 210 (H) 08/17/2021   BUN 37 (H) 08/17/2021   CREATININE 1.73 (H) 08/17/2021   CALCIUM 10.6 (H) 08/17/2021   PROT 7.4 07/05/2021   ALBUMIN 4.4 07/05/2021   AST 13 (L) 07/05/2021   ALT 10 07/05/2021   ALKPHOS 41 07/05/2021   BILITOT 0.4 07/05/2021   GFRNONAA 44 (L) 08/17/2021   GFRAA >60 03/05/2020    Lab Results  Component Value Date   CEA1 3.31 05/03/2019    No results found for: INR, LABPROT  Imaging:  No results found.  Medications: I have reviewed the patient's current medications.   Assessment/Plan: History of Severe  anemia-status post a red cell transfusion 03/04/2016 2.. Prostate cancer Extensive sclerotic bone metastases noted on a CT of the abdomen/pelvis 03/04/2016 and MRI abdomen 03/05/2016 Markedly elevated PSA Lupron initiated 03/15/2016; Casodex 14 days beginning 03/15/2016 Prostate biopsy 03/18/2016 05/09/2016 PSA significantly improved at 21 Abiraterone initiated 05/10/2016 PSA less than 0.1 07/10/2018 PSA less than 0.1 08/30/2018 PSA less than 0.1 10/18/2018 PSA less than 0.1 11/29/2018 PSA less than 0.1 on 11/06/2019  PSA less than 0.1 on 03/05/2020 PSA less than 0.1 on 07/06/2020   3.   Symptoms of obstructive uropathy. Improved   4.   Distal duodenal and descending colon masses biopsy of the duodenal mass 03/04/2016 confirmed fragments of small bowel mucosa with focal high-grade dysplasia Biopsy of the descending colon "polyps" revealed fragments of a tubulovillous adenoma CT 08/29/2017-similar soft tissue thickening in the region of the splenic flexure of the colon suboptimally evaluated due to lack of colonic enteric contrast Referred to Dr. Benson Norway Referred to [facility] Resection of descending colon polyps 01/31/2018, tubulovillous adenomas with high-grade dysplasia Attempted endoscopic resection of the fourth segment duodenal "polyp" on 05/09/2018, firm unresectable lesion, biopsy revealed moderately differentiated adenocarcinoma CT abdomen/pelvis 05/25/2018- heterogeneously enhancing right lower pole renal lesion; diffuse sclerotic osseous metastatic lesions compatible with history of prostate cancer CT chest 05/25/2018- no pulmonary metastasis.  Diffuse osseous metastasis involving the axial and  appendicular skeleton. 06/19/2018-sleeve resection of third and fourth portions of duodenum with primary end-to-end anastomosis, feeding gastrojejunostomy tube placement, Dr. Cloyd Stagers; poorly differentiated adenocarcinoma duodenum, 3 cm; tumor invades the muscularis propria; lymphovascular invasion present;  negative margins; 4 of 11 involved lymph nodes; soft tissue deposits also seen in the mesentery; all mucosal margins and soft tissue margins negative but 2 of the positive nodes are found in the soft tissue margin submitted for frozen section (pT2 pN2); second mucosal lesion 1.2 cm from the proximal margin, tubular adenoma with focal severe dysplasia Intact expression of mismatch repair proteins by IHC; microsatellite stable Cycle 1 adjuvant Xeloda 07/23/2018 Cycle 2 adjuvant Xeloda 08/13/2018 Cycle 3 adjuvant Xeloda 09/10/2018 (dose reduced to 1000 mg twice daily for 14 days)  Cycle 4 adjuvant Xeloda 10/01/2018 (dose increased to 1500 mg every morning and 1000 mg every afternoon for 14 days) Cycle 5 adjuvant Xeloda 10/22/2018 Cycle 6 adjuvant Xeloda 11/12/2018 Cycle 7 adjuvant Xeloda 12/03/2018 Cycle 8 adjuvant Xeloda 12/24/2018   5.  Anorexia/weight loss- resolved   6.  Undescended testicles   7.  Right renal mass.  CT 08/29/2017-right renal mass involving the lower pole was partially calcified and does demonstrate enhancement following contrast most consistent with renal cell carcinoma.  Lesion not significantly enlarged compared with the prior CT. Followed by urology. 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 to the IVC/RA junction, upper normal size retrocaval node   8.  Diabetes   9.  Anemia-stool Hemoccults + March 2021, referred to Dr. Benson Norway for endoscopic evaluation, anemia also  secondary to metastatic prostate cancer, hypogonadism Small bowel enteroscopy 02/11/2020-esophagus normal.  Hematin found in the gastric body.  Examined duodenum normal.  No evidence of significant pathology in the entire examined portion of the jejunum.  There was no overt source of bleeding or inflammation.  The bleeding may be from small AVMs.  Surgical anastomosis was not visible with repeated inspection of the duodenum. Colonoscopy 02/11/2020-multiple sessile and semipedunculated polyps were found in the rectum, descending colon and ascending colon, 3 to 18 mm in size.  Polyps removed (polypectomy descending colon-fragments of tubular adenoma, inflammatory type polyp; polypectomy ascending colon-tubular adenoma, no high-grade dysplasia or malignancy; polypectomy descending colon-fragments of tubulovillous adenoma, no high-grade dysplasia or malignancy; polypectomy colon, rectum-tubular adenoma, no high-grade dysplasia or malignancy). Colonoscopy 02/02/2021-seven 2 to 10 mm polyps in the sigmoid colon (tubular adenoma), descending colon (fragments of tubulovillous adenoma, fragments of tubular adenoma ) and transverse colon (fragments of tubular adenoma). Progressive anemia 07/05/2021; stool cards positive      Disposition: Brandon Peters has a history of metastatic prostate cancer.  He continues treatment with Lupron and abiraterone.  There is no clinical evidence of disease progression.  The PSA remains low.  Recent imaging has confirmed enlargement of a right renal mass with extension into the IVC to the level of the right atrium.  He appears to have locally advanced renal cell carcinoma.  He saw Dr. Tresa Moore.  He has been referred to [facility] to consider treatment options.  He may not be a surgical candidate  with a history of metastatic prostate cancer.  I suspect the anemia is secondary to bleeding from the renal mass, renal insufficiency, and potentially GI bleeding.  The calcium level is mildly  elevated today.  This may be secondary to HCTZ.  He will return for a chemistry panel in approximately 10 days.  Brandon Peters will be scheduled for an office visit in 3 weeks.  We will consider initiating systemic therapy for the renal cell tumor based on the recommendation from [facility].  [name], MD  08/17/2021  11:31 AM

## 2021-08-18 ENCOUNTER — Telehealth: Payer: Self-pay

## 2021-08-18 LAB — PROSTATE-SPECIFIC AG, SERUM (LABCORP): Prostate Specific Ag, Serum: <0.1 ng/mL (ref 0.0–4.0)

## 2021-08-18 NOTE — Telephone Encounter (Signed)
-----   Message from Ladell Pier, MD sent at 08/18/2021  4:14 PM EST ----- Please call patient, potassium was low yesterday, be sure he is taking the potassium as prescribed, increase to 20 mEq twice daily if he has been taking potassium

## 2021-08-18 NOTE — Telephone Encounter (Signed)
Pt stated he is taking his potassium. Informed Pt per Dr. Benay Spice he is to take the potassium 2 times a day. Pt verbalized understanding.

## 2021-08-25 ENCOUNTER — Other Ambulatory Visit: Payer: Self-pay | Admitting: Oncology

## 2021-08-26 ENCOUNTER — Inpatient Hospital Stay: Payer: Medicare Other | Attending: Oncology

## 2021-08-26 ENCOUNTER — Other Ambulatory Visit: Payer: Self-pay

## 2021-08-26 DIAGNOSIS — C7951 Secondary malignant neoplasm of bone: Secondary | ICD-10-CM | POA: Diagnosis present

## 2021-08-26 DIAGNOSIS — E119 Type 2 diabetes mellitus without complications: Secondary | ICD-10-CM | POA: Diagnosis not present

## 2021-08-26 DIAGNOSIS — Z86010 Personal history of colonic polyps: Secondary | ICD-10-CM | POA: Diagnosis not present

## 2021-08-26 DIAGNOSIS — Q5320 Undescended testicle, unspecified, bilateral: Secondary | ICD-10-CM | POA: Insufficient documentation

## 2021-08-26 DIAGNOSIS — E291 Testicular hypofunction: Secondary | ICD-10-CM | POA: Diagnosis not present

## 2021-08-26 DIAGNOSIS — N289 Disorder of kidney and ureter, unspecified: Secondary | ICD-10-CM | POA: Insufficient documentation

## 2021-08-26 DIAGNOSIS — Z79899 Other long term (current) drug therapy: Secondary | ICD-10-CM | POA: Insufficient documentation

## 2021-08-26 DIAGNOSIS — C61 Malignant neoplasm of prostate: Secondary | ICD-10-CM | POA: Diagnosis not present

## 2021-08-26 DIAGNOSIS — D630 Anemia in neoplastic disease: Secondary | ICD-10-CM | POA: Diagnosis not present

## 2021-08-26 LAB — CMP (CANCER CENTER ONLY)
ALT: 13 U/L (ref 0–44)
AST: 17 U/L (ref 15–41)
Albumin: 4.3 g/dL (ref 3.5–5.0)
Alkaline Phosphatase: 53 U/L (ref 38–126)
Anion gap: 12 (ref 5–15)
BUN: 39 mg/dL — ABNORMAL HIGH (ref 8–23)
CO2: 27 mmol/L (ref 22–32)
Calcium: 10 mg/dL (ref 8.9–10.3)
Chloride: 102 mmol/L (ref 98–111)
Creatinine: 1.72 mg/dL — ABNORMAL HIGH (ref 0.61–1.24)
GFR, Estimated: 44 mL/min — ABNORMAL LOW (ref >60)
Glucose, Bld: 201 mg/dL — ABNORMAL HIGH (ref 70–99)
Potassium: 3.9 mmol/L (ref 3.5–5.1)
Sodium: 141 mmol/L (ref 135–145)
Total Bilirubin: 0.3 mg/dL (ref 0.3–1.2)
Total Protein: 6.9 g/dL (ref 6.5–8.1)

## 2021-09-02 ENCOUNTER — Encounter: Payer: Self-pay | Admitting: Oncology

## 2021-09-02 ENCOUNTER — Other Ambulatory Visit (HOSPITAL_COMMUNITY): Payer: Self-pay

## 2021-09-03 ENCOUNTER — Telehealth: Payer: Self-pay | Admitting: *Deleted

## 2021-09-03 NOTE — Telephone Encounter (Signed)
Patient concerned that he has not heard anything from Dr. Tresa Moore at [facility] or [facility] where he was being referred. Spoke w/ Jenny Reichmann in triage at [facility] and she will f/u with Dr. Tresa Moore. Patient is aware.

## 2021-09-06 NOTE — Progress Notes (Signed)
..  Patient Assist/Replace for the following has been terminated. Medication: Eligard (leuprolide acetate) Reason for Termination: Program covered patient till end of calendar year, medicare obtained 10/27/2020. Last DOS: 07/05/2021.  Marland KitchenJuan Quam, CPhT IV Drug Replacement Specialist [place] Phone: [phone]

## 2021-09-09 ENCOUNTER — Other Ambulatory Visit: Payer: Self-pay

## 2021-09-09 ENCOUNTER — Inpatient Hospital Stay: Payer: Medicare Other | Admitting: Nurse Practitioner

## 2021-09-09 ENCOUNTER — Telehealth: Payer: Self-pay | Admitting: *Deleted

## 2021-09-09 ENCOUNTER — Encounter: Payer: Self-pay | Admitting: Nurse Practitioner

## 2021-09-09 ENCOUNTER — Inpatient Hospital Stay: Payer: Medicare Other

## 2021-09-09 ENCOUNTER — Encounter: Payer: Self-pay | Admitting: *Deleted

## 2021-09-09 VITALS — BP 137/73 | HR 100 | Temp 98.1°F | Resp 20 | Ht 61.0 in | Wt 150.6 lb

## 2021-09-09 DIAGNOSIS — C61 Malignant neoplasm of prostate: Secondary | ICD-10-CM

## 2021-09-09 DIAGNOSIS — Z85068 Personal history of other malignant neoplasm of small intestine: Secondary | ICD-10-CM | POA: Diagnosis not present

## 2021-09-09 DIAGNOSIS — C179 Malignant neoplasm of small intestine, unspecified: Secondary | ICD-10-CM

## 2021-09-09 LAB — CMP (CANCER CENTER ONLY)
ALT: 14 U/L (ref 0–44)
AST: 16 U/L (ref 15–41)
Albumin: 4.3 g/dL (ref 3.5–5.0)
Alkaline Phosphatase: 54 U/L (ref 38–126)
Anion gap: 11 (ref 5–15)
BUN: 40 mg/dL — ABNORMAL HIGH (ref 8–23)
CO2: 28 mmol/L (ref 22–32)
Calcium: 10 mg/dL (ref 8.9–10.3)
Chloride: 101 mmol/L (ref 98–111)
Creatinine: 1.74 mg/dL — ABNORMAL HIGH (ref 0.61–1.24)
GFR, Estimated: 43 mL/min — ABNORMAL LOW (ref >60)
Glucose, Bld: 147 mg/dL — ABNORMAL HIGH (ref 70–99)
Potassium: 3.3 mmol/L — ABNORMAL LOW (ref 3.5–5.1)
Sodium: 140 mmol/L (ref 135–145)
Total Bilirubin: 0.4 mg/dL (ref 0.3–1.2)
Total Protein: 7.3 g/dL (ref 6.5–8.1)

## 2021-09-09 LAB — CBC WITH DIFFERENTIAL (CANCER CENTER ONLY)
Abs Immature Granulocytes: 0.03 10*3/uL (ref 0.00–0.07)
Basophils Absolute: 0 10*3/uL (ref 0.0–0.1)
Basophils Relative: 0 %
Eosinophils Absolute: 0.1 10*3/uL (ref 0.0–0.5)
Eosinophils Relative: 1 %
HCT: 28.2 % — ABNORMAL LOW (ref 39.0–52.0)
Hemoglobin: 8.5 g/dL — ABNORMAL LOW (ref 13.0–17.0)
Immature Granulocytes: 0 %
Lymphocytes Relative: 10 %
Lymphs Abs: 0.7 10*3/uL (ref 0.7–4.0)
MCH: 29.2 pg (ref 26.0–34.0)
MCHC: 30.1 g/dL (ref 30.0–36.0)
MCV: 96.9 fL (ref 80.0–100.0)
Monocytes Absolute: 0.4 10*3/uL (ref 0.1–1.0)
Monocytes Relative: 6 %
Neutro Abs: 5.6 10*3/uL (ref 1.7–7.7)
Neutrophils Relative %: 83 %
Platelet Count: 263 10*3/uL (ref 150–400)
RBC: 2.91 MIL/uL — ABNORMAL LOW (ref 4.22–5.81)
RDW: 13.2 % (ref 11.5–15.5)
WBC Count: 6.9 10*3/uL (ref 4.0–10.5)
nRBC: 0 % (ref 0.0–0.2)

## 2021-09-09 LAB — SAMPLE TO BLOOD BANK

## 2021-09-09 MED ORDER — POTASSIUM CHLORIDE CRYS ER 20 MEQ PO TBCR: 20.0000 meq | EXTENDED_RELEASE_TABLET | Freq: Two times a day (BID) | ORAL | 1 refills | Status: DC

## 2021-09-09 NOTE — Telephone Encounter (Signed)
Spoke with Dr. Ulyses Amor office and they will call patient to move 10/14/21 procedure up sooner (scheduled for flexsig). Call them at [phone] if you do not hear from them by early next week. [facility] and confirmed referral to [facility] was sent on 09/01/21. Per [facility] referral/records received.They are "running behind on referrals." Was instructed to have patient call their scheduling department at [phone] to try to expedite the appointment. Notified Mr. Lori of this via phone and MyChart message.

## 2021-09-09 NOTE — Progress Notes (Signed)
[place] OFFICE PROGRESS NOTE   Diagnosis: Prostate cancer, small bowel carcinoma  INTERVAL HISTORY:   Brandon Peters returns as scheduled.  He feels a little more fatigued.  He denies bleeding.  Describes appetite as "fine".  No nausea or vomiting.  No change in bowel habits.  Objective:  Vital signs in last 24 hours:  Blood pressure 137/73, pulse 100, temperature 98.1 F (36.7 C), temperature source Oral, resp. rate 20, height _0  (1.549 m), weight 150 lb 9.6 oz (68.3 kg), SpO2 100 %.    Resp: Lungs clear bilaterally. Cardio: Regular rate and rhythm. GI: Abdomen soft and nontender.  No hepatomegaly. Vascular: No leg edema.   Lab Results:  Lab Results  Component Value Date   WBC 6.9 09/09/2021   HGB 8.5 (L) 09/09/2021   HCT 28.2 (L) 09/09/2021   MCV 96.9 09/09/2021   PLT 263 09/09/2021   NEUTROABS 5.6 09/09/2021    Imaging:  No results found.  Medications: I have reviewed the patient's current medications.  Assessment/Plan: History of Severe anemia-status post a red cell transfusion 03/04/2016 2.. Prostate cancer Extensive sclerotic bone metastases noted on a CT of the abdomen/pelvis 03/04/2016 and MRI abdomen 03/05/2016 Markedly elevated PSA Lupron initiated 03/15/2016; Casodex 14 days beginning 03/15/2016 Prostate biopsy 03/18/2016 05/09/2016 PSA significantly improved at 21 Abiraterone initiated 05/10/2016 PSA less than 0.1 07/10/2018 PSA less than 0.1 08/30/2018 PSA less than 0.1 10/18/2018 PSA less than 0.1 11/29/2018 PSA less than 0.1 on 11/06/2019  PSA less than 0.1 on 03/05/2020 PSA less than 0.1 on 07/06/2020   3.   Symptoms of obstructive uropathy. Improved   4.   Distal duodenal and descending colon masses biopsy of the duodenal mass 03/04/2016 confirmed fragments of small bowel mucosa with focal high-grade dysplasia Biopsy of the descending colon "polyps" revealed fragments of a tubulovillous adenoma CT 08/29/2017-similar soft  tissue thickening in the region of the splenic flexure of the colon suboptimally evaluated due to lack of colonic enteric contrast Referred to Dr. Benson Norway Referred to [facility] Resection of descending colon polyps 01/31/2018, tubulovillous adenomas with high-grade dysplasia Attempted endoscopic resection of the fourth segment duodenal "polyp" on 05/09/2018, firm unresectable lesion, biopsy revealed moderately differentiated adenocarcinoma CT abdomen/pelvis 05/25/2018- heterogeneously enhancing right lower pole renal lesion; diffuse sclerotic osseous metastatic lesions compatible with history of prostate cancer CT chest 05/25/2018- no pulmonary metastasis.  Diffuse osseous metastasis involving the axial and appendicular skeleton. 06/19/2018-sleeve resection of third and fourth portions of duodenum with primary end-to-end anastomosis, feeding gastrojejunostomy tube placement, Dr. Cloyd Stagers; poorly differentiated adenocarcinoma duodenum, 3 cm; tumor invades the muscularis propria; lymphovascular invasion present; negative margins; 4 of 11 involved lymph nodes; soft tissue deposits also seen in the mesentery; all mucosal margins and soft tissue margins negative but 2 of the positive nodes are found in the soft tissue margin submitted for frozen section (pT2 pN2); second mucosal lesion 1.2 cm from the proximal margin, tubular adenoma with focal severe dysplasia Intact expression of mismatch repair proteins by IHC; microsatellite stable Cycle 1 adjuvant Xeloda 07/23/2018 Cycle 2 adjuvant Xeloda 08/13/2018 Cycle 3 adjuvant Xeloda 09/10/2018 (dose reduced to 1000 mg twice daily for 14 days)  Cycle 4 adjuvant Xeloda 10/01/2018 (dose increased to 1500 mg every morning and 1000 mg every afternoon for 14 days) Cycle 5 adjuvant Xeloda 10/22/2018 Cycle 6 adjuvant Xeloda 11/12/2018 Cycle 7 adjuvant Xeloda 12/03/2018 Cycle 8 adjuvant Xeloda 12/24/2018   5.  Anorexia/weight loss- resolved   6.  Undescended testicles  7.  Right renal  mass.  CT 08/29/2017-right renal mass involving the lower pole was partially calcified and does demonstrate enhancement following contrast most consistent with renal cell carcinoma.  Lesion not significantly enlarged compared with the prior CT. Followed by urology. 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 to the IVC/RA junction, upper normal size retrocaval node   8.  Diabetes   9.  Anemia-stool Hemoccults + March 2021, referred to Dr. Benson Norway for endoscopic evaluation, anemia also secondary to metastatic prostate cancer, hypogonadism Small bowel enteroscopy 02/11/2020-esophagus normal.  Hematin found in the gastric body.  Examined duodenum normal.  No evidence of significant pathology in the entire examined portion of the jejunum.  There was no overt source of bleeding or inflammation.  The bleeding may be from small AVMs.  Surgical anastomosis was not visible with repeated inspection of the duodenum. Colonoscopy 02/11/2020-multiple sessile and semipedunculated polyps were found in the rectum, descending colon and ascending colon, 3 to 18 mm in size.  Polyps removed (polypectomy descending colon-fragments of tubular adenoma, inflammatory type polyp; polypectomy ascending colon-tubular adenoma, no high-grade dysplasia or malignancy; polypectomy descending colon-fragments of tubulovillous adenoma, no high-grade dysplasia or malignancy; polypectomy colon, rectum-tubular adenoma, no high-grade dysplasia  or malignancy). Colonoscopy 02/02/2021-seven 2 to 10 mm polyps in the sigmoid colon (tubular adenoma), descending colon (fragments of tubulovillous adenoma, fragments of tubular adenoma ) and transverse colon (fragments of tubular adenoma). Progressive anemia 07/05/2021; stool cards positive     Disposition: Brandon Peters appears unchanged.  He continues treatment with Lupron and abiraterone.  Most recent PSA was low.  He has been referred to [facility] regarding the renal cell carcinoma.  We will follow-up on that appointment.  We reviewed the CBC from today.  He has stable anemia.  He had positive stool cards in October.  He has an upcoming appointment with Dr. Benson Norway.  He will return for follow-up here in 3 weeks.  We are available to see him sooner if needed.   Ned Card ANP/GNP-BC   09/09/2021  1:51 PM

## 2021-09-14 ENCOUNTER — Encounter: Payer: Self-pay | Admitting: Nurse Practitioner

## 2021-09-15 ENCOUNTER — Encounter: Payer: Self-pay | Admitting: Nurse Practitioner

## 2021-09-15 ENCOUNTER — Telehealth: Payer: Self-pay

## 2021-09-15 NOTE — Telephone Encounter (Signed)
Patient send a my chart message about refill his potassium Called and spoke to the patient to let him know that he have a refill on her potassium. Patient voiced understanding

## 2021-09-16 ENCOUNTER — Telehealth: Payer: Self-pay | Admitting: *Deleted

## 2021-09-16 NOTE — Telephone Encounter (Signed)
Call from Dr. Lossie Faes at [facility]: Patient has been seen there by himself and Dr. Edd Arbour. Suggest he have pembrolizumab every 3 weeks x 3 cycles along with axitinib 5 mg bid. Re-scan after 3 cycles. Specific information is per [facility]. MD can call him to discuss if needed.

## 2021-09-23 ENCOUNTER — Telehealth: Payer: Self-pay | Admitting: *Deleted

## 2021-09-23 NOTE — Telephone Encounter (Signed)
Patient called to report he is having his colonoscopy today at 2:30 pm with Dr. Benson Norway.

## 2021-09-30 ENCOUNTER — Other Ambulatory Visit (HOSPITAL_COMMUNITY): Payer: Self-pay

## 2021-09-30 ENCOUNTER — Other Ambulatory Visit: Payer: Self-pay | Admitting: Oncology

## 2021-09-30 ENCOUNTER — Inpatient Hospital Stay: Payer: Medicare Other | Attending: Oncology

## 2021-09-30 ENCOUNTER — Telehealth: Payer: Self-pay | Admitting: Pharmacist

## 2021-09-30 ENCOUNTER — Inpatient Hospital Stay: Payer: Medicare Other | Admitting: Oncology

## 2021-09-30 ENCOUNTER — Telehealth: Payer: Self-pay | Admitting: Pharmacy Technician

## 2021-09-30 ENCOUNTER — Encounter: Payer: Self-pay | Admitting: *Deleted

## 2021-09-30 ENCOUNTER — Other Ambulatory Visit: Payer: Self-pay

## 2021-09-30 VITALS — BP 117/67 | HR 83 | Temp 98.1°F | Resp 18 | Ht 61.0 in | Wt 149.0 lb

## 2021-09-30 DIAGNOSIS — D630 Anemia in neoplastic disease: Secondary | ICD-10-CM | POA: Insufficient documentation

## 2021-09-30 DIAGNOSIS — N139 Obstructive and reflux uropathy, unspecified: Secondary | ICD-10-CM | POA: Diagnosis not present

## 2021-09-30 DIAGNOSIS — C641 Malignant neoplasm of right kidney, except renal pelvis: Secondary | ICD-10-CM | POA: Diagnosis not present

## 2021-09-30 DIAGNOSIS — E876 Hypokalemia: Secondary | ICD-10-CM | POA: Insufficient documentation

## 2021-09-30 DIAGNOSIS — Z86010 Personal history of colonic polyps: Secondary | ICD-10-CM | POA: Diagnosis not present

## 2021-09-30 DIAGNOSIS — Z79899 Other long term (current) drug therapy: Secondary | ICD-10-CM | POA: Insufficient documentation

## 2021-09-30 DIAGNOSIS — E119 Type 2 diabetes mellitus without complications: Secondary | ICD-10-CM | POA: Insufficient documentation

## 2021-09-30 DIAGNOSIS — Z5112 Encounter for antineoplastic immunotherapy: Secondary | ICD-10-CM | POA: Insufficient documentation

## 2021-09-30 DIAGNOSIS — C7951 Secondary malignant neoplasm of bone: Secondary | ICD-10-CM | POA: Insufficient documentation

## 2021-09-30 DIAGNOSIS — C179 Malignant neoplasm of small intestine, unspecified: Secondary | ICD-10-CM

## 2021-09-30 DIAGNOSIS — C61 Malignant neoplasm of prostate: Secondary | ICD-10-CM | POA: Diagnosis present

## 2021-09-30 DIAGNOSIS — N2889 Other specified disorders of kidney and ureter: Secondary | ICD-10-CM | POA: Diagnosis not present

## 2021-09-30 DIAGNOSIS — C170 Malignant neoplasm of duodenum: Secondary | ICD-10-CM | POA: Diagnosis not present

## 2021-09-30 DIAGNOSIS — Q5320 Undescended testicle, unspecified, bilateral: Secondary | ICD-10-CM | POA: Diagnosis not present

## 2021-09-30 DIAGNOSIS — C649 Malignant neoplasm of unspecified kidney, except renal pelvis: Secondary | ICD-10-CM | POA: Insufficient documentation

## 2021-09-30 LAB — CBC WITH DIFFERENTIAL (CANCER CENTER ONLY)
Abs Immature Granulocytes: 0.03 10*3/uL (ref 0.00–0.07)
Basophils Absolute: 0 10*3/uL (ref 0.0–0.1)
Basophils Relative: 0 %
Eosinophils Absolute: 0.1 10*3/uL (ref 0.0–0.5)
Eosinophils Relative: 1 %
HCT: 28.1 % — ABNORMAL LOW (ref 39.0–52.0)
Hemoglobin: 8.6 g/dL — ABNORMAL LOW (ref 13.0–17.0)
Immature Granulocytes: 0 %
Lymphocytes Relative: 14 %
Lymphs Abs: 1 10*3/uL (ref 0.7–4.0)
MCH: 29.5 pg (ref 26.0–34.0)
MCHC: 30.6 g/dL (ref 30.0–36.0)
MCV: 96.2 fL (ref 80.0–100.0)
Monocytes Absolute: 0.6 10*3/uL (ref 0.1–1.0)
Monocytes Relative: 9 %
Neutro Abs: 5.2 10*3/uL (ref 1.7–7.7)
Neutrophils Relative %: 76 %
Platelet Count: 261 10*3/uL (ref 150–400)
RBC: 2.92 MIL/uL — ABNORMAL LOW (ref 4.22–5.81)
RDW: 13.2 % (ref 11.5–15.5)
WBC Count: 6.9 10*3/uL (ref 4.0–10.5)
nRBC: 0 % (ref 0.0–0.2)

## 2021-09-30 LAB — CMP (CANCER CENTER ONLY)
ALT: 10 U/L (ref 0–44)
AST: 15 U/L (ref 15–41)
Albumin: 4.3 g/dL (ref 3.5–5.0)
Alkaline Phosphatase: 59 U/L (ref 38–126)
Anion gap: 9 (ref 5–15)
BUN: 40 mg/dL — ABNORMAL HIGH (ref 8–23)
CO2: 30 mmol/L (ref 22–32)
Calcium: 9.8 mg/dL (ref 8.9–10.3)
Chloride: 100 mmol/L (ref 98–111)
Creatinine: 1.83 mg/dL — ABNORMAL HIGH (ref 0.61–1.24)
GFR, Estimated: 41 mL/min — ABNORMAL LOW (ref >60)
Glucose, Bld: 133 mg/dL — ABNORMAL HIGH (ref 70–99)
Potassium: 3.3 mmol/L — ABNORMAL LOW (ref 3.5–5.1)
Sodium: 139 mmol/L (ref 135–145)
Total Bilirubin: 0.4 mg/dL (ref 0.3–1.2)
Total Protein: 7.6 g/dL (ref 6.5–8.1)

## 2021-09-30 MED ORDER — AXITINIB 5 MG PO TABS
5.0000 mg | ORAL_TABLET | Freq: Two times a day (BID) | ORAL | 0 refills | Status: DC
  Filled 2021-09-30 – 2021-10-13 (×2): qty 60, 30d supply, fill #0

## 2021-09-30 NOTE — Telephone Encounter (Signed)
Oral Oncology Pharmacist Encounter  Received new prescription for Inlyta (axitinib) for the neoadjuvant treatment of RCC in conjunction with pembrolizumab, planned duration of 3 cycles. Planned start 10/14/20.  CMP from 09/30/21 assessed, no relevant lab abnormalities. TSH lab order entered. BP from 09/30/21 well controlled, continue to monitor. Prescription dose and frequency assessed.   Current medication list in Epic reviewed, no DDIs with axitinib identified.  Evaluated chart and no patient barriers to medication adherence identified.   Prescription has been e-scribed to the [facility] for benefits analysis and approval.  Oral Oncology Clinic will continue to follow for insurance authorization, copayment issues, initial counseling and start date.  Patient agreed to treatment on 09/30/21 per MD documentation.  Darl Pikes, PharmD, BCPS, BCOP, CPP Hematology/Oncology Clinical Pharmacist Practitioner [place]/DB/AP Oral [facility] [phone]  09/30/2021 2:59 PM

## 2021-09-30 NOTE — Progress Notes (Signed)
[place] OFFICE PROGRESS NOTE   Diagnosis: Prostate cancer, small bowel carcinoma, renal cell carcinoma  INTERVAL HISTORY:   Brandon Peters returns as scheduled.  He feels well.  Good appetite.  No bleeding.  No complaint. He underwent a colonoscopy and upper endoscopy by Dr. Benson Norway on 09/23/2021.  Polyps were removed from the colon.  Innumerable small angiectasias with bleeding were found in the gastric fundus and gastric body..  This was felt to be the source of iron deficiency anemia.  The angioectasias were not amenable to endoscopic therapy.  The colon polyps returned as tubular adenomas.  He was evaluated medical oncology and urology at [facility] on 09/16/2021.  Treatment with axitinib and pembrolizumab is recommended for the apparent renal cell carcinoma.  He is not a surgical candidate at present.  The hope is for the neoadjuvant therapy will decrease the tumor burden and allow for surgical resection.  He is being scheduled for biopsy of the renal mass at [facility].  He continues abiraterone/prednisone for treatment of metastatic prostate cancer.  Objective:  Vital signs in last 24 hours:  Blood pressure 117/67, pulse 83, temperature 98.1 F (36.7 C), temperature source Oral, resp. rate 18, height '5\' 1"'  (1.549 m), weight 149 lb (67.6 kg), SpO2 100 %.   Resp: Lungs clear bilaterally Cardio: Regular rate and rhythm GI: No hepatosplenomegaly, mild tenderness in the right mid abdomen, no mass Vascular: No leg edema   Lab Results:  Lab Results  Component Value Date   WBC 6.9 09/30/2021   HGB 8.6 (L) 09/30/2021   HCT 28.1 (L) 09/30/2021   MCV 96.2 09/30/2021   PLT 261 09/30/2021   NEUTROABS 5.2 09/30/2021    CMP  Lab Results  Component Value Date   NA 139 09/30/2021   K 3.3 (L) 09/30/2021   CL 100 09/30/2021   CO2 30 09/30/2021   GLUCOSE 133 (H) 09/30/2021   BUN 40 (H) 09/30/2021   CREATININE 1.83 (H) 09/30/2021   CALCIUM 9.8 09/30/2021   PROT 7.6 09/30/2021    ALBUMIN 4.3 09/30/2021   AST 15 09/30/2021   ALT 10 09/30/2021   ALKPHOS 59 09/30/2021   BILITOT 0.4 09/30/2021   GFRNONAA 41 (L) 09/30/2021   GFRAA >60 03/05/2020    Lab Results  Component Value Date   CEA1 3.31 05/03/2019     Medications: I have reviewed the patient's current medications.   Assessment/Plan: History of Severe anemia-status post a red cell transfusion 03/04/2016 2.. Prostate cancer Extensive sclerotic bone metastases noted on a CT of the abdomen/pelvis 03/04/2016 and MRI abdomen 03/05/2016 Markedly elevated PSA Lupron initiated 03/15/2016; Casodex 14 days beginning 03/15/2016 Prostate biopsy 03/18/2016 05/09/2016 PSA significantly improved at 21 Abiraterone initiated 05/10/2016 PSA less than 0.1 07/10/2018 PSA less than 0.1 08/30/2018 PSA less than 0.1 10/18/2018 PSA less than 0.1 11/29/2018 PSA less than 0.1 on 11/06/2019  PSA less than 0.1 on 03/05/2020 PSA less than 0.1 on 07/06/2020   3.   Symptoms of obstructive uropathy. Improved   4.   Distal duodenal and descending colon masses biopsy of the duodenal mass 03/04/2016 confirmed fragments of small bowel mucosa with focal high-grade dysplasia Biopsy of the descending colon "polyps" revealed fragments of a tubulovillous adenoma CT 08/29/2017-similar soft tissue thickening in the region of the splenic flexure of the colon suboptimally evaluated due to lack of colonic enteric contrast Referred to Dr. Benson Norway Referred to [facility] Resection of descending colon polyps 01/31/2018, tubulovillous adenomas with high-grade dysplasia Attempted endoscopic resection  of the fourth segment duodenal "polyp" on 05/09/2018, firm unresectable lesion, biopsy revealed moderately differentiated adenocarcinoma CT abdomen/pelvis 05/25/2018- heterogeneously enhancing right lower pole renal lesion; diffuse sclerotic osseous metastatic lesions compatible with history of prostate cancer CT chest 05/25/2018- no pulmonary metastasis.  Diffuse  osseous metastasis involving the axial and appendicular skeleton. 06/19/2018-sleeve resection of third and fourth portions of duodenum with primary end-to-end anastomosis, feeding gastrojejunostomy tube placement, Dr. Cloyd Stagers; poorly differentiated adenocarcinoma duodenum, 3 cm; tumor invades the muscularis propria; lymphovascular invasion present; negative margins; 4 of 11 involved lymph nodes; soft tissue deposits also seen in the mesentery; all mucosal margins and soft tissue margins negative but 2 of the positive nodes are found in the soft tissue margin submitted for frozen section (pT2 pN2); second mucosal lesion 1.2 cm from the proximal margin, tubular adenoma with focal severe dysplasia Intact expression of mismatch repair proteins by IHC; microsatellite stable Cycle 1 adjuvant Xeloda 07/23/2018 Cycle 2 adjuvant Xeloda 08/13/2018 Cycle 3 adjuvant Xeloda 09/10/2018 (dose reduced to 1000 mg twice daily for 14 days)  Cycle 4 adjuvant Xeloda 10/01/2018 (dose increased to 1500 mg every morning and 1000 mg every afternoon for 14 days) Cycle 5 adjuvant Xeloda 10/22/2018 Cycle 6 adjuvant Xeloda 11/12/2018 Cycle 7 adjuvant Xeloda 12/03/2018 Cycle 8 adjuvant Xeloda 12/24/2018   5.  Anorexia/weight loss- resolved   6.  Undescended testicles   7.  Right renal mass.  CT 08/29/2017-right renal mass involving the lower pole was partially calcified and does demonstrate enhancement following contrast most consistent with renal cell carcinoma.  Lesion not significantly enlarged compared with the prior CT. Followed by urology. Renal ultrasound 07/06/2021-enlargement of right renal mass CT abdomen 09/28/2020-large minute heterogenously enhancing mass in the inferior pole the right kidney with new fullness of the inferior branch of the right renal vein, no enlarged abdominal lymph nodes, unchanged sclerotic osseous metastatic disease MRI abdomen 08/12/2021-heterogenously enhancing mass in the lower pole right kidney  extending into the central sinus fat and posteriorly has mass-effect on the right psoas muscle without definite invasion, tumor thrombus in the right renal vein extending to the IVC/RA junction, upper normal size retrocaval node   8.  Diabetes   9.  Anemia-stool Hemoccults + March 2021, referred to Dr. Benson Norway for endoscopic evaluation, anemia also secondary to metastatic prostate cancer, hypogonadism Small bowel enteroscopy 02/11/2020-esophagus normal.  Hematin found in the gastric body.  Examined duodenum normal.  No evidence of significant pathology in the entire examined portion of the jejunum.  There was no overt source of bleeding or inflammation.  The bleeding may be from small AVMs.  Surgical anastomosis was not visible with repeated inspection of the duodenum. Colonoscopy 02/11/2020-multiple sessile and semipedunculated polyps were found in the rectum, descending colon and ascending colon, 3 to 18 mm in size.  Polyps removed (polypectomy descending colon-fragments of tubular adenoma, inflammatory type polyp; polypectomy ascending colon-tubular adenoma, no high-grade dysplasia or malignancy; polypectomy descending colon-fragments of tubulovillous adenoma, no high-grade dysplasia or malignancy; polypectomy colon, rectum-tubular adenoma, no high-grade dysplasia or malignancy). Colonoscopy 02/02/2021-seven 2 to 10 mm polyps in the sigmoid colon (tubular adenoma), descending colon (fragments of tubulovillous adenoma, fragments of tubular adenoma ) and transverse colon (fragments of tubular adenoma). Progressive anemia 07/05/2021; stool cards positive Upper endoscopy/colonoscopy 09/23/2021-multiple polyps removed from the colon-tubular adenomas, diffuse angioectasias in the stomach with bleeding-not amenable to endoscopic treatment      Disposition: Brandon Peters has a history of metastatic prostate cancer, duodenal carcinoma, and a right renal mass.  He appears to have renal cell carcinoma extending to the  heart.  He has been evaluated at [facility] and is not a surgical candidate at present.  The [facility] team recommend systemic therapy with axitinib and pembrolizumab followed by a restaging evaluation to determine resectability.  We reviewed potential toxicities associated with axitinib including the chance of hematologic toxicity, rash, diarrhea, hypertension, bleeding, bowel perforation, and thromboembolic disease.  We discussed the rash, diarrhea, and various autoimmune toxicities seen with pembrolizumab.  He agrees to proceed.  The plan is to initiate treatment with axitinib and pembrolizumab on 10/14/2020.  We will communicate with the [facility] urologic oncology team regarding their plan to proceed with a renal biopsy.  A treatment plan was entered today.  [name], MD  09/30/2021  10:41 AM

## 2021-09-30 NOTE — Progress Notes (Signed)
Mr Rondeau saw Dr Benay Spice today in clinic and after clinic and consult with [facility] Dr Vito Berger and orders will be placed for renal biopsy CT guided at [facility]. Will continue to follow

## 2021-09-30 NOTE — Telephone Encounter (Signed)
Oral Oncology Patient Advocate Encounter   Received notification from [employer] that prior authorization for Inlyta is required.   PA submitted on CoverMyMeds on 10/01/21 Key [id number]  Status is pending   Oral Oncology Clinic will continue to follow.  [place] Patient [place] Phone [phone] Fax [phone]

## 2021-10-01 ENCOUNTER — Telehealth: Payer: Self-pay | Admitting: Pharmacy Technician

## 2021-10-01 LAB — PROSTATE-SPECIFIC AG, SERUM (LABCORP): Prostate Specific Ag, Serum: <0.1 ng/mL (ref 0.0–4.0)

## 2021-10-05 ENCOUNTER — Encounter (HOSPITAL_COMMUNITY): Payer: Self-pay

## 2021-10-05 NOTE — Progress Notes (Signed)
Arihant, Pennings Legal Sex  Male DOB  Nov 01, 1955 SSN  [id number] Address  [place] [address] Phone  [phone] [facility])  [phone] (Mobile)    RE: CT Biopsy Received: 4 days ago Criselda Peaches, MD  Valli Glance; P Ir Procedure Requests Approved for CT biopsy right lower pole renal mass.  There is tumor thrombus in renal veins, not an ablation candidate.   HKM        Previous Messages   ----- Message -----  From: Valli Glance  Sent: 09/30/2021   6:45 PM EST  To: Ir Procedure Requests  Subject: CT Biopsy                                       CT Biopsy      Reason: Renal cell carcinoma of right kidney       History: PT had a colonoscopy 12/ 29 at [facility],  MRI and [place] in computer       Provider: Ladell Pier       Contact: [phone]

## 2021-10-07 NOTE — Telephone Encounter (Signed)
Oral Oncology Patient Advocate Encounter  Received notification from [employer] that the request for prior authorization for Inlyta has been denied due to patient not have relapsed or Stage IV disease.     This determination is currently being appealed.  The appeal packet was faxed to [phone] on 10/06/21.   10/07/21: [facility] appeals dept faxed a request for additional information for the appeal. Information was faxed to Marlyne Beards at [phone] on 10/07/21.  This encounter will continue to be updated until final determination.    [place] Patient [place] Phone [phone] Fax [phone] 10/07/2021 2:05 PM

## 2021-10-08 ENCOUNTER — Other Ambulatory Visit (HOSPITAL_COMMUNITY): Payer: Self-pay

## 2021-10-08 ENCOUNTER — Encounter: Payer: Self-pay | Admitting: Oncology

## 2021-10-08 NOTE — Telephone Encounter (Signed)
Oral Oncology Patient Advocate Encounter  An urgent appeal for the prior authorization denial of Inlyta was faxed on 10/06/21.   Appeal packet has been faxed to [phone].    The insurance company states that a 80 hour turn around time is to be expected for urgent redeterminations of denial.   This encounter will continue to be updated until final appeal determination.    [place] Patient [place] Phone [phone] Fax [phone] 10/08/2021 9:47 AM

## 2021-10-08 NOTE — Telephone Encounter (Signed)
Oral Oncology Patient Advocate Encounter  Received phone message from [facility] that denial for Brandon Peters has been overturned and is now approved through the patients prescription plan.  Test claim revealed a copay of $3561.99.  I have obtained a grant to cover the patients out of pocket cost up to $6500 (PANF).  [place] Patient [place] Phone [phone] Fax [phone] 10/08/2021 9:49 AM

## 2021-10-10 ENCOUNTER — Other Ambulatory Visit: Payer: Self-pay | Admitting: Oncology

## 2021-10-11 ENCOUNTER — Other Ambulatory Visit (HOSPITAL_COMMUNITY): Payer: Self-pay

## 2021-10-12 ENCOUNTER — Other Ambulatory Visit: Payer: Self-pay | Admitting: Radiology

## 2021-10-13 ENCOUNTER — Ambulatory Visit (HOSPITAL_COMMUNITY)
Admission: RE | Admit: 2021-10-13 | Discharge: 2021-10-13 | Disposition: A | Discharge status: Home / Self Care | Payer: Medicare Other | Source: Ambulatory Visit | Attending: Oncology | Admitting: Oncology

## 2021-10-13 ENCOUNTER — Encounter: Payer: Self-pay | Admitting: Oncology

## 2021-10-13 ENCOUNTER — Other Ambulatory Visit: Payer: Self-pay

## 2021-10-13 ENCOUNTER — Other Ambulatory Visit (HOSPITAL_COMMUNITY): Payer: Self-pay

## 2021-10-13 ENCOUNTER — Encounter (HOSPITAL_COMMUNITY): Payer: Self-pay

## 2021-10-13 DIAGNOSIS — I10 Essential (primary) hypertension: Secondary | ICD-10-CM | POA: Diagnosis not present

## 2021-10-13 DIAGNOSIS — E785 Hyperlipidemia, unspecified: Secondary | ICD-10-CM | POA: Insufficient documentation

## 2021-10-13 DIAGNOSIS — C649 Malignant neoplasm of unspecified kidney, except renal pelvis: Secondary | ICD-10-CM | POA: Insufficient documentation

## 2021-10-13 DIAGNOSIS — Z85038 Personal history of other malignant neoplasm of large intestine: Secondary | ICD-10-CM | POA: Insufficient documentation

## 2021-10-13 DIAGNOSIS — C641 Malignant neoplasm of right kidney, except renal pelvis: Secondary | ICD-10-CM

## 2021-10-13 DIAGNOSIS — E119 Type 2 diabetes mellitus without complications: Secondary | ICD-10-CM | POA: Diagnosis not present

## 2021-10-13 DIAGNOSIS — Z8546 Personal history of malignant neoplasm of prostate: Secondary | ICD-10-CM | POA: Diagnosis not present

## 2021-10-13 LAB — CBC
HCT: 29.9 % — ABNORMAL LOW (ref 39.0–52.0)
Hemoglobin: 9.1 g/dL — ABNORMAL LOW (ref 13.0–17.0)
MCH: 29.8 pg (ref 26.0–34.0)
MCHC: 30.4 g/dL (ref 30.0–36.0)
MCV: 98 fL (ref 80.0–100.0)
Platelets: 291 10*3/uL (ref 150–400)
RBC: 3.05 MIL/uL — ABNORMAL LOW (ref 4.22–5.81)
RDW: 13.8 % (ref 11.5–15.5)
WBC: 9.7 10*3/uL (ref 4.0–10.5)
nRBC: 0 % (ref 0.0–0.2)

## 2021-10-13 LAB — PROTIME-INR
INR: 1 (ref 0.8–1.2)
Prothrombin Time: 12.9 seconds (ref 11.4–15.2)

## 2021-10-13 LAB — GLUCOSE, CAPILLARY: Glucose-Capillary: 123 mg/dL — ABNORMAL HIGH (ref 70–99)

## 2021-10-13 MED ORDER — GELATIN ABSORBABLE 12-7 MM EX MISC: 1.0000 | Freq: Once | CUTANEOUS | Status: DC

## 2021-10-13 MED ORDER — LIDOCAINE HCL 1 % IJ SOLN: 10.0000 mL | Freq: Once | INTRAMUSCULAR | Status: DC

## 2021-10-13 MED ORDER — GELATIN ABSORBABLE 12-7 MM EX MISC
CUTANEOUS | Status: AC
  Filled 2021-10-13: qty 1

## 2021-10-13 MED ORDER — MIDAZOLAM HCL 2 MG/2ML IJ SOLN
INTRAMUSCULAR | Status: AC
  Filled 2021-10-13: qty 4

## 2021-10-13 MED ORDER — FENTANYL CITRATE (PF) 100 MCG/2ML IJ SOLN
INTRAMUSCULAR | Status: AC
  Filled 2021-10-13: qty 4

## 2021-10-13 MED ORDER — LIDOCAINE HCL 1 % IJ SOLN
INTRAMUSCULAR | Status: AC
  Filled 2021-10-13: qty 10

## 2021-10-13 MED ORDER — SODIUM CHLORIDE 0.9 % IV SOLN: INTRAVENOUS | Status: DC

## 2021-10-13 MED ORDER — FENTANYL CITRATE (PF) 100 MCG/2ML IJ SOLN
INTRAMUSCULAR | Status: AC | PRN
  Administered 2021-10-13: 50 ug via INTRAVENOUS
  Administered 2021-10-13: 25 ug via INTRAVENOUS

## 2021-10-13 MED ORDER — LIDOCAINE HCL 1 % IJ SOLN
INTRAMUSCULAR | Status: AC | PRN
  Administered 2021-10-13: 10 mL

## 2021-10-13 MED ORDER — MIDAZOLAM HCL 2 MG/2ML IJ SOLN
INTRAMUSCULAR | Status: AC | PRN
  Administered 2021-10-13: .5 mg via INTRAVENOUS
  Administered 2021-10-13: 1 mg via INTRAVENOUS

## 2021-10-13 NOTE — H&P (Signed)
Chief Complaint: Patient was seen in consultation today for right renal mass  Referring Physician(s): Ladell Pier  Supervising Physician: Michaelle Birks  Patient Status: [facility] - Out-pt  History of Present Illness: Brandon Peters is a 66 y.o. male with past medical history of DM, HLD, HTN, medication-induce acute kidney injury now resolved, duodenal cancer s/p surgical resection, and metastatic prostate cancer currently on treatment. Patient was found to have a right renal mass in 2018 which was initially stable.  Recently however, the mass was noted to be enlarging by imaging. MRI 08/15/21 showed mass now 5.4 cm with invasion into the renal sinus fat and right renal vein with tumor thrombus extending from suprarenal IVC to the level of the inferior cavoatrial junction. The mass has not yet been biopsied as consultation for surgical resection of the mass was being considered.  Given the additional tumor burden, surgery has been deferred and plans made to proceed with chemotherapy and biopsy.   Patient presents to IR today in his usual state of health.  He reports mild baseline back pain on the right.  Denies fever, chills, nausea, vomiting, hematuria. He is understanding of the plan today and is agreeable to proceed.   Past Medical History:  Diagnosis Date   ARF (acute renal failure) (HCC)    d/t lisinopril and/or diazide- was on both in a 2 week period- cr up to 4.99   Diabetes mellitus    Family history of lung cancer    Hyperlipidemia    Hypertension    Personal history of other malignant neoplasm of small intestine    RENAL CALCULUS, HX OF 06/26/2007    Past Surgical History:  Procedure Laterality Date   COLONOSCOPY N/A 03/04/2016   Procedure: COLONOSCOPY;  Surgeon: Carol Ada, MD;  Location: [place];  Service: Endoscopy;  Laterality: N/A;   ENTEROSCOPY N/A 03/04/2016   Procedure: ENTEROSCOPY;  Surgeon: Carol Ada, MD;  Location: [facility] ENDOSCOPY;  Service: Endoscopy;   Laterality: N/A;    Allergies: Penicillins  Medications: Prior to Admission medications   Medication Sig Start Date End Date Taking? Authorizing Provider  abiraterone acetate (ZYTIGA) 250 MG tablet TAKE 4 TABLETS BY MOUTH ONCE DAILY AS DIRECTED.  TAKE 1 HOUR BEFORE OR 2 HOURS AFTER A MEAL 01/22/21  Yes Ladell Pier, MD  amLODipine (NORVASC) 10 MG tablet Take 10 mg by mouth daily. 04/21/16  Yes [provider]  aspirin EC 81 MG tablet Take 81 mg daily by mouth.   Yes [provider]  docusate sodium (COLACE) 100 MG capsule Take 100 mg by mouth daily as needed.   Yes [provider]  ferrous sulfate 325 (65 FE) MG EC tablet Take 1 tablet (325 mg total) by mouth 3 (three) times daily with meals. 11/06/19  Yes Ladell Pier, MD  hydrochlorothiazide (HYDRODIURIL) 25 MG tablet Take 25 mg by mouth daily. 07/27/21  Yes [provider]  metFORMIN (GLUCOPHAGE) 500 MG tablet Take 1 tablet (500 mg total) by mouth 2 (two) times daily with a meal. 03/06/16  Yes Norman Herrlich, MD  potassium chloride SA (KLOR-CON M) 20 MEQ tablet Take 1 tablet (20 mEq total) by mouth 2 (two) times daily. Changed 08/18/21 09/09/21  Yes Owens Shark, NP  predniSONE (DELTASONE) 5 MG tablet Take 1 tablet by mouth once daily with breakfast 08/25/21  Yes Ladell Pier, MD  rosuvastatin (CRESTOR) 10 MG tablet Take 10 mg by mouth every morning. 02/23/21  Yes [provider]  tamsulosin (FLOMAX) 0.4 MG CAPS capsule Take 0.4 mg by mouth at bedtime. 05/02/16  Yes [provider]  axitinib (INLYTA) 5 MG tablet Take 1 tablet (5 mg total) by mouth every 12 (twelve) hours. 10/14/21   Ladell Pier, MD  cetirizine (ZYRTEC) 10 MG tablet Take 10 mg by mouth daily as needed for allergies (takes as needed).     [provider]     Family History  Problem Relation Age of Onset   Diabetes Mother    Hypertension Mother    Coronary artery disease Mother    Alcohol abuse  Father    Hypertension Sister     Social History   Socioeconomic History   Marital status: Single    Spouse name: Not on file   Number of children: Not on file   Years of education: Not on file   Highest education level: Not on file  Occupational History   Not on file  Tobacco Use   Smoking status: Never   Smokeless tobacco: Never  Substance and Sexual Activity   Alcohol use: No   Drug use: No   Sexual activity: Not on file  Other Topics Concern   Not on file  Social History Narrative   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 Never married. Took college classes at [employer] --.got a Clinical biochemist degree from [place] Strain: Not on file  Food Insecurity: Not on file  Transportation Needs: Not on file  Physical Activity: Not on file  Stress: Not on file  Social Connections: Not on file     Review of Systems: A 12 point ROS discussed and pertinent positives are indicated in the HPI above.  All other systems are negative.  Review of Systems  Constitutional:  Negative for fatigue and fever.  Respiratory:  Negative for cough and shortness of breath.   Cardiovascular:  Negative for chest pain.  Gastrointestinal:  Negative for abdominal pain, diarrhea, nausea and vomiting.  Genitourinary:  Negative for hematuria.  Musculoskeletal:  Positive for back pain (mild right).  Psychiatric/Behavioral:  Negative for behavioral problems and confusion.    Vital Signs: BP (!) 164/86    Pulse 96    Temp 98.3 F (36.8 C) (Oral)    Resp 18    Ht 5\' 5"  (1.651 m)    Wt 150 lb (68 kg)    SpO2 98%    BMI 24.96 kg/m   Physical Exam Vitals and nursing note reviewed.  Constitutional:      General: He is not in acute distress.    Appearance: Normal appearance. He is not ill-appearing.  HENT:     Mouth/Throat:     Mouth: Mucous membranes are moist.     Pharynx: Oropharynx is clear.  Cardiovascular:      Rate and Rhythm: Normal rate and regular rhythm.  Pulmonary:     Effort: Pulmonary effort is normal.     Breath sounds: Normal breath sounds.  Abdominal:     General: Abdomen is flat.     Palpations: Abdomen is soft.  Skin:    General: Skin is warm and dry.  Neurological:     General: No focal deficit present.     Mental Status: He is alert and oriented to person, place, and time. Mental status is at baseline.  Psychiatric:        Mood and Affect: Mood normal.  Behavior: Behavior normal.        Thought Content: Thought content normal.        Judgment: Judgment normal.     MD Evaluation Airway: WNL Heart: WNL Abdomen: WNL Chest/ Lungs: WNL ASA  Classification: 3 Mallampati/Airway Score: Two   Imaging: No results found.  Labs:  CBC: Recent Labs    08/17/21 1014 09/09/21 1254 09/30/21 0938 10/13/21 0921  WBC 6.8 6.9 6.9 9.7  HGB 8.7* 8.5* 8.6* 9.1*  HCT 28.7* 28.2* 28.1* 29.9*  PLT 240 263 261 291    COAGS: Recent Labs    10/13/21 0921  INR 1.0    BMP: Recent Labs    08/17/21 1014 08/26/21 1311 09/09/21 1254 09/30/21 0938  NA 144 141 140 139  K 3.0* 3.9 3.3* 3.3*  CL 101 102 101 100  CO2 31 27 28 30   GLUCOSE 210* 201* 147* 133*  BUN 37* 39* 40* 40*  CALCIUM 10.6* 10.0 10.0 9.8  CREATININE 1.73* 1.72* 1.74* 1.83*  GFRNONAA 44* 44* 43* 41*    LIVER FUNCTION TESTS: Recent Labs    07/05/21 1004 08/26/21 1311 09/09/21 1254 09/30/21 0938  BILITOT 0.4 0.3 0.4 0.4  AST 13* 17 16 15   ALT 10 13 14 10   ALKPHOS 41 53 54 59  PROT 7.4 6.9 7.3 7.6  ALBUMIN 4.4 4.3 4.3 4.3    TUMOR MARKERS: No results for input(s): AFPTM, CEA, CA199, CHROMGRNA in the last 8760 hours.  Assessment and Plan: Patient with past medical history of duodenal cancer, metastatic prostate cancer presents with complaint of right renal mass.  IR consulted for right renal mass biopsy at the request of Dr. Benay Spice. Case reviewed by Dr. Maryelizabeth Kaufmann who approves patient for  procedure.  Patient presents today in their usual state of health.  He has been NPO and is not currently on blood thinners as he held his aspirin for 5 days. .   Risks and benefits of renal mass biopsy was discussed with the patient and/or patient's family including, but not limited to bleeding, infection, damage to adjacent structures or low yield requiring additional tests.  All of the questions were answered and there is agreement to proceed.  Consent signed and in chart.  Thank you for this interesting consult.  I greatly enjoyed meeting Brandon Peters and look forward to participating in their care.  A copy of this report was sent to the requesting provider on this date.  Electronically Signed: Docia Barrier, PA 10/13/2021, 11:28 AM   I spent a total of  30 Minutes   in face to face in clinical consultation, greater than 50% of which was counseling/coordinating care for right renal mass.

## 2021-10-13 NOTE — Telephone Encounter (Signed)
Oral Chemotherapy Pharmacist Encounter  [name] picked up his Inlyta from [facility] today. He knows to start taking his Inlyta tomorrow along with his infusion.  Patient Education I spoke with patient for overview of new oral chemotherapy medication: Inlyta (axitinib) for the neoadjuvant treatment of RCC in conjunction with pembrolizumab, planned duration of 3 cycles. Planned start 10/14/20.   Counseled patient on administration, dosing, side effects, monitoring, drug-food interactions, safe handling, storage, and disposal. Patient will take 1 tablet (5 mg total) by mouth every 12 (twelve) hour.  Side effects include but not limited to: increased blood pressure, diarrhea, hand-foot syndrome.   Increased blood pressure: plans to pick up blood pressure cuff tomorrow. He received the blood pressure log I mailed to him and will use it to record his blood pressure home readings Diarrhea: plans to pick up loperamide to have on hand to use as needed. He knows to call if he has 4 for more loose stools per day. Hand-foot syndrome: Patient knows to keep his hands and feet moisturized. Suggested he pick up [place] Smooth Extra Care 20.   Reviewed with patient importance of keeping a medication schedule and plan for any missed doses.  After discussion with patient no patient barriers to medication adherence identified.   [name] voiced understanding and appreciation. All questions answered. Medication handout provided.  Provided patient with [facility] phone number. Patient knows to call the office with questions or concerns. Oral Chemotherapy Navigation [facility] will continue to follow.  Darl Pikes, PharmD, BCPS, BCOP, CPP Hematology/Oncology Clinical Pharmacist Practitioner [place]/DB/AP [facility] [phone]  10/13/2021 4:57 PM

## 2021-10-13 NOTE — Procedures (Signed)
Vascular and Interventional Radiology Procedure Note  Patient: Rohin Krejci DOB: Sep 11, 1956 Medical Record Number: [id number] Note Date/Time: 10/13/21 12:23 PM   Performing Physician: Michaelle Birks, MD Assistant(s): None  Diagnosis: R renal mass.  Procedure: RIGHT RENAL MASS BIOPSY  Anesthesia: Conscious Sedation Complications: None Estimated Blood Loss: Minimal Specimens: Sent for Pathology  Findings:  Successful CT-guided biopsy of R renal mass. A total of 3 samples were obtained. Hemostasis of the tract was achieved using Gelfoam Slurry Embolization.  Plan: Bed rest for 2 hours.  See detailed procedure note with images in PACS. The patient tolerated the procedure well without incident or complication and was returned to Recovery in stable condition.    Michaelle Birks, MD Vascular and Interventional Radiology Specialists [facility] Radiology   Pager. [place]

## 2021-10-14 ENCOUNTER — Inpatient Hospital Stay: Payer: Medicare Other

## 2021-10-14 ENCOUNTER — Inpatient Hospital Stay: Payer: Medicare Other | Admitting: Nurse Practitioner

## 2021-10-14 ENCOUNTER — Encounter: Payer: Self-pay | Admitting: Nurse Practitioner

## 2021-10-14 VITALS — BP 123/69 | HR 81 | Temp 98.7°F | Resp 18 | Ht 65.0 in | Wt 149.8 lb

## 2021-10-14 DIAGNOSIS — C179 Malignant neoplasm of small intestine, unspecified: Secondary | ICD-10-CM

## 2021-10-14 DIAGNOSIS — C61 Malignant neoplasm of prostate: Secondary | ICD-10-CM | POA: Diagnosis not present

## 2021-10-14 DIAGNOSIS — Z5112 Encounter for antineoplastic immunotherapy: Secondary | ICD-10-CM | POA: Diagnosis not present

## 2021-10-14 DIAGNOSIS — C641 Malignant neoplasm of right kidney, except renal pelvis: Secondary | ICD-10-CM | POA: Diagnosis not present

## 2021-10-14 LAB — CMP (CANCER CENTER ONLY)
ALT: 11 U/L (ref 0–44)
AST: 20 U/L (ref 15–41)
Albumin: 4 g/dL (ref 3.5–5.0)
Alkaline Phosphatase: 58 U/L (ref 38–126)
Anion gap: 12 (ref 5–15)
BUN: 34 mg/dL — ABNORMAL HIGH (ref 8–23)
CO2: 26 mmol/L (ref 22–32)
Calcium: 9.3 mg/dL (ref 8.9–10.3)
Chloride: 101 mmol/L (ref 98–111)
Creatinine: 1.73 mg/dL — ABNORMAL HIGH (ref 0.61–1.24)
GFR, Estimated: 44 mL/min — ABNORMAL LOW (ref >60)
Glucose, Bld: 158 mg/dL — ABNORMAL HIGH (ref 70–99)
Potassium: 3.1 mmol/L — ABNORMAL LOW (ref 3.5–5.1)
Sodium: 139 mmol/L (ref 135–145)
Total Bilirubin: 0.6 mg/dL (ref 0.3–1.2)
Total Protein: 7.1 g/dL (ref 6.5–8.1)

## 2021-10-14 LAB — CBC WITH DIFFERENTIAL (CANCER CENTER ONLY)
Abs Immature Granulocytes: 0.02 10*3/uL (ref 0.00–0.07)
Basophils Absolute: 0 10*3/uL (ref 0.0–0.1)
Basophils Relative: 0 %
Eosinophils Absolute: 0.1 10*3/uL (ref 0.0–0.5)
Eosinophils Relative: 1 %
HCT: 26.7 % — ABNORMAL LOW (ref 39.0–52.0)
Hemoglobin: 8.1 g/dL — ABNORMAL LOW (ref 13.0–17.0)
Immature Granulocytes: 0 %
Lymphocytes Relative: 14 %
Lymphs Abs: 1.1 10*3/uL (ref 0.7–4.0)
MCH: 29.1 pg (ref 26.0–34.0)
MCHC: 30.3 g/dL (ref 30.0–36.0)
MCV: 96 fL (ref 80.0–100.0)
Monocytes Absolute: 0.8 10*3/uL (ref 0.1–1.0)
Monocytes Relative: 10 %
Neutro Abs: 5.9 10*3/uL (ref 1.7–7.7)
Neutrophils Relative %: 75 %
Platelet Count: 245 10*3/uL (ref 150–400)
RBC: 2.78 MIL/uL — ABNORMAL LOW (ref 4.22–5.81)
RDW: 13.8 % (ref 11.5–15.5)
WBC Count: 7.9 10*3/uL (ref 4.0–10.5)
nRBC: 0 % (ref 0.0–0.2)

## 2021-10-14 LAB — SAMPLE TO BLOOD BANK

## 2021-10-14 LAB — TSH: TSH: 1.291 u[IU]/mL (ref 0.350–4.500)

## 2021-10-14 NOTE — Progress Notes (Signed)
[place] OFFICE PROGRESS NOTE   Diagnosis: Prostate cancer, small bowel carcinoma, renal cell carcinoma  INTERVAL HISTORY:   Brandon Peters returns as scheduled.  He underwent biopsy of the right renal mass 10/13/2021.  Some back pain after the biopsy.  Better today.  No nausea or vomiting.  No diarrhea.  No rash.  Stools dark since beginning oral iron.  Objective:  Vital signs in last 24 hours:  Blood pressure 123/69, pulse 81, temperature 98.7 F (37.1 C), temperature source Oral, resp. rate 18, height _0  (1.651 m), weight 149 lb 12.8 oz (67.9 kg), SpO2 100 %.    HEENT: No thrush or ulcers. Resp: Lungs clear bilaterally. Cardio: Regular rate and rhythm. GI: Abdomen soft and nontender.  No hepatosplenomegaly.  No mass. Vascular: No leg edema. Skin: Small ecchymosis right lower back biopsy site.   Lab Results:  Lab Results  Component Value Date   WBC 7.9 10/14/2021   HGB 8.1 (L) 10/14/2021   HCT 26.7 (L) 10/14/2021   MCV 96.0 10/14/2021   PLT 245 10/14/2021   NEUTROABS 5.9 10/14/2021    Imaging:  CT RENAL BIOPSY  Result Date: 10/14/2021 INDICATION: RIGHT renal mass EXAM: CT BIOPSY CORE RENAL Procedure; CT-GUIDED RIGHT RENAL MASS BIOPSY COMPARISON:  MRI abdomen, 08/12/2021. US Abdomen, 07/06/2021. CT AP, 07/29/2021. MEDICATIONS: None. ANESTHESIA/SEDATION: Fentanyl 1.5 mcg IV; Versed 75 mg IV Sedation time: 21 minutes; The patient was continuously monitored during the procedure by the interventional radiology nurse under my direct supervision. CONTRAST:  None. COMPLICATIONS: None immediate. PROCEDURE: Informed consent was obtained from the patient following an explanation of the procedure, risks, benefits and alternatives. A time out was performed prior to the initiation of the procedure. The patient was positioned prone on the CT table and a limited CT was performed for procedural planning demonstrating RIGHT inferior pole renal mass. The procedure was  planned. The operative site was prepped and draped in the usual sterile fashion. Appropriate trajectory was confirmed with a 22 gauge spinal needle after the adjacent tissues were anesthetized with 1% Lidocaine with epinephrine. Under intermittent CT guidance, a 17 gauge coaxial needle was advanced into the peripheral aspect of the mass. Appropriate positioning was confirmed and 3 samples were obtained with an 18 gauge core needle biopsy device. The co-axial needle was removed and hemostasis was achieved with Gelfoam slurry tract injection. A limited postprocedural CT was negative for hemorrhage or additional complication. A dressing was placed. The patient tolerated the procedure well without immediate postprocedural complication. IMPRESSION: Successful CT-guided core needle biopsy of RIGHT renal mass. Brandon Birks, MD Vascular and Interventional Radiology Specialists [facility] Radiology Electronically Signed   By: Brandon Peters M.D.   On: 10/14/2021 10:44    Medications: I have reviewed the patient's current medications.  Assessment/Plan:  History of Severe anemia-status post a red cell transfusion 03/04/2016 2.. Prostate cancer Extensive sclerotic bone metastases noted on a CT of the abdomen/pelvis 03/04/2016 and MRI abdomen 03/05/2016 Markedly elevated PSA Lupron initiated 03/15/2016; Casodex 14 days beginning 03/15/2016 Prostate biopsy 03/18/2016 05/09/2016 PSA significantly improved at 21 Abiraterone initiated 05/10/2016 PSA less than 0.1 07/10/2018 PSA less than 0.1 08/30/2018 PSA less than 0.1 10/18/2018 PSA less than 0.1 11/29/2018 PSA less than 0.1 on 11/06/2019  PSA less than 0.1 on 03/05/2020 PSA less than 0.1 on 07/06/2020   3.   Symptoms of obstructive uropathy. Improved   4.   Distal duodenal and descending colon masses biopsy of the duodenal mass 03/04/2016 confirmed fragments  of small bowel mucosa with focal high-grade dysplasia Biopsy of the descending colon "polyps" revealed  fragments of a tubulovillous adenoma CT 08/29/2017-similar soft tissue thickening in the region of the splenic flexure of the colon suboptimally evaluated due to lack of colonic enteric contrast Referred to Brandon Peters Referred to [facility] Resection of descending colon polyps 01/31/2018, tubulovillous adenomas with high-grade dysplasia Attempted endoscopic resection of the fourth segment duodenal "polyp" on 05/09/2018, firm unresectable lesion, biopsy revealed moderately differentiated adenocarcinoma CT abdomen/pelvis 05/25/2018- heterogeneously enhancing right lower pole renal lesion; diffuse sclerotic osseous metastatic lesions compatible with history of prostate cancer CT chest 05/25/2018- no pulmonary metastasis.  Diffuse osseous metastasis involving the axial and appendicular skeleton. 06/19/2018-sleeve resection of third and fourth portions of duodenum with primary end-to-end anastomosis, feeding gastrojejunostomy tube placement, Dr. Cloyd Stagers; poorly differentiated adenocarcinoma duodenum, 3 cm; tumor invades the muscularis propria; lymphovascular invasion present; negative margins; 4 of 11 involved lymph nodes; soft tissue deposits also seen in the mesentery; all mucosal margins and soft tissue margins negative but 2 of the positive nodes are found in the soft tissue margin submitted for frozen section (pT2 pN2); second mucosal lesion 1.2 cm from the proximal margin, tubular adenoma with focal severe dysplasia Intact expression of mismatch repair proteins by IHC; microsatellite stable Cycle 1 adjuvant Xeloda 07/23/2018 Cycle 2 adjuvant Xeloda 08/13/2018 Cycle 3 adjuvant Xeloda 09/10/2018 (dose reduced to 1000 mg twice daily for 14 days)  Cycle 4 adjuvant Xeloda 10/01/2018 (dose increased to 1500 mg every morning and 1000 mg every afternoon for 14 days) Cycle 5 adjuvant Xeloda 10/22/2018 Cycle 6 adjuvant Xeloda 11/12/2018 Cycle 7 adjuvant Xeloda 12/03/2018 Cycle 8 adjuvant Xeloda 12/24/2018   5.  Anorexia/weight  loss- resolved   6.  Undescended testicles   7.  Right renal mass.  CT 08/29/2017-right renal mass involving the lower pole was partially calcified and does demonstrate enhancement following contrast most consistent with renal cell carcinoma.  Lesion not significantly enlarged compared with the prior CT. Followed by urology. 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 to the IVC/RA junction, upper normal size retrocaval node Biopsy right renal mass 10/13/2021   8.  Diabetes   9.  Anemia-stool Hemoccults + March 2021, referred to Brandon Peters for endoscopic evaluation, anemia also secondary to metastatic prostate cancer, hypogonadism Small bowel enteroscopy 02/11/2020-esophagus normal.  Hematin found in the gastric body.  Examined duodenum normal.  No evidence of significant pathology in the entire examined portion of the jejunum.  There was no overt source of bleeding or inflammation.  The bleeding may be from small AVMs.  Surgical anastomosis was not visible with repeated inspection of the duodenum. Colonoscopy 02/11/2020-multiple sessile and semipedunculated polyps were found in the rectum, descending colon and ascending colon, 3 to 18 mm in size.  Polyps removed (polypectomy descending colon-fragments of tubular adenoma, inflammatory type polyp; polypectomy ascending colon-tubular adenoma, no high-grade dysplasia or malignancy; polypectomy descending colon-fragments of tubulovillous adenoma, no  high-grade dysplasia or malignancy; polypectomy colon, rectum-tubular adenoma, no high-grade dysplasia or malignancy). Colonoscopy 02/02/2021-seven 2 to 10 mm polyps in the  sigmoid colon (tubular adenoma), descending colon (fragments of tubulovillous adenoma, fragments of tubular adenoma ) and transverse colon (fragments of tubular adenoma). 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  He underwent a biopsy of the right renal mass yesterday.  Pathology is pending.  We decided to adjust the start date for Pembrolizumab and axitinib to next week until the biopsy result is available.  He agrees with this plan.  CBC and chemistry panel reviewed.  Stable persistent anemia.  Continued hypokalemia.  He will increase the potassium supplement to 3 times a day.  He will return for lab, follow-up, Pembrolizumab in 1 week.  He will contact the office in the interim with any problems.  Patient seen with Dr. Benay Spice.      Ned Card ANP/GNP-BC   10/14/2021  10:47 AM  This was a shared visit with Ned Card.  Brandon Peters underwent a renal biopsy yesterday and the pathology is pending.  I discussed the case with the medical oncology service at [facility].  I discussed the case with Brandon Peters.  The plan is to begin axitinib/pembrolizumab if the biopsy confirms clear-cell carcinoma.  We discussed the bleeding risk associated with axitinib in the setting of angioectasias of the stomach.  Brandon Peters agrees to proceed.  The plan is to initiate axitinib/pembrolizumab in 1 week if the biopsy confirms clear-cell carcinoma.  I was present for greater than 50% of today's visit.  I performed medical decision making.   Julieanne Manson, MD

## 2021-10-15 LAB — SURGICAL PATHOLOGY

## 2021-10-20 ENCOUNTER — Encounter: Payer: Self-pay | Admitting: Nurse Practitioner

## 2021-10-21 NOTE — Progress Notes (Signed)
Pharmacist Chemotherapy Monitoring - Initial Assessment    Anticipated start date: 10/22/21   The following has been reviewed per standard work regarding the patient's treatment regimen: The patient's diagnosis, treatment plan and drug doses, and organ/hematologic function Lab orders and baseline tests specific to treatment regimen  The treatment plan start date, drug sequencing, and pre-medications Prior authorization status  Patient's documented medication list, including drug-drug interaction screen and prescriptions for anti-emetics and supportive care specific to the treatment regimen The drug concentrations, fluid compatibility, administration routes, and timing of the medications to be used The patient's access for treatment and lifetime cumulative dose history, if applicable  The patient's medication allergies and previous infusion related reactions, if applicable   Changes made to treatment plan:  treatment plan date- patient did not receive last week  Follow up needed:  N/A   Patrica Duel, [facility], 10/21/2021  3:13 PM

## 2021-10-22 ENCOUNTER — Other Ambulatory Visit: Payer: Self-pay | Admitting: *Deleted

## 2021-10-22 ENCOUNTER — Inpatient Hospital Stay: Payer: Medicare Other | Admitting: Oncology

## 2021-10-22 ENCOUNTER — Inpatient Hospital Stay: Payer: Medicare Other

## 2021-10-22 ENCOUNTER — Other Ambulatory Visit: Payer: Self-pay

## 2021-10-22 ENCOUNTER — Encounter: Payer: Self-pay | Admitting: *Deleted

## 2021-10-22 VITALS — BP 119/71 | HR 99 | Temp 97.8°F | Resp 18 | Ht 65.0 in | Wt 144.8 lb

## 2021-10-22 VITALS — BP 124/72 | HR 72 | Temp 98.5°F | Resp 20

## 2021-10-22 DIAGNOSIS — Z5112 Encounter for antineoplastic immunotherapy: Secondary | ICD-10-CM | POA: Diagnosis not present

## 2021-10-22 DIAGNOSIS — C641 Malignant neoplasm of right kidney, except renal pelvis: Secondary | ICD-10-CM

## 2021-10-22 DIAGNOSIS — E876 Hypokalemia: Secondary | ICD-10-CM

## 2021-10-22 LAB — CMP (CANCER CENTER ONLY)
ALT: 10 U/L (ref 0–44)
AST: 15 U/L (ref 15–41)
Albumin: 4.1 g/dL (ref 3.5–5.0)
Alkaline Phosphatase: 73 U/L (ref 38–126)
Anion gap: 12 (ref 5–15)
BUN: 48 mg/dL — ABNORMAL HIGH (ref 8–23)
CO2: 27 mmol/L (ref 22–32)
Calcium: 9.7 mg/dL (ref 8.9–10.3)
Chloride: 103 mmol/L (ref 98–111)
Creatinine: 2.37 mg/dL — ABNORMAL HIGH (ref 0.61–1.24)
GFR, Estimated: 30 mL/min — ABNORMAL LOW (ref >60)
Glucose, Bld: 164 mg/dL — ABNORMAL HIGH (ref 70–99)
Potassium: 2.9 mmol/L — ABNORMAL LOW (ref 3.5–5.1)
Sodium: 142 mmol/L (ref 135–145)
Total Bilirubin: 0.4 mg/dL (ref 0.3–1.2)
Total Protein: 7.4 g/dL (ref 6.5–8.1)

## 2021-10-22 LAB — CBC WITH DIFFERENTIAL (CANCER CENTER ONLY)
Abs Immature Granulocytes: 0.03 10*3/uL (ref 0.00–0.07)
Basophils Absolute: 0 10*3/uL (ref 0.0–0.1)
Basophils Relative: 0 %
Eosinophils Absolute: 0.1 10*3/uL (ref 0.0–0.5)
Eosinophils Relative: 1 %
HCT: 28.1 % — ABNORMAL LOW (ref 39.0–52.0)
Hemoglobin: 8.4 g/dL — ABNORMAL LOW (ref 13.0–17.0)
Immature Granulocytes: 0 %
Lymphocytes Relative: 10 %
Lymphs Abs: 0.7 10*3/uL (ref 0.7–4.0)
MCH: 28.3 pg (ref 26.0–34.0)
MCHC: 29.9 g/dL — ABNORMAL LOW (ref 30.0–36.0)
MCV: 94.6 fL (ref 80.0–100.0)
Monocytes Absolute: 0.4 10*3/uL (ref 0.1–1.0)
Monocytes Relative: 6 %
Neutro Abs: 5.8 10*3/uL (ref 1.7–7.7)
Neutrophils Relative %: 83 %
Platelet Count: 306 10*3/uL (ref 150–400)
RBC: 2.97 MIL/uL — ABNORMAL LOW (ref 4.22–5.81)
RDW: 13.2 % (ref 11.5–15.5)
WBC Count: 7 10*3/uL (ref 4.0–10.5)
nRBC: 0 % (ref 0.0–0.2)

## 2021-10-22 LAB — MAGNESIUM: Magnesium: 2.4 mg/dL (ref 1.7–2.4)

## 2021-10-22 MED ORDER — POTASSIUM CHLORIDE 10 MEQ/100ML IV SOLN
10.0000 meq | INTRAVENOUS | Status: AC
  Administered 2021-10-22 (×2): 10 meq via INTRAVENOUS
  Filled 2021-10-22: qty 100

## 2021-10-22 MED ORDER — SODIUM CHLORIDE 0.9 % IV SOLN: Freq: Once | INTRAVENOUS | Status: AC

## 2021-10-22 MED ORDER — SODIUM CHLORIDE 0.9 % IV SOLN
200.0000 mg | Freq: Once | INTRAVENOUS | Status: AC
  Administered 2021-10-22: 200 mg via INTRAVENOUS
  Filled 2021-10-22: qty 8

## 2021-10-22 NOTE — Progress Notes (Signed)
Patient seen by Dr. Benay Spice today  Vitals are within treatment parameters.  Labs reviewed by Dr. Benay Spice and are not all within treatment parameters. OK to treat today with K+ 2.9 and Creatinine 2.37. Adding Mg+ level to determine how to manage the K+ replacement.  Per physician team, patient is ready for treatment and there are NO modifications to the treatment plan.

## 2021-10-22 NOTE — Progress Notes (Signed)
[facility] OFFICE PROGRESS NOTE   Diagnosis: Prostate cancer, renal cell cancer  INTERVAL HISTORY:   [facility] Peters returns as scheduled.  He underwent a renal biopsy 10/13/2021.  He reports tolerating procedure well, but he had nausea and anorexia for several days following the biopsy.  No bleeding. He is taking potassium.  He continues Zytiga and prednisone.  Objective:  Vital signs in last 24 hours:  Blood pressure 119/71, pulse 99, temperature 97.8 F (36.6 C), temperature source Oral, resp. rate 18, height '5\' 5"'  (1.651 m), weight 144 lb 12.8 oz (65.7 kg), SpO2 100 %.    Resp: Lungs clear bilaterally Cardio: Regular rate and rhythm GI: No mass, no hepatosplenomegaly, mild tenderness in the right lateral abdomen Vascular: No leg edema Skin: No ecchymosis at the right flank biopsy site   Lab Results:  Lab Results  Component Value Date   WBC 7.0 10/22/2021   HGB 8.4 (L) 10/22/2021   HCT 28.1 (L) 10/22/2021   MCV 94.6 10/22/2021   PLT 306 10/22/2021   NEUTROABS 5.8 10/22/2021    CMP  Lab Results  Component Value Date   NA 142 10/22/2021   K 2.9 (L) 10/22/2021   CL 103 10/22/2021   CO2 27 10/22/2021   GLUCOSE 164 (H) 10/22/2021   BUN 48 (H) 10/22/2021   CREATININE 2.37 (H) 10/22/2021   CALCIUM 9.7 10/22/2021   PROT 7.4 10/22/2021   ALBUMIN 4.1 10/22/2021   AST 15 10/22/2021   ALT 10 10/22/2021   ALKPHOS 73 10/22/2021   BILITOT 0.4 10/22/2021   GFRNONAA 30 (L) 10/22/2021   GFRAA >60 03/05/2020    Lab Results  Component Value Date   CEA1 3.31 05/03/2019     Medications: I have reviewed the patient's current medications.   Assessment/Plan:  History of Severe anemia-status post a red cell transfusion 03/04/2016 2.. Prostate cancer Extensive sclerotic bone metastases noted on a CT of the abdomen/pelvis 03/04/2016 and MRI abdomen 03/05/2016 Markedly elevated PSA Lupron initiated 03/15/2016; Casodex 14 days beginning 03/15/2016 Prostate  biopsy 03/18/2016 05/09/2016 PSA significantly improved at 21 Abiraterone initiated 05/10/2016 PSA less than 0.1 07/10/2018 PSA less than 0.1 08/30/2018 PSA less than 0.1 10/18/2018 PSA less than 0.1 11/29/2018 PSA less than 0.1 on 11/06/2019  PSA less than 0.1 on 03/05/2020 PSA less than 0.1 on 07/06/2020   3.   Symptoms of obstructive uropathy. Improved   4.   Distal duodenal and descending colon masses biopsy of the duodenal mass 03/04/2016 confirmed fragments of small bowel mucosa with focal high-grade dysplasia Biopsy of the descending colon "polyps" revealed fragments of a tubulovillous adenoma CT 08/29/2017-similar soft tissue thickening in the region of the splenic flexure of the colon suboptimally evaluated due to lack of colonic enteric contrast Referred to Dr. Benson Norway Referred to [facility] Resection of descending colon polyps 01/31/2018, tubulovillous adenomas with high-grade dysplasia Attempted endoscopic resection of the fourth segment duodenal "polyp" on 05/09/2018, firm unresectable lesion, biopsy revealed moderately differentiated adenocarcinoma CT abdomen/pelvis 05/25/2018- heterogeneously enhancing right lower pole renal lesion; diffuse sclerotic osseous metastatic lesions compatible with history of prostate cancer CT chest 05/25/2018- no pulmonary metastasis.  Diffuse osseous metastasis involving the axial and appendicular skeleton. 06/19/2018-sleeve resection of third and fourth portions of duodenum with primary end-to-end anastomosis, feeding gastrojejunostomy tube placement, Dr. Cloyd Stagers; poorly differentiated adenocarcinoma duodenum, 3 cm; tumor invades the muscularis propria; lymphovascular invasion present; negative margins; 4 of 11 involved lymph nodes; soft tissue deposits also seen in the mesentery; all mucosal  margins and soft tissue margins negative but 2 of the positive nodes are found in the soft tissue margin submitted for frozen section (pT2 pN2); second mucosal lesion 1.2 cm from the  proximal margin, tubular adenoma with focal severe dysplasia Intact expression of mismatch repair proteins by IHC; microsatellite stable Cycle 1 adjuvant Xeloda 07/23/2018 Cycle 2 adjuvant Xeloda 08/13/2018 Cycle 3 adjuvant Xeloda 09/10/2018 (dose reduced to 1000 mg twice daily for 14 days)  Cycle 4 adjuvant Xeloda 10/01/2018 (dose increased to 1500 mg every morning and 1000 mg every afternoon for 14 days) Cycle 5 adjuvant Xeloda 10/22/2018 Cycle 6 adjuvant Xeloda 11/12/2018 Cycle 7 adjuvant Xeloda 12/03/2018 Cycle 8 adjuvant Xeloda 12/24/2018   5.  Anorexia/weight loss- resolved   6.  Undescended testicles   7.  Right renal mass.  CT 08/29/2017-right renal mass involving the lower pole was partially calcified and does demonstrate enhancement following contrast most consistent with renal cell carcinoma.  Lesion not significantly enlarged compared with the prior CT. Followed by urology. 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 to the IVC/RA junction, upper normal size retrocaval node 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  Diabetes   9.  Anemia-stool Hemoccults + March 2021, referred to Dr. Benson Norway for endoscopic evaluation, anemia also secondary to metastatic prostate cancer, hypogonadism Small bowel enteroscopy 02/11/2020-esophagus normal.  Hematin found in the gastric body.  Examined duodenum normal.  No  evidence of significant pathology in the entire examined portion of the jejunum.  There was no overt source of bleeding or inflammation.  The bleeding may be from small AVMs.  Surgical anastomosis was not visible with repeated inspection of the duodenum. Colonoscopy 02/11/2020-multiple sessile and semipedunculated polyps were found in the rectum, descending colon and ascending colon, 3 to 18 mm in size.  Polyps removed (polypectomy descending colon-fragments of tubular adenoma, inflammatory type polyp; polypectomy ascending colon-tubular adenoma, no high-grade dysplasia or malignancy; polypectomy descending colon-fragments of tubulovillous adenoma, no high-grade dysplasia or malignancy; polypectomy colon, rectum-tubular adenoma, no high-grade dysplasia or malignancy). Colonoscopy 02/02/2021-seven 2 to 10 mm polyps in the sigmoid colon (tubular adenoma), descending colon (fragments of tubulovillous adenoma, fragments of tubular adenoma ) and transverse colon (fragments of tubular adenoma). Progressive anemia 07/05/2021; stool cards positive Upper endoscopy/colonoscopy 09/23/2021-multiple polyps removed from the colon-tubular adenomas, diffuse angioectasias in the stomach with bleeding-not amenable to endoscopic treatment    Disposition: Brandon Peters appears stable.  He has been diagnosed with clear-cell renal cell carcinoma.  The plan is to begin axitinib/pembrolizumab today.  I reviewed potential toxicities associated with this regimen.  He agrees to proceed.  He has stable anemia secondary to GI/GU blood loss and chronic disease.  The creatinine is higher today.  He has hypokalemia.  We will check the magnesium level today.  We will replete the electrolytes as indicated.  He will return for an office and lab visit in 2 weeks.  [name], MD  10/22/2021  11:18 AM

## 2021-10-22 NOTE — Progress Notes (Signed)
Patient presents for treatment. RN assessment completed along with the following:  Labs/vitals reviewed - Yes, and Per Merceda Elks, RN's note, ok given by Dr. Benay Spice to proceed with treatment with K, pending Mg, and Scr.  Weight within 10% of previous measurement - Yes Informed consent completed and reflects current therapy/intent - Yes, on date 10/22/21             Provider progress note reviewed - Yes, today's provider note was reviewed. Treatment/Antibody/Supportive plan reviewed - Yes, and there are no adjustments needed for today's treatment. S&H and other orders reviewed - Yes, and there are no additional orders identified. Previous treatment date reviewed - Yes, and the appropriate amount of time has elapsed between treatments. Clinic Hand Off Received from - Merceda Elks, RN  Patient to proceed with treatment.

## 2021-10-22 NOTE — Patient Instructions (Addendum)
Your potassium level is too low. Increase your oral potassium to three times daily and stop your diuretic HCTZ.  [facility]  Discharge Instructions: Thank you for choosing [facility] to provide your oncology and hematology care.   If you have a lab appointment with the [facility], please go directly to the [facility] and check in at the registration area.   Wear comfortable clothing and clothing appropriate for easy access to any Portacath or PICC line.   We strive to give you quality time with your provider. You may need to reschedule your appointment if you arrive late (15 or more minutes).  Arriving late affects you and other patients whose appointments are after yours.  Also, if you miss three or more appointments without notifying the office, you may be dismissed from the clinic at the providers discretion.      For prescription refill requests, have your pharmacy contact our office and allow 72 hours for refills to be completed.    Today you received the following chemotherapy and/or immunotherapy agents Pembrolizumab.  Pembrolizumab injection What is this medication? PEMBROLIZUMAB (pem broe liz ue mab) is a monoclonal antibody. It is used to treat certain types of cancer. This medicine may be used for other purposes; ask your health care provider or pharmacist if you have questions. COMMON BRAND NAME(S): Keytruda What should I tell my care team before I take this medication? They need to know if you have any of these conditions: autoimmune diseases like Crohn's disease, ulcerative colitis, or lupus have had or planning to have an allogeneic stem cell transplant (uses someone else's stem cells) history of organ transplant history of chest radiation nervous system problems like myasthenia gravis or Guillain-Barre syndrome an unusual or allergic reaction to pembrolizumab, other medicines, foods, dyes, or preservatives pregnant or  trying to get pregnant breast-feeding How should I use this medication? This medicine is for infusion into a vein. It is given by a health care professional in a hospital or clinic setting. A special MedGuide will be given to you before each treatment. Be sure to read this information carefully each time. Talk to your pediatrician regarding the use of this medicine in children. While this drug may be prescribed for children as young as 6 months for selected conditions, precautions do apply. Overdosage: If you think you have taken too much of this medicine contact a poison control center or emergency room at once. NOTE: This medicine is only for you. Do not share this medicine with others. What if I miss a dose? It is important not to miss your dose. Call your doctor or health care professional if you are unable to keep an appointment. What may interact with this medication? Interactions have not been studied. This list may not describe all possible interactions. Give your health care provider a list of all the medicines, herbs, non-prescription drugs, or dietary supplements you use. Also tell them if you smoke, drink alcohol, or use illegal drugs. Some items may interact with your medicine. What should I watch for while using this medication? Your condition will be monitored carefully while you are receiving this medicine. You may need blood work done while you are taking this medicine. Do not become pregnant while taking this medicine or for 4 months after stopping it. Women should inform their doctor if they wish to become pregnant or think they might be pregnant. There is a potential for serious side effects to an unborn  child. Talk to your health care professional or pharmacist for more information. Do not breast-feed an infant while taking this medicine or for 4 months after the last dose. What side effects may I notice from receiving this medication? Side effects that you should report to  your doctor or health care professional as soon as possible: allergic reactions like skin rash, itching or hives, swelling of the face, lips, or tongue bloody or black, tarry breathing problems changes in vision chest pain chills confusion constipation cough diarrhea dizziness or feeling faint or lightheaded fast or irregular heartbeat fever flushing joint pain low blood counts - this medicine may decrease the number of white blood cells, red blood cells and platelets. You may be at increased risk for infections and bleeding. muscle pain muscle weakness pain, tingling, numbness in the hands or feet persistent headache redness, blistering, peeling or loosening of the skin, including inside the mouth signs and symptoms of high blood sugar such as dizziness; dry mouth; dry skin; fruity breath; nausea; stomach pain; increased hunger or thirst; increased urination signs and symptoms of kidney injury like trouble passing urine or change in the amount of urine signs and symptoms of liver injury like dark urine, light-colored stools, loss of appetite, nausea, right upper belly pain, yellowing of the eyes or skin sweating swollen lymph nodes weight loss Side effects that usually do not require medical attention (report to your doctor or health care professional if they continue or are bothersome): decreased appetite hair loss tiredness This list may not describe all possible side effects. Call your doctor for medical advice about side effects. You may report side effects to FDA at 1-800-FDA-1088. Where should I keep my medication? This drug is given in a hospital or clinic and will not be stored at home. NOTE: This sheet is a summary. It may not cover all possible information. If you have questions about this medicine, talk to your doctor, pharmacist, or health care provider.  2022 Elsevier/Gold Standard (2021-06-01 00:00:00)       To help prevent nausea and vomiting after your  treatment, we encourage you to take your nausea medication as directed.  BELOW ARE SYMPTOMS THAT SHOULD BE REPORTED IMMEDIATELY: *FEVER GREATER THAN 100.4 F (38 C) OR HIGHER *CHILLS OR SWEATING *NAUSEA AND VOMITING THAT IS NOT CONTROLLED WITH YOUR NAUSEA MEDICATION *UNUSUAL SHORTNESS OF BREATH *UNUSUAL BRUISING OR BLEEDING *URINARY PROBLEMS (pain or burning when urinating, or frequent urination) *BOWEL PROBLEMS (unusual diarrhea, constipation, pain near the anus) TENDERNESS IN MOUTH AND THROAT WITH OR WITHOUT PRESENCE OF ULCERS (sore throat, sores in mouth, or a toothache) UNUSUAL RASH, SWELLING OR PAIN  UNUSUAL VAGINAL DISCHARGE OR ITCHING   Items with * indicate a potential emergency and should be followed up as soon as possible or go to the Emergency Department if any problems should occur.  Please show the CHEMOTHERAPY ALERT CARD or IMMUNOTHERAPY ALERT CARD at check-in to the Emergency Department and triage nurse.  Should you have questions after your visit or need to cancel or reschedule your appointment, please contact [facility]  Dept: [phone]  and follow the prompts.  Office hours are 8:00 a.m. to 4:30 p.m. Monday - Friday. Please note that voicemails left after 4:00 p.m. may not be returned until the following business day.  We are closed weekends and major holidays. You have access to a nurse at all times for urgent questions. Please call the main number to the clinic Dept: [phone] and follow the  prompts.   For any non-urgent questions, you may also contact your provider using MyChart. We now offer e-Visits for anyone 6 and older to request care online for non-urgent symptoms. For details visit [email or web address].   Also download the MyChart app! Go to the app store, search "MyChart", open the app, select [facility], and log in with your MyChart username and password.  Due to Covid, a mask is required upon entering the hospital/clinic.  If you do not have a mask, one will be given to you upon arrival. For doctor visits, patients may have 1 support person aged 81 or older with them. For treatment visits, patients cannot have anyone with them due to current Covid guidelines and our immunocompromised population.

## 2021-10-29 NOTE — Telephone Encounter (Signed)
Oral Oncology Patient Advocate Encounter   Was successful in securing patient an $23 grant from Patient [place] [facility]) to provide copayment coverage for Inlyta.  This will keep the out of pocket expense at $0.     I have spoken with the patient.    The billing information is as follows and has been shared with [facility].   Member ID: [id number] Group ID: [id number] RxBin: [id number] Dates of Eligibility: 07/03/21 through 09/30/22  Fund:  [place] Patient [place] Phone [phone] Fax [phone] 10/29/2021 4:15 PM

## 2021-11-01 ENCOUNTER — Telehealth: Payer: Self-pay | Admitting: Pharmacy Technician

## 2021-11-01 NOTE — Telephone Encounter (Signed)
Oral Oncology Patient Advocate Encounter  Was successful in securing patient a $10000 grant from [employer] to provide copayment coverage for Inlyta.  This will keep the out of pocket expense at $0.     [employer] ID: [id number]  I have spoken with the patient.   The billing information is as follows and has been shared with [facility].    RxBin: [id number] PCN: [id number] Member ID: [id number] Group ID: [id number] Dates of Eligibility: 09/11/21 through 09/10/22  Fund:  [place] Patient [facility] Peters Phone [phone] Fax [phone] 11/01/2021 9:36 AM

## 2021-11-02 ENCOUNTER — Other Ambulatory Visit (HOSPITAL_COMMUNITY): Payer: Self-pay

## 2021-11-04 ENCOUNTER — Other Ambulatory Visit (HOSPITAL_COMMUNITY): Payer: Self-pay

## 2021-11-05 ENCOUNTER — Inpatient Hospital Stay: Payer: Medicare Other | Attending: Oncology | Admitting: Nurse Practitioner

## 2021-11-05 ENCOUNTER — Other Ambulatory Visit: Payer: Self-pay

## 2021-11-05 ENCOUNTER — Inpatient Hospital Stay: Payer: Medicare Other

## 2021-11-05 ENCOUNTER — Encounter: Payer: Self-pay | Admitting: Nurse Practitioner

## 2021-11-05 VITALS — BP 164/83 | HR 67 | Temp 98.7°F | Resp 19 | Ht 65.0 in | Wt 142.8 lb

## 2021-11-05 DIAGNOSIS — Z5112 Encounter for antineoplastic immunotherapy: Secondary | ICD-10-CM | POA: Diagnosis not present

## 2021-11-05 DIAGNOSIS — Z79899 Other long term (current) drug therapy: Secondary | ICD-10-CM | POA: Insufficient documentation

## 2021-11-05 DIAGNOSIS — C641 Malignant neoplasm of right kidney, except renal pelvis: Secondary | ICD-10-CM | POA: Diagnosis not present

## 2021-11-05 DIAGNOSIS — E291 Testicular hypofunction: Secondary | ICD-10-CM | POA: Diagnosis not present

## 2021-11-05 DIAGNOSIS — D630 Anemia in neoplastic disease: Secondary | ICD-10-CM | POA: Insufficient documentation

## 2021-11-05 DIAGNOSIS — Q5320 Undescended testicle, unspecified, bilateral: Secondary | ICD-10-CM | POA: Insufficient documentation

## 2021-11-05 DIAGNOSIS — C61 Malignant neoplasm of prostate: Secondary | ICD-10-CM | POA: Diagnosis present

## 2021-11-05 DIAGNOSIS — E119 Type 2 diabetes mellitus without complications: Secondary | ICD-10-CM | POA: Diagnosis not present

## 2021-11-05 DIAGNOSIS — C649 Malignant neoplasm of unspecified kidney, except renal pelvis: Secondary | ICD-10-CM | POA: Diagnosis not present

## 2021-11-05 DIAGNOSIS — C7951 Secondary malignant neoplasm of bone: Secondary | ICD-10-CM | POA: Insufficient documentation

## 2021-11-05 LAB — CBC WITH DIFFERENTIAL (CANCER CENTER ONLY)
Abs Immature Granulocytes: 0.01 10*3/uL (ref 0.00–0.07)
Basophils Absolute: 0 10*3/uL (ref 0.0–0.1)
Basophils Relative: 1 %
Eosinophils Absolute: 0 10*3/uL (ref 0.0–0.5)
Eosinophils Relative: 1 %
HCT: 29.5 % — ABNORMAL LOW (ref 39.0–52.0)
Hemoglobin: 8.8 g/dL — ABNORMAL LOW (ref 13.0–17.0)
Immature Granulocytes: 0 %
Lymphocytes Relative: 12 %
Lymphs Abs: 0.6 10*3/uL — ABNORMAL LOW (ref 0.7–4.0)
MCH: 28.1 pg (ref 26.0–34.0)
MCHC: 29.8 g/dL — ABNORMAL LOW (ref 30.0–36.0)
MCV: 94.2 fL (ref 80.0–100.0)
Monocytes Absolute: 0.3 10*3/uL (ref 0.1–1.0)
Monocytes Relative: 6 %
Neutro Abs: 4 10*3/uL (ref 1.7–7.7)
Neutrophils Relative %: 80 %
Platelet Count: 201 10*3/uL (ref 150–400)
RBC: 3.13 MIL/uL — ABNORMAL LOW (ref 4.22–5.81)
RDW: 14.6 % (ref 11.5–15.5)
WBC Count: 5 10*3/uL (ref 4.0–10.5)
nRBC: 0 % (ref 0.0–0.2)

## 2021-11-05 LAB — CMP (CANCER CENTER ONLY)
ALT: 14 U/L (ref 0–44)
AST: 20 U/L (ref 15–41)
Albumin: 3.9 g/dL (ref 3.5–5.0)
Alkaline Phosphatase: 61 U/L (ref 38–126)
Anion gap: 12 (ref 5–15)
BUN: 34 mg/dL — ABNORMAL HIGH (ref 8–23)
CO2: 19 mmol/L — ABNORMAL LOW (ref 22–32)
Calcium: 9.3 mg/dL (ref 8.9–10.3)
Chloride: 111 mmol/L (ref 98–111)
Creatinine: 1.55 mg/dL — ABNORMAL HIGH (ref 0.61–1.24)
GFR, Estimated: 50 mL/min — ABNORMAL LOW (ref >60)
Glucose, Bld: 145 mg/dL — ABNORMAL HIGH (ref 70–99)
Potassium: 4.6 mmol/L (ref 3.5–5.1)
Sodium: 142 mmol/L (ref 135–145)
Total Bilirubin: 0.5 mg/dL (ref 0.3–1.2)
Total Protein: 6.9 g/dL (ref 6.5–8.1)

## 2021-11-05 LAB — MAGNESIUM: Magnesium: 2.1 mg/dL (ref 1.7–2.4)

## 2021-11-05 NOTE — Progress Notes (Signed)
[facility] OFFICE PROGRESS NOTE   Diagnosis: Prostate cancer, renal cell cancer  INTERVAL HISTORY:   Brandon Peters returns as scheduled.  He completed cycle 1 Pembrolizumab 10/22/2021.  He began axitinib 10/22/2021.  He continues Zytiga and prednisone.  No diarrhea or rash.  No nausea or vomiting.  No mouth sores.  He denies bleeding.  About 5 days ago he developed redness and pain on the soles of his feet.  Symptoms have worsened.  He is having difficulty walking.  He denies any discomfort or skin changes over his hands.  Objective:  Vital signs in last 24 hours:  Blood pressure (!) 164/83, pulse 67, temperature 98.7 F (37.1 C), temperature source Oral, resp. rate 19, height '5\' 5"'  (1.651 m), weight 142 lb 12.8 oz (64.8 kg), SpO2 100 %.    HEENT: No thrush or ulcers. Resp: Lungs clear bilaterally. Cardio: Regular rate and rhythm. GI: Abdomen soft and nontender.  No hepatomegaly. Vascular: Trace edema lower leg bilaterally. Skin: Chronic dryness at the right hand.  Bilateral soles with erythema, peeling skin.   Lab Results:  Lab Results  Component Value Date   WBC 5.0 11/05/2021   HGB 8.8 (L) 11/05/2021   HCT 29.5 (L) 11/05/2021   MCV 94.2 11/05/2021   PLT 201 11/05/2021   NEUTROABS 4.0 11/05/2021    Imaging:  No results found.  Medications: I have reviewed the patient's current medications.  Assessment/Plan: History of Severe anemia-status post a red cell transfusion 03/04/2016 2.. Prostate cancer Extensive sclerotic bone metastases noted on a CT of the abdomen/pelvis 03/04/2016 and MRI abdomen 03/05/2016 Markedly elevated PSA Lupron initiated 03/15/2016; Casodex 14 days beginning 03/15/2016 Prostate biopsy 03/18/2016 05/09/2016 PSA significantly improved at 21 Abiraterone initiated 05/10/2016 PSA less than 0.1 07/10/2018 PSA less than 0.1 08/30/2018 PSA less than 0.1 10/18/2018 PSA less than 0.1 11/29/2018 PSA less than 0.1 on 11/06/2019  PSA less  than 0.1 on 03/05/2020 PSA less than 0.1 on 07/06/2020 PSA less than 0.1 08/17/2021   3.   Symptoms of obstructive uropathy. Improved   4.   Distal duodenal and descending colon masses biopsy of the duodenal mass 03/04/2016 confirmed fragments of small bowel mucosa with focal high-grade dysplasia Biopsy of the descending colon "polyps" revealed fragments of a tubulovillous adenoma CT 08/29/2017-similar soft tissue thickening in the region of the splenic flexure of the colon suboptimally evaluated due to lack of colonic enteric contrast Referred to Dr. Benson Norway Referred to [facility] Resection of descending colon polyps 01/31/2018, tubulovillous adenomas with high-grade dysplasia Attempted endoscopic resection of the fourth segment duodenal "polyp" on 05/09/2018, firm unresectable lesion, biopsy revealed moderately differentiated adenocarcinoma CT abdomen/pelvis 05/25/2018- heterogeneously enhancing right lower pole renal lesion; diffuse sclerotic osseous metastatic lesions compatible with history of prostate cancer CT chest 05/25/2018- no pulmonary metastasis.  Diffuse osseous metastasis involving the axial and appendicular skeleton. 06/19/2018-sleeve resection of third and fourth portions of duodenum with primary end-to-end anastomosis, feeding gastrojejunostomy tube placement, Brandon Peters; poorly differentiated adenocarcinoma duodenum, 3 cm; tumor invades the muscularis propria; lymphovascular invasion present; negative margins; 4 of 11 involved lymph nodes; soft tissue deposits also seen in the mesentery; all mucosal margins and soft tissue margins negative but 2 of the positive nodes are found in the soft tissue margin submitted for frozen section (pT2 pN2); second mucosal lesion 1.2 cm from the proximal margin, tubular adenoma with focal severe dysplasia Intact expression of mismatch repair proteins by IHC; microsatellite stable Cycle 1 adjuvant Xeloda 07/23/2018 Cycle 2 adjuvant  Xeloda 08/13/2018 Cycle 3  adjuvant Xeloda 09/10/2018 (dose reduced to 1000 mg twice daily for 14 days)  Cycle 4 adjuvant Xeloda 10/01/2018 (dose increased to 1500 mg every morning and 1000 mg every afternoon for 14 days) Cycle 5 adjuvant Xeloda 10/22/2018 Cycle 6 adjuvant Xeloda 11/12/2018 Cycle 7 adjuvant Xeloda 12/03/2018 Cycle 8 adjuvant Xeloda 12/24/2018   5.  Anorexia/weight loss- resolved   6.  Undescended testicles   7.  Right renal mass.  CT 08/29/2017-right renal mass involving the lower pole was partially calcified and does demonstrate enhancement following contrast most consistent with renal cell carcinoma.  Lesion not significantly enlarged compared with the prior CT. Followed by urology. 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 to the IVC/RA junction, upper normal size retrocaval node 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  Diabetes   9.  Anemia-stool Hemoccults + March 2021, referred to Dr. Benson Norway for endoscopic evaluation, anemia also secondary to metastatic prostate cancer, hypogonadism Small bowel enteroscopy 02/11/2020-esophagus normal.  Hematin found in the gastric body.  Examined duodenum normal.  No evidence of significant pathology in the entire examined portion of the jejunum.  There was no overt source of bleeding or inflammation.  The bleeding may be  from small AVMs.  Surgical anastomosis was not visible with repeated inspection of the duodenum. Colonoscopy 02/11/2020-multiple sessile and semipedunculated polyps were found in the rectum, descending colon and ascending colon, 3 to 18 mm in size.  Polyps removed (polypectomy descending colon-fragments of tubular adenoma, inflammatory type polyp; polypectomy ascending colon-tubular adenoma, no high-grade dysplasia or malignancy; polypectomy descending colon-fragments of tubulovillous adenoma, no high-grade dysplasia or malignancy; polypectomy colon, rectum-tubular adenoma, no high-grade dysplasia or malignancy). Colonoscopy 02/02/2021-seven 2 to 10 mm polyps in the sigmoid colon (tubular adenoma), descending colon (fragments of tubulovillous adenoma, fragments of tubular adenoma ) and transverse colon (fragments of tubular adenoma). Progressive anemia 07/05/2021; stool cards positive Upper endoscopy/colonoscopy 09/23/2021-multiple polyps removed from the colon-tubular adenomas, diffuse angioectasias in the stomach with bleeding-not amenable to endoscopic treatment    Disposition: Mr. Mercadel began pembrolizumab/axitinib 10/22/2021.  He has developed hand-foot syndrome.  We are placing axitinib on hold.  He will apply lotion liberally to the hands and feet.  CBC and chemistry panel reviewed.  Hemoglobin stable to improved.  Creatinine is better.  He will return for follow-up as scheduled in 1 week.  He will contact the office in the interim with any problems.    Ned Card ANP/GNP-BC   11/05/2021  2:34 PM

## 2021-11-07 ENCOUNTER — Other Ambulatory Visit: Payer: Self-pay | Admitting: Oncology

## 2021-11-08 ENCOUNTER — Other Ambulatory Visit: Payer: Self-pay | Admitting: Nurse Practitioner

## 2021-11-08 DIAGNOSIS — Z85068 Personal history of other malignant neoplasm of small intestine: Secondary | ICD-10-CM

## 2021-11-08 DIAGNOSIS — C179 Malignant neoplasm of small intestine, unspecified: Secondary | ICD-10-CM

## 2021-11-08 DIAGNOSIS — C61 Malignant neoplasm of prostate: Secondary | ICD-10-CM

## 2021-11-09 ENCOUNTER — Other Ambulatory Visit (HOSPITAL_COMMUNITY): Payer: Self-pay

## 2021-11-12 ENCOUNTER — Inpatient Hospital Stay: Payer: Medicare Other | Admitting: Oncology

## 2021-11-12 ENCOUNTER — Inpatient Hospital Stay: Payer: Medicare Other

## 2021-11-12 ENCOUNTER — Other Ambulatory Visit: Payer: Self-pay

## 2021-11-12 ENCOUNTER — Encounter: Payer: Self-pay | Admitting: *Deleted

## 2021-11-12 VITALS — BP 125/73 | HR 66 | Temp 98.7°F | Resp 19 | Ht 65.0 in | Wt 146.0 lb

## 2021-11-12 VITALS — BP 119/67 | HR 61

## 2021-11-12 DIAGNOSIS — C641 Malignant neoplasm of right kidney, except renal pelvis: Secondary | ICD-10-CM

## 2021-11-12 DIAGNOSIS — Z5112 Encounter for antineoplastic immunotherapy: Secondary | ICD-10-CM | POA: Diagnosis not present

## 2021-11-12 DIAGNOSIS — C61 Malignant neoplasm of prostate: Secondary | ICD-10-CM

## 2021-11-12 LAB — CBC WITH DIFFERENTIAL (CANCER CENTER ONLY)
Abs Immature Granulocytes: 0.02 10*3/uL (ref 0.00–0.07)
Basophils Absolute: 0 10*3/uL (ref 0.0–0.1)
Basophils Relative: 0 %
Eosinophils Absolute: 0.1 10*3/uL (ref 0.0–0.5)
Eosinophils Relative: 3 %
HCT: 27.4 % — ABNORMAL LOW (ref 39.0–52.0)
Hemoglobin: 8.3 g/dL — ABNORMAL LOW (ref 13.0–17.0)
Immature Granulocytes: 0 %
Lymphocytes Relative: 28 %
Lymphs Abs: 1.3 10*3/uL (ref 0.7–4.0)
MCH: 29.1 pg (ref 26.0–34.0)
MCHC: 30.3 g/dL (ref 30.0–36.0)
MCV: 96.1 fL (ref 80.0–100.0)
Monocytes Absolute: 0.5 10*3/uL (ref 0.1–1.0)
Monocytes Relative: 12 %
Neutro Abs: 2.6 10*3/uL (ref 1.7–7.7)
Neutrophils Relative %: 57 %
Platelet Count: 176 10*3/uL (ref 150–400)
RBC: 2.85 MIL/uL — ABNORMAL LOW (ref 4.22–5.81)
RDW: 15.3 % (ref 11.5–15.5)
WBC Count: 4.6 10*3/uL (ref 4.0–10.5)
nRBC: 0 % (ref 0.0–0.2)

## 2021-11-12 LAB — CMP (CANCER CENTER ONLY)
ALT: 11 U/L (ref 0–44)
AST: 13 U/L — ABNORMAL LOW (ref 15–41)
Albumin: 3.8 g/dL (ref 3.5–5.0)
Alkaline Phosphatase: 50 U/L (ref 38–126)
Anion gap: 10 (ref 5–15)
BUN: 28 mg/dL — ABNORMAL HIGH (ref 8–23)
CO2: 22 mmol/L (ref 22–32)
Calcium: 9.3 mg/dL (ref 8.9–10.3)
Chloride: 113 mmol/L — ABNORMAL HIGH (ref 98–111)
Creatinine: 1.6 mg/dL — ABNORMAL HIGH (ref 0.61–1.24)
GFR, Estimated: 48 mL/min — ABNORMAL LOW (ref >60)
Glucose, Bld: 124 mg/dL — ABNORMAL HIGH (ref 70–99)
Potassium: 3.7 mmol/L (ref 3.5–5.1)
Sodium: 145 mmol/L (ref 135–145)
Total Bilirubin: 0.6 mg/dL (ref 0.3–1.2)
Total Protein: 6.7 g/dL (ref 6.5–8.1)

## 2021-11-12 LAB — TSH: TSH: 2.984 u[IU]/mL (ref 0.350–4.500)

## 2021-11-12 MED ORDER — SODIUM CHLORIDE 0.9 % IV SOLN
200.0000 mg | Freq: Once | INTRAVENOUS | Status: AC
  Administered 2021-11-12: 200 mg via INTRAVENOUS
  Filled 2021-11-12: qty 8

## 2021-11-12 MED ORDER — SODIUM CHLORIDE 0.9 % IV SOLN: Freq: Once | INTRAVENOUS | Status: AC

## 2021-11-12 NOTE — Progress Notes (Signed)
Patient seen by Dr. Benay Spice today  Vitals are within treatment parameters.  Labs reviewed by Dr. Benay Spice and are not all within treatment parameters. Creatinine 1.6  today(was 2.37 at last tx).  Per physician team, patient is ready for treatment and there are NO modifications to the treatment plan.  Per Dr. Benay Spice: OK to treat w/creatinine 1.6

## 2021-11-12 NOTE — Progress Notes (Signed)
[facility] OFFICE PROGRESS NOTE   Diagnosis: Renal cell carcinoma, prostate cancer  INTERVAL HISTORY:   Brandon Peters returns as scheduled.  Axitinib was placed on hold last week secondary to hand-foot syndrome.  He no longer has foot pain.  He is using a moisturizer.  No diarrhea or bleeding.  He feels well.  Objective:  Vital signs in last 24 hours:  Blood pressure 125/73, pulse 66, temperature 98.7 F (37.1 C), temperature source Oral, resp. rate 19, height _0  (1.651 m), weight 146 lb (66.2 kg), SpO2 100 %.    Resp: Lungs clear bilaterally Cardio: Regular rate and rhythm GI: No hepatosplenomegaly, nontender Vascular: No leg edema  Skin: Dryness at the lower back, dryness of the palms, hyperpigmentation with dryness and superficial desquamation at the soles-no erythema or ulcers  Lab Results:  Lab Results  Component Value Date   WBC 4.6 11/12/2021   HGB 8.3 (L) 11/12/2021   HCT 27.4 (L) 11/12/2021   MCV 96.1 11/12/2021   PLT 176 11/12/2021   NEUTROABS 2.6 11/12/2021    CMP  Lab Results  Component Value Date   NA 145 11/12/2021   K 3.7 11/12/2021   CL 113 (H) 11/12/2021   CO2 22 11/12/2021   GLUCOSE 124 (H) 11/12/2021   BUN 28 (H) 11/12/2021   CREATININE 1.60 (H) 11/12/2021   CALCIUM 9.3 11/12/2021   PROT 6.7 11/12/2021   ALBUMIN 3.8 11/12/2021   AST 13 (L) 11/12/2021   ALT 11 11/12/2021   ALKPHOS 50 11/12/2021   BILITOT 0.6 11/12/2021   GFRNONAA 48 (L) 11/12/2021   GFRAA >60 03/05/2020    Lab Results  Component Value Date   CEA1 3.31 05/03/2019    Medications: I have reviewed the patient's current medications.   Assessment/Plan: History of Severe anemia-status post a red cell transfusion 03/04/2016 2.. Prostate cancer Extensive sclerotic bone metastases noted on a CT of the abdomen/pelvis 03/04/2016 and MRI abdomen 03/05/2016 Markedly elevated PSA Lupron initiated 03/15/2016; Casodex 14 days beginning 03/15/2016 Prostate  biopsy 03/18/2016 05/09/2016 PSA significantly improved at 21 Abiraterone initiated 05/10/2016 PSA less than 0.1 07/10/2018 PSA less than 0.1 08/30/2018 PSA less than 0.1 10/18/2018 PSA less than 0.1 11/29/2018 PSA less than 0.1 on 11/06/2019  PSA less than 0.1 on 03/05/2020 PSA less than 0.1 on 07/06/2020 PSA less than 0.1 08/17/2021   3.   Symptoms of obstructive uropathy. Improved   4.   Distal duodenal and descending colon masses biopsy of the duodenal mass 03/04/2016 confirmed fragments of small bowel mucosa with focal high-grade dysplasia Biopsy of the descending colon "polyps" revealed fragments of a tubulovillous adenoma CT 08/29/2017-similar soft tissue thickening in the region of the splenic flexure of the colon suboptimally evaluated due to lack of colonic enteric contrast Referred to Dr. Benson Norway Referred to [facility] Resection of descending colon polyps 01/31/2018, tubulovillous adenomas with high-grade dysplasia Attempted endoscopic resection of the fourth segment duodenal "polyp" on 05/09/2018, firm unresectable lesion, biopsy revealed moderately differentiated adenocarcinoma CT abdomen/pelvis 05/25/2018- heterogeneously enhancing right lower pole renal lesion; diffuse sclerotic osseous metastatic lesions compatible with history of prostate cancer CT chest 05/25/2018- no pulmonary metastasis.  Diffuse osseous metastasis involving the axial and appendicular skeleton. 06/19/2018-sleeve resection of third and fourth portions of duodenum with primary end-to-end anastomosis, feeding gastrojejunostomy tube placement, Dr. Cloyd Stagers; poorly differentiated adenocarcinoma duodenum, 3 cm; tumor invades the muscularis propria; lymphovascular invasion present; negative margins; 4 of 11 involved lymph nodes; soft tissue deposits also seen in the  mesentery; all mucosal margins and soft tissue margins negative but 2 of the positive nodes are found in the soft tissue margin submitted for frozen section (pT2 pN2); second  mucosal lesion 1.2 cm from the proximal margin, tubular adenoma with focal severe dysplasia Intact expression of mismatch repair proteins by IHC; microsatellite stable Cycle 1 adjuvant Xeloda 07/23/2018 Cycle 2 adjuvant Xeloda 08/13/2018 Cycle 3 adjuvant Xeloda 09/10/2018 (dose reduced to 1000 mg twice daily for 14 days)  Cycle 4 adjuvant Xeloda 10/01/2018 (dose increased to 1500 mg every morning and 1000 mg every afternoon for 14 days) Cycle 5 adjuvant Xeloda 10/22/2018 Cycle 6 adjuvant Xeloda 11/12/2018 Cycle 7 adjuvant Xeloda 12/03/2018 Cycle 8 adjuvant Xeloda 12/24/2018   5.  Anorexia/weight loss- resolved   6.  Undescended testicles   7.  Right renal mass.  CT 08/29/2017-right renal mass involving the lower pole was partially calcified and does demonstrate enhancement following contrast most consistent with renal cell carcinoma.  Lesion not significantly enlarged compared with the prior CT. Followed by urology. 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 to the IVC/RA junction, upper normal size retrocaval node 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  Diabetes   9.  Anemia-stool Hemoccults + March 2021, referred to Dr. Benson Norway for endoscopic evaluation, anemia also  secondary to metastatic prostate cancer, hypogonadism Small bowel enteroscopy 02/11/2020-esophagus normal.  Hematin found in the gastric body.  Examined duodenum normal.  No evidence of significant pathology in the entire examined portion of the jejunum.  There was no overt source of bleeding or inflammation.  The bleeding may be from small AVMs.  Surgical anastomosis was not visible with repeated inspection of the duodenum. Colonoscopy 02/11/2020-multiple sessile and semipedunculated polyps were found in the rectum, descending colon and ascending colon, 3 to 18 mm in size.  Polyps removed (polypectomy descending colon-fragments of tubular adenoma, inflammatory type polyp; polypectomy ascending colon-tubular adenoma, no high-grade dysplasia or malignancy; polypectomy descending colon-fragments of tubulovillous adenoma, no high-grade dysplasia or malignancy; polypectomy colon, rectum-tubular adenoma, no high-grade dysplasia or malignancy). Colonoscopy 02/02/2021-seven 2 to 10 mm polyps in the sigmoid colon (tubular adenoma), descending colon (fragments of tubulovillous adenoma, fragments of tubular adenoma ) and transverse colon (fragments of tubular adenoma). Progressive anemia 07/05/2021; stool cards positive Upper endoscopy/colonoscopy 09/23/2021-multiple polyps removed from the colon-tubular adenomas, diffuse angioectasias in the stomach with bleeding-not amenable to endoscopic treatment     Disposition: Brandon Peters has locally advanced renal cell carcinoma.  He is being treated with axitinib and pembrolizumab.  He appears to be tolerating the pembrolizumab well.  He will complete another cycle today.  Axitinib was placed on hold last week secondary to hand/foot syndrome.  The symptoms have improved.  He will resume axitinib at a reduced dose today.  He will call for recurrent hand/foot symptoms.  He will return for an office visit in approximately 10 days.  He will be scheduled  for an office and lab  visit in 3 weeks.  He does not appear symptomatic from anemia.  We will check a CBC when he returns in 3 weeks. [name], MD  11/12/2021  9:42 AM

## 2021-11-12 NOTE — Progress Notes (Signed)
Patient presents for treatment. RN assessment completed along with the following:  Labs/vitals reviewed - Yes, and ok to treat with Creat of 1.6    Weight within 10% of previous measurement - Yes Informed consent completed and reflects current therapy/intent - Yes, on date 10/22/21             Provider progress note reviewed - Yes, today's provider note was reviewed. Treatment/Antibody/Supportive plan reviewed - Yes, and there are no adjustments needed for today's treatment. S&H and other orders reviewed - Yes, and there are no additional orders identified. Previous treatment date reviewed - Yes, and the appropriate amount of time has elapsed between treatments. Clinic Hand Off Received from - Cristy Friedlander, RN  Patient to proceed with treatment.

## 2021-11-12 NOTE — Patient Instructions (Signed)
[facility]   °Discharge Instructions: °Thank you for choosing [facility] to provide your oncology and hematology care.  ° °If you have a lab appointment with the Cancer Center, please go directly to the Cancer Center and check in at the registration area. °  °Wear comfortable clothing and clothing appropriate for easy access to any Portacath or PICC line.  ° °We strive to give you quality time with your provider. You may need to reschedule your appointment if you arrive late (15 or more minutes).  Arriving late affects you and other patients whose appointments are after yours.  Also, if you miss three or more appointments without notifying the office, you may be dismissed from the clinic at the provider’s discretion.    °  °For prescription refill requests, have your pharmacy contact our office and allow 72 hours for refills to be completed.   ° °Today you received the following chemotherapy and/or immunotherapy agents Keytruda    °  °To help prevent nausea and vomiting after your treatment, we encourage you to take your nausea medication as directed. ° °BELOW ARE SYMPTOMS THAT SHOULD BE REPORTED IMMEDIATELY: °*FEVER GREATER THAN 100.4 F (38 °C) OR HIGHER °*CHILLS OR SWEATING °*NAUSEA AND VOMITING THAT IS NOT CONTROLLED WITH YOUR NAUSEA MEDICATION °*UNUSUAL SHORTNESS OF BREATH °*UNUSUAL BRUISING OR BLEEDING °*URINARY PROBLEMS (pain or burning when urinating, or frequent urination) °*BOWEL PROBLEMS (unusual diarrhea, constipation, pain near the anus) °TENDERNESS IN MOUTH AND THROAT WITH OR WITHOUT PRESENCE OF ULCERS (sore throat, sores in mouth, or a toothache) °UNUSUAL RASH, SWELLING OR PAIN  °UNUSUAL VAGINAL DISCHARGE OR ITCHING  ° °Items with * indicate a potential emergency and should be followed up as soon as possible or go to the Emergency Department if any problems should occur. ° °Please show the CHEMOTHERAPY ALERT CARD or IMMUNOTHERAPY ALERT CARD at check-in to the  Emergency Department and triage nurse. ° °Should you have questions after your visit or need to cancel or reschedule your appointment, please contact [facility]  Dept: [phone]  and follow the prompts.  Office hours are 8:00 a.m. to 4:30 p.m. Monday - Friday. Please note that voicemails left after 4:00 p.m. may not be returned until the following business day.  We are closed weekends and major holidays. You have access to a nurse at all times for urgent questions. Please call the main number to the clinic Dept: [phone] and follow the prompts. ° ° °For any non-urgent questions, you may also contact your provider using MyChart. We now offer e-Visits for anyone 18 and older to request care online for non-urgent symptoms. For details visit [email or web address]. °  °Also download the MyChart app! Go to the app store, search "MyChart", open the app, select [facility], and log in with your MyChart username and password. ° °Due to Covid, a mask is required upon entering the hospital/clinic. If you do not have a mask, one will be given to you upon arrival. For doctor visits, patients may have 1 support person aged 18 or older with them. For treatment visits, patients cannot have anyone with them due to current Covid guidelines and our immunocompromised population.  ° °Pembrolizumab injection °What is this medication? °PEMBROLIZUMAB (pem broe liz ue mab) is a monoclonal antibody. It is used to treat certain types of cancer. °This medicine may be used for other purposes; ask your health care provider or pharmacist if you have questions. °COMMON BRAND NAME(S): Keytruda °  What should I tell my care team before I take this medication? °They need to know if you have any of these conditions: °autoimmune diseases like Crohn's disease, ulcerative colitis, or lupus °have had or planning to have an allogeneic stem cell transplant (uses someone else's stem cells) °history of organ  transplant °history of chest radiation °nervous system problems like myasthenia gravis or Guillain-Barre syndrome °an unusual or allergic reaction to pembrolizumab, other medicines, foods, dyes, or preservatives °pregnant or trying to get pregnant °breast-feeding °How should I use this medication? °This medicine is for infusion into a vein. It is given by a health care professional in a hospital or clinic setting. °A special MedGuide will be given to you before each treatment. Be sure to read this information carefully each time. °Talk to your pediatrician regarding the use of this medicine in children. While this drug may be prescribed for children as young as 6 months for selected conditions, precautions do apply. °Overdosage: If you think you have taken too much of this medicine contact a poison control center or emergency room at once. °NOTE: This medicine is only for you. Do not share this medicine with others. °What if I miss a dose? °It is important not to miss your dose. Call your doctor or health care professional if you are unable to keep an appointment. °What may interact with this medication? °Interactions have not been studied. °This list may not describe all possible interactions. Give your health care provider a list of all the medicines, herbs, non-prescription drugs, or dietary supplements you use. Also tell them if you smoke, drink alcohol, or use illegal drugs. Some items may interact with your medicine. °What should I watch for while using this medication? °Your condition will be monitored carefully while you are receiving this medicine. °You may need blood work done while you are taking this medicine. °Do not become pregnant while taking this medicine or for 4 months after stopping it. Women should inform their doctor if they wish to become pregnant or think they might be pregnant. There is a potential for serious side effects to an unborn child. Talk to your health care professional or  pharmacist for more information. Do not breast-feed an infant while taking this medicine or for 4 months after the last dose. °What side effects may I notice from receiving this medication? °Side effects that you should report to your doctor or health care professional as soon as possible: °allergic reactions like skin rash, itching or hives, swelling of the face, lips, or tongue °bloody or black, tarry °breathing problems °changes in vision °chest pain °chills °confusion °constipation °cough °diarrhea °dizziness or feeling faint or lightheaded °fast or irregular heartbeat °fever °flushing °joint pain °low blood counts - this medicine may decrease the number of white blood cells, red blood cells and platelets. You may be at increased risk for infections and bleeding. °muscle pain °muscle weakness °pain, tingling, numbness in the hands or feet °persistent headache °redness, blistering, peeling or loosening of the skin, including inside the mouth °signs and symptoms of high blood sugar such as dizziness; dry mouth; dry skin; fruity breath; nausea; stomach pain; increased hunger or thirst; increased urination °signs and symptoms of kidney injury like trouble passing urine or change in the amount of urine °signs and symptoms of liver injury like dark urine, light-colored stools, loss of appetite, nausea, right upper belly pain, yellowing of the eyes or skin °sweating °swollen lymph nodes °weight loss °Side effects that usually do not require   medical attention (report to your doctor or health care professional if they continue or are bothersome): °decreased appetite °hair loss °tiredness °This list may not describe all possible side effects. Call your doctor for medical advice about side effects. You may report side effects to FDA at 1-800-FDA-1088. °Where should I keep my medication? °This drug is given in a hospital or clinic and will not be stored at home. °NOTE: This sheet is a summary. It may not cover all possible  information. If you have questions about this medicine, talk to your doctor, pharmacist, or health care provider. °© 2022 [employer]vier/Gold Standard (2021-06-01 00:00:00) ° °

## 2021-11-13 LAB — PROSTATE-SPECIFIC AG, SERUM (LABCORP): Prostate Specific Ag, Serum: 0.1 ng/mL (ref 0.0–4.0)

## 2021-11-17 ENCOUNTER — Other Ambulatory Visit (HOSPITAL_COMMUNITY): Payer: Self-pay

## 2021-11-18 ENCOUNTER — Telehealth: Payer: Self-pay | Admitting: Pharmacy Technician

## 2021-11-18 ENCOUNTER — Other Ambulatory Visit: Payer: Self-pay | Admitting: *Deleted

## 2021-11-18 ENCOUNTER — Other Ambulatory Visit (HOSPITAL_COMMUNITY): Payer: Self-pay

## 2021-11-18 ENCOUNTER — Encounter: Payer: Self-pay | Admitting: Oncology

## 2021-11-18 ENCOUNTER — Encounter: Payer: Self-pay | Admitting: Nurse Practitioner

## 2021-11-18 DIAGNOSIS — C61 Malignant neoplasm of prostate: Secondary | ICD-10-CM

## 2021-11-18 MED ORDER — ABIRATERONE ACETATE 250 MG PO TABS
ORAL_TABLET | ORAL | 5 refills | Status: DC
  Filled 2021-11-18: qty 120, 30d supply, fill #0
  Filled 2021-11-18: qty 120, fill #0
  Filled 2021-12-15: qty 120, 30d supply, fill #1
  Filled 2022-01-24: qty 120, 30d supply, fill #2
  Filled 2022-02-16: qty 120, 30d supply, fill #3
  Filled 2022-03-18: qty 120, 30d supply, fill #4
  Filled 2022-04-15: qty 120, 30d supply, fill #5

## 2021-11-18 NOTE — Telephone Encounter (Signed)
Refills for abiraterone sent to [facility] [place] [facility] at request of oral oncology pharmacy program direction. OK per Dr. Benay Spice.

## 2021-11-18 NOTE — Telephone Encounter (Signed)
Oral Oncology Patient Advocate Encounter   Received notification from [employer] that prior authorization for Zytiga is required.   PA submitted on CoverMyMeds Key [id number]  Status is pending   Oral Oncology Clinic will continue to follow.  [place] Patient [place] Phone [phone] Fax [phone] 11/18/2021 3:59 PM

## 2021-11-18 NOTE — Telephone Encounter (Signed)
Oral Oncology Patient Advocate Encounter  Prior Authorization for Fabio Asa has been approved.    PA# [id number] Effective dates: 11/18/21 through 09/25/22  Patients co-pay is $26.73  Oral Oncology Clinic will continue to follow.   [place] Patient [place] Phone [phone] Fax [phone] 11/18/2021 4:02 PM

## 2021-11-19 ENCOUNTER — Other Ambulatory Visit (HOSPITAL_COMMUNITY): Payer: Self-pay

## 2021-11-22 ENCOUNTER — Other Ambulatory Visit (HOSPITAL_COMMUNITY): Payer: Self-pay

## 2021-11-23 ENCOUNTER — Inpatient Hospital Stay: Payer: Medicare Other | Admitting: Nurse Practitioner

## 2021-11-23 ENCOUNTER — Encounter: Payer: Self-pay | Admitting: Nurse Practitioner

## 2021-11-23 ENCOUNTER — Other Ambulatory Visit: Payer: Self-pay

## 2021-11-23 ENCOUNTER — Other Ambulatory Visit: Payer: Self-pay | Admitting: Oncology

## 2021-11-23 VITALS — BP 108/67 | HR 77 | Temp 97.9°F | Resp 20 | Ht 65.0 in | Wt 140.6 lb

## 2021-11-23 DIAGNOSIS — C641 Malignant neoplasm of right kidney, except renal pelvis: Secondary | ICD-10-CM

## 2021-11-23 DIAGNOSIS — Z5112 Encounter for antineoplastic immunotherapy: Secondary | ICD-10-CM | POA: Diagnosis not present

## 2021-11-23 NOTE — Progress Notes (Signed)
[facility] OFFICE PROGRESS NOTE   Diagnosis: Renal cell carcinoma, prostate cancer  INTERVAL HISTORY:   Brandon Peters returns as scheduled.  He resumed axitinib at a reduced dose 11/12/2021.  He denies nausea/vomiting.  No mouth sores.  No diarrhea.  No rash.  No hand or foot pain.  He continues to note dryness over the soles of the feet.  He denies bleeding.  Objective:  Vital signs in last 24 hours:  Blood pressure 108/67, pulse 77, temperature 97.9 F (36.6 C), temperature source Oral, resp. rate 20, height _0  (1.651 m), weight 140 lb 9.6 oz (63.8 kg), SpO2 100 %.    HEENT: No thrush or ulcers. Resp: Lungs clear bilaterally. Cardio: Regular rate and rhythm. GI: Abdomen soft and nontender.  No hepatomegaly. Vascular: No leg edema. Skin: Mild dryness and hyperpigmentation on the palms.  Mild superficial desquamation at the soles.  No erythema or ulcers.   Lab Results:  Lab Results  Component Value Date   WBC 4.6 11/12/2021   HGB 8.3 (L) 11/12/2021   HCT 27.4 (L) 11/12/2021   MCV 96.1 11/12/2021   PLT 176 11/12/2021   NEUTROABS 2.6 11/12/2021    Imaging:  No results found.  Medications: I have reviewed the patient's current medications.  Assessment/Plan: History of Severe anemia-status post a red cell transfusion 03/04/2016 2.. Prostate cancer Extensive sclerotic bone metastases noted on a CT of the abdomen/pelvis 03/04/2016 and MRI abdomen 03/05/2016 Markedly elevated PSA Lupron initiated 03/15/2016; Casodex 14 days beginning 03/15/2016 Prostate biopsy 03/18/2016 05/09/2016 PSA significantly improved at 21 Abiraterone initiated 05/10/2016 PSA less than 0.1 07/10/2018 PSA less than 0.1 08/30/2018 PSA less than 0.1 10/18/2018 PSA less than 0.1 11/29/2018 PSA less than 0.1 on 11/06/2019  PSA less than 0.1 on 03/05/2020 PSA less than 0.1 on 07/06/2020 PSA less than 0.1 08/17/2021   3.   Symptoms of obstructive uropathy. Improved   4.   Distal  duodenal and descending colon masses biopsy of the duodenal mass 03/04/2016 confirmed fragments of small bowel mucosa with focal high-grade dysplasia Biopsy of the descending colon "polyps" revealed fragments of a tubulovillous adenoma CT 08/29/2017-similar soft tissue thickening in the region of the splenic flexure of the colon suboptimally evaluated due to lack of colonic enteric contrast Referred to Dr. Benson Norway Referred to [facility] Resection of descending colon polyps 01/31/2018, tubulovillous adenomas with high-grade dysplasia Attempted endoscopic resection of the fourth segment duodenal "polyp" on 05/09/2018, firm unresectable lesion, biopsy revealed moderately differentiated adenocarcinoma CT abdomen/pelvis 05/25/2018- heterogeneously enhancing right lower pole renal lesion; diffuse sclerotic osseous metastatic lesions compatible with history of prostate cancer CT chest 05/25/2018- no pulmonary metastasis.  Diffuse osseous metastasis involving the axial and appendicular skeleton. 06/19/2018-sleeve resection of third and fourth portions of duodenum with primary end-to-end anastomosis, feeding gastrojejunostomy tube placement, Dr. Cloyd Stagers; poorly differentiated adenocarcinoma duodenum, 3 cm; tumor invades the muscularis propria; lymphovascular invasion present; negative margins; 4 of 11 involved lymph nodes; soft tissue deposits also seen in the mesentery; all mucosal margins and soft tissue margins negative but 2 of the positive nodes are found in the soft tissue margin submitted for frozen section (pT2 pN2); second mucosal lesion 1.2 cm from the proximal margin, tubular adenoma with focal severe dysplasia Intact expression of mismatch repair proteins by IHC; microsatellite stable Cycle 1 adjuvant Xeloda 07/23/2018 Cycle 2 adjuvant Xeloda 08/13/2018 Cycle 3 adjuvant Xeloda 09/10/2018 (dose reduced to 1000 mg twice daily for 14 days)  Cycle 4 adjuvant Xeloda 10/01/2018 (dose increased  to 1500 mg every morning and  1000 mg every afternoon for 14 days) Cycle 5 adjuvant Xeloda 10/22/2018 Cycle 6 adjuvant Xeloda 11/12/2018 Cycle 7 adjuvant Xeloda 12/03/2018 Cycle 8 adjuvant Xeloda 12/24/2018   5.  Anorexia/weight loss- resolved   6.  Undescended testicles   7.  Right renal mass.  CT 08/29/2017-right renal mass involving the lower pole was partially calcified and does demonstrate enhancement following contrast most consistent with renal cell carcinoma.  Lesion not significantly enlarged compared with the prior CT. Followed by urology. 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 to the IVC/RA junction, upper normal size retrocaval node 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  Diabetes   9.  Anemia-stool Hemoccults + March 2021, referred to Dr. Benson Norway for endoscopic evaluation, anemia also secondary to metastatic prostate cancer, hypogonadism Small bowel enteroscopy 02/11/2020-esophagus normal.  Hematin found in the gastric body.  Examined duodenum normal.  No evidence of significant pathology in the entire examined portion of the jejunum.  There was no overt source of bleeding or inflammation.  The bleeding may be from small AVMs.  Surgical anastomosis was not visible with repeated inspection of  the duodenum. Colonoscopy 02/11/2020-multiple sessile and semipedunculated polyps were found in the rectum, descending colon and ascending colon, 3 to 18 mm in size.  Polyps removed (polypectomy descending colon-fragments of tubular adenoma, inflammatory type polyp; polypectomy ascending colon-tubular adenoma, no high-grade dysplasia or malignancy; polypectomy descending colon-fragments of tubulovillous adenoma, no high-grade dysplasia or malignancy; polypectomy colon, rectum-tubular adenoma, no high-grade dysplasia or malignancy). Colonoscopy 02/02/2021-seven 2 to 10 mm polyps in the sigmoid colon (tubular adenoma), descending colon (fragments of tubulovillous adenoma, fragments of tubular adenoma ) and transverse colon (fragments of tubular adenoma). Progressive anemia 07/05/2021; stool cards positive Upper endoscopy/colonoscopy 09/23/2021-multiple polyps removed from the colon-tubular adenomas, diffuse angioectasias in the stomach with bleeding-not amenable to endoscopic treatment      Disposition: Brandon Peters appears stable.  He completed a cycle of Pembrolizumab 11/12/2021.  He resumed axitinib at a reduced dose of 5 mg daily 11/12/2021.  He seems to be tolerating the reduced dose well.  He understands to contact the office with recurrent hand/foot symptoms.  He will return for follow-up in Pembrolizumab as scheduled on 12/03/2021.  We are available to see him sooner if needed.    Ned Card ANP/GNP-BC   11/23/2021  10:52 AM

## 2021-11-23 NOTE — Telephone Encounter (Signed)
Checking to see w/Lisa if OK to refill

## 2021-11-24 ENCOUNTER — Encounter: Payer: Self-pay | Admitting: Oncology

## 2021-11-27 ENCOUNTER — Other Ambulatory Visit: Payer: Self-pay | Admitting: Oncology

## 2021-12-03 ENCOUNTER — Other Ambulatory Visit: Payer: Self-pay

## 2021-12-03 ENCOUNTER — Other Ambulatory Visit (HOSPITAL_COMMUNITY): Payer: Self-pay

## 2021-12-03 ENCOUNTER — Inpatient Hospital Stay: Payer: Medicare Other | Admitting: Oncology

## 2021-12-03 ENCOUNTER — Inpatient Hospital Stay: Payer: Medicare Other | Attending: Oncology

## 2021-12-03 ENCOUNTER — Encounter: Payer: Self-pay | Admitting: *Deleted

## 2021-12-03 ENCOUNTER — Inpatient Hospital Stay: Payer: Medicare Other

## 2021-12-03 DIAGNOSIS — Z5112 Encounter for antineoplastic immunotherapy: Secondary | ICD-10-CM | POA: Diagnosis present

## 2021-12-03 DIAGNOSIS — C7951 Secondary malignant neoplasm of bone: Secondary | ICD-10-CM | POA: Diagnosis present

## 2021-12-03 DIAGNOSIS — C179 Malignant neoplasm of small intestine, unspecified: Secondary | ICD-10-CM

## 2021-12-03 DIAGNOSIS — L271 Localized skin eruption due to drugs and medicaments taken internally: Secondary | ICD-10-CM | POA: Diagnosis not present

## 2021-12-03 DIAGNOSIS — E291 Testicular hypofunction: Secondary | ICD-10-CM | POA: Insufficient documentation

## 2021-12-03 DIAGNOSIS — C61 Malignant neoplasm of prostate: Secondary | ICD-10-CM

## 2021-12-03 DIAGNOSIS — D649 Anemia, unspecified: Secondary | ICD-10-CM | POA: Insufficient documentation

## 2021-12-03 DIAGNOSIS — E119 Type 2 diabetes mellitus without complications: Secondary | ICD-10-CM | POA: Diagnosis not present

## 2021-12-03 DIAGNOSIS — Z79899 Other long term (current) drug therapy: Secondary | ICD-10-CM | POA: Insufficient documentation

## 2021-12-03 DIAGNOSIS — C641 Malignant neoplasm of right kidney, except renal pelvis: Secondary | ICD-10-CM

## 2021-12-03 DIAGNOSIS — M79673 Pain in unspecified foot: Secondary | ICD-10-CM | POA: Diagnosis not present

## 2021-12-03 DIAGNOSIS — Z86010 Personal history of colonic polyps: Secondary | ICD-10-CM | POA: Diagnosis not present

## 2021-12-03 DIAGNOSIS — Z85068 Personal history of other malignant neoplasm of small intestine: Secondary | ICD-10-CM

## 2021-12-03 DIAGNOSIS — Q5320 Undescended testicle, unspecified, bilateral: Secondary | ICD-10-CM | POA: Insufficient documentation

## 2021-12-03 DIAGNOSIS — C649 Malignant neoplasm of unspecified kidney, except renal pelvis: Secondary | ICD-10-CM | POA: Diagnosis not present

## 2021-12-03 LAB — CBC WITH DIFFERENTIAL (CANCER CENTER ONLY)
Abs Immature Granulocytes: 0.02 10*3/uL (ref 0.00–0.07)
Basophils Absolute: 0 10*3/uL (ref 0.0–0.1)
Basophils Relative: 0 %
Eosinophils Absolute: 0.1 10*3/uL (ref 0.0–0.5)
Eosinophils Relative: 2 %
HCT: 29.9 % — ABNORMAL LOW (ref 39.0–52.0)
Hemoglobin: 9.3 g/dL — ABNORMAL LOW (ref 13.0–17.0)
Immature Granulocytes: 0 %
Lymphocytes Relative: 26 %
Lymphs Abs: 1.5 10*3/uL (ref 0.7–4.0)
MCH: 29 pg (ref 26.0–34.0)
MCHC: 31.1 g/dL (ref 30.0–36.0)
MCV: 93.1 fL (ref 80.0–100.0)
Monocytes Absolute: 0.6 10*3/uL (ref 0.1–1.0)
Monocytes Relative: 10 %
Neutro Abs: 3.7 10*3/uL (ref 1.7–7.7)
Neutrophils Relative %: 62 %
Platelet Count: 147 10*3/uL — ABNORMAL LOW (ref 150–400)
RBC: 3.21 MIL/uL — ABNORMAL LOW (ref 4.22–5.81)
RDW: 14.9 % (ref 11.5–15.5)
WBC Count: 5.9 10*3/uL (ref 4.0–10.5)
nRBC: 0 % (ref 0.0–0.2)

## 2021-12-03 LAB — CMP (CANCER CENTER ONLY)
ALT: 20 U/L (ref 0–44)
AST: 17 U/L (ref 15–41)
Albumin: 4 g/dL (ref 3.5–5.0)
Alkaline Phosphatase: 53 U/L (ref 38–126)
Anion gap: 9 (ref 5–15)
BUN: 28 mg/dL — ABNORMAL HIGH (ref 8–23)
CO2: 23 mmol/L (ref 22–32)
Calcium: 9.1 mg/dL (ref 8.9–10.3)
Chloride: 108 mmol/L (ref 98–111)
Creatinine: 1.57 mg/dL — ABNORMAL HIGH (ref 0.61–1.24)
GFR, Estimated: 49 mL/min — ABNORMAL LOW (ref >60)
Glucose, Bld: 127 mg/dL — ABNORMAL HIGH (ref 70–99)
Potassium: 4 mmol/L (ref 3.5–5.1)
Sodium: 140 mmol/L (ref 135–145)
Total Bilirubin: 0.5 mg/dL (ref 0.3–1.2)
Total Protein: 6.9 g/dL (ref 6.5–8.1)

## 2021-12-03 LAB — SAMPLE TO BLOOD BANK

## 2021-12-03 MED ORDER — SODIUM CHLORIDE 0.9 % IV SOLN: Freq: Once | INTRAVENOUS | Status: AC

## 2021-12-03 MED ORDER — POTASSIUM CHLORIDE ER 20 MEQ PO TBCR: 1.0000 | EXTENDED_RELEASE_TABLET | Freq: Two times a day (BID) | ORAL | 1 refills | Status: DC

## 2021-12-03 MED ORDER — SODIUM CHLORIDE 0.9 % IV SOLN
200.0000 mg | Freq: Once | INTRAVENOUS | Status: AC
  Administered 2021-12-03: 200 mg via INTRAVENOUS
  Filled 2021-12-03: qty 8

## 2021-12-03 NOTE — Progress Notes (Signed)
?[place] ?OFFICE PROGRESS NOTE ? ? ?Diagnosis: Renal cell carcinoma, prostate cancer ? ?INTERVAL HISTORY:  ? ?Brandon Peters returns as scheduled.  He continues axitinib.  He has developed recurrent foot pain.  No rash other than on the hands and feet.  He reports a few episodes of diarrhea.  He otherwise feels well. ? ?Objective: ? ?Vital signs in last 24 hours: ? ?Blood pressure (!) 143/71, pulse 84, temperature 97.8 ?F (36.6 ?C), temperature source Oral, resp. rate 18, height '5\' 5"'  (1.651 m), weight 142 lb 6.4 oz (64.6 kg), SpO2 98 %. ?  ? ?HEENT: No thrush or ulcers ?Resp: Lungs clear bilaterally ?Cardio: Regular rate and rhythm ?GI: No hepatosplenomegaly ?Vascular: No leg edema, the left lower leg is larger than the right side  ?Skin: Dryness, superficial desquamation of the palms, dryness, superficial desquamation, erythema at the soles ? ?Portacath/PICC-without erythema ? ?Lab Results: ? ?Lab Results  ?Component Value Date  ? WBC 5.9 12/03/2021  ? HGB 9.3 (L) 12/03/2021  ? HCT 29.9 (L) 12/03/2021  ? MCV 93.1 12/03/2021  ? PLT 147 (L) 12/03/2021  ? NEUTROABS 3.7 12/03/2021  ? ? ?CMP  ?Lab Results  ?Component Value Date  ? NA 140 12/03/2021  ? K 4.0 12/03/2021  ? CL 108 12/03/2021  ? CO2 23 12/03/2021  ? GLUCOSE 127 (H) 12/03/2021  ? BUN 28 (H) 12/03/2021  ? CREATININE 1.57 (H) 12/03/2021  ? CALCIUM 9.1 12/03/2021  ? PROT 6.9 12/03/2021  ? ALBUMIN 4.0 12/03/2021  ? AST 17 12/03/2021  ? ALT 20 12/03/2021  ? ALKPHOS 53 12/03/2021  ? BILITOT 0.5 12/03/2021  ? GFRNONAA 49 (L) 12/03/2021  ? GFRAA >60 03/05/2020  ? ? ?Lab Results  ?Component Value Date  ? CEA1 3.31 05/03/2019  ? ? ?Lab Results  ?Component Value Date  ? INR 1.0 10/13/2021  ? LABPROT 12.9 10/13/2021  ? ? ?Imaging: ? ?No results found. ? ?Medications: I have reviewed the patient's current medications. ? ? ?Assessment/Plan: ?History of Severe anemia-status post a red cell transfusion 03/04/2016 ?2.. Prostate cancer ?Extensive sclerotic  bone metastases noted on a CT of the abdomen/pelvis 03/04/2016 and MRI abdomen 03/05/2016 ?Markedly elevated PSA ?Lupron initiated 03/15/2016; Casodex ?14 days beginning 03/15/2016 ?Prostate biopsy 03/18/2016 ?05/09/2016 PSA significantly improved at 21 ?Abiraterone initiated 05/10/2016 ?PSA less than 0.1 07/10/2018 ?PSA less than 0.1 08/30/2018 ?PSA less than 0.1 10/18/2018 ?PSA less than 0.1 11/29/2018 ?PSA less than 0.1 on 11/06/2019  ?PSA less than 0.1 on 03/05/2020 ?PSA less than 0.1 on 07/06/2020 ?PSA less than 0.1 08/17/2021 ?  ?3.   Symptoms of obstructive uropathy. Improved ?  ?4.   Distal duodenal and descending colon masses ?biopsy of the duodenal mass 03/04/2016 confirmed fragments of small bowel mucosa with focal high-grade dysplasia ?Biopsy of the descending colon "polyps" revealed fragments of a tubulovillous adenoma ?CT 08/29/2017-similar soft tissue thickening in the region of the splenic flexure of the colon suboptimally evaluated due to lack of colonic enteric contrast ?Referred to Dr. Benson Norway ?Referred to [facility] ?Resection of descending colon polyps 01/31/2018, tubulovillous adenomas with high-grade dysplasia ?Attempted endoscopic resection of the fourth segment duodenal "polyp" on 05/09/2018, firm unresectable lesion, biopsy revealed moderately differentiated adenocarcinoma ?CT abdomen/pelvis 05/25/2018- heterogeneously enhancing right lower pole renal lesion; diffuse sclerotic osseous metastatic lesions compatible with history of prostate cancer ?CT chest 05/25/2018- no pulmonary metastasis.  Diffuse osseous metastasis involving the axial and appendicular skeleton. ?06/19/2018-sleeve resection of third and fourth portions of duodenum with primary end-to-end  anastomosis, feeding gastrojejunostomy tube placement, Dr. Cloyd Stagers; poorly differentiated adenocarcinoma duodenum, 3 cm; tumor invades the muscularis propria; lymphovascular invasion present; negative margins; 4 of 11 involved lymph nodes; soft tissue deposits  also seen in the mesentery; all mucosal margins and soft tissue margins negative but 2 of the positive nodes are found in the soft tissue margin submitted for frozen section (pT2 pN2); second mucosal lesion 1.2 cm from the proximal margin, tubular adenoma with focal severe dysplasia ?Intact expression of mismatch repair proteins by IHC; microsatellite stable ?Cycle 1 adjuvant Xeloda 07/23/2018 ?Cycle 2 adjuvant Xeloda 08/13/2018 ?Cycle 3 adjuvant Xeloda 09/10/2018 (dose reduced to 1000 mg twice daily for 14 days)  ?Cycle 4 adjuvant Xeloda 10/01/2018 (dose increased to 1500 mg every morning and 1000 mg every afternoon for 14 days) ?Cycle 5 adjuvant Xeloda 10/22/2018 ?Cycle 6 adjuvant Xeloda 11/12/2018 ?Cycle 7 adjuvant Xeloda 12/03/2018 ?Cycle 8 adjuvant Xeloda 12/24/2018 ?  ?5.  Anorexia/weight loss- resolved ?  ?6.  Undescended testicles ?  ?7.  Right renal mass.  CT 08/29/2017-right renal mass involving the lower pole was partially calcified and does demonstrate enhancement following contrast most consistent with renal cell carcinoma.  Lesion not significantly enlarged compared with the prior CT. Followed by urology. ?Renal ultrasound 07/06/2021-enlargement of right renal mass ?CT abdomen 09/28/2020-large minute heterogenously enhancing mass in the inferior pole the right kidney with new fullness of the inferior branch of the right renal vein, no enlarged abdominal lymph nodes, unchanged sclerotic osseous metastatic disease ?MRI abdomen 08/12/2021-heterogenously enhancing mass in the lower pole right kidney extending into the central sinus fat and posteriorly has mass-effect on the right psoas muscle without definite invasion, tumor thrombus in the right renal vein extending to the IVC/RA junction, upper normal size retrocaval node ?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  ?8.  Diabetes ?  ?9.  Anemia-stool Hemoccults + March 2021, referred to Dr. Benson Norway for endoscopic evaluation, anemia also secondary to metastatic prostate cancer, hypogonadism ?Small bowel enteroscopy 02/11/2020-esophagus normal.  Hematin found in the gastric body.  Examined duodenum normal.  No evidence of significant pathology in the entire examined portion of the jejunum.  There was no overt source of bleeding or inflammation.  The bleeding may be from small AVMs.  Surgical anastomosis was not visible with repeated inspection of the duodenum. ?Colonoscopy 02/11/2020-multiple sessile and semipedunculated polyps were found in the rectum, descending colon and ascending colon, 3 to 18 mm in size.  Polyps removed (polypectomy descending colon-fragments of tubular adenoma, inflammatory type polyp; polypectomy ascending colon-tubular adenoma, no high-grade dysplasia or malignancy; polypectomy descending colon-fragments of tubulovillous adenoma, no high-grade dysplasia or malignancy; polypectomy colon, rectum-tubular adenoma, no high-grade dysplasia or malignancy). ?Colonoscopy 02/02/2021-seven 2 to 10 mm polyps in the sigmoid colon (tubular adenoma), descending colon (fragments of tubulovillous adenoma, fragments of tubular adenoma ) and transverse colon (fragments of tubular adenoma). ?Progressive anemia 07/05/2021; stool cards positive ?Upper endoscopy/colonoscopy 09/23/2021-multiple polyps removed from the colon-tubular adenomas, diffuse angioectasias in the stomach with bleeding-not amenable to endoscopic treatment ?  ?  ? ? ? ?Disposition: ?Brandon Peters has locally advanced renal cell carcinoma, currently being treated with axitinib and pembrolizumab.  He has tolerated the pembrolizumab well, but he has developed painful skin changes at the feet.  We  will  place axitinib on hold again.  He will return for follow-up in approximately 10 days.  We will consider resuming axitinib with further dose reduction. ? ? ? ?[name], MD ? ?12/03/2021  ?4:14 PM ? ? ?

## 2021-12-03 NOTE — Progress Notes (Signed)
Patient seen by Dr. Benay Spice today ? ?Vitals are within treatment parameters. ? ?Labs reviewed by Dr. Benay Spice and are not all within treatment parameters. OK to treat w/creatinine 1.56 ? ?Per physician team, patient is ready for treatment and there are NO modifications to the treatment plan.  ?

## 2021-12-03 NOTE — Progress Notes (Signed)
Patient presents for treatment. RN assessment completed along with the following: ? ?Labs/vitals reviewed - Yes, and within treatment parameters. OK to treat with creat 1.56  ?Weight within 10% of previous measurement - Yes ?Informed consent completed and reflects current therapy/intent - Yes, on date 10/22/21             ?Provider progress note reviewed - Today's provider note is not yet available. I reviewed the most recent oncology provider progress note in chart dated 11/23/21. ?Treatment/Antibody/Supportive plan reviewed - Yes, and there are no adjustments needed for today's treatment. ?S&H and other orders reviewed - Yes, and there are no additional orders identified. ?Previous treatment date reviewed - Yes, and the appropriate amount of time has elapsed between treatments. ?Clinic Hand Off Received from - Cristy Friedlander, RN ? ?Patient to proceed with treatment.  ? ?

## 2021-12-03 NOTE — Patient Instructions (Signed)
[facility]   °Discharge Instructions: °Thank you for choosing [facility] to provide your oncology and hematology care.  ° °If you have a lab appointment with the Cancer Center, please go directly to the Cancer Center and check in at the registration area. °  °Wear comfortable clothing and clothing appropriate for easy access to any Portacath or PICC line.  ° °We strive to give you quality time with your provider. You may need to reschedule your appointment if you arrive late (15 or more minutes).  Arriving late affects you and other patients whose appointments are after yours.  Also, if you miss three or more appointments without notifying the office, you may be dismissed from the clinic at the provider’s discretion.    °  °For prescription refill requests, have your pharmacy contact our office and allow 72 hours for refills to be completed.   ° °Today you received the following chemotherapy and/or immunotherapy agents Keytruda    °  °To help prevent nausea and vomiting after your treatment, we encourage you to take your nausea medication as directed. ° °BELOW ARE SYMPTOMS THAT SHOULD BE REPORTED IMMEDIATELY: °*FEVER GREATER THAN 100.4 F (38 °C) OR HIGHER °*CHILLS OR SWEATING °*NAUSEA AND VOMITING THAT IS NOT CONTROLLED WITH YOUR NAUSEA MEDICATION °*UNUSUAL SHORTNESS OF BREATH °*UNUSUAL BRUISING OR BLEEDING °*URINARY PROBLEMS (pain or burning when urinating, or frequent urination) °*BOWEL PROBLEMS (unusual diarrhea, constipation, pain near the anus) °TENDERNESS IN MOUTH AND THROAT WITH OR WITHOUT PRESENCE OF ULCERS (sore throat, sores in mouth, or a toothache) °UNUSUAL RASH, SWELLING OR PAIN  °UNUSUAL VAGINAL DISCHARGE OR ITCHING  ° °Items with * indicate a potential emergency and should be followed up as soon as possible or go to the Emergency Department if any problems should occur. ° °Please show the CHEMOTHERAPY ALERT CARD or IMMUNOTHERAPY ALERT CARD at check-in to the  Emergency Department and triage nurse. ° °Should you have questions after your visit or need to cancel or reschedule your appointment, please contact [facility]  Dept: [phone]  and follow the prompts.  Office hours are 8:00 a.m. to 4:30 p.m. Monday - Friday. Please note that voicemails left after 4:00 p.m. may not be returned until the following business day.  We are closed weekends and major holidays. You have access to a nurse at all times for urgent questions. Please call the main number to the clinic Dept: [phone] and follow the prompts. ° ° °For any non-urgent questions, you may also contact your provider using MyChart. We now offer e-Visits for anyone 18 and older to request care online for non-urgent symptoms. For details visit [email or web address]. °  °Also download the MyChart app! Go to the app store, search "MyChart", open the app, select [facility], and log in with your MyChart username and password. ° °Due to Covid, a mask is required upon entering the hospital/clinic. If you do not have a mask, one will be given to you upon arrival. For doctor visits, patients may have 1 support person aged 18 or older with them. For treatment visits, patients cannot have anyone with them due to current Covid guidelines and our immunocompromised population.  ° °Pembrolizumab injection °What is this medication? °PEMBROLIZUMAB (pem broe liz ue mab) is a monoclonal antibody. It is used to treat certain types of cancer. °This medicine may be used for other purposes; ask your health care provider or pharmacist if you have questions. °COMMON BRAND NAME(S): Keytruda °  What should I tell my care team before I take this medication? °They need to know if you have any of these conditions: °autoimmune diseases like Crohn's disease, ulcerative colitis, or lupus °have had or planning to have an allogeneic stem cell transplant (uses someone else's stem cells) °history of organ  transplant °history of chest radiation °nervous system problems like myasthenia gravis or Guillain-Barre syndrome °an unusual or allergic reaction to pembrolizumab, other medicines, foods, dyes, or preservatives °pregnant or trying to get pregnant °breast-feeding °How should I use this medication? °This medicine is for infusion into a vein. It is given by a health care professional in a hospital or clinic setting. °A special MedGuide will be given to you before each treatment. Be sure to read this information carefully each time. °Talk to your pediatrician regarding the use of this medicine in children. While this drug may be prescribed for children as young as 6 months for selected conditions, precautions do apply. °Overdosage: If you think you have taken too much of this medicine contact a poison control center or emergency room at once. °NOTE: This medicine is only for you. Do not share this medicine with others. °What if I miss a dose? °It is important not to miss your dose. Call your doctor or health care professional if you are unable to keep an appointment. °What may interact with this medication? °Interactions have not been studied. °This list may not describe all possible interactions. Give your health care provider a list of all the medicines, herbs, non-prescription drugs, or dietary supplements you use. Also tell them if you smoke, drink alcohol, or use illegal drugs. Some items may interact with your medicine. °What should I watch for while using this medication? °Your condition will be monitored carefully while you are receiving this medicine. °You may need blood work done while you are taking this medicine. °Do not become pregnant while taking this medicine or for 4 months after stopping it. Women should inform their doctor if they wish to become pregnant or think they might be pregnant. There is a potential for serious side effects to an unborn child. Talk to your health care professional or  pharmacist for more information. Do not breast-feed an infant while taking this medicine or for 4 months after the last dose. °What side effects may I notice from receiving this medication? °Side effects that you should report to your doctor or health care professional as soon as possible: °allergic reactions like skin rash, itching or hives, swelling of the face, lips, or tongue °bloody or black, tarry °breathing problems °changes in vision °chest pain °chills °confusion °constipation °cough °diarrhea °dizziness or feeling faint or lightheaded °fast or irregular heartbeat °fever °flushing °joint pain °low blood counts - this medicine may decrease the number of white blood cells, red blood cells and platelets. You may be at increased risk for infections and bleeding. °muscle pain °muscle weakness °pain, tingling, numbness in the hands or feet °persistent headache °redness, blistering, peeling or loosening of the skin, including inside the mouth °signs and symptoms of high blood sugar such as dizziness; dry mouth; dry skin; fruity breath; nausea; stomach pain; increased hunger or thirst; increased urination °signs and symptoms of kidney injury like trouble passing urine or change in the amount of urine °signs and symptoms of liver injury like dark urine, light-colored stools, loss of appetite, nausea, right upper belly pain, yellowing of the eyes or skin °sweating °swollen lymph nodes °weight loss °Side effects that usually do not require   medical attention (report to your doctor or health care professional if they continue or are bothersome): °decreased appetite °hair loss °tiredness °This list may not describe all possible side effects. Call your doctor for medical advice about side effects. You may report side effects to FDA at 1-800-FDA-1088. °Where should I keep my medication? °This drug is given in a hospital or clinic and will not be stored at home. °NOTE: This sheet is a summary. It may not cover all possible  information. If you have questions about this medicine, talk to your doctor, pharmacist, or health care provider. °© 2022 [employer] (2021-06-01 00:00:00) ° °

## 2021-12-09 ENCOUNTER — Other Ambulatory Visit (HOSPITAL_COMMUNITY): Payer: Self-pay

## 2021-12-13 ENCOUNTER — Other Ambulatory Visit (HOSPITAL_COMMUNITY): Payer: Self-pay

## 2021-12-14 ENCOUNTER — Other Ambulatory Visit: Payer: Self-pay

## 2021-12-14 ENCOUNTER — Inpatient Hospital Stay: Payer: Medicare Other | Admitting: Nurse Practitioner

## 2021-12-14 ENCOUNTER — Encounter: Payer: Self-pay | Admitting: Nurse Practitioner

## 2021-12-14 VITALS — BP 143/78 | HR 97 | Temp 97.8°F | Resp 20 | Ht 65.0 in | Wt 145.5 lb

## 2021-12-14 DIAGNOSIS — C61 Malignant neoplasm of prostate: Secondary | ICD-10-CM | POA: Diagnosis not present

## 2021-12-14 DIAGNOSIS — C641 Malignant neoplasm of right kidney, except renal pelvis: Secondary | ICD-10-CM

## 2021-12-14 DIAGNOSIS — Z5112 Encounter for antineoplastic immunotherapy: Secondary | ICD-10-CM | POA: Diagnosis not present

## 2021-12-14 NOTE — Progress Notes (Addendum)
?Normal ?OFFICE PROGRESS NOTE ? ? ?Diagnosis: Renal cell carcinoma, prostate cancer ? ?INTERVAL HISTORY:  ? ?[name] returns as scheduled.  Axitinib placed on hold 12/03/2021 due to progressive hand-foot syndrome.  He completed a cycle of Pembrolizumab 12/03/2021.  Feet are better.  He is no longer having pain.  He notes dryness on the right hand.  No nausea or vomiting.  No rash.  No significant diarrhea, maybe 1 loose stool every other day. ? ?Objective: ? ?Vital signs in last 24 hours: ? ?Blood pressure (!) 143/78, pulse 97, temperature 97.8 ?F (36.6 ?C), temperature source Oral, resp. rate 20, height '5\' 5"'  (1.651 m), weight 145 lb 8 oz (66 kg), SpO2 100 %. ?  ? ?HEENT: No thrush or ulcers. ?Resp: Lungs clear bilaterally. ?Cardio: Regular rate and rhythm. ?GI: No hepatosplenomegaly. ?Vascular: No leg edema.  Left lower leg is slightly larger than the right lower leg. ?Skin: Right hand has a dry appearance.  Soles without erythema.  No skin breakdown.  Dry appearance in general. ? ? ?Lab Results: ? ?Lab Results  ?Component Value Date  ? WBC 5.9 12/03/2021  ? HGB 9.3 (L) 12/03/2021  ? HCT 29.9 (L) 12/03/2021  ? MCV 93.1 12/03/2021  ? PLT 147 (L) 12/03/2021  ? NEUTROABS 3.7 12/03/2021  ? ? ?Imaging: ? ?No results found. ? ?Medications: I have reviewed the patient's current medications. ? ?Assessment/Plan: ?History of Severe anemia-status post a red cell transfusion 03/04/2016 ?2.. Prostate cancer ?Extensive sclerotic bone metastases noted on a CT of the abdomen/pelvis 03/04/2016 and MRI abdomen 03/05/2016 ?Markedly elevated PSA ?Lupron initiated 03/15/2016; Casodex ?14 days beginning 03/15/2016 ?Prostate biopsy 03/18/2016 ?05/09/2016 PSA significantly improved at 21 ?Abiraterone initiated 05/10/2016 ?PSA less than 0.1 07/10/2018 ?PSA less than 0.1 08/30/2018 ?PSA less than 0.1 10/18/2018 ?PSA less than 0.1 11/29/2018 ?PSA less than 0.1 on 11/06/2019  ?PSA less than 0.1 on 03/05/2020 ?PSA less than 0.1  on 07/06/2020 ?PSA less than 0.1 08/17/2021 ?  ?3.   Symptoms of obstructive uropathy. Improved ?  ?4.   Distal duodenal and descending colon masses ?biopsy of the duodenal mass 03/04/2016 confirmed fragments of small bowel mucosa with focal high-grade dysplasia ?Biopsy of the descending colon "polyps" revealed fragments of a tubulovillous adenoma ?CT 08/29/2017-similar soft tissue thickening in the region of the splenic flexure of the colon suboptimally evaluated due to lack of colonic enteric contrast ?Referred to Dr. Benson Norway ?Referred to [facility] ?Resection of descending colon polyps 01/31/2018, tubulovillous adenomas with high-grade dysplasia ?Attempted endoscopic resection of the fourth segment duodenal "polyp" on 05/09/2018, firm unresectable lesion, biopsy revealed moderately differentiated adenocarcinoma ?CT abdomen/pelvis 05/25/2018- heterogeneously enhancing right lower pole renal lesion; diffuse sclerotic osseous metastatic lesions compatible with history of prostate cancer ?CT chest 05/25/2018- no pulmonary metastasis.  Diffuse osseous metastasis involving the axial and appendicular skeleton. ?06/19/2018-sleeve resection of third and fourth portions of duodenum with primary end-to-end anastomosis, feeding gastrojejunostomy tube placement, Dr. Cloyd Stagers; poorly differentiated adenocarcinoma duodenum, 3 cm; tumor invades the muscularis propria; lymphovascular invasion present; negative margins; 4 of 11 involved lymph nodes; soft tissue deposits also seen in the mesentery; all mucosal margins and soft tissue margins negative but 2 of the positive nodes are found in the soft tissue margin submitted for frozen section (pT2 pN2); second mucosal lesion 1.2 cm from the proximal margin, tubular adenoma with focal severe dysplasia ?Intact expression of mismatch repair proteins by IHC; microsatellite stable ?Cycle 1 adjuvant Xeloda 07/23/2018 ?Cycle 2 adjuvant Xeloda 08/13/2018 ?Cycle 3  adjuvant Xeloda 09/10/2018 (dose reduced to  1000 mg twice daily for 14 days)  ?Cycle 4 adjuvant Xeloda 10/01/2018 (dose increased to 1500 mg every morning and 1000 mg every afternoon for 14 days) ?Cycle 5 adjuvant Xeloda 10/22/2018 ?Cycle 6 adjuvant Xeloda 11/12/2018 ?Cycle 7 adjuvant Xeloda 12/03/2018 ?Cycle 8 adjuvant Xeloda 12/24/2018 ?  ?5.  Anorexia/weight loss- resolved ?  ?6.  Undescended testicles ?  ?7.  Right renal mass.  CT 08/29/2017-right renal mass involving the lower pole was partially calcified and does demonstrate enhancement following contrast most consistent with renal cell carcinoma.  Lesion not significantly enlarged compared with the prior CT. Followed by urology. ?Renal ultrasound 07/06/2021-enlargement of right renal mass ?CT abdomen 09/28/2020-large minute heterogenously enhancing mass in the inferior pole the right kidney with new fullness of the inferior branch of the right renal vein, no enlarged abdominal lymph nodes, unchanged sclerotic osseous metastatic disease ?MRI abdomen 08/12/2021-heterogenously enhancing mass in the lower pole right kidney extending into the central sinus fat and posteriorly has mass-effect on the right psoas muscle without definite invasion, tumor thrombus in the right renal vein extending to the IVC/RA junction, upper normal size retrocaval node ?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  ?8.  Diabetes ?  ?9.  Anemia-stool Hemoccults + March 2021, referred to Dr. Benson Norway for endoscopic evaluation, anemia also secondary to metastatic prostate cancer, hypogonadism ?Small bowel enteroscopy 02/11/2020-esophagus normal.  Hematin found in the gastric body.  Examined duodenum normal.  No evidence of significant  pathology in the entire examined portion of the jejunum.  There was no overt source of bleeding or inflammation.  The bleeding may be from small AVMs.  Surgical anastomosis was not visible with repeated inspection of the duodenum. ?Colonoscopy 02/11/2020-multiple sessile and semipedunculated polyps were found in the rectum, descending colon and ascending colon, 3 to 18 mm in size.  Polyps removed (polypectomy descending colon-fragments of tubular adenoma, inflammatory type polyp; polypectomy ascending colon-tubular adenoma, no high-grade dysplasia or malignancy; polypectomy descending colon-fragments of tubulovillous adenoma, no high-grade dysplasia or malignancy; polypectomy colon, rectum-tubular adenoma, no high-grade dysplasia or malignancy). ?Colonoscopy 02/02/2021-seven 2 to 10 mm polyps in the sigmoid colon (tubular adenoma), descending colon (fragments of tubulovillous adenoma, fragments of tubular adenoma ) and transverse colon (fragments of tubular adenoma). ?Progressive anemia 07/05/2021; stool cards positive ?Upper endoscopy/colonoscopy 09/23/2021-multiple polyps removed from the colon-tubular adenomas, diffuse angioectasias in the stomach with bleeding-not amenable to endoscopic treatment ?  ?  ?  ? ?Disposition: Mr. Joung appears stable.  Skin changes at the feet have improved since axitinib was placed on hold.  He will resume axitinib at a dose of 2 mg twice daily.  He will return as scheduled on 12/24/2021. ? ?He plans to contact [facility] today to schedule a follow-up appointment. ? ? ? ?Ned Card ANP/GNP-BC  ? ?12/14/2021  ?11:19 AM ? ? ? ? ? ? ? ?

## 2021-12-15 ENCOUNTER — Other Ambulatory Visit (HOSPITAL_COMMUNITY): Payer: Self-pay

## 2021-12-15 ENCOUNTER — Other Ambulatory Visit: Payer: Self-pay | Admitting: Oncology

## 2021-12-15 ENCOUNTER — Telehealth: Payer: Self-pay

## 2021-12-15 ENCOUNTER — Other Ambulatory Visit: Payer: Self-pay | Admitting: Nurse Practitioner

## 2021-12-15 MED ORDER — AXITINIB 1 MG PO TABS
2.0000 mg | ORAL_TABLET | Freq: Two times a day (BID) | ORAL | 0 refills | Status: DC
  Filled 2021-12-15 (×2): qty 120, 30d supply, fill #0

## 2021-12-15 NOTE — Telephone Encounter (Signed)
-----   Message from Owens Shark, NP sent at 12/15/2021  7:45 AM EDT ----- ?Please let him know pharmacist recommends axitinib 2 mg twice daily. We are sending a new prescription to the [facility].  ? ?

## 2021-12-15 NOTE — Telephone Encounter (Signed)
Patient gave verbal understanding and denied further questions ?

## 2021-12-16 ENCOUNTER — Other Ambulatory Visit (HOSPITAL_COMMUNITY): Payer: Self-pay

## 2021-12-16 ENCOUNTER — Encounter: Payer: Self-pay | Admitting: Nurse Practitioner

## 2021-12-19 ENCOUNTER — Other Ambulatory Visit: Payer: Self-pay | Admitting: Oncology

## 2021-12-24 ENCOUNTER — Inpatient Hospital Stay: Payer: Medicare Other | Admitting: Nurse Practitioner

## 2021-12-24 ENCOUNTER — Inpatient Hospital Stay: Payer: Medicare Other

## 2021-12-24 ENCOUNTER — Encounter: Payer: Self-pay | Admitting: *Deleted

## 2021-12-24 ENCOUNTER — Encounter: Payer: Self-pay | Admitting: Nurse Practitioner

## 2021-12-24 VITALS — BP 148/88 | HR 89 | Temp 97.8°F | Resp 18 | Ht 65.0 in | Wt 141.6 lb

## 2021-12-24 DIAGNOSIS — C641 Malignant neoplasm of right kidney, except renal pelvis: Secondary | ICD-10-CM

## 2021-12-24 DIAGNOSIS — Z5112 Encounter for antineoplastic immunotherapy: Secondary | ICD-10-CM | POA: Diagnosis not present

## 2021-12-24 LAB — CBC WITH DIFFERENTIAL (CANCER CENTER ONLY)
Abs Immature Granulocytes: 0.06 10*3/uL (ref 0.00–0.07)
Basophils Absolute: 0 10*3/uL (ref 0.0–0.1)
Basophils Relative: 0 %
Eosinophils Absolute: 0.1 10*3/uL (ref 0.0–0.5)
Eosinophils Relative: 1 %
HCT: 28.7 % — ABNORMAL LOW (ref 39.0–52.0)
Hemoglobin: 9 g/dL — ABNORMAL LOW (ref 13.0–17.0)
Immature Granulocytes: 1 %
Lymphocytes Relative: 16 %
Lymphs Abs: 1.1 10*3/uL (ref 0.7–4.0)
MCH: 29.1 pg (ref 26.0–34.0)
MCHC: 31.4 g/dL (ref 30.0–36.0)
MCV: 92.9 fL (ref 80.0–100.0)
Monocytes Absolute: 0.7 10*3/uL (ref 0.1–1.0)
Monocytes Relative: 10 %
Neutro Abs: 5.3 10*3/uL (ref 1.7–7.7)
Neutrophils Relative %: 72 %
Platelet Count: 285 10*3/uL (ref 150–400)
RBC: 3.09 MIL/uL — ABNORMAL LOW (ref 4.22–5.81)
RDW: 13.9 % (ref 11.5–15.5)
WBC Count: 7.2 10*3/uL (ref 4.0–10.5)
nRBC: 0 % (ref 0.0–0.2)

## 2021-12-24 LAB — CMP (CANCER CENTER ONLY)
ALT: 14 U/L (ref 0–44)
AST: 16 U/L (ref 15–41)
Albumin: 4.2 g/dL (ref 3.5–5.0)
Alkaline Phosphatase: 61 U/L (ref 38–126)
Anion gap: 12 (ref 5–15)
BUN: 28 mg/dL — ABNORMAL HIGH (ref 8–23)
CO2: 22 mmol/L (ref 22–32)
Calcium: 9.5 mg/dL (ref 8.9–10.3)
Chloride: 108 mmol/L (ref 98–111)
Creatinine: 1.72 mg/dL — ABNORMAL HIGH (ref 0.61–1.24)
GFR, Estimated: 44 mL/min — ABNORMAL LOW (ref >60)
Glucose, Bld: 119 mg/dL — ABNORMAL HIGH (ref 70–99)
Potassium: 3.6 mmol/L (ref 3.5–5.1)
Sodium: 142 mmol/L (ref 135–145)
Total Bilirubin: 0.5 mg/dL (ref 0.3–1.2)
Total Protein: 7.6 g/dL (ref 6.5–8.1)

## 2021-12-24 LAB — TOTAL PROTEIN, URINE DIPSTICK: Protein, ur: 30 mg/dL — AB

## 2021-12-24 LAB — TSH: TSH: 5.18 u[IU]/mL — ABNORMAL HIGH (ref 0.350–4.500)

## 2021-12-24 MED ORDER — SODIUM CHLORIDE 0.9 % IV SOLN: Freq: Once | INTRAVENOUS | Status: AC

## 2021-12-24 MED ORDER — SODIUM CHLORIDE 0.9 % IV SOLN
200.0000 mg | Freq: Once | INTRAVENOUS | Status: AC
  Administered 2021-12-24: 200 mg via INTRAVENOUS
  Filled 2021-12-24: qty 8

## 2021-12-24 NOTE — Progress Notes (Signed)
?[place] ?OFFICE PROGRESS NOTE ? ? ?Diagnosis: Renal cell carcinoma, prostate cancer ? ?INTERVAL HISTORY:  ? ?Brandon Peters returns as scheduled.  He resumed axitinib at a dose of 2 mg twice daily following office visit 12/14/2021.  He denies any skin changes over the feet, no pain.  He notes a rash dorsum of the right hand thumb side.  No other rash.  He estimates 2 loose stools a day.  No nausea or vomiting.  No bleeding. ? ?Objective: ? ?Vital signs in last 24 hours: ? ?Blood pressure (!) 148/88, pulse 89, temperature 97.8 ?F (36.6 ?C), temperature source Oral, resp. rate 18, height _0  (1.651 m), weight 141 lb 9.6 oz (64.2 kg), SpO2 100 %. ?  ? ?HEENT: No thrush or ulcers. ?Resp: Lungs clear bilaterally. ?Cardio: Regular rate and rhythm. ?GI: Abdomen soft and nontender.  No hepatosplenomegaly. ?Vascular: No leg edema. ?Skin: Soles without erythema, no skin breakdown.  Right hand in general has a dry appearance.  Dry hyperpigmented rash dorsal aspect right hand near the base of the thumb and extending laterally.  No rash noted elsewhere on the body. ? ? ?Lab Results: ? ?Lab Results  ?Component Value Date  ? WBC 7.2 12/24/2021  ? HGB 9.0 (L) 12/24/2021  ? HCT 28.7 (L) 12/24/2021  ? MCV 92.9 12/24/2021  ? PLT 285 12/24/2021  ? NEUTROABS 5.3 12/24/2021  ? ? ?Imaging: ? ?No results found. ? ?Medications: I have reviewed the patient's current medications. ? ?Assessment/Plan: ?History of Severe anemia-status post a red cell transfusion 03/04/2016 ?2.. Prostate cancer ?Extensive sclerotic bone metastases noted on a CT of the abdomen/pelvis 03/04/2016 and MRI abdomen 03/05/2016 ?Markedly elevated PSA ?Lupron initiated 03/15/2016; Casodex ?14 days beginning 03/15/2016 ?Prostate biopsy 03/18/2016 ?05/09/2016 PSA significantly improved at 21 ?Abiraterone initiated 05/10/2016 ?PSA less than 0.1 07/10/2018 ?PSA less than 0.1 08/30/2018 ?PSA less than 0.1 10/18/2018 ?PSA less than 0.1 11/29/2018 ?PSA less than 0.1  on 11/06/2019  ?PSA less than 0.1 on 03/05/2020 ?PSA less than 0.1 on 07/06/2020 ?PSA less than 0.1 08/17/2021 ?  ?3.   Symptoms of obstructive uropathy. Improved ?  ?4.   Distal duodenal and descending colon masses ?biopsy of the duodenal mass 03/04/2016 confirmed fragments of small bowel mucosa with focal high-grade dysplasia ?Biopsy of the descending colon "polyps" revealed fragments of a tubulovillous adenoma ?CT 08/29/2017-similar soft tissue thickening in the region of the splenic flexure of the colon suboptimally evaluated due to lack of colonic enteric contrast ?Referred to Dr. Benson Norway ?Referred to [facility] ?Resection of descending colon polyps 01/31/2018, tubulovillous adenomas with high-grade dysplasia ?Attempted endoscopic resection of the fourth segment duodenal "polyp" on 05/09/2018, firm unresectable lesion, biopsy revealed moderately differentiated adenocarcinoma ?CT abdomen/pelvis 05/25/2018- heterogeneously enhancing right lower pole renal lesion; diffuse sclerotic osseous metastatic lesions compatible with history of prostate cancer ?CT chest 05/25/2018- no pulmonary metastasis.  Diffuse osseous metastasis involving the axial and appendicular skeleton. ?06/19/2018-sleeve resection of third and fourth portions of duodenum with primary end-to-end anastomosis, feeding gastrojejunostomy tube placement, Dr. Cloyd Stagers; poorly differentiated adenocarcinoma duodenum, 3 cm; tumor invades the muscularis propria; lymphovascular invasion present; negative margins; 4 of 11 involved lymph nodes; soft tissue deposits also seen in the mesentery; all mucosal margins and soft tissue margins negative but 2 of the positive nodes are found in the soft tissue margin submitted for frozen section (pT2 pN2); second mucosal lesion 1.2 cm from the proximal margin, tubular adenoma with focal severe dysplasia ?Intact expression of mismatch repair proteins by  IHC; microsatellite stable ?Cycle 1 adjuvant Xeloda 07/23/2018 ?Cycle 2 adjuvant Xeloda  08/13/2018 ?Cycle 3 adjuvant Xeloda 09/10/2018 (dose reduced to 1000 mg twice daily for 14 days)  ?Cycle 4 adjuvant Xeloda 10/01/2018 (dose increased to 1500 mg every morning and 1000 mg every afternoon for 14 days) ?Cycle 5 adjuvant Xeloda 10/22/2018 ?Cycle 6 adjuvant Xeloda 11/12/2018 ?Cycle 7 adjuvant Xeloda 12/03/2018 ?Cycle 8 adjuvant Xeloda 12/24/2018 ?  ?5.  Anorexia/weight loss- resolved ?  ?6.  Undescended testicles ?  ?7.  Right renal mass.  CT 08/29/2017-right renal mass involving the lower pole was partially calcified and does demonstrate enhancement following contrast most consistent with renal cell carcinoma.  Lesion not significantly enlarged compared with the prior CT. Followed by urology. ?Renal ultrasound 07/06/2021-enlargement of right renal mass ?CT abdomen 09/28/2020-large minute heterogenously enhancing mass in the inferior pole the right kidney with new fullness of the inferior branch of the right renal vein, no enlarged abdominal lymph nodes, unchanged sclerotic osseous metastatic disease ?MRI abdomen 08/12/2021-heterogenously enhancing mass in the lower pole right kidney extending into the central sinus fat and posteriorly has mass-effect on the right psoas muscle without definite invasion, tumor thrombus in the right renal vein extending to the IVC/RA junction, upper normal size retrocaval node ?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  ?8.  Diabetes ?  ?9.  Anemia-stool Hemoccults + March 2021, referred to Dr. Benson Norway for endoscopic evaluation, anemia also secondary to metastatic  prostate cancer, hypogonadism ?Small bowel enteroscopy 02/11/2020-esophagus normal.  Hematin found in the gastric body.  Examined duodenum normal.  No evidence of significant pathology in the entire examined portion of the jejunum.  There was no overt source of bleeding or inflammation.  The bleeding may be from small AVMs.  Surgical anastomosis was not visible with repeated inspection of the duodenum. ?Colonoscopy 02/11/2020-multiple sessile and semipedunculated polyps were found in the rectum, descending colon and ascending colon, 3 to 18 mm in size.  Polyps removed (polypectomy descending colon-fragments of tubular adenoma, inflammatory type polyp; polypectomy ascending colon-tubular adenoma, no high-grade dysplasia or malignancy; polypectomy descending colon-fragments of tubulovillous adenoma, no high-grade dysplasia or malignancy; polypectomy colon, rectum-tubular adenoma, no high-grade dysplasia or malignancy). ?Colonoscopy 02/02/2021-seven 2 to 10 mm polyps in the sigmoid colon (tubular adenoma), descending colon (fragments of tubulovillous adenoma, fragments of tubular adenoma ) and transverse colon (fragments of tubular adenoma). ?Progressive anemia 07/05/2021; stool cards positive ?Upper endoscopy/colonoscopy 09/23/2021-multiple polyps removed from the colon-tubular adenomas, diffuse angioectasias in the stomach with bleeding-not amenable to endoscopic treatment ?  ? ?Disposition: Brandon Peters appears stable.  He has resumed axitinib and overall seems to be tolerating well.  He understands to contact the office with recurrent/progressive changes of hand-foot syndrome.  He has completed 3 cycles of Pembrolizumab.  Plan to proceed with cycle 4 today as scheduled. ? ?He estimates having 2 loose stools a day.  He will contact the office if this increases.  He will try Imodium if needed. ? ?He has a small area of skin rash dorsal aspect of the right hand.  He will contact the office if this progresses. ? ?CBC and  chemistry panel reviewed.  Labs adequate to proceed with  treatment.  Creatinine is elevated, within baseline range. ? ?He will return for lab, follow-up, Pembrolizumab in 3 weeks.  He will contact the [facility]

## 2021-12-24 NOTE — Progress Notes (Signed)
Patient seen by Ned Card NP today ? ?Vitals are within treatment parameters. ? ?Labs reviewed by Ned Card NP and are not all within treatment parameters. OK to treat w/creatinine 1.72 ? ?Per physician team, patient is ready for treatment and there are NO modifications to the treatment plan.  ?

## 2021-12-24 NOTE — Progress Notes (Signed)
Patient presents for treatment. RN assessment completed along with the following: ? ?Labs/vitals reviewed - Yes, and ok to treat with creat 1.7    ?Weight within 10% of previous measurement - Yes ?Informed consent completed and reflects current therapy/intent - Yes, on date 10/22/21             ?Provider progress note reviewed - Yes, today's provider note was reviewed. ?Treatment/Antibody/Supportive plan reviewed - Yes, and there are no adjustments needed for today's treatment. ?S&H and other orders reviewed - Yes, and there are no additional orders identified. ?Previous treatment date reviewed - Yes, and the appropriate amount of time has elapsed between treatments. ?Clinic Hand Off Received from - Cristy Friedlander, RN ? ?Patient to proceed with treatment.  ? ?

## 2021-12-24 NOTE — Patient Instructions (Signed)
[facility]   °Discharge Instructions: °Thank you for choosing [facility] to provide your oncology and hematology care.  ° °If you have a lab appointment with the Cancer Center, please go directly to the Cancer Center and check in at the registration area. °  °Wear comfortable clothing and clothing appropriate for easy access to any Portacath or PICC line.  ° °We strive to give you quality time with your provider. You may need to reschedule your appointment if you arrive late (15 or more minutes).  Arriving late affects you and other patients whose appointments are after yours.  Also, if you miss three or more appointments without notifying the office, you may be dismissed from the clinic at the provider’s discretion.    °  °For prescription refill requests, have your pharmacy contact our office and allow 72 hours for refills to be completed.   ° °Today you received the following chemotherapy and/or immunotherapy agents Keytruda    °  °To help prevent nausea and vomiting after your treatment, we encourage you to take your nausea medication as directed. ° °BELOW ARE SYMPTOMS THAT SHOULD BE REPORTED IMMEDIATELY: °*FEVER GREATER THAN 100.4 F (38 °C) OR HIGHER °*CHILLS OR SWEATING °*NAUSEA AND VOMITING THAT IS NOT CONTROLLED WITH YOUR NAUSEA MEDICATION °*UNUSUAL SHORTNESS OF BREATH °*UNUSUAL BRUISING OR BLEEDING °*URINARY PROBLEMS (pain or burning when urinating, or frequent urination) °*BOWEL PROBLEMS (unusual diarrhea, constipation, pain near the anus) °TENDERNESS IN MOUTH AND THROAT WITH OR WITHOUT PRESENCE OF ULCERS (sore throat, sores in mouth, or a toothache) °UNUSUAL RASH, SWELLING OR PAIN  °UNUSUAL VAGINAL DISCHARGE OR ITCHING  ° °Items with * indicate a potential emergency and should be followed up as soon as possible or go to the Emergency Department if any problems should occur. ° °Please show the CHEMOTHERAPY ALERT CARD or IMMUNOTHERAPY ALERT CARD at check-in to the  Emergency Department and triage nurse. ° °Should you have questions after your visit or need to cancel or reschedule your appointment, please contact [facility]  Dept: [phone]  and follow the prompts.  Office hours are 8:00 a.m. to 4:30 p.m. Monday - Friday. Please note that voicemails left after 4:00 p.m. may not be returned until the following business day.  We are closed weekends and major holidays. You have access to a nurse at all times for urgent questions. Please call the main number to the clinic Dept: [phone] and follow the prompts. ° ° °For any non-urgent questions, you may also contact your provider using MyChart. We now offer e-Visits for anyone 18 and older to request care online for non-urgent symptoms. For details visit [email or web address]. °  °Also download the MyChart app! Go to the app store, search "MyChart", open the app, select [facility], and log in with your MyChart username and password. ° °Due to Covid, a mask is required upon entering the hospital/clinic. If you do not have a mask, one will be given to you upon arrival. For doctor visits, patients may have 1 support person aged 18 or older with them. For treatment visits, patients cannot have anyone with them due to current Covid guidelines and our immunocompromised population.  ° °Pembrolizumab injection °What is this medication? °PEMBROLIZUMAB (pem broe liz ue mab) is a monoclonal antibody. It is used to treat certain types of cancer. °This medicine may be used for other purposes; ask your health care provider or pharmacist if you have questions. °COMMON BRAND NAME(S): Keytruda °  What should I tell my care team before I take this medication? °They need to know if you have any of these conditions: °autoimmune diseases like Crohn's disease, ulcerative colitis, or lupus °have had or planning to have an allogeneic stem cell transplant (uses someone else's stem cells) °history of organ  transplant °history of chest radiation °nervous system problems like myasthenia gravis or Guillain-Barre syndrome °an unusual or allergic reaction to pembrolizumab, other medicines, foods, dyes, or preservatives °pregnant or trying to get pregnant °breast-feeding °How should I use this medication? °This medicine is for infusion into a vein. It is given by a health care professional in a hospital or clinic setting. °A special MedGuide will be given to you before each treatment. Be sure to read this information carefully each time. °Talk to your pediatrician regarding the use of this medicine in children. While this drug may be prescribed for children as young as 6 months for selected conditions, precautions do apply. °Overdosage: If you think you have taken too much of this medicine contact a poison control center or emergency room at once. °NOTE: This medicine is only for you. Do not share this medicine with others. °What if I miss a dose? °It is important not to miss your dose. Call your doctor or health care professional if you are unable to keep an appointment. °What may interact with this medication? °Interactions have not been studied. °This list may not describe all possible interactions. Give your health care provider a list of all the medicines, herbs, non-prescription drugs, or dietary supplements you use. Also tell them if you smoke, drink alcohol, or use illegal drugs. Some items may interact with your medicine. °What should I watch for while using this medication? °Your condition will be monitored carefully while you are receiving this medicine. °You may need blood work done while you are taking this medicine. °Do not become pregnant while taking this medicine or for 4 months after stopping it. Women should inform their doctor if they wish to become pregnant or think they might be pregnant. There is a potential for serious side effects to an unborn child. Talk to your health care professional or  pharmacist for more information. Do not breast-feed an infant while taking this medicine or for 4 months after the last dose. °What side effects may I notice from receiving this medication? °Side effects that you should report to your doctor or health care professional as soon as possible: °allergic reactions like skin rash, itching or hives, swelling of the face, lips, or tongue °bloody or black, tarry °breathing problems °changes in vision °chest pain °chills °confusion °constipation °cough °diarrhea °dizziness or feeling faint or lightheaded °fast or irregular heartbeat °fever °flushing °joint pain °low blood counts - this medicine may decrease the number of white blood cells, red blood cells and platelets. You may be at increased risk for infections and bleeding. °muscle pain °muscle weakness °pain, tingling, numbness in the hands or feet °persistent headache °redness, blistering, peeling or loosening of the skin, including inside the mouth °signs and symptoms of high blood sugar such as dizziness; dry mouth; dry skin; fruity breath; nausea; stomach pain; increased hunger or thirst; increased urination °signs and symptoms of kidney injury like trouble passing urine or change in the amount of urine °signs and symptoms of liver injury like dark urine, light-colored stools, loss of appetite, nausea, right upper belly pain, yellowing of the eyes or skin °sweating °swollen lymph nodes °weight loss °Side effects that usually do not require   medical attention (report to your doctor or health care professional if they continue or are bothersome): °decreased appetite °hair loss °tiredness °This list may not describe all possible side effects. Call your doctor for medical advice about side effects. You may report side effects to FDA at 1-800-FDA-1088. °Where should I keep my medication? °This drug is given in a hospital or clinic and will not be stored at home. °NOTE: This sheet is a summary. It may not cover all possible  information. If you have questions about this medicine, talk to your doctor, pharmacist, or health care provider. °© 2022 [employer] (2021-06-01 00:00:00) ° °

## 2021-12-29 ENCOUNTER — Other Ambulatory Visit: Payer: Self-pay | Admitting: Nurse Practitioner

## 2021-12-29 DIAGNOSIS — C641 Malignant neoplasm of right kidney, except renal pelvis: Secondary | ICD-10-CM

## 2022-01-03 ENCOUNTER — Encounter: Payer: Self-pay | Admitting: Nurse Practitioner

## 2022-01-03 ENCOUNTER — Telehealth: Payer: Self-pay | Admitting: *Deleted

## 2022-01-03 NOTE — Telephone Encounter (Addendum)
Reports feet are very tender and painful to walk. Denies any blisters or open areas. Took last axitinib last night (2 mg bid). Wishes to stop taking medication at this time (has #54 pills on hand). MD notified. ?Per Dr. Benay Spice: Hold axitinib till next visit. Patient notified and also instructed to lotion his feet often and a cool compress will help the pain. ?

## 2022-01-04 ENCOUNTER — Other Ambulatory Visit (HOSPITAL_COMMUNITY): Payer: Self-pay

## 2022-01-07 ENCOUNTER — Other Ambulatory Visit: Payer: Self-pay | Admitting: Oncology

## 2022-01-14 ENCOUNTER — Inpatient Hospital Stay (HOSPITAL_BASED_OUTPATIENT_CLINIC_OR_DEPARTMENT_OTHER)
Admission: AD | Admit: 2022-01-14 | Discharge: 2022-01-16 | DRG: 682 | Disposition: A | Discharge status: Home / Self Care | Payer: Medicare Other | Source: Ambulatory Visit | Attending: Family Medicine | Admitting: Family Medicine

## 2022-01-14 ENCOUNTER — Inpatient Hospital Stay (HOSPITAL_BASED_OUTPATIENT_CLINIC_OR_DEPARTMENT_OTHER): Payer: Medicare Other | Admitting: Oncology

## 2022-01-14 ENCOUNTER — Inpatient Hospital Stay: Payer: Medicare Other

## 2022-01-14 ENCOUNTER — Encounter (HOSPITAL_COMMUNITY): Payer: Self-pay | Admitting: Internal Medicine

## 2022-01-14 ENCOUNTER — Other Ambulatory Visit: Payer: Self-pay

## 2022-01-14 ENCOUNTER — Telehealth: Payer: Self-pay

## 2022-01-14 ENCOUNTER — Inpatient Hospital Stay (HOSPITAL_BASED_OUTPATIENT_CLINIC_OR_DEPARTMENT_OTHER)
Admission: RE | Admit: 2022-01-14 | Discharge: 2022-01-14 | Disposition: A | Discharge status: Home / Self Care | Payer: Medicare Other | Source: Ambulatory Visit | Attending: Oncology | Admitting: Oncology

## 2022-01-14 ENCOUNTER — Encounter: Payer: Self-pay | Admitting: *Deleted

## 2022-01-14 ENCOUNTER — Inpatient Hospital Stay: Payer: Medicare Other | Attending: Oncology

## 2022-01-14 VITALS — BP 108/67 | HR 84 | Temp 98.1°F | Resp 18 | Ht 65.0 in | Wt 144.2 lb

## 2022-01-14 DIAGNOSIS — N1831 Chronic kidney disease, stage 3a: Secondary | ICD-10-CM | POA: Diagnosis present

## 2022-01-14 DIAGNOSIS — N179 Acute kidney failure, unspecified: Principal | ICD-10-CM | POA: Diagnosis present

## 2022-01-14 DIAGNOSIS — Z7982 Long term (current) use of aspirin: Secondary | ICD-10-CM

## 2022-01-14 DIAGNOSIS — C641 Malignant neoplasm of right kidney, except renal pelvis: Secondary | ICD-10-CM

## 2022-01-14 DIAGNOSIS — Z8249 Family history of ischemic heart disease and other diseases of the circulatory system: Secondary | ICD-10-CM

## 2022-01-14 DIAGNOSIS — I10 Essential (primary) hypertension: Secondary | ICD-10-CM | POA: Diagnosis not present

## 2022-01-14 DIAGNOSIS — Z20822 Contact with and (suspected) exposure to covid-19: Secondary | ICD-10-CM | POA: Diagnosis present

## 2022-01-14 DIAGNOSIS — C7951 Secondary malignant neoplasm of bone: Secondary | ICD-10-CM | POA: Diagnosis present

## 2022-01-14 DIAGNOSIS — J189 Pneumonia, unspecified organism: Secondary | ICD-10-CM

## 2022-01-14 DIAGNOSIS — E119 Type 2 diabetes mellitus without complications: Secondary | ICD-10-CM

## 2022-01-14 DIAGNOSIS — Z79899 Other long term (current) drug therapy: Secondary | ICD-10-CM

## 2022-01-14 DIAGNOSIS — Z801 Family history of malignant neoplasm of trachea, bronchus and lung: Secondary | ICD-10-CM

## 2022-01-14 DIAGNOSIS — Z7952 Long term (current) use of systemic steroids: Secondary | ICD-10-CM | POA: Diagnosis not present

## 2022-01-14 DIAGNOSIS — M7989 Other specified soft tissue disorders: Secondary | ICD-10-CM

## 2022-01-14 DIAGNOSIS — N132 Hydronephrosis with renal and ureteral calculous obstruction: Secondary | ICD-10-CM | POA: Diagnosis present

## 2022-01-14 DIAGNOSIS — Z833 Family history of diabetes mellitus: Secondary | ICD-10-CM

## 2022-01-14 DIAGNOSIS — C61 Malignant neoplasm of prostate: Secondary | ICD-10-CM | POA: Diagnosis present

## 2022-01-14 DIAGNOSIS — E782 Mixed hyperlipidemia: Secondary | ICD-10-CM | POA: Diagnosis present

## 2022-01-14 DIAGNOSIS — C649 Malignant neoplasm of unspecified kidney, except renal pelvis: Secondary | ICD-10-CM | POA: Diagnosis present

## 2022-01-14 DIAGNOSIS — I129 Hypertensive chronic kidney disease with stage 1 through stage 4 chronic kidney disease, or unspecified chronic kidney disease: Secondary | ICD-10-CM | POA: Diagnosis present

## 2022-01-14 DIAGNOSIS — Z85068 Personal history of other malignant neoplasm of small intestine: Secondary | ICD-10-CM

## 2022-01-14 DIAGNOSIS — N1330 Unspecified hydronephrosis: Secondary | ICD-10-CM

## 2022-01-14 DIAGNOSIS — Z83438 Family history of other disorder of lipoprotein metabolism and other lipidemia: Secondary | ICD-10-CM

## 2022-01-14 DIAGNOSIS — E1122 Type 2 diabetes mellitus with diabetic chronic kidney disease: Secondary | ICD-10-CM | POA: Diagnosis present

## 2022-01-14 DIAGNOSIS — Z7984 Long term (current) use of oral hypoglycemic drugs: Secondary | ICD-10-CM

## 2022-01-14 LAB — CMP (CANCER CENTER ONLY)
ALT: 8 U/L (ref 0–44)
AST: 12 U/L — ABNORMAL LOW (ref 15–41)
Albumin: 4 g/dL (ref 3.5–5.0)
Alkaline Phosphatase: 40 U/L (ref 38–126)
Anion gap: 11 (ref 5–15)
BUN: 39 mg/dL — ABNORMAL HIGH (ref 8–23)
CO2: 20 mmol/L — ABNORMAL LOW (ref 22–32)
Calcium: 9.6 mg/dL (ref 8.9–10.3)
Chloride: 108 mmol/L (ref 98–111)
Creatinine: 3.13 mg/dL (ref 0.61–1.24)
GFR, Estimated: 21 mL/min — ABNORMAL LOW (ref >60)
Glucose, Bld: 147 mg/dL — ABNORMAL HIGH (ref 70–99)
Potassium: 4.4 mmol/L (ref 3.5–5.1)
Sodium: 139 mmol/L (ref 135–145)
Total Bilirubin: 0.6 mg/dL (ref 0.3–1.2)
Total Protein: 6.7 g/dL (ref 6.5–8.1)

## 2022-01-14 LAB — CBC WITH DIFFERENTIAL (CANCER CENTER ONLY)
Abs Immature Granulocytes: 0.01 10*3/uL (ref 0.00–0.07)
Basophils Absolute: 0 10*3/uL (ref 0.0–0.1)
Basophils Relative: 0 %
Eosinophils Absolute: 0.1 10*3/uL (ref 0.0–0.5)
Eosinophils Relative: 1 %
HCT: 28.8 % — ABNORMAL LOW (ref 39.0–52.0)
Hemoglobin: 8.5 g/dL — ABNORMAL LOW (ref 13.0–17.0)
Immature Granulocytes: 0 %
Lymphocytes Relative: 9 %
Lymphs Abs: 0.6 10*3/uL — ABNORMAL LOW (ref 0.7–4.0)
MCH: 29.1 pg (ref 26.0–34.0)
MCHC: 29.5 g/dL — ABNORMAL LOW (ref 30.0–36.0)
MCV: 98.6 fL (ref 80.0–100.0)
Monocytes Absolute: 0.7 10*3/uL (ref 0.1–1.0)
Monocytes Relative: 11 %
Neutro Abs: 4.8 10*3/uL (ref 1.7–7.7)
Neutrophils Relative %: 79 %
Platelet Count: 204 10*3/uL (ref 150–400)
RBC: 2.92 MIL/uL — ABNORMAL LOW (ref 4.22–5.81)
RDW: 15.5 % (ref 11.5–15.5)
WBC Count: 6.1 10*3/uL (ref 4.0–10.5)
nRBC: 0 % (ref 0.0–0.2)

## 2022-01-14 LAB — URINALYSIS, ROUTINE W REFLEX MICROSCOPIC
Bilirubin Urine: NEGATIVE
Glucose, UA: 50 mg/dL — AB
Ketones, ur: NEGATIVE mg/dL
Leukocytes,Ua: NEGATIVE
Nitrite: NEGATIVE
Protein, ur: NEGATIVE mg/dL
Specific Gravity, Urine: 1.006 (ref 1.005–1.030)
pH: 5 (ref 5.0–8.0)

## 2022-01-14 LAB — RESP PANEL BY RT-PCR (FLU A&B, COVID) ARPGX2
Influenza A by PCR: NEGATIVE
Influenza B by PCR: NEGATIVE
SARS Coronavirus 2 by RT PCR: NEGATIVE

## 2022-01-14 LAB — TSH: TSH: 2.223 u[IU]/mL (ref 0.350–4.500)

## 2022-01-14 LAB — GLUCOSE, CAPILLARY: Glucose-Capillary: 143 mg/dL — ABNORMAL HIGH (ref 70–99)

## 2022-01-14 MED ORDER — ACETAMINOPHEN 325 MG PO TABS: 650.0000 mg | ORAL_TABLET | Freq: Four times a day (QID) | ORAL | Status: DC | PRN

## 2022-01-14 MED ORDER — TRAMADOL HCL 50 MG PO TABS: 50.0000 mg | ORAL_TABLET | Freq: Two times a day (BID) | ORAL | Status: DC | PRN

## 2022-01-14 MED ORDER — TAMSULOSIN HCL 0.4 MG PO CAPS
0.4000 mg | ORAL_CAPSULE | Freq: Every day | ORAL | Status: DC
  Administered 2022-01-14 – 2022-01-15 (×2): 0.4 mg via ORAL

## 2022-01-14 MED ORDER — FERROUS SULFATE 325 (65 FE) MG PO TABS
325.0000 mg | ORAL_TABLET | Freq: Three times a day (TID) | ORAL | Status: DC
  Administered 2022-01-15 – 2022-01-16 (×4): 325 mg via ORAL

## 2022-01-14 MED ORDER — LEVOFLOXACIN IN D5W 500 MG/100ML IV SOLN
500.0000 mg | INTRAVENOUS | Status: DC
  Administered 2022-01-14: 500 mg via INTRAVENOUS

## 2022-01-14 MED ORDER — ROSUVASTATIN CALCIUM 10 MG PO TABS
10.0000 mg | ORAL_TABLET | Freq: Every morning | ORAL | Status: DC
  Administered 2022-01-15 – 2022-01-16 (×2): 10 mg via ORAL

## 2022-01-14 MED ORDER — HEPARIN SODIUM (PORCINE) 5000 UNIT/ML IJ SOLN
5000.0000 [IU] | Freq: Three times a day (TID) | INTRAMUSCULAR | Status: DC
  Administered 2022-01-14 – 2022-01-16 (×5): 5000 [IU] via SUBCUTANEOUS

## 2022-01-14 MED ORDER — AMLODIPINE BESYLATE 10 MG PO TABS
10.0000 mg | ORAL_TABLET | Freq: Every day | ORAL | Status: DC
  Administered 2022-01-15 – 2022-01-16 (×2): 10 mg via ORAL

## 2022-01-14 MED ORDER — DOCUSATE SODIUM 100 MG PO CAPS: 100.0000 mg | ORAL_CAPSULE | Freq: Every day | ORAL | Status: DC | PRN

## 2022-01-14 MED ORDER — LACTATED RINGERS IV SOLN: INTRAVENOUS | Status: AC

## 2022-01-14 MED ORDER — LOPERAMIDE HCL 2 MG PO CAPS: 2.0000 mg | ORAL_CAPSULE | Freq: Every day | ORAL | Status: DC | PRN

## 2022-01-14 NOTE — Assessment & Plan Note (Signed)
Continue statin. 

## 2022-01-14 NOTE — Progress Notes (Signed)
?Brandon Peters ?OFFICE PROGRESS NOTE ? ? ?Diagnosis: Renal cell carcinoma, prostate cancer ? ?INTERVAL HISTORY:  ? ?Brandon Peters returns as scheduled.  He discontinued axitinib beginning 01/03/2022 secondary to recurrent foot pain. ?He developed left abdomen and left lower back pain yesterday.  He had an episode of emesis this morning.  He reports an occasional episode of diarrhea relieved with Imodium. ?He feels that he is getting a "cold ". ?He underwent restaging imaging at [facility] yesterday and is scheduled for a follow-up appointment next week.  The MRI of the abdomen revealed a stable right lower pole renal mass and renal vein thrombus.  The bulk of thrombus in the IVC at the level of the renal veins is increased, the extent of thrombus has decreased now extending to the intrahepatic IVC.  The right lower pole mass and venous thrombus enhancement has decreased.  There is new mild left hydronephrosis at the level of the distal ureter with asymmetric perinephric stranding concerning for acute obstruction. ?A chest CT revealed scarring and atelectasis in the left lung. ? ? ?Objective: ? ?Vital signs in last 24 hours: ? ?Blood pressure 108/67, pulse 84, temperature 98.1 ?F (36.7 ?C), temperature source Oral, resp. rate 18, height _0  (1.651 m), weight 144 lb 3.2 oz (65.4 kg), SpO2 100 %. ?  ?Resp: Lungs clear bilaterally ?Cardio: Regular rate and rhythm ?GI: No hepatosplenomegaly, no mass, soft, nontender ?Vascular: No leg edema  ?Skin: Dryness with superficial desquamation and callus formation at the soles ? ? ?Lab Results: ? ?Lab Results  ?Component Value Date  ? WBC 6.1 01/14/2022  ? HGB 8.5 (L) 01/14/2022  ? HCT 28.8 (L) 01/14/2022  ? MCV 98.6 01/14/2022  ? PLT 204 01/14/2022  ? NEUTROABS 4.8 01/14/2022  ? ? ?CMP  ?Lab Results  ?Component Value Date  ? NA 139 01/14/2022  ? K 4.4 01/14/2022  ? CL 108 01/14/2022  ? CO2 20 (L) 01/14/2022  ? GLUCOSE 147 (H) 01/14/2022  ? BUN 39 (H) 01/14/2022  ? CREATININE  3.13 (HH) 01/14/2022  ? CALCIUM 9.6 01/14/2022  ? PROT 6.7 01/14/2022  ? ALBUMIN 4.0 01/14/2022  ? AST 12 (L) 01/14/2022  ? ALT 8 01/14/2022  ? ALKPHOS 40 01/14/2022  ? BILITOT 0.6 01/14/2022  ? GFRNONAA 21 (L) 01/14/2022  ? GFRAA >60 03/05/2020  ? ? ?Lab Results  ?Component Value Date  ? CEA1 3.31 05/03/2019  ? ? ? ?Imaging: ? ?CT RENAL STONE STUDY ? ?Result Date: 01/14/2022 ?CLINICAL DATA:  Left flank pain since Wednesday. History of right-sided renal cell carcinoma. History of small-bowel cancer with surgery. EXAM: CT ABDOMEN AND PELVIS WITHOUT CONTRAST TECHNIQUE: Multidetector CT imaging of the abdomen and pelvis was performed following the standard protocol without IV contrast. RADIATION DOSE REDUCTION: This exam was performed according to the departmental dose-optimization program which includes automated exposure control, adjustment of the mA and/or kV according to patient size and/or use of iterative reconstruction technique. COMPARISON:  08/12/2021 MRI.  CT 12/02/2019 FINDINGS: Lower chest: Inferior right middle lobe collapse/consolidative change, new. Minimal lingular and anterior left lower lobe scarring. Normal heart size without pericardial or pleural effusion. Hepatobiliary: Normal noncontrast appearance of the liver, biliary tree, gallbladder. Pancreas: Fatty replaced pancreatic head and uncinate process. No duct dilatation or acute inflammation. Spleen: Normal in size, without focal abnormality. Adrenals/Urinary Tract: Normal adrenal glands. Lower pole right renal 4.1 x 4.2 cm on 33/2 has enlarged from 3.6 x 3.7 cm on 07/29/2021. The heterogeneous hyperattenuation on that  exam has resolved. Multiple left renal collecting system calculi of maximally 3-4 mm. Mild left-sided hydroureteronephrosis which continues to the level of a 2 mm left ureterovesicular junction stone on 70/2. Pelvic floor laxity with mild cystocele. Edema and mild fluid along the left iliopsoas muscle including on 53/2 is favored to  be secondary to the urinary tract obstruction and may represent an atypical inferior extension of forniceal rupture. Stomach/Bowel: Normal stomach, without wall thickening. Colonic stool burden suggests constipation. Normal terminal ileum and appendix. Normal small bowel caliber. Hyperattenuating foci within the right pelvic small bowel including on 66/2 are most likely pills. Vascular/Lymphatic: Aortic atherosclerosis. No abdominopelvic adenopathy. Reproductive: Normal prostate. Other: No significant free fluid.  No free intraperitoneal air. Musculoskeletal: Diffuse sclerotic osseous metastasis. Relatively similar to 07/29/2021 abdominal CT. IMPRESSION: 1. Mild left-sided hydroureteronephrosis secondary to a 2 mm stone at the left ureterovesicular junction. 2. Left nephrolithiasis. 3. Mild enlargement of a lower pole right renal mass, biopsied on 10/13/2021. 4. New inferior right middle lobe collapse/consolidation. Correlate with infectious symptoms. Interval atelectasis or scarring possible but felt less likely. 5. Edema/fluid along the left iliopsoas muscle is most likely related to the urinary tract obstruction. Question inferior tracking of forniceal rupture. 6.  Possible constipation. 7. Diffuse sclerotic prostate metastasis. Electronically Signed   By: Abigail Miyamoto M.D.   On: 01/14/2022 11:58   ? ?Medications: I have reviewed the patient's current medications. ? ? ?Assessment/Plan: ? ?History of Severe anemia-status post a red cell transfusion 03/04/2016 ?2.. Prostate cancer ?Extensive sclerotic bone metastases noted on a CT of the abdomen/pelvis 03/04/2016 and MRI abdomen 03/05/2016 ?Markedly elevated PSA ?Lupron initiated 03/15/2016; Casodex ?14 days beginning 03/15/2016 ?Prostate biopsy 03/18/2016 ?05/09/2016 PSA significantly improved at 21 ?Abiraterone initiated 05/10/2016 ?PSA less than 0.1 07/10/2018 ?PSA less than 0.1 08/30/2018 ?PSA less than 0.1 10/18/2018 ?PSA less than 0.1 11/29/2018 ?PSA less than  0.1 on 11/06/2019  ?PSA less than 0.1 on 03/05/2020 ?PSA less than 0.1 on 07/06/2020 ?PSA less than 0.1 08/17/2021 ?  ?3.   Symptoms of obstructive uropathy. Improved ?  ?4.   Distal duodenal and descending colon masses ?biopsy of the duodenal mass 03/04/2016 confirmed fragments of small bowel mucosa with focal high-grade dysplasia ?Biopsy of the descending colon "polyps" revealed fragments of a tubulovillous adenoma ?CT 08/29/2017-similar soft tissue thickening in the region of the splenic flexure of the colon suboptimally evaluated due to lack of colonic enteric contrast ?Referred to Dr. Benson Norway ?Referred to [facility] ?Resection of descending colon polyps 01/31/2018, tubulovillous adenomas with high-grade dysplasia ?Attempted endoscopic resection of the fourth segment duodenal "polyp" on 05/09/2018, firm unresectable lesion, biopsy revealed moderately differentiated adenocarcinoma ?CT abdomen/pelvis 05/25/2018- heterogeneously enhancing right lower pole renal lesion; diffuse sclerotic osseous metastatic lesions compatible with history of prostate cancer ?CT chest 05/25/2018- no pulmonary metastasis.  Diffuse osseous metastasis involving the axial and appendicular skeleton. ?06/19/2018-sleeve resection of third and fourth portions of duodenum with primary end-to-end anastomosis, feeding gastrojejunostomy tube placement, Dr. Cloyd Stagers; poorly differentiated adenocarcinoma duodenum, 3 cm; tumor invades the muscularis propria; lymphovascular invasion present; negative margins; 4 of 11 involved lymph nodes; soft tissue deposits also seen in the mesentery; all mucosal margins and soft tissue margins negative but 2 of the positive nodes are found in the soft tissue margin submitted for frozen section (pT2 pN2); second mucosal lesion 1.2 cm from the proximal margin, tubular adenoma with focal severe dysplasia ?Intact expression of mismatch repair proteins by IHC; microsatellite stable ?Cycle 1 adjuvant Xeloda 07/23/2018 ?Cycle 2 adjuvant  Xeloda 08/13/2018 ?Cycle 3 adjuvant Xeloda 09/10/2018 (dose reduced to 1000 mg twice daily for 14 days)  ?Cycle 4 adjuvant Xeloda 10/01/2018 (dose increased to 1500 mg every morning and 1000 mg every afternoo

## 2022-01-14 NOTE — Progress Notes (Signed)
PHARMACY NOTE:  ANTIMICROBIAL RENAL DOSAGE ADJUSTMENT ? ?Current antimicrobial regimen includes a mismatch between antimicrobial dosage and estimated renal function.  As per policy approved by the Pharmacy & Therapeutics and Medical Executive Committees, the antimicrobial dosage will be adjusted accordingly. ? ?Current antimicrobial dosage:  Levaquin '750mg'$  IV q24h ? ?Indication: pneumonia ? ?Renal Function: ? ?Estimated Creatinine Clearance: 20.7 mL/min (A) (by C-G formula based on SCr of 3.13 mg/dL [facility])). ?'[]'$      On intermittent HD, scheduled: ?'[]'$      On CRRT ?   ?Antimicrobial dosage has been changed to:  '500mg'$  IV q48h ? ?Additional comments: ? ? ?Thank you for allowing pharmacy to be a part of this patient's care. ? ?Peggyann Juba, PharmD, BCPS ?Pharmacy: [phone] ?01/14/2022 7:35 PM ? ? ?  ? ? ?

## 2022-01-14 NOTE — Assessment & Plan Note (Addendum)
CT with new inferior right middle lobe collapse/consolidation ?CXR with streaky peribronchial opacities in bilateral lung bases (atelectasis vs atypical pneumonia) ?Will complete course of abx, follow with Dr. Benay Spice outpatient ?

## 2022-01-14 NOTE — H&P (Addendum)
?History and Physical  ? ? ?Patient: Brandon Peters JTT:[id number] DOB: Jan 19, 1956 ?DOA: 01/14/2022 ?DOS: the patient was seen and examined on 01/14/2022 ?PCP: Ashley Akin, NP  ?Patient coming from:  Direct admit -oncology ? ?Chief Complaint: No chief complaint on file. ? ?HPI: Brandon Peters is a 66 y.o. male with medical history significant of prostate cancer, right renal cell carcinoma, hypertension, type 2 diabetes and hyperlipidemia who presents as a direct admission from oncology with CT imaging showing new left hydroureteronephrosis with 67m left UVJ stone and new right middle lobe collapse/consolidation changes.  ? ?He reports diarrhea about 4 days ago that resolved with Imodium. He then developed intermittent left sided abdominal and flank pain. Denies dysuria or hematuria. Has nausea and had episode of vomiting yesterday that actually made him feel better. Yesterday also noticed a new mildly productive cough, has chronic runny nose. Felt hot and chills at one point.  ? ?He was sent in for admission by oncology for IV hydration and formal urology consult in the morning.  Dr. SBenay Spicehas already spoken with urology service and suspect his stone may pass spontaneously. ? ?CBC showing no leukocytosis with stable baseline anemia with hemoglobin of 8.5.  Creatinine is elevated at 3.13 up from 1.72 several weeks ago.  Other electrolytes otherwise.  ? ?Review of Systems: As mentioned in the history of present illness. All other systems reviewed and are negative. ?Past Medical History:  ?Diagnosis Date  ? [place]   ? d/t lisinopril and/or diazide- was on both in a 2 week period- cr up to 4.99  ? Diabetes mellitus   ? Family history of lung cancer   ? Hyperlipidemia   ? Hypertension   ? Personal history of other malignant neoplasm of small intestine   ? RENAL CALCULUS, HX OF 06/26/2007  ? ?Past Surgical History:  ?Procedure Laterality Date  ? COLONOSCOPY N/A 03/04/2016  ? Procedure: COLONOSCOPY;   Surgeon: PCarol Ada MD;  [facility];  Service: Endoscopy;  Laterality: N/A;  ? ENTEROSCOPY N/A 03/04/2016  ? Procedure: ENTEROSCOPY;  Surgeon: PCarol Ada MD;  [facility]  Service: Endoscopy;  Laterality: N/A;  ? ?Social History:  reports that he has never smoked. He has never used smokeless tobacco. He reports that he does not drink alcohol and does not use drugs. ? ?Allergies  ?Allergen Reactions  ? Penicillins Other (See Comments)  ?  Pt states that he cannot really remember reaction. Tentatively reports swelling/hives  ? ? ?Family History  ?Problem Relation Age of Onset  ? Cancer Mother   ? Diabetes Mother   ? Hypertension Mother   ? Coronary artery disease Mother   ? Hyperlipidemia Sister   ? Hypertension Sister   ? ? ?Prior to Admission medications   ?Medication Sig Start Date End Date Taking? Authorizing Provider  ?abiraterone acetate (ZYTIGA) 250 MG tablet TAKE 4 TABLETS BY MOUTH ONCE DAILY AS DIRECTED.  TAKE 1 HOUR BEFORE OR 2 HOURS AFTER A MEAL 11/18/21   SLadell Pier MD  ?amLODipine (NORVASC) 10 MG tablet Take 10 mg by mouth daily. 04/21/16   [provider]  ?aspirin EC 81 MG tablet Take 81 mg daily by mouth. ?Patient not taking: Reported on 12/14/2021    [provider]  ?axitinib (INLYTA) 1 MG tablet Take 2 tablets (2 mg total) by mouth 2 (two) times daily. 12/15/21   SLadell Pier MD  ?cetirizine (ZYRTEC) 10 MG tablet Take 10 mg by mouth daily  as needed for allergies (takes as needed).  ?Patient not taking: Reported on 12/03/2021    [provider]  ?docusate sodium (COLACE) 100 MG capsule Take 100 mg by mouth daily as needed.    [provider]  ?ferrous sulfate 325 (65 FE) MG EC tablet Take 1 tablet (325 mg total) by mouth 3 (three) times daily with meals. 11/06/19   Ladell Pier, MD  ?loperamide (IMODIUM) 2 MG capsule Take by mouth as needed for diarrhea or loose stools.    [provider]  ?metFORMIN (GLUCOPHAGE) 500  MG tablet Take 1 tablet (500 mg total) by mouth 2 (two) times daily with a meal. 03/06/16   Norman Herrlich, MD  ?Potassium Chloride ER 20 MEQ TBCR Take 1 tablet by mouth 2 (two) times daily. 12/03/21   Ladell Pier, MD  ?predniSONE (DELTASONE) 5 MG tablet Take 1 tablet by mouth once daily with breakfast 11/24/21   Ladell Pier, MD  ?rosuvastatin (CRESTOR) 10 MG tablet Take 10 mg by mouth every morning. 02/23/21   [provider]  ?tamsulosin (FLOMAX) 0.4 MG CAPS capsule Take 0.4 mg by mouth at bedtime. 05/02/16   [provider]  ? ? ?Physical Exam: ?Vitals:  ? 01/14/22 1801  ?BP: 129/75  ?Pulse: 87  ?Resp: 18  ?Temp: 98 ?F (36.7 ?C)  ?TempSrc: Oral  ?SpO2: 99%  ?Weight: 65.5 kg  ?Height: '5\' 5"'$  (1.651 m)  ? ?Constitutional: NAD, calm, comfortable, thin elderly male nontoxic and well-appearing sitting upright in bed  ?eyes: lids and conjunctivae normal ?ENMT: Mucous membranes are moist.  ?Neck: normal, supple,  ?Respiratory: clear to auscultation bilaterally, no wheezing, no crackles. Normal respiratory effort.  ?Cardiovascular: Regular rate and rhythm, no murmurs / rubs / gallops. No extremity edema.  ?Abdomen: Soft, nondistended no tenderness, no masses palpated. Bowel sounds positive.  No flank pain. ?Musculoskeletal: no clubbing / cyanosis. No joint deformity upper and lower extremities. Good ROM, no contractures. Normal muscle tone.  ?Skin: no rashes, lesions, ulcers. No induration ?Neurologic: CN 2-12 grossly intact.  Strength 5/5 in all 4.  ?Psychiatric: Normal judgment and insight. Alert and oriented x 3. Normal mood. ?Data Reviewed: ? ?See HPI ? ?Assessment and Plan: ?* AKI (acute kidney injury) ([place]) ?- Secondary to left hydroureteronephrosis from 2 mm left UVJ stone ?- Creatinine elevated to 3.13 from prior of 1.72 ?-continuous IV fluid hydration overnight ?-strain all urine-MRI abdomen yesterday showed new mild left hydroureteronephrosis at least to the level of the distal ureter,  imaging today shows it at left UVJ so appears to be migrating and potentially could pass spontaneously ?-continue Flomax  ?-Urology has been contacted by oncology and will need to formally consult in the morning ? ?CAP (community acquired pneumonia) ?new right middle lobe collapse/consolidation changes see on CT abdomen today that was not seen on CT chest yesterday at [facility]. Has symptoms of new productive cough.  ?-Start Levaquin IV due to unknown PCN allergy and no records of previous Cephalosporin use.  ? ?Renal cell carcinoma ([place]) ?Hold axitinib and pembrolizumab per oncology ?-follows with [facility] urology  ?Had MRI of the abdomen yesterday showing stable right lower pole renal mass with overall decreased extent of tumor thrombus in IVC. ? ? ? ?Prostatic cancer ([place]) ?hold abiraterone and prednisone per oncology ?Follows with Dr. Benay Spice ? ?Essential hypertension ?Continue amlodipine ? ?HYPERLIPIDEMIA, MIXED, WITH LOW HDL ?Continue statin ? ? ? ? ? Advance Care Planning:   Code Status: Full Code  ? ?  Consults: Oncology Dr. Benay Spice to see if he remains inpatient on 4/24. ?Urology has been contacted by oncology and will need formal consult in the morning ? ?Family Communication: Discussed with 2 sisters at bedside ? ?Severity of Illness: ?The appropriate patient status for this patient is INPATIENT. Inpatient status is judged to be reasonable and necessary in order to provide the required intensity of service to ensure the patient's safety. The patient's presenting symptoms, physical exam findings, and initial radiographic and laboratory data in the context of their chronic comorbidities is felt to place them at high risk for further clinical deterioration. Furthermore, it is not anticipated that the patient will be medically stable for discharge from the hospital within 2 midnights of admission.  ? ?* I certify that at the point of admission it is my clinical judgment that the patient will require inpatient hospital  care spanning beyond 2 midnights from the point of admission due to high intensity of service, high risk for further deterioration and high frequency of surveillance required.* ? ?Author: ?Orene Desanctis, DO ?4

## 2022-01-14 NOTE — Assessment & Plan Note (Addendum)
Secondary to left hydroureteronephrosis from 2 mm left UVJ stone ?Creatinine elevated to 3.13 ?Baseline creatinine appears ~1.6-1.7ish ?Improved today to 2.35 (continue to monitor) ?Continue IVF ?Urology c/s, appreciate recs -> planning for observation with f/u with Dr. Gloriann Loan in 3-4 weeks with renal US ?continue Flomax  ?Stone passed ?CT 4/22 showed mild L hydroureteronephrosis 2/2 Damya Comley 2 mm stone at the L UVJ.  L nephrolithiasis.  Mild enlargement of lower pole R renal mass.  New inferior right middle lobe collapse/consolidation.  Edema/fluid along the L iliopsoas muscle likely related to urinary tract obstruction.  Diffuse sclerotic prostate metastasis. ?

## 2022-01-14 NOTE — Telephone Encounter (Signed)
Called and spoke Brandon Peters in bed placement and request a telemetry bed for the patient. Brandon Peters stated the patient will be on [address] and he will called the patient will the room is ready. ?

## 2022-01-14 NOTE — Assessment & Plan Note (Signed)
-   Continue amlodipine ?

## 2022-01-14 NOTE — Progress Notes (Deleted)
?Brandon Peters ?OFFICE PROGRESS NOTE ? ? ?Diagnosis:  ? ?INTERVAL HISTORY:  ? ?*** ? ?Objective: ? ?Vital signs in last 24 hours: ? ?Blood pressure 108/67, pulse 84, temperature 98.1 ?F (36.7 ?C), temperature source Oral, resp. rate 18, height '5\' 5"'  (1.651 m), weight 144 lb 3.2 oz (65.4 kg), SpO2 100 %. ?  ? ?HEENT: *** ?Lymphatics: *** ?Resp: *** ?Cardio: *** ?GI: *** ?Vascular: *** ?Neuro:***  ?Skin:***  ? ?Portacath/PICC-without erythema ? ?Lab Results: ? ?Lab Results  ?Component Value Date  ? WBC 6.1 01/14/2022  ? HGB 8.5 (L) 01/14/2022  ? HCT 28.8 (L) 01/14/2022  ? MCV 98.6 01/14/2022  ? PLT 204 01/14/2022  ? NEUTROABS 4.8 01/14/2022  ? ? ?CMP  ?Lab Results  ?Component Value Date  ? NA 142 12/24/2021  ? K 3.6 12/24/2021  ? CL 108 12/24/2021  ? CO2 22 12/24/2021  ? GLUCOSE 119 (H) 12/24/2021  ? BUN 28 (H) 12/24/2021  ? CREATININE 1.72 (H) 12/24/2021  ? CALCIUM 9.5 12/24/2021  ? PROT 7.6 12/24/2021  ? ALBUMIN 4.2 12/24/2021  ? AST 16 12/24/2021  ? ALT 14 12/24/2021  ? ALKPHOS 61 12/24/2021  ? BILITOT 0.5 12/24/2021  ? GFRNONAA 44 (L) 12/24/2021  ? GFRAA >60 03/05/2020  ? ? ?Lab Results  ?Component Value Date  ? CEA1 3.31 05/03/2019  ? ? ?Medications: I have reviewed the patient's current medications. ? ? ?Assessment/Plan: ?History of Severe anemia-status post a red cell transfusion 03/04/2016 ?2.. Prostate cancer ?Extensive sclerotic bone metastases noted on a CT of the abdomen/pelvis 03/04/2016 and MRI abdomen 03/05/2016 ?Markedly elevated PSA ?Lupron initiated 03/15/2016; Casodex ?14 days beginning 03/15/2016 ?Prostate biopsy 03/18/2016 ?05/09/2016 PSA significantly improved at 21 ?Abiraterone initiated 05/10/2016 ?PSA less than 0.1 07/10/2018 ?PSA less than 0.1 08/30/2018 ?PSA less than 0.1 10/18/2018 ?PSA less than 0.1 11/29/2018 ?PSA less than 0.1 on 11/06/2019  ?PSA less than 0.1 on 03/05/2020 ?PSA less than 0.1 on 07/06/2020 ?PSA less than 0.1 08/17/2021 ?  ?3.   Symptoms of obstructive uropathy.  Improved ?  ?4.   Distal duodenal and descending colon masses ?biopsy of the duodenal mass 03/04/2016 confirmed fragments of small bowel mucosa with focal high-grade dysplasia ?Biopsy of the descending colon "polyps" revealed fragments of a tubulovillous adenoma ?CT 08/29/2017-similar soft tissue thickening in the region of the splenic flexure of the colon suboptimally evaluated due to lack of colonic enteric contrast ?Referred to Dr. Benson Norway ?Referred to [facility] ?Resection of descending colon polyps 01/31/2018, tubulovillous adenomas with high-grade dysplasia ?Attempted endoscopic resection of the fourth segment duodenal "polyp" on 05/09/2018, firm unresectable lesion, biopsy revealed moderately differentiated adenocarcinoma ?CT abdomen/pelvis 05/25/2018- heterogeneously enhancing right lower pole renal lesion; diffuse sclerotic osseous metastatic lesions compatible with history of prostate cancer ?CT chest 05/25/2018- no pulmonary metastasis.  Diffuse osseous metastasis involving the axial and appendicular skeleton. ?06/19/2018-sleeve resection of third and fourth portions of duodenum with primary end-to-end anastomosis, feeding gastrojejunostomy tube placement, Dr. Cloyd Stagers; poorly differentiated adenocarcinoma duodenum, 3 cm; tumor invades the muscularis propria; lymphovascular invasion present; negative margins; 4 of 11 involved lymph nodes; soft tissue deposits also seen in the mesentery; all mucosal margins and soft tissue margins negative but 2 of the positive nodes are found in the soft tissue margin submitted for frozen section (pT2 pN2); second mucosal lesion 1.2 cm from the proximal margin, tubular adenoma with focal severe dysplasia ?Intact expression of mismatch repair proteins by IHC; microsatellite stable ?Cycle 1 adjuvant Xeloda 07/23/2018 ?Cycle 2 adjuvant Xeloda 08/13/2018 ?  Cycle 3 adjuvant Xeloda 09/10/2018 (dose reduced to 1000 mg twice daily for 14 days)  ?Cycle 4 adjuvant Xeloda 10/01/2018 (dose increased to  1500 mg every morning and 1000 mg every afternoon for 14 days) ?Cycle 5 adjuvant Xeloda 10/22/2018 ?Cycle 6 adjuvant Xeloda 11/12/2018 ?Cycle 7 adjuvant Xeloda 12/03/2018 ?Cycle 8 adjuvant Xeloda 12/24/2018 ?  ?5.  Anorexia/weight loss- resolved ?  ?6.  Undescended testicles ?  ?7.  Right renal mass.  CT 08/29/2017-right renal mass involving the lower pole was partially calcified and does demonstrate enhancement following contrast most consistent with renal cell carcinoma.  Lesion not significantly enlarged compared with the prior CT. Followed by urology. ?Renal ultrasound 07/06/2021-enlargement of right renal mass ?CT abdomen 09/28/2020-large minute heterogenously enhancing mass in the inferior pole the right kidney with new fullness of the inferior branch of the right renal vein, no enlarged abdominal lymph nodes, unchanged sclerotic osseous metastatic disease ?MRI abdomen 08/12/2021-heterogenously enhancing mass in the lower pole right kidney extending into the central sinus fat and posteriorly has mass-effect on the right psoas muscle without definite invasion, tumor thrombus in the right renal vein extending to the IVC/RA junction, upper normal size retrocaval node ?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  ?8.  Diabetes ?  ?9.  Anemia-stool Hemoccults + March 2021, referred to Dr. Benson Norway for endoscopic evaluation, anemia also secondary to metastatic prostate cancer, hypogonadism ?Small bowel enteroscopy 02/11/2020-esophagus normal.  Hematin found in the gastric body.  Examined duodenum normal.  No  evidence of significant pathology in the entire examined portion of the jejunum.  There was no overt source of bleeding or inflammation.  The bleeding may be from small AVMs.  Surgical anastomosis was not visible with repeated inspection of the duodenum. ?Colonoscopy 02/11/2020-multiple sessile and semipedunculated polyps were found in the rectum, descending colon and ascending colon, 3 to 18 mm in size.  Polyps removed (polypectomy descending colon-fragments of tubular adenoma, inflammatory type polyp; polypectomy ascending colon-tubular adenoma, no high-grade dysplasia or malignancy; polypectomy descending colon-fragments of tubulovillous adenoma, no high-grade dysplasia or malignancy; polypectomy colon, rectum-tubular adenoma, no high-grade dysplasia or malignancy). ?Colonoscopy 02/02/2021-seven 2 to 10 mm polyps in the sigmoid colon (tubular adenoma), descending colon (fragments of tubulovillous adenoma, fragments of tubular adenoma ) and transverse colon (fragments of tubular adenoma). ?Progressive anemia 07/05/2021; stool cards positive ?Upper endoscopy/colonoscopy 09/23/2021-multiple polyps removed from the colon-tubular adenomas, diffuse angioectasias in the stomach with bleeding-not amenable to endoscopic treatment ?  ? ? ?Disposition: ?*** ? ?[name], MD ? ?01/14/2022  ?10:46 AM ? ? ?

## 2022-01-14 NOTE — Assessment & Plan Note (Addendum)
Hold axitinib and pembrolizumab per oncology ?-follows with [facility] urology  ?Had MRI of the abdomen 4/21 grossly similar size of right lower pole renal mass with renal vein thrombus.  Suspect this is tumor thrombus based on prior documentation.  Follow with oncology outpatient. ? ? ?

## 2022-01-14 NOTE — Assessment & Plan Note (Addendum)
hold abiraterone and prednisone per oncology ?Follows with Dr. Benay Spice ?CXR with abnormal appearance of osseous structures with areas of sclerotic and lytic changes throughout the thorax, concerning for osseous metastatic disease -> suspect this is related to his known bone mets (per Dr. Learta Codding note from 4/21, extensive sclortic bone metastasis noted on CT abd/pelvis from 02/2016 - ct chest 05/25/2018 with diffuse lytic sclerotic lesions involving the axial and appendicular skeleton)  Will defer additional imaging to Dr. Benay Spice.  ?

## 2022-01-14 NOTE — Progress Notes (Signed)
Patient seen by Dr. Benay Spice today ? ?Vitals are within treatment parameters. ?BP is lower than his usual ? ?Labs reviewed by Dr. Benay Spice and are not all within treatment parameters.   ? ?Per physician team, patient will not be receiving treatment today.  ? ?CRITICAL VALUE STICKER ? ?CRITICAL VALUE: Creatinine 3.13 ? ?RECEIVER (on-site recipient of call):Brandon Reznick,RN ? ?DATE & TIME NOTIFIED: 01/14/22 @ 1048 ? ?MESSENGER (representative from lab):Simona Huh  ? ?MD NOTIFIED: Dr. Benay Spice ? ?TIME OF NOTIFICATION: 1050 ? ?RESPONSE: Stat CT abd/pelvis ? ?

## 2022-01-15 ENCOUNTER — Inpatient Hospital Stay (HOSPITAL_COMMUNITY): Payer: Medicare Other

## 2022-01-15 DIAGNOSIS — N1831 Chronic kidney disease, stage 3a: Secondary | ICD-10-CM

## 2022-01-15 LAB — BASIC METABOLIC PANEL
Anion gap: 7 (ref 5–15)
BUN: 35 mg/dL — ABNORMAL HIGH (ref 8–23)
CO2: 21 mmol/L — ABNORMAL LOW (ref 22–32)
Calcium: 9.1 mg/dL (ref 8.9–10.3)
Chloride: 112 mmol/L — ABNORMAL HIGH (ref 98–111)
Creatinine, Ser: 2.43 mg/dL — ABNORMAL HIGH (ref 0.61–1.24)
GFR, Estimated: 29 mL/min — ABNORMAL LOW (ref >60)
Glucose, Bld: 91 mg/dL (ref 70–99)
Potassium: 3.7 mmol/L (ref 3.5–5.1)
Sodium: 140 mmol/L (ref 135–145)

## 2022-01-15 MED ORDER — LEVOFLOXACIN 750 MG PO TABS: 750.0000 mg | ORAL_TABLET | ORAL | Status: DC

## 2022-01-15 NOTE — Consult Note (Signed)
Urology Consult  ?Referring physician: Dr. Florene Glen ?Reason for referral: left ureteral calculus ? ?Chief Complaint: left flank pain ? ?History of Present Illness: Mr Brandon Peters is a 66yo who was admitted with acute renal failure and left ureteral calculus. 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 He was seen in oncology clinic and Creatinine was 3.1. He was having abdominal pain at that time and was direct admitted to [facility]. CT stone study showed multiple left renal calculi and a 2-6m left distal ureteral calculus. He passed his stone this morning and his left flank pain has resolved. He denies any significant LUTS. He denies any flank pain. Creatinine improved to 2.43.  ? ?Past Medical History:  ?Diagnosis Date  ? AR[place] (acute renal [place]) ([place]   ? d/t lisinopril and/or diazide- was on both in a 2 week period- cr up to 4.99  ? Diabetes mellitus   ? Family history of lung cancer   ? Hyperlipidemia   ? Hypertension   ? Personal history of other malignant neoplasm of small intestine   ? RENAL CALCULUS, HX OF 06/26/2007  ? ?Past Surgical History:  ?Procedure Laterality Date  ? COLONOSCOPY N/A 03/04/2016  ? Procedure: COLONOSCOPY;  Surgeon: PCarol Ada MD;  [facility];  Service: Endoscopy;  Laterality: N/A;  ? ENTEROSCOPY N/A 03/04/2016  ? Procedure: ENTEROSCOPY;  Surgeon: PCarol Ada MD;  [place] [facility]  Service: Endoscopy;  Laterality: N/A;  ? ? ?Medications: I have reviewed the patient's current medications. ?Allergies:  ?Allergies  ?Allergen Reactions  ? Penicillins Other (See Comments)  ?  Pt states that he cannot really remember reaction. Tentatively reports swelling/hives  ? ? ?Family History  ?Problem Relation Age of Onset  ? Cancer Mother   ? Diabetes Mother   ? Hypertension Mother   ? Coronary artery disease Mother   ? Hyperlipidemia Sister   ? Hypertension Sister   ? ?Social History:  reports that he has never smoked. He has never used smokeless tobacco.  He reports that he does not drink alcohol and does not use drugs. ? ?Review of Systems  ?All other systems reviewed and are negative. ? ?Physical Exam:  ?Vital signs in last 24 hours: ?Temp:  [98 ?F (36.7 ?C)-98.1 ?F (36.7 ?C)] 98.1 ?F (36.7 ?C) (04/22 0224) ?Pulse Rate:  [71-87] 71 (04/22 0224) ?Resp:  [18] 18 (04/22 0224) ?BP: (118-141)/(75-81) 118/75 (04/22 0224) ?SpO2:  [98 %-99 %] 99 % (04/22 0224) ?Weight:  [65.5 kg] 65.5 kg (04/21 1801) ?Physical Exam ?Vitals reviewed.  ?Constitutional:   ?   Appearance: Normal appearance.  ?HENT:  ?   Head: Normocephalic and atraumatic.  ?   Mouth/Throat:  ?   Mouth: Mucous membranes are dry.  ?Eyes:  ?   Extraocular Movements: Extraocular movements intact.  ?   Pupils: Pupils are equal, round, and reactive to light.  ?Cardiovascular:  ?   Rate and Rhythm: Normal rate and regular rhythm.  ?Pulmonary:  ?   Effort: Pulmonary effort is normal. No respiratory distress.  ?Abdominal:  ?   General: Abdomen is flat. There is no distension.  ?Musculoskeletal:     ?   General: No swelling. Normal range of motion.  ?   Cervical back: Normal range of motion and neck supple.  ?Skin: ?   General: Skin is warm and dry.  ?Neurological:  ?   General: No focal deficit present.  ?   Mental Status: He is alert and oriented to person,  place, and time.  ?Psychiatric:     ?   Mood and Affect: Mood normal.     ?   Behavior: Behavior normal.     ?   Thought Content: Thought content normal.     ?   Judgment: Judgment normal.  ? ? ?Laboratory Data:  ?Results for orders placed or performed during the hospital encounter of 01/14/22 (from the past 72 hour(s))  ?Urinalysis, Routine w reflex microscopic Urine, Clean Catch     Status: Abnormal  ? Collection Time: 01/14/22  8:45 PM  ?Result Value Ref Range  ? Color, Urine STRAW (A) YELLOW  ? APPearance HAZY (A) CLEAR  ? Specific Gravity, Urine 1.006 1.005 - 1.030  ? pH 5.0 5.0 - 8.0  ? Glucose, UA 50 (A) NEGATIVE mg/dL  ? Hgb urine dipstick MODERATE (A)  NEGATIVE  ? Bilirubin Urine NEGATIVE NEGATIVE  ? Ketones, ur NEGATIVE NEGATIVE mg/dL  ? Protein, ur NEGATIVE NEGATIVE mg/dL  ? Nitrite NEGATIVE NEGATIVE  ? Leukocytes,Ua NEGATIVE NEGATIVE  ? RBC / HPF 21-50 0 - 5 RBC/hpf  ? WBC, UA 0-5 0 - 5 WBC/hpf  ? Bacteria, UA RARE (A) NONE SEEN  ? Squamous Epithelial / LPF 0-5 0 - 5  ? Mucus PRESENT   ?  Comment: Performed at [facility], [place] [address], [place], [place] [address]  ?Glucose, capillary     Status: Abnormal  ? Collection Time: 01/14/22  8:56 PM  ?Result Value Ref Range  ? Glucose-Capillary 143 (H) 70 - 99 mg/dL  ?  Comment: Glucose reference range applies only to samples taken after fasting for at least 8 hours.  ?Resp Panel by RT-PCR (Flu A&B, Covid) Nasopharyngeal Swab     Status: None  ? Collection Time: 01/14/22  9:15 PM  ? Specimen: Nasopharyngeal Swab; Nasopharyngeal(NP) swabs in vial transport medium  ?Result Value Ref Range  ? SARS Coronavirus 2 by RT PCR NEGATIVE NEGATIVE  ?  Comment: (NOTE) ?SARS-CoV-2 target nucleic acids are NOT DETECTED. ? ?The SARS-CoV-2 RNA is generally detectable in upper respiratory ?specimens during the acute phase of infection. The lowest ?concentration of SARS-CoV-2 viral copies this assay can detect is ?138 copies/mL. A negative result does not preclude SARS-Cov-2 ?infection and should not be used as the sole basis for treatment or ?other patient management decisions. A negative result may occur with  ?improper specimen collection/handling, submission of specimen other ?than nasopharyngeal swab, presence of viral mutation(s) within the ?areas targeted by this assay, and inadequate number of viral ?copies(<138 copies/mL). A negative result must be combined with ?clinical observations, patient history, and epidemiological ?information. The expected result is Negative. ? ?Fact Sheet for Patients:  ?EntrepreneurPulse.com.au ? ?Fact Sheet for Healthcare Providers:   ?IncredibleEmployment.be ? ?This test is no t yet approved or cleared by the [place] FDA and  ?has been authorized for detection and/or diagnosis of SARS-CoV-2 by ?FDA under an Emergency Use Authorization (EUA). This EUA will remain  ?in effect (meaning this test can be used) for the duration of the ?COVID-19 declaration under Section 564(b)(1) of the Act, 21 ?U.S.C.section 360bbb-3(b)(1), unless the authorization is terminated  ?or revoked sooner.  ? ? ?  ? Influenza A by PCR NEGATIVE NEGATIVE  ? Influenza B by PCR NEGATIVE NEGATIVE  ?  Comment: (NOTE) ?The Xpert Xpress SARS-CoV-2/FLU/RSV plus assay is intended as an aid ?in the diagnosis of influenza from Nasopharyngeal swab specimens and ?should not be used as a sole basis for treatment.  Nasal washings and ?aspirates are unacceptable for Xpert Xpress SARS-CoV-2/FLU/RSV ?testing. ? ?Fact Sheet for Patients: ?EntrepreneurPulse.com.au ? ?Fact Sheet for Healthcare Providers: ?IncredibleEmployment.be ? ?This test is not yet approved or cleared by the [place] FDA and ?has been authorized for detection and/or diagnosis of SARS-CoV-2 by ?FDA under an Emergency Use Authorization (EUA). This EUA will remain ?in effect (meaning this test can be used) for the duration of the ?COVID-19 declaration under Section 564(b)(1) of the Act, 21 U.S.C. ?section 360bbb-3(b)(1), unless the authorization is terminated or ?revoked. ? ?Performed at [facility], [address] ?[place], [place] [address] ?  ?Basic metabolic panel     Status: Abnormal  ? Collection Time: 01/15/22  5:43 AM  ?Result Value Ref Range  ? Sodium 140 135 - 145 mmol/L  ? Potassium 3.7 3.5 - 5.1 mmol/L  ? Chloride 112 (H) 98 - 111 mmol/L  ? CO2 21 (L) 22 - 32 mmol/L  ? Glucose, Bld 91 70 - 99 mg/dL  ?  Comment: Glucose reference range applies only to samples taken after fasting for at least 8 hours.  ? BUN 35 (H) 8 - 23 mg/dL  ?  Creatinine, Ser 2.43 (H) 0.61 - 1.24 mg/dL  ? Calcium 9.1 8.9 - 10.3 mg/dL  ? GFR, Estimated 29 (L) >60 mL/min  ?  Comment: (NOTE) ?Calculated using the CKD-EPI Creatinine Equation (2021) ?  ? Anion gap 7 5 - 15  ?  Comment: Performed a

## 2022-01-15 NOTE — Progress Notes (Addendum)
Patient has passed a kidney stone measuring about 2-3  mm in width without expressing any pain.  ?

## 2022-01-15 NOTE — Progress Notes (Addendum)
PHARMACY NOTE -  ANTIBIOTIC RENAL DOSE ADJUSTMENT ? Patient has been initiated on Levaquin for PNA. ?SCr 2.43, estimated CrCl 27 ml/min ? ?Plan: Levaquin 500 mg IV q48 adjusted to Levaquin 750 mg PO q48, next dose 4/23 at 2200 ? ?Eudelia Bunch, Pharm.D ?01/15/2022 11:22 AM ? ?

## 2022-01-15 NOTE — Assessment & Plan Note (Addendum)
A1c 7.6 in 2017 ?Stop metformin until renal function normalizes ?Follow A1c 6.2 ? ?

## 2022-01-15 NOTE — Hospital Course (Addendum)
Brandon Peters is Brandon Peters 66 y.o. male with medical history significant of prostate cancer, right renal cell carcinoma, hypertension, type 2 diabetes and hyperlipidemia who presents as Brandon Peters direct admission from oncology with CT imaging showing new left hydroureteronephrosis with 59m left UVJ stone and new right middle lobe collapse/consolidation changes.  ? ?He presented with AKI.  His renal function is improving.  He passed stone on hospital day 1.  Urology was consulted and is planning for follow up outpatient with repeat renal [place]ain 3-4 weeks.  Discharging on abx for pneumonia. ? ?See below for additional details ?

## 2022-01-15 NOTE — Progress Notes (Signed)
?PROGRESS NOTE ? ? ? ?Brandon Peters  OIT:[id number] DOB: 1956/08/11 DOA: 01/14/2022 ?PCP: Brandon Akin, NP  ?No chief complaint on file. ? ? ?Brief Narrative:  ?Brandon Peters is Brandon Peters 66 y.o. male with medical history significant of prostate cancer, right renal cell carcinoma, hypertension, type 2 diabetes and hyperlipidemia who presents as Brandon Peters direct admission from oncology with CT imaging showing new left hydroureteronephrosis with 25m left UVJ stone and new right middle lobe collapse/consolidation changes.   ? ? ?Assessment & Plan: ?  ?Principal Problem: ?  AKI (acute kidney injury) ([facility] ?Active Problems: ?  CAP (community acquired pneumonia) ?  Prostatic cancer ([facility] ?  Renal cell carcinoma ([facility] ?  Controlled type 2 diabetes mellitus without complication, without long-term current use of insulin ([place] ?  Essential hypertension ?  HYPERLIPIDEMIA, MIXED, WITH LOW HDL ?  Hydroureteronephrosis ?  Stage 3a chronic kidney disease (CKD) ([place] ? ? ?Assessment and Plan: ?* AKI (acute kidney injury) ([facility] ?Secondary to left hydroureteronephrosis from 2 mm left UVJ stone ?Creatinine elevated to 3.13 ?Baseline creatinine appears ~1.6-1.7ish ?Improved today to 2.43 ?Continue IVF ?Urology c/s, appreciate recs -> planning for observation with f/u with Brandon Peters 3-4 weeks with renal [place]?continue Flomax  ?Stone passed ?CT 4/22 showed mild L hydroureteronephrosis 2/2 Brandon Peters 2 mm stone at the L UVJ.  L nephrolithiasis.  Mild enlargement of lower pole R renal mass.  New inferior right middle lobe collapse/consolidation.  Edema/fluid along the L iliopsoas muscle likely related to urinary tract obstruction.  Diffuse sclerotic prostate metastasis. ? ?CAP (community acquired pneumonia) ?CT with new inferior right middle lobe collapse/consolidation ?CXR with streaky peribronchial opacities in bilateral lung bases (atelectasis vs atypical pneumonia) ?Will complete course of abx ? ?Renal cell carcinoma ([place] ?Hold axitinib and pembrolizumab per  oncology ?[facility]  ?Had MRI of the abdomen 4/21 grossly similar size of right lower pole renal mass with renal vein thrombus  ?(need for anticoagulation? Will defer to Brandon Peters ? ? ? ?Prostatic cancer ([place] ?hold abiraterone and prednisone per oncology ?Follows with Brandon Peters?CXR with abnormal appearance of osseous structures with areas of sclerotic and lytic changes throughout the thorax, concerning for osseous metastatic disease (per Dr. SLearta Coddingnote from 4/21, extensive sclortic bone metastasis noted on CT abd/pelvis from 02/2016 - ct chest 05/25/2018 with diffuse lytic sclerotic lesions involving the axial and appendicular skeleton)  Will defer additional imaging to Brandon Peters  ? ?Essential hypertension ?Continue amlodipine ? ?Controlled type 2 diabetes mellitus without complication, without long-term current use of insulin ([place] ?A1c 7.6 in 2017 ?Takes metformin outpatient ?Follow A1c, SSI ? ? ?HYPERLIPIDEMIA, MIXED, WITH LOW HDL ?Continue statin ? ? ?DVT prophylaxis: heparin ?Code Status: full ?Family Communication: none ?Disposition:  ? ?Status is: Inpatient ?Remains inpatient appropriate because: need for IV abx, monitor renal function ?  ?Consultants:  ?urology ? ?Procedures:  ?none ? ?Antimicrobials:  ?Anti-infectives (From admission, onward)  ? ? Start     Dose/Rate Route Frequency Ordered Stop  ? 01/16/22 2200  levofloxacin (LEVAQUIN) tablet 750 mg  Status:  Discontinued       ? 750 mg Oral Every 48 hours 01/15/22 1121 01/15/22 1402  ? 01/14/22 2000  levofloxacin (LEVAQUIN) IVPB 500 mg  Status:  Discontinued       ? 500 mg ?100 mL/hr over 60 Minutes Intravenous Every 48 hours 01/14/22 1933 01/15/22 1121  ? ?  ? ? ?Subjective: ?No new complaints ? ?Objective: ?Vitals:  ? 01/14/22 1801  01/14/22 2136 01/15/22 0224  ?BP: 129/75 (!) 141/81 118/75  ?Pulse: 87 84 71  ?Resp: '18 18 18  '$ ?Temp: 98 ?F (36.7 ?C) 98.1 ?F (36.7 ?C) 98.1 ?F (36.7 ?C)  ?TempSrc: Oral Oral Oral  ?SpO2: 99% 98%  99%  ?Weight: 65.5 kg    ?Height: '5\' 5"'$  (1.651 m)    ? ? ?Intake/Output Summary (Last 24 hours) at 01/15/2022 1419 ?Last data filed at 01/15/2022 1012 ?Gross per 24 hour  ?Intake 752.33 ml  ?Output 600 ml  ?Net 152.33 ml  ? ?Filed Weights  ? 01/14/22 1801  ?Weight: 65.5 kg  ? ? ?Examination: ? ?General exam: Appears calm and comfortable  ?Respiratory system: unlabored ?Cardiovascular system: RRR ?Gastrointestinal system: Abdomen is nondistended, soft and nontender.  ?Central nervous system: Alert and oriented. No focal neurological deficits. ?Extremities: no LEE ? ? ?Data Reviewed: I have personally reviewed following labs and imaging studies ? ?CBC: ?Recent Labs  ?Lab 01/14/22 ?0949  ?WBC 6.1  ?NEUTROABS 4.8  ?HGB 8.5*  ?HCT 28.8*  ?MCV 98.6  ?PLT 204  ? ? ?Basic Metabolic Panel: ?Recent Labs  ?Lab 01/14/22 ?8185 01/15/22 ?0543  ?NA 139 140  ?K 4.4 3.7  ?CL 108 112*  ?CO2 20* 21*  ?GLUCOSE 147* 91  ?BUN 39* 35*  ?CREATININE 3.13* 2.43*  ?CALCIUM 9.6 9.1  ? ? ?GFR: ?Estimated Creatinine Clearance: 26.7 mL/min (Brandon Peters) (by C-G formula based on SCr of 2.43 mg/dL (H)). ? ?Liver Function Tests: ?Recent Labs  ?Lab 01/14/22 ?0949  ?AST 12*  ?ALT 8  ?ALKPHOS 40  ?BILITOT 0.6  ?PROT 6.7  ?ALBUMIN 4.0  ? ? ?CBG: ?Recent Labs  ?Lab 01/14/22 ?2056  ?GLUCAP 143*  ? ? ? ?Recent Results (from the past 240 hour(s))  ?Resp Panel by RT-PCR (Flu Brandon Peters&B, Covid) Nasopharyngeal Swab     Status: None  ? Collection Time: 01/14/22  9:15 PM  ? Specimen: Nasopharyngeal Swab; Nasopharyngeal(NP) swabs in vial transport medium  ?Result Value Ref Range Status  ? SARS Coronavirus 2 by RT PCR NEGATIVE NEGATIVE Final  ?  Comment: (NOTE) ?SARS-CoV-2 target nucleic acids are NOT DETECTED. ? ?The SARS-CoV-2 RNA is generally detectable in upper respiratory ?specimens during the acute phase of infection. The lowest ?concentration of SARS-CoV-2 viral copies this assay can detect is ?138 copies/mL. Brandon Peters negative result does not preclude SARS-Cov-2 ?infection and  should not be used as the sole basis for treatment or ?other patient management decisions. Maysen Bonsignore negative result may occur with  ?improper specimen collection/handling, submission of specimen other ?than nasopharyngeal swab, presence of viral mutation(s) within the ?areas targeted by this assay, and inadequate number of viral ?copies(<138 copies/mL). Faige Seely negative result must be combined with ?clinical observations, patient history, and epidemiological ?information. The expected result is Negative. ? ?Fact Sheet for Patients:  ?EntrepreneurPulse.com.au ? ?Fact Sheet for Healthcare Providers:  ?IncredibleEmployment.be ? ?This test is no t yet approved or cleared by the [place] FDA and  ?has been authorized for detection and/or diagnosis of SARS-CoV-2 by ?FDA under an Emergency Use Authorization (EUA). This EUA will remain  ?in effect (meaning this test can be used) for the duration of the ?COVID-19 declaration under Section 564(b)(1) of the Act, 21 ?U.S.C.section 360bbb-3(b)(1), unless the authorization is terminated  ?or revoked sooner.  ? ? ?  ? Influenza Eddison Searls by PCR NEGATIVE NEGATIVE Final  ? Influenza B by PCR NEGATIVE NEGATIVE Final  ?  Comment: (NOTE) ?The Xpert Xpress SARS-CoV-2/FLU/RSV plus assay is intended as an aid ?  in the diagnosis of influenza from Nasopharyngeal swab specimens and ?should not be used as Shrey Boike sole basis for treatment. Nasal washings and ?aspirates are unacceptable for Xpert Xpress SARS-CoV-2/FLU/RSV ?testing. ? ?Fact Sheet for Patients: ?EntrepreneurPulse.com.au ? ?Fact Sheet for Healthcare Providers: ?IncredibleEmployment.be ? ?This test is not yet approved or cleared by the [place] FDA and ?has been authorized for detection and/or diagnosis of SARS-CoV-2 by ?FDA under an Emergency Use Authorization (EUA). This EUA will remain ?in effect (meaning this test can be used) for the duration of the ?COVID-19 declaration  under Section 564(b)(1) of the Act, 21 U.S.C. ?section 360bbb-3(b)(1), unless the authorization is terminated or ?revoked. ? ?Performed at [facility], [address] ?[place], [place]

## 2022-01-16 DIAGNOSIS — M7989 Other specified soft tissue disorders: Secondary | ICD-10-CM

## 2022-01-16 LAB — CBC WITH DIFFERENTIAL/PLATELET
Abs Immature Granulocytes: 0.02 10*3/uL (ref 0.00–0.07)
Basophils Absolute: 0 10*3/uL (ref 0.0–0.1)
Basophils Relative: 0 %
Eosinophils Absolute: 0.1 10*3/uL (ref 0.0–0.5)
Eosinophils Relative: 3 %
HCT: 28.8 % — ABNORMAL LOW (ref 39.0–52.0)
Hemoglobin: 8.5 g/dL — ABNORMAL LOW (ref 13.0–17.0)
Immature Granulocytes: 0 %
Lymphocytes Relative: 23 %
Lymphs Abs: 1.1 10*3/uL (ref 0.7–4.0)
MCH: 29.8 pg (ref 26.0–34.0)
MCHC: 29.5 g/dL — ABNORMAL LOW (ref 30.0–36.0)
MCV: 101.1 fL — ABNORMAL HIGH (ref 80.0–100.0)
Monocytes Absolute: 0.5 10*3/uL (ref 0.1–1.0)
Monocytes Relative: 11 %
Neutro Abs: 3 10*3/uL (ref 1.7–7.7)
Neutrophils Relative %: 63 %
Platelets: 210 10*3/uL (ref 150–400)
RBC: 2.85 MIL/uL — ABNORMAL LOW (ref 4.22–5.81)
RDW: 15.3 % (ref 11.5–15.5)
WBC: 4.8 10*3/uL (ref 4.0–10.5)
nRBC: 0 % (ref 0.0–0.2)

## 2022-01-16 LAB — COMPREHENSIVE METABOLIC PANEL
ALT: 12 U/L (ref 0–44)
AST: 15 U/L (ref 15–41)
Albumin: 3.6 g/dL (ref 3.5–5.0)
Alkaline Phosphatase: 45 U/L (ref 38–126)
Anion gap: 10 (ref 5–15)
BUN: 37 mg/dL — ABNORMAL HIGH (ref 8–23)
CO2: 22 mmol/L (ref 22–32)
Calcium: 9.6 mg/dL (ref 8.9–10.3)
Chloride: 111 mmol/L (ref 98–111)
Creatinine, Ser: 2.35 mg/dL — ABNORMAL HIGH (ref 0.61–1.24)
GFR, Estimated: 30 mL/min — ABNORMAL LOW (ref >60)
Glucose, Bld: 95 mg/dL (ref 70–99)
Potassium: 3.2 mmol/L — ABNORMAL LOW (ref 3.5–5.1)
Sodium: 143 mmol/L (ref 135–145)
Total Bilirubin: 0.5 mg/dL (ref 0.3–1.2)
Total Protein: 7 g/dL (ref 6.5–8.1)

## 2022-01-16 LAB — MAGNESIUM: Magnesium: 2.3 mg/dL (ref 1.7–2.4)

## 2022-01-16 LAB — PHOSPHORUS: Phosphorus: 4.3 mg/dL (ref 2.5–4.6)

## 2022-01-16 LAB — HEMOGLOBIN A1C
Hgb A1c MFr Bld: 6.2 % — ABNORMAL HIGH (ref 4.8–5.6)
Mean Plasma Glucose: 131.24 mg/dL

## 2022-01-16 MED ORDER — AZITHROMYCIN 250 MG PO TABS: 500.0000 mg | ORAL_TABLET | Freq: Every day | ORAL | 0 refills | Status: AC

## 2022-01-16 MED ORDER — CEFDINIR 300 MG PO CAPS
300.0000 mg | ORAL_CAPSULE | Freq: Two times a day (BID) | ORAL | Status: DC
Start: 2022-01-16 — End: 2022-01-16
  Administered 2022-01-16: 300 mg via ORAL
  Filled 2022-01-16: qty 1

## 2022-01-16 MED ORDER — POTASSIUM CHLORIDE CRYS ER 20 MEQ PO TBCR
40.0000 meq | EXTENDED_RELEASE_TABLET | Freq: Once | ORAL | Status: AC
  Administered 2022-01-16: 40 meq via ORAL

## 2022-01-16 MED ORDER — AZITHROMYCIN 250 MG PO TABS
500.0000 mg | ORAL_TABLET | Freq: Every day | ORAL | Status: DC
  Administered 2022-01-16: 500 mg via ORAL

## 2022-01-16 MED ORDER — CEFDINIR 300 MG PO CAPS: 300.0000 mg | ORAL_CAPSULE | Freq: Two times a day (BID) | ORAL | 0 refills | Status: AC

## 2022-01-16 NOTE — Assessment & Plan Note (Signed)
R arm swelling due to IV infiltration ?Elevated and follow outpatient ?

## 2022-01-16 NOTE — Discharge Summary (Signed)
Physician Discharge Summary  ?Brandon Peters PFX:[id number] DOB: 16-Nov-1955 DOA: 01/14/2022 ? ?PCP: Ashley Akin, NP ? ?Admit date: 01/14/2022 ?Discharge date: 01/16/2022 ? ?Time spent: 40 minutes ? ?Recommendations for Outpatient Follow-up:  ?Follow outpatient CBC/CMP  ?Follow renal function outpatient ?Follow with urology in 3-4 weeks for renal US ?Follow R arm swelling outpatient, w/u further if non resolving or worsening ?CXR with findings concerning for osseous metastatic disease (with known bone mets, will defer additional imaging to Dr. Benay Spice) ?Hold metformin until renal function stabilizes  ? ?Discharge Diagnoses:  ?Principal Problem: ?  AKI (acute kidney injury) ([place]) ?Active Problems: ?  CAP (community acquired pneumonia) ?  Prostatic cancer ([place]) ?  Renal cell carcinoma ([place]) ?  Controlled type 2 diabetes mellitus without complication, without long-term current use of insulin ([place]) ?  Essential hypertension ?  HYPERLIPIDEMIA, MIXED, WITH LOW HDL ?  Arm swelling ?  Hydroureteronephrosis ?  Stage 3a chronic kidney disease (CKD) ([facility]) ? ? ?Discharge Condition: stable ? ?Diet recommendation: heart healthy, diabetic ? ?Filed Weights  ? 01/14/22 1801  ?Weight: 65.5 kg  ? ? ?History of present illness:  ?Brandon Peters is Brandon Peters 66 y.o. male with medical history significant of prostate cancer, right renal cell carcinoma, hypertension, type 2 diabetes and hyperlipidemia who presents as Brandon Peters direct admission from oncology with CT imaging showing new left hydroureteronephrosis with 60m left UVJ stone and new right middle lobe collapse/consolidation changes.  ? ?He presented with AKI.  His renal function is improving.  He passed stone on hospital day 1.  Urology was consulted and is planning for follow up outpatient with repeat renal UKoreain 3-4 weeks.  Discharging on abx for pneumonia. ? ?See below for additional details ? ?Hospital Course:  ?Assessment and Plan: ?* AKI (acute kidney injury) ([place] ?Secondary to left  hydroureteronephrosis from 2 mm left UVJ stone ?Creatinine elevated to 3.13 ?Baseline creatinine appears ~1.6-1.7ish ?Improved today to 2.35 (continue to monitor) ?Continue IVF ?Urology c/s, appreciate recs -> planning for observation with f/u with Dr. BGloriann Loanin 3-4 weeks with renal UKorea?continue Flomax  ?Stone passed ?CT 4/22 showed mild L hydroureteronephrosis 2/2 Brandon Peters 2 mm stone at the L UVJ.  L nephrolithiasis.  Mild enlargement of lower pole R renal mass.  New inferior right middle lobe collapse/consolidation.  Edema/fluid along the L iliopsoas muscle likely related to urinary tract obstruction.  Diffuse sclerotic prostate metastasis. ? ?CAP (community acquired pneumonia) ?CT with new inferior right middle lobe collapse/consolidation ?CXR with streaky peribronchial opacities in bilateral lung bases (atelectasis vs atypical pneumonia) ?Will complete course of abx, follow with Dr. SBenay Spiceoutpatient ? ?Renal cell carcinoma ([place] ?Hold axitinib and pembrolizumab per oncology ?[facility]  ?Had MRI of the abdomen 4/21 grossly similar size of right lower pole renal mass with renal vein thrombus.  Suspect this is tumor thrombus based on prior documentation.  Follow with oncology outpatient. ? ? ? ?Prostatic cancer ([place] ?hold abiraterone and prednisone per oncology ?Follows with Dr. SBenay Spice?CXR with abnormal appearance of osseous structures with areas of sclerotic and lytic changes throughout the thorax, concerning for osseous metastatic disease -> suspect this is related to his known bone mets (per Dr. SLearta Coddingnote from 4/21, extensive sclortic bone metastasis noted on CT abd/pelvis from 02/2016 - ct chest 05/25/2018 with diffuse lytic sclerotic lesions involving the axial and appendicular skeleton)  Will defer additional imaging to Dr. SBenay Spice  ? ?Essential hypertension ?Continue amlodipine ? ?Controlled type 2 diabetes mellitus without complication, without  long-term current use of insulin ([place]) ?A1c  7.6 in 2017 ?Stop metformin until renal function normalizes ?Follow A1c 6.2 ? ? ?HYPERLIPIDEMIA, MIXED, WITH LOW HDL ?Continue statin ? ?Arm swelling ?R arm swelling due to IV infiltration ?Elevated and follow outpatient ? ? ?Procedures: ?none  ? ?Consultations: ?none ? ?Discharge Exam: ?Vitals:  ? 01/16/22 0439 01/16/22 1418  ?BP: 120/75 (!) 141/79  ?Pulse: 69 80  ?Resp: 18 16  ?Temp: 98.1 ?F (36.7 ?C) 97.8 ?F (36.6 ?C)  ?SpO2: 98% 99%  ? ?Eager to go home ?Swelling in R arm, no pain ? ?General: No acute distress. ?Cardiovascular: RRR ?Lungs: unlabored ?Abdomen: Soft, nontender, nondistended  ?Neurological: Alert and oriented ?3. Moves all extremities ?4. Cranial nerves II through XII grossly intact. ?Extremities: R arm swelling, nontender ? ?Discharge Instructions ? ? ?Discharge Instructions   ? ? Call MD for:  difficulty breathing, headache or visual disturbances   Complete by: As directed ?  ? Call MD for:  extreme fatigue   Complete by: As directed ?  ? Call MD for:  hives   Complete by: As directed ?  ? Call MD for:  persistant dizziness or light-headedness   Complete by: As directed ?  ? Call MD for:  persistant nausea and vomiting   Complete by: As directed ?  ? Call MD for:  redness, tenderness, or signs of infection (pain, swelling, redness, odor or green/yellow discharge around incision site)   Complete by: As directed ?  ? Call MD for:  severe uncontrolled pain   Complete by: As directed ?  ? Call MD for:  temperature >100.4   Complete by: As directed ?  ? Diet - low sodium heart healthy   Complete by: As directed ?  ? Discharge instructions   Complete by: As directed ?  ? You were seen for acute kidney injury and Brandon Peters kidney stone. ? ?Your kidney function is gradually improving after you passed your stone. ? ?You'll need to follow up your kidney function with Dr. Benay Spice.  Stop your metformin until your kidney function returns to normal. ? ?You should follow up with Dr. Gloriann Loan in 3-4 weeks for Keina Mutch renal  ultrasound. ? ?You also had pneumonia.  We're treating this with antibiotics.  You were never very symptomatic from this.  Please follow up with Dr. Benay Spice regarding your pneumonia as well. ? ?You had evidence of metastatic disease on your chest x ray to your bones.  I suspect this is from the know bone metastasis from your prostate cancer.  Follow this up with Dr. Benay Spice. ? ?Watch the swelling in your right arm.  Keep it elevated, it should gradually improve with time.  If it doesn't improve or you develop worsening swelling or pain you follow up with Dr. Benay Spice regarding imaging like an ultrasound. ? ?Return for new, recurrent or worsening symptoms. ? ?Please ask your PCP to request records from this hospitalization so they know what was done and what the next steps will be.  ? Increase activity slowly   Complete by: As directed ?  ? ?  ? ?Allergies as of 01/16/2022   ? ?   Reactions  ? Penicillins Other (See Comments)  ? Pt states that he cannot really remember reaction. Tentatively reports swelling/hives  ? ?  ? ?  ?Medication List  ?  ? ?STOP taking these medications   ? ?metFORMIN 500 MG tablet ?Commonly known as: GLUCOPHAGE ?  ? ?  ? ?TAKE these medications   ? ?  abiraterone acetate 250 MG tablet ?Commonly known as: Zytiga ?TAKE 4 TABLETS BY MOUTH ONCE DAILY AS DIRECTED.  TAKE 1 HOUR BEFORE OR 2 HOURS AFTER Brandon Peters MEAL ?What changed:  ?how much to take ?when to take this ?additional instructions ?  ?amLODipine 10 MG tablet ?Commonly known as: NORVASC ?Take 10 mg by mouth daily. ?  ?azithromycin 250 MG tablet ?Commonly known as: ZITHROMAX ?Take 2 tablets (500 mg total) by mouth daily for 4 days. ?Start taking on: January 17, 2022 ?  ?cefdinir 300 MG capsule ?Commonly known as: OMNICEF ?Take 1 capsule (300 mg total) by mouth every 12 (twelve) hours for 5 days. ?  ?docusate sodium 100 MG capsule ?Commonly known as: COLACE ?Take 100 mg by mouth daily as needed for mild constipation. ?  ?ferrous sulfate 325 (65 FE)  MG EC tablet ?Take 1 tablet (325 mg total) by mouth 3 (three) times daily with meals. ?  ?Inlyta 1 MG tablet ?Generic drug: axitinib ?Take 2 tablets (2 mg total) by mouth 2 (two) times daily. ?  ?loperamide 2 MG capsule

## 2022-01-18 ENCOUNTER — Telehealth: Payer: Self-pay | Admitting: *Deleted

## 2022-01-18 NOTE — Telephone Encounter (Signed)
Brandon Peters was contacted by telephone to verify understanding of discharge instructions status post their most recent discharge from the hospital on the date:  01/16/22.  Inpatient discharge AVS was re-reviewed with patient, along with cancer center appointments.  Verification of understanding for oncology specific follow-up was validated using the Teach Back method.   ?Had been constipated and had good loose BM today. Reports his right arm swelling has diminished, but still a bit sore. Saw his PCP today and goes to [facility]. ?Transportation to appointments were confirmed for the patient as being self/caregiver. ? ?Caesar Sedivy?s questions were addressed to their satisfaction upon completion of this post discharge follow-up call for outpatient oncology.  ?

## 2022-01-21 ENCOUNTER — Inpatient Hospital Stay: Payer: Medicare Other | Admitting: Nurse Practitioner

## 2022-01-21 ENCOUNTER — Inpatient Hospital Stay: Payer: Medicare Other

## 2022-01-21 ENCOUNTER — Encounter: Payer: Self-pay | Admitting: Nurse Practitioner

## 2022-01-21 VITALS — BP 141/79 | HR 88 | Temp 97.8°F | Resp 18 | Ht 65.0 in | Wt 144.0 lb

## 2022-01-21 DIAGNOSIS — C641 Malignant neoplasm of right kidney, except renal pelvis: Secondary | ICD-10-CM | POA: Insufficient documentation

## 2022-01-21 DIAGNOSIS — C61 Malignant neoplasm of prostate: Secondary | ICD-10-CM | POA: Diagnosis not present

## 2022-01-21 DIAGNOSIS — Z79899 Other long term (current) drug therapy: Secondary | ICD-10-CM | POA: Diagnosis not present

## 2022-01-21 DIAGNOSIS — R197 Diarrhea, unspecified: Secondary | ICD-10-CM | POA: Diagnosis not present

## 2022-01-21 DIAGNOSIS — C7951 Secondary malignant neoplasm of bone: Secondary | ICD-10-CM | POA: Insufficient documentation

## 2022-01-21 LAB — CMP (CANCER CENTER ONLY)
ALT: 17 U/L (ref 0–44)
AST: 15 U/L (ref 15–41)
Albumin: 3.9 g/dL (ref 3.5–5.0)
Alkaline Phosphatase: 40 U/L (ref 38–126)
Anion gap: 10 (ref 5–15)
BUN: 22 mg/dL (ref 8–23)
CO2: 23 mmol/L (ref 22–32)
Calcium: 9.8 mg/dL (ref 8.9–10.3)
Chloride: 110 mmol/L (ref 98–111)
Creatinine: 1.61 mg/dL — ABNORMAL HIGH (ref 0.61–1.24)
GFR, Estimated: 47 mL/min — ABNORMAL LOW (ref >60)
Glucose, Bld: 135 mg/dL — ABNORMAL HIGH (ref 70–99)
Potassium: 3 mmol/L — ABNORMAL LOW (ref 3.5–5.1)
Sodium: 143 mmol/L (ref 135–145)
Total Bilirubin: 0.4 mg/dL (ref 0.3–1.2)
Total Protein: 6.8 g/dL (ref 6.5–8.1)

## 2022-01-21 LAB — CBC WITH DIFFERENTIAL (CANCER CENTER ONLY)
Abs Immature Granulocytes: 0.03 10*3/uL (ref 0.00–0.07)
Basophils Absolute: 0 10*3/uL (ref 0.0–0.1)
Basophils Relative: 0 %
Eosinophils Absolute: 0.1 10*3/uL (ref 0.0–0.5)
Eosinophils Relative: 3 %
HCT: 27.5 % — ABNORMAL LOW (ref 39.0–52.0)
Hemoglobin: 8.1 g/dL — ABNORMAL LOW (ref 13.0–17.0)
Immature Granulocytes: 1 %
Lymphocytes Relative: 17 %
Lymphs Abs: 0.9 10*3/uL (ref 0.7–4.0)
MCH: 28.9 pg (ref 26.0–34.0)
MCHC: 29.5 g/dL — ABNORMAL LOW (ref 30.0–36.0)
MCV: 98.2 fL (ref 80.0–100.0)
Monocytes Absolute: 0.5 10*3/uL (ref 0.1–1.0)
Monocytes Relative: 10 %
Neutro Abs: 3.6 10*3/uL (ref 1.7–7.7)
Neutrophils Relative %: 69 %
Platelet Count: 214 10*3/uL (ref 150–400)
RBC: 2.8 MIL/uL — ABNORMAL LOW (ref 4.22–5.81)
RDW: 14.6 % (ref 11.5–15.5)
WBC Count: 5.2 10*3/uL (ref 4.0–10.5)
nRBC: 0 % (ref 0.0–0.2)

## 2022-01-21 LAB — TOTAL PROTEIN, URINE DIPSTICK: Protein, ur: 30 mg/dL — AB

## 2022-01-21 NOTE — Progress Notes (Signed)
?[place] ?OFFICE PROGRESS NOTE ? ? ?Diagnosis: Renal cell carcinoma, prostate cancer ? ?INTERVAL HISTORY:  ? ?Brandon Peters was hospitalized 01/14/2022 through 01/16/2022 with new left hydroureteronephrosis and new right middle lobe collapse/consolidation changes.  He passed a stone on Peters day 1.  Urology will be following up with him as an outpatient.  He was discharged home on antibiotics. ? ?No shortness of breath or cough.  He completed the antibiotic course this morning.  He has been having significant diarrhea since Monday of this week.  He estimates a loose stool every 20 minutes during the night.  No abdominal pain.  No nausea or vomiting.  No bloody bowel movements.  No rash. ? ?Objective: ? ?Vital signs in last 24 hours: ? ?Blood pressure (!) 141/79, pulse 88, temperature 97.8 ?F (36.6 ?C), temperature source Oral, resp. rate 18, height '5\' 5"'  (1.651 m), weight 144 lb (65.3 kg), SpO2 98 %. ?  ? ?HEENT: No thrush or ulcers. ?Resp: Lungs clear bilaterally. ?Cardio: Regular rate and rhythm. ?GI: Abdomen soft and nontender.  No hepatosplenomegaly.  No mass. ?Vascular: No leg edema. ?Skin: Palms and soles dry appearing.  Skin changes right upper arm consistent with tape reaction. ? ? ?Lab Results: ? ?Lab Results  ?Component Value Date  ? WBC 5.2 01/21/2022  ? HGB 8.1 (L) 01/21/2022  ? HCT 27.5 (L) 01/21/2022  ? MCV 98.2 01/21/2022  ? PLT 214 01/21/2022  ? NEUTROABS 3.6 01/21/2022  ? ? ?Imaging: ? ?No results found. ? ?Medications: I have reviewed the patient's current medications. ? ?Assessment/Plan: ?History of Severe anemia-status post a red cell transfusion 03/04/2016 ?2.. Prostate cancer ?Extensive sclerotic bone metastases noted on a CT of the abdomen/pelvis 03/04/2016 and MRI abdomen 03/05/2016 ?Markedly elevated PSA ?Lupron initiated 03/15/2016; Casodex ?14 days beginning 03/15/2016 ?Prostate biopsy 03/18/2016 ?05/09/2016 PSA significantly improved at 21 ?Abiraterone initiated  05/10/2016 ?PSA less than 0.1 07/10/2018 ?PSA less than 0.1 08/30/2018 ?PSA less than 0.1 10/18/2018 ?PSA less than 0.1 11/29/2018 ?PSA less than 0.1 on 11/06/2019  ?PSA less than 0.1 on 03/05/2020 ?PSA less than 0.1 on 07/06/2020 ?PSA less than 0.1 08/17/2021 ?  ?3.   Symptoms of obstructive uropathy. Improved ?  ?4.   Distal duodenal and descending colon masses ?biopsy of the duodenal mass 03/04/2016 confirmed fragments of small bowel mucosa with focal high-grade dysplasia ?Biopsy of the descending colon "polyps" revealed fragments of a tubulovillous adenoma ?CT 08/29/2017-similar soft tissue thickening in the region of the splenic flexure of the colon suboptimally evaluated due to lack of colonic enteric contrast ?Referred to Brandon Peters ?Referred to Brandon Peters ?Resection of descending colon polyps 01/31/2018, tubulovillous adenomas with high-grade dysplasia ?Attempted endoscopic resection of the fourth segment duodenal "polyp" on 05/09/2018, firm unresectable lesion, biopsy revealed moderately differentiated adenocarcinoma ?CT abdomen/pelvis 05/25/2018- heterogeneously enhancing right lower pole renal lesion; diffuse sclerotic osseous metastatic lesions compatible with history of prostate cancer ?CT chest 05/25/2018- no pulmonary metastasis.  Diffuse osseous metastasis involving the axial and appendicular skeleton. ?06/19/2018-sleeve resection of third and fourth portions of duodenum with primary end-to-end anastomosis, feeding gastrojejunostomy tube placement, Brandon Peters; poorly differentiated adenocarcinoma duodenum, 3 cm; tumor invades the muscularis propria; lymphovascular invasion present; negative margins; 4 of 11 involved lymph nodes; soft tissue deposits also seen in the mesentery; all mucosal margins and soft tissue margins negative but 2 of the positive nodes are found in the soft tissue margin submitted for frozen section (pT2 pN2); second mucosal lesion 1.2 cm from the proximal margin,  tubular adenoma with focal severe  dysplasia ?Intact expression of mismatch repair proteins by IHC; microsatellite stable ?Cycle 1 adjuvant Xeloda 07/23/2018 ?Cycle 2 adjuvant Xeloda 08/13/2018 ?Cycle 3 adjuvant Xeloda 09/10/2018 (dose reduced to 1000 mg twice daily for 14 days)  ?Cycle 4 adjuvant Xeloda 10/01/2018 (dose increased to 1500 mg every morning and 1000 mg every afternoon for 14 days) ?Cycle 5 adjuvant Xeloda 10/22/2018 ?Cycle 6 adjuvant Xeloda 11/12/2018 ?Cycle 7 adjuvant Xeloda 12/03/2018 ?Cycle 8 adjuvant Xeloda 12/24/2018 ?  ?5.  Anorexia/weight loss- resolved ?  ?6.  Undescended testicles ?  ?7.  Right renal mass.  CT 08/29/2017-right renal mass involving the lower pole was partially calcified and does demonstrate enhancement following contrast most consistent with renal cell carcinoma.  Lesion not significantly enlarged compared with the prior CT. Followed by urology. ?Renal ultrasound 07/06/2021-enlargement of right renal mass ?CT abdomen 09/28/2020-large minute heterogenously enhancing mass in the inferior pole the right kidney with new fullness of the inferior branch of the right renal vein, no enlarged abdominal lymph nodes, unchanged sclerotic osseous metastatic disease ?MRI abdomen 08/12/2021-heterogenously enhancing mass in the lower pole right kidney extending into the central sinus fat and posteriorly has mass-effect on the right psoas muscle without definite invasion, tumor thrombus in the right renal vein extending to the IVC/RA junction, upper normal size retrocaval node ?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Axitinib resumed at a reduced dose of 2 mg twice daily after office visit 12/14/2021 ?Pembrolizumab  12/24/2021 ?Axitinib stopped 01/03/2022 secondary to persistent hand/foot symptoms ?CT chest at [facility] 01/13/2022-no evidence of thoracic metastatic disease, similar diffuse sclerotic change in the skeleton ?[facility] abdomen [facility]-similar size of right lower pole renal mass with renal vein thrombosis, decreased overall extent of thrombus now extending to the intrahepatic IVC, enhancement of right renal mass and thrombus is decreased, new left hydroureter ureter nephrosis with asymmetric perinephric stranding ?  ?8.  Diabetes ?  ?9.  Anemia-stool Hemoccults + March 2021, referred to Brandon Peters for endoscopic evaluation, anemia also secondary to metastatic prostate cancer, hypogonadism ?Small bowel enteroscopy 02/11/2020-esophagus normal.  Hematin found in the gastric body.  Examined duodenum normal.  No evidence of significant pathology in the entire examined portion of the jejunum.  There was no overt source of bleeding or inflammation.  The bleeding may be from small AVMs.  Surgical anastomosis was not visible with repeated inspection of the duodenum. ?Colonoscopy 02/11/2020-multiple sessile and semipedunculated polyps were found in the rectum, descending colon and ascending colon, 3 to 18 mm in size.  Polyps removed (polypectomy descending colon-fragments of tubular adenoma, inflammatory type polyp; polypectomy ascending colon-tubular adenoma, no high-grade dysplasia or malignancy; polypectomy descending colon-fragments of tubulovillous adenoma, no high-grade dysplasia or malignancy; polypectomy colon, rectum-tubular adenoma, no high-grade dysplasia or malignancy). ?Colonoscopy 02/02/2021-seven 2 to 10 mm polyps in the sigmoid colon (tubular adenoma), descending colon (fragments of tubulovillous adenoma, fragments of tubular adenoma ) and transverse colon (fragments of tubular adenoma). ?Progressive anemia 07/05/2021; stool cards positive ?Upper endoscopy/colonoscopy 09/23/2021-multiple polyps removed from the  colon-tubular adenomas, diffuse angioectasias in the stomach with bleeding-not amenable to endoscopic treatment ?10.  Renal failure-progressive 01/17/2022 ?11.  CT chest at [facility] 01/13/2022-no evidence of thoracic metastatic disease, sim

## 2022-01-24 ENCOUNTER — Telehealth: Payer: Self-pay

## 2022-01-24 ENCOUNTER — Other Ambulatory Visit (HOSPITAL_COMMUNITY): Payer: Self-pay

## 2022-01-24 ENCOUNTER — Encounter: Payer: Self-pay | Admitting: Nurse Practitioner

## 2022-01-24 NOTE — Telephone Encounter (Signed)
TC from Pt inquiring if he should start back taking Inlynta. Asked Pt if he still has diarrhea and he stated he had 1 bowel movement today and 3 this morning but not like it was before. Discussed with Leander Rams who stated Pt should monitor his bowel movements today and if better to start back tomorrow 2 mg daily. Pt verbalized understanding. ?

## 2022-01-25 ENCOUNTER — Telehealth: Payer: Self-pay

## 2022-01-25 NOTE — Telephone Encounter (Signed)
Fredonia Highland called from [place] office and left a message stated the patient needs to continue treatment with Dr. Benay Spice, and if we have any question to give her a call at [phone]. I rely the message to Dr Benay Spice and Elby Showers. Thomas . ?

## 2022-01-27 ENCOUNTER — Inpatient Hospital Stay: Payer: Medicare Other

## 2022-01-27 ENCOUNTER — Other Ambulatory Visit: Payer: Self-pay | Admitting: *Deleted

## 2022-01-27 ENCOUNTER — Inpatient Hospital Stay: Payer: Medicare Other | Attending: Oncology

## 2022-01-27 VITALS — BP 143/75 | HR 66 | Temp 98.4°F | Resp 18 | Ht 65.0 in | Wt 142.4 lb

## 2022-01-27 DIAGNOSIS — C61 Malignant neoplasm of prostate: Secondary | ICD-10-CM | POA: Diagnosis present

## 2022-01-27 DIAGNOSIS — C641 Malignant neoplasm of right kidney, except renal pelvis: Secondary | ICD-10-CM

## 2022-01-27 DIAGNOSIS — N19 Unspecified kidney failure: Secondary | ICD-10-CM | POA: Insufficient documentation

## 2022-01-27 DIAGNOSIS — Z86010 Personal history of colonic polyps: Secondary | ICD-10-CM | POA: Insufficient documentation

## 2022-01-27 DIAGNOSIS — Z79899 Other long term (current) drug therapy: Secondary | ICD-10-CM | POA: Insufficient documentation

## 2022-01-27 DIAGNOSIS — Q5320 Undescended testicle, unspecified, bilateral: Secondary | ICD-10-CM | POA: Insufficient documentation

## 2022-01-27 DIAGNOSIS — C7951 Secondary malignant neoplasm of bone: Secondary | ICD-10-CM | POA: Diagnosis present

## 2022-01-27 DIAGNOSIS — Z5112 Encounter for antineoplastic immunotherapy: Secondary | ICD-10-CM | POA: Diagnosis present

## 2022-01-27 DIAGNOSIS — E291 Testicular hypofunction: Secondary | ICD-10-CM | POA: Insufficient documentation

## 2022-01-27 DIAGNOSIS — E119 Type 2 diabetes mellitus without complications: Secondary | ICD-10-CM | POA: Diagnosis not present

## 2022-01-27 LAB — CBC WITH DIFFERENTIAL (CANCER CENTER ONLY)
Abs Immature Granulocytes: 0.02 10*3/uL (ref 0.00–0.07)
Basophils Absolute: 0 10*3/uL (ref 0.0–0.1)
Basophils Relative: 0 %
Eosinophils Absolute: 0 10*3/uL (ref 0.0–0.5)
Eosinophils Relative: 1 %
HCT: 30.3 % — ABNORMAL LOW (ref 39.0–52.0)
Hemoglobin: 9.2 g/dL — ABNORMAL LOW (ref 13.0–17.0)
Immature Granulocytes: 0 %
Lymphocytes Relative: 11 %
Lymphs Abs: 0.7 10*3/uL (ref 0.7–4.0)
MCH: 29.4 pg (ref 26.0–34.0)
MCHC: 30.4 g/dL (ref 30.0–36.0)
MCV: 96.8 fL (ref 80.0–100.0)
Monocytes Absolute: 0.3 10*3/uL (ref 0.1–1.0)
Monocytes Relative: 6 %
Neutro Abs: 5.1 10*3/uL (ref 1.7–7.7)
Neutrophils Relative %: 82 %
Platelet Count: 240 10*3/uL (ref 150–400)
RBC: 3.13 MIL/uL — ABNORMAL LOW (ref 4.22–5.81)
RDW: 14.6 % (ref 11.5–15.5)
WBC Count: 6.1 10*3/uL (ref 4.0–10.5)
nRBC: 0 % (ref 0.0–0.2)

## 2022-01-27 LAB — CMP (CANCER CENTER ONLY)
ALT: 14 U/L (ref 0–44)
AST: 15 U/L (ref 15–41)
Albumin: 4.2 g/dL (ref 3.5–5.0)
Alkaline Phosphatase: 57 U/L (ref 38–126)
Anion gap: 12 (ref 5–15)
BUN: 29 mg/dL — ABNORMAL HIGH (ref 8–23)
CO2: 23 mmol/L (ref 22–32)
Calcium: 9.4 mg/dL (ref 8.9–10.3)
Chloride: 107 mmol/L (ref 98–111)
Creatinine: 1.7 mg/dL — ABNORMAL HIGH (ref 0.61–1.24)
GFR, Estimated: 44 mL/min — ABNORMAL LOW (ref >60)
Glucose, Bld: 189 mg/dL — ABNORMAL HIGH (ref 70–99)
Potassium: 4 mmol/L (ref 3.5–5.1)
Sodium: 142 mmol/L (ref 135–145)
Total Bilirubin: 0.5 mg/dL (ref 0.3–1.2)
Total Protein: 7.2 g/dL (ref 6.5–8.1)

## 2022-01-27 MED ORDER — SODIUM CHLORIDE 0.9 % IV SOLN
200.0000 mg | Freq: Once | INTRAVENOUS | Status: AC
  Administered 2022-01-27: 200 mg via INTRAVENOUS
  Filled 2022-01-27: qty 8

## 2022-01-27 MED ORDER — LEUPROLIDE ACETATE (4 MONTH) 30 MG ~~LOC~~ KIT
30.0000 mg | PACK | Freq: Once | SUBCUTANEOUS | Status: DC
  Filled 2022-01-27: qty 30

## 2022-01-27 MED ORDER — SODIUM CHLORIDE 0.9 % IV SOLN: Freq: Once | INTRAVENOUS | Status: AC

## 2022-01-27 NOTE — Progress Notes (Signed)
Patient presents for treatment. RN assessment completed along with the following: ? ?Labs/vitals reviewed - Yes, and Per Ned Card, NP ok to proceed with treatment with Scr 1.70     ?Weight within 10% of previous measurement - Yes ?Informed consent completed and reflects current therapy/intent - Yes, on date 10/22/21             ?Provider progress note reviewed - Patient not seen by provider today. Most recent note dated 01/21/22 reviewed. ?Treatment/Antibody/Supportive plan reviewed - Yes, and there are no adjustments needed for today's treatment. ?S&H and other orders reviewed - Yes, and there are no additional orders identified. ?Previous treatment date reviewed - Yes, and the appropriate amount of time has elapsed between treatments. ? ?Patient to proceed with treatment.   ?

## 2022-01-27 NOTE — Patient Instructions (Signed)
[facility]   Discharge Instructions: Thank you for choosing [facility] to provide your oncology and hematology care.   If you have a lab appointment with the Cancer Center, please go directly to the Cancer Center and check in at the registration area.   Wear comfortable clothing and clothing appropriate for easy access to any Portacath or PICC line.   We strive to give you quality time with your provider. You may need to reschedule your appointment if you arrive late (15 or more minutes).  Arriving late affects you and other patients whose appointments are after yours.  Also, if you miss three or more appointments without notifying the office, you may be dismissed from the clinic at the provider's discretion.      For prescription refill requests, have your pharmacy contact our office and allow 72 hours for refills to be completed.    Today you received the following chemotherapy and/or immunotherapy agents Pembrolizumab (KEYTRUDA).      To help prevent nausea and vomiting after your treatment, we encourage you to take your nausea medication as directed.  BELOW ARE SYMPTOMS THAT SHOULD BE REPORTED IMMEDIATELY: *FEVER GREATER THAN 100.4 F (38 C) OR HIGHER *CHILLS OR SWEATING *NAUSEA AND VOMITING THAT IS NOT CONTROLLED WITH YOUR NAUSEA MEDICATION *UNUSUAL SHORTNESS OF BREATH *UNUSUAL BRUISING OR BLEEDING *URINARY PROBLEMS (pain or burning when urinating, or frequent urination) *BOWEL PROBLEMS (unusual diarrhea, constipation, pain near the anus) TENDERNESS IN MOUTH AND THROAT WITH OR WITHOUT PRESENCE OF ULCERS (sore throat, sores in mouth, or a toothache) UNUSUAL RASH, SWELLING OR PAIN  UNUSUAL VAGINAL DISCHARGE OR ITCHING   Items with * indicate a potential emergency and should be followed up as soon as possible or go to the Emergency Department if any problems should occur.  Please show the CHEMOTHERAPY ALERT CARD or IMMUNOTHERAPY ALERT CARD  at check-in to the Emergency Department and triage nurse.  Should you have questions after your visit or need to cancel or reschedule your appointment, please contact [facility]  Dept: [phone]  and follow the prompts.  Office hours are 8:00 a.m. to 4:30 p.m. Monday - Friday. Please note that voicemails left after 4:00 p.m. may not be returned until the following business day.  We are closed weekends and major holidays. You have access to a nurse at all times for urgent questions. Please call the main number to the clinic Dept: [phone] and follow the prompts.   For any non-urgent questions, you may also contact your provider using MyChart. We now offer e-Visits for anyone 18 and older to request care online for non-urgent symptoms. For details visit [email or web address].   Also download the MyChart app! Go to the app store, search "MyChart", open the app, select [facility], and log in with your MyChart username and password.  Due to Covid, a mask is required upon entering the hospital/clinic. If you do not have a mask, one will be given to you upon arrival. For doctor visits, patients may have 1 support person aged 18 or older with them. For treatment visits, patients cannot have anyone with them due to current Covid guidelines and our immunocompromised population.   Pembrolizumab injection What is this medication? PEMBROLIZUMAB (pem broe liz ue mab) is a monoclonal antibody. It is used to treat certain types of cancer. This medicine may be used for other purposes; ask your health care provider or pharmacist if you have questions. COMMON BRAND NAME(S):   Keytruda What should I tell my care team before I take this medication? They need to know if you have any of these conditions: autoimmune diseases like Crohn's disease, ulcerative colitis, or lupus have had or planning to have an allogeneic stem cell transplant (uses someone else's stem cells) history of  organ transplant history of chest radiation nervous system problems like myasthenia gravis or Guillain-Barre syndrome an unusual or allergic reaction to pembrolizumab, other medicines, foods, dyes, or preservatives pregnant or trying to get pregnant breast-feeding How should I use this medication? This medicine is for infusion into a vein. It is given by a health care professional in a hospital or clinic setting. A special MedGuide will be given to you before each treatment. Be sure to read this information carefully each time. Talk to your pediatrician regarding the use of this medicine in children. While this drug may be prescribed for children as young as 6 months for selected conditions, precautions do apply. Overdosage: If you think you have taken too much of this medicine contact a poison control center or emergency room at once. NOTE: This medicine is only for you. Do not share this medicine with others. What if I miss a dose? It is important not to miss your dose. Call your doctor or health care professional if you are unable to keep an appointment. What may interact with this medication? Interactions have not been studied. This list may not describe all possible interactions. Give your health care provider a list of all the medicines, herbs, non-prescription drugs, or dietary supplements you use. Also tell them if you smoke, drink alcohol, or use illegal drugs. Some items may interact with your medicine. What should I watch for while using this medication? Your condition will be monitored carefully while you are receiving this medicine. You may need blood work done while you are taking this medicine. Do not become pregnant while taking this medicine or for 4 months after stopping it. Women should inform their doctor if they wish to become pregnant or think they might be pregnant. There is a potential for serious side effects to an unborn child. Talk to your health care professional or  pharmacist for more information. Do not breast-feed an infant while taking this medicine or for 4 months after the last dose. What side effects may I notice from receiving this medication? Side effects that you should report to your doctor or health care professional as soon as possible: allergic reactions like skin rash, itching or hives, swelling of the face, lips, or tongue bloody or black, tarry breathing problems changes in vision chest pain chills confusion constipation cough diarrhea dizziness or feeling faint or lightheaded fast or irregular heartbeat fever flushing joint pain low blood counts - this medicine may decrease the number of white blood cells, red blood cells and platelets. You may be at increased risk for infections and bleeding. muscle pain muscle weakness pain, tingling, numbness in the hands or feet persistent headache redness, blistering, peeling or loosening of the skin, including inside the mouth signs and symptoms of high blood sugar such as dizziness; dry mouth; dry skin; fruity breath; nausea; stomach pain; increased hunger or thirst; increased urination signs and symptoms of kidney injury like trouble passing urine or change in the amount of urine signs and symptoms of liver injury like dark urine, light-colored stools, loss of appetite, nausea, right upper belly pain, yellowing of the eyes or skin sweating swollen lymph nodes weight loss Side effects that usually do not   require medical attention (report to your doctor or health care professional if they continue or are bothersome): decreased appetite hair loss tiredness This list may not describe all possible side effects. Call your doctor for medical advice about side effects. You may report side effects to FDA at 1-800-FDA-1088. Where should I keep my medication? This drug is given in a hospital or clinic and will not be stored at home. NOTE: This sheet is a summary. It may not cover all possible  information. If you have questions about this medicine, talk to your doctor, pharmacist, or health care provider.  2023 Elsevier/Gold Standard (2021-08-13 00:00:00)  

## 2022-02-11 ENCOUNTER — Inpatient Hospital Stay: Payer: Medicare Other

## 2022-02-11 ENCOUNTER — Inpatient Hospital Stay: Payer: Medicare Other | Admitting: Nurse Practitioner

## 2022-02-11 ENCOUNTER — Encounter: Payer: Self-pay | Admitting: Nurse Practitioner

## 2022-02-11 ENCOUNTER — Other Ambulatory Visit: Payer: Self-pay | Admitting: Oncology

## 2022-02-11 ENCOUNTER — Inpatient Hospital Stay: Payer: Medicare Other | Admitting: Oncology

## 2022-02-11 VITALS — BP 143/75 | HR 80 | Temp 98.1°F | Resp 18 | Ht 65.0 in | Wt 143.0 lb

## 2022-02-11 DIAGNOSIS — C61 Malignant neoplasm of prostate: Secondary | ICD-10-CM

## 2022-02-11 DIAGNOSIS — Z5112 Encounter for antineoplastic immunotherapy: Secondary | ICD-10-CM | POA: Diagnosis not present

## 2022-02-11 DIAGNOSIS — C641 Malignant neoplasm of right kidney, except renal pelvis: Secondary | ICD-10-CM

## 2022-02-11 LAB — CBC WITH DIFFERENTIAL (CANCER CENTER ONLY)
Abs Immature Granulocytes: 0.02 10*3/uL (ref 0.00–0.07)
Basophils Absolute: 0 10*3/uL (ref 0.0–0.1)
Basophils Relative: 1 %
Eosinophils Absolute: 0.1 10*3/uL (ref 0.0–0.5)
Eosinophils Relative: 2 %
HCT: 31.3 % — ABNORMAL LOW (ref 39.0–52.0)
Hemoglobin: 9.4 g/dL — ABNORMAL LOW (ref 13.0–17.0)
Immature Granulocytes: 0 %
Lymphocytes Relative: 29 %
Lymphs Abs: 1.8 10*3/uL (ref 0.7–4.0)
MCH: 28.9 pg (ref 26.0–34.0)
MCHC: 30 g/dL (ref 30.0–36.0)
MCV: 96.3 fL (ref 80.0–100.0)
Monocytes Absolute: 0.5 10*3/uL (ref 0.1–1.0)
Monocytes Relative: 8 %
Neutro Abs: 3.6 10*3/uL (ref 1.7–7.7)
Neutrophils Relative %: 60 %
Platelet Count: 197 10*3/uL (ref 150–400)
RBC: 3.25 MIL/uL — ABNORMAL LOW (ref 4.22–5.81)
RDW: 14.1 % (ref 11.5–15.5)
WBC Count: 6.1 10*3/uL (ref 4.0–10.5)
nRBC: 0 % (ref 0.0–0.2)

## 2022-02-11 LAB — CMP (CANCER CENTER ONLY)
ALT: 12 U/L (ref 0–44)
AST: 13 U/L — ABNORMAL LOW (ref 15–41)
Albumin: 3.9 g/dL (ref 3.5–5.0)
Alkaline Phosphatase: 55 U/L (ref 38–126)
Anion gap: 11 (ref 5–15)
BUN: 26 mg/dL — ABNORMAL HIGH (ref 8–23)
CO2: 24 mmol/L (ref 22–32)
Calcium: 9.3 mg/dL (ref 8.9–10.3)
Chloride: 108 mmol/L (ref 98–111)
Creatinine: 1.75 mg/dL — ABNORMAL HIGH (ref 0.61–1.24)
GFR, Estimated: 43 mL/min — ABNORMAL LOW (ref >60)
Glucose, Bld: 132 mg/dL — ABNORMAL HIGH (ref 70–99)
Potassium: 3.6 mmol/L (ref 3.5–5.1)
Sodium: 143 mmol/L (ref 135–145)
Total Bilirubin: 0.5 mg/dL (ref 0.3–1.2)
Total Protein: 6.9 g/dL (ref 6.5–8.1)

## 2022-02-11 LAB — TSH: TSH: 4.532 u[IU]/mL — ABNORMAL HIGH (ref 0.350–4.500)

## 2022-02-11 NOTE — Progress Notes (Signed)
Brandon Peters OFFICE PROGRESS NOTE   Diagnosis: Renal cell carcinoma, prostate cancer  INTERVAL HISTORY:   Brandon Peters returns as scheduled.  He completed another cycle of Pembrolizumab 01/27/2022.  He resumed axitinib at a dose of 2 mg daily 01/28/2022.  He denies nausea/vomiting.  No mouth sores.  Diarrhea has resolved.  No rash.  He notes some discomfort left foot with walking.  Objective:  Vital signs in last 24 hours:  Blood pressure (!) 143/75, pulse 80, temperature 98.1 F (36.7 C), temperature source Oral, resp. rate 18, height '5\' 5"'  (1.651 m), weight 143 lb (64.9 kg), SpO2 100 %.    HEENT: No thrush or ulcers. Resp: Lungs clear bilaterally. Cardio: Regular rate and rhythm. GI: Abdomen soft and nontender.  No hepatosplenomegaly.  No mass. Vascular: No leg edema. Skin: Right palm is dry appearing, this is chronic.  Soles with mild erythema, dryness at the heels.   Lab Results:  Lab Results  Component Value Date   WBC 6.1 02/11/2022   HGB 9.4 (L) 02/11/2022   HCT 31.3 (L) 02/11/2022   MCV 96.3 02/11/2022   PLT 197 02/11/2022   NEUTROABS 3.6 02/11/2022    Imaging:  No results found.  Medications: I have reviewed the patient's current medications.  Assessment/Plan: History of Severe anemia-status post a red cell transfusion 03/04/2016 2.. Prostate cancer Extensive sclerotic bone metastases noted on a CT of the abdomen/pelvis 03/04/2016 and MRI abdomen 03/05/2016 Markedly elevated PSA Lupron initiated 03/15/2016; Casodex 14 days beginning 03/15/2016 Prostate biopsy 03/18/2016 05/09/2016 PSA significantly improved at 21 Abiraterone initiated 05/10/2016 PSA less than 0.1 07/10/2018 PSA less than 0.1 08/30/2018 PSA less than 0.1 10/18/2018 PSA less than 0.1 11/29/2018 PSA less than 0.1 on 11/06/2019  PSA less than 0.1 on 03/05/2020 PSA less than 0.1 on 07/06/2020 PSA less than 0.1 08/17/2021 PSA less than 0.1 on 09/30/2021 PSA 0.1 on 11/12/2021   3.    Symptoms of obstructive uropathy. Improved   4.   Distal duodenal and descending colon masses biopsy of the duodenal mass 03/04/2016 confirmed fragments of small bowel mucosa with focal high-grade dysplasia Biopsy of the descending colon "polyps" revealed fragments of a tubulovillous adenoma CT 08/29/2017-similar soft tissue thickening in the region of the splenic flexure of the colon suboptimally evaluated due to lack of colonic enteric contrast Referred to Dr. Benson Norway Referred to [facility] Resection of descending colon polyps 01/31/2018, tubulovillous adenomas with high-grade dysplasia Attempted endoscopic resection of the fourth segment duodenal "polyp" on 05/09/2018, firm unresectable lesion, biopsy revealed moderately differentiated adenocarcinoma CT abdomen/pelvis 05/25/2018- heterogeneously enhancing right lower pole renal lesion; diffuse sclerotic osseous metastatic lesions compatible with history of prostate cancer CT chest 05/25/2018- no pulmonary metastasis.  Diffuse osseous metastasis involving the axial and appendicular skeleton. 06/19/2018-sleeve resection of third and fourth portions of duodenum with primary end-to-end anastomosis, feeding gastrojejunostomy tube placement, Dr. Cloyd Stagers; poorly differentiated adenocarcinoma duodenum, 3 cm; tumor invades the muscularis propria; lymphovascular invasion present; negative margins; 4 of 11 involved lymph nodes; soft tissue deposits also seen in the mesentery; all mucosal margins and soft tissue margins negative but 2 of the positive nodes are found in the soft tissue margin submitted for frozen section (pT2 pN2); second mucosal lesion 1.2 cm from the proximal margin, tubular adenoma with focal severe dysplasia Intact expression of mismatch repair proteins by IHC; microsatellite stable Cycle 1 adjuvant Xeloda 07/23/2018 Cycle 2 adjuvant Xeloda 08/13/2018 Cycle 3 adjuvant Xeloda 09/10/2018 (dose reduced to 1000 mg twice daily for 14  days)  Cycle 4 adjuvant  Xeloda 10/01/2018 (dose increased to 1500 mg every morning and 1000 mg every afternoon for 14 days) Cycle 5 adjuvant Xeloda 10/22/2018 Cycle 6 adjuvant Xeloda 11/12/2018 Cycle 7 adjuvant Xeloda 12/03/2018 Cycle 8 adjuvant Xeloda 12/24/2018   5.  Anorexia/weight loss- resolved   6.  Undescended testicles   7.  Right renal mass.  CT 08/29/2017-right renal mass involving the lower pole was partially calcified and does demonstrate enhancement following contrast most consistent with renal cell carcinoma.  Lesion not significantly enlarged compared with the prior CT. Followed by urology. 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 to the IVC/RA junction, upper normal size retrocaval node 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, nuclear grade 3 Axitinib/pembrolizumab 10/22/2021 Axitinib placed on hold 11/05/2021 due to hand-foot syndrome Axitinib resumed at a dose of 5 mg daily 11/12/2021, pembrolizumab 11/12/2021 Axitinib discontinued 12/03/2021 secondary to progressive hand-foot syndrome, pembrolizumab 12/03/2021 Axitinib resumed at a reduced dose of 2 mg twice daily after office visit 12/14/2021 Pembrolizumab 12/24/2021 Axitinib stopped 01/03/2022 secondary to persistent hand/foot symptoms CT chest at [facility] 01/13/2022-no evidence of thoracic metastatic disease, similar diffuse sclerotic change in the skeleton MRI abdomen at [facility] 01/13/2022-similar size of right lower pole renal mass  with renal vein thrombosis, decreased overall extent of thrombus now extending to the intrahepatic IVC, enhancement of right renal mass and thrombus is decreased, new left hydroureter ureter nephrosis with asymmetric perinephric stranding Pembrolizumab 01/27/2022 Axitinib resumed 2 mg daily 01/28/2022 CT abdomen/pelvis at [facility] 02/10/2022-overall reduced size of right lower pole renal mass which demonstrates persistent region of enhancement along the posterior and lateral margin with extension into the renal sinus.  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  Query additional IVC thrombus at the intrahepatic IVC and extending into the right atrium.   8.  Diabetes   9.  Anemia-stool Hemoccults + March 2021, referred to Dr. Benson Norway for endoscopic evaluation, anemia also secondary to metastatic prostate cancer, hypogonadism Small bowel enteroscopy 02/11/2020-esophagus normal.  Hematin found in the gastric body.  Examined duodenum normal.  No evidence of significant pathology in the entire examined portion of the jejunum.  There was no overt source of bleeding or inflammation.  The bleeding may be from small AVMs.  Surgical anastomosis was not visible with repeated inspection of the duodenum. Colonoscopy 02/11/2020-multiple sessile and semipedunculated polyps were found in the rectum, descending colon and ascending colon, 3 to 18 mm in size.  Polyps removed (polypectomy descending colon-fragments of tubular adenoma, inflammatory type polyp; polypectomy ascending colon-tubular adenoma, no high-grade dysplasia or malignancy; polypectomy descending colon-fragments of tubulovillous adenoma, no high-grade dysplasia or malignancy; polypectomy colon, rectum-tubular adenoma, no high-grade dysplasia or malignancy). Colonoscopy 02/02/2021-seven 2 to 10 mm polyps in the sigmoid colon (tubular adenoma), descending colon (fragments of tubulovillous adenoma, fragments of tubular adenoma ) and transverse colon  (fragments of tubular adenoma). Progressive anemia 07/05/2021; stool cards positive Upper endoscopy/colonoscopy 09/23/2021-multiple polyps removed from the colon-tubular adenomas, diffuse angioectasias in the stomach with bleeding-not amenable to endoscopic treatment 10.  Renal failure-progressive 01/17/2022 11.  CT chest at [facility] 01/13/2022-no evidence of thoracic metastatic disease, similar diffuse sclerotic changes in the skeleton 12.  CT renal stone study 01/14/2022-mild left side hydroureteronephrosis secondary to a left UVJ stone, left nephrolithiasis, new right middle lobe collapse/consolidation, edema/fluid at the left iliopsoas muscle    Disposition: Brandon Peters appears stable.  He will continue axitinib at the current dose of 2 mg daily.  He understands to discontinue and contact the office with progressive hand/foot symptoms.  Next Pembrolizumab in 1 week.  We will also schedule a Lupron injection in 1 week.  Lab, follow-up, Pembrolizumab and Lupron 02/18/2022.    Ned Card ANP/GNP-BC   02/11/2022  8:27 AM

## 2022-02-16 ENCOUNTER — Other Ambulatory Visit (HOSPITAL_COMMUNITY): Payer: Self-pay

## 2022-02-18 ENCOUNTER — Encounter: Payer: Self-pay | Admitting: Nurse Practitioner

## 2022-02-18 ENCOUNTER — Encounter: Payer: Self-pay | Admitting: *Deleted

## 2022-02-18 ENCOUNTER — Other Ambulatory Visit (HOSPITAL_COMMUNITY): Payer: Self-pay

## 2022-02-18 ENCOUNTER — Inpatient Hospital Stay: Payer: Medicare Other | Admitting: Nurse Practitioner

## 2022-02-18 ENCOUNTER — Inpatient Hospital Stay: Payer: Medicare Other

## 2022-02-18 VITALS — BP 147/81 | HR 82 | Temp 98.2°F | Resp 18 | Ht 65.0 in | Wt 143.6 lb

## 2022-02-18 DIAGNOSIS — C641 Malignant neoplasm of right kidney, except renal pelvis: Secondary | ICD-10-CM

## 2022-02-18 DIAGNOSIS — C61 Malignant neoplasm of prostate: Secondary | ICD-10-CM

## 2022-02-18 DIAGNOSIS — C179 Malignant neoplasm of small intestine, unspecified: Secondary | ICD-10-CM | POA: Diagnosis not present

## 2022-02-18 DIAGNOSIS — Z5112 Encounter for antineoplastic immunotherapy: Secondary | ICD-10-CM | POA: Diagnosis not present

## 2022-02-18 LAB — CBC WITH DIFFERENTIAL (CANCER CENTER ONLY)
Abs Immature Granulocytes: 0.02 10*3/uL (ref 0.00–0.07)
Basophils Absolute: 0 10*3/uL (ref 0.0–0.1)
Basophils Relative: 1 %
Eosinophils Absolute: 0.1 10*3/uL (ref 0.0–0.5)
Eosinophils Relative: 2 %
HCT: 33.5 % — ABNORMAL LOW (ref 39.0–52.0)
Hemoglobin: 10.2 g/dL — ABNORMAL LOW (ref 13.0–17.0)
Immature Granulocytes: 0 %
Lymphocytes Relative: 30 %
Lymphs Abs: 1.8 10*3/uL (ref 0.7–4.0)
MCH: 29.3 pg (ref 26.0–34.0)
MCHC: 30.4 g/dL (ref 30.0–36.0)
MCV: 96.3 fL (ref 80.0–100.0)
Monocytes Absolute: 0.5 10*3/uL (ref 0.1–1.0)
Monocytes Relative: 9 %
Neutro Abs: 3.5 10*3/uL (ref 1.7–7.7)
Neutrophils Relative %: 58 %
Platelet Count: 206 10*3/uL (ref 150–400)
RBC: 3.48 MIL/uL — ABNORMAL LOW (ref 4.22–5.81)
RDW: 14.4 % (ref 11.5–15.5)
WBC Count: 6 10*3/uL (ref 4.0–10.5)
nRBC: 0 % (ref 0.0–0.2)

## 2022-02-18 LAB — CMP (CANCER CENTER ONLY)
ALT: 12 U/L (ref 0–44)
AST: 13 U/L — ABNORMAL LOW (ref 15–41)
Albumin: 4.1 g/dL (ref 3.5–5.0)
Alkaline Phosphatase: 59 U/L (ref 38–126)
Anion gap: 10 (ref 5–15)
BUN: 31 mg/dL — ABNORMAL HIGH (ref 8–23)
CO2: 22 mmol/L (ref 22–32)
Calcium: 9.3 mg/dL (ref 8.9–10.3)
Chloride: 111 mmol/L (ref 98–111)
Creatinine: 1.62 mg/dL — ABNORMAL HIGH (ref 0.61–1.24)
GFR, Estimated: 47 mL/min — ABNORMAL LOW (ref >60)
Glucose, Bld: 126 mg/dL — ABNORMAL HIGH (ref 70–99)
Potassium: 3.7 mmol/L (ref 3.5–5.1)
Sodium: 143 mmol/L (ref 135–145)
Total Bilirubin: 0.4 mg/dL (ref 0.3–1.2)
Total Protein: 7.1 g/dL (ref 6.5–8.1)

## 2022-02-18 LAB — TOTAL PROTEIN, URINE DIPSTICK: Protein, ur: 30 mg/dL — AB

## 2022-02-18 MED ORDER — SODIUM CHLORIDE 0.9 % IV SOLN: Freq: Once | INTRAVENOUS | Status: AC

## 2022-02-18 MED ORDER — SODIUM CHLORIDE 0.9 % IV SOLN
200.0000 mg | Freq: Once | INTRAVENOUS | Status: AC
  Administered 2022-02-18: 200 mg via INTRAVENOUS
  Filled 2022-02-18: qty 8

## 2022-02-18 MED ORDER — LEUPROLIDE ACETATE (4 MONTH) 30 MG ~~LOC~~ KIT
30.0000 mg | PACK | Freq: Once | SUBCUTANEOUS | Status: AC
  Administered 2022-02-18: 30 mg via SUBCUTANEOUS
  Filled 2022-02-18: qty 30

## 2022-02-18 NOTE — Progress Notes (Signed)
[place] OFFICE PROGRESS NOTE   Diagnosis: Renal cell carcinoma, prostate cancer  INTERVAL HISTORY:   Brandon Peters returns as scheduled.  He continues axitinib 2 mg daily.  He completed another cycle of Pembrolizumab 01/27/2022.  He denies nausea/vomiting.  No mouth sores.  No diarrhea.  No rash.  He has a good appetite.  He has mild discomfort along the base of the toes on his left foot.  Objective:  Vital signs in last 24 hours:  Blood pressure (!) 147/81, pulse 82, temperature 98.2 F (36.8 C), temperature source Oral, resp. rate 18, height _0  (1.651 m), weight 143 lb 9.6 oz (65.1 kg), SpO2 100 %.    HEENT: No thrush or ulcers. Resp: Lungs clear bilaterally. Cardio: Regular rate and rhythm. GI: Abdomen soft and nontender.  No hepatomegaly.  No mass. Vascular: No leg edema. Skin: Right palm is chronically dry appearing.  Soles with mild erythema, dryness.  No skin breakdown.   Lab Results:  Lab Results  Component Value Date   WBC 6.0 02/18/2022   HGB 10.2 (L) 02/18/2022   HCT 33.5 (L) 02/18/2022   MCV 96.3 02/18/2022   PLT 206 02/18/2022   NEUTROABS 3.5 02/18/2022    Imaging:  No results found.  Medications: I have reviewed the patient's current medications.  Assessment/Plan: History of Severe anemia-status post a red cell transfusion 03/04/2016 2.. Prostate cancer Extensive sclerotic bone metastases noted on a CT of the abdomen/pelvis 03/04/2016 and MRI abdomen 03/05/2016 Markedly elevated PSA Lupron initiated 03/15/2016; Casodex 14 days beginning 03/15/2016 Prostate biopsy 03/18/2016 05/09/2016 PSA significantly improved at 21 Abiraterone initiated 05/10/2016 PSA less than 0.1 07/10/2018 PSA less than 0.1 08/30/2018 PSA less than 0.1 10/18/2018 PSA less than 0.1 11/29/2018 PSA less than 0.1 on 11/06/2019  PSA less than 0.1 on 03/05/2020 PSA less than 0.1 on 07/06/2020 PSA less than 0.1 08/17/2021 PSA less than 0.1 on 09/30/2021 PSA 0.1 on  11/12/2021   3.   Symptoms of obstructive uropathy. Improved   4.   Distal duodenal and descending colon masses biopsy of the duodenal mass 03/04/2016 confirmed fragments of small bowel mucosa with focal high-grade dysplasia Biopsy of the descending colon "polyps" revealed fragments of a tubulovillous adenoma CT 08/29/2017-similar soft tissue thickening in the region of the splenic flexure of the colon suboptimally evaluated due to lack of colonic enteric contrast Referred to Dr. Benson Norway Referred to [facility] Resection of descending colon polyps 01/31/2018, tubulovillous adenomas with high-grade dysplasia Attempted endoscopic resection of the fourth segment duodenal "polyp" on 05/09/2018, firm unresectable lesion, biopsy revealed moderately differentiated adenocarcinoma CT abdomen/pelvis 05/25/2018- heterogeneously enhancing right lower pole renal lesion; diffuse sclerotic osseous metastatic lesions compatible with history of prostate cancer CT chest 05/25/2018- no pulmonary metastasis.  Diffuse osseous metastasis involving the axial and appendicular skeleton. 06/19/2018-sleeve resection of third and fourth portions of duodenum with primary end-to-end anastomosis, feeding gastrojejunostomy tube placement, Dr. Cloyd Stagers; poorly differentiated adenocarcinoma duodenum, 3 cm; tumor invades the muscularis propria; lymphovascular invasion present; negative margins; 4 of 11 involved lymph nodes; soft tissue deposits also seen in the mesentery; all mucosal margins and soft tissue margins negative but 2 of the positive nodes are found in the soft tissue margin submitted for frozen section (pT2 pN2); second mucosal lesion 1.2 cm from the proximal margin, tubular adenoma with focal severe dysplasia Intact expression of mismatch repair proteins by IHC; microsatellite stable Cycle 1 adjuvant Xeloda 07/23/2018 Cycle 2 adjuvant Xeloda 08/13/2018 Cycle 3 adjuvant Xeloda 09/10/2018 (dose reduced  to 1000 mg twice daily for 14 days)   Cycle 4 adjuvant Xeloda 10/01/2018 (dose increased to 1500 mg every morning and 1000 mg every afternoon for 14 days) Cycle 5 adjuvant Xeloda 10/22/2018 Cycle 6 adjuvant Xeloda 11/12/2018 Cycle 7 adjuvant Xeloda 12/03/2018 Cycle 8 adjuvant Xeloda 12/24/2018   5.  Anorexia/weight loss- resolved   6.  Undescended testicles   7.  Right renal mass.  CT 08/29/2017-right renal mass involving the lower pole was partially calcified and does demonstrate enhancement following contrast most consistent with renal cell carcinoma.  Lesion not significantly enlarged compared with the prior CT. Followed by urology. 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 to the IVC/RA junction, upper normal size retrocaval node 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 01/13/2022-no evidence of thoracic metastatic disease, similar diffuse sclerotic change in the skeleton MRI abdomen at [facility] 01/13/2022-similar size of right  lower pole renal mass with renal vein thrombosis, decreased overall extent of thrombus now extending to the intrahepatic IVC, enhancement of right renal mass and thrombus is decreased, new left hydroureter ureter nephrosis with asymmetric perinephric stranding Pembrolizumab 01/27/2022 Axitinib resumed 2 mg daily 01/28/2022 CT abdomen/pelvis at [facility] 02/10/2022-overall reduced size of right lower pole renal mass which demonstrates persistent region of enhancement along the posterior and lateral margin with extension into the renal sinus.  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  Query additional IVC thrombus at the intrahepatic IVC and extending into the right atrium.   8.  Diabetes   9.  Anemia-stool Hemoccults + March 2021, referred to Dr. Benson Norway for endoscopic evaluation, anemia also secondary to metastatic prostate cancer, hypogonadism Small bowel enteroscopy 02/11/2020-esophagus normal.  Hematin found in the gastric body.  Examined duodenum normal.  No evidence of significant pathology in the entire examined portion of the jejunum.  There was no overt source of bleeding or inflammation.  The bleeding may be from small AVMs.  Surgical anastomosis was not visible with repeated inspection of the duodenum. Colonoscopy 02/11/2020-multiple sessile and semipedunculated polyps were found in the rectum, descending colon and ascending colon, 3 to 18 mm in size.  Polyps removed (polypectomy descending colon-fragments of tubular adenoma, inflammatory type polyp; polypectomy ascending colon-tubular adenoma, no high-grade dysplasia or malignancy; polypectomy descending colon-fragments of tubulovillous adenoma, no high-grade dysplasia or malignancy; polypectomy colon, rectum-tubular adenoma, no high-grade dysplasia or malignancy). Colonoscopy 02/02/2021-seven 2 to 10 mm polyps in the sigmoid colon (tubular adenoma), descending colon (fragments of tubulovillous adenoma, fragments of tubular adenoma )  and transverse colon (fragments of tubular adenoma). Progressive anemia 07/05/2021; stool cards positive Upper endoscopy/colonoscopy 09/23/2021-multiple polyps removed from the colon-tubular adenomas, diffuse angioectasias in the stomach  with bleeding-not amenable to endoscopic treatment 10.  Renal failure-progressive 01/17/2022 11.  CT chest at [facility] 01/13/2022-no evidence of thoracic metastatic disease, similar diffuse sclerotic changes in the skeleton 12.  CT renal stone study 01/14/2022-mild left side hydroureteronephrosis secondary to a left UVJ stone, left nephrolithiasis, new right middle lobe collapse/consolidation, edema/fluid at the left iliopsoas muscle  Disposition: Mr. Horsey appears stable.  He will continue axitinib as he is currently taking.  He will contact the office if he develops progressive hand/foot symptoms.  He is scheduled for Pembrolizumab today, plan to proceed.  He plans to contact [facility] for a follow-up appointment to discuss potential surgery.  He continues [employer] investment [employer].  He will receive a Lupron injection today.  We will follow-up on the PSA.  CBC and chemistry panel reviewed.  Labs adequate to proceed as above.  Creatinine stable at 1.62.  He will return for lab, follow-up, Pembrolizumab in 3 weeks.  We are available to see him sooner if needed.    Ned Card ANP/GNP-BC   02/18/2022  9:05 AM

## 2022-02-18 NOTE — Progress Notes (Signed)
Patient seen by Ned Card NP today  Vitals are within treatment parameters.  Labs reviewed by Ned Card NP and are not all within treatment parameters. Creatinine 1.62  Per physician team, patient is ready for treatment and there are NO modifications to the treatment plan.

## 2022-02-18 NOTE — Patient Instructions (Signed)
[facility]   Discharge Instructions: Thank you for choosing [facility] to provide your oncology and hematology care.   If you have a lab appointment with the Cancer Center, please go directly to the Cancer Center and check in at the registration area.   Wear comfortable clothing and clothing appropriate for easy access to any Portacath or PICC line.   We strive to give you quality time with your provider. You may need to reschedule your appointment if you arrive late (15 or more minutes).  Arriving late affects you and other patients whose appointments are after yours.  Also, if you miss three or more appointments without notifying the office, you may be dismissed from the clinic at the provider's discretion.      For prescription refill requests, have your pharmacy contact our office and allow 72 hours for refills to be completed.    Today you received the following chemotherapy and/or immunotherapy agents Pembrolizumab (KEYTRUDA).      To help prevent nausea and vomiting after your treatment, we encourage you to take your nausea medication as directed.  BELOW ARE SYMPTOMS THAT SHOULD BE REPORTED IMMEDIATELY: *FEVER GREATER THAN 100.4 F (38 C) OR HIGHER *CHILLS OR SWEATING *NAUSEA AND VOMITING THAT IS NOT CONTROLLED WITH YOUR NAUSEA MEDICATION *UNUSUAL SHORTNESS OF BREATH *UNUSUAL BRUISING OR BLEEDING *URINARY PROBLEMS (pain or burning when urinating, or frequent urination) *BOWEL PROBLEMS (unusual diarrhea, constipation, pain near the anus) TENDERNESS IN MOUTH AND THROAT WITH OR WITHOUT PRESENCE OF ULCERS (sore throat, sores in mouth, or a toothache) UNUSUAL RASH, SWELLING OR PAIN  UNUSUAL VAGINAL DISCHARGE OR ITCHING   Items with * indicate a potential emergency and should be followed up as soon as possible or go to the Emergency Department if any problems should occur.  Please show the CHEMOTHERAPY ALERT CARD or IMMUNOTHERAPY ALERT CARD  at check-in to the Emergency Department and triage nurse.  Should you have questions after your visit or need to cancel or reschedule your appointment, please contact [facility]  Dept: [phone]  and follow the prompts.  Office hours are 8:00 a.m. to 4:30 p.m. Monday - Friday. Please note that voicemails left after 4:00 p.m. may not be returned until the following business day.  We are closed weekends and major holidays. You have access to a nurse at all times for urgent questions. Please call the main number to the clinic Dept: [phone] and follow the prompts.   For any non-urgent questions, you may also contact your provider using MyChart. We now offer e-Visits for anyone 18 and older to request care online for non-urgent symptoms. For details visit [email or web address].   Also download the MyChart app! Go to the app store, search "MyChart", open the app, select [place], and log in with your MyChart username and password.  Due to Covid, a mask is required upon entering the hospital/clinic. If you do not have a mask, one will be given to you upon arrival. For doctor visits, patients may have 1 support person aged 18 or older with them. For treatment visits, patients cannot have anyone with them due to current Covid guidelines and our immunocompromised population.   Pembrolizumab injection What is this medication? PEMBROLIZUMAB (pem broe liz ue mab) is a monoclonal antibody. It is used to treat certain types of cancer. This medicine may be used for other purposes; ask your health care provider or pharmacist if you have questions. COMMON BRAND NAME(S):   Keytruda What should I tell my care team before I take this medication? They need to know if you have any of these conditions: autoimmune diseases like Crohn's disease, ulcerative colitis, or lupus have had or planning to have an allogeneic stem cell transplant (uses someone else's stem cells) history of  organ transplant history of chest radiation nervous system problems like myasthenia gravis or Guillain-Barre syndrome an unusual or allergic reaction to pembrolizumab, other medicines, foods, dyes, or preservatives pregnant or trying to get pregnant breast-feeding How should I use this medication? This medicine is for infusion into a vein. It is given by a health care professional in a hospital or clinic setting. A special MedGuide will be given to you before each treatment. Be sure to read this information carefully each time. Talk to your pediatrician regarding the use of this medicine in children. While this drug may be prescribed for children as young as 6 months for selected conditions, precautions do apply. Overdosage: If you think you have taken too much of this medicine contact a poison control center or emergency room at once. NOTE: This medicine is only for you. Do not share this medicine with others. What if I miss a dose? It is important not to miss your dose. Call your doctor or health care professional if you are unable to keep an appointment. What may interact with this medication? Interactions have not been studied. This list may not describe all possible interactions. Give your health care provider a list of all the medicines, herbs, non-prescription drugs, or dietary supplements you use. Also tell them if you smoke, drink alcohol, or use illegal drugs. Some items may interact with your medicine. What should I watch for while using this medication? Your condition will be monitored carefully while you are receiving this medicine. You may need blood work done while you are taking this medicine. Do not become pregnant while taking this medicine or for 4 months after stopping it. Women should inform their doctor if they wish to become pregnant or think they might be pregnant. There is a potential for serious side effects to an unborn child. Talk to your health care professional or  pharmacist for more information. Do not breast-feed an infant while taking this medicine or for 4 months after the last dose. What side effects may I notice from receiving this medication? Side effects that you should report to your doctor or health care professional as soon as possible: allergic reactions like skin rash, itching or hives, swelling of the face, lips, or tongue bloody or black, tarry breathing problems changes in vision chest pain chills confusion constipation cough diarrhea dizziness or feeling faint or lightheaded fast or irregular heartbeat fever flushing joint pain low blood counts - this medicine may decrease the number of white blood cells, red blood cells and platelets. You may be at increased risk for infections and bleeding. muscle pain muscle weakness pain, tingling, numbness in the hands or feet persistent headache redness, blistering, peeling or loosening of the skin, including inside the mouth signs and symptoms of high blood sugar such as dizziness; dry mouth; dry skin; fruity breath; nausea; stomach pain; increased hunger or thirst; increased urination signs and symptoms of kidney injury like trouble passing urine or change in the amount of urine signs and symptoms of liver injury like dark urine, light-colored stools, loss of appetite, nausea, right upper belly pain, yellowing of the eyes or skin sweating swollen lymph nodes weight loss Side effects that usually do not  require medical attention (report to your doctor or health care professional if they continue or are bothersome): decreased appetite hair loss tiredness This list may not describe all possible side effects. Call your doctor for medical advice about side effects. You may report side effects to FDA at 1-800-FDA-1088. Where should I keep my medication? This drug is given in a hospital or clinic and will not be stored at home. NOTE: This sheet is a summary. It may not cover all possible  information. If you have questions about this medicine, talk to your doctor, pharmacist, or health care provider.  2023 Elsevier/Gold Standard (2021-08-13 00:00:00)  Leuprolide depot injection What is this medication? LEUPROLIDE (loo PROE lide) is a man-made protein that acts like a natural hormone in the body. It decreases testosterone in men and decreases estrogen in women. In men, this medicine is used to treat advanced prostate cancer. In women, some forms of this medicine may be used to treat endometriosis, uterine fibroids, or other male hormone-related problems. This medicine may be used for other purposes; ask your health care provider or pharmacist if you have questions. COMMON BRAND NAME(S): Eligard, Fensolv, Lupron Depot, Lupron Depot-Ped, Viadur What should I tell my care team before I take this medication? They need to know if you have any of these conditions: diabetes heart disease or previous heart attack high blood pressure high cholesterol mental illness osteoporosis pain or difficulty passing urine seizures spinal cord metastasis stroke suicidal thoughts, plans, or attempt; a previous suicide attempt by you or a family member tobacco smoker unusual vaginal bleeding (women) an unusual or allergic reaction to leuprolide, benzyl alcohol, other medicines, foods, dyes, or preservatives pregnant or trying to get pregnant breast-feeding How should I use this medication? This medicine is for injection into a muscle or for injection under the skin. It is given by a health care professional in a hospital or clinic setting. The specific product will determine how it will be given to you. Make sure you understand which product you receive and how often you will receive it. Talk to your pediatrician regarding the use of this medicine in children. Special care may be needed. Overdosage: If you think you have taken too much of this medicine contact a poison control center or  emergency room at once. NOTE: This medicine is only for you. Do not share this medicine with others. What if I miss a dose? It is important not to miss a dose. Call your doctor or health care professional if you are unable to keep an appointment. Depot injections: Depot injections are given either once-monthly, every 12 weeks, every 16 weeks, or every 24 weeks depending on the product you are prescribed. The product you are prescribed will be based on if you are male or male, and your condition. Make sure you understand your product and dosing. What may interact with this medication? Do not take this medicine with any of the following medications: chasteberry cisapride dronedarone pimozide thioridazine This medicine may also interact with the following medications: herbal or dietary supplements, like black cohosh or DHEA male hormones, like estrogens or progestins and birth control pills, patches, rings, or injections male hormones, like testosterone other medicines that prolong the QT interval (abnormal heart rhythm) This list may not describe all possible interactions. Give your health care provider a list of all the medicines, herbs, non-prescription drugs, or dietary supplements you use. Also tell them if you smoke, drink alcohol, or use illegal drugs. Some items may interact with  your medicine. What should I watch for while using this medication? Visit your doctor or health care professional for regular checks on your progress. During the first weeks of treatment, your symptoms may get worse, but then will improve as you continue your treatment. You may get hot flashes, increased bone pain, increased difficulty passing urine, or an aggravation of nerve symptoms. Discuss these effects with your doctor or health care professional, some of them may improve with continued use of this medicine. Male patients may experience a menstrual cycle or spotting during the first months of therapy  with this medicine. If this continues, contact your doctor or health care professional. This medicine may increase blood sugar. Ask your healthcare provider if changes in diet or medicines are needed if you have diabetes. What side effects may I notice from receiving this medication? Side effects that you should report to your doctor or health care professional as soon as possible: allergic reactions like skin rash, itching or hives, swelling of the face, lips, or tongue breathing problems chest pain depression or memory disorders pain in your legs or groin pain at site where injected or implanted seizures severe headache signs and symptoms of high blood sugar such as being more thirsty or hungry or having to urinate more than normal. You may also feel very tired or have blurry vision swelling of the feet and legs suicidal thoughts or other mood changes visual changes vomiting Side effects that usually do not require medical attention (report to your doctor or health care professional if they continue or are bothersome): breast swelling or tenderness decrease in sex drive or performance diarrhea hot flashes loss of appetite muscle, joint, or bone pains nausea redness or irritation at site where injected or implanted skin problems or acne This list may not describe all possible side effects. Call your doctor for medical advice about side effects. You may report side effects to FDA at 1-800-FDA-1088. Where should I keep my medication? This drug is given in a hospital or clinic and will not be stored at home. NOTE: This sheet is a summary. It may not cover all possible information. If you have questions about this medicine, talk to your doctor, pharmacist, or health care provider.  2023 Elsevier/Gold Standard (2021-08-13 00:00:00)

## 2022-02-21 LAB — PROSTATE-SPECIFIC AG, SERUM (LABCORP): Prostate Specific Ag, Serum: 0.1 ng/mL (ref 0.0–4.0)

## 2022-02-22 ENCOUNTER — Other Ambulatory Visit (HOSPITAL_COMMUNITY): Payer: Self-pay

## 2022-02-26 ENCOUNTER — Other Ambulatory Visit: Payer: Self-pay | Admitting: Oncology

## 2022-02-26 DIAGNOSIS — C179 Malignant neoplasm of small intestine, unspecified: Secondary | ICD-10-CM

## 2022-02-26 DIAGNOSIS — Z85068 Personal history of other malignant neoplasm of small intestine: Secondary | ICD-10-CM

## 2022-02-26 DIAGNOSIS — C61 Malignant neoplasm of prostate: Secondary | ICD-10-CM

## 2022-03-01 ENCOUNTER — Encounter: Payer: Self-pay | Admitting: Nurse Practitioner

## 2022-03-02 ENCOUNTER — Encounter: Payer: Self-pay | Admitting: Oncology

## 2022-03-03 ENCOUNTER — Other Ambulatory Visit: Payer: Self-pay | Admitting: *Deleted

## 2022-03-03 ENCOUNTER — Encounter: Payer: Self-pay | Admitting: Nurse Practitioner

## 2022-03-03 ENCOUNTER — Other Ambulatory Visit: Payer: Self-pay | Admitting: Nurse Practitioner

## 2022-03-03 ENCOUNTER — Other Ambulatory Visit (HOSPITAL_COMMUNITY): Payer: Self-pay

## 2022-03-03 MED ORDER — AXITINIB 1 MG PO TABS
2.0000 mg | ORAL_TABLET | Freq: Every day | ORAL | 0 refills | Status: DC
  Filled 2022-03-03: qty 60, 30d supply, fill #0

## 2022-03-03 MED ORDER — AXITINIB 1 MG PO TABS
2.0000 mg | ORAL_TABLET | Freq: Every day | ORAL | 0 refills | Status: DC
  Filled 2022-03-03: qty 30, 15d supply, fill #0

## 2022-03-03 NOTE — Telephone Encounter (Signed)
Refill for axitinib 2 mg daily sent to [facility] at direction of MD and Ned Card, NP.

## 2022-03-05 ENCOUNTER — Other Ambulatory Visit: Payer: Self-pay | Admitting: Oncology

## 2022-03-07 ENCOUNTER — Other Ambulatory Visit (HOSPITAL_COMMUNITY): Payer: Self-pay

## 2022-03-11 ENCOUNTER — Encounter: Payer: Self-pay | Admitting: *Deleted

## 2022-03-11 ENCOUNTER — Encounter: Payer: Self-pay | Admitting: Nurse Practitioner

## 2022-03-11 ENCOUNTER — Inpatient Hospital Stay: Payer: Medicare Other | Admitting: Nurse Practitioner

## 2022-03-11 ENCOUNTER — Inpatient Hospital Stay: Payer: Medicare Other | Attending: Oncology

## 2022-03-11 ENCOUNTER — Inpatient Hospital Stay: Payer: Medicare Other

## 2022-03-11 ENCOUNTER — Other Ambulatory Visit: Payer: Self-pay

## 2022-03-11 VITALS — BP 164/84 | HR 62

## 2022-03-11 VITALS — BP 154/85 | HR 78 | Temp 98.1°F | Resp 20 | Ht 65.0 in | Wt 145.2 lb

## 2022-03-11 DIAGNOSIS — C61 Malignant neoplasm of prostate: Secondary | ICD-10-CM

## 2022-03-11 DIAGNOSIS — C641 Malignant neoplasm of right kidney, except renal pelvis: Secondary | ICD-10-CM | POA: Insufficient documentation

## 2022-03-11 DIAGNOSIS — Z79899 Other long term (current) drug therapy: Secondary | ICD-10-CM | POA: Insufficient documentation

## 2022-03-11 DIAGNOSIS — Z5112 Encounter for antineoplastic immunotherapy: Secondary | ICD-10-CM | POA: Insufficient documentation

## 2022-03-11 DIAGNOSIS — C179 Malignant neoplasm of small intestine, unspecified: Secondary | ICD-10-CM

## 2022-03-11 LAB — CBC WITH DIFFERENTIAL (CANCER CENTER ONLY)
Abs Immature Granulocytes: 0.04 10*3/uL (ref 0.00–0.07)
Basophils Absolute: 0 10*3/uL (ref 0.0–0.1)
Basophils Relative: 1 %
Eosinophils Absolute: 0.1 10*3/uL (ref 0.0–0.5)
Eosinophils Relative: 2 %
HCT: 34.1 % — ABNORMAL LOW (ref 39.0–52.0)
Hemoglobin: 10.6 g/dL — ABNORMAL LOW (ref 13.0–17.0)
Immature Granulocytes: 1 %
Lymphocytes Relative: 22 %
Lymphs Abs: 1.5 10*3/uL (ref 0.7–4.0)
MCH: 29.5 pg (ref 26.0–34.0)
MCHC: 31.1 g/dL (ref 30.0–36.0)
MCV: 95 fL (ref 80.0–100.0)
Monocytes Absolute: 0.5 10*3/uL (ref 0.1–1.0)
Monocytes Relative: 8 %
Neutro Abs: 4.4 10*3/uL (ref 1.7–7.7)
Neutrophils Relative %: 66 %
Platelet Count: 212 10*3/uL (ref 150–400)
RBC: 3.59 MIL/uL — ABNORMAL LOW (ref 4.22–5.81)
RDW: 13.6 % (ref 11.5–15.5)
WBC Count: 6.6 10*3/uL (ref 4.0–10.5)
nRBC: 0 % (ref 0.0–0.2)

## 2022-03-11 LAB — CMP (CANCER CENTER ONLY)
ALT: 14 U/L (ref 0–44)
AST: 14 U/L — ABNORMAL LOW (ref 15–41)
Albumin: 4.3 g/dL (ref 3.5–5.0)
Alkaline Phosphatase: 55 U/L (ref 38–126)
Anion gap: 10 (ref 5–15)
BUN: 29 mg/dL — ABNORMAL HIGH (ref 8–23)
CO2: 23 mmol/L (ref 22–32)
Calcium: 9.9 mg/dL (ref 8.9–10.3)
Chloride: 108 mmol/L (ref 98–111)
Creatinine: 1.76 mg/dL — ABNORMAL HIGH (ref 0.61–1.24)
GFR, Estimated: 43 mL/min — ABNORMAL LOW (ref >60)
Glucose, Bld: 128 mg/dL — ABNORMAL HIGH (ref 70–99)
Potassium: 3.4 mmol/L — ABNORMAL LOW (ref 3.5–5.1)
Sodium: 141 mmol/L (ref 135–145)
Total Bilirubin: 0.4 mg/dL (ref 0.3–1.2)
Total Protein: 7.1 g/dL (ref 6.5–8.1)

## 2022-03-11 LAB — TSH: TSH: 2.254 u[IU]/mL (ref 0.350–4.500)

## 2022-03-11 MED ORDER — SODIUM CHLORIDE 0.9 % IV SOLN
200.0000 mg | Freq: Once | INTRAVENOUS | Status: AC
  Administered 2022-03-11: 200 mg via INTRAVENOUS
  Filled 2022-03-11: qty 8

## 2022-03-11 MED ORDER — SODIUM CHLORIDE 0.9 % IV SOLN: Freq: Once | INTRAVENOUS | Status: AC

## 2022-03-11 NOTE — Progress Notes (Signed)
Patient seen by Ned Card NP today  Vitals are within treatment parameters.  Labs reviewed by Ned Card NP and are not all within treatment parameters. Serum creatinine 1.76 --OK to treat  Per physician team, patient is ready for treatment and there are NO modifications to the treatment plan.  He is not due for Lupron today.

## 2022-03-11 NOTE — Patient Instructions (Signed)
[facility]  Discharge Instructions: Thank you for choosing [facility] to provide your oncology and hematology care.   If you have a lab appointment with the Cancer Center, please go directly to the Cancer Center and check in at the registration area.   Wear comfortable clothing and clothing appropriate for easy access to any Portacath or PICC line.   We strive to give you quality time with your provider. You may need to reschedule your appointment if you arrive late (15 or more minutes).  Arriving late affects you and other patients whose appointments are after yours.  Also, if you miss three or more appointments without notifying the office, you may be dismissed from the clinic at the provider's discretion.      For prescription refill requests, have your pharmacy contact our office and allow 72 hours for refills to be completed.    Today you received the following chemotherapy and/or immunotherapy agents Keytruda      To help prevent nausea and vomiting after your treatment, we encourage you to take your nausea medication as directed.  BELOW ARE SYMPTOMS THAT SHOULD BE REPORTED IMMEDIATELY: *FEVER GREATER THAN 100.4 F (38 C) OR HIGHER *CHILLS OR SWEATING *NAUSEA AND VOMITING THAT IS NOT CONTROLLED WITH YOUR NAUSEA MEDICATION *UNUSUAL SHORTNESS OF BREATH *UNUSUAL BRUISING OR BLEEDING *URINARY PROBLEMS (pain or burning when urinating, or frequent urination) *BOWEL PROBLEMS (unusual diarrhea, constipation, pain near the anus) TENDERNESS IN MOUTH AND THROAT WITH OR WITHOUT PRESENCE OF ULCERS (sore throat, sores in mouth, or a toothache) UNUSUAL RASH, SWELLING OR PAIN  UNUSUAL VAGINAL DISCHARGE OR ITCHING   Items with * indicate a potential emergency and should be followed up as soon as possible or go to the Emergency Department if any problems should occur.  Please show the CHEMOTHERAPY ALERT CARD or IMMUNOTHERAPY ALERT CARD at check-in to the  Emergency Department and triage nurse.  Should you have questions after your visit or need to cancel or reschedule your appointment, please contact [facility]  Dept: [phone]  and follow the prompts.  Office hours are 8:00 a.m. to 4:30 p.m. Monday - Friday. Please note that voicemails left after 4:00 p.m. may not be returned until the following business day.  We are closed weekends and major holidays. You have access to a nurse at all times for urgent questions. Please call the main number to the clinic Dept: [phone] and follow the prompts.   For any non-urgent questions, you may also contact your provider using MyChart. We now offer e-Visits for anyone 18 and older to request care online for non-urgent symptoms. For details visit [email or web address].   Also download the MyChart app! Go to the app store, search "MyChart", open the app, select [facility], and log in with your MyChart username and password.  Masks are optional in the cancer centers. If you would like for your care team to wear a mask while they are taking care of you, please let them know. For doctor visits, patients may have with them one support person who is at least 65 years old. At this time, visitors are not allowed in the infusion area.   

## 2022-03-11 NOTE — Progress Notes (Signed)
[facility] OFFICE PROGRESS NOTE   Diagnosis: Renal cell carcinoma, prostate cancer  INTERVAL HISTORY:   Brandon Peters returns as scheduled.  He continues axitinib 2 mg daily.  He completed another cycle of Pembrolizumab 02/18/2022.  He saw Dr. Edd Arbour at [facility] 03/03/2022.  He was prescribed Eliquis 5 mg twice daily due to the thrombus identified on recent imaging.  Recommendation to continue Pembrolizumab/axitinib.  Repeat CT planned in 6 weeks.  He overall is feeling well.  He denies nausea/vomiting.  No mouth sores.  No diarrhea.  No rash.  He denies pain.  No bleeding.  He reports hands and feet are "okay".  Objective:  Vital signs in last 24 hours:  Blood pressure (!) 154/85, pulse 78, temperature 98.1 F (36.7 C), temperature source Oral, resp. rate 20, height '5\' 5"'  (1.651 m), weight 145 lb 3.2 oz (65.9 kg), SpO2 100 %.    HEENT: No thrush or ulcers. Resp: Lungs clear bilaterally. Cardio: Regular rate and rhythm. GI: No hepatosplenomegaly. Vascular: No leg edema. Skin: Palms and soles with hyperpigmentation, skin thickening.  Right palm has a chronically dry appearance.   Lab Results:  Lab Results  Component Value Date   WBC 6.6 03/11/2022   HGB 10.6 (L) 03/11/2022   HCT 34.1 (L) 03/11/2022   MCV 95.0 03/11/2022   PLT 212 03/11/2022   NEUTROABS 4.4 03/11/2022    Imaging:  No results found.  Medications: I have reviewed the patient's current medications.  Assessment/Plan: History of Severe anemia-status post a red cell transfusion 03/04/2016 2.. Prostate cancer Extensive sclerotic bone metastases noted on a CT of the abdomen/pelvis 03/04/2016 and MRI abdomen 03/05/2016 Markedly elevated PSA Lupron initiated 03/15/2016; Casodex 14 days beginning 03/15/2016 Prostate biopsy 03/18/2016 05/09/2016 PSA significantly improved at 21 Abiraterone initiated 05/10/2016 PSA less than 0.1 07/10/2018 PSA less than 0.1 08/30/2018 PSA less than 0.1 10/18/2018 PSA less  than 0.1 11/29/2018 PSA less than 0.1 on 11/06/2019  PSA less than 0.1 on 03/05/2020 PSA less than 0.1 on 07/06/2020 PSA less than 0.1 08/17/2021 PSA less than 0.1 on 09/30/2021 PSA 0.1 on 11/12/2021 PSA 0.1 on 02/18/2022   3.   Symptoms of obstructive uropathy. Improved   4.   Distal duodenal and descending colon masses biopsy of the duodenal mass 03/04/2016 confirmed fragments of small bowel mucosa with focal high-grade dysplasia Biopsy of the descending colon "polyps" revealed fragments of a tubulovillous adenoma CT 08/29/2017-similar soft tissue thickening in the region of the splenic flexure of the colon suboptimally evaluated due to lack of colonic enteric contrast Referred to Dr. Benson Norway Referred to [facility] Resection of descending colon polyps 01/31/2018, tubulovillous adenomas with high-grade dysplasia Attempted endoscopic resection of the fourth segment duodenal "polyp" on 05/09/2018, firm unresectable lesion, biopsy revealed moderately differentiated adenocarcinoma CT abdomen/pelvis 05/25/2018- heterogeneously enhancing right lower pole renal lesion; diffuse sclerotic osseous metastatic lesions compatible with history of prostate cancer CT chest 05/25/2018- no pulmonary metastasis.  Diffuse osseous metastasis involving the axial and appendicular skeleton. 06/19/2018-sleeve resection of third and fourth portions of duodenum with primary end-to-end anastomosis, feeding gastrojejunostomy tube placement, Dr. Cloyd Stagers; poorly differentiated adenocarcinoma duodenum, 3 cm; tumor invades the muscularis propria; lymphovascular invasion present; negative margins; 4 of 11 involved lymph nodes; soft tissue deposits also seen in the mesentery; all mucosal margins and soft tissue margins negative but 2 of the positive nodes are found in the soft tissue margin submitted for frozen section (pT2 pN2); second mucosal lesion 1.2 cm from the proximal margin, tubular  adenoma with focal severe dysplasia Intact expression of  mismatch repair proteins by IHC; microsatellite stable Cycle 1 adjuvant Xeloda 07/23/2018 Cycle 2 adjuvant Xeloda 08/13/2018 Cycle 3 adjuvant Xeloda 09/10/2018 (dose reduced to 1000 mg twice daily for 14 days)  Cycle 4 adjuvant Xeloda 10/01/2018 (dose increased to 1500 mg every morning and 1000 mg every afternoon for 14 days) Cycle 5 adjuvant Xeloda 10/22/2018 Cycle 6 adjuvant Xeloda 11/12/2018 Cycle 7 adjuvant Xeloda 12/03/2018 Cycle 8 adjuvant Xeloda 12/24/2018   5.  Anorexia/weight loss- resolved   6.  Undescended testicles   7.  Right renal mass.  CT 08/29/2017-right renal mass involving the lower pole was partially calcified and does demonstrate enhancement following contrast most consistent with renal cell carcinoma.  Lesion not significantly enlarged compared with the prior CT. Followed by urology. 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 to the IVC/RA junction, upper normal size retrocaval node 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, nuclear grade 3 Axitinib/pembrolizumab 10/22/2021 Axitinib placed on hold 11/05/2021 due to hand-foot syndrome Axitinib resumed at a dose of 5 mg daily 11/12/2021, pembrolizumab 11/12/2021 Axitinib discontinued 12/03/2021 secondary to progressive hand-foot syndrome, pembrolizumab 12/03/2021 Axitinib resumed at a reduced dose of 2 mg twice daily after office visit 12/14/2021 Pembrolizumab 12/24/2021 Axitinib stopped 01/03/2022  secondary to persistent hand/foot symptoms CT chest at [facility] 01/13/2022-no evidence of thoracic metastatic disease, similar diffuse sclerotic change in the skeleton MRI abdomen at [facility] 01/13/2022-similar size of right lower pole renal mass with renal vein thrombosis, decreased overall extent of thrombus now extending to the intrahepatic IVC, enhancement of right renal mass and thrombus is decreased, new left hydroureter ureter nephrosis with asymmetric perinephric stranding Pembrolizumab 01/27/2022 Axitinib resumed 2 mg daily 01/28/2022 CT abdomen/pelvis at [facility] 02/10/2022-overall reduced size of right lower pole renal mass which demonstrates persistent region of enhancement along the posterior and lateral margin with extension into the renal sinus.  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  Query additional IVC thrombus at the intrahepatic IVC and extending into the right atrium. Axitinib continued 2 mg daily, Pembrolizumab every 3 weeks   8.  Diabetes   9.  Anemia-stool Hemoccults + March 2021, referred to Dr. Benson Norway for endoscopic evaluation, anemia also secondary to metastatic prostate cancer, hypogonadism Small bowel enteroscopy 02/11/2020-esophagus normal.  Hematin found in the gastric body.  Examined duodenum normal.  No evidence of significant pathology in the entire examined portion of the jejunum.  There was no overt source of bleeding or inflammation.  The bleeding may be from small AVMs.  Surgical anastomosis was not visible with repeated inspection of the duodenum. Colonoscopy 02/11/2020-multiple sessile and semipedunculated polyps were found in the rectum, descending colon and ascending colon, 3 to 18 mm in size.  Polyps removed (polypectomy descending colon-fragments of tubular adenoma, inflammatory type polyp; polypectomy ascending colon-tubular adenoma, no high-grade dysplasia or malignancy; polypectomy descending colon-fragments of tubulovillous adenoma, no high-grade  dysplasia or malignancy; polypectomy colon, rectum-tubular adenoma, no high-grade dysplasia or malignancy). Colonoscopy 02/02/2021-seven 2 to 10 mm polyps in the sigmoid  colon (tubular adenoma), descending colon (fragments of tubulovillous adenoma, fragments of tubular adenoma ) and transverse colon (fragments of tubular adenoma). Progressive anemia 07/05/2021; stool cards positive Upper endoscopy/colonoscopy 09/23/2021-multiple polyps removed from the colon-tubular adenomas, diffuse angioectasias in the stomach with bleeding-not amenable to endoscopic treatment 10.  Renal failure-progressive 01/17/2022 11.  CT chest at [facility] 01/13/2022-no evidence of thoracic metastatic disease, similar diffuse sclerotic changes in the skeleton 12.  CT renal stone study 01/14/2022-mild left side hydroureteronephrosis secondary to a left UVJ stone, left nephrolithiasis, new right middle lobe collapse/consolidation, edema/fluid at the left iliopsoas muscle  Disposition: Mr. Julian appears stable.  He continues axitinib and pembrolizumab.  He is scheduled for Pembrolizumab today.  CBC and chemistry panel reviewed.  Labs adequate to proceed as above.  Creatinine is stable.  He will return for lab, follow-up, Pembrolizumab in 3 weeks.  We are available to see him sooner if needed.    Ned Card ANP/GNP-BC   03/11/2022  9:18 AM

## 2022-03-18 ENCOUNTER — Other Ambulatory Visit (HOSPITAL_COMMUNITY): Payer: Self-pay

## 2022-03-22 ENCOUNTER — Other Ambulatory Visit (HOSPITAL_COMMUNITY): Payer: Self-pay

## 2022-03-26 ENCOUNTER — Other Ambulatory Visit: Payer: Self-pay | Admitting: Oncology

## 2022-04-01 ENCOUNTER — Other Ambulatory Visit (HOSPITAL_COMMUNITY): Payer: Self-pay

## 2022-04-01 ENCOUNTER — Inpatient Hospital Stay: Payer: Medicare Other | Admitting: Oncology

## 2022-04-01 ENCOUNTER — Encounter: Payer: Self-pay | Admitting: *Deleted

## 2022-04-01 ENCOUNTER — Inpatient Hospital Stay (HOSPITAL_BASED_OUTPATIENT_CLINIC_OR_DEPARTMENT_OTHER): Payer: Medicare Other | Admitting: Nurse Practitioner

## 2022-04-01 ENCOUNTER — Inpatient Hospital Stay: Payer: Medicare Other

## 2022-04-01 ENCOUNTER — Other Ambulatory Visit: Payer: Self-pay | Admitting: *Deleted

## 2022-04-01 ENCOUNTER — Encounter: Payer: Self-pay | Admitting: Nurse Practitioner

## 2022-04-01 ENCOUNTER — Inpatient Hospital Stay: Payer: Medicare Other | Attending: Oncology

## 2022-04-01 VITALS — BP 146/83 | HR 84 | Temp 98.2°F | Resp 18 | Ht 65.0 in | Wt 146.6 lb

## 2022-04-01 VITALS — BP 143/73 | HR 66 | Temp 98.2°F | Resp 16

## 2022-04-01 DIAGNOSIS — Z79899 Other long term (current) drug therapy: Secondary | ICD-10-CM | POA: Diagnosis not present

## 2022-04-01 DIAGNOSIS — C61 Malignant neoplasm of prostate: Secondary | ICD-10-CM

## 2022-04-01 DIAGNOSIS — C641 Malignant neoplasm of right kidney, except renal pelvis: Secondary | ICD-10-CM | POA: Diagnosis present

## 2022-04-01 DIAGNOSIS — C7951 Secondary malignant neoplasm of bone: Secondary | ICD-10-CM | POA: Diagnosis present

## 2022-04-01 DIAGNOSIS — Z5112 Encounter for antineoplastic immunotherapy: Secondary | ICD-10-CM | POA: Diagnosis present

## 2022-04-01 LAB — CMP (CANCER CENTER ONLY)
ALT: 14 U/L (ref 0–44)
AST: 21 U/L (ref 15–41)
Albumin: 4.3 g/dL (ref 3.5–5.0)
Alkaline Phosphatase: 58 U/L (ref 38–126)
Anion gap: 11 (ref 5–15)
BUN: 35 mg/dL — ABNORMAL HIGH (ref 8–23)
CO2: 23 mmol/L (ref 22–32)
Calcium: 9.9 mg/dL (ref 8.9–10.3)
Chloride: 111 mmol/L (ref 98–111)
Creatinine: 1.98 mg/dL — ABNORMAL HIGH (ref 0.61–1.24)
GFR, Estimated: 37 mL/min — ABNORMAL LOW (ref >60)
Glucose, Bld: 124 mg/dL — ABNORMAL HIGH (ref 70–99)
Potassium: 3.8 mmol/L (ref 3.5–5.1)
Sodium: 145 mmol/L (ref 135–145)
Total Bilirubin: 0.4 mg/dL (ref 0.3–1.2)
Total Protein: 7.1 g/dL (ref 6.5–8.1)

## 2022-04-01 LAB — CBC WITH DIFFERENTIAL (CANCER CENTER ONLY)
Abs Immature Granulocytes: 0.02 10*3/uL (ref 0.00–0.07)
Basophils Absolute: 0 10*3/uL (ref 0.0–0.1)
Basophils Relative: 0 %
Eosinophils Absolute: 0.1 10*3/uL (ref 0.0–0.5)
Eosinophils Relative: 1 %
HCT: 35.7 % — ABNORMAL LOW (ref 39.0–52.0)
Hemoglobin: 10.9 g/dL — ABNORMAL LOW (ref 13.0–17.0)
Immature Granulocytes: 0 %
Lymphocytes Relative: 24 %
Lymphs Abs: 1.7 10*3/uL (ref 0.7–4.0)
MCH: 29.5 pg (ref 26.0–34.0)
MCHC: 30.5 g/dL (ref 30.0–36.0)
MCV: 96.5 fL (ref 80.0–100.0)
Monocytes Absolute: 0.6 10*3/uL (ref 0.1–1.0)
Monocytes Relative: 8 %
Neutro Abs: 4.6 10*3/uL (ref 1.7–7.7)
Neutrophils Relative %: 67 %
Platelet Count: 167 10*3/uL (ref 150–400)
RBC: 3.7 MIL/uL — ABNORMAL LOW (ref 4.22–5.81)
RDW: 14.1 % (ref 11.5–15.5)
WBC Count: 7 10*3/uL (ref 4.0–10.5)
nRBC: 0 % (ref 0.0–0.2)

## 2022-04-01 LAB — TOTAL PROTEIN, URINE DIPSTICK: Protein, ur: 30 mg/dL — AB

## 2022-04-01 MED ORDER — SODIUM CHLORIDE 0.9 % IV SOLN
200.0000 mg | Freq: Once | INTRAVENOUS | Status: AC
  Administered 2022-04-01: 200 mg via INTRAVENOUS
  Filled 2022-04-01: qty 8

## 2022-04-01 MED ORDER — SODIUM CHLORIDE 0.9 % IV SOLN: Freq: Once | INTRAVENOUS | Status: AC

## 2022-04-01 MED ORDER — AXITINIB 1 MG PO TABS
2.0000 mg | ORAL_TABLET | Freq: Every day | ORAL | 0 refills | Status: DC
  Filled 2022-04-01: qty 60, 30d supply, fill #0

## 2022-04-01 NOTE — Patient Instructions (Signed)
[facility]   Discharge Instructions: Thank you for choosing [facility] to provide your oncology and hematology care.   If you have a lab appointment with the Cancer Center, please go directly to the Cancer Center and check in at the registration area.   Wear comfortable clothing and clothing appropriate for easy access to any Portacath or PICC line.   We strive to give you quality time with your provider. You may need to reschedule your appointment if you arrive late (15 or more minutes).  Arriving late affects you and other patients whose appointments are after yours.  Also, if you miss three or more appointments without notifying the office, you may be dismissed from the clinic at the provider's discretion.      For prescription refill requests, have your pharmacy contact our office and allow 72 hours for refills to be completed.    Today you received the following chemotherapy and/or immunotherapy agents Keytruda      To help prevent nausea and vomiting after your treatment, we encourage you to take your nausea medication as directed.  BELOW ARE SYMPTOMS THAT SHOULD BE REPORTED IMMEDIATELY: *FEVER GREATER THAN 100.4 F (38 C) OR HIGHER *CHILLS OR SWEATING *NAUSEA AND VOMITING THAT IS NOT CONTROLLED WITH YOUR NAUSEA MEDICATION *UNUSUAL SHORTNESS OF BREATH *UNUSUAL BRUISING OR BLEEDING *URINARY PROBLEMS (pain or burning when urinating, or frequent urination) *BOWEL PROBLEMS (unusual diarrhea, constipation, pain near the anus) TENDERNESS IN MOUTH AND THROAT WITH OR WITHOUT PRESENCE OF ULCERS (sore throat, sores in mouth, or a toothache) UNUSUAL RASH, SWELLING OR PAIN  UNUSUAL VAGINAL DISCHARGE OR ITCHING   Items with * indicate a potential emergency and should be followed up as soon as possible or go to the Emergency Department if any problems should occur.  Please show the CHEMOTHERAPY ALERT CARD or IMMUNOTHERAPY ALERT CARD at check-in to the  Emergency Department and triage nurse.  Should you have questions after your visit or need to cancel or reschedule your appointment, please contact [facility]  Dept: [phone]  and follow the prompts.  Office hours are 8:00 a.m. to 4:30 p.m. Monday - Friday. Please note that voicemails left after 4:00 p.m. may not be returned until the following business day.  We are closed weekends and major holidays. You have access to a nurse at all times for urgent questions. Please call the main number to the clinic Dept: [phone] and follow the prompts.   For any non-urgent questions, you may also contact your provider using MyChart. We now offer e-Visits for anyone 18 and older to request care online for non-urgent symptoms. For details visit [email or web address].   Also download the MyChart app! Go to the app store, search "MyChart", open the app, select [facility], and log in with your MyChart username and password.  Masks are optional in the cancer centers. If you would like for your care team to wear a mask while they are taking care of you, please let them know. For doctor visits, patients may have with them one support person who is at least 65 years old. At this time, visitors are not allowed in the infusion area.  Pembrolizumab injection What is this medication? PEMBROLIZUMAB (pem broe liz ue mab) is a monoclonal antibody. It is used to treat certain types of cancer. This medicine may be used for other purposes; ask your health care provider or pharmacist if you have questions. COMMON BRAND NAME(S): Keytruda What   should I tell my care team before I take this medication? They need to know if you have any of these conditions: autoimmune diseases like Crohn's disease, ulcerative colitis, or lupus have had or planning to have an allogeneic stem cell transplant (uses someone else's stem cells) history of organ transplant history of chest radiation nervous system  problems like myasthenia gravis or Guillain-Barre syndrome an unusual or allergic reaction to pembrolizumab, other medicines, foods, dyes, or preservatives pregnant or trying to get pregnant breast-feeding How should I use this medication? This medicine is for infusion into a vein. It is given by a health care professional in a hospital or clinic setting. A special MedGuide will be given to you before each treatment. Be sure to read this information carefully each time. Talk to your pediatrician regarding the use of this medicine in children. While this drug may be prescribed for children as young as 6 months for selected conditions, precautions do apply. Overdosage: If you think you have taken too much of this medicine contact a poison control center or emergency room at once. NOTE: This medicine is only for you. Do not share this medicine with others. What if I miss a dose? It is important not to miss your dose. Call your doctor or health care professional if you are unable to keep an appointment. What may interact with this medication? Interactions have not been studied. This list may not describe all possible interactions. Give your health care provider a list of all the medicines, herbs, non-prescription drugs, or dietary supplements you use. Also tell them if you smoke, drink alcohol, or use illegal drugs. Some items may interact with your medicine. What should I watch for while using this medication? Your condition will be monitored carefully while you are receiving this medicine. You may need blood work done while you are taking this medicine. Do not become pregnant while taking this medicine or for 4 months after stopping it. Women should inform their doctor if they wish to become pregnant or think they might be pregnant. There is a potential for serious side effects to an unborn child. Talk to your health care professional or pharmacist for more information. Do not breast-feed an infant  while taking this medicine or for 4 months after the last dose. What side effects may I notice from receiving this medication? Side effects that you should report to your doctor or health care professional as soon as possible: allergic reactions like skin rash, itching or hives, swelling of the face, lips, or tongue bloody or black, tarry breathing problems changes in vision chest pain chills confusion constipation cough diarrhea dizziness or feeling faint or lightheaded fast or irregular heartbeat fever flushing joint pain low blood counts - this medicine may decrease the number of white blood cells, red blood cells and platelets. You may be at increased risk for infections and bleeding. muscle pain muscle weakness pain, tingling, numbness in the hands or feet persistent headache redness, blistering, peeling or loosening of the skin, including inside the mouth signs and symptoms of high blood sugar such as dizziness; dry mouth; dry skin; fruity breath; nausea; stomach pain; increased hunger or thirst; increased urination signs and symptoms of kidney injury like trouble passing urine or change in the amount of urine signs and symptoms of liver injury like dark urine, light-colored stools, loss of appetite, nausea, right upper belly pain, yellowing of the eyes or skin sweating swollen lymph nodes weight loss Side effects that usually do not require medical   attention (report to your doctor or health care professional if they continue or are bothersome): decreased appetite hair loss tiredness This list may not describe all possible side effects. Call your doctor for medical advice about side effects. You may report side effects to FDA at 1-800-FDA-1088. Where should I keep my medication? This drug is given in a hospital or clinic and will not be stored at home. NOTE: This sheet is a summary. It may not cover all possible information. If you have questions about this medicine, talk  to your doctor, pharmacist, or health care provider.  2023 Elsevier/Gold Standard (2021-08-13 00:00:00)  

## 2022-04-01 NOTE — Progress Notes (Signed)
Patient seen by Ned Card NP today  Vitals are within treatment parameters.  Labs reviewed by Ned Card NP and are not all within treatment parameters. Creatinine 1.98--OK to treat  Per physician team, patient is ready for treatment and there are NO modifications to the treatment plan.

## 2022-04-01 NOTE — Progress Notes (Signed)
[place] OFFICE PROGRESS NOTE   Diagnosis: Renal cell carcinoma, prostate cancer  INTERVAL HISTORY:   Brandon Peters returns as scheduled.  He continues axitinib 2 mg daily.  He completed another cycle of Pembrolizumab 03/11/2022.  No rash or diarrhea.  No nausea or vomiting.  No hand or foot pain.  He denies bleeding.  Objective:  Vital signs in last 24 hours:  Blood pressure (!) 146/83, pulse 84, temperature 98.2 F (36.8 C), temperature source Oral, resp. rate 18, height '5\' 5"'  (1.651 m), weight 146 lb 9.6 oz (66.5 kg), SpO2 99 %.    HEENT: No thrush or ulcers. Resp: Lungs clear bilaterally. Cardio: Regular rate and rhythm. GI: Abdomen soft and nontender.  No hepatosplenomegaly. Vascular: No leg edema. Skin: Palms and soles with skin thickening, hyperpigmentation, mild dryness.   Lab Results:  Lab Results  Component Value Date   WBC 7.0 04/01/2022   HGB 10.9 (L) 04/01/2022   HCT 35.7 (L) 04/01/2022   MCV 96.5 04/01/2022   PLT 167 04/01/2022   NEUTROABS 4.6 04/01/2022    Imaging:  No results found.  Medications: I have reviewed the patient's current medications.  Assessment/Plan: History of Severe anemia-status post a red cell transfusion 03/04/2016 2.. Prostate cancer Extensive sclerotic bone metastases noted on a CT of the abdomen/pelvis 03/04/2016 and MRI abdomen 03/05/2016 Markedly elevated PSA Lupron initiated 03/15/2016; Casodex 14 days beginning 03/15/2016 Prostate biopsy 03/18/2016 05/09/2016 PSA significantly improved at 21 Abiraterone initiated 05/10/2016 PSA less than 0.1 07/10/2018 PSA less than 0.1 08/30/2018 PSA less than 0.1 10/18/2018 PSA less than 0.1 11/29/2018 PSA less than 0.1 on 11/06/2019  PSA less than 0.1 on 03/05/2020 PSA less than 0.1 on 07/06/2020 PSA less than 0.1 08/17/2021 PSA less than 0.1 on 09/30/2021 PSA 0.1 on 11/12/2021 PSA 0.1 on 02/18/2022   3.   Symptoms of obstructive uropathy. Improved   4.   Distal  duodenal and descending colon masses biopsy of the duodenal mass 03/04/2016 confirmed fragments of small bowel mucosa with focal high-grade dysplasia Biopsy of the descending colon "polyps" revealed fragments of a tubulovillous adenoma CT 08/29/2017-similar soft tissue thickening in the region of the splenic flexure of the colon suboptimally evaluated due to lack of colonic enteric contrast Referred to Dr. Benson Norway Referred to [facility] Resection of descending colon polyps 01/31/2018, tubulovillous adenomas with high-grade dysplasia Attempted endoscopic resection of the fourth segment duodenal "polyp" on 05/09/2018, firm unresectable lesion, biopsy revealed moderately differentiated adenocarcinoma CT abdomen/pelvis 05/25/2018- heterogeneously enhancing right lower pole renal lesion; diffuse sclerotic osseous metastatic lesions compatible with history of prostate cancer CT chest 05/25/2018- no pulmonary metastasis.  Diffuse osseous metastasis involving the axial and appendicular skeleton. 06/19/2018-sleeve resection of third and fourth portions of duodenum with primary end-to-end anastomosis, feeding gastrojejunostomy tube placement, Dr. Cloyd Stagers; poorly differentiated adenocarcinoma duodenum, 3 cm; tumor invades the muscularis propria; lymphovascular invasion present; negative margins; 4 of 11 involved lymph nodes; soft tissue deposits also seen in the mesentery; all mucosal margins and soft tissue margins negative but 2 of the positive nodes are found in the soft tissue margin submitted for frozen section (pT2 pN2); second mucosal lesion 1.2 cm from the proximal margin, tubular adenoma with focal severe dysplasia Intact expression of mismatch repair proteins by IHC; microsatellite stable Cycle 1 adjuvant Xeloda 07/23/2018 Cycle 2 adjuvant Xeloda 08/13/2018 Cycle 3 adjuvant Xeloda 09/10/2018 (dose reduced to 1000 mg twice daily for 14 days)  Cycle 4 adjuvant Xeloda 10/01/2018 (dose increased to 1500 mg every morning  and  1000 mg every afternoon for 14 days) Cycle 5 adjuvant Xeloda 10/22/2018 Cycle 6 adjuvant Xeloda 11/12/2018 Cycle 7 adjuvant Xeloda 12/03/2018 Cycle 8 adjuvant Xeloda 12/24/2018   5.  Anorexia/weight loss- resolved   6.  Undescended testicles   7.  Right renal mass.  CT 08/29/2017-right renal mass involving the lower pole was partially calcified and does demonstrate enhancement following contrast most consistent with renal cell carcinoma.  Lesion not significantly enlarged compared with the prior CT. Followed by urology. 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 to the IVC/RA junction, upper normal size retrocaval node 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, nuclear grade 3 Axitinib/pembrolizumab 10/22/2021 Axitinib placed on hold 11/05/2021 due to hand-foot syndrome Axitinib resumed at a dose of 5 mg daily 11/12/2021, pembrolizumab 11/12/2021 Axitinib discontinued 12/03/2021 secondary to progressive hand-foot syndrome, pembrolizumab 12/03/2021 Axitinib resumed at a reduced dose of 2 mg twice daily after office visit 12/14/2021 Pembrolizumab 12/24/2021 Axitinib stopped 01/03/2022 secondary to persistent hand/foot symptoms CT chest at [facility] 01/13/2022-no evidence of thoracic metastatic disease, similar diffuse sclerotic change in the skeleton MRI abdomen at [facility] 01/13/2022-similar size of right lower pole renal mass with renal vein thrombosis, decreased overall extent of  thrombus now extending to the intrahepatic IVC, enhancement of right renal mass and thrombus is decreased, new left hydroureter ureter nephrosis with asymmetric perinephric stranding Pembrolizumab 01/27/2022 Axitinib resumed 2 mg daily 01/28/2022 CT abdomen/pelvis at [facility] 02/10/2022-overall reduced size of right lower pole renal mass which demonstrates persistent region of enhancement along the posterior and lateral margin with extension into the renal sinus.  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  Query additional IVC thrombus at the intrahepatic IVC and extending into the right atrium. Axitinib continued 2 mg daily, Pembrolizumab every 3 weeks   8.  Diabetes   9.  Anemia-stool Hemoccults + March 2021, referred to Dr. Benson Norway for endoscopic evaluation, anemia also secondary to metastatic prostate cancer, hypogonadism Small bowel enteroscopy 02/11/2020-esophagus normal.  Hematin found in the gastric body.  Examined duodenum normal.  No evidence of significant pathology in the entire examined portion of the jejunum.  There was no overt source of bleeding or inflammation.  The bleeding may be from small AVMs.  Surgical anastomosis was not visible with repeated inspection of the duodenum. Colonoscopy 02/11/2020-multiple sessile and semipedunculated polyps were found in the rectum, descending colon and ascending colon, 3 to 18 mm in size.  Polyps removed (polypectomy descending colon-fragments of tubular adenoma, inflammatory type polyp; polypectomy ascending colon-tubular adenoma, no high-grade dysplasia or malignancy; polypectomy descending colon-fragments of tubulovillous adenoma, no high-grade dysplasia or malignancy; polypectomy colon, rectum-tubular adenoma, no high-grade dysplasia or malignancy). Colonoscopy 02/02/2021-seven 2 to 10 mm polyps in the sigmoid colon (tubular adenoma), descending colon (fragments of tubulovillous adenoma, fragments of tubular adenoma ) and transverse  colon (fragments of tubular adenoma). Progressive anemia 07/05/2021; stool cards positive Upper endoscopy/colonoscopy 09/23/2021-multiple polyps removed from the colon-tubular adenomas, diffuse angioectasias in the stomach with bleeding-not amenable to endoscopic treatment 10.  Renal failure-progressive 01/17/2022 11.  CT chest at [facility] 01/13/2022-no evidence of thoracic metastatic disease, similar diffuse sclerotic changes in the skeleton 12.  CT renal stone study 01/14/2022-mild left side hydroureteronephrosis secondary to a left UVJ stone, left nephrolithiasis, new right middle lobe collapse/consolidation, edema/fluid at the left iliopsoas muscle    Disposition: Brandon Peters appears stable.  Plan to continue axitinib and Pembrolizumab.  He is scheduled for Pembrolizumab today.  CBC and chemistry panel reviewed.  Labs adequate to proceed with treatment.  He will return for follow-up in 3 weeks.  We are available to see him sooner if needed.    Ned Card ANP/GNP-BC   04/01/2022  10:09 AM

## 2022-04-02 ENCOUNTER — Other Ambulatory Visit (HOSPITAL_COMMUNITY): Payer: Self-pay

## 2022-04-13 ENCOUNTER — Other Ambulatory Visit (HOSPITAL_COMMUNITY): Payer: Self-pay

## 2022-04-15 ENCOUNTER — Other Ambulatory Visit (HOSPITAL_COMMUNITY): Payer: Self-pay

## 2022-04-16 ENCOUNTER — Other Ambulatory Visit: Payer: Self-pay | Admitting: Oncology

## 2022-04-18 ENCOUNTER — Other Ambulatory Visit: Payer: Self-pay

## 2022-04-22 ENCOUNTER — Encounter: Payer: Self-pay | Admitting: *Deleted

## 2022-04-22 ENCOUNTER — Inpatient Hospital Stay: Payer: Medicare Other

## 2022-04-22 ENCOUNTER — Inpatient Hospital Stay: Payer: Medicare Other | Admitting: Oncology

## 2022-04-22 VITALS — BP 126/75 | HR 54

## 2022-04-22 VITALS — BP 146/76 | HR 100 | Temp 98.2°F | Resp 20 | Ht 65.0 in | Wt 147.0 lb

## 2022-04-22 DIAGNOSIS — Z5112 Encounter for antineoplastic immunotherapy: Secondary | ICD-10-CM | POA: Diagnosis not present

## 2022-04-22 DIAGNOSIS — C641 Malignant neoplasm of right kidney, except renal pelvis: Secondary | ICD-10-CM

## 2022-04-22 DIAGNOSIS — C61 Malignant neoplasm of prostate: Secondary | ICD-10-CM

## 2022-04-22 LAB — CBC WITH DIFFERENTIAL (CANCER CENTER ONLY)
Abs Immature Granulocytes: 0.01 10*3/uL (ref 0.00–0.07)
Basophils Absolute: 0 10*3/uL (ref 0.0–0.1)
Basophils Relative: 1 %
Eosinophils Absolute: 0.1 10*3/uL (ref 0.0–0.5)
Eosinophils Relative: 2 %
HCT: 36.2 % — ABNORMAL LOW (ref 39.0–52.0)
Hemoglobin: 11.3 g/dL — ABNORMAL LOW (ref 13.0–17.0)
Immature Granulocytes: 0 %
Lymphocytes Relative: 26 %
Lymphs Abs: 1.7 10*3/uL (ref 0.7–4.0)
MCH: 30.1 pg (ref 26.0–34.0)
MCHC: 31.2 g/dL (ref 30.0–36.0)
MCV: 96.3 fL (ref 80.0–100.0)
Monocytes Absolute: 0.5 10*3/uL (ref 0.1–1.0)
Monocytes Relative: 8 %
Neutro Abs: 4.2 10*3/uL (ref 1.7–7.7)
Neutrophils Relative %: 63 %
Platelet Count: 198 10*3/uL (ref 150–400)
RBC: 3.76 MIL/uL — ABNORMAL LOW (ref 4.22–5.81)
RDW: 13.6 % (ref 11.5–15.5)
WBC Count: 6.5 10*3/uL (ref 4.0–10.5)
nRBC: 0 % (ref 0.0–0.2)

## 2022-04-22 LAB — CMP (CANCER CENTER ONLY)
ALT: 19 U/L (ref 0–44)
AST: 15 U/L (ref 15–41)
Albumin: 4.2 g/dL (ref 3.5–5.0)
Alkaline Phosphatase: 50 U/L (ref 38–126)
Anion gap: 10 (ref 5–15)
BUN: 25 mg/dL — ABNORMAL HIGH (ref 8–23)
CO2: 23 mmol/L (ref 22–32)
Calcium: 9.2 mg/dL (ref 8.9–10.3)
Chloride: 110 mmol/L (ref 98–111)
Creatinine: 1.64 mg/dL — ABNORMAL HIGH (ref 0.61–1.24)
GFR, Estimated: 46 mL/min — ABNORMAL LOW (ref >60)
Glucose, Bld: 123 mg/dL — ABNORMAL HIGH (ref 70–99)
Potassium: 3.8 mmol/L (ref 3.5–5.1)
Sodium: 143 mmol/L (ref 135–145)
Total Bilirubin: 0.5 mg/dL (ref 0.3–1.2)
Total Protein: 6.7 g/dL (ref 6.5–8.1)

## 2022-04-22 LAB — TSH: TSH: 3.132 u[IU]/mL (ref 0.350–4.500)

## 2022-04-22 MED ORDER — SODIUM CHLORIDE 0.9 % IV SOLN: Freq: Once | INTRAVENOUS | Status: AC

## 2022-04-22 MED ORDER — SODIUM CHLORIDE 0.9 % IV SOLN
200.0000 mg | Freq: Once | INTRAVENOUS | Status: AC
  Administered 2022-04-22: 200 mg via INTRAVENOUS
  Filled 2022-04-22: qty 8

## 2022-04-22 NOTE — Patient Instructions (Signed)
[facility]  Discharge Instructions: Thank you for choosing [facility] to provide your oncology and hematology care.   If you have a lab appointment with the Cancer Center, please go directly to the Cancer Center and check in at the registration area.   Wear comfortable clothing and clothing appropriate for easy access to any Portacath or PICC line.   We strive to give you quality time with your provider. You may need to reschedule your appointment if you arrive late (15 or more minutes).  Arriving late affects you and other patients whose appointments are after yours.  Also, if you miss three or more appointments without notifying the office, you may be dismissed from the clinic at the provider's discretion.      For prescription refill requests, have your pharmacy contact our office and allow 72 hours for refills to be completed.    Today you received the following chemotherapy and/or immunotherapy agents Keytruda      To help prevent nausea and vomiting after your treatment, we encourage you to take your nausea medication as directed.  BELOW ARE SYMPTOMS THAT SHOULD BE REPORTED IMMEDIATELY: *FEVER GREATER THAN 100.4 F (38 C) OR HIGHER *CHILLS OR SWEATING *NAUSEA AND VOMITING THAT IS NOT CONTROLLED WITH YOUR NAUSEA MEDICATION *UNUSUAL SHORTNESS OF BREATH *UNUSUAL BRUISING OR BLEEDING *URINARY PROBLEMS (pain or burning when urinating, or frequent urination) *BOWEL PROBLEMS (unusual diarrhea, constipation, pain near the anus) TENDERNESS IN MOUTH AND THROAT WITH OR WITHOUT PRESENCE OF ULCERS (sore throat, sores in mouth, or a toothache) UNUSUAL RASH, SWELLING OR PAIN  UNUSUAL VAGINAL DISCHARGE OR ITCHING   Items with * indicate a potential emergency and should be followed up as soon as possible or go to the Emergency Department if any problems should occur.  Please show the CHEMOTHERAPY ALERT CARD or IMMUNOTHERAPY ALERT CARD at check-in to the  Emergency Department and triage nurse.  Should you have questions after your visit or need to cancel or reschedule your appointment, please contact [facility]  Dept: [phone]  and follow the prompts.  Office hours are 8:00 a.m. to 4:30 p.m. Monday - Friday. Please note that voicemails left after 4:00 p.m. may not be returned until the following business day.  We are closed weekends and major holidays. You have access to a nurse at all times for urgent questions. Please call the main number to the clinic Dept: [phone] and follow the prompts.   For any non-urgent questions, you may also contact your provider using MyChart. We now offer e-Visits for anyone 18 and older to request care online for non-urgent symptoms. For details visit [email or web address].   Also download the MyChart app! Go to the app store, search "MyChart", open the app, select [facility], and log in with your MyChart username and password.  Masks are optional in the cancer centers. If you would like for your care team to wear a mask while they are taking care of you, please let them know. For doctor visits, patients may have with them one support person who is at least 65 years old. At this time, visitors are not allowed in the infusion area.   

## 2022-04-22 NOTE — Progress Notes (Signed)
Patient seen by Dr. Benay Spice today  Vitals are within treatment parameters.  Labs reviewed by Dr. Benay Spice and are not all within treatment parameters. Creatinine 1.64-- OK to treat.  Per physician team, patient is ready for treatment and there are NO modifications to the treatment plan.

## 2022-04-22 NOTE — Progress Notes (Signed)
[place] OFFICE PROGRESS NOTE   Diagnosis: Prostate cancer, renal cell carcinoma  INTERVAL HISTORY:   [name] returns as scheduled.  He continues axitinib once daily.  He completed another cycle of pembrolizumab on 04/01/2022.  No rash or diarrhea.  He continues to have skin thickening and dryness at the palms and soles.  He has discomfort at the plantar surface of the right foot.  No bleeding.  Objective:  Vital signs in last 24 hours:  Blood pressure (!) 146/76, pulse 100, temperature 98.2 F (36.8 C), temperature source Oral, resp. rate 20, height '5\' 5"'  (1.651 m), weight 147 lb (66.7 kg), SpO2 100 %.    HEENT: No thrush or ulcers Resp: Lungs clear bilaterally Cardio: Regular rate and rhythm GI: No hepatosplenomegaly Vascular: No leg edema, the right lower leg is slightly larger than the left side  Skin: Dryness over the back, skin thickening and dryness at the palms and soles.  Callus formation at the soles.  No erythema   Lab Results:  Lab Results  Component Value Date   WBC 6.5 04/22/2022   HGB 11.3 (L) 04/22/2022   HCT 36.2 (L) 04/22/2022   MCV 96.3 04/22/2022   PLT 198 04/22/2022   NEUTROABS 4.2 04/22/2022    CMP  Lab Results  Component Value Date   NA 143 04/22/2022   K 3.8 04/22/2022   CL 110 04/22/2022   CO2 23 04/22/2022   GLUCOSE 123 (H) 04/22/2022   BUN 25 (H) 04/22/2022   CREATININE 1.64 (H) 04/22/2022   CALCIUM 9.2 04/22/2022   PROT 6.7 04/22/2022   ALBUMIN 4.2 04/22/2022   AST 15 04/22/2022   ALT 19 04/22/2022   ALKPHOS 50 04/22/2022   BILITOT 0.5 04/22/2022   GFRNONAA 46 (L) 04/22/2022   GFRAA >60 03/05/2020    Lab Results  Component Value Date   CEA1 3.31 05/03/2019     Medications: I have reviewed the patient's current medications.   Assessment/Plan: History of Severe anemia-status post a red cell transfusion 03/04/2016 2.. Prostate cancer Extensive sclerotic bone metastases noted on a CT of the  abdomen/pelvis 03/04/2016 and MRI abdomen 03/05/2016 Markedly elevated PSA Lupron initiated 03/15/2016; Casodex 14 days beginning 03/15/2016 Prostate biopsy 03/18/2016 05/09/2016 PSA significantly improved at 21 Abiraterone initiated 05/10/2016 PSA less than 0.1 07/10/2018 PSA less than 0.1 08/30/2018 PSA less than 0.1 10/18/2018 PSA less than 0.1 11/29/2018 PSA less than 0.1 on 11/06/2019  PSA less than 0.1 on 03/05/2020 PSA less than 0.1 on 07/06/2020 PSA less than 0.1 08/17/2021 PSA less than 0.1 on 09/30/2021 PSA 0.1 on 11/12/2021 PSA 0.1 on 02/18/2022   3.   Symptoms of obstructive uropathy. Improved   4.   Distal duodenal and descending colon masses biopsy of the duodenal mass 03/04/2016 confirmed fragments of small bowel mucosa with focal high-grade dysplasia Biopsy of the descending colon "polyps" revealed fragments of a tubulovillous adenoma CT 08/29/2017-similar soft tissue thickening in the region of the splenic flexure of the colon suboptimally evaluated due to lack of colonic enteric contrast Referred to Dr. Benson Norway Referred to [facility] Resection of descending colon polyps 01/31/2018, tubulovillous adenomas with high-grade dysplasia Attempted endoscopic resection of the fourth segment duodenal "polyp" on 05/09/2018, firm unresectable lesion, biopsy revealed moderately differentiated adenocarcinoma CT abdomen/pelvis 05/25/2018- heterogeneously enhancing right lower pole renal lesion; diffuse sclerotic osseous metastatic lesions compatible with history of prostate cancer CT chest 05/25/2018- no pulmonary metastasis.  Diffuse osseous metastasis involving the axial and appendicular skeleton. 06/19/2018-sleeve resection  of third and fourth portions of duodenum with primary end-to-end anastomosis, feeding gastrojejunostomy tube placement, Dr. Cloyd Stagers; poorly differentiated adenocarcinoma duodenum, 3 cm; tumor invades the muscularis propria; lymphovascular invasion present; negative margins; 4 of 11  involved lymph nodes; soft tissue deposits also seen in the mesentery; all mucosal margins and soft tissue margins negative but 2 of the positive nodes are found in the soft tissue margin submitted for frozen section (pT2 pN2); second mucosal lesion 1.2 cm from the proximal margin, tubular adenoma with focal severe dysplasia Intact expression of mismatch repair proteins by IHC; microsatellite stable Cycle 1 adjuvant Xeloda 07/23/2018 Cycle 2 adjuvant Xeloda 08/13/2018 Cycle 3 adjuvant Xeloda 09/10/2018 (dose reduced to 1000 mg twice daily for 14 days)  Cycle 4 adjuvant Xeloda 10/01/2018 (dose increased to 1500 mg every morning and 1000 mg every afternoon for 14 days) Cycle 5 adjuvant Xeloda 10/22/2018 Cycle 6 adjuvant Xeloda 11/12/2018 Cycle 7 adjuvant Xeloda 12/03/2018 Cycle 8 adjuvant Xeloda 12/24/2018   5.  Anorexia/weight loss- resolved   6.  Undescended testicles   7.  Right renal mass.  CT 08/29/2017-right renal mass involving the lower pole was partially calcified and does demonstrate enhancement following contrast most consistent with renal cell carcinoma.  Lesion not significantly enlarged compared with the prior CT. Followed by urology. 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 to the IVC/RA junction, upper normal size retrocaval node 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, nuclear grade 3 Axitinib/pembrolizumab 10/22/2021 Axitinib placed on hold 11/05/2021  due to hand-foot syndrome Axitinib resumed at a dose of 5 mg daily 11/12/2021, pembrolizumab 11/12/2021 Axitinib discontinued 12/03/2021 secondary to progressive hand-foot syndrome, pembrolizumab 12/03/2021 Axitinib resumed at a reduced dose of 2 mg twice daily after office visit 12/14/2021 Pembrolizumab 12/24/2021 Axitinib stopped 01/03/2022 secondary to persistent hand/foot symptoms CT chest at [facility] 01/13/2022-no evidence of thoracic metastatic disease, similar diffuse sclerotic change in the skeleton MRI abdomen at [facility] 01/13/2022-similar size of right lower pole renal mass with renal vein thrombosis, decreased overall extent of thrombus now extending to the intrahepatic IVC, enhancement of right renal mass and thrombus is decreased, new left hydroureter ureter nephrosis with asymmetric perinephric stranding Pembrolizumab 01/27/2022 Axitinib resumed 2 mg daily 01/28/2022 CT abdomen/pelvis at [facility] 02/10/2022-overall reduced size of right lower pole renal mass which demonstrates persistent region of enhancement along the posterior and lateral margin with extension into the renal sinus.  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  Query additional IVC thrombus at the intrahepatic IVC and extending into the right atrium. Axitinib continued 2 mg daily, Pembrolizumab every 3 weeks   8.  Diabetes   9.  Anemia-stool Hemoccults + March 2021, referred to Dr. Benson Norway for endoscopic evaluation, anemia also secondary to metastatic prostate cancer, hypogonadism Small bowel enteroscopy 02/11/2020-esophagus normal.  Hematin found in the gastric body.  Examined duodenum normal.  No evidence of significant pathology in the entire examined portion of the jejunum.  There was no overt source of bleeding or inflammation.  The bleeding may be from small AVMs.  Surgical  anastomosis was not visible with repeated inspection of the duodenum. Colonoscopy 02/11/2020-multiple sessile and semipedunculated polyps were  found in the rectum, descending colon and ascending colon, 3 to 18 mm in size.  Polyps removed (polypectomy descending colon-fragments of tubular adenoma, inflammatory type polyp; polypectomy ascending colon-tubular adenoma, no high-grade dysplasia or malignancy; polypectomy descending colon-fragments of tubulovillous adenoma, no high-grade dysplasia or malignancy; polypectomy colon, rectum-tubular adenoma, no high-grade dysplasia or malignancy). Colonoscopy 02/02/2021-seven 2 to 10 mm polyps in the sigmoid colon (tubular adenoma), descending colon (fragments of tubulovillous adenoma, fragments of tubular adenoma ) and transverse colon (fragments of tubular adenoma). Progressive anemia 07/05/2021; stool cards positive Upper endoscopy/colonoscopy 09/23/2021-multiple polyps removed from the colon-tubular adenomas, diffuse angioectasias in the stomach with bleeding-not amenable to endoscopic treatment 10.  Renal failure-progressive 01/17/2022 11.  CT chest at [facility] 01/13/2022-no evidence of thoracic metastatic disease, similar diffuse sclerotic changes in the skeleton 12.  CT renal stone study 01/14/2022-mild left side hydroureteronephrosis secondary to a left UVJ stone, left nephrolithiasis, new right middle lobe collapse/consolidation, edema/fluid at the left iliopsoas muscle      Disposition: Brandon Peters continues treatment with axitinib and pembrolizumab for renal cell carcinoma.  He is tolerating the treatment well aside from hand/foot syndrome related to axitinib.  This is tolerable at the current axitinib dose.  He will continue the current treatment.  He is scheduled for repeat imaging at [facility] next week.  Brandon Peters continues abiraterone and prednisone for treatment of metastatic prostate cancer.  He will return for an office visit and pembrolizumab in 3 weeks.  [name], MD  04/22/2022  10:09 AM

## 2022-04-23 ENCOUNTER — Other Ambulatory Visit: Payer: Self-pay

## 2022-04-23 LAB — PROSTATE-SPECIFIC AG, SERUM (LABCORP): Prostate Specific Ag, Serum: 0.1 ng/mL (ref 0.0–4.0)

## 2022-04-25 ENCOUNTER — Other Ambulatory Visit (HOSPITAL_COMMUNITY): Payer: Self-pay

## 2022-04-26 ENCOUNTER — Other Ambulatory Visit: Payer: Self-pay

## 2022-04-27 ENCOUNTER — Other Ambulatory Visit: Payer: Self-pay

## 2022-05-04 ENCOUNTER — Telehealth: Payer: Self-pay | Admitting: *Deleted

## 2022-05-04 NOTE — Telephone Encounter (Signed)
Jessi Jessop was contacted by telephone to verify understanding of discharge instructions status post their most recent discharge from the hospital on the date:  05/02/22.  Reviewed with patient cancer center appointments.  Verification of understanding for oncology specific follow-up was validated using the Teach Back method.   Reports he is voiding well and having no pain. Tells RN that he did have right ureteral stent placed. Follows up tomorrow w/urology.  Transportation to appointments were confirmed for the patient as being self/caregiver.  Larri Boot's questions were addressed to their satisfaction upon completion of this post discharge follow-up call for outpatient oncology.

## 2022-05-07 ENCOUNTER — Other Ambulatory Visit: Payer: Self-pay | Admitting: Oncology

## 2022-05-12 ENCOUNTER — Other Ambulatory Visit (HOSPITAL_COMMUNITY): Payer: Self-pay

## 2022-05-13 ENCOUNTER — Inpatient Hospital Stay: Payer: Medicare Other | Attending: Oncology

## 2022-05-13 ENCOUNTER — Encounter: Payer: Self-pay | Admitting: *Deleted

## 2022-05-13 ENCOUNTER — Ambulatory Visit: Payer: Medicare Other | Admitting: Oncology

## 2022-05-13 ENCOUNTER — Inpatient Hospital Stay: Payer: Medicare Other

## 2022-05-13 ENCOUNTER — Inpatient Hospital Stay: Payer: Medicare Other | Admitting: Nurse Practitioner

## 2022-05-13 ENCOUNTER — Encounter: Payer: Self-pay | Admitting: Nurse Practitioner

## 2022-05-13 VITALS — BP 131/76 | HR 63

## 2022-05-13 VITALS — BP 153/69 | HR 95 | Temp 98.1°F | Resp 18 | Ht 65.0 in | Wt 151.2 lb

## 2022-05-13 DIAGNOSIS — C641 Malignant neoplasm of right kidney, except renal pelvis: Secondary | ICD-10-CM | POA: Insufficient documentation

## 2022-05-13 DIAGNOSIS — Z79899 Other long term (current) drug therapy: Secondary | ICD-10-CM | POA: Diagnosis not present

## 2022-05-13 DIAGNOSIS — C7951 Secondary malignant neoplasm of bone: Secondary | ICD-10-CM | POA: Diagnosis present

## 2022-05-13 DIAGNOSIS — C61 Malignant neoplasm of prostate: Secondary | ICD-10-CM

## 2022-05-13 DIAGNOSIS — Z5112 Encounter for antineoplastic immunotherapy: Secondary | ICD-10-CM | POA: Insufficient documentation

## 2022-05-13 LAB — CBC WITH DIFFERENTIAL (CANCER CENTER ONLY)
Abs Immature Granulocytes: 0.04 10*3/uL (ref 0.00–0.07)
Basophils Absolute: 0 10*3/uL (ref 0.0–0.1)
Basophils Relative: 0 %
Eosinophils Absolute: 0.2 10*3/uL (ref 0.0–0.5)
Eosinophils Relative: 3 %
HCT: 30 % — ABNORMAL LOW (ref 39.0–52.0)
Hemoglobin: 9.2 g/dL — ABNORMAL LOW (ref 13.0–17.0)
Immature Granulocytes: 1 %
Lymphocytes Relative: 17 %
Lymphs Abs: 1.2 10*3/uL (ref 0.7–4.0)
MCH: 29.7 pg (ref 26.0–34.0)
MCHC: 30.7 g/dL (ref 30.0–36.0)
MCV: 96.8 fL (ref 80.0–100.0)
Monocytes Absolute: 0.7 10*3/uL (ref 0.1–1.0)
Monocytes Relative: 9 %
Neutro Abs: 4.8 10*3/uL (ref 1.7–7.7)
Neutrophils Relative %: 70 %
Platelet Count: 204 10*3/uL (ref 150–400)
RBC: 3.1 MIL/uL — ABNORMAL LOW (ref 4.22–5.81)
RDW: 13.9 % (ref 11.5–15.5)
WBC Count: 7 10*3/uL (ref 4.0–10.5)
nRBC: 0 % (ref 0.0–0.2)

## 2022-05-13 LAB — CMP (CANCER CENTER ONLY)
ALT: 11 U/L (ref 0–44)
AST: 11 U/L — ABNORMAL LOW (ref 15–41)
Albumin: 3.8 g/dL (ref 3.5–5.0)
Alkaline Phosphatase: 49 U/L (ref 38–126)
Anion gap: 11 (ref 5–15)
BUN: 29 mg/dL — ABNORMAL HIGH (ref 8–23)
CO2: 20 mmol/L — ABNORMAL LOW (ref 22–32)
Calcium: 8.8 mg/dL — ABNORMAL LOW (ref 8.9–10.3)
Chloride: 110 mmol/L (ref 98–111)
Creatinine: 1.58 mg/dL — ABNORMAL HIGH (ref 0.61–1.24)
GFR, Estimated: 49 mL/min — ABNORMAL LOW (ref >60)
Glucose, Bld: 151 mg/dL — ABNORMAL HIGH (ref 70–99)
Potassium: 4.1 mmol/L (ref 3.5–5.1)
Sodium: 141 mmol/L (ref 135–145)
Total Bilirubin: 0.7 mg/dL (ref 0.3–1.2)
Total Protein: 6.3 g/dL — ABNORMAL LOW (ref 6.5–8.1)

## 2022-05-13 MED ORDER — SODIUM CHLORIDE 0.9 % IV SOLN
200.0000 mg | Freq: Once | INTRAVENOUS | Status: AC
  Administered 2022-05-13: 200 mg via INTRAVENOUS
  Filled 2022-05-13: qty 8

## 2022-05-13 MED ORDER — SODIUM CHLORIDE 0.9 % IV SOLN: Freq: Once | INTRAVENOUS | Status: AC

## 2022-05-13 NOTE — Progress Notes (Signed)
[facility] OFFICE PROGRESS NOTE   Diagnosis: Prostate cancer, renal cell carcinoma  INTERVAL HISTORY:   Mr. Schlender returns as scheduled.  He completed another cycle of Pembrolizumab 04/22/2022.  Feet became painful last week on Wednesday.  He discontinued axitinib at that time.  Foot pain is better.  No nausea or vomiting.  No mouth sores.  He reports 2-3 watery bowel movements a day for the past 2 weeks.  He notes blood in his urine since placement of left ureter stent 04/29/2022.  Objective:  Vital signs in last 24 hours:  Blood pressure (!) 153/69, pulse 95, temperature 98.1 F (36.7 C), temperature source Oral, resp. rate 18, height _0  (1.651 m), weight 151 lb 3.2 oz (68.6 kg), SpO2 100 %.    HEENT: No thrush or ulcers. Resp: Lungs clear bilaterally. Cardio: Regular rate and rhythm. GI: No hepatosplenomegaly. Vascular: No leg edema. Skin: Soles with erythema.  No skin breakdown.  Palms without erythema.   Lab Results:  Lab Results  Component Value Date   WBC 7.0 05/13/2022   HGB 9.2 (L) 05/13/2022   HCT 30.0 (L) 05/13/2022   MCV 96.8 05/13/2022   PLT 204 05/13/2022   NEUTROABS 4.8 05/13/2022    Imaging:  No results found.  Medications: I have reviewed the patient's current medications.  Assessment/Plan: History of Severe anemia-status post a red cell transfusion 03/04/2016 2.. Prostate cancer Extensive sclerotic bone metastases noted on a CT of the abdomen/pelvis 03/04/2016 and MRI abdomen 03/05/2016 Markedly elevated PSA Lupron initiated 03/15/2016; Casodex 14 days beginning 03/15/2016 Prostate biopsy 03/18/2016 05/09/2016 PSA significantly improved at 21 Abiraterone initiated 05/10/2016 PSA less than 0.1 07/10/2018 PSA less than 0.1 08/30/2018 PSA less than 0.1 10/18/2018 PSA less than 0.1 11/29/2018 PSA less than 0.1 on 11/06/2019  PSA less than 0.1 on 03/05/2020 PSA less than 0.1 on 07/06/2020 PSA less than 0.1 08/17/2021 PSA less than  0.1 on 09/30/2021 PSA 0.1 on 11/12/2021 PSA 0.1 on 02/18/2022   3.   Symptoms of obstructive uropathy. Improved   4.   Distal duodenal and descending colon masses biopsy of the duodenal mass 03/04/2016 confirmed fragments of small bowel mucosa with focal high-grade dysplasia Biopsy of the descending colon "polyps" revealed fragments of a tubulovillous adenoma CT 08/29/2017-similar soft tissue thickening in the region of the splenic flexure of the colon suboptimally evaluated due to lack of colonic enteric contrast Referred to Dr. Benson Norway Referred to [facility] Resection of descending colon polyps 01/31/2018, tubulovillous adenomas with high-grade dysplasia Attempted endoscopic resection of the fourth segment duodenal "polyp" on 05/09/2018, firm unresectable lesion, biopsy revealed moderately differentiated adenocarcinoma CT abdomen/pelvis 05/25/2018- heterogeneously enhancing right lower pole renal lesion; diffuse sclerotic osseous metastatic lesions compatible with history of prostate cancer CT chest 05/25/2018- no pulmonary metastasis.  Diffuse osseous metastasis involving the axial and appendicular skeleton. 06/19/2018-sleeve resection of third and fourth portions of duodenum with primary end-to-end anastomosis, feeding gastrojejunostomy tube placement, Dr. Cloyd Stagers; poorly differentiated adenocarcinoma duodenum, 3 cm; tumor invades the muscularis propria; lymphovascular invasion present; negative margins; 4 of 11 involved lymph nodes; soft tissue deposits also seen in the mesentery; all mucosal margins and soft tissue margins negative but 2 of the positive nodes are found in the soft tissue margin submitted for frozen section (pT2 pN2); second mucosal lesion 1.2 cm from the proximal margin, tubular adenoma with focal severe dysplasia Intact expression of mismatch repair proteins by IHC; microsatellite stable Cycle 1 adjuvant Xeloda 07/23/2018 Cycle 2 adjuvant Xeloda 08/13/2018 Cycle  3 adjuvant Xeloda 09/10/2018  (dose reduced to 1000 mg twice daily for 14 days)  Cycle 4 adjuvant Xeloda 10/01/2018 (dose increased to 1500 mg every morning and 1000 mg every afternoon for 14 days) Cycle 5 adjuvant Xeloda 10/22/2018 Cycle 6 adjuvant Xeloda 11/12/2018 Cycle 7 adjuvant Xeloda 12/03/2018 Cycle 8 adjuvant Xeloda 12/24/2018   5.  Anorexia/weight loss- resolved   6.  Undescended testicles   7.  Right renal mass.  CT 08/29/2017-right renal mass involving the lower pole was partially calcified and does demonstrate enhancement following contrast most consistent with renal cell carcinoma.  Lesion not significantly enlarged compared with the prior CT. Followed by urology. 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 to the IVC/RA junction, upper normal size retrocaval node 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, nuclear grade 3 Axitinib/pembrolizumab 10/22/2021 Axitinib placed on hold 11/05/2021 due to hand-foot syndrome Axitinib resumed at a dose of 5 mg daily 11/12/2021, pembrolizumab 11/12/2021 Axitinib discontinued 12/03/2021 secondary to progressive hand-foot syndrome, pembrolizumab 12/03/2021 Axitinib resumed at a reduced dose of 2 mg twice daily after office visit 12/14/2021 Pembrolizumab 12/24/2021 Axitinib stopped 01/03/2022 secondary to persistent hand/foot symptoms CT chest at [facility] 01/13/2022-no evidence of thoracic metastatic disease, similar diffuse sclerotic change in the skeleton MRI  abdomen at [facility] 01/13/2022-similar size of right lower pole renal mass with renal vein thrombosis, decreased overall extent of thrombus now extending to the intrahepatic IVC, enhancement of right renal mass and thrombus is decreased, new left hydroureter ureter nephrosis with asymmetric perinephric stranding Pembrolizumab 01/27/2022 Axitinib resumed 2 mg daily 01/28/2022 CT abdomen[facility]pelvis at [facility] 02/10/2022-overall reduced size of right lower pole renal mass which demonstrates persistent region of enhancement along the posterior and lateral margin with extension into the renal sinus.  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  Query additional IVC thrombus at the intrahepatic IVC and extending into the right atrium. Axitinib continued 2 mg daily, Pembrolizumab every 3 weeks CT 05/05/2022 at [facility]-partially visualized pulmonary embolism involving segmental arteries of the right lower lobe.  Interval slight decrease in size of the mass arising from the lower pole the right kidney.  Mass demonstrates persistent moderate necrosis centrally.  Increased enhancing soft tissue component measuring up to 2 cm which is mildly increased in size compared to prior from 02/10/2022.  Bland tumor thrombus identified in the IVC at the level of the right renal vein ostium extending into the suprarenal IVC superiorly.  No obvious thrombus identified in the infrarenal, intrahepatic and suprahepatic IVC.  Likely trace bland thrombus in the anterior right renal vein.  Stable several subcentimeter retroperitoneal lymph nodes.  Diffuse osseous sclerotic and lytic metastatic disease stable compared to prior. Axitinib placed on hold 05/04/2022 due to pain bilateral feet; 05/13/2022 he will continue to hold axitinib Pembrolizumab 05/13/2022   8.  Diabetes   9.  Anemia-stool Hemoccults + March 2021, referred to Dr. Benson Norway for endoscopic evaluation, anemia also secondary to metastatic prostate cancer, hypogonadism Small  bowel enteroscopy 02/11/2020-esophagus normal.  Hematin found in the gastric body.  Examined  duodenum normal.  No evidence of significant pathology in the entire examined portion of the jejunum.  There was no overt source of bleeding or inflammation.  The bleeding may be from small AVMs.  Surgical anastomosis was not visible with repeated inspection of the duodenum. Colonoscopy 02/11/2020-multiple sessile and semipedunculated polyps were found in the rectum, descending colon and ascending colon, 3 to 18 mm in size.  Polyps removed (polypectomy descending colon-fragments of tubular adenoma, inflammatory type polyp; polypectomy ascending colon-tubular adenoma, no high-grade dysplasia or malignancy; polypectomy descending colon-fragments of tubulovillous adenoma, no high-grade dysplasia or malignancy; polypectomy colon, rectum-tubular adenoma, no high-grade dysplasia or malignancy). Colonoscopy 02/02/2021-seven 2 to 10 mm polyps in the sigmoid colon (tubular adenoma), descending colon (fragments of tubulovillous adenoma, fragments of tubular adenoma ) and transverse colon (fragments of tubular adenoma). Progressive anemia 07/05/2021; stool cards positive Upper endoscopy/colonoscopy 09/23/2021-multiple polyps removed from the colon-tubular adenomas, diffuse angioectasias in the stomach with bleeding-not amenable to endoscopic treatment 10.  Renal failure-progressive 01/17/2022 11.  CT chest at [facility] 01/13/2022-no evidence of thoracic metastatic disease, similar diffuse sclerotic changes in the skeleton 12.  CT renal stone study 01/14/2022-mild left side hydroureteronephrosis secondary to a left UVJ stone, left nephrolithiasis, new right middle lobe collapse/consolidation, edema/fluid at the left iliopsoas muscle 13.  PE noted on CT 05/05/2022      Disposition: Mr. Macqueen appears stable.  He is currently on active treatment with axitinib/Pembrolizumab.  He discontinued axitinib last week due to pain on the soles of  the feet.  We recommend he continue to hold axitinib.  Plan to continue with every 3-week Pembrolizumab for now.  Per Dr. Althea Charon office note 05/05/2022 the plan is for right radical nephrectomy with IVC thrombectomy.  We have contacted Dr. Althea Charon office to discuss the treatment plan going forward and are awaiting a return call.  CBC and chemistry panel from today reviewed.  Labs adequate to proceed with Pembrolizumab.  Hemoglobin is lower.  This might be related to the recent ureter stent placement as he has noted hematuria.  Plan for repeat CBC in 2 weeks, red cell transfusion support as needed.  Mr. Kutzer will return for lab and follow-up in 2 weeks.  We are available to see him sooner if needed.  Patient seen with Dr. Benay Spice.    Ned Card ANP/GNP-BC   05/13/2022  8:43 AM This was a shared visit with Ned Card.  Mr. Sumler continues to have poor tolerance of the axitinib.  This will be placed on hold.  He will continue pembrolizumab. I discussed the case with Dr. Edd Arbour.  I reviewed the restaging findings.  He is scheduled for hematology consult at [facility] next week.  A decision will be made on proceeding with the nephrectomy/thrombectomy after this consultation.  He continues abiraterone/prednisone for treatment of metastatic prostate cancer.  I was present for greater than 50% of today's visit.  I performed medical decision making.  Julieanne Manson, MD

## 2022-05-13 NOTE — Progress Notes (Signed)
Patient seen by Ned Card NP today  Vitals are within treatment parameters. Provider aware of SBP 153--no new orders.  Labs reviewed by  Ned Card NP and are not all within treatment parameters. Creatinine 1.58 --OK to treat.  Per physician team, patient is ready for treatment. Please note that modifications are being made to the treatment plan including Holding axitinib for now and proceed with pembrolizumab

## 2022-05-13 NOTE — Progress Notes (Signed)
Per office visit note from Owens Shark, NP, ok to proceed with pembrolizumab with current lab results  Laray Anger, PharmD PGY-2 Pharmacy Resident Hematology/Oncology [phone]  05/13/2022 10:08 AM

## 2022-05-14 ENCOUNTER — Other Ambulatory Visit: Payer: Self-pay

## 2022-05-20 ENCOUNTER — Other Ambulatory Visit (HOSPITAL_COMMUNITY): Payer: Self-pay

## 2022-05-23 ENCOUNTER — Other Ambulatory Visit (HOSPITAL_COMMUNITY): Payer: Self-pay

## 2022-05-23 ENCOUNTER — Other Ambulatory Visit: Payer: Self-pay | Admitting: Oncology

## 2022-05-23 DIAGNOSIS — C61 Malignant neoplasm of prostate: Secondary | ICD-10-CM

## 2022-05-23 MED ORDER — ABIRATERONE ACETATE 250 MG PO TABS
ORAL_TABLET | ORAL | 5 refills | Status: DC
  Filled 2022-05-23: qty 120, 30d supply, fill #0
  Filled 2022-06-17: qty 120, 30d supply, fill #1
  Filled 2022-08-01: qty 120, 30d supply, fill #2
  Filled 2022-08-29: qty 120, 30d supply, fill #3
  Filled 2022-09-29: qty 120, 30d supply, fill #4
  Filled 2022-10-27: qty 120, 30d supply, fill #5

## 2022-05-26 ENCOUNTER — Other Ambulatory Visit (HOSPITAL_COMMUNITY): Payer: Self-pay

## 2022-05-27 ENCOUNTER — Inpatient Hospital Stay: Payer: Medicare Other | Attending: Oncology

## 2022-05-27 ENCOUNTER — Inpatient Hospital Stay: Payer: Medicare Other | Admitting: Nurse Practitioner

## 2022-05-27 ENCOUNTER — Encounter: Payer: Self-pay | Admitting: Nurse Practitioner

## 2022-05-27 VITALS — BP 132/77 | HR 85 | Temp 98.1°F | Resp 20 | Ht 65.0 in | Wt 153.6 lb

## 2022-05-27 DIAGNOSIS — C641 Malignant neoplasm of right kidney, except renal pelvis: Secondary | ICD-10-CM | POA: Insufficient documentation

## 2022-05-27 DIAGNOSIS — C179 Malignant neoplasm of small intestine, unspecified: Secondary | ICD-10-CM

## 2022-05-27 DIAGNOSIS — I2699 Other pulmonary embolism without acute cor pulmonale: Secondary | ICD-10-CM | POA: Diagnosis not present

## 2022-05-27 DIAGNOSIS — C7951 Secondary malignant neoplasm of bone: Secondary | ICD-10-CM | POA: Diagnosis present

## 2022-05-27 DIAGNOSIS — Z5112 Encounter for antineoplastic immunotherapy: Secondary | ICD-10-CM | POA: Diagnosis present

## 2022-05-27 DIAGNOSIS — Z79899 Other long term (current) drug therapy: Secondary | ICD-10-CM | POA: Diagnosis not present

## 2022-05-27 DIAGNOSIS — Z7901 Long term (current) use of anticoagulants: Secondary | ICD-10-CM | POA: Insufficient documentation

## 2022-05-27 DIAGNOSIS — C61 Malignant neoplasm of prostate: Secondary | ICD-10-CM | POA: Diagnosis present

## 2022-05-27 DIAGNOSIS — E119 Type 2 diabetes mellitus without complications: Secondary | ICD-10-CM | POA: Insufficient documentation

## 2022-05-27 DIAGNOSIS — N19 Unspecified kidney failure: Secondary | ICD-10-CM | POA: Diagnosis not present

## 2022-05-27 LAB — CBC WITH DIFFERENTIAL (CANCER CENTER ONLY)
Abs Immature Granulocytes: 0.04 10*3/uL (ref 0.00–0.07)
Basophils Absolute: 0 10*3/uL (ref 0.0–0.1)
Basophils Relative: 0 %
Eosinophils Absolute: 0.1 10*3/uL (ref 0.0–0.5)
Eosinophils Relative: 2 %
HCT: 30.7 % — ABNORMAL LOW (ref 39.0–52.0)
Hemoglobin: 9.1 g/dL — ABNORMAL LOW (ref 13.0–17.0)
Immature Granulocytes: 1 %
Lymphocytes Relative: 19 %
Lymphs Abs: 1.2 10*3/uL (ref 0.7–4.0)
MCH: 29.8 pg (ref 26.0–34.0)
MCHC: 29.6 g/dL — ABNORMAL LOW (ref 30.0–36.0)
MCV: 100.7 fL — ABNORMAL HIGH (ref 80.0–100.0)
Monocytes Absolute: 0.6 10*3/uL (ref 0.1–1.0)
Monocytes Relative: 9 %
Neutro Abs: 4.2 10*3/uL (ref 1.7–7.7)
Neutrophils Relative %: 69 %
Platelet Count: 224 10*3/uL (ref 150–400)
RBC: 3.05 MIL/uL — ABNORMAL LOW (ref 4.22–5.81)
RDW: 14.5 % (ref 11.5–15.5)
WBC Count: 6.2 10*3/uL (ref 4.0–10.5)
nRBC: 0 % (ref 0.0–0.2)

## 2022-05-27 NOTE — Progress Notes (Signed)
[place] OFFICE PROGRESS NOTE   Diagnosis: Prostate cancer, renal cell carcinoma  INTERVAL HISTORY:   [name] returns as scheduled.  He completed another cycle of Pembrolizumab 05/13/2022.  No rash or diarrhea.  No nausea or vomiting.  He intermittently notes a small amount of blood in his urine.  He has a good appetite.  He reports surgery is scheduled on 06/27/2022.  Objective:  Vital signs in last 24 hours:  Blood pressure 132/77, pulse 85, temperature 98.1 F (36.7 C), temperature source Oral, resp. rate 20, height '5\' 5"'  (1.651 m), weight 153 lb 9.6 oz (69.7 kg), SpO2 100 %.    HEENT: No thrush or ulcers. Resp: Lungs clear bilaterally. Cardio: Regular rate and rhythm. GI: Abdomen soft and nontender.  No hepatosplenomegaly. Vascular: No leg edema. Skin: No rash.   Lab Results:  Lab Results  Component Value Date   WBC 6.2 05/27/2022   HGB 9.1 (L) 05/27/2022   HCT 30.7 (L) 05/27/2022   MCV 100.7 (H) 05/27/2022   PLT 224 05/27/2022   NEUTROABS 4.2 05/27/2022    Imaging:  No results found.  Medications: I have reviewed the patient's current medications.  Assessment/Plan: History of Severe anemia-status post a red cell transfusion 03/04/2016 2.. Prostate cancer Extensive sclerotic bone metastases noted on a CT of the abdomen/pelvis 03/04/2016 and MRI abdomen 03/05/2016 Markedly elevated PSA Lupron initiated 03/15/2016; Casodex 14 days beginning 03/15/2016 Prostate biopsy 03/18/2016 05/09/2016 PSA significantly improved at 21 Abiraterone initiated 05/10/2016 PSA less than 0.1 07/10/2018 PSA less than 0.1 08/30/2018 PSA less than 0.1 10/18/2018 PSA less than 0.1 11/29/2018 PSA less than 0.1 on 11/06/2019  PSA less than 0.1 on 03/05/2020 PSA less than 0.1 on 07/06/2020 PSA less than 0.1 08/17/2021 PSA less than 0.1 on 09/30/2021 PSA 0.1 on 11/12/2021 PSA 0.1 on 02/18/2022   3.   Symptoms of obstructive uropathy. Improved   4.   Distal duodenal  and descending colon masses biopsy of the duodenal mass 03/04/2016 confirmed fragments of small bowel mucosa with focal high-grade dysplasia Biopsy of the descending colon "polyps" revealed fragments of a tubulovillous adenoma CT 08/29/2017-similar soft tissue thickening in the region of the splenic flexure of the colon suboptimally evaluated due to lack of colonic enteric contrast Referred to [facility] Referred to [facility] Resection of descending colon polyps 01/31/2018, tubulovillous adenomas with high-grade dysplasia Attempted endoscopic resection of the fourth segment duodenal "polyp" on 05/09/2018, firm unresectable lesion, biopsy revealed moderately differentiated adenocarcinoma CT abdomen/pelvis 05/25/2018- heterogeneously enhancing right lower pole renal lesion; diffuse sclerotic osseous metastatic lesions compatible with history of prostate cancer CT chest 05/25/2018- no pulmonary metastasis.  Diffuse osseous metastasis involving the axial and appendicular skeleton. 06/19/2018-sleeve resection of third and fourth portions of duodenum with primary end-to-end anastomosis, feeding gastrojejunostomy tube placement, [facility]; poorly differentiated adenocarcinoma duodenum, 3 cm; tumor invades the muscularis propria; lymphovascular invasion present; negative margins; 4 of 11 involved lymph nodes; soft tissue deposits also seen in the mesentery; all mucosal margins and soft tissue margins negative but 2 of the positive nodes are found in the soft tissue margin submitted for frozen section (pT2 pN2); second mucosal lesion 1.2 cm from the proximal margin, tubular adenoma with focal severe dysplasia Intact expression of mismatch repair proteins by IHC; microsatellite stable Cycle 1 adjuvant Xeloda 07/23/2018 Cycle 2 adjuvant Xeloda 08/13/2018 Cycle 3 adjuvant Xeloda 09/10/2018 (dose reduced to 1000 mg twice daily for 14 days)  Cycle 4 adjuvant Xeloda 10/01/2018 (dose increased to 1500 mg  every morning and 1000 mg  every afternoon for 14 days) Cycle 5 adjuvant Xeloda 10/22/2018 Cycle 6 adjuvant Xeloda 11/12/2018 Cycle 7 adjuvant Xeloda 12/03/2018 Cycle 8 adjuvant Xeloda 12/24/2018   5.  Anorexia/weight loss- resolved   6.  Undescended testicles   7.  Right renal mass.  CT 08/29/2017-right renal mass involving the lower pole was partially calcified and does demonstrate enhancement following contrast most consistent with renal cell carcinoma.  Lesion not significantly enlarged compared with the prior CT. Followed by urology. 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 to the IVC/RA junction, upper normal size retrocaval node 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, nuclear grade 3 Axitinib/pembrolizumab 10/22/2021 Axitinib placed on hold 11/05/2021 due to hand-foot syndrome Axitinib resumed at a dose of 5 mg daily 11/12/2021, pembrolizumab 11/12/2021 Axitinib discontinued 12/03/2021 secondary to progressive hand-foot syndrome, pembrolizumab 12/03/2021 Axitinib resumed at a reduced dose of 2 mg twice daily after office visit 12/14/2021 Pembrolizumab 12/24/2021 Axitinib stopped 01/03/2022 secondary to persistent hand/foot symptoms CT chest at [facility] 01/13/2022-no evidence of thoracic metastatic disease, similar diffuse sclerotic change in the skeleton MRI abdomen at [facility] 01/13/2022-similar size of right lower pole renal mass with renal vein thrombosis, decreased overall extent of thrombus now  extending to the intrahepatic IVC, enhancement of right renal mass and thrombus is decreased, new left hydroureter ureter nephrosis with asymmetric perinephric stranding Pembrolizumab 01/27/2022 Axitinib resumed 2 mg daily 01/28/2022 CT abdomen/pelvis at [facility] 02/10/2022-overall reduced size of right lower pole renal mass which demonstrates persistent region of enhancement along the posterior and lateral margin with extension into the renal sinus.  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  Query additional IVC thrombus at the intrahepatic IVC and extending into the right atrium. Axitinib continued 2 mg daily, Pembrolizumab every 3 weeks CT 05/05/2022 at [facility]-partially visualized pulmonary embolism involving segmental arteries of the right lower lobe.  Interval slight decrease in size of the mass arising from the lower pole the right kidney.  Mass demonstrates persistent moderate necrosis centrally.  Increased enhancing soft tissue component measuring up to 2 cm which is mildly increased in size compared to prior from 02/10/2022.  Bland tumor thrombus identified in the IVC at the level of the right renal vein ostium extending into the suprarenal IVC superiorly.  No obvious thrombus identified in the infrarenal, intrahepatic and suprahepatic IVC.  Likely trace bland thrombus in the anterior right renal vein.  Stable several subcentimeter retroperitoneal lymph nodes.  Diffuse osseous sclerotic and lytic metastatic disease stable compared to prior. Axitinib placed on hold 05/04/2022 due to pain bilateral feet; 05/13/2022 he will continue to hold axitinib Pembrolizumab 05/13/2022 Pembrolizumab 06/03/2022 Surgery scheduled 06/27/2022   8.  Diabetes   9.  Anemia-stool Hemoccults + March 2021, referred to [facility] for endoscopic evaluation, anemia also secondary to metastatic prostate cancer, hypogonadism Small bowel enteroscopy 02/11/2020-esophagus normal.  Hematin found in the gastric body.   Examined duodenum normal.  No evidence of significant pathology in the entire examined portion of the jejunum.  There was no  overt source of bleeding or inflammation.  The bleeding may be from small AVMs.  Surgical anastomosis was not visible with repeated inspection of the duodenum. Colonoscopy 02/11/2020-multiple sessile and semipedunculated polyps were found in the rectum, descending colon and ascending colon, 3 to 18 mm in size.  Polyps removed (polypectomy descending colon-fragments of tubular adenoma, inflammatory type polyp; polypectomy ascending colon-tubular adenoma, no high-grade dysplasia or malignancy; polypectomy descending colon-fragments of tubulovillous adenoma, no high-grade dysplasia or malignancy; polypectomy colon, rectum-tubular adenoma, no high-grade dysplasia or malignancy). Colonoscopy 02/02/2021-seven 2 to 10 mm polyps in the sigmoid colon (tubular adenoma), descending colon (fragments of tubulovillous adenoma, fragments of tubular adenoma ) and transverse colon (fragments of tubular adenoma). Progressive anemia 07/05/2021; stool cards positive Upper endoscopy/colonoscopy 09/23/2021-multiple polyps removed from the colon-tubular adenomas, diffuse angioectasias in the stomach with bleeding-not amenable to endoscopic treatment 10.  Renal failure-progressive 01/17/2022 11.  CT chest at [facility] 01/13/2022-no evidence of thoracic metastatic disease, similar diffuse sclerotic changes in the skeleton 12.  CT renal stone study 01/14/2022-mild left side hydroureteronephrosis secondary to a left UVJ stone, left nephrolithiasis, new right middle lobe collapse/consolidation, edema/fluid at the left iliopsoas muscle 13.  PE noted on CT 05/05/2022-on apixaban.  He saw Dr. Joan Flores at [facility] 05/16/2022; instructions are to hold apixaban 72 hours prior to scheduled surgery and start Lovenox at prophylactic dosing.    Disposition: Mr. Elza appears stable.  He is currently receiving pembrolizumab every 3 weeks.   He will return for Pembrolizumab as scheduled on 06/03/2022.  He is scheduled for the kidney surgery 06/27/2022.  He will return for follow-up here a few weeks after surgery.  CBC from today reviewed.  Hemoglobin is stable at 9.1.  He plans to contact Dr. Althea Charon office for more information regarding the Lovenox injections.    Ned Card ANP/GNP-BC   05/27/2022  10:10 AM

## 2022-05-28 ENCOUNTER — Other Ambulatory Visit: Payer: Self-pay

## 2022-05-28 LAB — SAMPLE TO BLOOD BANK

## 2022-05-30 ENCOUNTER — Other Ambulatory Visit: Payer: Self-pay | Admitting: Oncology

## 2022-05-31 ENCOUNTER — Other Ambulatory Visit: Payer: Self-pay | Admitting: Oncology

## 2022-05-31 ENCOUNTER — Other Ambulatory Visit (HOSPITAL_COMMUNITY): Payer: Self-pay

## 2022-06-03 ENCOUNTER — Inpatient Hospital Stay: Payer: Medicare Other

## 2022-06-03 VITALS — BP 128/73 | HR 64 | Temp 97.9°F | Resp 19 | Ht 65.0 in | Wt 154.6 lb

## 2022-06-03 DIAGNOSIS — Z5112 Encounter for antineoplastic immunotherapy: Secondary | ICD-10-CM | POA: Diagnosis not present

## 2022-06-03 DIAGNOSIS — C61 Malignant neoplasm of prostate: Secondary | ICD-10-CM

## 2022-06-03 DIAGNOSIS — C179 Malignant neoplasm of small intestine, unspecified: Secondary | ICD-10-CM

## 2022-06-03 DIAGNOSIS — C641 Malignant neoplasm of right kidney, except renal pelvis: Secondary | ICD-10-CM

## 2022-06-03 LAB — CBC WITH DIFFERENTIAL (CANCER CENTER ONLY)
Abs Immature Granulocytes: 0.04 10*3/uL (ref 0.00–0.07)
Basophils Absolute: 0 10*3/uL (ref 0.0–0.1)
Basophils Relative: 1 %
Eosinophils Absolute: 0.1 10*3/uL (ref 0.0–0.5)
Eosinophils Relative: 2 %
HCT: 28.9 % — ABNORMAL LOW (ref 39.0–52.0)
Hemoglobin: 8.8 g/dL — ABNORMAL LOW (ref 13.0–17.0)
Immature Granulocytes: 1 %
Lymphocytes Relative: 21 %
Lymphs Abs: 1.3 10*3/uL (ref 0.7–4.0)
MCH: 30.4 pg (ref 26.0–34.0)
MCHC: 30.4 g/dL (ref 30.0–36.0)
MCV: 100 fL (ref 80.0–100.0)
Monocytes Absolute: 0.5 10*3/uL (ref 0.1–1.0)
Monocytes Relative: 8 %
Neutro Abs: 4.3 10*3/uL (ref 1.7–7.7)
Neutrophils Relative %: 67 %
Platelet Count: 215 10*3/uL (ref 150–400)
RBC: 2.89 MIL/uL — ABNORMAL LOW (ref 4.22–5.81)
RDW: 14.7 % (ref 11.5–15.5)
WBC Count: 6.4 10*3/uL (ref 4.0–10.5)
nRBC: 0 % (ref 0.0–0.2)

## 2022-06-03 LAB — CMP (CANCER CENTER ONLY)
ALT: 10 U/L (ref 0–44)
AST: 12 U/L — ABNORMAL LOW (ref 15–41)
Albumin: 4 g/dL (ref 3.5–5.0)
Alkaline Phosphatase: 47 U/L (ref 38–126)
Anion gap: 10 (ref 5–15)
BUN: 31 mg/dL — ABNORMAL HIGH (ref 8–23)
CO2: 24 mmol/L (ref 22–32)
Calcium: 9.3 mg/dL (ref 8.9–10.3)
Chloride: 109 mmol/L (ref 98–111)
Creatinine: 1.6 mg/dL — ABNORMAL HIGH (ref 0.61–1.24)
GFR, Estimated: 48 mL/min — ABNORMAL LOW (ref >60)
Glucose, Bld: 179 mg/dL — ABNORMAL HIGH (ref 70–99)
Potassium: 3.8 mmol/L (ref 3.5–5.1)
Sodium: 143 mmol/L (ref 135–145)
Total Bilirubin: 0.4 mg/dL (ref 0.3–1.2)
Total Protein: 6.6 g/dL (ref 6.5–8.1)

## 2022-06-03 LAB — TSH: TSH: 2.13 u[IU]/mL (ref 0.350–4.500)

## 2022-06-03 MED ORDER — SODIUM CHLORIDE 0.9 % IV SOLN
200.0000 mg | Freq: Once | INTRAVENOUS | Status: AC
  Administered 2022-06-03: 200 mg via INTRAVENOUS
  Filled 2022-06-03: qty 8

## 2022-06-03 MED ORDER — SODIUM CHLORIDE 0.9 % IV SOLN: Freq: Once | INTRAVENOUS | Status: AC

## 2022-06-03 NOTE — Progress Notes (Signed)
Per Ned Card, NP: OK to treat today w/creatinine 1.60

## 2022-06-03 NOTE — Patient Instructions (Signed)
[facility]   Discharge Instructions: Thank you for choosing [facility] to provide your oncology and hematology care.   If you have a lab appointment with the Cancer Center, please go directly to the Cancer Center and check in at the registration area.   Wear comfortable clothing and clothing appropriate for easy access to any Portacath or PICC line.   We strive to give you quality time with your provider. You may need to reschedule your appointment if you arrive late (15 or more minutes).  Arriving late affects you and other patients whose appointments are after yours.  Also, if you miss three or more appointments without notifying the office, you may be dismissed from the clinic at the provider's discretion.      For prescription refill requests, have your pharmacy contact our office and allow 72 hours for refills to be completed.    Today you received the following chemotherapy and/or immunotherapy agents Keytruda.      To help prevent nausea and vomiting after your treatment, we encourage you to take your nausea medication as directed.  BELOW ARE SYMPTOMS THAT SHOULD BE REPORTED IMMEDIATELY: *FEVER GREATER THAN 100.4 F (38 C) OR HIGHER *CHILLS OR SWEATING *NAUSEA AND VOMITING THAT IS NOT CONTROLLED WITH YOUR NAUSEA MEDICATION *UNUSUAL SHORTNESS OF BREATH *UNUSUAL BRUISING OR BLEEDING *URINARY PROBLEMS (pain or burning when urinating, or frequent urination) *BOWEL PROBLEMS (unusual diarrhea, constipation, pain near the anus) TENDERNESS IN MOUTH AND THROAT WITH OR WITHOUT PRESENCE OF ULCERS (sore throat, sores in mouth, or a toothache) UNUSUAL RASH, SWELLING OR PAIN  UNUSUAL VAGINAL DISCHARGE OR ITCHING   Items with * indicate a potential emergency and should be followed up as soon as possible or go to the Emergency Department if any problems should occur.  Please show the CHEMOTHERAPY ALERT CARD or IMMUNOTHERAPY ALERT CARD at check-in to  the Emergency Department and triage nurse.  Should you have questions after your visit or need to cancel or reschedule your appointment, please contact [facility]  Dept: [phone]  and follow the prompts.  Office hours are 8:00 a.m. to 4:30 p.m. Monday - Friday. Please note that voicemails left after 4:00 p.m. may not be returned until the following business day.  We are closed weekends and major holidays. You have access to a nurse at all times for urgent questions. Please call the main number to the clinic Dept: [phone] and follow the prompts.   For any non-urgent questions, you may also contact your provider using MyChart. We now offer e-Visits for anyone 18 and older to request care online for non-urgent symptoms. For details visit [email or web address].   Also download the MyChart app! Go to the app store, search "MyChart", open the app, select , and log in with your MyChart username and password.  Masks are optional in the cancer centers. If you would like for your care team to wear a mask while they are taking care of you, please let them know. You may have one support person who is at least 65 years old accompany you for your appointments.  Pembrolizumab Injection What is this medication? PEMBROLIZUMAB (PEM broe LIZ ue mab) treats some types of cancer. It works by helping your immune system slow or stop the spread of cancer cells. It is a monoclonal antibody. This medicine may be used for other purposes; ask your health care provider or pharmacist if you have questions. COMMON BRAND NAME(S): Keytruda   What should I tell my care team before I take this medication? They need to know if you have any of these conditions: Allogeneic stem cell transplant (uses someone else's stem cells) Autoimmune diseases, such as Crohn disease, ulcerative colitis, lupus History of chest radiation Nervous system problems, such as Guillain-Barre syndrome,  myasthenia gravis Organ transplant An unusual or allergic reaction to pembrolizumab, other medications, foods, dyes, or preservatives Pregnant or trying to get pregnant Breast-feeding How should I use this medication? This medication is injected into a vein. It is given by your care team in a hospital or clinic setting. A special MedGuide will be given to you before each treatment. Be sure to read this information carefully each time. Talk to your care team about the use of this medication in children. While it may be prescribed for children as young as 6 months for selected conditions, precautions do apply. Overdosage: If you think you have taken too much of this medicine contact a poison control center or emergency room at once. NOTE: This medicine is only for you. Do not share this medicine with others. What if I miss a dose? Keep appointments for follow-up doses. It is important not to miss your dose. Call your care team if you are unable to keep an appointment. What may interact with this medication? Interactions have not been studied. This list may not describe all possible interactions. Give your health care provider a list of all the medicines, herbs, non-prescription drugs, or dietary supplements you use. Also tell them if you smoke, drink alcohol, or use illegal drugs. Some items may interact with your medicine. What should I watch for while using this medication? Your condition will be monitored carefully while you are receiving this medication. You may need blood work while taking this medication. This medication may cause serious skin reactions. They can happen weeks to months after starting the medication. Contact your care team right away if you notice fevers or flu-like symptoms with a rash. The rash may be red or purple and then turn into blisters or peeling of the skin. You may also notice a red rash with swelling of the face, lips, or lymph nodes in your neck or under your  arms. Tell your care team right away if you have any change in your eyesight. Talk to your care team if you may be pregnant. Serious birth defects can occur if you take this medication during pregnancy and for 4 months after the last dose. You will need a negative pregnancy test before starting this medication. Contraception is recommended while taking this medication and for 4 months after the last dose. Your care team can help you find the option that works for you. Do not breastfeed while taking this medication and for 4 months after the last dose. What side effects may I notice from receiving this medication? Side effects that you should report to your care team as soon as possible: Allergic reactions--skin rash, itching, hives, swelling of the face, lips, tongue, or throat Dry cough, shortness of breath or trouble breathing Eye pain, redness, irritation, or discharge with blurry or decreased vision Heart muscle inflammation--unusual weakness or fatigue, shortness of breath, chest pain, fast or irregular heartbeat, dizziness, swelling of the ankles, feet, or hands Hormone gland problems--headache, sensitivity to light, unusual weakness or fatigue, dizziness, fast or irregular heartbeat, increased sensitivity to cold or heat, excessive sweating, constipation, hair loss, increased thirst or amount of urine, tremors or shaking, irritability Infusion reactions--chest pain, shortness of   breath or trouble breathing, feeling faint or lightheaded Kidney injury (glomerulonephritis)--decrease in the amount of urine, red or dark brown urine, foamy or bubbly urine, swelling of the ankles, hands, or feet Liver injury--right upper belly pain, loss of appetite, nausea, light-colored stool, dark yellow or brown urine, yellowing skin or eyes, unusual weakness or fatigue Pain, tingling, or numbness in the hands or feet, muscle weakness, change in vision, confusion or trouble speaking, loss of balance or  coordination, trouble walking, seizures Rash, fever, and swollen lymph nodes Redness, blistering, peeling, or loosening of the skin, including inside the mouth Sudden or severe stomach pain, bloody diarrhea, fever, nausea, vomiting Side effects that usually do not require medical attention (report to your care team if they continue or are bothersome): Bone, joint, or muscle pain Diarrhea Fatigue Loss of appetite Nausea Skin rash This list may not describe all possible side effects. Call your doctor for medical advice about side effects. You may report side effects to FDA at 1-800-FDA-1088. Where should I keep my medication? This medication is given in a hospital or clinic. It will not be stored at home. NOTE: This sheet is a summary. It may not cover all possible information. If you have questions about this medicine, talk to your doctor, pharmacist, or health care provider.  2023 Elsevier/Gold Standard (2022-01-03 00:00:00) 

## 2022-06-06 ENCOUNTER — Other Ambulatory Visit: Payer: Self-pay | Admitting: Oncology

## 2022-06-14 ENCOUNTER — Other Ambulatory Visit: Payer: Self-pay

## 2022-06-17 ENCOUNTER — Other Ambulatory Visit (HOSPITAL_COMMUNITY): Payer: Self-pay

## 2022-06-20 ENCOUNTER — Other Ambulatory Visit (HOSPITAL_COMMUNITY): Payer: Self-pay

## 2022-06-21 ENCOUNTER — Other Ambulatory Visit (HOSPITAL_COMMUNITY): Payer: Self-pay

## 2022-06-22 ENCOUNTER — Other Ambulatory Visit (HOSPITAL_COMMUNITY): Payer: Self-pay

## 2022-06-22 ENCOUNTER — Encounter: Payer: Self-pay | Admitting: Oncology

## 2022-07-04 ENCOUNTER — Other Ambulatory Visit (HOSPITAL_COMMUNITY): Payer: Self-pay

## 2022-07-13 ENCOUNTER — Inpatient Hospital Stay: Payer: Medicare Other | Admitting: Oncology

## 2022-07-13 ENCOUNTER — Other Ambulatory Visit: Payer: Self-pay

## 2022-07-13 ENCOUNTER — Inpatient Hospital Stay: Payer: Medicare Other

## 2022-07-14 ENCOUNTER — Other Ambulatory Visit (HOSPITAL_COMMUNITY): Payer: Self-pay

## 2022-07-18 ENCOUNTER — Other Ambulatory Visit (HOSPITAL_COMMUNITY): Payer: Self-pay

## 2022-07-19 DIAGNOSIS — K625 Hemorrhage of anus and rectum: Secondary | ICD-10-CM | POA: Insufficient documentation

## 2022-07-19 DIAGNOSIS — Z905 Acquired absence of kidney: Secondary | ICD-10-CM | POA: Insufficient documentation

## 2022-07-19 DIAGNOSIS — I2782 Chronic pulmonary embolism: Secondary | ICD-10-CM | POA: Insufficient documentation

## 2022-07-19 DIAGNOSIS — R55 Syncope and collapse: Secondary | ICD-10-CM | POA: Insufficient documentation

## 2022-07-19 DIAGNOSIS — E119 Type 2 diabetes mellitus without complications: Secondary | ICD-10-CM | POA: Insufficient documentation

## 2022-07-19 DIAGNOSIS — R600 Localized edema: Secondary | ICD-10-CM | POA: Insufficient documentation

## 2022-07-29 ENCOUNTER — Other Ambulatory Visit (HOSPITAL_COMMUNITY): Payer: Self-pay

## 2022-07-30 ENCOUNTER — Other Ambulatory Visit: Payer: Self-pay | Admitting: Oncology

## 2022-08-01 ENCOUNTER — Other Ambulatory Visit (HOSPITAL_COMMUNITY): Payer: Self-pay

## 2022-08-02 ENCOUNTER — Other Ambulatory Visit: Payer: Self-pay

## 2022-08-04 ENCOUNTER — Inpatient Hospital Stay: Payer: Medicare Other | Attending: Oncology

## 2022-08-04 ENCOUNTER — Telehealth: Payer: Self-pay | Admitting: *Deleted

## 2022-08-04 ENCOUNTER — Inpatient Hospital Stay: Payer: Medicare Other | Admitting: Oncology

## 2022-08-04 VITALS — BP 107/61 | HR 94 | Temp 98.1°F | Resp 18 | Ht 65.0 in | Wt 138.0 lb

## 2022-08-04 DIAGNOSIS — Z7901 Long term (current) use of anticoagulants: Secondary | ICD-10-CM | POA: Insufficient documentation

## 2022-08-04 DIAGNOSIS — I2699 Other pulmonary embolism without acute cor pulmonale: Secondary | ICD-10-CM | POA: Insufficient documentation

## 2022-08-04 DIAGNOSIS — C641 Malignant neoplasm of right kidney, except renal pelvis: Secondary | ICD-10-CM | POA: Diagnosis not present

## 2022-08-04 DIAGNOSIS — C7951 Secondary malignant neoplasm of bone: Secondary | ICD-10-CM | POA: Insufficient documentation

## 2022-08-04 DIAGNOSIS — E119 Type 2 diabetes mellitus without complications: Secondary | ICD-10-CM | POA: Diagnosis not present

## 2022-08-04 DIAGNOSIS — C179 Malignant neoplasm of small intestine, unspecified: Secondary | ICD-10-CM

## 2022-08-04 DIAGNOSIS — C61 Malignant neoplasm of prostate: Secondary | ICD-10-CM | POA: Insufficient documentation

## 2022-08-04 DIAGNOSIS — E291 Testicular hypofunction: Secondary | ICD-10-CM | POA: Insufficient documentation

## 2022-08-04 DIAGNOSIS — N19 Unspecified kidney failure: Secondary | ICD-10-CM | POA: Insufficient documentation

## 2022-08-04 DIAGNOSIS — Z905 Acquired absence of kidney: Secondary | ICD-10-CM | POA: Insufficient documentation

## 2022-08-04 DIAGNOSIS — I823 Embolism and thrombosis of renal vein: Secondary | ICD-10-CM | POA: Insufficient documentation

## 2022-08-04 LAB — CMP (CANCER CENTER ONLY)
ALT: 20 U/L (ref 0–44)
AST: 19 U/L (ref 15–41)
Albumin: 3.9 g/dL (ref 3.5–5.0)
Alkaline Phosphatase: 64 U/L (ref 38–126)
Anion gap: 12 (ref 5–15)
BUN: 45 mg/dL — ABNORMAL HIGH (ref 8–23)
CO2: 24 mmol/L (ref 22–32)
Calcium: 9.6 mg/dL (ref 8.9–10.3)
Chloride: 102 mmol/L (ref 98–111)
Creatinine: 2.56 mg/dL — ABNORMAL HIGH (ref 0.61–1.24)
GFR, Estimated: 27 mL/min — ABNORMAL LOW (ref >60)
Glucose, Bld: 187 mg/dL — ABNORMAL HIGH (ref 70–99)
Potassium: 3.4 mmol/L — ABNORMAL LOW (ref 3.5–5.1)
Sodium: 138 mmol/L (ref 135–145)
Total Bilirubin: 0.5 mg/dL (ref 0.3–1.2)
Total Protein: 7.3 g/dL (ref 6.5–8.1)

## 2022-08-04 LAB — CBC WITH DIFFERENTIAL (CANCER CENTER ONLY)
Abs Immature Granulocytes: 0.04 10*3/uL (ref 0.00–0.07)
Basophils Absolute: 0 10*3/uL (ref 0.0–0.1)
Basophils Relative: 0 %
Eosinophils Absolute: 0.1 10*3/uL (ref 0.0–0.5)
Eosinophils Relative: 2 %
HCT: 27.1 % — ABNORMAL LOW (ref 39.0–52.0)
Hemoglobin: 8.2 g/dL — ABNORMAL LOW (ref 13.0–17.0)
Immature Granulocytes: 1 %
Lymphocytes Relative: 17 %
Lymphs Abs: 0.9 10*3/uL (ref 0.7–4.0)
MCH: 28.7 pg (ref 26.0–34.0)
MCHC: 30.3 g/dL (ref 30.0–36.0)
MCV: 94.8 fL (ref 80.0–100.0)
Monocytes Absolute: 0.6 10*3/uL (ref 0.1–1.0)
Monocytes Relative: 12 %
Neutro Abs: 3.7 10*3/uL (ref 1.7–7.7)
Neutrophils Relative %: 68 %
Platelet Count: 271 10*3/uL (ref 150–400)
RBC: 2.86 MIL/uL — ABNORMAL LOW (ref 4.22–5.81)
RDW: 15.5 % (ref 11.5–15.5)
WBC Count: 5.4 10*3/uL (ref 4.0–10.5)
nRBC: 0 % (ref 0.0–0.2)

## 2022-08-04 NOTE — Telephone Encounter (Signed)
Informed patient that Dr. Benay Spice spoke w/Dr. Tonny Branch and was told that he requires no further axitinib or keytruda. He is working on getting him a renal specialist at [facility] as well.

## 2022-08-04 NOTE — Progress Notes (Signed)
[facility] OFFICE PROGRESS NOTE   Diagnosis: Renal cell carcinoma, prostate cancer  INTERVAL HISTORY:   [name] underwent a robotic right radical nephrectomy and IVC thrombectomy at [facility] on 06/27/2022.  He continues to recover from surgery.  The right flank drain remains in place.  He reports minimal drainage at present.  He does not have significant pain.  No difficulty with bowel or bladder function.  He complains of lower leg swelling. The pathology from the 06/27/2022 surgery revealed no malignancy in 3 lymph nodes, tumor necrosis, hemorrhage, and fibrosis within the kidney.  Adherent thrombus with cellular necrosis and no viable tumor at the renal vein margin.    Objective:  Vital signs in last 24 hours:  Blood pressure 107/61, pulse 94, temperature 98.1 F (36.7 C), resp. rate 18, height _0  (1.651 m), weight 138 lb (62.6 kg), SpO2 100 %.    Resp: Decreased breath sounds at the right lower posterior chest, no respiratory distress Cardio: Regular rate and rhythm no hepatosplenomegaly, no mass, healed surgical incisions, tender in the right upper abdomen Neuro: Pitting edema to lower leg bilaterally Skin: Palms without erythema  Lab Results:  Lab Results  Component Value Date   WBC 5.4 08/04/2022   HGB 8.2 (L) 08/04/2022   HCT 27.1 (L) 08/04/2022   MCV 94.8 08/04/2022   PLT 271 08/04/2022   NEUTROABS 3.7 08/04/2022    CMP  Lab Results  Component Value Date   NA 138 08/04/2022   K 3.4 (L) 08/04/2022   CL 102 08/04/2022   CO2 24 08/04/2022   GLUCOSE 187 (H) 08/04/2022   BUN 45 (H) 08/04/2022   CREATININE 2.56 (H) 08/04/2022   CALCIUM 9.6 08/04/2022   PROT 7.3 08/04/2022   ALBUMIN 3.9 08/04/2022   AST 19 08/04/2022   ALT 20 08/04/2022   ALKPHOS 64 08/04/2022   BILITOT 0.5 08/04/2022   GFRNONAA 27 (L) 08/04/2022   GFRAA >60 03/05/2020    Lab Results  Component Value Date   CEA1 3.31 05/03/2019    Medications: I have reviewed the  patient's current medications.   Assessment/Plan: History of Severe anemia-status post a red cell transfusion 03/04/2016 2.. Prostate cancer Extensive sclerotic bone metastases noted on a CT of the abdomen/pelvis 03/04/2016 and MRI abdomen 03/05/2016 Markedly elevated PSA Lupron initiated 03/15/2016; Casodex 14 days beginning 03/15/2016 Prostate biopsy 03/18/2016 05/09/2016 PSA significantly improved at 21 Abiraterone initiated 05/10/2016 PSA less than 0.1 07/10/2018 PSA less than 0.1 08/30/2018 PSA less than 0.1 10/18/2018 PSA less than 0.1 11/29/2018 PSA less than 0.1 on 11/06/2019  PSA less than 0.1 on 03/05/2020 PSA less than 0.1 on 07/06/2020 PSA less than 0.1 08/17/2021 PSA less than 0.1 on 09/30/2021 PSA 0.1 on 11/12/2021 PSA 0.1 on 02/18/2022   3.   Symptoms of obstructive uropathy. Improved   4.   Distal duodenal and descending colon masses biopsy of the duodenal mass 03/04/2016 confirmed fragments of small bowel mucosa with focal high-grade dysplasia Biopsy of the descending colon "polyps" revealed fragments of a tubulovillous adenoma CT 08/29/2017-similar soft tissue thickening in the region of the splenic flexure of the colon suboptimally evaluated due to lack of colonic enteric contrast Referred to Dr. Benson Peters Referred to [facility] Resection of descending colon polyps 01/31/2018, tubulovillous adenomas with high-grade dysplasia Attempted endoscopic resection of the fourth segment duodenal "polyp" on 05/09/2018, firm unresectable lesion, biopsy revealed moderately differentiated adenocarcinoma CT abdomen/pelvis 05/25/2018- heterogeneously enhancing right lower pole renal lesion; diffuse sclerotic osseous metastatic lesions  compatible with history of prostate cancer CT chest 05/25/2018- no pulmonary metastasis.  Diffuse osseous metastasis involving the axial and appendicular skeleton. 06/19/2018-sleeve resection of third and fourth portions of duodenum with primary end-to-end anastomosis,  feeding gastrojejunostomy tube placement, Dr. Cloyd Peters; poorly differentiated adenocarcinoma duodenum, 3 cm; tumor invades the muscularis propria; lymphovascular invasion present; negative margins; 4 of 11 involved lymph nodes; soft tissue deposits also seen in the mesentery; all mucosal margins and soft tissue margins negative but 2 of the positive nodes are found in the soft tissue margin submitted for frozen section (pT2 pN2); second mucosal lesion 1.2 cm from the proximal margin, tubular adenoma with focal severe dysplasia Intact expression of mismatch repair proteins by IHC; microsatellite stable Cycle 1 adjuvant Xeloda 07/23/2018 Cycle 2 adjuvant Xeloda 08/13/2018 Cycle 3 adjuvant Xeloda 09/10/2018 (dose reduced to 1000 mg twice daily for 14 days)  Cycle 4 adjuvant Xeloda 10/01/2018 (dose increased to 1500 mg every morning and 1000 mg every afternoon for 14 days) Cycle 5 adjuvant Xeloda 10/22/2018 Cycle 6 adjuvant Xeloda 11/12/2018 Cycle 7 adjuvant Xeloda 12/03/2018 Cycle 8 adjuvant Xeloda 12/24/2018   5.  Anorexia/weight loss- resolved   6.  Undescended testicles   7.  Right renal mass.  CT 08/29/2017-right renal mass involving the lower pole was partially calcified and does demonstrate enhancement following contrast most consistent with renal cell carcinoma.  Lesion not significantly enlarged compared with the prior CT. Followed by urology. 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 to the  IVC/RA junction, upper normal size retrocaval node 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, nuclear grade 3 Axitinib/pembrolizumab 10/22/2021 Axitinib placed on hold 11/05/2021 due to hand-foot syndrome Axitinib resumed at a dose of 5 mg daily 11/12/2021, pembrolizumab 11/12/2021 Axitinib discontinued 12/03/2021 secondary to progressive hand-foot syndrome, pembrolizumab 12/03/2021 Axitinib resumed at a reduced dose of 2 mg twice daily after office visit 12/14/2021 Pembrolizumab 12/24/2021 Axitinib stopped 01/03/2022 secondary to persistent hand/foot symptoms CT chest at [facility] 01/13/2022-no evidence of thoracic metastatic disease, similar diffuse sclerotic change in the skeleton MRI abdomen at [facility] 01/13/2022-similar size of right lower pole renal mass with renal vein thrombosis, decreased overall extent of thrombus now extending to the intrahepatic IVC, enhancement of right renal mass and thrombus is decreased, new left hydroureter ureter nephrosis with asymmetric perinephric stranding Pembrolizumab 01/27/2022 Axitinib resumed 2 mg daily 01/28/2022 CT abdomen/pelvis at [facility] 02/10/2022-overall reduced size of right lower pole renal mass which demonstrates persistent region of enhancement along the posterior and lateral margin with extension into the renal sinus.  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  Query additional IVC thrombus at the intrahepatic IVC and extending into the right atrium. Axitinib continued 2 mg daily, Pembrolizumab every 3 weeks CT 05/05/2022 at [facility]-partially visualized pulmonary embolism involving segmental arteries of the right lower lobe.  Interval slight decrease in size of the mass arising from the lower pole the right kidney.  Mass demonstrates persistent moderate necrosis centrally.  Increased enhancing soft tissue component measuring up to 2 cm which is mildly increased in  size compared to prior from 02/10/2022.  Bland tumor thrombus  identified in the IVC at the level of the right renal vein ostium extending into the suprarenal IVC superiorly.  No obvious thrombus identified in the infrarenal, intrahepatic and suprahepatic IVC.  Likely trace bland thrombus in the anterior right renal vein.  Stable several subcentimeter retroperitoneal lymph nodes.  Diffuse osseous sclerotic and lytic metastatic disease stable compared to prior. Axitinib placed on hold 05/04/2022 due to pain bilateral feet; 05/13/2022 he will continue to hold axitinib Pembrolizumab 05/13/2022 Pembrolizumab 06/03/2022 Robotic right radical nephrectomy and IVC thrombectomy 06/27/2022-right kidney with tumor necrosis/hemorrhage,ypT0, adherent thrombus with cellular necrosis and no viable carcinoma present at the renal vein margin, other margins negative for carcinoma, 0/3 lymph nodes.  6.5 cm tumor including intraparenchymal lesion and thrombus in the renal vein 8.  Diabetes   9.  Anemia-stool Hemoccults + March 2021, referred to Dr. Benson Peters for endoscopic evaluation, anemia also secondary to metastatic prostate cancer, hypogonadism Small bowel enteroscopy 02/11/2020-esophagus normal.  Hematin found in the gastric body.  Examined duodenum normal.  No evidence of significant pathology in the entire examined portion of the jejunum.  There was no overt source of bleeding or inflammation.  The bleeding may be from small AVMs.  Surgical anastomosis was not visible with repeated inspection of the duodenum. Colonoscopy 02/11/2020-multiple sessile and semipedunculated polyps were found in the rectum, descending colon and ascending colon, 3 to 18 mm in size.  Polyps removed (polypectomy descending colon-fragments of tubular adenoma, inflammatory type polyp; polypectomy ascending colon-tubular adenoma, no high-grade dysplasia or malignancy; polypectomy descending colon-fragments of tubulovillous adenoma, no high-grade dysplasia or malignancy; polypectomy colon, rectum-tubular adenoma, no  high-grade dysplasia or malignancy). Colonoscopy 02/02/2021-seven 2 to 10 mm polyps in the sigmoid colon (tubular adenoma), descending colon (fragments of tubulovillous adenoma, fragments of tubular adenoma ) and transverse colon (fragments of tubular adenoma). Progressive anemia 07/05/2021; stool cards positive Upper endoscopy/colonoscopy 09/23/2021-multiple polyps removed from the colon-tubular adenomas, diffuse angioectasias in the stomach with bleeding-not amenable to endoscopic treatment 10.  Renal failure-progressive 01/17/2022, progressive following right nephrectomy October 2023 11.  CT chest at [facility] 01/13/2022-no evidence of thoracic metastatic disease, similar diffuse sclerotic changes in the skeleton 12.  CT renal stone study 01/14/2022-mild left side hydroureteronephrosis secondary to a left UVJ stone, left nephrolithiasis, new right middle lobe collapse/consolidation, edema/fluid at the left iliopsoas muscle 13.  PE noted on CT 05/05/2022-on apixaban.  He saw Dr. Joan Flores at [facility] 05/16/2022; instructions are to hold apixaban 72 hours prior to scheduled surgery and start Lovenox at prophylactic dosing.      Disposition: Mr Peters is recovering from the nephrectomy procedure.  The surgical drain remains in place.  He reports minimal output from the drain at present.  He is scheduled to follow-up with urology at [facility] in approximately 2 weeks.  He has experienced a complete clinical response to the axitinib/pembrolizumab.  Axitinib was complicated by severe hand/foot syndrome.  I will consult with Dr.Milowsky regarding the indication for continuing pembrolizumab.  I recommended he elevate his legs and try support stockings for the lower extremity edema.  He will return for an office visit in 4 weeks.  We are available to see him sooner as needed.  He will continue follow-up with urology at [facility] for the renal insufficiency.  He may benefit from a nephrology consult.  [name], MD  08/04/2022   10:00 AM

## 2022-08-05 ENCOUNTER — Other Ambulatory Visit: Payer: Self-pay

## 2022-08-11 ENCOUNTER — Other Ambulatory Visit (HOSPITAL_COMMUNITY): Payer: Self-pay

## 2022-08-11 ENCOUNTER — Encounter: Payer: Self-pay | Admitting: Oncology

## 2022-08-18 ENCOUNTER — Other Ambulatory Visit: Payer: Self-pay

## 2022-08-19 ENCOUNTER — Other Ambulatory Visit: Payer: Self-pay | Admitting: Nurse Practitioner

## 2022-08-25 ENCOUNTER — Other Ambulatory Visit: Payer: Self-pay

## 2022-08-29 ENCOUNTER — Other Ambulatory Visit (HOSPITAL_COMMUNITY): Payer: Self-pay

## 2022-08-31 ENCOUNTER — Other Ambulatory Visit: Payer: Medicare Other

## 2022-08-31 ENCOUNTER — Ambulatory Visit: Payer: Medicare Other | Admitting: Nurse Practitioner

## 2022-09-01 ENCOUNTER — Inpatient Hospital Stay: Payer: Medicare Other

## 2022-09-01 ENCOUNTER — Inpatient Hospital Stay: Payer: Medicare Other | Admitting: Nurse Practitioner

## 2022-09-01 ENCOUNTER — Encounter: Payer: Self-pay | Admitting: Nurse Practitioner

## 2022-09-01 ENCOUNTER — Inpatient Hospital Stay: Payer: Medicare Other | Attending: Oncology

## 2022-09-01 ENCOUNTER — Other Ambulatory Visit: Payer: Self-pay

## 2022-09-01 VITALS — BP 105/67 | HR 92 | Temp 97.9°F | Resp 16 | Wt 127.2 lb

## 2022-09-01 DIAGNOSIS — C7951 Secondary malignant neoplasm of bone: Secondary | ICD-10-CM | POA: Diagnosis present

## 2022-09-01 DIAGNOSIS — C641 Malignant neoplasm of right kidney, except renal pelvis: Secondary | ICD-10-CM

## 2022-09-01 DIAGNOSIS — E119 Type 2 diabetes mellitus without complications: Secondary | ICD-10-CM | POA: Diagnosis not present

## 2022-09-01 DIAGNOSIS — Q5320 Undescended testicle, unspecified, bilateral: Secondary | ICD-10-CM | POA: Insufficient documentation

## 2022-09-01 DIAGNOSIS — D649 Anemia, unspecified: Secondary | ICD-10-CM | POA: Insufficient documentation

## 2022-09-01 DIAGNOSIS — C61 Malignant neoplasm of prostate: Secondary | ICD-10-CM | POA: Diagnosis present

## 2022-09-01 DIAGNOSIS — I2699 Other pulmonary embolism without acute cor pulmonale: Secondary | ICD-10-CM | POA: Insufficient documentation

## 2022-09-01 DIAGNOSIS — Z905 Acquired absence of kidney: Secondary | ICD-10-CM | POA: Insufficient documentation

## 2022-09-01 DIAGNOSIS — Z7901 Long term (current) use of anticoagulants: Secondary | ICD-10-CM | POA: Insufficient documentation

## 2022-09-01 LAB — CMP (CANCER CENTER ONLY)
ALT: 14 U/L (ref 0–44)
AST: 18 U/L (ref 15–41)
Albumin: 4.1 g/dL (ref 3.5–5.0)
Alkaline Phosphatase: 67 U/L (ref 38–126)
Anion gap: 17 — ABNORMAL HIGH (ref 5–15)
BUN: 51 mg/dL — ABNORMAL HIGH (ref 8–23)
CO2: 18 mmol/L — ABNORMAL LOW (ref 22–32)
Calcium: 9.2 mg/dL (ref 8.9–10.3)
Chloride: 104 mmol/L (ref 98–111)
Creatinine: 4.25 mg/dL (ref 0.61–1.24)
GFR, Estimated: 15 mL/min — ABNORMAL LOW (ref >60)
Glucose, Bld: 296 mg/dL — ABNORMAL HIGH (ref 70–99)
Potassium: 3.9 mmol/L (ref 3.5–5.1)
Sodium: 139 mmol/L (ref 135–145)
Total Bilirubin: 0.7 mg/dL (ref 0.3–1.2)
Total Protein: 7.3 g/dL (ref 6.5–8.1)

## 2022-09-01 LAB — CBC WITH DIFFERENTIAL (CANCER CENTER ONLY)
Abs Immature Granulocytes: 0.04 10*3/uL (ref 0.00–0.07)
Basophils Absolute: 0 10*3/uL (ref 0.0–0.1)
Basophils Relative: 0 %
Eosinophils Absolute: 0 10*3/uL (ref 0.0–0.5)
Eosinophils Relative: 0 %
HCT: 26.2 % — ABNORMAL LOW (ref 39.0–52.0)
Hemoglobin: 8.3 g/dL — ABNORMAL LOW (ref 13.0–17.0)
Immature Granulocytes: 1 %
Lymphocytes Relative: 8 %
Lymphs Abs: 0.6 10*3/uL — ABNORMAL LOW (ref 0.7–4.0)
MCH: 30.5 pg (ref 26.0–34.0)
MCHC: 31.7 g/dL (ref 30.0–36.0)
MCV: 96.3 fL (ref 80.0–100.0)
Monocytes Absolute: 0.4 10*3/uL (ref 0.1–1.0)
Monocytes Relative: 5 %
Neutro Abs: 6.8 10*3/uL (ref 1.7–7.7)
Neutrophils Relative %: 86 %
Platelet Count: 202 10*3/uL (ref 150–400)
RBC: 2.72 MIL/uL — ABNORMAL LOW (ref 4.22–5.81)
RDW: 16 % — ABNORMAL HIGH (ref 11.5–15.5)
WBC Count: 7.9 10*3/uL (ref 4.0–10.5)
nRBC: 0 % (ref 0.0–0.2)

## 2022-09-01 MED ORDER — SODIUM CHLORIDE 0.9 % IV SOLN: INTRAVENOUS | Status: DC

## 2022-09-01 NOTE — Patient Instructions (Signed)
Dehydration, Adult Dehydration is a condition in which there is not enough water or other fluids in the body. This happens when a person loses more fluids than he or she takes in. Important organs, such as the kidneys, brain, and heart, cannot function without a proper amount of fluids. Any loss of fluids from the body can lead to dehydration. Dehydration can be mild, moderate, or severe. It should be treated right away to prevent it from becoming severe. What are the causes? Dehydration may be caused by: Conditions that cause loss of water or other fluids, such as diarrhea, vomiting, or sweating or urinating a lot. Not drinking enough fluids, especially when you are ill or doing activities that require a lot of energy. Other illnesses and conditions, such as fever or infection. Certain medicines, such as medicines that remove excess fluid from the body (diuretics). Lack of safe drinking water. Not being able to get enough water and food. What increases the risk? The following factors may make you more likely to develop this condition: Having a long-term (chronic) illness that has not been treated properly, such as diabetes, heart disease, or kidney disease. Being 65 years of age or older. Having a disability. Living in a place that is high in altitude, where thinner, drier air causes more fluid loss. Doing exercises that put stress on your body for a long time (endurance sports). What are the signs or symptoms? Symptoms of dehydration depend on how severe it is. Mild or moderate dehydration Thirst. Dry lips or dry mouth. Dizziness or light-headedness, especially when standing up from a seated position. Muscle cramps. Dark urine. Urine may be the color of tea. Less urine or tears produced than usual. Headache. Severe dehydration Changes in skin. Your skin may be cold and clammy, blotchy, or pale. Your skin also may not return to normal after being lightly pinched and released. Little or  no tears, urine, or sweat. Changes in vital signs, such as rapid breathing and low blood pressure. Your pulse may be weak or may be faster than 100 beats a minute when you are sitting still. Other changes, such as: Feeling very thirsty. Sunken eyes. Cold hands and feet. Confusion. Being very tired (lethargic) or having trouble waking from sleep. Short-term weight loss. Loss of consciousness. How is this diagnosed? This condition is diagnosed based on your symptoms and a physical exam. You may have blood and urine tests to help confirm the diagnosis. How is this treated? Treatment for this condition depends on how severe it is. Treatment should be started right away. Do not wait until dehydration becomes severe. Severe dehydration is an emergency and needs to be treated in a hospital. Mild or moderate dehydration can be treated at home. You may be asked to: Drink more fluids. Drink an oral rehydration solution (ORS). This drink helps restore proper amounts of fluids and salts and minerals in the blood (electrolytes). Severe dehydration can be treated: With IV fluids. By correcting abnormal levels of electrolytes. This is often done by giving electrolytes through a tube that is passed through your nose and into your stomach (nasogastric tube, or NG tube). By treating the underlying cause of dehydration. Follow these instructions at home: Oral rehydration solution If told by your health care provider, drink an ORS: Make an ORS by following instructions on the package. Start by drinking small amounts, about  cup (120 mL) every 5-10 minutes. Slowly increase how much you drink until you have taken the amount recommended by your health   care provider. Eating and drinking        Drink enough clear fluid to keep your urine pale yellow. If you were told to drink an ORS, finish the ORS first and then start slowly drinking other clear fluids. Drink fluids such as: Water. Do not drink only  water. Doing that can lead to hyponatremia, which is having too little salt (sodium) in the body. Water from ice chips you suck on. Fruit juice that you have added water to (diluted fruit juice). Low-calorie sports drinks. Eat foods that contain a healthy balance of electrolytes, such as bananas, oranges, potatoes, tomatoes, and spinach. Do not drink alcohol. Avoid the following: Drinks that contain a lot of sugar. These include high-calorie sports drinks, fruit juice that is not diluted, and soda. Caffeine. Foods that are greasy or contain a lot of fat or sugar. General instructions Take over-the-counter and prescription medicines only as told by your health care provider. Do not take sodium tablets. Doing that can lead to having too much sodium in the body (hypernatremia). Return to your normal activities as told by your health care provider. Ask your health care provider what activities are safe for you. Keep all follow-up visits as told by your health care provider. This is important. Contact a health care provider if: You have muscle cramps, pain, or discomfort, such as: Pain in your abdomen and the pain gets worse or stays in one area (localizes). Stiff neck. You have a rash. You are more irritable than usual. You are sleepier or have a harder time waking than usual. You feel weak or dizzy. You feel very thirsty. Get help right away if you have: Any symptoms of severe dehydration. Symptoms of vomiting, such as: You cannot eat or drink without vomiting. Vomiting gets worse or does not go away. Vomit includes blood or green matter (bile). Symptoms that get worse with treatment. A fever. A severe headache. Problems with urination or bowel movements, such as: Diarrhea that gets worse or does not go away. Blood in your stool (feces). This may cause stool to look black and tarry. Not urinating, or urinating only a small amount of very dark urine, within 6-8 hours. Trouble  breathing. These symptoms may represent a serious problem that is an emergency. Do not wait to see if the symptoms will go away. Get medical help right away. Call your local emergency services (911 in the [place] Do not drive yourself to the hospital. Summary Dehydration is a condition in which there is not enough water or other fluids in the body. This happens when a person loses more fluids than he or she takes in. Treatment for this condition depends on how severe it is. Treatment should be started right away. Do not wait until dehydration becomes severe. Drink enough clear fluid to keep your urine pale yellow. If you were told to drink an oral rehydration solution (ORS), finish the ORS first and then start slowly drinking other clear fluids. Take over-the-counter and prescription medicines only as told by your health care provider. Get help right away if you have any symptoms of severe dehydration. This information is not intended to replace advice given to you by your health care provider. Make sure you discuss any questions you have with your health care provider. Document Revised: 01/19/2022 Document Reviewed: 04/25/2019 Elsevier Patient Education  2023 [employer]  

## 2022-09-01 NOTE — Progress Notes (Signed)
[facility] OFFICE PROGRESS NOTE   Diagnosis: Renal cell carcinoma, prostate cancer  INTERVAL HISTORY:   Brandon Peters returns as scheduled.  He denies nausea/vomiting.  No mouth sores.  No diarrhea.  Appetite varies.  No lightheadedness or dizziness.  He feels fluid intake is adequate.  Objective:  Vital signs in last 24 hours:  Blood pressure 105/67, pulse 92, temperature 97.9 F (36.6 C), temperature source Temporal, resp. rate 16, weight 127 lb 3.2 oz (57.7 kg), SpO2 100 %.    HEENT: No thrush or ulcers.  Mucous membranes appear moist. Resp: Lungs clear bilaterally. Cardio: Regular rate and rhythm. GI: No hepatosplenomegaly. Vascular: Pitting edema lower leg bilaterally. Skin: Skin turgor mildly decreased.   Lab Results:  Lab Results  Component Value Date   WBC 7.9 09/01/2022   HGB 8.3 (L) 09/01/2022   HCT 26.2 (L) 09/01/2022   MCV 96.3 09/01/2022   PLT 202 09/01/2022   NEUTROABS 6.8 09/01/2022    Imaging:  No results found.  Medications: I have reviewed the patient's current medications.  Assessment/Plan: History of Severe anemia-status post a red cell transfusion 03/04/2016 2.. Prostate cancer Extensive sclerotic bone metastases noted on a CT of the abdomen/pelvis 03/04/2016 and MRI abdomen 03/05/2016 Markedly elevated PSA Lupron initiated 03/15/2016; Casodex 14 days beginning 03/15/2016 Prostate biopsy 03/18/2016 05/09/2016 PSA significantly improved at 21 Abiraterone initiated 05/10/2016 PSA less than 0.1 07/10/2018 PSA less than 0.1 08/30/2018 PSA less than 0.1 10/18/2018 PSA less than 0.1 11/29/2018 PSA less than 0.1 on 11/06/2019  PSA less than 0.1 on 03/05/2020 PSA less than 0.1 on 07/06/2020 PSA less than 0.1 08/17/2021 PSA less than 0.1 on 09/30/2021 PSA 0.1 on 11/12/2021 PSA 0.1 on 02/18/2022   3.   Symptoms of obstructive uropathy. Improved   4.   Distal duodenal and descending colon masses biopsy of the duodenal mass 03/04/2016  confirmed fragments of small bowel mucosa with focal high-grade dysplasia Biopsy of the descending colon "polyps" revealed fragments of a tubulovillous adenoma CT 08/29/2017-similar soft tissue thickening in the region of the splenic flexure of the colon suboptimally evaluated due to lack of colonic enteric contrast Referred to Dr. Benson Norway Referred to [facility] Resection of descending colon polyps 01/31/2018, tubulovillous adenomas with high-grade dysplasia Attempted endoscopic resection of the fourth segment duodenal "polyp" on 05/09/2018, firm unresectable lesion, biopsy revealed moderately differentiated adenocarcinoma CT abdomen/pelvis 05/25/2018- heterogeneously enhancing right lower pole renal lesion; diffuse sclerotic osseous metastatic lesions compatible with history of prostate cancer CT chest 05/25/2018- no pulmonary metastasis.  Diffuse osseous metastasis involving the axial and appendicular skeleton. 06/19/2018-sleeve resection of third and fourth portions of duodenum with primary end-to-end anastomosis, feeding gastrojejunostomy tube placement, Dr. Cloyd Stagers; poorly differentiated adenocarcinoma duodenum, 3 cm; tumor invades the muscularis propria; lymphovascular invasion present; negative margins; 4 of 11 involved lymph nodes; soft tissue deposits also seen in the mesentery; all mucosal margins and soft tissue margins negative but 2 of the positive nodes are found in the soft tissue margin submitted for frozen section (pT2 pN2); second mucosal lesion 1.2 cm from the proximal margin, tubular adenoma with focal severe dysplasia Intact expression of mismatch repair proteins by IHC; microsatellite stable Cycle 1 adjuvant Xeloda 07/23/2018 Cycle 2 adjuvant Xeloda 08/13/2018 Cycle 3 adjuvant Xeloda 09/10/2018 (dose reduced to 1000 mg twice daily for 14 days)  Cycle 4 adjuvant Xeloda 10/01/2018 (dose increased to 1500 mg every morning and 1000 mg every afternoon for 14 days) Cycle 5 adjuvant Xeloda 10/22/2018 Cycle  6 adjuvant Xeloda  11/12/2018 Cycle 7 adjuvant Xeloda 12/03/2018 Cycle 8 adjuvant Xeloda 12/24/2018   5.  Anorexia/weight loss- resolved   6.  Undescended testicles   7.  Right renal mass.  CT 08/29/2017-right renal mass involving the lower pole was partially calcified and does demonstrate enhancement following contrast most consistent with renal cell carcinoma.  Lesion not significantly enlarged compared with the prior CT. Followed by urology. 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 to the IVC/RA junction, upper normal size retrocaval node 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, nuclear grade 3 Axitinib/pembrolizumab 10/22/2021 Axitinib placed on hold 11/05/2021 due to hand-foot syndrome Axitinib resumed at a dose of 5 mg daily 11/12/2021, pembrolizumab 11/12/2021 Axitinib discontinued 12/03/2021 secondary to progressive hand-foot syndrome, pembrolizumab 12/03/2021 Axitinib resumed at a reduced dose of 2 mg twice daily after office visit 12/14/2021 Pembrolizumab 12/24/2021 Axitinib stopped 01/03/2022 secondary to persistent hand/foot symptoms CT chest [facility] 01/13/2022-no evidence of thoracic metastatic disease, similar diffuse sclerotic change in the skeleton MRI abdomen at [facility] 01/13/2022-similar size of right lower pole renal mass with renal vein thrombosis, decreased overall extent of thrombus now extending to the intrahepatic IVC, enhancement of right renal mass and  thrombus is decreased, new left hydroureter ureter nephrosis with asymmetric perinephric stranding Pembrolizumab 01/27/2022 Axitinib resumed 2 mg daily 01/28/2022 CT abdomen/pelvis at [facility] 02/10/2022-overall reduced size of right lower pole renal mass which demonstrates persistent region of enhancement along the posterior and lateral margin with extension into the renal sinus.  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  Query additional IVC thrombus at the intrahepatic IVC and extending into the right atrium. Axitinib continued 2 mg daily, Pembrolizumab every 3 weeks CT 05/05/2022 at [facility]-partially visualized pulmonary embolism involving segmental arteries of the right lower lobe.  Interval slight decrease in size of the mass arising from the lower pole the right kidney.  Mass demonstrates persistent moderate necrosis centrally.  Increased enhancing soft tissue component measuring up to 2 cm which is mildly increased in size compared to prior from 02/10/2022.  Bland tumor thrombus identified in the IVC at the level of the right renal vein ostium extending into the suprarenal IVC superiorly.  No obvious thrombus identified in the infrarenal, intrahepatic and suprahepatic IVC.  Likely trace bland thrombus in the anterior right renal vein.  Stable several subcentimeter retroperitoneal lymph nodes.  Diffuse osseous sclerotic and lytic metastatic disease stable compared to prior. Axitinib placed on hold 05/04/2022 due to pain bilateral feet; 05/13/2022 he will continue to hold axitinib Pembrolizumab 05/13/2022 Pembrolizumab 06/03/2022 Robotic right radical nephrectomy and IVC thrombectomy 06/27/2022-right kidney with tumor necrosis/hemorrhage,ypT0, adherent thrombus with cellular necrosis and no viable carcinoma present at the renal vein margin, other margins negative for carcinoma, 0/3 lymph nodes.  6.5 cm tumor including intraparenchymal lesion and thrombus in the renal vein 8.  Diabetes   9.   Anemia-stool Hemoccults + March 2021, referred to Dr. Benson Norway for endoscopic evaluation, anemia also secondary to metastatic prostate cancer, hypogonadism Small bowel enteroscopy 02/11/2020-esophagus normal.  Hematin found in the gastric  body.  Examined duodenum normal.  No evidence of significant pathology in the entire examined portion of the jejunum.  There was no overt source of bleeding or inflammation.  The bleeding may be from small AVMs.  Surgical anastomosis was not visible with repeated inspection of the duodenum. Colonoscopy 02/11/2020-multiple sessile and semipedunculated polyps were found in the rectum, descending colon and ascending colon, 3 to 18 mm in size.  Polyps removed (polypectomy descending colon-fragments of tubular adenoma, inflammatory type polyp; polypectomy ascending colon-tubular adenoma, no high-grade dysplasia or malignancy; polypectomy descending colon-fragments of tubulovillous adenoma, no high-grade dysplasia or malignancy; polypectomy colon, rectum-tubular adenoma, no high-grade dysplasia or malignancy). Colonoscopy 02/02/2021-seven 2 to 10 mm polyps in the sigmoid colon (tubular adenoma), descending colon (fragments of tubulovillous adenoma, fragments of tubular adenoma ) and transverse colon (fragments of tubular adenoma). Progressive anemia 07/05/2021; stool cards positive Upper endoscopy/colonoscopy 09/23/2021-multiple polyps removed from the colon-tubular adenomas, diffuse angioectasias in the stomach with bleeding-not amenable to endoscopic treatment 10.  Renal failure-progressive 01/17/2022, progressive following right nephrectomy October 2023 11.  CT chest at [facility] 01/13/2022-no evidence of thoracic metastatic disease, similar diffuse sclerotic changes in the skeleton 12.  CT renal stone study 01/14/2022-mild left side hydroureteronephrosis secondary to a left UVJ stone, left nephrolithiasis, new right middle lobe collapse/consolidation, edema/fluid at the left iliopsoas  muscle 13.  PE noted on CT 05/05/2022-on apixaban.  He saw Dr. Joan Flores at [facility] 05/16/2022; instructions are to hold apixaban 72 hours prior to scheduled surgery and start Lovenox at prophylactic dosing.  Disposition: Brandon Peters is seen today for routine follow-up.  Dr. Benay Spice discussed his case with Dr. Vito Berger after the last office visit.  No further systemic therapy is recommended.  We reviewed the CBC and chemistry panel from today.  Creatinine is higher.  He may be dehydrated.  He will receive a liter of IV fluids and return for a follow-up basic metabolic panel 37/02/2830.  He has been referred to nephrology, appointment 10/25/2022.  He will return for lab follow-up in 2 weeks.    Plan reviewed with Dr. Benay Spice.    Ned Card ANP/GNP-BC   09/01/2022  2:00 PM

## 2022-09-01 NOTE — Progress Notes (Signed)
CRITICAL VALUE STICKER  CRITICAL VALUE: Creatinine 4.25  RECEIVER (on-site recipient of call): Gean Birchwood, RN  DATE & TIME NOTIFIED: [phone]  MESSENGER (representative from lab): Tarry Kos  MD NOTIFIED: Ned Card, NP and MD Benay Spice  TIME OF NOTIFICATION: 1062  RESPONSE: No new orders at this time

## 2022-09-02 ENCOUNTER — Other Ambulatory Visit: Payer: Self-pay

## 2022-09-02 ENCOUNTER — Inpatient Hospital Stay: Payer: Medicare Other

## 2022-09-02 ENCOUNTER — Telehealth: Payer: Self-pay

## 2022-09-02 VITALS — BP 110/69 | HR 68 | Temp 97.5°F | Resp 18

## 2022-09-02 DIAGNOSIS — C641 Malignant neoplasm of right kidney, except renal pelvis: Secondary | ICD-10-CM

## 2022-09-02 DIAGNOSIS — C61 Malignant neoplasm of prostate: Secondary | ICD-10-CM | POA: Diagnosis not present

## 2022-09-02 LAB — BASIC METABOLIC PANEL - CANCER CENTER ONLY
Anion gap: 16 — ABNORMAL HIGH (ref 5–15)
BUN: 47 mg/dL — ABNORMAL HIGH (ref 8–23)
CO2: 19 mmol/L — ABNORMAL LOW (ref 22–32)
Calcium: 9.2 mg/dL (ref 8.9–10.3)
Chloride: 108 mmol/L (ref 98–111)
Creatinine: 3.98 mg/dL (ref 0.61–1.24)
GFR, Estimated: 16 mL/min — ABNORMAL LOW (ref >60)
Glucose, Bld: 209 mg/dL — ABNORMAL HIGH (ref 70–99)
Potassium: 3.5 mmol/L (ref 3.5–5.1)
Sodium: 143 mmol/L (ref 135–145)

## 2022-09-02 MED ORDER — SODIUM CHLORIDE 0.9 % IV SOLN: Freq: Once | INTRAVENOUS | Status: AC

## 2022-09-02 NOTE — Patient Instructions (Signed)

## 2022-09-02 NOTE — Telephone Encounter (Signed)
Received Critical Lab from [place] in Lab of Scr 3.98. Ned Card, NP made aware. Verbal order received to give patient IVF. Patient informed of lab value and agreed to stay for fluids. Dr. Benay Spice to reach out to nephrologist for further advisement.

## 2022-09-04 ENCOUNTER — Other Ambulatory Visit: Payer: Self-pay

## 2022-09-05 ENCOUNTER — Other Ambulatory Visit: Payer: Self-pay | Admitting: Nurse Practitioner

## 2022-09-05 ENCOUNTER — Inpatient Hospital Stay: Payer: Medicare Other

## 2022-09-05 DIAGNOSIS — C641 Malignant neoplasm of right kidney, except renal pelvis: Secondary | ICD-10-CM

## 2022-09-05 DIAGNOSIS — C61 Malignant neoplasm of prostate: Secondary | ICD-10-CM

## 2022-09-05 LAB — BASIC METABOLIC PANEL
Anion gap: 13 (ref 5–15)
BUN: 67 mg/dL — ABNORMAL HIGH (ref 8–23)
CO2: 17 mmol/L — ABNORMAL LOW (ref 22–32)
Calcium: 9.3 mg/dL (ref 8.9–10.3)
Chloride: 111 mmol/L (ref 98–111)
Creatinine, Ser: 3.98 mg/dL — ABNORMAL HIGH (ref 0.61–1.24)
GFR, Estimated: 16 mL/min — ABNORMAL LOW (ref >60)
Glucose, Bld: 241 mg/dL — ABNORMAL HIGH (ref 70–99)
Potassium: 4.2 mmol/L (ref 3.5–5.1)
Sodium: 141 mmol/L (ref 135–145)

## 2022-09-06 ENCOUNTER — Ambulatory Visit (HOSPITAL_BASED_OUTPATIENT_CLINIC_OR_DEPARTMENT_OTHER)
Admission: RE | Admit: 2022-09-06 | Discharge: 2022-09-06 | Disposition: A | Discharge status: Home / Self Care | Payer: Medicare Other | Source: Ambulatory Visit | Attending: Nurse Practitioner | Admitting: Nurse Practitioner

## 2022-09-06 DIAGNOSIS — C61 Malignant neoplasm of prostate: Secondary | ICD-10-CM | POA: Diagnosis present

## 2022-09-06 DIAGNOSIS — C641 Malignant neoplasm of right kidney, except renal pelvis: Secondary | ICD-10-CM | POA: Insufficient documentation

## 2022-09-07 ENCOUNTER — Encounter: Payer: Self-pay | Admitting: Oncology

## 2022-09-07 ENCOUNTER — Other Ambulatory Visit: Payer: Self-pay | Admitting: Oncology

## 2022-09-07 ENCOUNTER — Encounter: Payer: Self-pay | Admitting: *Deleted

## 2022-09-07 ENCOUNTER — Other Ambulatory Visit (HOSPITAL_COMMUNITY): Payer: Self-pay

## 2022-09-08 ENCOUNTER — Other Ambulatory Visit: Payer: Self-pay | Admitting: *Deleted

## 2022-09-08 DIAGNOSIS — C641 Malignant neoplasm of right kidney, except renal pelvis: Secondary | ICD-10-CM

## 2022-09-08 DIAGNOSIS — C61 Malignant neoplasm of prostate: Secondary | ICD-10-CM

## 2022-09-11 ENCOUNTER — Other Ambulatory Visit: Payer: Self-pay | Admitting: Oncology

## 2022-09-16 ENCOUNTER — Inpatient Hospital Stay: Payer: Medicare Other | Admitting: Oncology

## 2022-09-16 ENCOUNTER — Inpatient Hospital Stay: Payer: Medicare Other

## 2022-09-16 ENCOUNTER — Other Ambulatory Visit: Payer: Self-pay

## 2022-09-16 ENCOUNTER — Encounter: Payer: Self-pay | Admitting: Oncology

## 2022-09-16 VITALS — BP 113/63 | HR 79 | Temp 98.0°F | Resp 16 | Wt 137.6 lb

## 2022-09-16 DIAGNOSIS — D649 Anemia, unspecified: Secondary | ICD-10-CM

## 2022-09-16 DIAGNOSIS — C641 Malignant neoplasm of right kidney, except renal pelvis: Secondary | ICD-10-CM

## 2022-09-16 DIAGNOSIS — C61 Malignant neoplasm of prostate: Secondary | ICD-10-CM

## 2022-09-16 LAB — CBC WITH DIFFERENTIAL (CANCER CENTER ONLY)
Abs Immature Granulocytes: 0.04 10*3/uL (ref 0.00–0.07)
Basophils Absolute: 0 10*3/uL (ref 0.0–0.1)
Basophils Relative: 0 %
Eosinophils Absolute: 0.1 10*3/uL (ref 0.0–0.5)
Eosinophils Relative: 1 %
HCT: 22.3 % — ABNORMAL LOW (ref 39.0–52.0)
Hemoglobin: 6.6 g/dL — CL (ref 13.0–17.0)
Immature Granulocytes: 1 %
Lymphocytes Relative: 15 %
Lymphs Abs: 1.2 10*3/uL (ref 0.7–4.0)
MCH: 29.7 pg (ref 26.0–34.0)
MCHC: 29.6 g/dL — ABNORMAL LOW (ref 30.0–36.0)
MCV: 100.5 fL — ABNORMAL HIGH (ref 80.0–100.0)
Monocytes Absolute: 0.7 10*3/uL (ref 0.1–1.0)
Monocytes Relative: 8 %
Neutro Abs: 5.9 10*3/uL (ref 1.7–7.7)
Neutrophils Relative %: 75 %
Platelet Count: 205 10*3/uL (ref 150–400)
RBC: 2.22 MIL/uL — ABNORMAL LOW (ref 4.22–5.81)
RDW: 17.2 % — ABNORMAL HIGH (ref 11.5–15.5)
WBC Count: 7.9 10*3/uL (ref 4.0–10.5)
nRBC: 0 % (ref 0.0–0.2)

## 2022-09-16 LAB — SAMPLE TO BLOOD BANK

## 2022-09-16 LAB — CMP (CANCER CENTER ONLY)
ALT: 37 U/L (ref 0–44)
AST: 25 U/L (ref 15–41)
Albumin: 3.7 g/dL (ref 3.5–5.0)
Alkaline Phosphatase: 41 U/L (ref 38–126)
Anion gap: 13 (ref 5–15)
BUN: 44 mg/dL — ABNORMAL HIGH (ref 8–23)
CO2: 19 mmol/L — ABNORMAL LOW (ref 22–32)
Calcium: 8.9 mg/dL (ref 8.9–10.3)
Chloride: 111 mmol/L (ref 98–111)
Creatinine: 2.38 mg/dL — ABNORMAL HIGH (ref 0.61–1.24)
GFR, Estimated: 30 mL/min — ABNORMAL LOW (ref >60)
Glucose, Bld: 130 mg/dL — ABNORMAL HIGH (ref 70–99)
Potassium: 3.5 mmol/L (ref 3.5–5.1)
Sodium: 143 mmol/L (ref 135–145)
Total Bilirubin: 0.5 mg/dL (ref 0.3–1.2)
Total Protein: 6.5 g/dL (ref 6.5–8.1)

## 2022-09-16 LAB — PREPARE RBC (CROSSMATCH)

## 2022-09-16 MED ORDER — SODIUM CHLORIDE 0.9% IV SOLUTION: 250.0000 mL | Freq: Once | INTRAVENOUS | Status: DC

## 2022-09-16 NOTE — Progress Notes (Signed)
[place] OFFICE PROGRESS NOTE   Diagnosis: Renal cell carcinoma, prostate cancer  INTERVAL HISTORY:   Brandon Peters returns as scheduled.  He feels well.  No bleeding.  No dyspnea.  He has intermittent constipation.  He saw [facility] nephrology 09/07/2022.  He is scheduled for another appointment on 09/27/2022.  He reports being placed on a low-salt diet.  Furosemide and potassium were discontinued.  The BUN returned at 53 with a creatinine of 3.2.  Objective:  Vital signs in last 24 hours:  Blood pressure 113/63, pulse 79, temperature 98 F (36.7 C), temperature source Tympanic, resp. rate 16, weight 137 lb 9.6 oz (62.4 kg), SpO2 100 %.    Resp: Decreased breath sounds at the right lower posterior chest, no respiratory distress Cardio: Regular rate and rhythm GI: No hepatosplenomegaly, nontender Vascular: 1+ pitting edema of the lower leg bilaterally   Lab Results:  Lab Results  Component Value Date   WBC 7.9 09/16/2022   HGB 6.6 (LL) 09/16/2022   HCT 22.3 (L) 09/16/2022   MCV 100.5 (H) 09/16/2022   PLT 205 09/16/2022   NEUTROABS 5.9 09/16/2022    CMP  Lab Results  Component Value Date   NA 143 09/16/2022   K 3.5 09/16/2022   CL 111 09/16/2022   CO2 19 (L) 09/16/2022   GLUCOSE 130 (H) 09/16/2022   BUN 44 (H) 09/16/2022   CREATININE 2.38 (H) 09/16/2022   CALCIUM 8.9 09/16/2022   PROT 6.5 09/16/2022   ALBUMIN 3.7 09/16/2022   AST 25 09/16/2022   ALT 37 09/16/2022   ALKPHOS 41 09/16/2022   BILITOT 0.5 09/16/2022   GFRNONAA 30 (L) 09/16/2022   GFRAA >60 03/05/2020    Lab Results  Component Value Date   CEA1 3.31 05/03/2019    Lab Results  Component Value Date   INR 1.0 10/13/2021   LABPROT 12.9 10/13/2021    Imaging:  No results found.  Medications: I have reviewed the patient's current medications.   Assessment/Plan: History of Severe anemia-status post a red cell transfusion 03/04/2016 2.. Prostate cancer Extensive sclerotic bone  metastases noted on a CT of the abdomen/pelvis 03/04/2016 and MRI abdomen 03/05/2016 Markedly elevated PSA Lupron initiated 03/15/2016; Casodex 14 days beginning 03/15/2016 Prostate biopsy 03/18/2016 05/09/2016 PSA significantly improved at 21 Abiraterone initiated 05/10/2016 PSA less than 0.1 07/10/2018 PSA less than 0.1 08/30/2018 PSA less than 0.1 10/18/2018 PSA less than 0.1 11/29/2018 PSA less than 0.1 on 11/06/2019  PSA less than 0.1 on 03/05/2020 PSA less than 0.1 on 07/06/2020 PSA less than 0.1 08/17/2021 PSA less than 0.1 on 09/30/2021 PSA 0.1 on 11/12/2021 PSA 0.1 on 02/18/2022   3.   Symptoms of obstructive uropathy. Improved   4.   Distal duodenal and descending colon masses biopsy of the duodenal mass 03/04/2016 confirmed fragments of small bowel mucosa with focal high-grade dysplasia Biopsy of the descending colon "polyps" revealed fragments of a tubulovillous adenoma CT 08/29/2017-similar soft tissue thickening in the region of the splenic flexure of the colon suboptimally evaluated due to lack of colonic enteric contrast Referred to Dr. Benson Norway Referred to [facility] Resection of descending colon polyps 01/31/2018, tubulovillous adenomas with high-grade dysplasia Attempted endoscopic resection of the fourth segment duodenal "polyp" on 05/09/2018, firm unresectable lesion, biopsy revealed moderately differentiated adenocarcinoma CT abdomen/pelvis 05/25/2018- heterogeneously enhancing right lower pole renal lesion; diffuse sclerotic osseous metastatic lesions compatible with history of prostate cancer CT chest 05/25/2018- no pulmonary metastasis.  Diffuse osseous metastasis involving the axial and  appendicular skeleton. 06/19/2018-sleeve resection of third and fourth portions of duodenum with primary end-to-end anastomosis, feeding gastrojejunostomy tube placement, Dr. Cloyd Stagers; poorly differentiated adenocarcinoma duodenum, 3 cm; tumor invades the muscularis propria; lymphovascular invasion present;  negative margins; 4 of 11 involved lymph nodes; soft tissue deposits also seen in the mesentery; all mucosal margins and soft tissue margins negative but 2 of the positive nodes are found in the soft tissue margin submitted for frozen section (pT2 pN2); second mucosal lesion 1.2 cm from the proximal margin, tubular adenoma with focal severe dysplasia Intact expression of mismatch repair proteins by IHC; microsatellite stable Cycle 1 adjuvant Xeloda 07/23/2018 Cycle 2 adjuvant Xeloda 08/13/2018 Cycle 3 adjuvant Xeloda 09/10/2018 (dose reduced to 1000 mg twice daily for 14 days)  Cycle 4 adjuvant Xeloda 10/01/2018 (dose increased to 1500 mg every morning and 1000 mg every afternoon for 14 days) Cycle 5 adjuvant Xeloda 10/22/2018 Cycle 6 adjuvant Xeloda 11/12/2018 Cycle 7 adjuvant Xeloda 12/03/2018 Cycle 8 adjuvant Xeloda 12/24/2018   5.  Anorexia/weight loss- resolved   6.  Undescended testicles   7.  Right renal mass.  CT 08/29/2017-right renal mass involving the lower pole was partially calcified and does demonstrate enhancement following contrast most consistent with renal cell carcinoma.  Lesion not significantly enlarged compared with the prior CT. Followed by urology. 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 to the IVC/RA junction, upper normal size retrocaval node 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, nuclear grade 3 Axitinib/pembrolizumab  10/22/2021 Axitinib placed on hold 11/05/2021 due to hand-foot syndrome Axitinib resumed at a dose of 5 mg daily 11/12/2021, pembrolizumab 11/12/2021 Axitinib discontinued 12/03/2021 secondary to progressive hand-foot syndrome, pembrolizumab 12/03/2021 Axitinib resumed at a reduced dose of 2 mg twice daily after office visit 12/14/2021 Pembrolizumab 12/24/2021 Axitinib stopped 01/03/2022 secondary to persistent hand/foot symptoms CT chest at [facility] 01/13/2022-no evidence of thoracic metastatic disease, similar diffuse sclerotic change in the skeleton MRI abdomen at [facility] 01/13/2022-similar size of right lower pole renal mass with renal vein thrombosis, decreased overall extent of thrombus now extending to the intrahepatic IVC, enhancement of right renal mass and thrombus is decreased, new left hydroureter ureter nephrosis with asymmetric perinephric stranding Pembrolizumab 01/27/2022 Axitinib resumed 2 mg daily 01/28/2022 CT abdomen/pelvis at [facility] 02/10/2022-overall reduced size of right lower pole renal mass which demonstrates persistent region of enhancement along the posterior and lateral margin with extension into the renal sinus.  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  Query additional IVC thrombus at the intrahepatic IVC and extending into the right atrium. Axitinib continued 2 mg daily, Pembrolizumab every 3 weeks CT 05/05/2022 at [facility]-partially visualized pulmonary embolism involving segmental arteries of the right lower lobe.  Interval slight decrease in size of the mass arising from the lower pole the right kidney.  Mass demonstrates persistent moderate necrosis centrally.  Increased enhancing soft tissue component measuring up to 2 cm which is mildly increased in size compared to prior from 02/10/2022.  Bland tumor thrombus identified in the IVC at the level of the right  renal vein ostium extending into the suprarenal IVC superiorly.  No obvious thrombus identified in the infrarenal,  intrahepatic and suprahepatic IVC.  Likely trace bland thrombus in the anterior right renal vein.  Stable several subcentimeter retroperitoneal lymph nodes.  Diffuse osseous sclerotic and lytic metastatic disease stable compared to prior. Axitinib placed on hold 05/04/2022 due to pain bilateral feet; 05/13/2022 he will continue to hold axitinib Pembrolizumab 05/13/2022 Pembrolizumab 06/03/2022 Robotic right radical nephrectomy and IVC thrombectomy 06/27/2022-right kidney with tumor necrosis/hemorrhage,ypT0, adherent thrombus with cellular necrosis and no viable carcinoma present at the renal vein margin, other margins negative for carcinoma, 0/3 lymph nodes.  6.5 cm tumor including intraparenchymal lesion and thrombus in the renal vein 8.  Diabetes   9.  Anemia-stool Hemoccults + March 2021, referred to Dr. Benson Norway for endoscopic evaluation, anemia also secondary to metastatic prostate cancer, hypogonadism Small bowel enteroscopy 02/11/2020-esophagus normal.  Hematin found in the gastric body.  Examined duodenum normal.  No evidence of significant pathology in the entire examined portion of the jejunum.  There was no overt source of bleeding or inflammation.  The bleeding may be from small AVMs.  Surgical anastomosis was not visible with repeated inspection of the duodenum. Colonoscopy 02/11/2020-multiple sessile and semipedunculated polyps were found in the rectum, descending colon and ascending colon, 3 to 18 mm in size.  Polyps removed (polypectomy descending colon-fragments of tubular adenoma, inflammatory type polyp; polypectomy ascending colon-tubular adenoma, no high-grade dysplasia or malignancy; polypectomy descending colon-fragments of tubulovillous adenoma, no high-grade dysplasia or malignancy; polypectomy colon, rectum-tubular adenoma, no high-grade dysplasia or malignancy). Colonoscopy 02/02/2021-seven 2 to 10 mm polyps in the sigmoid colon (tubular adenoma), descending colon (fragments of tubulovillous  adenoma, fragments of tubular adenoma ) and transverse colon (fragments of tubular adenoma). Progressive anemia 07/05/2021; stool cards positive Upper endoscopy/colonoscopy 09/23/2021-multiple polyps removed from the colon-tubular adenomas, diffuse angioectasias in the stomach with bleeding-not amenable to endoscopic treatment 10.  Renal failure-progressive 01/17/2022, progressive following right nephrectomy October 2023 11.  CT chest at [facility] 01/13/2022-no evidence of thoracic metastatic disease, similar diffuse sclerotic changes in the skeleton 12.  CT renal stone study 01/14/2022-mild left side hydroureteronephrosis secondary to a left UVJ stone, left nephrolithiasis, new right middle lobe collapse/consolidation, edema/fluid at the left iliopsoas muscle 13.  PE noted on CT 05/05/2022-on apixaban.  He saw Dr. Joan Flores at [facility] 05/16/2022; instructions are to hold apixaban 72 hours prior to scheduled surgery and start Lovenox at prophylactic dosing.   Disposition: Brandon Peters appears well.  The creatinine has improved.  He will continue follow-up with the nephrology service at [facility]. He has severe anemia.  The anemia is likely secondary to chronic disease, renal insufficiency, and prostate cancer.  He will be transfused with 2 units of packed red blood cells today.  Brandon Peters continues abiraterone for treatment of prostate cancer.  He will receive Lupron when he returns for an office visit in 2 weeks.  I discussed the case with the medical oncology and urology services when he was here 09/01/2022.  [name], MD  09/16/2022  9:31 AM

## 2022-09-16 NOTE — Patient Instructions (Signed)
Blood Transfusion, Adult, Care After The following information offers guidance on how to care for yourself after your procedure. Your health care provider may also give you more specific instructions. If you have problems or questions, contact your health care provider. What can I expect after the procedure? After the procedure, it is common to have: Bruising and soreness where the IV was inserted. A headache. Follow these instructions at home: IV insertion site care     Follow instructions from your health care provider about how to take care of your IV insertion site. Make sure you: Wash your hands with soap and water for at least 20 seconds before and after you change your bandage (dressing). If soap and water are not available, use hand sanitizer. Change your dressing as told by your health care provider. Check your IV insertion site every day for signs of infection. Check for: Redness, swelling, or pain. Bleeding from the site. Warmth. Pus or a bad smell. General instructions Take over-the-counter and prescription medicines only as told by your health care provider. Rest as told by your health care provider. Return to your normal activities as told by your health care provider. Keep all follow-up visits. Lab tests may need to be done at certain periods to recheck your blood counts. Contact a health care provider if: You have itching or red, swollen areas of skin (hives). You have a fever or chills. You have pain in the head, back, or chest. You feel anxious or you feel weak after doing your normal activities. You have redness, swelling, warmth, or pain around the IV insertion site. You have blood coming from the IV insertion site that does not stop with pressure. You have pus or a bad smell coming from your IV insertion site. If you received your blood transfusion in an outpatient setting, you will be told whom to contact to report any reactions. Get help right away if: You  have symptoms of a serious allergic or immune system reaction, including: Trouble breathing or shortness of breath. Swelling of the face, feeling flushed, or widespread rash. Dark urine or blood in the urine. Fast heartbeat. These symptoms may be an emergency. Get help right away. Call 911. Do not wait to see if the symptoms will go away. Do not drive yourself to the hospital. Summary Bruising and soreness around the IV insertion site are common. Check your IV insertion site every day for signs of infection. Rest as told by your health care provider. Return to your normal activities as told by your health care provider. Get help right away for symptoms of a serious allergic or immune system reaction to the blood transfusion. This information is not intended to replace advice given to you by your health care provider. Make sure you discuss any questions you have with your health care provider. Document Revised: 12/10/2021 Document Reviewed: 12/10/2021 Elsevier Patient Education  2023 Elsevier Inc.  

## 2022-09-19 LAB — TYPE AND SCREEN
ABO/RH(D): O POS
Antibody Screen: NEGATIVE
Unit division: 0
Unit division: 0

## 2022-09-19 LAB — BPAM RBC
Blood Product Expiration Date: 202401242359
Blood Product Expiration Date: 202401242359
ISSUE DATE / TIME: 202312221209
ISSUE DATE / TIME: 202312221357
Unit Type and Rh: 5100
Unit Type and Rh: 5100

## 2022-09-29 ENCOUNTER — Other Ambulatory Visit (HOSPITAL_COMMUNITY): Payer: Self-pay

## 2022-09-30 ENCOUNTER — Other Ambulatory Visit: Payer: Self-pay

## 2022-09-30 ENCOUNTER — Inpatient Hospital Stay: Payer: Medicare Other | Admitting: Nurse Practitioner

## 2022-09-30 ENCOUNTER — Encounter: Payer: Self-pay | Admitting: Nurse Practitioner

## 2022-09-30 ENCOUNTER — Inpatient Hospital Stay: Payer: Medicare Other

## 2022-09-30 ENCOUNTER — Inpatient Hospital Stay: Payer: Medicare Other | Attending: Oncology

## 2022-09-30 VITALS — BP 126/65 | HR 82 | Temp 98.1°F | Resp 18 | Ht 65.0 in | Wt 137.6 lb

## 2022-09-30 DIAGNOSIS — C61 Malignant neoplasm of prostate: Secondary | ICD-10-CM

## 2022-09-30 DIAGNOSIS — C641 Malignant neoplasm of right kidney, except renal pelvis: Secondary | ICD-10-CM | POA: Diagnosis not present

## 2022-09-30 DIAGNOSIS — C7951 Secondary malignant neoplasm of bone: Secondary | ICD-10-CM | POA: Diagnosis present

## 2022-09-30 DIAGNOSIS — Z79899 Other long term (current) drug therapy: Secondary | ICD-10-CM | POA: Diagnosis not present

## 2022-09-30 DIAGNOSIS — D649 Anemia, unspecified: Secondary | ICD-10-CM

## 2022-09-30 DIAGNOSIS — Z5111 Encounter for antineoplastic chemotherapy: Secondary | ICD-10-CM | POA: Diagnosis present

## 2022-09-30 LAB — CBC WITH DIFFERENTIAL (CANCER CENTER ONLY)
Abs Immature Granulocytes: 0.03 10*3/uL (ref 0.00–0.07)
Basophils Absolute: 0 10*3/uL (ref 0.0–0.1)
Basophils Relative: 0 %
Eosinophils Absolute: 0.1 10*3/uL (ref 0.0–0.5)
Eosinophils Relative: 1 %
HCT: 29.3 % — ABNORMAL LOW (ref 39.0–52.0)
Hemoglobin: 9 g/dL — ABNORMAL LOW (ref 13.0–17.0)
Immature Granulocytes: 0 %
Lymphocytes Relative: 13 %
Lymphs Abs: 1.1 10*3/uL (ref 0.7–4.0)
MCH: 29.5 pg (ref 26.0–34.0)
MCHC: 30.7 g/dL (ref 30.0–36.0)
MCV: 96.1 fL (ref 80.0–100.0)
Monocytes Absolute: 0.7 10*3/uL (ref 0.1–1.0)
Monocytes Relative: 8 %
Neutro Abs: 6.8 10*3/uL (ref 1.7–7.7)
Neutrophils Relative %: 78 %
Platelet Count: 184 10*3/uL (ref 150–400)
RBC: 3.05 MIL/uL — ABNORMAL LOW (ref 4.22–5.81)
RDW: 16.3 % — ABNORMAL HIGH (ref 11.5–15.5)
WBC Count: 8.8 10*3/uL (ref 4.0–10.5)
nRBC: 0 % (ref 0.0–0.2)

## 2022-09-30 LAB — BASIC METABOLIC PANEL - CANCER CENTER ONLY
Anion gap: 15 (ref 5–15)
BUN: 47 mg/dL — ABNORMAL HIGH (ref 8–23)
CO2: 17 mmol/L — ABNORMAL LOW (ref 22–32)
Calcium: 9.3 mg/dL (ref 8.9–10.3)
Chloride: 111 mmol/L (ref 98–111)
Creatinine: 1.9 mg/dL — ABNORMAL HIGH (ref 0.61–1.24)
GFR, Estimated: 39 mL/min — ABNORMAL LOW (ref >60)
Glucose, Bld: 109 mg/dL — ABNORMAL HIGH (ref 70–99)
Potassium: 3.9 mmol/L (ref 3.5–5.1)
Sodium: 143 mmol/L (ref 135–145)

## 2022-09-30 LAB — SAMPLE TO BLOOD BANK

## 2022-09-30 LAB — MAGNESIUM: Magnesium: 2.1 mg/dL (ref 1.7–2.4)

## 2022-09-30 MED ORDER — LEUPROLIDE ACETATE (4 MONTH) 30 MG ~~LOC~~ KIT
30.0000 mg | PACK | Freq: Once | SUBCUTANEOUS | Status: AC
  Administered 2022-09-30: 30 mg via SUBCUTANEOUS
  Filled 2022-09-30: qty 30

## 2022-09-30 NOTE — Patient Instructions (Signed)
Leuprolide Emulsion for Injection What is this medication? LEUPROLIDE (loo PROE lide) reduces the symptoms of prostate cancer. It works by decreasing levels of the hormone testosterone in the body. This prevents prostate cancer cells from spreading or growing. This medicine may be used for other purposes; ask your health care provider or pharmacist if you have questions. COMMON BRAND NAME(S): CAMCEVI What should I tell my care team before I take this medication? They need to know if you have any of these conditions: Diabetes Heart disease Heart failure High or low levels of electrolytes, such as magnesium, potassium, or sodium in your blood Irregular heartbeat or rhythm Seizures An unusual or allergic reaction to leuprolide, other medications, foods, dyes, or preservatives Pregnant or trying to get pregnant Breastfeeding How should I use this medication? This medication is injected under the skin. It is given by your care team in a hospital or clinic setting. Talk to your care team about the use of this medication in children. Special care may be needed. Overdosage: If you think you have taken too much of this medicine contact a poison control center or emergency room at once. NOTE: This medicine is only for you. Do not share this medicine with others. What if I miss a dose? Keep appointments for follow-up doses. It is important not to miss your dose. Call your care team if you are unable to keep an appointment. What may interact with this medication? Do not take this medication with any of the following: Cisapride Dronedarone Ketoconazole Levoketoconazole Pimozide Thioridazine This medication may also interact with the following: Other medications that cause heart rhythm changes This list may not describe all possible interactions. Give your health care provider a list of all the medicines, herbs, non-prescription drugs, or dietary supplements you use. Also tell them if you smoke,  drink alcohol, or use illegal drugs. Some items may interact with your medicine. What should I watch for while using this medication? Visit your care team for regular checks on your progress. Tell your care team if your symptoms do not start to get better or if they get worse. This medication may increase blood sugar. The risk may be higher in patients who already have diabetes. Ask your care team what you can do to lower the risk of diabetes while taking this medication. This medication may cause infertility. Talk to your care team if you are concerned about your fertility. Heart attacks and strokes have been reported with the use of this medication. Get emergency help if you develop signs or symptoms of a heart attack or stroke. Talk to your care team about the risks and benefits of this medication. What side effects may I notice from receiving this medication? Side effects that you should report to your care team as soon as possible: Allergic reactions--skin rash, itching, hives, swelling of the face, lips, tongue, or throat Heart attack--pain or tightness in the chest, shoulders, arms, or jaw, nausea, shortness of breath, cold or clammy skin, feeling faint or lightheaded Heart rhythm changes--fast or irregular heartbeat, dizziness, feeling faint or lightheaded, chest pain, trouble breathing High blood sugar (hyperglycemia)--increased thirst or amount of urine, unusual weakness or fatigue, blurry vision Mood swings, irritability, hostility Seizures Stroke--sudden numbness or weakness of the face, arm, or leg, trouble speaking, confusion, trouble walking, loss of balance or coordination, dizziness, severe headache, change in vision Thoughts of suicide or self-harm, worsening mood, feelings of depression Side effects that usually do not require medical attention (report to your care team  if they continue or are bothersome): Bone pain Change in sex drive or performance General discomfort and  fatigue Hot flashes Muscle pain Pain, redness, or irritation at injection site Swelling of the ankles, hands, or feet This list may not describe all possible side effects. Call your doctor for medical advice about side effects. You may report side effects to FDA at 1-800-FDA-1088. Where should I keep my medication? This medication is given in a hospital or clinic. It will not be stored at home. NOTE: This sheet is a summary. It may not cover all possible information. If you have questions about this medicine, talk to your doctor, pharmacist, or health care provider.  2023 Elsevier/Gold Standard (2022-02-11 00:00:00)

## 2022-09-30 NOTE — Progress Notes (Signed)
[place] OFFICE PROGRESS NOTE   Diagnosis: Renal cell carcinoma, prostate cancer  INTERVAL HISTORY:   Brandon Peters returns as scheduled.  He feels significantly better.  Appetite and energy are both improved.  Leg swelling is less.  He denies pain.  No bleeding.  Objective:  Vital signs in last 24 hours:  Blood pressure 126/65, pulse 82, temperature 98.1 F (36.7 C), temperature source Oral, resp. rate 18, height '5\' 5"'$  (1.651 m), weight 137 lb 9.6 oz (62.4 kg), SpO2 100 %.    HEENT: No thrush or ulcers. Resp: Lungs clear bilaterally, decreased breath sounds right lower lung field.  No respiratory distress. Cardio: Regular rate and rhythm. GI: No hepatosplenomegaly. Vascular: Trace edema lower leg bilaterally, left slightly greater than right.   Lab Results:  Lab Results  Component Value Date   WBC 8.8 09/30/2022   HGB 9.0 (L) 09/30/2022   HCT 29.3 (L) 09/30/2022   MCV 96.1 09/30/2022   PLT 184 09/30/2022   NEUTROABS 6.8 09/30/2022    Imaging:  No results found.  Medications: I have reviewed the patient's current medications.  Assessment/Plan: History of Severe anemia-status post a red cell transfusion 03/04/2016 2.. Prostate cancer Extensive sclerotic bone metastases noted on a CT of the abdomen/pelvis 03/04/2016 and MRI abdomen 03/05/2016 Markedly elevated PSA Lupron initiated 03/15/2016; Casodex 14 days beginning 03/15/2016 Prostate biopsy 03/18/2016 05/09/2016 PSA significantly improved at 21 Abiraterone initiated 05/10/2016 PSA less than 0.1 07/10/2018 PSA less than 0.1 08/30/2018 PSA less than 0.1 10/18/2018 PSA less than 0.1 11/29/2018 PSA less than 0.1 on 11/06/2019  PSA less than 0.1 on 03/05/2020 PSA less than 0.1 on 07/06/2020 PSA less than 0.1 08/17/2021 PSA less than 0.1 on 09/30/2021 PSA 0.1 on 11/12/2021 PSA 0.1 on 02/18/2022   3.   Symptoms of obstructive uropathy. Improved   4.   Distal duodenal and descending colon  masses biopsy of the duodenal mass 03/04/2016 confirmed fragments of small bowel mucosa with focal high-grade dysplasia Biopsy of the descending colon "polyps" revealed fragments of a tubulovillous adenoma CT 08/29/2017-similar soft tissue thickening in the region of the splenic flexure of the colon suboptimally evaluated due to lack of colonic enteric contrast Referred to Dr. Benson Norway Referred to [facility] Resection of descending colon polyps 01/31/2018, tubulovillous adenomas with high-grade dysplasia Attempted endoscopic resection of the fourth segment duodenal "polyp" on 05/09/2018, firm unresectable lesion, biopsy revealed moderately differentiated adenocarcinoma CT abdomen/pelvis 05/25/2018- heterogeneously enhancing right lower pole renal lesion; diffuse sclerotic osseous metastatic lesions compatible with history of prostate cancer CT chest 05/25/2018- no pulmonary metastasis.  Diffuse osseous metastasis involving the axial and appendicular skeleton. 06/19/2018-sleeve resection of third and fourth portions of duodenum with primary end-to-end anastomosis, feeding gastrojejunostomy tube placement, Dr. Cloyd Stagers; poorly differentiated adenocarcinoma duodenum, 3 cm; tumor invades the muscularis propria; lymphovascular invasion present; negative margins; 4 of 11 involved lymph nodes; soft tissue deposits also seen in the mesentery; all mucosal margins and soft tissue margins negative but 2 of the positive nodes are found in the soft tissue margin submitted for frozen section (pT2 pN2); second mucosal lesion 1.2 cm from the proximal margin, tubular adenoma with focal severe dysplasia Intact expression of mismatch repair proteins by IHC; microsatellite stable Cycle 1 adjuvant Xeloda 07/23/2018 Cycle 2 adjuvant Xeloda 08/13/2018 Cycle 3 adjuvant Xeloda 09/10/2018 (dose reduced to 1000 mg twice daily for 14 days)  Cycle 4 adjuvant Xeloda 10/01/2018 (dose increased to 1500 mg every morning and 1000 mg every afternoon for 14  days)  Cycle 5 adjuvant Xeloda 10/22/2018 Cycle 6 adjuvant Xeloda 11/12/2018 Cycle 7 adjuvant Xeloda 12/03/2018 Cycle 8 adjuvant Xeloda 12/24/2018   5.  Anorexia/weight loss- resolved   6.  Undescended testicles   7.  Right renal mass.  CT 08/29/2017-right renal mass involving the lower pole was partially calcified and does demonstrate enhancement following contrast most consistent with renal cell carcinoma.  Lesion not significantly enlarged compared with the prior CT. Followed by urology. 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 to the IVC/RA junction, upper normal size retrocaval node 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, nuclear grade 3 Axitinib/pembrolizumab 10/22/2021 Axitinib placed on hold 11/05/2021 due to hand-foot syndrome Axitinib resumed at a dose of 5 mg daily 11/12/2021, pembrolizumab 11/12/2021 Axitinib discontinued 12/03/2021 secondary to progressive hand-foot syndrome, pembrolizumab 12/03/2021 Axitinib resumed at a reduced dose of 2 mg twice daily after office visit 12/14/2021 Pembrolizumab 12/24/2021 Axitinib stopped 01/03/2022 secondary to persistent hand/foot symptoms CT chest at [facility] 01/13/2022-no evidence of thoracic metastatic disease, similar diffuse sclerotic change in the skeleton MRI abdomen at [facility] 01/13/2022-similar size of right lower pole renal mass with renal vein thrombosis, decreased overall extent of thrombus now extending to the  intrahepatic IVC, enhancement of right renal mass and thrombus is decreased, new left hydroureter ureter nephrosis with asymmetric perinephric stranding Pembrolizumab 01/27/2022 Axitinib resumed 2 mg daily 01/28/2022 CT abdomen/pelvis at [facility] 02/10/2022-overall reduced size of right lower pole renal mass which demonstrates persistent region of enhancement along the posterior and lateral margin with extension into the renal sinus.  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  Query additional IVC thrombus at the intrahepatic IVC and extending into the right atrium. Axitinib continued 2 mg daily, Pembrolizumab every 3 weeks CT 05/05/2022 at [facility]-partially visualized pulmonary embolism involving segmental arteries of the right lower lobe.  Interval slight decrease in size of the mass arising from the lower pole the right kidney.  Mass demonstrates persistent moderate necrosis centrally.  Increased enhancing soft tissue component measuring up to 2 cm which is mildly increased in size compared to prior from 02/10/2022.  Bland tumor thrombus identified in the IVC at the level of the right renal vein ostium extending into the suprarenal IVC superiorly.  No obvious thrombus identified in the infrarenal, intrahepatic and suprahepatic IVC.  Likely trace bland thrombus in the anterior right renal vein.  Stable several subcentimeter retroperitoneal lymph nodes.  Diffuse osseous sclerotic and lytic metastatic disease stable compared to prior. Axitinib placed on hold 05/04/2022 due to pain bilateral feet; 05/13/2022 he will continue to hold axitinib Pembrolizumab 05/13/2022 Pembrolizumab 06/03/2022 Robotic right radical nephrectomy and IVC thrombectomy 06/27/2022-right kidney with tumor necrosis/hemorrhage,ypT0, adherent thrombus with cellular necrosis and no viable carcinoma present at the renal vein margin, other margins negative for carcinoma, 0/3 lymph nodes.  6.5 cm tumor including intraparenchymal lesion  and thrombus in the renal vein 8.  Diabetes   9.  Anemia-stool Hemoccults + March 2021, referred to Dr. Benson Norway for endoscopic evaluation, anemia also secondary to metastatic prostate cancer, hypogonadism Small bowel  enteroscopy 02/11/2020-esophagus normal.  Hematin found in the gastric body.  Examined duodenum normal.  No evidence of significant pathology in the entire examined portion of the jejunum.  There was no overt source of bleeding or inflammation.  The bleeding may be from small AVMs.  Surgical anastomosis was not visible with repeated inspection of the duodenum. Colonoscopy 02/11/2020-multiple sessile and semipedunculated polyps were found in the rectum, descending colon and ascending colon, 3 to 18 mm in size.  Polyps removed (polypectomy descending colon-fragments of tubular adenoma, inflammatory type polyp; polypectomy ascending colon-tubular adenoma, no high-grade dysplasia or malignancy; polypectomy descending colon-fragments of tubulovillous adenoma, no high-grade dysplasia or malignancy; polypectomy colon, rectum-tubular adenoma, no high-grade dysplasia or malignancy). Colonoscopy 02/02/2021-seven 2 to 10 mm polyps in the sigmoid colon (tubular adenoma), descending colon (fragments of tubulovillous adenoma, fragments of tubular adenoma ) and transverse colon (fragments of tubular adenoma). Progressive anemia 07/05/2021; stool cards positive Upper endoscopy/colonoscopy 09/23/2021-multiple polyps removed from the colon-tubular adenomas, diffuse angioectasias in the stomach with bleeding-not amenable to endoscopic treatment 10.  Renal failure-progressive 01/17/2022, progressive following right nephrectomy October 2023 11.  CT chest at [facility] 01/13/2022-no evidence of thoracic metastatic disease, similar diffuse sclerotic changes in the skeleton 12.  CT renal stone study 01/14/2022-mild left side hydroureteronephrosis secondary to a left UVJ stone, left nephrolithiasis, new right middle lobe  collapse/consolidation, edema/fluid at the left iliopsoas muscle 13.  PE noted on CT 05/05/2022-on apixaban.  He saw Dr. Joan Flores at [facility] 05/16/2022; instructions are to hold apixaban 72 hours prior to scheduled surgery and start Lovenox at prophylactic dosing.    Disposition: Mr. Loftus appears stable.  He continues abiraterone.  Lupron injection today.  We will follow-up on the PSA.    He was transfused 2 units of blood 09/16/2022.  Hemoglobin is 9 today.    He continues follow-up with nephrology at [facility].  Reports there has been recent improvement in kidney function.  Today's basic metabolic panel is pending.  He will return for lab and follow-up in approximately 4 weeks.  We are available to see him sooner if needed.    Ned Card ANP/GNP-BC   09/30/2022  9:24 AM

## 2022-10-01 LAB — PROSTATE-SPECIFIC AG, SERUM (LABCORP): Prostate Specific Ag, Serum: 0.2 ng/mL (ref 0.0–4.0)

## 2022-10-03 ENCOUNTER — Telehealth: Payer: Self-pay

## 2022-10-03 ENCOUNTER — Encounter: Payer: Self-pay | Admitting: Oncology

## 2022-10-03 ENCOUNTER — Other Ambulatory Visit (HOSPITAL_COMMUNITY): Payer: Self-pay

## 2022-10-03 NOTE — Telephone Encounter (Signed)
Oral Oncology Patient Advocate Encounter  Was successful in securing patient a $10,000 grant from [employer] to provide copayment coverage for Inlyta.  This will keep the out of pocket expense at $0.     [employer] ID: [id number]  I have spoken with the patient.   The billing information is as follows and has been shared with [facility].    RxBin: [id number] PCN: [id number] Member ID: [id number] Group ID: [id number] Dates of Eligibility: 09/11/22 through 09/11/23  Fund:  Renal Cell Carcinoma - Medicare [place], [facility] Patient [place]  [phone] (phone) [phone] (fax) 10/03/2022 11:13 AM

## 2022-10-04 ENCOUNTER — Other Ambulatory Visit: Payer: Self-pay

## 2022-10-04 ENCOUNTER — Other Ambulatory Visit (HOSPITAL_COMMUNITY): Payer: Self-pay

## 2022-10-04 ENCOUNTER — Telehealth: Payer: Self-pay

## 2022-10-04 NOTE — Telephone Encounter (Addendum)
Oral Oncology Patient Advocate Encounter  Was successful in securing patient a $8,000 grant from [facility] to provide copayment coverage for Abiraterone.  This will keep the out of pocket expense at $0.    I have spoken to the patient.  [employer] ID: [id number]   The billing information is as follows and has been shared with [facility].    RxBin: [id number] PCN: [id number] Member ID: [id number] Group ID: [id number] Dates of Eligibility: 08/30/22 through 08/30/23  Fund:  Prostate Cancer - Medicare [place], [place] [facility] Patient [place]  [phone] (phone) [phone] (fax) 10/04/2022 11:27 AM

## 2022-10-04 NOTE — Telephone Encounter (Signed)
Oral Oncology Patient Advocate Encounter   Was successful in securing patient a $7,500 grant from [employer] to provide copayment coverage for Abiraterone.  This will keep the out of pocket expense at $0.     I have spoken with the patient.    The billing information is as follows and has been shared with [place].   Member ID: [id number] Group ID: [facility] RxBin: [id number] PCN: [id number] Dates of Eligibility: 10/03/22 through 10/04/23  Fund name:  Metastatic Prostate Cancer.   Berdine Addison, [place] [facility] Patient [place]  [phone] (phone) [phone] (fax) 10/04/2022 2:51 PM

## 2022-10-06 ENCOUNTER — Other Ambulatory Visit (HOSPITAL_COMMUNITY): Payer: Self-pay

## 2022-10-27 ENCOUNTER — Other Ambulatory Visit (HOSPITAL_COMMUNITY): Payer: Self-pay

## 2022-11-01 ENCOUNTER — Other Ambulatory Visit: Payer: Self-pay

## 2022-11-04 ENCOUNTER — Inpatient Hospital Stay: Payer: Medicare Other | Admitting: Oncology

## 2022-11-04 ENCOUNTER — Inpatient Hospital Stay: Payer: Medicare Other | Attending: Oncology

## 2022-11-04 VITALS — BP 150/76 | HR 97 | Temp 98.2°F | Resp 18 | Ht 65.0 in | Wt 134.2 lb

## 2022-11-04 DIAGNOSIS — Z86711 Personal history of pulmonary embolism: Secondary | ICD-10-CM | POA: Insufficient documentation

## 2022-11-04 DIAGNOSIS — C641 Malignant neoplasm of right kidney, except renal pelvis: Secondary | ICD-10-CM

## 2022-11-04 DIAGNOSIS — Q539 Undescended testicle, unspecified: Secondary | ICD-10-CM | POA: Diagnosis not present

## 2022-11-04 DIAGNOSIS — Z7901 Long term (current) use of anticoagulants: Secondary | ICD-10-CM | POA: Insufficient documentation

## 2022-11-04 DIAGNOSIS — Z905 Acquired absence of kidney: Secondary | ICD-10-CM | POA: Diagnosis not present

## 2022-11-04 DIAGNOSIS — N19 Unspecified kidney failure: Secondary | ICD-10-CM | POA: Insufficient documentation

## 2022-11-04 DIAGNOSIS — Z79899 Other long term (current) drug therapy: Secondary | ICD-10-CM | POA: Insufficient documentation

## 2022-11-04 DIAGNOSIS — C7951 Secondary malignant neoplasm of bone: Secondary | ICD-10-CM | POA: Diagnosis present

## 2022-11-04 DIAGNOSIS — Z8553 Personal history of malignant neoplasm of renal pelvis: Secondary | ICD-10-CM | POA: Insufficient documentation

## 2022-11-04 DIAGNOSIS — C61 Malignant neoplasm of prostate: Secondary | ICD-10-CM | POA: Diagnosis present

## 2022-11-04 DIAGNOSIS — Z86718 Personal history of other venous thrombosis and embolism: Secondary | ICD-10-CM | POA: Diagnosis not present

## 2022-11-04 DIAGNOSIS — Z7952 Long term (current) use of systemic steroids: Secondary | ICD-10-CM | POA: Diagnosis not present

## 2022-11-04 DIAGNOSIS — E119 Type 2 diabetes mellitus without complications: Secondary | ICD-10-CM | POA: Insufficient documentation

## 2022-11-04 LAB — CMP (CANCER CENTER ONLY)
ALT: 12 U/L (ref 0–44)
AST: 13 U/L — ABNORMAL LOW (ref 15–41)
Albumin: 4.1 g/dL (ref 3.5–5.0)
Alkaline Phosphatase: 49 U/L (ref 38–126)
Anion gap: 9 (ref 5–15)
BUN: 41 mg/dL — ABNORMAL HIGH (ref 8–23)
CO2: 21 mmol/L — ABNORMAL LOW (ref 22–32)
Calcium: 9.6 mg/dL (ref 8.9–10.3)
Chloride: 110 mmol/L (ref 98–111)
Creatinine: 2.2 mg/dL — ABNORMAL HIGH (ref 0.61–1.24)
GFR, Estimated: 32 mL/min — ABNORMAL LOW (ref >60)
Glucose, Bld: 110 mg/dL — ABNORMAL HIGH (ref 70–99)
Potassium: 3.3 mmol/L — ABNORMAL LOW (ref 3.5–5.1)
Sodium: 140 mmol/L (ref 135–145)
Total Bilirubin: 0.5 mg/dL (ref 0.3–1.2)
Total Protein: 7 g/dL (ref 6.5–8.1)

## 2022-11-04 LAB — CBC WITH DIFFERENTIAL (CANCER CENTER ONLY)
Abs Immature Granulocytes: 0.02 10*3/uL (ref 0.00–0.07)
Basophils Absolute: 0 10*3/uL (ref 0.0–0.1)
Basophils Relative: 0 %
Eosinophils Absolute: 0.1 10*3/uL (ref 0.0–0.5)
Eosinophils Relative: 1 %
HCT: 29.5 % — ABNORMAL LOW (ref 39.0–52.0)
Hemoglobin: 9.2 g/dL — ABNORMAL LOW (ref 13.0–17.0)
Immature Granulocytes: 0 %
Lymphocytes Relative: 13 %
Lymphs Abs: 1 10*3/uL (ref 0.7–4.0)
MCH: 29.8 pg (ref 26.0–34.0)
MCHC: 31.2 g/dL (ref 30.0–36.0)
MCV: 95.5 fL (ref 80.0–100.0)
Monocytes Absolute: 0.6 10*3/uL (ref 0.1–1.0)
Monocytes Relative: 8 %
Neutro Abs: 5.7 10*3/uL (ref 1.7–7.7)
Neutrophils Relative %: 78 %
Platelet Count: 222 10*3/uL (ref 150–400)
RBC: 3.09 MIL/uL — ABNORMAL LOW (ref 4.22–5.81)
RDW: 15.6 % — ABNORMAL HIGH (ref 11.5–15.5)
WBC Count: 7.4 10*3/uL (ref 4.0–10.5)
nRBC: 0 % (ref 0.0–0.2)

## 2022-11-04 NOTE — Progress Notes (Signed)
[place] OFFICE PROGRESS NOTE   Diagnosis: Prostate cancer, renal cell carcinoma  INTERVAL HISTORY:   Brandon Peters returns as scheduled.  He feels well.  Appetite.  No pain.  He continues tislelizumab.  Objective:  Vital signs in last 24 hours:  Blood pressure (!) 150/76, pulse 97, temperature 98.2 F (36.8 C), temperature source Oral, resp. rate 18, height 5' 5"$  (1.651 m), weight 134 lb 3.2 oz (60.9 kg), SpO2 100 %.   Resp: Decreased breath sounds at the right lower chest, no respiratory distress Cardio: Regular rate and rhythm GI: Nontender, no hepatosplenomegaly, no mass Vascular: No leg edema  Skin: Sutures remain in place at a right chest tube site   Lab Results:  Lab Results  Component Value Date   WBC 7.4 11/04/2022   HGB 9.2 (L) 11/04/2022   HCT 29.5 (L) 11/04/2022   MCV 95.5 11/04/2022   PLT 222 11/04/2022   NEUTROABS 5.7 11/04/2022    CMP  Lab Results  Component Value Date   NA 140 11/04/2022   K 3.3 (L) 11/04/2022   CL 110 11/04/2022   CO2 21 (L) 11/04/2022   GLUCOSE 110 (H) 11/04/2022   BUN 41 (H) 11/04/2022   CREATININE 2.20 (H) 11/04/2022   CALCIUM 9.6 11/04/2022   PROT 7.0 11/04/2022   ALBUMIN 4.1 11/04/2022   AST 13 (L) 11/04/2022   ALT 12 11/04/2022   ALKPHOS 49 11/04/2022   BILITOT 0.5 11/04/2022   GFRNONAA 32 (L) 11/04/2022   GFRAA >60 03/05/2020    Lab Results  Component Value Date   CEA1 3.31 05/03/2019    Lab Results  Component Value Date   INR 1.0 10/13/2021   LABPROT 12.9 10/13/2021    Imaging:  No results found.  Medications: I have reviewed the patient's current medications.   Assessment/Plan: History of Severe anemia-status post a red cell transfusion 03/04/2016 2.. Prostate cancer Extensive sclerotic bone metastases noted on a CT of the abdomen/pelvis 03/04/2016 and MRI abdomen 03/05/2016 Markedly elevated PSA Lupron initiated 03/15/2016; Casodex 14 days beginning 03/15/2016 Prostate biopsy  03/18/2016 05/09/2016 PSA significantly improved at 21 Abiraterone initiated 05/10/2016 PSA less than 0.1 07/10/2018 PSA less than 0.1 08/30/2018 PSA less than 0.1 10/18/2018 PSA less than 0.1 11/29/2018 PSA less than 0.1 on 11/06/2019  PSA less than 0.1 on 03/05/2020 PSA less than 0.1 on 07/06/2020 PSA less than 0.1 08/17/2021 PSA less than 0.1 on 09/30/2021 PSA 0.1 on 11/12/2021 PSA 0.1 on 02/18/2022   3.   Symptoms of obstructive uropathy. Improved   4.   Distal duodenal and descending colon masses biopsy of the duodenal mass 03/04/2016 confirmed fragments of small bowel mucosa with focal high-grade dysplasia Biopsy of the descending colon "polyps" revealed fragments of a tubulovillous adenoma CT 08/29/2017-similar soft tissue thickening in the region of the splenic flexure of the colon suboptimally evaluated due to lack of colonic enteric contrast Referred to Dr. Benson Norway Referred to [facility] Resection of descending colon polyps 01/31/2018, tubulovillous adenomas with high-grade dysplasia Attempted endoscopic resection of the fourth segment duodenal "polyp" on 05/09/2018, firm unresectable lesion, biopsy revealed moderately differentiated adenocarcinoma CT abdomen/pelvis 05/25/2018- heterogeneously enhancing right lower pole renal lesion; diffuse sclerotic osseous metastatic lesions compatible with history of prostate cancer CT chest 05/25/2018- no pulmonary metastasis.  Diffuse osseous metastasis involving the axial and appendicular skeleton. 06/19/2018-sleeve resection of third and fourth portions of duodenum with primary end-to-end anastomosis, feeding gastrojejunostomy tube placement, Dr. Cloyd Stagers; poorly differentiated adenocarcinoma duodenum, 3 cm; tumor invades  the muscularis propria; lymphovascular invasion present; negative margins; 4 of 11 involved lymph nodes; soft tissue deposits also seen in the mesentery; all mucosal margins and soft tissue margins negative but 2 of the positive nodes are found in  the soft tissue margin submitted for frozen section (pT2 pN2); second mucosal lesion 1.2 cm from the proximal margin, tubular adenoma with focal severe dysplasia Intact expression of mismatch repair proteins by IHC; microsatellite stable Cycle 1 adjuvant Xeloda 07/23/2018 Cycle 2 adjuvant Xeloda 08/13/2018 Cycle 3 adjuvant Xeloda 09/10/2018 (dose reduced to 1000 mg twice daily for 14 days)  Cycle 4 adjuvant Xeloda 10/01/2018 (dose increased to 1500 mg every morning and 1000 mg every afternoon for 14 days) Cycle 5 adjuvant Xeloda 10/22/2018 Cycle 6 adjuvant Xeloda 11/12/2018 Cycle 7 adjuvant Xeloda 12/03/2018 Cycle 8 adjuvant Xeloda 12/24/2018   5.  Anorexia/weight loss- resolved   6.  Undescended testicles   7.  Right renal mass.  CT 08/29/2017-right renal mass involving the lower pole was partially calcified and does demonstrate enhancement following contrast most consistent with renal cell carcinoma.  Lesion not significantly enlarged compared with the prior CT. Followed by urology. 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 to the IVC/RA junction, upper normal size retrocaval node 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, nuclear grade 3 Axitinib/pembrolizumab 10/22/2021 Axitinib placed on hold 11/05/2021 due to hand-foot syndrome Axitinib resumed at a dose of 5 mg daily 11/12/2021, pembrolizumab 11/12/2021 Axitinib discontinued 12/03/2021 secondary to progressive  hand-foot syndrome, pembrolizumab 12/03/2021 Axitinib resumed at a reduced dose of 2 mg twice daily after office visit 12/14/2021 Pembrolizumab 12/24/2021 Axitinib stopped 01/03/2022 secondary to persistent hand/foot symptoms CT chest at [facility] 01/13/2022-no evidence of thoracic metastatic disease, similar diffuse sclerotic change in the skeleton MRI abdomen at [facility] 01/13/2022-similar size of right lower pole renal mass with renal vein thrombosis, decreased overall extent of thrombus now extending to the intrahepatic IVC, enhancement of right renal mass and thrombus is decreased, new left hydroureter ureter nephrosis with asymmetric perinephric stranding Pembrolizumab 01/27/2022 Axitinib resumed 2 mg daily 01/28/2022 CT abdomen/pelvis at [facility] 02/10/2022-overall reduced size of right lower pole renal mass which demonstrates persistent region of enhancement along the posterior and lateral margin with extension into the renal sinus.  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  Query additional IVC thrombus at the intrahepatic IVC and extending into the right atrium. Axitinib continued 2 mg daily, Pembrolizumab every 3 weeks CT 05/05/2022 at [facility]-partially visualized pulmonary embolism involving segmental arteries of the right lower lobe.  Interval slight decrease in size of the mass arising from the lower pole the right kidney.  Mass demonstrates persistent moderate necrosis centrally.  Increased enhancing soft tissue component measuring up to 2 cm which is mildly increased in size compared to prior from 02/10/2022.  Bland tumor thrombus identified in the IVC at the level of the right renal vein ostium extending into the suprarenal IVC superiorly.  No obvious thrombus identified in the infrarenal, intrahepatic and suprahepatic IVC.  Likely trace bland thrombus in the anterior  right renal vein.  Stable several subcentimeter retroperitoneal lymph nodes.  Diffuse osseous sclerotic and lytic metastatic  disease stable compared to prior. Axitinib placed on hold 05/04/2022 due to pain bilateral feet; 05/13/2022 he will continue to hold axitinib Pembrolizumab 05/13/2022 Pembrolizumab 06/03/2022 Robotic right radical nephrectomy and IVC thrombectomy 06/27/2022-right kidney with tumor necrosis/hemorrhage,ypT0, adherent thrombus with cellular necrosis and no viable carcinoma present at the renal vein margin, other margins negative for carcinoma, 0/3 lymph nodes.  6.5 cm tumor including intraparenchymal lesion and thrombus in the renal vein 8.  Diabetes   9.  Anemia-stool Hemoccults + March 2021, referred to Dr. Benson Norway for endoscopic evaluation, anemia also secondary to metastatic prostate cancer, hypogonadism Small bowel enteroscopy 02/11/2020-esophagus normal.  Hematin found in the gastric body.  Examined duodenum normal.  No evidence of significant pathology in the entire examined portion of the jejunum.  There was no overt source of bleeding or inflammation.  The bleeding may be from small AVMs.  Surgical anastomosis was not visible with repeated inspection of the duodenum. Colonoscopy 02/11/2020-multiple sessile and semipedunculated polyps were found in the rectum, descending colon and ascending colon, 3 to 18 mm in size.  Polyps removed (polypectomy descending colon-fragments of tubular adenoma, inflammatory type polyp; polypectomy ascending colon-tubular adenoma, no high-grade dysplasia or malignancy; polypectomy descending colon-fragments of tubulovillous adenoma, no high-grade dysplasia or malignancy; polypectomy colon, rectum-tubular adenoma, no high-grade dysplasia or malignancy). Colonoscopy 02/02/2021-seven 2 to 10 mm polyps in the sigmoid colon (tubular adenoma), descending colon (fragments of tubulovillous adenoma, fragments of tubular adenoma ) and transverse colon (fragments of tubular adenoma). Progressive anemia 07/05/2021; stool cards positive Upper endoscopy/colonoscopy 09/23/2021-multiple polyps  removed from the colon-tubular adenomas, diffuse angioectasias in the stomach with bleeding-not amenable to endoscopic treatment 10.  Renal failure-progressive 01/17/2022, progressive following right nephrectomy October 2023 11.  CT chest at [facility] 01/13/2022-no evidence of thoracic metastatic disease, similar diffuse sclerotic changes in the skeleton 12.  CT renal stone study 01/14/2022-mild left side hydroureteronephrosis secondary to a left UVJ stone, left nephrolithiasis, new right middle lobe collapse/consolidation, edema/fluid at the left iliopsoas muscle 13.  PE noted on CT 05/05/2022-on apixaban.  He saw Dr. Joan Flores at [facility] 05/16/2022; instructions are to hold apixaban 72 hours prior to scheduled surgery and start Lovenox at prophylactic dosing.     Disposition: Mr Schreckengost appears stable.  He continues abiraterone and prednisone.  We will follow-up on the PSA from today.  He continues every 30-monthLupron. The renal function remains improved compared to a few months ago.  The hemoglobin is stable.  We will remove sutures from the old chest tube site.  Mr. HEckrichwill return for an office visit in 1 month.  GBetsy Coder MD  11/04/2022  12:00 PM

## 2022-11-04 NOTE — Progress Notes (Signed)
Per Dr. Benay Spice request: Removed #3 sutures from right lateral chest wall without difficulty. No bleeding at site and edges are approximated.

## 2022-11-05 LAB — SAMPLE TO BLOOD BANK

## 2022-11-29 ENCOUNTER — Other Ambulatory Visit: Payer: Self-pay | Admitting: Oncology

## 2022-11-29 ENCOUNTER — Other Ambulatory Visit: Payer: Self-pay

## 2022-11-29 ENCOUNTER — Other Ambulatory Visit (HOSPITAL_COMMUNITY): Payer: Self-pay

## 2022-11-29 DIAGNOSIS — C61 Malignant neoplasm of prostate: Secondary | ICD-10-CM

## 2022-11-29 MED ORDER — ABIRATERONE ACETATE 250 MG PO TABS
ORAL_TABLET | ORAL | 5 refills | Status: DC
  Filled 2022-11-29 – 2022-12-01 (×2): qty 120, 30d supply, fill #0
  Filled 2022-12-28: qty 120, 30d supply, fill #1
  Filled 2023-01-31: qty 120, 30d supply, fill #2
  Filled 2023-02-27: qty 120, 30d supply, fill #3
  Filled 2023-03-28: qty 120, 30d supply, fill #4
  Filled 2023-04-24: qty 120, 30d supply, fill #5

## 2022-12-01 ENCOUNTER — Other Ambulatory Visit (HOSPITAL_COMMUNITY): Payer: Self-pay

## 2022-12-06 ENCOUNTER — Other Ambulatory Visit: Payer: Self-pay | Admitting: Oncology

## 2022-12-09 ENCOUNTER — Inpatient Hospital Stay: Payer: Medicare Other | Attending: Oncology

## 2022-12-09 ENCOUNTER — Inpatient Hospital Stay: Payer: Medicare Other | Admitting: Oncology

## 2022-12-09 VITALS — BP 127/64 | HR 82 | Temp 98.1°F | Resp 18 | Ht 65.0 in | Wt 137.6 lb

## 2022-12-09 DIAGNOSIS — I2699 Other pulmonary embolism without acute cor pulmonale: Secondary | ICD-10-CM | POA: Insufficient documentation

## 2022-12-09 DIAGNOSIS — Z7901 Long term (current) use of anticoagulants: Secondary | ICD-10-CM | POA: Insufficient documentation

## 2022-12-09 DIAGNOSIS — C7951 Secondary malignant neoplasm of bone: Secondary | ICD-10-CM | POA: Insufficient documentation

## 2022-12-09 DIAGNOSIS — Q5320 Undescended testicle, unspecified, bilateral: Secondary | ICD-10-CM | POA: Diagnosis not present

## 2022-12-09 DIAGNOSIS — E119 Type 2 diabetes mellitus without complications: Secondary | ICD-10-CM | POA: Insufficient documentation

## 2022-12-09 DIAGNOSIS — N19 Unspecified kidney failure: Secondary | ICD-10-CM | POA: Insufficient documentation

## 2022-12-09 DIAGNOSIS — C61 Malignant neoplasm of prostate: Secondary | ICD-10-CM | POA: Diagnosis not present

## 2022-12-09 DIAGNOSIS — C641 Malignant neoplasm of right kidney, except renal pelvis: Secondary | ICD-10-CM

## 2022-12-09 DIAGNOSIS — Z905 Acquired absence of kidney: Secondary | ICD-10-CM | POA: Diagnosis not present

## 2022-12-09 LAB — CMP (CANCER CENTER ONLY)
ALT: 12 U/L (ref 0–44)
AST: 15 U/L (ref 15–41)
Albumin: 4.3 g/dL (ref 3.5–5.0)
Alkaline Phosphatase: 41 U/L (ref 38–126)
Anion gap: 9 (ref 5–15)
BUN: 48 mg/dL — ABNORMAL HIGH (ref 8–23)
CO2: 21 mmol/L — ABNORMAL LOW (ref 22–32)
Calcium: 9.4 mg/dL (ref 8.9–10.3)
Chloride: 110 mmol/L (ref 98–111)
Creatinine: 2.09 mg/dL — ABNORMAL HIGH (ref 0.61–1.24)
GFR, Estimated: 34 mL/min — ABNORMAL LOW (ref >60)
Glucose, Bld: 107 mg/dL — ABNORMAL HIGH (ref 70–99)
Potassium: 3.5 mmol/L (ref 3.5–5.1)
Sodium: 140 mmol/L (ref 135–145)
Total Bilirubin: 0.5 mg/dL (ref 0.3–1.2)
Total Protein: 7.1 g/dL (ref 6.5–8.1)

## 2022-12-09 LAB — CBC WITH DIFFERENTIAL (CANCER CENTER ONLY)
Abs Immature Granulocytes: 0.03 10*3/uL (ref 0.00–0.07)
Basophils Absolute: 0 10*3/uL (ref 0.0–0.1)
Basophils Relative: 0 %
Eosinophils Absolute: 0.1 10*3/uL (ref 0.0–0.5)
Eosinophils Relative: 1 %
HCT: 30.9 % — ABNORMAL LOW (ref 39.0–52.0)
Hemoglobin: 9.5 g/dL — ABNORMAL LOW (ref 13.0–17.0)
Immature Granulocytes: 0 %
Lymphocytes Relative: 9 %
Lymphs Abs: 0.6 10*3/uL — ABNORMAL LOW (ref 0.7–4.0)
MCH: 29.7 pg (ref 26.0–34.0)
MCHC: 30.7 g/dL (ref 30.0–36.0)
MCV: 96.6 fL (ref 80.0–100.0)
Monocytes Absolute: 0.5 10*3/uL (ref 0.1–1.0)
Monocytes Relative: 7 %
Neutro Abs: 6.1 10*3/uL (ref 1.7–7.7)
Neutrophils Relative %: 83 %
Platelet Count: 209 10*3/uL (ref 150–400)
RBC: 3.2 MIL/uL — ABNORMAL LOW (ref 4.22–5.81)
RDW: 14.9 % (ref 11.5–15.5)
WBC Count: 7.3 10*3/uL (ref 4.0–10.5)
nRBC: 0 % (ref 0.0–0.2)

## 2022-12-09 MED ORDER — APIXABAN 2.5 MG PO TABS: 2.5000 mg | ORAL_TABLET | Freq: Two times a day (BID) | ORAL | 3 refills | Status: DC

## 2022-12-09 NOTE — Progress Notes (Signed)
[place] OFFICE PROGRESS NOTE   Diagnosis: Prostate cancer, renal cell carcinoma  INTERVAL HISTORY:   Brandon Peters returns as scheduled.  He continues abiraterone and prednisone.  He continues apixaban anticoagulation.  No bleeding.  No dyspnea or pain.  Good appetite.  Objective:  Vital signs in last 24 hours:  Blood pressure 127/64, pulse 82, temperature 98.1 F (36.7 C), temperature source Oral, resp. rate 18, height 5\' 5"  (1.651 m), weight 137 lb 9.6 oz (62.4 kg), SpO2 100 %.   Lymphatics: No cervical, supraclavicular, axillary, or inguinal nodes Resp: Diminished breath sounds at the right lower chest, no respiratory distress Cardio: Regular rate and rhythm GI: No hepatosplenomegaly, no mass, nontender Vascular: Pitting edema at the left greater than right lower leg   Lab Results:  Lab Results  Component Value Date   WBC 7.3 12/09/2022   HGB 9.5 (L) 12/09/2022   HCT 30.9 (L) 12/09/2022   MCV 96.6 12/09/2022   PLT 209 12/09/2022   NEUTROABS 6.1 12/09/2022    CMP  Lab Results  Component Value Date   NA 140 12/09/2022   K 3.5 12/09/2022   CL 110 12/09/2022   CO2 21 (L) 12/09/2022   GLUCOSE 107 (H) 12/09/2022   BUN 48 (H) 12/09/2022   CREATININE 2.09 (H) 12/09/2022   CALCIUM 9.4 12/09/2022   PROT 7.1 12/09/2022   ALBUMIN 4.3 12/09/2022   AST 15 12/09/2022   ALT 12 12/09/2022   ALKPHOS 41 12/09/2022   BILITOT 0.5 12/09/2022   GFRNONAA 34 (L) 12/09/2022   GFRAA >60 03/05/2020    Lab Results  Component Value Date   CEA1 3.31 05/03/2019     Medications: I have reviewed the patient's current medications.   Assessment/Plan:  History of Severe anemia-status post a red cell transfusion 03/04/2016 2.. Prostate cancer Extensive sclerotic bone metastases noted on a CT of the abdomen/pelvis 03/04/2016 and MRI abdomen 03/05/2016 Markedly elevated PSA Lupron initiated 03/15/2016; Casodex 14 days beginning 03/15/2016 Prostate biopsy  03/18/2016 05/09/2016 PSA significantly improved at 21 Abiraterone initiated 05/10/2016 PSA less than 0.1 07/10/2018 PSA less than 0.1 08/30/2018 PSA less than 0.1 10/18/2018 PSA less than 0.1 11/29/2018 PSA less than 0.1 on 11/06/2019  PSA less than 0.1 on 03/05/2020 PSA less than 0.1 on 07/06/2020 PSA less than 0.1 08/17/2021 PSA less than 0.1 on 09/30/2021 PSA 0.1 on 11/12/2021 PSA 0.1 on 02/18/2022   3.   Symptoms of obstructive uropathy. Improved   4.   Distal duodenal and descending colon masses biopsy of the duodenal mass 03/04/2016 confirmed fragments of small bowel mucosa with focal high-grade dysplasia Biopsy of the descending colon "polyps" revealed fragments of a tubulovillous adenoma CT 08/29/2017-similar soft tissue thickening in the region of the splenic flexure of the colon suboptimally evaluated due to lack of colonic enteric contrast Referred to Dr. Benson Norway Referred to [facility] Resection of descending colon polyps 01/31/2018, tubulovillous adenomas with high-grade dysplasia Attempted endoscopic resection of the fourth segment duodenal "polyp" on 05/09/2018, firm unresectable lesion, biopsy revealed moderately differentiated adenocarcinoma CT abdomen/pelvis 05/25/2018- heterogeneously enhancing right lower pole renal lesion; diffuse sclerotic osseous metastatic lesions compatible with history of prostate cancer CT chest 05/25/2018- no pulmonary metastasis.  Diffuse osseous metastasis involving the axial and appendicular skeleton. 06/19/2018-sleeve resection of third and fourth portions of duodenum with primary end-to-end anastomosis, feeding gastrojejunostomy tube placement, Dr. Cloyd Stagers; poorly differentiated adenocarcinoma duodenum, 3 cm; tumor invades the muscularis propria; lymphovascular invasion present; negative margins; 4 of 11 involved lymph nodes;  soft tissue deposits also seen in the mesentery; all mucosal margins and soft tissue margins negative but 2 of the positive nodes are found in  the soft tissue margin submitted for frozen section (pT2 pN2); second mucosal lesion 1.2 cm from the proximal margin, tubular adenoma with focal severe dysplasia Intact expression of mismatch repair proteins by IHC; microsatellite stable Cycle 1 adjuvant Xeloda 07/23/2018 Cycle 2 adjuvant Xeloda 08/13/2018 Cycle 3 adjuvant Xeloda 09/10/2018 (dose reduced to 1000 mg twice daily for 14 days)  Cycle 4 adjuvant Xeloda 10/01/2018 (dose increased to 1500 mg every morning and 1000 mg every afternoon for 14 days) Cycle 5 adjuvant Xeloda 10/22/2018 Cycle 6 adjuvant Xeloda 11/12/2018 Cycle 7 adjuvant Xeloda 12/03/2018 Cycle 8 adjuvant Xeloda 12/24/2018   5.  Anorexia/weight loss- resolved   6.  Undescended testicles   7.  Right renal mass.  CT 08/29/2017-right renal mass involving the lower pole was partially calcified and does demonstrate enhancement following contrast most consistent with renal cell carcinoma.  Lesion not significantly enlarged compared with the prior CT. Followed by urology. 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 to the IVC/RA junction, upper normal size retrocaval node 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 01/13/2022-no evidence of thoracic metastatic disease, similar diffuse sclerotic change in the skeleton MRI abdomen at [facility] 01/13/2022-similar size of right lower pole renal mass with renal vein thrombosis, decreased overall extent of thrombus now extending to the intrahepatic IVC, enhancement of right renal mass and thrombus is decreased, new left hydroureter ureter nephrosis with asymmetric perinephric stranding Pembrolizumab 01/27/2022 Axitinib resumed 2 mg daily 01/28/2022 CT abdomen/pelvis at [facility] 02/10/2022-overall reduced size of right lower pole renal mass which demonstrates persistent region of enhancement along the posterior and lateral margin with extension into the renal sinus.  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  Query additional IVC thrombus at the intrahepatic IVC and extending into the right atrium. Axitinib continued 2 mg daily, Pembrolizumab every 3 weeks CT 05/05/2022 at [facility]-partially visualized pulmonary embolism involving segmental arteries of the right lower lobe.  Interval slight decrease in size of the mass arising from the lower pole the right kidney.  Mass demonstrates persistent moderate necrosis centrally.  Increased enhancing soft tissue component measuring up to 2 cm which is mildly increased in size compared to prior from 02/10/2022.  Bland tumor thrombus identified in the IVC at the level of the right renal vein ostium extending into the suprarenal IVC superiorly.  No obvious thrombus identified in the infrarenal, intrahepatic and suprahepatic IVC.  Likely trace bland thrombus in the anterior right renal vein.  Stable several subcentimeter retroperitoneal lymph nodes.  Diffuse osseous sclerotic  and lytic metastatic  disease stable compared to prior. Axitinib placed on hold 05/04/2022 due to pain bilateral feet; 05/13/2022 he will continue to hold axitinib Pembrolizumab 05/13/2022 Pembrolizumab 06/03/2022 Robotic right radical nephrectomy and IVC thrombectomy 06/27/2022-right kidney with tumor necrosis/hemorrhage,ypT0, adherent thrombus with cellular necrosis and no viable carcinoma present at the renal vein margin, other margins negative for carcinoma, 0/3 lymph nodes.  6.5 cm tumor including intraparenchymal lesion and thrombus in the renal vein 8.  Diabetes   9.  Anemia-stool Hemoccults + March 2021, referred to Dr. Benson Norway for endoscopic evaluation, anemia also secondary to metastatic prostate cancer, hypogonadism Small bowel enteroscopy 02/11/2020-esophagus normal.  Hematin found in the gastric body.  Examined duodenum normal.  No evidence of significant pathology in the entire examined portion of the jejunum.  There was no overt source of bleeding or inflammation.  The bleeding may be from small AVMs.  Surgical anastomosis was not visible with repeated inspection of the duodenum. Colonoscopy 02/11/2020-multiple sessile and semipedunculated polyps were found in the rectum, descending colon and ascending colon, 3 to 18 mm in size.  Polyps removed (polypectomy descending colon-fragments of tubular adenoma, inflammatory type polyp; polypectomy ascending colon-tubular adenoma, no high-grade dysplasia or malignancy; polypectomy descending colon-fragments of tubulovillous adenoma, no high-grade dysplasia or malignancy; polypectomy colon, rectum-tubular adenoma, no high-grade dysplasia or malignancy). Colonoscopy 02/02/2021-seven 2 to 10 mm polyps in the sigmoid colon (tubular adenoma), descending colon (fragments of tubulovillous adenoma, fragments of tubular adenoma ) and transverse colon (fragments of tubular adenoma). Progressive anemia 07/05/2021; stool cards positive Upper endoscopy/colonoscopy 09/23/2021-multiple polyps  removed from the colon-tubular adenomas, diffuse angioectasias in the stomach with bleeding-not amenable to endoscopic treatment 10.  Renal failure-progressive 01/17/2022, progressive following right nephrectomy October 2023 11.  CT chest at [facility] 01/13/2022-no evidence of thoracic metastatic disease, similar diffuse sclerotic changes in the skeleton 12.  CT renal stone study 01/14/2022-mild left side hydroureteronephrosis secondary to a left UVJ stone, left nephrolithiasis, new right middle lobe collapse/consolidation, edema/fluid at the left iliopsoas muscle 13.  PE noted on CT 05/05/2022-on apixaban.  He saw Dr. Joan Flores at [facility] 05/16/2022; instructions are to hold apixaban 72 hours prior to scheduled surgery and start Lovenox at prophylactic dosing.     Disposition: Brandon Peters appears stable.  He is in clinical remission from prostate cancer and renal cell carcinoma.  He will continue abiraterone and prednisone.  He will continue apixaban for treatment of the pulmonary embolism diagnosed in August 2023.  He remains off of specific treatment for renal cell carcinoma.  Brandon Peters has experienced improvement in renal function over the past few months.  He will return for an office visit and Lupron in April.  [name], MD  12/09/2022  11:41 AM

## 2022-12-11 LAB — PROSTATE-SPECIFIC AG, SERUM (LABCORP): Prostate Specific Ag, Serum: 0.2 ng/mL (ref 0.0–4.0)

## 2022-12-28 ENCOUNTER — Other Ambulatory Visit (HOSPITAL_COMMUNITY): Payer: Self-pay

## 2023-01-04 ENCOUNTER — Other Ambulatory Visit (HOSPITAL_COMMUNITY): Payer: Self-pay

## 2023-01-05 ENCOUNTER — Other Ambulatory Visit (HOSPITAL_COMMUNITY): Payer: Self-pay

## 2023-01-13 ENCOUNTER — Inpatient Hospital Stay: Payer: Medicare Other | Admitting: Nurse Practitioner

## 2023-01-13 ENCOUNTER — Inpatient Hospital Stay: Payer: Medicare Other | Attending: Oncology

## 2023-01-13 ENCOUNTER — Inpatient Hospital Stay: Payer: Medicare Other

## 2023-01-13 ENCOUNTER — Encounter: Payer: Self-pay | Admitting: Nurse Practitioner

## 2023-01-13 VITALS — BP 137/75 | HR 80 | Temp 98.1°F | Resp 20 | Ht 65.0 in | Wt 141.2 lb

## 2023-01-13 DIAGNOSIS — C641 Malignant neoplasm of right kidney, except renal pelvis: Secondary | ICD-10-CM | POA: Diagnosis not present

## 2023-01-13 DIAGNOSIS — C61 Malignant neoplasm of prostate: Secondary | ICD-10-CM

## 2023-01-13 DIAGNOSIS — Z5111 Encounter for antineoplastic chemotherapy: Secondary | ICD-10-CM | POA: Insufficient documentation

## 2023-01-13 DIAGNOSIS — C7951 Secondary malignant neoplasm of bone: Secondary | ICD-10-CM | POA: Diagnosis present

## 2023-01-13 DIAGNOSIS — Z79899 Other long term (current) drug therapy: Secondary | ICD-10-CM | POA: Diagnosis not present

## 2023-01-13 LAB — CBC WITH DIFFERENTIAL (CANCER CENTER ONLY)
Abs Immature Granulocytes: 0.04 10*3/uL (ref 0.00–0.07)
Basophils Absolute: 0 10*3/uL (ref 0.0–0.1)
Basophils Relative: 0 %
Eosinophils Absolute: 0.2 10*3/uL (ref 0.0–0.5)
Eosinophils Relative: 3 %
HCT: 29.9 % — ABNORMAL LOW (ref 39.0–52.0)
Hemoglobin: 9.2 g/dL — ABNORMAL LOW (ref 13.0–17.0)
Immature Granulocytes: 1 %
Lymphocytes Relative: 15 %
Lymphs Abs: 1.1 10*3/uL (ref 0.7–4.0)
MCH: 29.7 pg (ref 26.0–34.0)
MCHC: 30.8 g/dL (ref 30.0–36.0)
MCV: 96.5 fL (ref 80.0–100.0)
Monocytes Absolute: 0.6 10*3/uL (ref 0.1–1.0)
Monocytes Relative: 8 %
Neutro Abs: 5.5 10*3/uL (ref 1.7–7.7)
Neutrophils Relative %: 73 %
Platelet Count: 243 10*3/uL (ref 150–400)
RBC: 3.1 MIL/uL — ABNORMAL LOW (ref 4.22–5.81)
RDW: 13.5 % (ref 11.5–15.5)
WBC Count: 7.6 10*3/uL (ref 4.0–10.5)
nRBC: 0 % (ref 0.0–0.2)

## 2023-01-13 LAB — CMP (CANCER CENTER ONLY)
ALT: 15 U/L (ref 0–44)
AST: 16 U/L (ref 15–41)
Albumin: 4.1 g/dL (ref 3.5–5.0)
Alkaline Phosphatase: 54 U/L (ref 38–126)
Anion gap: 10 (ref 5–15)
BUN: 46 mg/dL — ABNORMAL HIGH (ref 8–23)
CO2: 23 mmol/L (ref 22–32)
Calcium: 9.8 mg/dL (ref 8.9–10.3)
Chloride: 109 mmol/L (ref 98–111)
Creatinine: 2.05 mg/dL — ABNORMAL HIGH (ref 0.61–1.24)
GFR, Estimated: 35 mL/min — ABNORMAL LOW (ref >60)
Glucose, Bld: 103 mg/dL — ABNORMAL HIGH (ref 70–99)
Potassium: 4 mmol/L (ref 3.5–5.1)
Sodium: 142 mmol/L (ref 135–145)
Total Bilirubin: 0.4 mg/dL (ref 0.3–1.2)
Total Protein: 7.3 g/dL (ref 6.5–8.1)

## 2023-01-13 MED ORDER — LEUPROLIDE ACETATE (4 MONTH) 30 MG ~~LOC~~ KIT
30.0000 mg | PACK | Freq: Once | SUBCUTANEOUS | Status: AC
  Administered 2023-01-13: 30 mg via SUBCUTANEOUS
  Filled 2023-01-13: qty 30

## 2023-01-13 NOTE — Progress Notes (Signed)
[facility] OFFICE PROGRESS NOTE   Diagnosis: Prostate cancer, renal cell carcinoma  INTERVAL HISTORY:   Brandon Peters returns as scheduled.  He continues abiraterone and prednisone.  He feels well.  He denies bleeding.  Appetite described as "great".  He denies pain.  No nausea or vomiting.  Objective:  Vital signs in last 24 hours:  Blood pressure 137/75, pulse 80, temperature 98.1 F (36.7 C), resp. rate 20, height  (1.651 m), weight 141 lb 3.2 oz (64 kg), SpO2 100 %.    HEENT: No thrush. Resp: Lungs clear bilaterally.  Breath sounds diminished right lower lung field.  No respiratory distress. Cardio: Regular rate and rhythm. GI: Abdomen soft and nontender.  No hepatosplenomegaly. Vascular: Trace pitting edema lower leg bilaterally left greater than right.   Lab Results:  Lab Results  Component Value Date   WBC 7.6 01/13/2023   HGB 9.2 (L) 01/13/2023   HCT 29.9 (L) 01/13/2023   MCV 96.5 01/13/2023   PLT 243 01/13/2023   NEUTROABS 5.5 01/13/2023    Imaging:  No results found.  Medications: I have reviewed the patient's current medications.  Assessment/Plan: History of Severe anemia-status post a red cell transfusion 03/04/2016 2.. Prostate cancer Extensive sclerotic bone metastases noted on a CT of the abdomen/pelvis 03/04/2016 and MRI abdomen 03/05/2016 Markedly elevated PSA Lupron initiated 03/15/2016; Casodex 14 days beginning 03/15/2016 Prostate biopsy 03/18/2016 05/09/2016 PSA significantly improved at 21 Abiraterone initiated 05/10/2016 PSA less than 0.1 07/10/2018 PSA less than 0.1 08/30/2018 PSA less than 0.1 10/18/2018 PSA less than 0.1 11/29/2018 PSA less than 0.1 on 11/06/2019  PSA less than 0.1 on 03/05/2020 PSA less than 0.1 on 07/06/2020 PSA less than 0.1 08/17/2021 PSA less than 0.1 on 09/30/2021 PSA 0.1 on 11/12/2021 PSA 0.1 on 02/18/2022 PSA 0.2 on 12/09/2022   3.   Symptoms of obstructive uropathy. Improved   4.   Distal  duodenal and descending colon masses biopsy of the duodenal mass 03/04/2016 confirmed fragments of small bowel mucosa with focal high-grade dysplasia Biopsy of the descending colon "polyps" revealed fragments of a tubulovillous adenoma CT 08/29/2017-similar soft tissue thickening in the region of the splenic flexure of the colon suboptimally evaluated due to lack of colonic enteric contrast Referred to Dr. Elnoria Howard Referred to [facility] Resection of descending colon polyps 01/31/2018, tubulovillous adenomas with high-grade dysplasia Attempted endoscopic resection of the fourth segment duodenal "polyp" on 05/09/2018, firm unresectable lesion, biopsy revealed moderately differentiated adenocarcinoma CT abdomen/pelvis 05/25/2018- heterogeneously enhancing right lower pole renal lesion; diffuse sclerotic osseous metastatic lesions compatible with history of prostate cancer CT chest 05/25/2018- no pulmonary metastasis.  Diffuse osseous metastasis involving the axial and appendicular skeleton. 06/19/2018-sleeve resection of third and fourth portions of duodenum with primary end-to-end anastomosis, feeding gastrojejunostomy tube placement, Dr. Chauncey Mann; poorly differentiated adenocarcinoma duodenum, 3 cm; tumor invades the muscularis propria; lymphovascular invasion present; negative margins; 4 of 11 involved lymph nodes; soft tissue deposits also seen in the mesentery; all mucosal margins and soft tissue margins negative but 2 of the positive nodes are found in the soft tissue margin submitted for frozen section (pT2 pN2); second mucosal lesion 1.2 cm from the proximal margin, tubular adenoma with focal severe dysplasia Intact expression of mismatch repair proteins by IHC; microsatellite stable Cycle 1 adjuvant Xeloda 07/23/2018 Cycle 2 adjuvant Xeloda 08/13/2018 Cycle 3 adjuvant Xeloda 09/10/2018 (dose reduced to 1000 mg twice daily for 14 days)  Cycle 4 adjuvant Xeloda 10/01/2018 (dose increased to 1500 mg every morning  and  1000 mg every afternoon for 14 days) Cycle 5 adjuvant Xeloda 10/22/2018 Cycle 6 adjuvant Xeloda 11/12/2018 Cycle 7 adjuvant Xeloda 12/03/2018 Cycle 8 adjuvant Xeloda 12/24/2018   5.  Anorexia/weight loss- resolved   6.  Undescended testicles   7.  Right renal mass.  CT 08/29/2017-right renal mass involving the lower pole was partially calcified and does demonstrate enhancement following contrast most consistent with renal cell carcinoma.  Lesion not significantly enlarged compared with the prior CT. Followed by urology. 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 to the IVC/RA junction, upper normal size retrocaval node 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, nuclear grade 3 Axitinib/pembrolizumab 10/22/2021 Axitinib placed on hold 11/05/2021 due to hand-foot syndrome Axitinib resumed at a dose of 5 mg daily 11/12/2021, pembrolizumab 11/12/2021 Axitinib discontinued 12/03/2021 secondary to progressive hand-foot syndrome, pembrolizumab 12/03/2021 Axitinib resumed at a reduced dose of 2 mg twice daily after office visit 12/14/2021 Pembrolizumab 12/24/2021 Axitinib stopped 01/03/2022 secondary to persistent hand/foot symptoms CT chest at [facility] 01/13/2022-no evidence of thoracic metastatic disease, similar diffuse sclerotic change in the skeleton MRI abdomen at [facility] 01/13/2022-similar size of right lower pole renal mass with renal vein thrombosis, decreased overall extent of  thrombus now extending to the intrahepatic IVC, enhancement of right renal mass and thrombus is decreased, new left hydroureter ureter nephrosis with asymmetric perinephric stranding Pembrolizumab 01/27/2022 Axitinib resumed 2 mg daily 01/28/2022 CT abdomen/pelvis at [facility] 02/10/2022-overall reduced size of right lower pole renal mass which demonstrates persistent region of enhancement along the posterior and lateral margin with extension into the renal sinus.  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  Query additional IVC thrombus at the intrahepatic IVC and extending into the right atrium. Axitinib continued 2 mg daily, Pembrolizumab every 3 weeks CT 05/05/2022 at [facility]-partially visualized pulmonary embolism involving segmental arteries of the right lower lobe.  Interval slight decrease in size of the mass arising from the lower pole the right kidney.  Mass demonstrates persistent moderate necrosis centrally.  Increased enhancing soft tissue component measuring up to 2 cm which is mildly increased in size compared to prior from 02/10/2022.  Bland tumor thrombus identified in the IVC at the level of the right renal vein ostium extending into the suprarenal IVC superiorly.  No obvious thrombus identified in the infrarenal, intrahepatic and suprahepatic IVC.  Likely trace bland thrombus in the anterior right renal vein.  Stable several subcentimeter retroperitoneal lymph nodes.  Diffuse osseous sclerotic and lytic metastatic disease stable compared to prior. Axitinib placed on hold 05/04/2022 due to pain bilateral feet; 05/13/2022 he will continue to hold axitinib Pembrolizumab 05/13/2022 Pembrolizumab 06/03/2022 Robotic right radical nephrectomy and IVC thrombectomy 06/27/2022-right kidney with tumor necrosis/hemorrhage,ypT0, adherent thrombus with cellular necrosis and no viable carcinoma present at the renal vein margin, other margins negative for carcinoma, 0/3 lymph nodes.  6.5 cm tumor  including intraparenchymal lesion and thrombus in the renal vein 8.  Diabetes   9.  Anemia-stool Hemoccults + March 2021, referred to Dr. Elnoria Howard for endoscopic evaluation, anemia  also secondary to metastatic prostate cancer, hypogonadism Small bowel enteroscopy 02/11/2020-esophagus normal.  Hematin found in the gastric body.  Examined duodenum normal.  No evidence of significant pathology in the entire examined portion of the jejunum.  There was no overt source of bleeding or inflammation.  The bleeding may be from small AVMs.  Surgical anastomosis was not visible with repeated inspection of the duodenum. Colonoscopy 02/11/2020-multiple sessile and semipedunculated polyps were found in the rectum, descending colon and ascending colon, 3 to 18 mm in size.  Polyps removed (polypectomy descending colon-fragments of tubular adenoma, inflammatory type polyp; polypectomy ascending colon-tubular adenoma, no high-grade dysplasia or malignancy; polypectomy descending colon-fragments of tubulovillous adenoma, no high-grade dysplasia or malignancy; polypectomy colon, rectum-tubular adenoma, no high-grade dysplasia or malignancy). Colonoscopy 02/02/2021-seven 2 to 10 mm polyps in the sigmoid colon (tubular adenoma), descending colon (fragments of tubulovillous adenoma, fragments of tubular adenoma ) and transverse colon (fragments of tubular adenoma). Progressive anemia 07/05/2021; stool cards positive Upper endoscopy/colonoscopy 09/23/2021-multiple polyps removed from the colon-tubular adenomas, diffuse angioectasias in the stomach with bleeding-not amenable to endoscopic treatment 10.  Renal failure-progressive 01/17/2022, progressive following right nephrectomy October 2023 11.  CT chest at [facility] 01/13/2022-no evidence of thoracic metastatic disease, similar diffuse sclerotic changes in the skeleton 12.  CT renal stone study 01/14/2022-mild left side hydroureteronephrosis secondary to a left UVJ stone, left nephrolithiasis,  new right middle lobe collapse/consolidation, edema/fluid at the left iliopsoas muscle 13.  PE noted on CT 05/05/2022-on apixaban.  He saw Dr. Isaiah Serge at [facility] 05/16/2022; instructions are to hold apixaban 72 hours prior to scheduled surgery and start Lovenox at prophylactic dosing.    Disposition: Mr. [place] appears stable.  He remains in clinical remission from prostate cancer and renal cell carcinoma.  We will continue abiraterone and prednisone.  We will follow-up on the PSA from today.  He will receive a Lupron injection today.  He will continue apixaban for treatment of the pulmonary embolism diagnosed August 2023.  He continues follow-up with nephrology.  He will return for lab and follow-up in 6 weeks.    Lonna Cobb ANP/GNP-BC   01/13/2023  10:28 AM

## 2023-01-13 NOTE — Patient Instructions (Signed)
Leuprolide Suspension for Injection (Prostate Cancer) What is this medication? LEUPROLIDE (loo PROE lide) reduces the symptoms of prostate cancer. It works by decreasing levels of the hormone testosterone in the body. This prevents prostate cancer cells from spreading or growing. This medicine may be used for other purposes; ask your health care provider or pharmacist if you have questions. COMMON BRAND NAME(S): Eligard, Lupron Depot, Lupron Depot-Ped, Lutrate Depot, Viadur What should I tell my care team before I take this medication? They need to know if you have any of these conditions: Diabetes Heart disease Heart failure High or low levels of electrolytes, such as magnesium, potassium, or sodium in your blood Irregular heartbeat or rhythm Seizures An unusual or allergic reaction to leuprolide, other medications, foods, dyes, or preservatives Pregnant or trying to get pregnant Breast-feeding How should I use this medication? This medication is injected under the skin or into a muscle. It is given by your care team in a hospital or clinic setting. Talk to your care team about the use of this medication in children. Special care may be needed. Overdosage: If you think you have taken too much of this medicine contact a poison control center or emergency room at once. NOTE: This medicine is only for you. Do not share this medicine with others. What if I miss a dose? Keep appointments for follow-up doses. It is important not to miss your dose. Call your care team if you are unable to keep an appointment. What may interact with this medication? Do not take this medication with any of the following: Cisapride Dronedarone Ketoconazole Levoketoconazole Pimozide Thioridazine This medication may also interact with the following: Other medications that cause heart rhythm changes This list may not describe all possible interactions. Give your health care provider a list of all the medicines,  herbs, non-prescription drugs, or dietary supplements you use. Also tell them if you smoke, drink alcohol, or use illegal drugs. Some items may interact with your medicine. What should I watch for while using this medication? Visit your care team for regular checks on your progress. Tell your care team if your symptoms do not start to get better or if they get worse. This medication may increase blood sugar. The risk may be higher in patients who already have diabetes. Ask your care team what you can do to lower the risk of diabetes while taking this medication. This medication may cause infertility. Talk to your care team if you are concerned about your fertility. Heart attacks and strokes have been reported with the use of this medication. Get emergency help if you develop signs or symptoms of a heart attack or stroke. Talk to your care team about the risks and benefits of this medication. What side effects may I notice from receiving this medication? Side effects that you should report to your care team as soon as possible: Allergic reactions--skin rash, itching, hives, swelling of the face, lips, tongue, or throat Heart attack--pain or tightness in the chest, shoulders, arms, or jaw, nausea, shortness of breath, cold or clammy skin, feeling faint or lightheaded Heart rhythm changes--fast or irregular heartbeat, dizziness, feeling faint or lightheaded, chest pain, trouble breathing High blood sugar (hyperglycemia)--increased thirst or amount of urine, unusual weakness or fatigue, blurry vision Mood swings, irritability, hostility Seizures Stroke--sudden numbness or weakness of the face, arm, or leg, trouble speaking, confusion, trouble walking, loss of balance or coordination, dizziness, severe headache, change in vision Thoughts of suicide or self-harm, worsening mood, feelings of depression Side   effects that usually do not require medical attention (report to your care team if they continue or  are bothersome): Bone pain Change in sex drive or performance General discomfort and fatigue Hot flashes Muscle pain Pain, redness, or irritation at injection site Swelling of the ankles, hands, or feet This list may not describe all possible side effects. Call your doctor for medical advice about side effects. You may report side effects to FDA at 1-800-FDA-1088. Where should I keep my medication? This medication is given in a hospital or clinic. It will not be stored at home. NOTE: This sheet is a summary. It may not cover all possible information. If you have questions about this medicine, talk to your doctor, pharmacist, or health care provider.  2023 Elsevier/Gold Standard (2021-11-22 00:00:00)  

## 2023-01-15 LAB — PROSTATE-SPECIFIC AG, SERUM (LABCORP): Prostate Specific Ag, Serum: 0.2 ng/mL (ref 0.0–4.0)

## 2023-01-17 IMAGING — US US RENAL
1 series · 14 of 25 positions shown · non-contrast
Comparison: CT abdomen pelvis 12/02/2019, MRI abdomen 03/05/2016,
CT abdomen pelvis 08/29/2017

CLINICAL DATA: Prostate cancer, elevated creatinine.

EXAM:
RENAL / URINARY TRACT ULTRASOUND COMPLETE

[Series 1: us renal · 38 acquisitions, 14 frames shown]
[im 1/38]
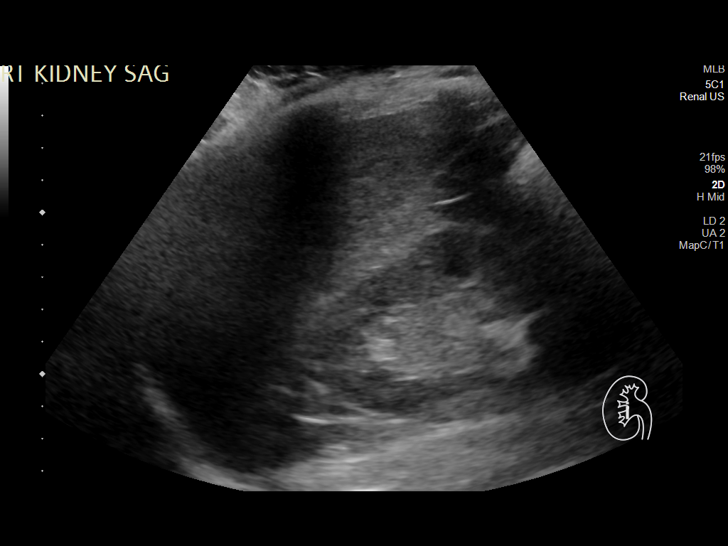
[im 4/38]
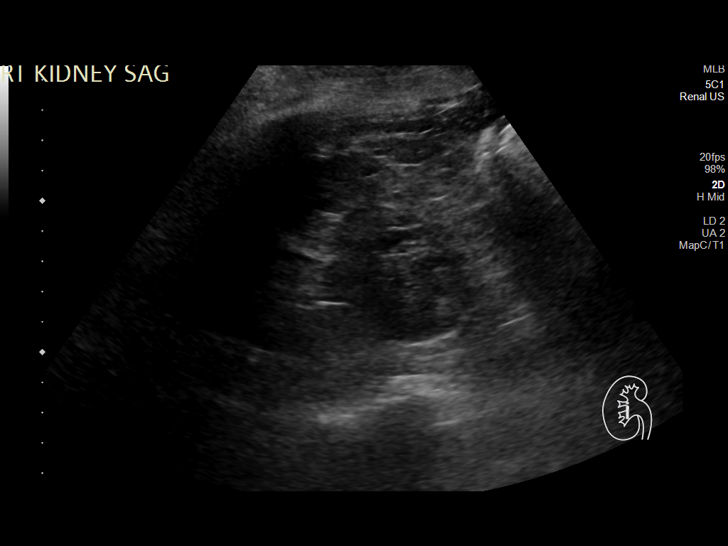
[im 7/38]
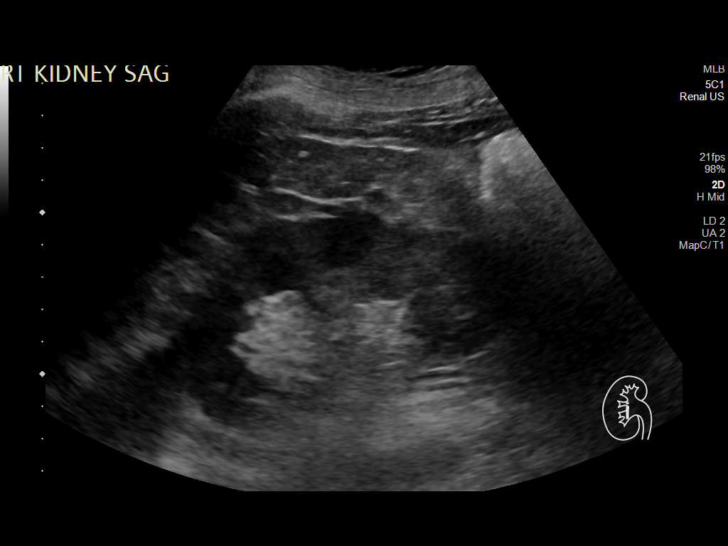
[im 10/38]
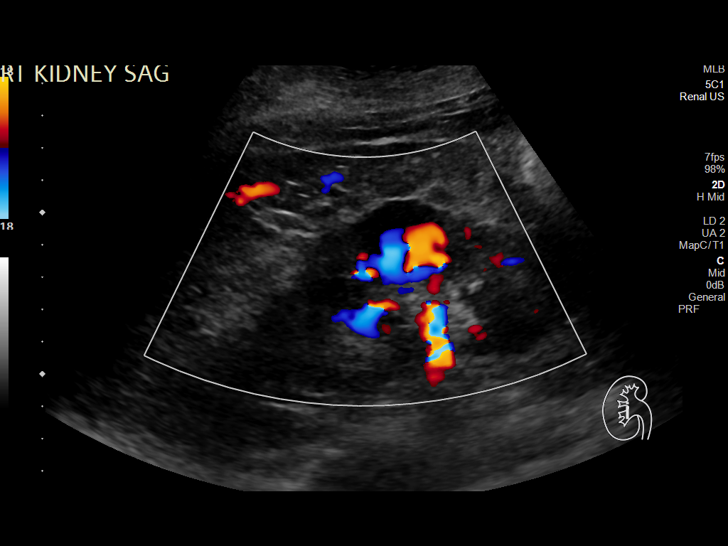
[im 13/38]
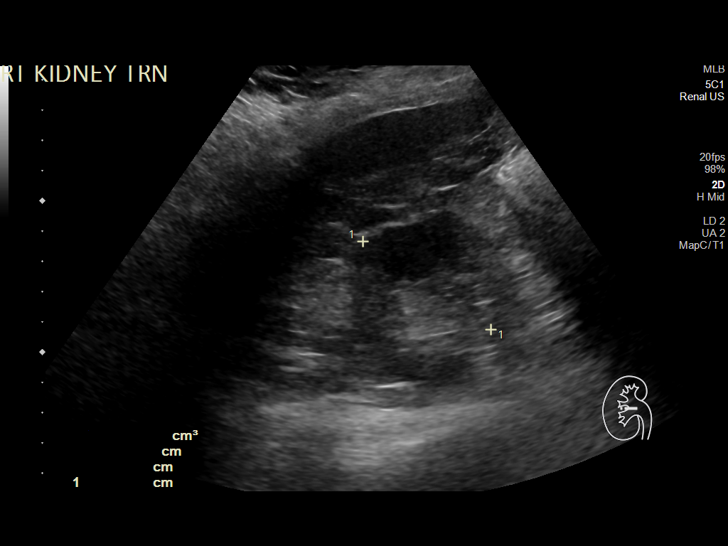
[im 14/38]
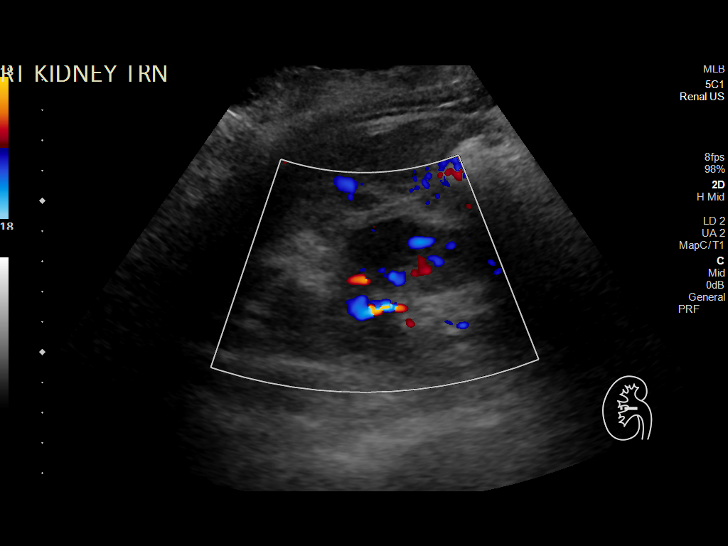
[im 17/38]
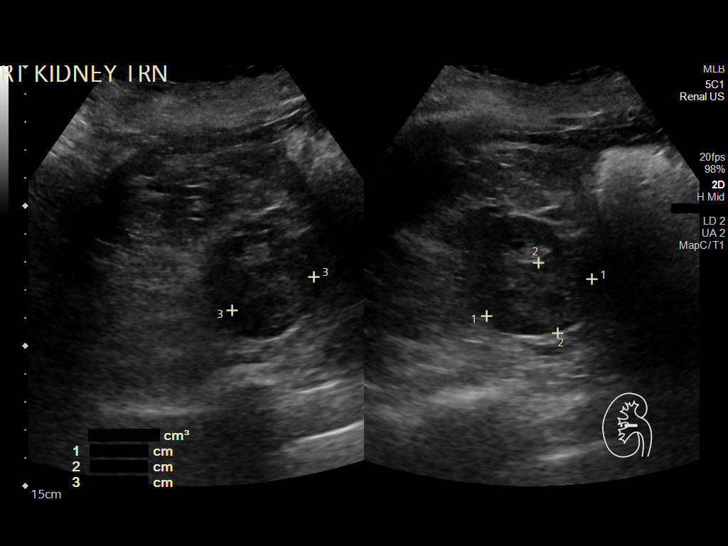
[im 21/38]
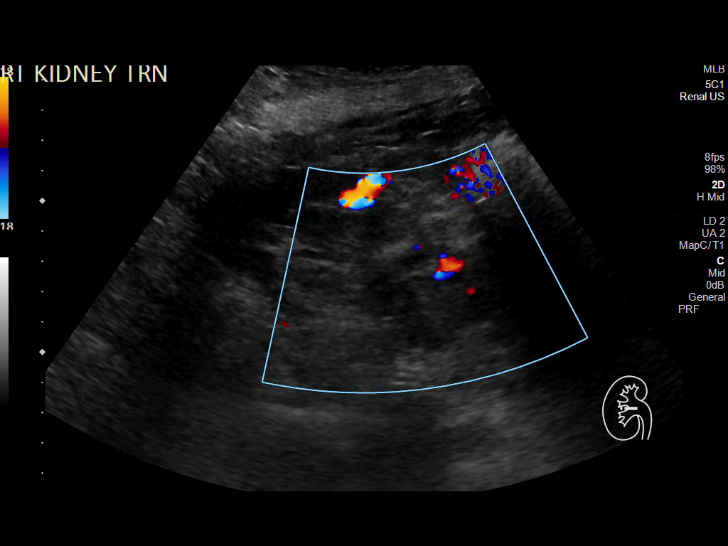
[im 24/38]
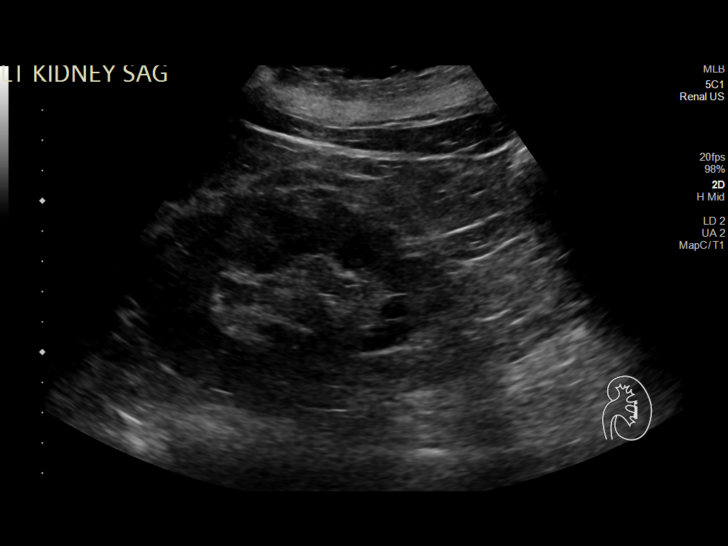
[im 25/38]
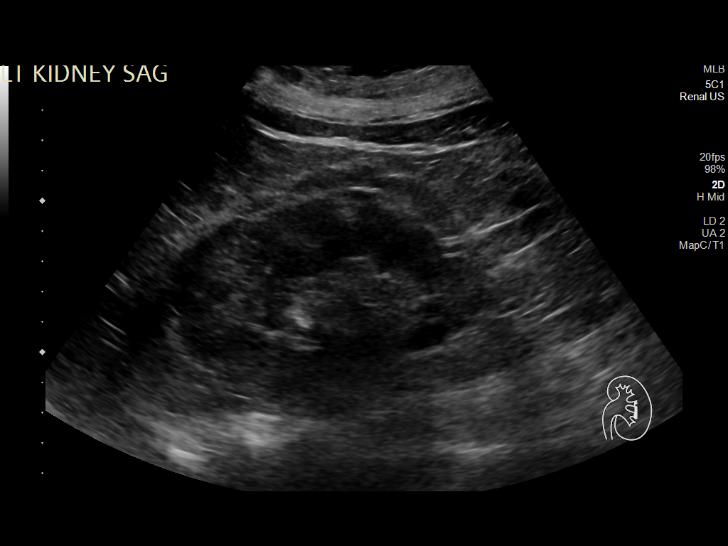
[im 28/38]
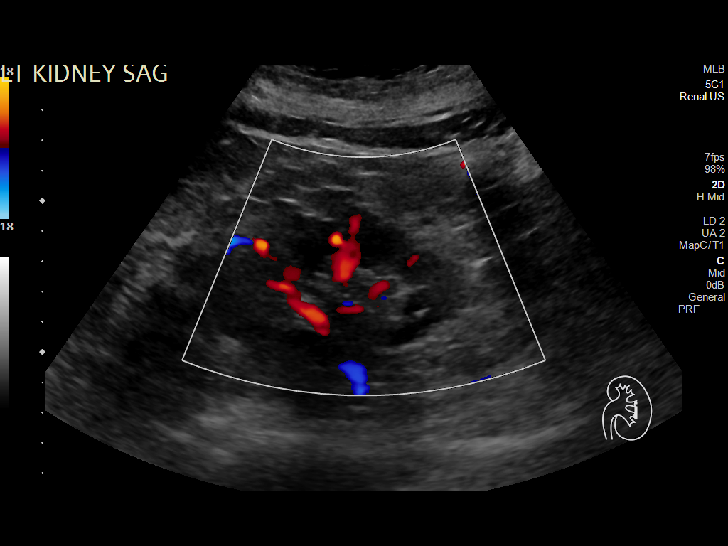
[im 31/38]
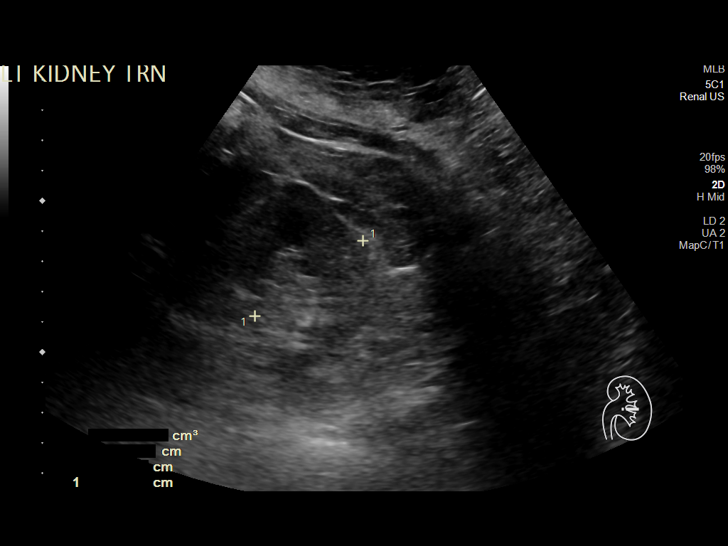
[im 34/38]
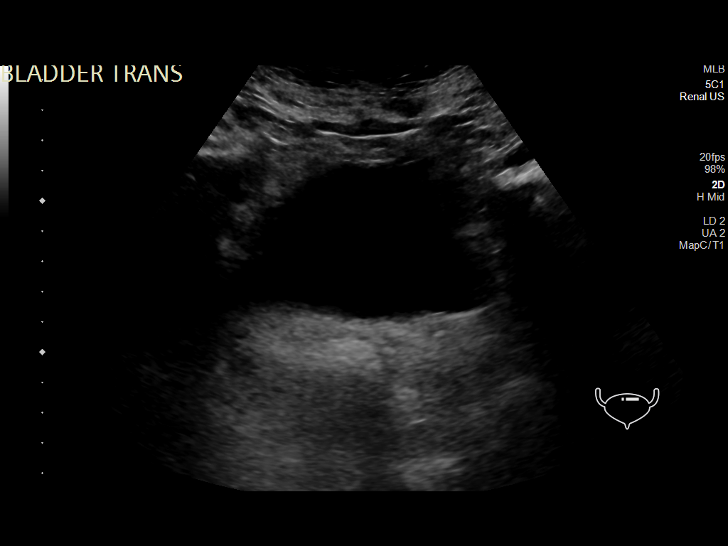
[im 38/38]
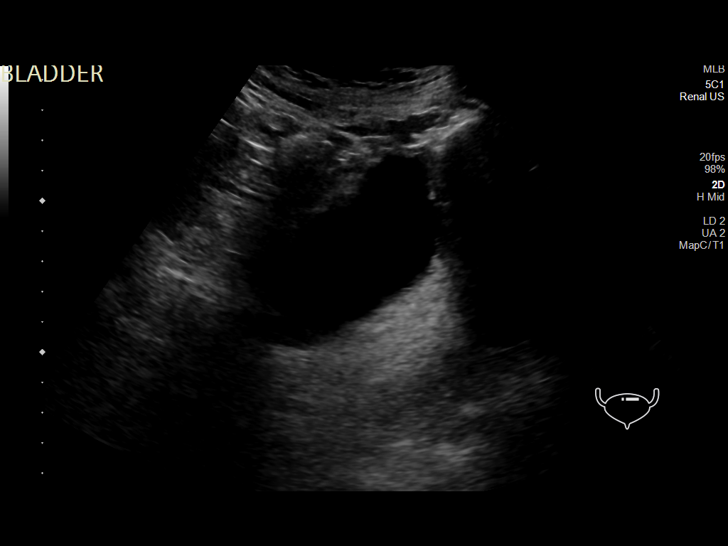

[14 of 25 positions shown; findings below may reference images not displayed]

FINDINGS: Right Kidney:

Renal measurements: 11.4 x 5.3 x 5.1 cm = volume: 162 mL.
Echogenicity within normal limits. Interval increase in size of a
solid right renal mass measuring 4 x 2.6 x 3.2 cm. No hydronephrosis
visualized.

Left Kidney:

Renal measurements: 10.7 x 5.3 x 4.4 cm = volume: 130 mL.
Echogenicity within normal limits. No mass or hydronephrosis
visualized.

Bladder:

Appears normal for degree of bladder distention.

Other:

None.
IMPRESSION: Interval increase in size of a 4 x 2.6 x 3.2 cm renal cell
carcinoma. Recommend urologic consultation.

These results will be called to the ordering clinician or
representative by the Radiologist Assistant, and communication
documented in the PACS or [REDACTED].

## 2023-01-31 ENCOUNTER — Other Ambulatory Visit (HOSPITAL_COMMUNITY): Payer: Self-pay

## 2023-02-02 ENCOUNTER — Other Ambulatory Visit: Payer: Self-pay

## 2023-02-06 ENCOUNTER — Other Ambulatory Visit: Payer: Self-pay

## 2023-02-23 ENCOUNTER — Inpatient Hospital Stay: Payer: Medicare Other | Admitting: Oncology

## 2023-02-23 ENCOUNTER — Inpatient Hospital Stay: Payer: Medicare Other | Attending: Oncology

## 2023-02-23 ENCOUNTER — Encounter: Payer: Self-pay | Admitting: *Deleted

## 2023-02-23 VITALS — BP 145/77 | HR 83 | Temp 97.8°F | Resp 18 | Wt 144.7 lb

## 2023-02-23 DIAGNOSIS — E119 Type 2 diabetes mellitus without complications: Secondary | ICD-10-CM | POA: Insufficient documentation

## 2023-02-23 DIAGNOSIS — C641 Malignant neoplasm of right kidney, except renal pelvis: Secondary | ICD-10-CM | POA: Insufficient documentation

## 2023-02-23 DIAGNOSIS — Z905 Acquired absence of kidney: Secondary | ICD-10-CM | POA: Diagnosis not present

## 2023-02-23 DIAGNOSIS — C7951 Secondary malignant neoplasm of bone: Secondary | ICD-10-CM | POA: Diagnosis present

## 2023-02-23 DIAGNOSIS — N289 Disorder of kidney and ureter, unspecified: Secondary | ICD-10-CM | POA: Diagnosis not present

## 2023-02-23 DIAGNOSIS — L84 Corns and callosities: Secondary | ICD-10-CM | POA: Insufficient documentation

## 2023-02-23 DIAGNOSIS — Q5320 Undescended testicle, unspecified, bilateral: Secondary | ICD-10-CM | POA: Insufficient documentation

## 2023-02-23 DIAGNOSIS — Z7901 Long term (current) use of anticoagulants: Secondary | ICD-10-CM | POA: Diagnosis not present

## 2023-02-23 DIAGNOSIS — I2699 Other pulmonary embolism without acute cor pulmonale: Secondary | ICD-10-CM | POA: Insufficient documentation

## 2023-02-23 DIAGNOSIS — C61 Malignant neoplasm of prostate: Secondary | ICD-10-CM | POA: Insufficient documentation

## 2023-02-23 LAB — CMP (CANCER CENTER ONLY)
ALT: 13 U/L (ref 0–44)
AST: 15 U/L (ref 15–41)
Albumin: 4.3 g/dL (ref 3.5–5.0)
Alkaline Phosphatase: 48 U/L (ref 38–126)
Anion gap: 9 (ref 5–15)
BUN: 46 mg/dL — ABNORMAL HIGH (ref 8–23)
CO2: 24 mmol/L (ref 22–32)
Calcium: 9.6 mg/dL (ref 8.9–10.3)
Chloride: 109 mmol/L (ref 98–111)
Creatinine: 2.38 mg/dL — ABNORMAL HIGH (ref 0.61–1.24)
GFR, Estimated: 30 mL/min — ABNORMAL LOW (ref >60)
Glucose, Bld: 87 mg/dL (ref 70–99)
Potassium: 3.9 mmol/L (ref 3.5–5.1)
Sodium: 142 mmol/L (ref 135–145)
Total Bilirubin: 0.5 mg/dL (ref 0.3–1.2)
Total Protein: 7.3 g/dL (ref 6.5–8.1)

## 2023-02-23 LAB — CBC WITH DIFFERENTIAL (CANCER CENTER ONLY)
Abs Immature Granulocytes: 0.04 10*3/uL (ref 0.00–0.07)
Basophils Absolute: 0 10*3/uL (ref 0.0–0.1)
Basophils Relative: 0 %
Eosinophils Absolute: 0.1 10*3/uL (ref 0.0–0.5)
Eosinophils Relative: 1 %
HCT: 33.5 % — ABNORMAL LOW (ref 39.0–52.0)
Hemoglobin: 10.2 g/dL — ABNORMAL LOW (ref 13.0–17.0)
Immature Granulocytes: 1 %
Lymphocytes Relative: 22 %
Lymphs Abs: 1.6 10*3/uL (ref 0.7–4.0)
MCH: 29.7 pg (ref 26.0–34.0)
MCHC: 30.4 g/dL (ref 30.0–36.0)
MCV: 97.4 fL (ref 80.0–100.0)
Monocytes Absolute: 0.7 10*3/uL (ref 0.1–1.0)
Monocytes Relative: 9 %
Neutro Abs: 4.8 10*3/uL (ref 1.7–7.7)
Neutrophils Relative %: 67 %
Platelet Count: 201 10*3/uL (ref 150–400)
RBC: 3.44 MIL/uL — ABNORMAL LOW (ref 4.22–5.81)
RDW: 13.9 % (ref 11.5–15.5)
WBC Count: 7.2 10*3/uL (ref 4.0–10.5)
nRBC: 0 % (ref 0.0–0.2)

## 2023-02-23 NOTE — Progress Notes (Signed)
[facility] OFFICE PROGRESS NOTE   Diagnosis: Prostate cancer, renal cell carcinoma  INTERVAL HISTORY:   Brandon Peters returns as scheduled.  He continues abiraterone and prednisone.  He feels well.  He has developed a painful area at the sole of the left foot.  No other complaint.  Objective:  Vital signs in last 24 hours:  Blood pressure (!) 145/77, pulse 83, temperature 97.8 F (36.6 C), temperature source Oral, resp. rate 18, weight 144 lb 11.2 oz (65.6 kg), SpO2 100 %.    Lymphatics: No cervical, supraclavicular, axillary, or inguinal nodes Resp: Lungs clear bilaterally Cardio: Regular rate and rhythm GI: No hepatosplenomegaly, nontender, no mass Vascular: No leg edema  Skin: Tender hyperpigmented 2-3 cm callus at the mid plantar surface of the left foot   Lab Results:  Lab Results  Component Value Date   WBC 7.2 02/23/2023   HGB 10.2 (L) 02/23/2023   HCT 33.5 (L) 02/23/2023   MCV 97.4 02/23/2023   PLT 201 02/23/2023   NEUTROABS 4.8 02/23/2023    CMP  Lab Results  Component Value Date   NA 142 01/13/2023   K 4.0 01/13/2023   CL 109 01/13/2023   CO2 23 01/13/2023   GLUCOSE 103 (H) 01/13/2023   BUN 46 (H) 01/13/2023   CREATININE 2.05 (H) 01/13/2023   CALCIUM 9.8 01/13/2023   PROT 7.3 01/13/2023   ALBUMIN 4.1 01/13/2023   AST 16 01/13/2023   ALT 15 01/13/2023   ALKPHOS 54 01/13/2023   BILITOT 0.4 01/13/2023   GFRNONAA 35 (L) 01/13/2023   GFRAA >60 03/05/2020    Lab Results  Component Value Date   CEA1 3.31 05/03/2019    Medications: I have reviewed the patient's current medications.   Assessment/Plan:  History of Severe anemia-status post a red cell transfusion 03/04/2016 2.. Prostate cancer Extensive sclerotic bone metastases noted on a CT of the abdomen/pelvis 03/04/2016 and MRI abdomen 03/05/2016 Markedly elevated PSA Lupron initiated 03/15/2016; Casodex 14 days beginning 03/15/2016 Prostate biopsy 03/18/2016 05/09/2016 PSA  significantly improved at 21 Abiraterone initiated 05/10/2016 PSA less than 0.1 07/10/2018 PSA less than 0.1 08/30/2018 PSA less than 0.1 10/18/2018 PSA less than 0.1 11/29/2018 PSA less than 0.1 on 11/06/2019  PSA less than 0.1 on 03/05/2020 PSA less than 0.1 on 07/06/2020 PSA less than 0.1 08/17/2021 PSA less than 0.1 on 09/30/2021 PSA 0.1 on 11/12/2021 PSA 0.1 on 02/18/2022 PSA 0.2 on 12/09/2022   3.   Symptoms of obstructive uropathy. Improved   4.   Distal duodenal and descending colon masses biopsy of the duodenal mass 03/04/2016 confirmed fragments of small bowel mucosa with focal high-grade dysplasia Biopsy of the descending colon "polyps" revealed fragments of a tubulovillous adenoma CT 08/29/2017-similar soft tissue thickening in the region of the splenic flexure of the colon suboptimally evaluated due to lack of colonic enteric contrast Referred to Dr. Elnoria Howard Referred to [facility] Resection of descending colon polyps 01/31/2018, tubulovillous adenomas with high-grade dysplasia Attempted endoscopic resection of the fourth segment duodenal "polyp" on 05/09/2018, firm unresectable lesion, biopsy revealed moderately differentiated adenocarcinoma CT abdomen/pelvis 05/25/2018- heterogeneously enhancing right lower pole renal lesion; diffuse sclerotic osseous metastatic lesions compatible with history of prostate cancer CT chest 05/25/2018- no pulmonary metastasis.  Diffuse osseous metastasis involving the axial and appendicular skeleton. 06/19/2018-sleeve resection of third and fourth portions of duodenum with primary end-to-end anastomosis, feeding gastrojejunostomy tube placement, Dr. Chauncey Mann; poorly differentiated adenocarcinoma duodenum, 3 cm; tumor invades the muscularis propria; lymphovascular invasion present; negative margins;  4 of 11 involved lymph nodes; soft tissue deposits also seen in the mesentery; all mucosal margins and soft tissue margins negative but 2 of the positive nodes are found in the  soft tissue margin submitted for frozen section (pT2 pN2); second mucosal lesion 1.2 cm from the proximal margin, tubular adenoma with focal severe dysplasia Intact expression of mismatch repair proteins by IHC; microsatellite stable Cycle 1 adjuvant Xeloda 07/23/2018 Cycle 2 adjuvant Xeloda 08/13/2018 Cycle 3 adjuvant Xeloda 09/10/2018 (dose reduced to 1000 mg twice daily for 14 days)  Cycle 4 adjuvant Xeloda 10/01/2018 (dose increased to 1500 mg every morning and 1000 mg every afternoon for 14 days) Cycle 5 adjuvant Xeloda 10/22/2018 Cycle 6 adjuvant Xeloda 11/12/2018 Cycle 7 adjuvant Xeloda 12/03/2018 Cycle 8 adjuvant Xeloda 12/24/2018   5.  Anorexia/weight loss- resolved   6.  Undescended testicles   7.  Right renal mass.  CT 08/29/2017-right renal mass involving the lower pole was partially calcified and does demonstrate enhancement following contrast most consistent with renal cell carcinoma.  Lesion not significantly enlarged compared with the prior CT. Followed by urology. 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 to the IVC/RA junction, upper normal size retrocaval node 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, nuclear grade 3 Axitinib/pembrolizumab 10/22/2021 Axitinib placed on hold 11/05/2021 due to hand-foot syndrome Axitinib resumed at a dose of 5 mg daily 11/12/2021, pembrolizumab 11/12/2021 Axitinib discontinued 12/03/2021 secondary to progressive  hand-foot syndrome, pembrolizumab 12/03/2021 Axitinib resumed at a reduced dose of 2 mg twice daily after office visit 12/14/2021 Pembrolizumab 12/24/2021 Axitinib stopped 01/03/2022 secondary to persistent hand/foot symptoms CT chest at [facility] 01/13/2022-no evidence of thoracic metastatic disease, similar diffuse sclerotic change in the skeleton MRI abdomen at [facility] 01/13/2022-similar size of right lower pole renal mass with renal vein thrombosis, decreased overall extent of thrombus now extending to the intrahepatic IVC, enhancement of right renal mass and thrombus is decreased, new left hydroureter ureter nephrosis with asymmetric perinephric stranding Pembrolizumab 01/27/2022 Axitinib resumed 2 mg daily 01/28/2022 CT abdomen/pelvis at [facility] 02/10/2022-overall reduced size of right lower pole renal mass which demonstrates persistent region of enhancement along the posterior and lateral margin with extension into the renal sinus.  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  Query additional IVC thrombus at the intrahepatic IVC and extending into the right atrium. Axitinib continued 2 mg daily, Pembrolizumab every 3 weeks CT 05/05/2022 at [facility]-partially visualized pulmonary embolism involving segmental arteries of the right lower lobe.  Interval slight decrease in size of the mass arising from the lower pole the right kidney.  Mass demonstrates persistent moderate necrosis centrally.  Increased enhancing soft tissue component measuring up to 2 cm which is mildly increased in size compared to prior from 02/10/2022.  Bland tumor thrombus identified in the IVC at the level of the right renal vein ostium extending into the suprarenal IVC superiorly.  No obvious thrombus identified in the infrarenal, intrahepatic and suprahepatic IVC.  Likely trace bland thrombus in the anterior right renal vein.  Stable several subcentimeter retroperitoneal  lymph nodes.  Diffuse osseous sclerotic and lytic metastatic  disease stable compared to prior. Axitinib placed on hold 05/04/2022 due to pain bilateral feet; 05/13/2022 he will continue to hold axitinib Pembrolizumab 05/13/2022 Pembrolizumab 06/03/2022 Robotic right radical nephrectomy and IVC thrombectomy 06/27/2022-right kidney with tumor necrosis/hemorrhage,ypT0, adherent thrombus with cellular necrosis and no viable carcinoma present at the renal vein margin, other margins negative for carcinoma, 0/3 lymph nodes.  6.5 cm tumor including intraparenchymal lesion and thrombus in the renal vein 8.  Diabetes   9.  Anemia-stool Hemoccults + March 2021, referred to Dr. Elnoria Howard for endoscopic evaluation, anemia also secondary to metastatic prostate cancer, hypogonadism Small bowel enteroscopy 02/11/2020-esophagus normal.  Hematin found in the gastric body.  Examined duodenum normal.  No evidence of significant pathology in the entire examined portion of the jejunum.  There was no overt source of bleeding or inflammation.  The bleeding may be from small AVMs.  Surgical anastomosis was not visible with repeated inspection of the duodenum. Colonoscopy 02/11/2020-multiple sessile and semipedunculated polyps were found in the rectum, descending colon and ascending colon, 3 to 18 mm in size.  Polyps removed (polypectomy descending colon-fragments of tubular adenoma, inflammatory type polyp; polypectomy ascending colon-tubular adenoma, no high-grade dysplasia or malignancy; polypectomy descending colon-fragments of tubulovillous adenoma, no high-grade dysplasia or malignancy; polypectomy colon, rectum-tubular adenoma, no high-grade dysplasia or malignancy). Colonoscopy 02/02/2021-seven 2 to 10 mm polyps in the sigmoid colon (tubular adenoma), descending colon (fragments of tubulovillous adenoma, fragments of tubular adenoma ) and transverse colon (fragments of tubular adenoma). Progressive anemia 07/05/2021; stool cards positive Upper endoscopy/colonoscopy 09/23/2021-multiple polyps  removed from the colon-tubular adenomas, diffuse angioectasias in the stomach with bleeding-not amenable to endoscopic treatment 10.  Renal failure-progressive 01/17/2022, progressive following right nephrectomy October 2023 11.  CT chest at [facility] 01/13/2022-no evidence of thoracic metastatic disease, similar diffuse sclerotic changes in the skeleton 12.  CT renal stone study 01/14/2022-mild left side hydroureteronephrosis secondary to a left UVJ stone, left nephrolithiasis, new right middle lobe collapse/consolidation, edema/fluid at the left iliopsoas muscle 13.  PE noted on CT 05/05/2022-on apixaban.  He saw Dr. Isaiah Serge at [facility] 05/16/2022; instructions are to hold apixaban 72 hours prior to scheduled surgery and start Lovenox at prophylactic dosing.    Disposition: Mr. Toler appears stable.  He is in clinical remission from renal cell carcinoma and prostate cancer.  He will continue abiraterone and prednisone.  He last received leuprolide 01/13/2023 and will continue leuprolide every 4 months.  He has renal insufficiency following the nephrectomy.  He plans to schedule a follow-up appointment with nephrology for within the next 1-2 months.  We will refer him to podiatry to evaluate the tender left foot callus.  Mr. Lorenzen will return for an office and lab visit in 6 weeks.  Thornton Papas, MD  02/23/2023  9:26 AM

## 2023-02-23 NOTE — Progress Notes (Signed)
Faxed demographics, referral order and chart information to [facility] [phone]

## 2023-02-24 ENCOUNTER — Encounter: Payer: Self-pay | Admitting: Oncology

## 2023-02-25 LAB — PROSTATE-SPECIFIC AG, SERUM (LABCORP): Prostate Specific Ag, Serum: 0.2 ng/mL (ref 0.0–4.0)

## 2023-02-27 ENCOUNTER — Other Ambulatory Visit (HOSPITAL_COMMUNITY): Payer: Self-pay

## 2023-03-02 ENCOUNTER — Other Ambulatory Visit (HOSPITAL_COMMUNITY): Payer: Self-pay

## 2023-03-03 ENCOUNTER — Ambulatory Visit: Payer: Medicare Other | Admitting: Podiatry

## 2023-03-03 ENCOUNTER — Encounter: Payer: Self-pay | Admitting: Podiatry

## 2023-03-03 DIAGNOSIS — D689 Coagulation defect, unspecified: Secondary | ICD-10-CM | POA: Diagnosis not present

## 2023-03-03 DIAGNOSIS — L84 Corns and callosities: Secondary | ICD-10-CM | POA: Diagnosis not present

## 2023-03-03 DIAGNOSIS — M79675 Pain in left toe(s): Secondary | ICD-10-CM

## 2023-03-03 DIAGNOSIS — E1149 Type 2 diabetes mellitus with other diabetic neurological complication: Secondary | ICD-10-CM | POA: Diagnosis not present

## 2023-03-03 DIAGNOSIS — B351 Tinea unguium: Secondary | ICD-10-CM | POA: Diagnosis not present

## 2023-03-03 DIAGNOSIS — E1140 Type 2 diabetes mellitus with diabetic neuropathy, unspecified: Secondary | ICD-10-CM | POA: Diagnosis not present

## 2023-03-03 DIAGNOSIS — M79674 Pain in right toe(s): Secondary | ICD-10-CM

## 2023-03-03 DIAGNOSIS — D124 Benign neoplasm of descending colon: Secondary | ICD-10-CM | POA: Insufficient documentation

## 2023-03-03 DIAGNOSIS — D509 Iron deficiency anemia, unspecified: Secondary | ICD-10-CM | POA: Insufficient documentation

## 2023-03-03 DIAGNOSIS — R195 Other fecal abnormalities: Secondary | ICD-10-CM | POA: Insufficient documentation

## 2023-03-05 NOTE — Progress Notes (Signed)
Subjective:   Patient ID: Brandon Peters, male   DOB: 67 y.o.   MRN: [id number]   HPI Patient presents very poor health with elongated nailbeds 1-5 both feet that he cannot take care of long-term diabetes on blood thinner with lesion plantar left and right that are painful.  Patient does not smoke likes to be active and presents with caregiver   Review of Systems  All other systems reviewed and are negative.       Objective:  Physical Exam Vitals and nursing note reviewed.  Constitutional:      Appearance: He is well-developed.  Pulmonary:     Effort: Pulmonary effort is normal.  Musculoskeletal:        General: Normal range of motion.  Skin:    General: Skin is warm.  Neurological:     Mental Status: He is alert.     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  I then debrided nailbeds 1-5 both feet no angiogenic bleeding will be seen back for routine care of this condition

## 2023-03-28 ENCOUNTER — Other Ambulatory Visit: Payer: Self-pay

## 2023-03-28 ENCOUNTER — Other Ambulatory Visit (HOSPITAL_COMMUNITY): Payer: Self-pay

## 2023-03-31 ENCOUNTER — Other Ambulatory Visit (HOSPITAL_COMMUNITY): Payer: Self-pay

## 2023-04-04 ENCOUNTER — Inpatient Hospital Stay: Payer: Medicare Other | Admitting: Oncology

## 2023-04-04 ENCOUNTER — Inpatient Hospital Stay: Payer: Medicare Other | Attending: Oncology

## 2023-04-04 VITALS — BP 140/76 | HR 87 | Temp 98.1°F | Resp 18 | Ht 65.0 in | Wt 145.4 lb

## 2023-04-04 DIAGNOSIS — C641 Malignant neoplasm of right kidney, except renal pelvis: Secondary | ICD-10-CM | POA: Diagnosis not present

## 2023-04-04 DIAGNOSIS — Z7901 Long term (current) use of anticoagulants: Secondary | ICD-10-CM | POA: Insufficient documentation

## 2023-04-04 DIAGNOSIS — Z7952 Long term (current) use of systemic steroids: Secondary | ICD-10-CM | POA: Diagnosis not present

## 2023-04-04 DIAGNOSIS — Z86711 Personal history of pulmonary embolism: Secondary | ICD-10-CM | POA: Insufficient documentation

## 2023-04-04 DIAGNOSIS — E119 Type 2 diabetes mellitus without complications: Secondary | ICD-10-CM | POA: Insufficient documentation

## 2023-04-04 DIAGNOSIS — C61 Malignant neoplasm of prostate: Secondary | ICD-10-CM | POA: Diagnosis not present

## 2023-04-04 DIAGNOSIS — Z79899 Other long term (current) drug therapy: Secondary | ICD-10-CM | POA: Insufficient documentation

## 2023-04-04 DIAGNOSIS — C7951 Secondary malignant neoplasm of bone: Secondary | ICD-10-CM | POA: Insufficient documentation

## 2023-04-04 LAB — CMP (CANCER CENTER ONLY)
ALT: 11 U/L (ref 0–44)
AST: 12 U/L — ABNORMAL LOW (ref 15–41)
Albumin: 4.5 g/dL (ref 3.5–5.0)
Alkaline Phosphatase: 52 U/L (ref 38–126)
Anion gap: 9 (ref 5–15)
BUN: 51 mg/dL — ABNORMAL HIGH (ref 8–23)
CO2: 25 mmol/L (ref 22–32)
Calcium: 10.2 mg/dL (ref 8.9–10.3)
Chloride: 108 mmol/L (ref 98–111)
Creatinine: 2.35 mg/dL — ABNORMAL HIGH (ref 0.61–1.24)
GFR, Estimated: 30 mL/min — ABNORMAL LOW (ref >60)
Glucose, Bld: 108 mg/dL — ABNORMAL HIGH (ref 70–99)
Potassium: 3.8 mmol/L (ref 3.5–5.1)
Sodium: 142 mmol/L (ref 135–145)
Total Bilirubin: 0.5 mg/dL (ref 0.3–1.2)
Total Protein: 7.1 g/dL (ref 6.5–8.1)

## 2023-04-04 LAB — CBC WITH DIFFERENTIAL (CANCER CENTER ONLY)
Abs Immature Granulocytes: 0.05 10*3/uL (ref 0.00–0.07)
Basophils Absolute: 0 10*3/uL (ref 0.0–0.1)
Basophils Relative: 0 %
Eosinophils Absolute: 0.1 10*3/uL (ref 0.0–0.5)
Eosinophils Relative: 1 %
HCT: 32.7 % — ABNORMAL LOW (ref 39.0–52.0)
Hemoglobin: 10 g/dL — ABNORMAL LOW (ref 13.0–17.0)
Immature Granulocytes: 1 %
Lymphocytes Relative: 13 %
Lymphs Abs: 1.1 10*3/uL (ref 0.7–4.0)
MCH: 29.9 pg (ref 26.0–34.0)
MCHC: 30.6 g/dL (ref 30.0–36.0)
MCV: 97.9 fL (ref 80.0–100.0)
Monocytes Absolute: 0.6 10*3/uL (ref 0.1–1.0)
Monocytes Relative: 8 %
Neutro Abs: 6.1 10*3/uL (ref 1.7–7.7)
Neutrophils Relative %: 77 %
Platelet Count: 200 10*3/uL (ref 150–400)
RBC: 3.34 MIL/uL — ABNORMAL LOW (ref 4.22–5.81)
RDW: 13.4 % (ref 11.5–15.5)
WBC Count: 8 10*3/uL (ref 4.0–10.5)
nRBC: 0 % (ref 0.0–0.2)

## 2023-04-04 NOTE — Progress Notes (Signed)
[name]   Diagnosis: Prostate cancer, renal cell carcinoma  INTERVAL HISTORY:   Brandon. Yerke returns as scheduled.  He feels well.  He continues abiraterone and prednisone.  No bleeding.  Good appetite.  No pain.  No complaint.  Objective:  Vital signs in last 24 hours:  Blood pressure (!) 140/76, pulse 87, temperature 98.1 F (36.7 C), temperature source Oral, resp. rate 18, height 5\' 5"  (1.651 m), weight 145 lb 6.4 oz (66 kg), SpO2 100 %.     Resp: Lungs clear bilaterally Cardio: Regular rate and rhythm GI: No hepatosplenomegaly, no mass, nontender Vascular: Trace pitting edema at the left greater than right lower leg and ankle    Lab Results:  Lab Results  Component Value Date   WBC 8.0 04/04/2023   HGB 10.0 (L) 04/04/2023   HCT 32.7 (L) 04/04/2023   MCV 97.9 04/04/2023   PLT 200 04/04/2023   NEUTROABS 6.1 04/04/2023    CMP  Lab Results  Component Value Date   NA 142 04/04/2023   K 3.8 04/04/2023   CL 108 04/04/2023   CO2 25 04/04/2023   GLUCOSE 108 (H) 04/04/2023   BUN 51 (H) 04/04/2023   CREATININE 2.35 (H) 04/04/2023   CALCIUM 10.2 04/04/2023   PROT 7.1 04/04/2023   ALBUMIN 4.5 04/04/2023   AST 12 (L) 04/04/2023   ALT 11 04/04/2023   ALKPHOS 52 04/04/2023   BILITOT 0.5 04/04/2023   GFRNONAA 30 (L) 04/04/2023   GFRAA >60 03/05/2020    Lab Results  Component Value Date   CEA1 3.31 05/03/2019    Medications: I have reviewed the patient's current medications.   Assessment/Plan: History of Severe anemia-status post a red cell transfusion 03/04/2016 2.. Prostate cancer Extensive sclerotic bone metastases noted on a CT of the abdomen/pelvis 03/04/2016 and MRI abdomen 03/05/2016 Markedly elevated PSA Lupron initiated 03/15/2016; Casodex 14 days beginning 03/15/2016 Prostate biopsy 03/18/2016 05/09/2016 PSA significantly improved at 21 Abiraterone initiated 05/10/2016 PSA less than 0.1 07/10/2018 PSA less  than 0.1 08/30/2018 PSA less than 0.1 10/18/2018 PSA less than 0.1 11/29/2018 PSA less than 0.1 on 11/06/2019  PSA less than 0.1 on 03/05/2020 PSA less than 0.1 on 07/06/2020 PSA less than 0.1 08/17/2021 PSA less than 0.1 on 09/30/2021 PSA 0.1 on 11/12/2021 PSA 0.1 on 02/18/2022 PSA 0.2 on 12/09/2022   3.   Symptoms of obstructive uropathy. Improved   4.   Distal duodenal and descending colon masses biopsy of the duodenal mass 03/04/2016 confirmed fragments of small bowel mucosa with focal high-grade dysplasia Biopsy of the descending colon "polyps" revealed fragments of a tubulovillous adenoma CT 08/29/2017-similar soft tissue thickening in the region of the splenic flexure of the colon suboptimally evaluated due to lack of colonic enteric contrast Referred to Dr. Elnoria Howard Referred to [facility] Resection of descending colon polyps 01/31/2018, tubulovillous adenomas with high-grade dysplasia Attempted endoscopic resection of the fourth segment duodenal "polyp" on 05/09/2018, firm unresectable lesion, biopsy revealed moderately differentiated adenocarcinoma CT abdomen/pelvis 05/25/2018- heterogeneously enhancing right lower pole renal lesion; diffuse sclerotic osseous metastatic lesions compatible with history of prostate cancer CT chest 05/25/2018- no pulmonary metastasis.  Diffuse osseous metastasis involving the axial and appendicular skeleton. 06/19/2018-sleeve resection of third and fourth portions of duodenum with primary end-to-end anastomosis, feeding gastrojejunostomy tube placement, Dr. Chauncey Mann; poorly differentiated adenocarcinoma duodenum, 3 cm; tumor invades the muscularis propria; lymphovascular invasion present; negative margins; 4 of 11 involved lymph nodes; soft tissue deposits also seen in the  mesentery; all mucosal margins and soft tissue margins negative but 2 of the positive nodes are found in the soft tissue margin submitted for frozen section (pT2 pN2); second mucosal lesion 1.2 cm from the proximal  margin, tubular adenoma with focal severe dysplasia Intact expression of mismatch repair proteins by IHC; microsatellite stable Cycle 1 adjuvant Xeloda 07/23/2018 Cycle 2 adjuvant Xeloda 08/13/2018 Cycle 3 adjuvant Xeloda 09/10/2018 (dose reduced to 1000 mg twice daily for 14 days)  Cycle 4 adjuvant Xeloda 10/01/2018 (dose increased to 1500 mg every morning and 1000 mg every afternoon for 14 days) Cycle 5 adjuvant Xeloda 10/22/2018 Cycle 6 adjuvant Xeloda 11/12/2018 Cycle 7 adjuvant Xeloda 12/03/2018 Cycle 8 adjuvant Xeloda 12/24/2018   5.  Anorexia/weight loss- resolved   6.  Undescended testicles   7.  Right renal mass.  CT 08/29/2017-right renal mass involving the lower pole was partially calcified and does demonstrate enhancement following contrast most consistent with renal cell carcinoma.  Lesion not significantly enlarged compared with the prior CT. Followed by urology. 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 to the IVC/RA junction, upper normal size retrocaval node 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, nuclear grade 3 Axitinib/pembrolizumab 10/22/2021 Axitinib placed on hold 11/05/2021 due to hand-foot syndrome Axitinib resumed at a dose of 5 mg daily 11/12/2021, pembrolizumab 11/12/2021 Axitinib discontinued 12/03/2021 secondary to progressive hand-foot syndrome, pembrolizumab 12/03/2021 Axitinib resumed at a reduced dose of 2 mg twice daily after office  visit 12/14/2021 Pembrolizumab 12/24/2021 Axitinib stopped 01/03/2022 secondary to persistent hand/foot symptoms CT chest at [facility] 01/13/2022-no evidence of thoracic metastatic disease, similar diffuse sclerotic change in the skeleton MRI abdomen at [facility] 01/13/2022-similar size of right lower pole renal mass with renal vein thrombosis, decreased overall extent of thrombus now extending to the intrahepatic IVC, enhancement of right renal mass and thrombus is decreased, new left hydroureter ureter nephrosis with asymmetric perinephric stranding Pembrolizumab 01/27/2022 Axitinib resumed 2 mg daily 01/28/2022 CT abdomen/pelvis at [facility] 02/10/2022-overall reduced size of right lower pole renal mass which demonstrates persistent region of enhancement along the posterior and lateral margin with extension into the renal sinus.  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  Query additional IVC thrombus at the intrahepatic IVC and extending into the right atrium. Axitinib continued 2 mg daily, Pembrolizumab every 3 weeks CT 05/05/2022 at [facility]-partially visualized pulmonary embolism involving segmental arteries of the right lower lobe.  Interval slight decrease in size of the mass arising from the lower pole the right kidney.  Mass demonstrates persistent moderate necrosis centrally.  Increased enhancing soft tissue component measuring up to 2 cm which is mildly increased in size compared to prior from 02/10/2022.  Bland tumor thrombus identified in the IVC at the level of the right renal vein ostium extending into the suprarenal IVC superiorly.  No obvious thrombus identified in the infrarenal, intrahepatic and suprahepatic IVC.  Likely trace bland thrombus in the anterior right renal vein.  Stable several subcentimeter retroperitoneal lymph nodes.  Diffuse osseous sclerotic and lytic metastatic disease stable compared to  prior. Axitinib placed on hold 05/04/2022 due to pain bilateral feet; 05/13/2022 he will  continue to hold axitinib Pembrolizumab 05/13/2022 Pembrolizumab 06/03/2022 Robotic right radical nephrectomy and IVC thrombectomy 06/27/2022-right kidney with tumor necrosis/hemorrhage,ypT0, adherent thrombus with cellular necrosis and no viable carcinoma present at the renal vein margin, other margins negative for carcinoma, 0/3 lymph nodes.  6.5 cm tumor including intraparenchymal lesion and thrombus in the renal vein 8.  Diabetes   9.  Anemia-stool Hemoccults + March 2021, referred to Dr. Elnoria Howard for endoscopic evaluation, anemia also secondary to metastatic prostate cancer, hypogonadism Small bowel enteroscopy 02/11/2020-esophagus normal.  Hematin found in the gastric body.  Examined duodenum normal.  No evidence of significant pathology in the entire examined portion of the jejunum.  There was no overt source of bleeding or inflammation.  The bleeding may be from small AVMs.  Surgical anastomosis was not visible with repeated inspection of the duodenum. Colonoscopy 02/11/2020-multiple sessile and semipedunculated polyps were found in the rectum, descending colon and ascending colon, 3 to 18 mm in size.  Polyps removed (polypectomy descending colon-fragments of tubular adenoma, inflammatory type polyp; polypectomy ascending colon-tubular adenoma, no high-grade dysplasia or malignancy; polypectomy descending colon-fragments of tubulovillous adenoma, no high-grade dysplasia or malignancy; polypectomy colon, rectum-tubular adenoma, no high-grade dysplasia or malignancy). Colonoscopy 02/02/2021-seven 2 to 10 mm polyps in the sigmoid colon (tubular adenoma), descending colon (fragments of tubulovillous adenoma, fragments of tubular adenoma ) and transverse colon (fragments of tubular adenoma). Progressive anemia 07/05/2021; stool cards positive Upper endoscopy/colonoscopy 09/23/2021-multiple polyps removed from the colon-tubular adenomas, diffuse angioectasias in the stomach with bleeding-not amenable to endoscopic  treatment 10.  Renal failure-progressive 01/17/2022, progressive following right nephrectomy October 2023 11.  CT chest at [facility] 01/13/2022-no evidence of thoracic metastatic disease, similar diffuse sclerotic changes in the skeleton 12.  CT renal stone study 01/14/2022-mild left side hydroureteronephrosis secondary to a left UVJ stone, left nephrolithiasis, new right middle lobe collapse/consolidation, edema/fluid at the left iliopsoas muscle 13.  PE noted on CT 05/05/2022-on apixaban.  He saw Dr. Isaiah Serge at [facility] 05/16/2022; instructions are to hold apixaban 72 hours prior to scheduled surgery and start Lovenox at prophylactic dosing.      Disposition: Brandon Peters appears stable.  He is in clinical remission from prostate cancer and renal cell carcinoma.  He is due for a leuprolide injection next month.  He will return for an office and lab visit in 2 months.  He continues abiraterone/prednisone. He is scheduled for follow-up with [facility] nephrology next month. Thornton Papas, MD  04/04/2023  10:51 AM

## 2023-04-05 LAB — PROSTATE-SPECIFIC AG, SERUM (LABCORP): Prostate Specific Ag, Serum: 0.3 ng/mL (ref 0.0–4.0)

## 2023-04-17 ENCOUNTER — Other Ambulatory Visit: Payer: Self-pay | Admitting: Oncology

## 2023-04-18 ENCOUNTER — Encounter: Payer: Self-pay | Admitting: Oncology

## 2023-04-24 ENCOUNTER — Other Ambulatory Visit: Payer: Self-pay

## 2023-04-26 IMAGING — CT CT BIOPSY CORE RENAL
1 of 3 series · 12 of 32 positions shown, 18 images · non-contrast
Comparison: MRI abdomen, 08/12/2021.

INDICATION: RIGHT renal mass

EXAM:
CT BIOPSY CORE RENAL
Procedure;
CT-GUIDED RIGHT RENAL MASS BIOPSY

[Series 2: i-spiral 5.0 b40f · axial · 0.92mm/px · z∈[+1032,+1217]mm · 12 of 63 slices shown, 18 images]
[im 5/63  soft-tissue]
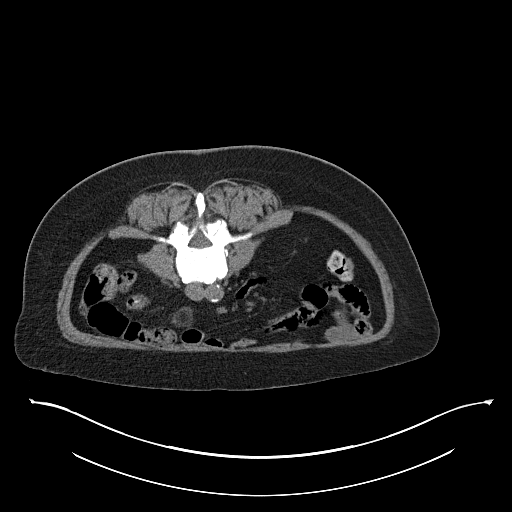
[im 5/63  bone]
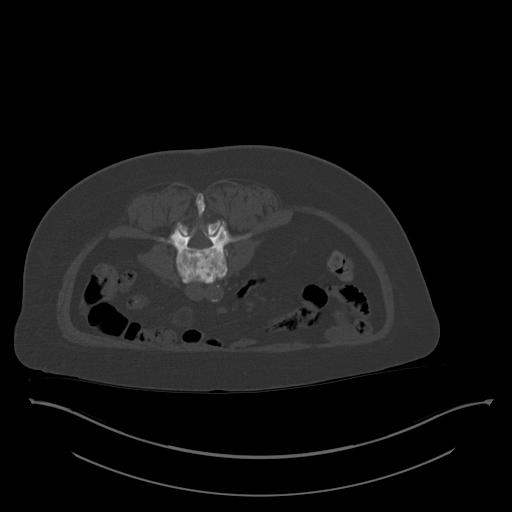
[im 10/63  soft-tissue]
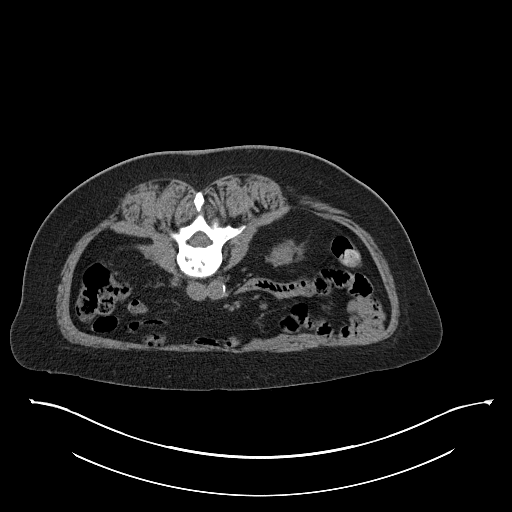
[im 15/63  soft-tissue]
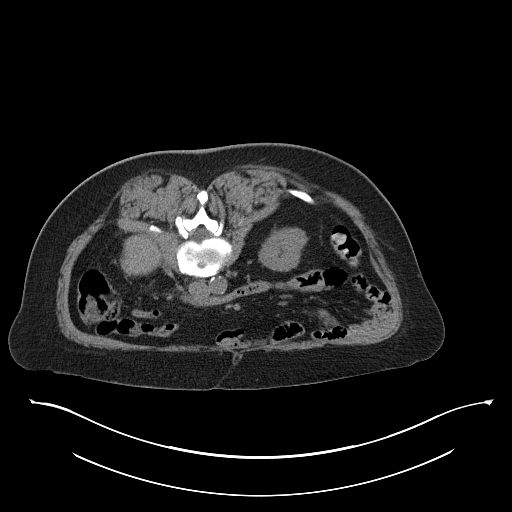
[im 20/63  soft-tissue]
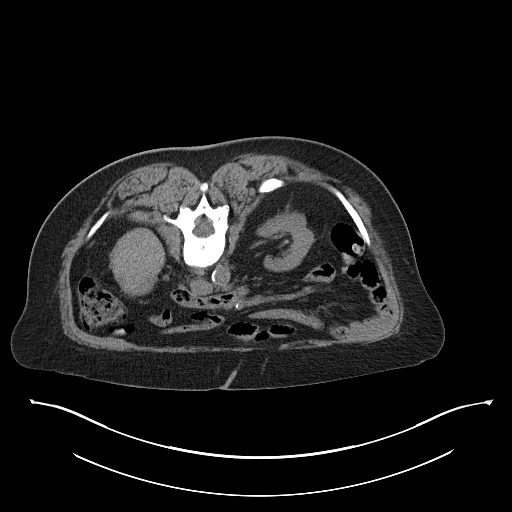
[im 24/63  soft-tissue]
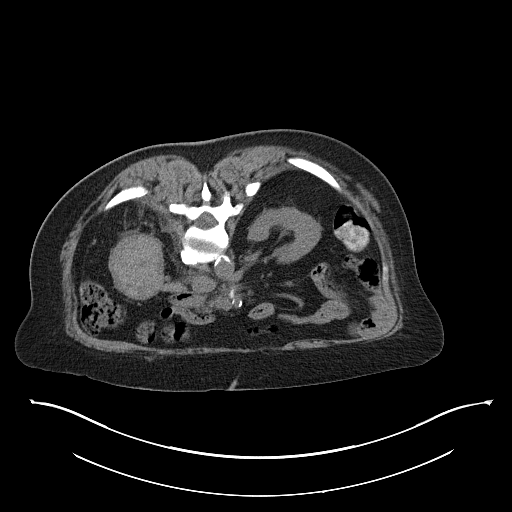
[im 29/63  soft-tissue]
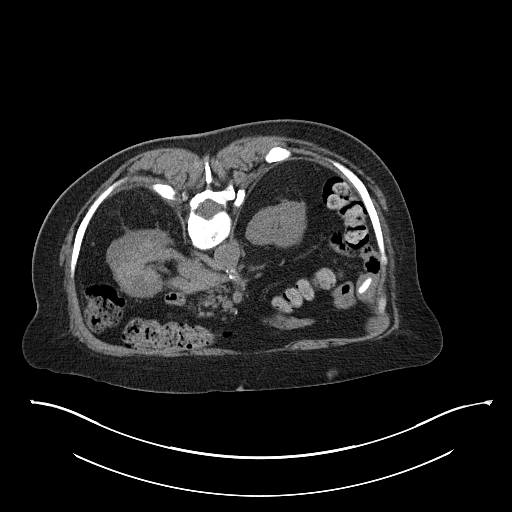
[im 34/63  soft-tissue]
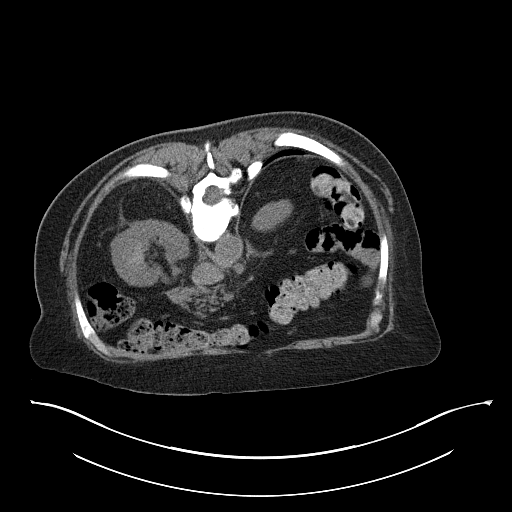
[im 39/63  soft-tissue]
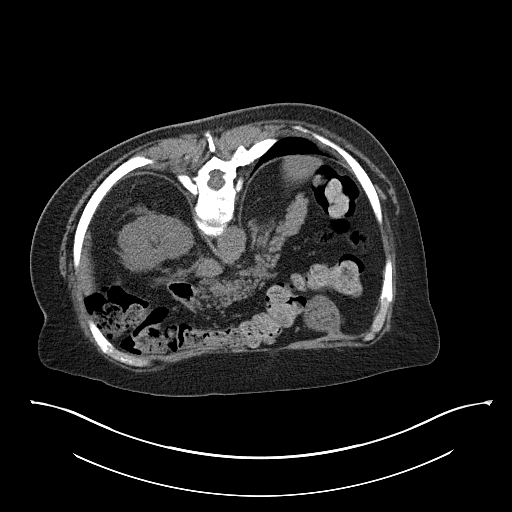
[im 43/63  soft-tissue]
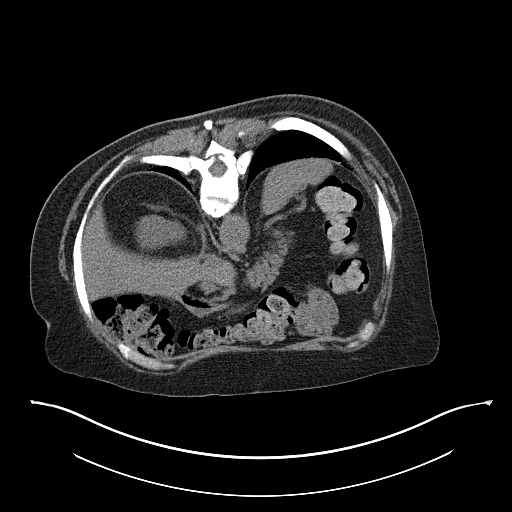
[im 43/63  lung]
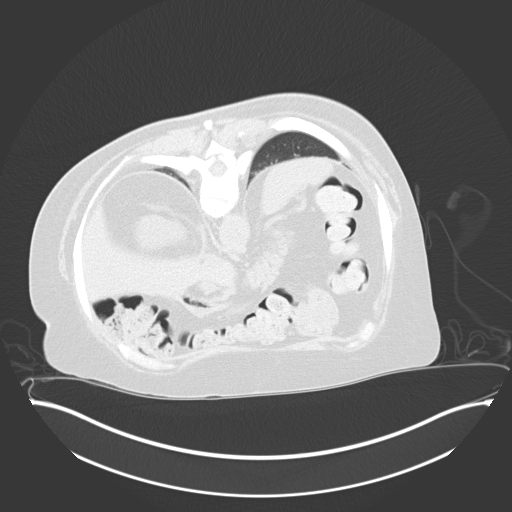
[im 43/63  bone]
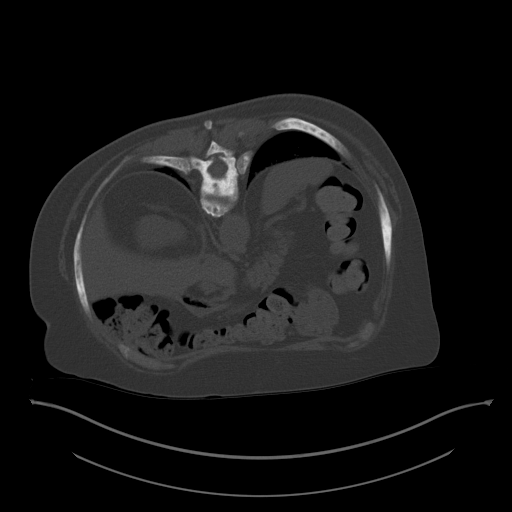
[im 48/63  soft-tissue]
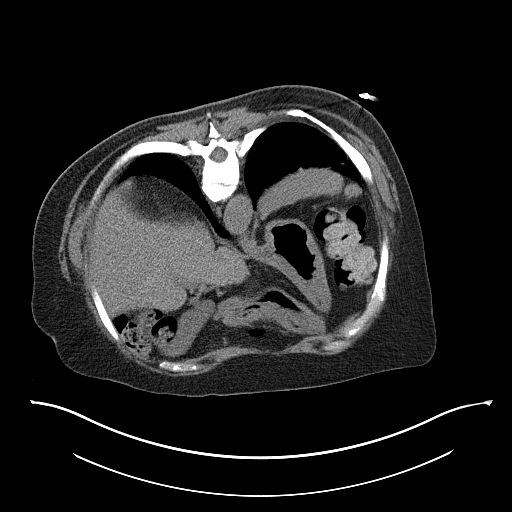
[im 48/63  lung]
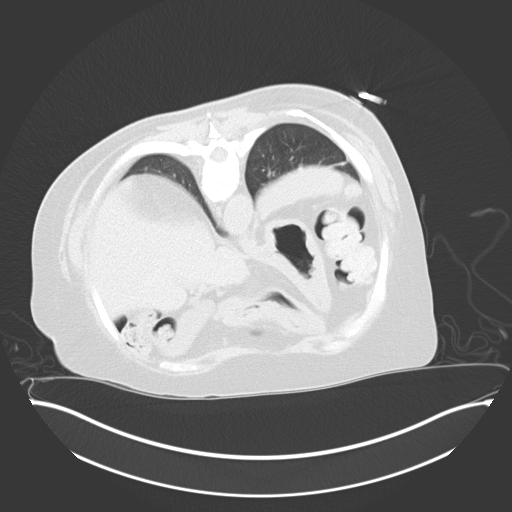
[im 53/63  soft-tissue]
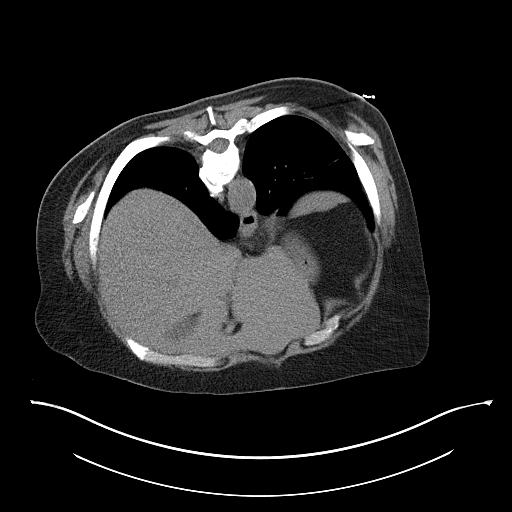
[im 53/63  lung]
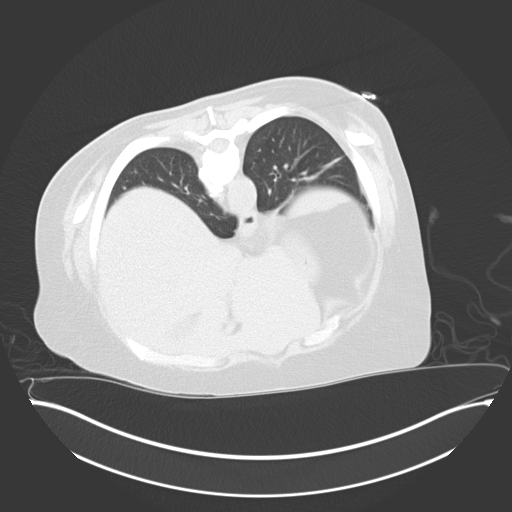
[im 58/63  soft-tissue]
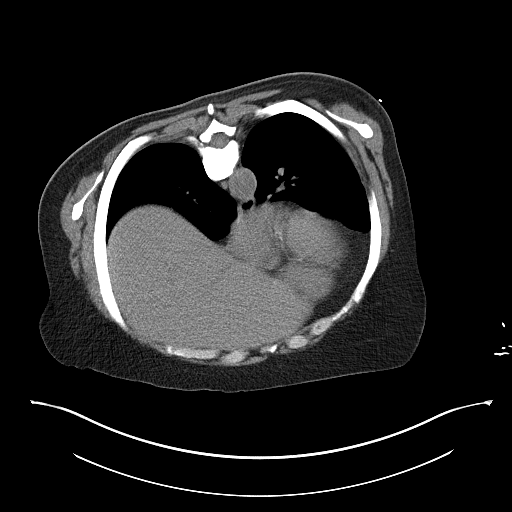
[im 58/63  lung]
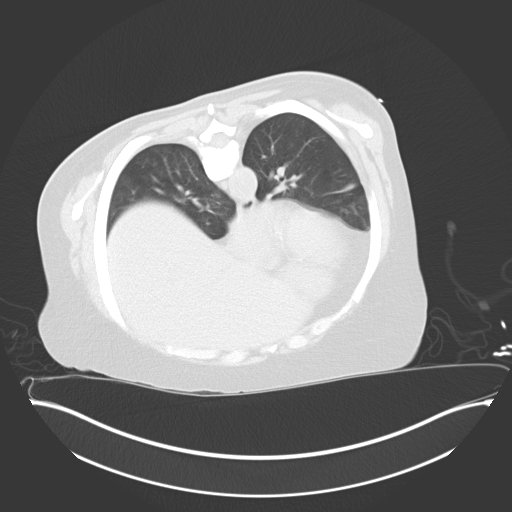

[12 of 32 positions shown; findings below may reference images not displayed]

US Abdomen, 07/06/2021. CT AP,
07/29/2021.

MEDICATIONS:
None.

ANESTHESIA/SEDATION:
Fentanyl 1.5 mcg IV; Versed 75 mg IV

Sedation time: 21 minutes; The patient was continuously monitored
during the procedure by the interventional radiology nurse under my
direct supervision.

CONTRAST:  None.

COMPLICATIONS:
None immediate.

PROCEDURE:
Informed consent was obtained from the patient following an
explanation of the procedure, risks, benefits and alternatives. A
time out was performed prior to the initiation of the procedure.

The patient was positioned prone on the CT table and a limited CT
was performed for procedural planning demonstrating RIGHT inferior
pole renal mass. The procedure was planned. The operative site was
prepped and draped in the usual sterile fashion. Appropriate
trajectory was confirmed with a 22 gauge spinal needle after the
adjacent tissues were anesthetized with 1% Lidocaine with
epinephrine.

Under intermittent CT guidance, a 17 gauge coaxial needle was
advanced into the peripheral aspect of the mass.

Appropriate positioning was confirmed and 3 samples were obtained
with an 18 gauge core needle biopsy device. The co-axial needle was
removed and hemostasis was achieved with Gelfoam slurry tract
injection.

A limited postprocedural CT was negative for hemorrhage or
additional complication. A dressing was placed. The patient
tolerated the procedure well without immediate postprocedural
complication.
IMPRESSION: Successful CT-guided core needle biopsy of RIGHT renal mass.

## 2023-05-02 ENCOUNTER — Other Ambulatory Visit: Payer: Self-pay

## 2023-05-05 ENCOUNTER — Inpatient Hospital Stay: Payer: Medicare Other

## 2023-05-09 ENCOUNTER — Inpatient Hospital Stay: Payer: Medicare Other | Attending: Oncology

## 2023-05-09 VITALS — BP 135/68 | HR 72 | Temp 98.2°F | Resp 18 | Ht 65.0 in | Wt 148.0 lb

## 2023-05-09 DIAGNOSIS — Z79899 Other long term (current) drug therapy: Secondary | ICD-10-CM | POA: Diagnosis not present

## 2023-05-09 DIAGNOSIS — Z5111 Encounter for antineoplastic chemotherapy: Secondary | ICD-10-CM | POA: Diagnosis not present

## 2023-05-09 DIAGNOSIS — C61 Malignant neoplasm of prostate: Secondary | ICD-10-CM | POA: Insufficient documentation

## 2023-05-09 DIAGNOSIS — C7951 Secondary malignant neoplasm of bone: Secondary | ICD-10-CM | POA: Diagnosis present

## 2023-05-09 MED ORDER — LEUPROLIDE ACETATE (4 MONTH) 30 MG ~~LOC~~ KIT
30.0000 mg | PACK | Freq: Once | SUBCUTANEOUS | Status: AC
  Administered 2023-05-09: 30 mg via SUBCUTANEOUS
  Filled 2023-05-09: qty 30

## 2023-05-09 NOTE — Patient Instructions (Signed)

## 2023-05-25 ENCOUNTER — Other Ambulatory Visit: Payer: Self-pay | Admitting: Oncology

## 2023-05-25 ENCOUNTER — Other Ambulatory Visit (HOSPITAL_COMMUNITY): Payer: Self-pay

## 2023-05-25 ENCOUNTER — Other Ambulatory Visit: Payer: Self-pay

## 2023-05-25 DIAGNOSIS — C61 Malignant neoplasm of prostate: Secondary | ICD-10-CM

## 2023-05-25 MED ORDER — ABIRATERONE ACETATE 250 MG PO TABS
ORAL_TABLET | ORAL | 5 refills | Status: DC
Start: 2023-05-25 — End: 2023-11-23
  Filled 2023-05-25: qty 120, 30d supply, fill #0
  Filled 2023-06-28: qty 120, 30d supply, fill #1
  Filled 2023-07-26: qty 120, 30d supply, fill #2
  Filled 2023-08-28: qty 120, 30d supply, fill #3
  Filled 2023-09-26: qty 120, 30d supply, fill #4
  Filled 2023-10-23: qty 120, 30d supply, fill #5

## 2023-06-02 ENCOUNTER — Encounter: Payer: Self-pay | Admitting: Podiatry

## 2023-06-02 ENCOUNTER — Ambulatory Visit: Payer: Medicare Other | Admitting: Podiatry

## 2023-06-02 DIAGNOSIS — M79674 Pain in right toe(s): Secondary | ICD-10-CM | POA: Diagnosis not present

## 2023-06-02 DIAGNOSIS — D689 Coagulation defect, unspecified: Secondary | ICD-10-CM

## 2023-06-02 DIAGNOSIS — L84 Corns and callosities: Secondary | ICD-10-CM | POA: Diagnosis not present

## 2023-06-02 DIAGNOSIS — M79675 Pain in left toe(s): Secondary | ICD-10-CM | POA: Diagnosis not present

## 2023-06-02 DIAGNOSIS — B351 Tinea unguium: Secondary | ICD-10-CM | POA: Diagnosis not present

## 2023-06-04 NOTE — Progress Notes (Signed)
Subjective:   Patient ID: Brandon Peters, male   DOB: 67 y.o.   MRN: [id number]   HPI Patient presents with very bad calluses bilateral that are very thin with patient on blood thinner and elongated nails he cannot take care of   ROS      Objective:  Physical Exam  Neurovascular status unchanged with thick brittle nailbeds 1-5 both feet and lesions of the second and fifth metatarsals bilateral painful     Assessment:  High risk patient with patient on Xarelto with chronic lesion formation and nail disease     Plan:  Debridement of nails and lesions the ones on the second metatarsal was so thin as far as skin they did bleed I applied sterile dressings with Neosporin and instructed on soaks and let us know if any issues were to occur and reappoint approximate 3 months for routine care

## 2023-06-06 ENCOUNTER — Inpatient Hospital Stay: Payer: Medicare Other | Attending: Oncology

## 2023-06-06 ENCOUNTER — Inpatient Hospital Stay: Payer: Medicare Other | Admitting: Oncology

## 2023-06-06 VITALS — BP 125/68 | HR 64 | Temp 98.1°F | Resp 18 | Ht 65.0 in | Wt 151.9 lb

## 2023-06-06 DIAGNOSIS — C7951 Secondary malignant neoplasm of bone: Secondary | ICD-10-CM | POA: Diagnosis present

## 2023-06-06 DIAGNOSIS — C61 Malignant neoplasm of prostate: Secondary | ICD-10-CM | POA: Insufficient documentation

## 2023-06-06 DIAGNOSIS — D649 Anemia, unspecified: Secondary | ICD-10-CM | POA: Diagnosis not present

## 2023-06-06 LAB — CBC WITH DIFFERENTIAL (CANCER CENTER ONLY)
Abs Immature Granulocytes: 0.02 10*3/uL (ref 0.00–0.07)
Basophils Absolute: 0 10*3/uL (ref 0.0–0.1)
Basophils Relative: 0 %
Eosinophils Absolute: 0.1 10*3/uL (ref 0.0–0.5)
Eosinophils Relative: 2 %
HCT: 29.3 % — ABNORMAL LOW (ref 39.0–52.0)
Hemoglobin: 8.7 g/dL — ABNORMAL LOW (ref 13.0–17.0)
Immature Granulocytes: 0 %
Lymphocytes Relative: 18 %
Lymphs Abs: 1.2 10*3/uL (ref 0.7–4.0)
MCH: 30 pg (ref 26.0–34.0)
MCHC: 29.7 g/dL — ABNORMAL LOW (ref 30.0–36.0)
MCV: 101 fL — ABNORMAL HIGH (ref 80.0–100.0)
Monocytes Absolute: 0.6 10*3/uL (ref 0.1–1.0)
Monocytes Relative: 9 %
Neutro Abs: 4.8 10*3/uL (ref 1.7–7.7)
Neutrophils Relative %: 71 %
Platelet Count: 189 10*3/uL (ref 150–400)
RBC: 2.9 MIL/uL — ABNORMAL LOW (ref 4.22–5.81)
RDW: 14.6 % (ref 11.5–15.5)
WBC Count: 6.7 10*3/uL (ref 4.0–10.5)
nRBC: 0 % (ref 0.0–0.2)

## 2023-06-06 LAB — CMP (CANCER CENTER ONLY)
ALT: 12 U/L (ref 0–44)
AST: 12 U/L — ABNORMAL LOW (ref 15–41)
Albumin: 3.9 g/dL (ref 3.5–5.0)
Alkaline Phosphatase: 45 U/L (ref 38–126)
Anion gap: 9 (ref 5–15)
BUN: 51 mg/dL — ABNORMAL HIGH (ref 8–23)
CO2: 22 mmol/L (ref 22–32)
Calcium: 9 mg/dL (ref 8.9–10.3)
Chloride: 110 mmol/L (ref 98–111)
Creatinine: 2.51 mg/dL — ABNORMAL HIGH (ref 0.61–1.24)
GFR, Estimated: 28 mL/min — ABNORMAL LOW (ref >60)
Glucose, Bld: 103 mg/dL — ABNORMAL HIGH (ref 70–99)
Potassium: 4 mmol/L (ref 3.5–5.1)
Sodium: 141 mmol/L (ref 135–145)
Total Bilirubin: 0.5 mg/dL (ref 0.3–1.2)
Total Protein: 6.7 g/dL (ref 6.5–8.1)

## 2023-06-06 NOTE — Progress Notes (Signed)
Brandon Peters   Diagnosis: Prostate cancer, renal cell carcinoma  INTERVAL HISTORY:   Brandon Peters returns as scheduled.  He continues abiraterone.  He received leuprolide last month.  He feels well.  Good appetite.  No dyspnea.  He is followed by [facility] nephrology in [place].  He continues to have leg edema.  Objective:  Vital signs in last 24 hours:  Blood pressure 125/68, pulse 64, temperature 98.1 F (36.7 C), temperature source Oral, resp. rate 18, height 5\' 5"  (1.651 m), weight 151 lb 14.4 oz (68.9 kg), SpO2 100%.    Resp: Decreased breath sounds at the right posterior base, no respiratory distress Cardio: Regular rate and rhythm GI: Nontender, no mass, no hepatosplenomegaly Vascular: 1+ pitting edema at the lower leg and foot on the left greater than right Musculoskeletal: Bilateral gynecomastia    Lab Results:  Lab Results  Component Value Date   WBC 6.7 06/06/2023   HGB 8.7 (L) 06/06/2023   HCT 29.3 (L) 06/06/2023   MCV 101.0 (H) 06/06/2023   PLT 189 06/06/2023   NEUTROABS 4.8 06/06/2023    CMP  Lab Results  Component Value Date   NA 141 06/06/2023   K 4.0 06/06/2023   CL 110 06/06/2023   CO2 22 06/06/2023   GLUCOSE 103 (H) 06/06/2023   BUN 51 (H) 06/06/2023   CREATININE 2.51 (H) 06/06/2023   CALCIUM 9.0 06/06/2023   PROT 6.7 06/06/2023   ALBUMIN 3.9 06/06/2023   AST 12 (L) 06/06/2023   ALT 12 06/06/2023   ALKPHOS 45 06/06/2023   BILITOT 0.5 06/06/2023   GFRNONAA 28 (L) 06/06/2023   GFRAA >60 03/05/2020     Medications: I have reviewed the patient's current medications.   Assessment/Plan:  History of Severe anemia-status post a red cell transfusion 03/04/2016 2.. Prostate cancer Extensive sclerotic bone metastases noted on a CT of the abdomen/pelvis 03/04/2016 and MRI abdomen 03/05/2016 Markedly elevated PSA Lupron initiated 03/15/2016; Casodex 14 days beginning 03/15/2016 Prostate biopsy  03/18/2016 05/09/2016 PSA significantly improved at 21 Abiraterone initiated 05/10/2016 PSA less than 0.1 07/10/2018 PSA less than 0.1 08/30/2018 PSA less than 0.1 10/18/2018 PSA less than 0.1 11/29/2018 PSA less than 0.1 on 11/06/2019  PSA less than 0.1 on 03/05/2020 PSA less than 0.1 on 07/06/2020 PSA less than 0.1 08/17/2021 PSA less than 0.1 on 09/30/2021 PSA 0.1 on 11/12/2021 PSA 0.1 on 02/18/2022 PSA 0.2 on 12/09/2022   3.   Symptoms of obstructive uropathy. Improved   4.   Distal duodenal and descending colon masses biopsy of the duodenal mass 03/04/2016 confirmed fragments of small bowel mucosa with focal high-grade dysplasia Biopsy of the descending colon "polyps" revealed fragments of a tubulovillous adenoma CT 08/29/2017-similar soft tissue thickening in the region of the splenic flexure of the colon suboptimally evaluated due to lack of colonic enteric contrast Referred to Dr. Elnoria Howard Referred to [facility] Resection of descending colon polyps 01/31/2018, tubulovillous adenomas with high-grade dysplasia Attempted endoscopic resection of the fourth segment duodenal "polyp" on 05/09/2018, firm unresectable lesion, biopsy revealed moderately differentiated adenocarcinoma CT abdomen/pelvis 05/25/2018- heterogeneously enhancing right lower pole renal lesion; diffuse sclerotic osseous metastatic lesions compatible with history of prostate cancer CT chest 05/25/2018- no pulmonary metastasis.  Diffuse osseous metastasis involving the axial and appendicular skeleton. 06/19/2018-sleeve resection of third and fourth portions of duodenum with primary end-to-end anastomosis, feeding gastrojejunostomy tube placement, Dr. Chauncey Mann; poorly differentiated adenocarcinoma duodenum, 3 cm; tumor invades the muscularis propria; lymphovascular invasion present; negative margins;  4 of 11 involved lymph nodes; soft tissue deposits also seen in the mesentery; all mucosal margins and soft tissue margins negative but 2 of the positive  nodes are found in the soft tissue margin submitted for frozen section (pT2 pN2); second mucosal lesion 1.2 cm from the proximal margin, tubular adenoma with focal severe dysplasia Intact expression of mismatch repair proteins by IHC; microsatellite stable Cycle 1 adjuvant Xeloda 07/23/2018 Cycle 2 adjuvant Xeloda 08/13/2018 Cycle 3 adjuvant Xeloda 09/10/2018 (dose reduced to 1000 mg twice daily for 14 days)  Cycle 4 adjuvant Xeloda 10/01/2018 (dose increased to 1500 mg every morning and 1000 mg every afternoon for 14 days) Cycle 5 adjuvant Xeloda 10/22/2018 Cycle 6 adjuvant Xeloda 11/12/2018 Cycle 7 adjuvant Xeloda 12/03/2018 Cycle 8 adjuvant Xeloda 12/24/2018   5.  Anorexia/weight loss- resolved   6.  Undescended testicles   7.  Right renal mass.  CT 08/29/2017-right renal mass involving the lower pole was partially calcified and does demonstrate enhancement following contrast most consistent with renal cell carcinoma.  Lesion not significantly enlarged compared with the prior CT. Followed by urology. 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 to the IVC/RA junction, upper normal size retrocaval node 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, nuclear grade 3 Axitinib/pembrolizumab 10/22/2021 Axitinib placed on hold 11/05/2021 due to hand-foot syndrome Axitinib resumed at a dose of 5 mg daily 11/12/2021, pembrolizumab 11/12/2021 Axitinib discontinued 12/03/2021 secondary  to progressive hand-foot syndrome, pembrolizumab 12/03/2021 Axitinib resumed at a reduced dose of 2 mg twice daily after office visit 12/14/2021 Pembrolizumab 12/24/2021 Axitinib stopped 01/03/2022 secondary to persistent hand/foot symptoms CT chest at [facility] 01/13/2022-no evidence of thoracic metastatic disease, similar diffuse sclerotic change in the skeleton MRI abdomen at [facility] 01/13/2022-similar size of right lower pole renal mass with renal vein thrombosis, decreased overall extent of thrombus now extending to the intrahepatic IVC, enhancement of right renal mass and thrombus is decreased, new left hydroureter ureter nephrosis with asymmetric perinephric stranding Pembrolizumab 01/27/2022 Axitinib resumed 2 mg daily 01/28/2022 CT abdomen/pelvis at [facility] 02/10/2022-overall reduced size of right lower pole renal mass which demonstrates persistent region of enhancement along the posterior and lateral margin with extension into the renal sinus.  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  Query additional IVC thrombus at the intrahepatic IVC and extending into the right atrium. Axitinib continued 2 mg daily, Pembrolizumab every 3 weeks CT 05/05/2022 at [facility]-partially visualized pulmonary embolism involving segmental arteries of the right lower lobe.  Interval slight decrease in size of the mass arising from the lower pole the right kidney.  Mass demonstrates persistent moderate necrosis centrally.  Increased enhancing soft tissue component measuring up to 2 cm which is mildly increased in size compared to prior from 02/10/2022.  Bland tumor thrombus identified in the IVC at the level of the right renal vein ostium extending into the suprarenal IVC superiorly.  No obvious thrombus identified in the infrarenal, intrahepatic and suprahepatic IVC.  Likely trace bland thrombus in the anterior right renal vein.  Stable several subcentimeter retroperitoneal  lymph nodes.  Diffuse osseous sclerotic and  lytic metastatic disease stable compared to prior. Axitinib placed on hold 05/04/2022 due to pain bilateral feet; 05/13/2022 he will continue to hold axitinib Pembrolizumab 05/13/2022 Pembrolizumab 06/03/2022 Robotic right radical nephrectomy and IVC thrombectomy 06/27/2022-right kidney with tumor necrosis/hemorrhage,ypT0, adherent thrombus with cellular necrosis and no viable carcinoma present at the renal vein margin, other margins negative for carcinoma, 0/3 lymph nodes.  6.5 cm tumor including intraparenchymal lesion and thrombus in the renal vein 8.  Diabetes   9.  Anemia-stool Hemoccults + March 2021, referred to Dr. Elnoria Howard for endoscopic evaluation, anemia also secondary to metastatic prostate cancer, hypogonadism Small bowel enteroscopy 02/11/2020-esophagus normal.  Hematin found in the gastric body.  Examined duodenum normal.  No evidence of significant pathology in the entire examined portion of the jejunum.  There was no overt source of bleeding or inflammation.  The bleeding may be from small AVMs.  Surgical anastomosis was not visible with repeated inspection of the duodenum. Colonoscopy 02/11/2020-multiple sessile and semipedunculated polyps were found in the rectum, descending colon and ascending colon, 3 to 18 mm in size.  Polyps removed (polypectomy descending colon-fragments of tubular adenoma, inflammatory type polyp; polypectomy ascending colon-tubular adenoma, no high-grade dysplasia or malignancy; polypectomy descending colon-fragments of tubulovillous adenoma, no high-grade dysplasia or malignancy; polypectomy colon, rectum-tubular adenoma, no high-grade dysplasia or malignancy). Colonoscopy 02/02/2021-seven 2 to 10 mm polyps in the sigmoid colon (tubular adenoma), descending colon (fragments of tubulovillous adenoma, fragments of tubular adenoma ) and transverse colon (fragments of tubular adenoma). Progressive anemia 07/05/2021; stool cards positive Upper endoscopy/colonoscopy  09/23/2021-multiple polyps removed from the colon-tubular adenomas, diffuse angioectasias in the stomach with bleeding-not amenable to endoscopic treatment 10.  Renal failure-progressive 01/17/2022, progressive following right nephrectomy October 2023 11.  CT chest at [facility] 01/13/2022-no evidence of thoracic metastatic disease, similar diffuse sclerotic changes in the skeleton 12.  CT renal stone study 01/14/2022-mild left side hydroureteronephrosis secondary to a left UVJ stone, left nephrolithiasis, new right middle lobe collapse/consolidation, edema/fluid at the left iliopsoas muscle 13.  PE noted on CT 05/05/2022-on apixaban.  He saw Dr. Isaiah Serge at [facility] 05/16/2022; instructions are to hold apixaban 72 hours prior to scheduled surgery and start Lovenox at prophylactic dosing.       Disposition: Mr Violet appears unchanged.  The hemoglobin is lower today.  The anemia is secondary to chronic disease, renal insufficiency, and potentially progression of prostate cancer.  The PSA has been slightly higher over the past several months.  We will follow-up on the PSA from today.  He does not appear symptomatic from anemia at present.  He will call for symptoms of anemia.  He will continue abiraterone.  We could consider a trial of erythropoietin therapy if the anemia progresses.  I discussed the risks of erythropoietin therapy in patients with an active malignancy.  Mr. Moose has leg edema.  This is likely in part due to renal failure and potentially complicated by amlodipine.  He will follow-up with nephrology for management of the leg edema.  He will return for an office and lab visit in 1 month.  Thornton Papas, MD  06/06/2023  10:43 AM

## 2023-06-07 ENCOUNTER — Encounter: Payer: Self-pay | Admitting: *Deleted

## 2023-06-07 LAB — PROSTATE-SPECIFIC AG, SERUM (LABCORP): Prostate Specific Ag, Serum: 0.4 ng/mL (ref 0.0–4.0)

## 2023-06-08 ENCOUNTER — Other Ambulatory Visit: Payer: Self-pay | Admitting: Oncology

## 2023-06-27 ENCOUNTER — Other Ambulatory Visit: Payer: Self-pay

## 2023-06-28 ENCOUNTER — Other Ambulatory Visit: Payer: Self-pay

## 2023-06-28 NOTE — Progress Notes (Signed)
Specialty Pharmacy Refill Coordination Note  Brandon Peters is a 67 y.o. male contacted today regarding refills of specialty medication(s) Abiraterone Acetate   Patient requested [name] at [facility] at [place] date: 07/05/23   Medication will be filled on 07/04/23.

## 2023-07-04 ENCOUNTER — Other Ambulatory Visit: Payer: Self-pay

## 2023-07-05 ENCOUNTER — Inpatient Hospital Stay: Payer: Medicare Other | Admitting: Nurse Practitioner

## 2023-07-05 ENCOUNTER — Encounter: Payer: Self-pay | Admitting: Nurse Practitioner

## 2023-07-05 ENCOUNTER — Inpatient Hospital Stay: Payer: Medicare Other | Attending: Oncology

## 2023-07-05 VITALS — BP 122/68 | HR 81 | Temp 97.9°F | Resp 18 | Ht 65.0 in | Wt 153.5 lb

## 2023-07-05 DIAGNOSIS — Z79899 Other long term (current) drug therapy: Secondary | ICD-10-CM | POA: Insufficient documentation

## 2023-07-05 DIAGNOSIS — C641 Malignant neoplasm of right kidney, except renal pelvis: Secondary | ICD-10-CM

## 2023-07-05 DIAGNOSIS — E119 Type 2 diabetes mellitus without complications: Secondary | ICD-10-CM | POA: Insufficient documentation

## 2023-07-05 DIAGNOSIS — C7951 Secondary malignant neoplasm of bone: Secondary | ICD-10-CM | POA: Diagnosis not present

## 2023-07-05 DIAGNOSIS — D649 Anemia, unspecified: Secondary | ICD-10-CM | POA: Diagnosis not present

## 2023-07-05 DIAGNOSIS — C179 Malignant neoplasm of small intestine, unspecified: Secondary | ICD-10-CM

## 2023-07-05 DIAGNOSIS — C61 Malignant neoplasm of prostate: Secondary | ICD-10-CM | POA: Diagnosis not present

## 2023-07-05 DIAGNOSIS — Z905 Acquired absence of kidney: Secondary | ICD-10-CM | POA: Insufficient documentation

## 2023-07-05 DIAGNOSIS — Q539 Undescended testicle, unspecified: Secondary | ICD-10-CM | POA: Diagnosis not present

## 2023-07-05 LAB — SAMPLE TO BLOOD BANK

## 2023-07-05 LAB — CBC WITH DIFFERENTIAL (CANCER CENTER ONLY)
Abs Immature Granulocytes: 0.02 10*3/uL (ref 0.00–0.07)
Basophils Absolute: 0 10*3/uL (ref 0.0–0.1)
Basophils Relative: 0 %
Eosinophils Absolute: 0 10*3/uL (ref 0.0–0.5)
Eosinophils Relative: 1 %
HCT: 28.5 % — ABNORMAL LOW (ref 39.0–52.0)
Hemoglobin: 8.6 g/dL — ABNORMAL LOW (ref 13.0–17.0)
Immature Granulocytes: 0 %
Lymphocytes Relative: 7 %
Lymphs Abs: 0.5 10*3/uL — ABNORMAL LOW (ref 0.7–4.0)
MCH: 29.7 pg (ref 26.0–34.0)
MCHC: 30.2 g/dL (ref 30.0–36.0)
MCV: 98.3 fL (ref 80.0–100.0)
Monocytes Absolute: 0.3 10*3/uL (ref 0.1–1.0)
Monocytes Relative: 4 %
Neutro Abs: 5.8 10*3/uL (ref 1.7–7.7)
Neutrophils Relative %: 88 %
Platelet Count: 173 10*3/uL (ref 150–400)
RBC: 2.9 MIL/uL — ABNORMAL LOW (ref 4.22–5.81)
RDW: 13.8 % (ref 11.5–15.5)
WBC Count: 6.6 10*3/uL (ref 4.0–10.5)
nRBC: 0 % (ref 0.0–0.2)

## 2023-07-05 LAB — CMP (CANCER CENTER ONLY)
ALT: 14 U/L (ref 0–44)
AST: 15 U/L (ref 15–41)
Albumin: 3.9 g/dL (ref 3.5–5.0)
Alkaline Phosphatase: 48 U/L (ref 38–126)
Anion gap: 10 (ref 5–15)
BUN: 45 mg/dL — ABNORMAL HIGH (ref 8–23)
CO2: 21 mmol/L — ABNORMAL LOW (ref 22–32)
Calcium: 9 mg/dL (ref 8.9–10.3)
Chloride: 110 mmol/L (ref 98–111)
Creatinine: 2.43 mg/dL — ABNORMAL HIGH (ref 0.61–1.24)
GFR, Estimated: 29 mL/min — ABNORMAL LOW (ref >60)
Glucose, Bld: 170 mg/dL — ABNORMAL HIGH (ref 70–99)
Potassium: 4.3 mmol/L (ref 3.5–5.1)
Sodium: 141 mmol/L (ref 135–145)
Total Bilirubin: 0.4 mg/dL (ref 0.3–1.2)
Total Protein: 6.4 g/dL — ABNORMAL LOW (ref 6.5–8.1)

## 2023-07-05 LAB — RETICULOCYTES
Immature Retic Fract: 17.5 % — ABNORMAL HIGH (ref 2.3–15.9)
RBC.: 2.89 MIL/uL — ABNORMAL LOW (ref 4.22–5.81)
Retic Count, Absolute: 73.7 10*3/uL (ref 19.0–186.0)
Retic Ct Pct: 2.6 % (ref 0.4–3.1)

## 2023-07-05 LAB — IRON AND TIBC
Iron: 79 ug/dL (ref 45–182)
Saturation Ratios: 26 % (ref 17.9–39.5)
TIBC: 305 ug/dL (ref 250–450)
UIBC: 226 ug/dL

## 2023-07-05 LAB — FERRITIN: Ferritin: 56 ng/mL (ref 24–336)

## 2023-07-05 NOTE — Progress Notes (Signed)
[facility] OFFICE PROGRESS NOTE   Diagnosis: Prostate cancer, renal cell carcinoma  INTERVAL HISTORY:   Mr. Brandon returns as scheduled.  Overall he is feeling well.  No bleeding that he is aware of.  He continues oral iron.  No nausea or constipation.  He has a good appetite.  He denies pain.  Occasional hot flash.  Stable leg edema.  Objective:  Vital signs in last 24 hours:  Blood pressure 122/68, pulse 81, temperature 97.9 F (36.6 C), temperature source Temporal, resp. rate 18, height 5\' 5"  (1.651 m), weight 153 lb 8 oz (69.6 kg), SpO2 100%.    HEENT: No thrush or ulcers. Lymphatics: No palpable cervical, supraclavicular, axillary lymph nodes. Resp: Diminished breath sounds right lung base.  No respiratory distress. Cardio: Regular rate and rhythm. GI: No hepatosplenomegaly.  No mass. Vascular: Pitting edema at the lower leg bilaterally right slightly greater than left.   Lab Results:  Lab Results  Component Value Date   WBC 6.6 07/05/2023   HGB 8.6 (L) 07/05/2023   HCT 28.5 (L) 07/05/2023   MCV 98.3 07/05/2023   PLT 173 07/05/2023   NEUTROABS 5.8 07/05/2023    Imaging:  No results found.  Medications: I have reviewed the patient's current medications.  Assessment/Plan: History of Severe anemia-status post a red cell transfusion 03/04/2016 2.. Prostate cancer Extensive sclerotic bone metastases noted on a CT of the abdomen/pelvis 03/04/2016 and MRI abdomen 03/05/2016 Markedly elevated PSA Lupron initiated 03/15/2016; Casodex 14 days beginning 03/15/2016 Prostate biopsy 03/18/2016 05/09/2016 PSA significantly improved at 21 Abiraterone initiated 05/10/2016 PSA less than 0.1 07/10/2018 PSA less than 0.1 08/30/2018 PSA less than 0.1 10/18/2018 PSA less than 0.1 11/29/2018 PSA less than 0.1 on 11/06/2019  PSA less than 0.1 on 03/05/2020 PSA less than 0.1 on 07/06/2020 PSA less than 0.1 08/17/2021 PSA less than 0.1 on 09/30/2021 PSA 0.1 on  11/12/2021 PSA 0.1 on 02/18/2022 PSA 0.2 on 12/09/2022 PSA 0.3 on 04/04/2023  PSA 0.4 on 06/06/2023    3.   Symptoms of obstructive uropathy. Improved   4.   Distal duodenal and descending colon masses biopsy of the duodenal mass 03/04/2016 confirmed fragments of small bowel mucosa with focal high-grade dysplasia Biopsy of the descending colon "polyps" revealed fragments of a tubulovillous adenoma CT 08/29/2017-similar soft tissue thickening in the region of the splenic flexure of the colon suboptimally evaluated due to lack of colonic enteric contrast Referred to Dr. Elnoria Howard Referred to [facility] Resection of descending colon polyps 01/31/2018, tubulovillous adenomas with high-grade dysplasia Attempted endoscopic resection of the fourth segment duodenal "polyp" on 05/09/2018, firm unresectable lesion, biopsy revealed moderately differentiated adenocarcinoma CT abdomen/pelvis 05/25/2018- heterogeneously enhancing right lower pole renal lesion; diffuse sclerotic osseous metastatic lesions compatible with history of prostate cancer CT chest 05/25/2018- no pulmonary metastasis.  Diffuse osseous metastasis involving the axial and appendicular skeleton. 06/19/2018-sleeve resection of third and fourth portions of duodenum with primary end-to-end anastomosis, feeding gastrojejunostomy tube placement, Dr. Chauncey Mann; poorly differentiated adenocarcinoma duodenum, 3 cm; tumor invades the muscularis propria; lymphovascular invasion present; negative margins; 4 of 11 involved lymph nodes; soft tissue deposits also seen in the mesentery; all mucosal margins and soft tissue margins negative but 2 of the positive nodes are found in the soft tissue margin submitted for frozen section (pT2 pN2); second mucosal lesion 1.2 cm from the proximal margin, tubular adenoma with focal severe dysplasia Intact expression of mismatch repair proteins by IHC; microsatellite stable Cycle 1 adjuvant Xeloda 07/23/2018 Cycle 2  adjuvant Xeloda  08/13/2018 Cycle 3 adjuvant Xeloda 09/10/2018 (dose reduced to 1000 mg twice daily for 14 days)  Cycle 4 adjuvant Xeloda 10/01/2018 (dose increased to 1500 mg every morning and 1000 mg every afternoon for 14 days) Cycle 5 adjuvant Xeloda 10/22/2018 Cycle 6 adjuvant Xeloda 11/12/2018 Cycle 7 adjuvant Xeloda 12/03/2018 Cycle 8 adjuvant Xeloda 12/24/2018   5.  Anorexia/weight loss- resolved   6.  Undescended testicles   7.  Right renal mass.  CT 08/29/2017-right renal mass involving the lower pole was partially calcified and does demonstrate enhancement following contrast most consistent with renal cell carcinoma.  Lesion not significantly enlarged compared with the prior CT. Followed by urology. 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 to the IVC/RA junction, upper normal size retrocaval node 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, nuclear grade 3 Axitinib/pembrolizumab 10/22/2021 Axitinib placed on hold 11/05/2021 due to hand-foot syndrome Axitinib resumed at a dose of 5 mg daily 11/12/2021, pembrolizumab 11/12/2021 Axitinib discontinued 12/03/2021 secondary to progressive hand-foot syndrome, pembrolizumab 12/03/2021 Axitinib resumed at a reduced dose of 2 mg twice daily after office visit 12/14/2021 Pembrolizumab 12/24/2021 Axitinib stopped 01/03/2022 secondary to persistent hand/foot symptoms CT chest at [facility] 01/13/2022-no evidence of thoracic metastatic disease, similar  diffuse sclerotic change in the skeleton MRI abdomen at [facility] 01/13/2022-similar size of right lower pole renal mass with renal vein thrombosis, decreased overall extent of thrombus now extending to the intrahepatic IVC, enhancement of right renal mass and thrombus is decreased, new left hydroureter ureter nephrosis with asymmetric perinephric stranding Pembrolizumab 01/27/2022 Axitinib resumed 2 mg daily 01/28/2022 CT abdomen/pelvis at [facility] 02/10/2022-overall reduced size of right lower pole renal mass which demonstrates persistent region of enhancement along the posterior and lateral margin with extension into the renal sinus.  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  Query additional IVC thrombus at the intrahepatic IVC and extending into the right atrium. Axitinib continued 2 mg daily, Pembrolizumab every 3 weeks CT 05/05/2022 at [facility]-partially visualized pulmonary embolism involving segmental arteries of the right lower lobe.  Interval slight decrease in size of the mass arising from the lower pole the right kidney.  Mass demonstrates persistent moderate necrosis centrally.  Increased enhancing soft tissue component measuring up to 2 cm which is mildly increased in size compared to prior from 02/10/2022.  Bland tumor thrombus identified in the IVC at the level of the right renal vein ostium extending into the suprarenal IVC superiorly.  No obvious thrombus identified in the infrarenal, intrahepatic and suprahepatic IVC.  Likely trace bland thrombus in the anterior right renal vein.  Stable several subcentimeter retroperitoneal lymph nodes.  Diffuse osseous sclerotic and lytic metastatic disease stable compared to prior. Axitinib placed on hold 05/04/2022 due to pain bilateral feet; 05/13/2022 he will continue to hold axitinib Pembrolizumab 05/13/2022 Pembrolizumab 06/03/2022 Robotic right radical nephrectomy and IVC thrombectomy 06/27/2022-right kidney with tumor necrosis/hemorrhage,ypT0,  adherent thrombus with cellular necrosis and no viable carcinoma present at the renal vein margin, other margins negative for carcinoma, 0/3 lymph nodes.  6.5  cm tumor including intraparenchymal lesion and thrombus in the renal vein 8.  Diabetes   9.  Anemia-stool Hemoccults + March 2021, referred to Dr. Elnoria Howard for endoscopic evaluation, anemia also secondary to metastatic prostate cancer, hypogonadism Small bowel enteroscopy 02/11/2020-esophagus normal.  Hematin found in the gastric body.  Examined duodenum normal.  No evidence of significant pathology in the entire examined portion of the jejunum.  There was no overt source of bleeding or inflammation.  The bleeding may be from small AVMs.  Surgical anastomosis was not visible with repeated inspection of the duodenum. Colonoscopy 02/11/2020-multiple sessile and semipedunculated polyps were found in the rectum, descending colon and ascending colon, 3 to 18 mm in size.  Polyps removed (polypectomy descending colon-fragments of tubular adenoma, inflammatory type polyp; polypectomy ascending colon-tubular adenoma, no high-grade dysplasia or malignancy; polypectomy descending colon-fragments of tubulovillous adenoma, no high-grade dysplasia or malignancy; polypectomy colon, rectum-tubular adenoma, no high-grade dysplasia or malignancy). Colonoscopy 02/02/2021-seven 2 to 10 mm polyps in the sigmoid colon (tubular adenoma), descending colon (fragments of tubulovillous adenoma, fragments of tubular adenoma ) and transverse colon (fragments of tubular adenoma). Progressive anemia 07/05/2021; stool cards positive Upper endoscopy/colonoscopy 09/23/2021-multiple polyps removed from the colon-tubular adenomas, diffuse angioectasias in the stomach with bleeding-not amenable to endoscopic treatment 10.  Renal failure-progressive 01/17/2022, progressive following right nephrectomy October 2023 11.  CT chest at [facility] 01/13/2022-no evidence of thoracic metastatic disease, similar  diffuse sclerotic changes in the skeleton 12.  CT renal stone study 01/14/2022-mild left side hydroureteronephrosis secondary to a left UVJ stone, left nephrolithiasis, new right middle lobe collapse/consolidation, edema/fluid at the left iliopsoas muscle 13.  PE noted on CT 05/05/2022-on apixaban.  He saw Dr. Isaiah Serge at [facility] 05/16/2022; instructions are to hold apixaban 72 hours prior to scheduled surgery and start Lovenox at prophylactic dosing.  Disposition: Mr. Peters appears stable.  We reviewed the slight increase in the PSA last month.  We will follow-up on the value from today.  He will continue abiraterone.  He last received leuprolide 05/09/2023.  CBC from today shows stable anemia.  We reviewed various possible etiologies.  The anemia is likely multifactorial secondary to chronic disease, renal insufficiency, potentially prostate cancer, question GI blood loss on chronic anticoagulation.  We will follow-up on the iron studies from today.  He appears asymptomatic.  We discussed signs/symptoms suggestive of progressive anemia.  He will return for lab and follow-up in approximately 1 month.  We are available to see him sooner if needed.    Lonna Cobb ANP/GNP-BC   07/05/2023  1:50 PM

## 2023-07-06 ENCOUNTER — Inpatient Hospital Stay: Payer: Medicare Other

## 2023-07-06 ENCOUNTER — Inpatient Hospital Stay: Payer: Medicare Other | Admitting: Oncology

## 2023-07-06 LAB — PROSTATE-SPECIFIC AG, SERUM (LABCORP): Prostate Specific Ag, Serum: 0.5 ng/mL (ref 0.0–4.0)

## 2023-07-26 ENCOUNTER — Other Ambulatory Visit: Payer: Self-pay

## 2023-07-26 NOTE — Progress Notes (Signed)
Specialty Pharmacy Refill Coordination Note  Brandon Peters is a 67 y.o. male contacted today regarding refills of specialty medication(s) Abiraterone Acetate   Patient requested [name] at [facility] at [place] date: 08/03/23   Medication will be filled on 08/02/23.

## 2023-08-02 ENCOUNTER — Other Ambulatory Visit: Payer: Self-pay

## 2023-08-09 ENCOUNTER — Inpatient Hospital Stay: Payer: Medicare Other | Attending: Oncology

## 2023-08-09 ENCOUNTER — Inpatient Hospital Stay: Payer: Medicare Other | Admitting: Oncology

## 2023-08-09 VITALS — BP 121/67 | HR 80 | Temp 97.9°F | Resp 18 | Ht 65.0 in | Wt 153.7 lb

## 2023-08-09 DIAGNOSIS — E119 Type 2 diabetes mellitus without complications: Secondary | ICD-10-CM | POA: Insufficient documentation

## 2023-08-09 DIAGNOSIS — C61 Malignant neoplasm of prostate: Secondary | ICD-10-CM | POA: Insufficient documentation

## 2023-08-09 DIAGNOSIS — Z86711 Personal history of pulmonary embolism: Secondary | ICD-10-CM | POA: Insufficient documentation

## 2023-08-09 DIAGNOSIS — Q539 Undescended testicle, unspecified: Secondary | ICD-10-CM | POA: Diagnosis not present

## 2023-08-09 DIAGNOSIS — C7951 Secondary malignant neoplasm of bone: Secondary | ICD-10-CM | POA: Diagnosis not present

## 2023-08-09 DIAGNOSIS — Z7901 Long term (current) use of anticoagulants: Secondary | ICD-10-CM | POA: Insufficient documentation

## 2023-08-09 DIAGNOSIS — C641 Malignant neoplasm of right kidney, except renal pelvis: Secondary | ICD-10-CM

## 2023-08-09 DIAGNOSIS — D649 Anemia, unspecified: Secondary | ICD-10-CM

## 2023-08-09 DIAGNOSIS — C179 Malignant neoplasm of small intestine, unspecified: Secondary | ICD-10-CM

## 2023-08-09 DIAGNOSIS — C649 Malignant neoplasm of unspecified kidney, except renal pelvis: Secondary | ICD-10-CM | POA: Insufficient documentation

## 2023-08-09 LAB — CBC WITH DIFFERENTIAL (CANCER CENTER ONLY)
Abs Immature Granulocytes: 0.02 10*3/uL (ref 0.00–0.07)
Basophils Absolute: 0 10*3/uL (ref 0.0–0.1)
Basophils Relative: 0 %
Eosinophils Absolute: 0.1 10*3/uL (ref 0.0–0.5)
Eosinophils Relative: 1 %
HCT: 31.8 % — ABNORMAL LOW (ref 39.0–52.0)
Hemoglobin: 9.6 g/dL — ABNORMAL LOW (ref 13.0–17.0)
Immature Granulocytes: 0 %
Lymphocytes Relative: 14 %
Lymphs Abs: 1 10*3/uL (ref 0.7–4.0)
MCH: 29.8 pg (ref 26.0–34.0)
MCHC: 30.2 g/dL (ref 30.0–36.0)
MCV: 98.8 fL (ref 80.0–100.0)
Monocytes Absolute: 0.6 10*3/uL (ref 0.1–1.0)
Monocytes Relative: 8 %
Neutro Abs: 5.8 10*3/uL (ref 1.7–7.7)
Neutrophils Relative %: 77 %
Platelet Count: 195 10*3/uL (ref 150–400)
RBC: 3.22 MIL/uL — ABNORMAL LOW (ref 4.22–5.81)
RDW: 14.1 % (ref 11.5–15.5)
WBC Count: 7.6 10*3/uL (ref 4.0–10.5)
nRBC: 0 % (ref 0.0–0.2)

## 2023-08-09 LAB — CMP (CANCER CENTER ONLY)
ALT: 15 U/L (ref 0–44)
AST: 16 U/L (ref 15–41)
Albumin: 4.6 g/dL (ref 3.5–5.0)
Alkaline Phosphatase: 50 U/L (ref 38–126)
Anion gap: 9 (ref 5–15)
BUN: 48 mg/dL — ABNORMAL HIGH (ref 8–23)
CO2: 24 mmol/L (ref 22–32)
Calcium: 9.5 mg/dL (ref 8.9–10.3)
Chloride: 108 mmol/L (ref 98–111)
Creatinine: 2.77 mg/dL — ABNORMAL HIGH (ref 0.61–1.24)
GFR, Estimated: 25 mL/min — ABNORMAL LOW (ref >60)
Glucose, Bld: 114 mg/dL — ABNORMAL HIGH (ref 70–99)
Potassium: 4 mmol/L (ref 3.5–5.1)
Sodium: 141 mmol/L (ref 135–145)
Total Bilirubin: 0.5 mg/dL (ref <1.2)
Total Protein: 7.6 g/dL (ref 6.5–8.1)

## 2023-08-09 LAB — SAMPLE TO BLOOD BANK

## 2023-08-09 NOTE — Progress Notes (Signed)
Brandon Peters OFFICE PROGRESS NOTE   Diagnosis: Prostate cancer, renal cell carcinoma  INTERVAL HISTORY:   Brandon Peters returns as scheduled.  He feels well.  Good appetite and energy level.  No pain.  He saw nephrology 07/25/2023.  The hemoglobin returned at 9.5.  Objective:  Vital signs in last 24 hours:  Blood pressure 121/67, pulse 80, temperature 97.9 F (36.6 C), temperature source Temporal, resp. rate 18, height 5\' 5"  (1.651 m), weight 153 lb 11.2 oz (69.7 kg), SpO2 100%.   Resp: Decreased breath sounds at the right lower posterior chest, no respiratory distress Cardio: Regular rate and rhythm GI: Nontender, no hepatosplenomegaly Vascular: Trace ankle edema bilaterally, support stockings in place  Lab Results:  Lab Results  Component Value Date   WBC 7.6 08/09/2023   HGB 9.6 (L) 08/09/2023   HCT 31.8 (L) 08/09/2023   MCV 98.8 08/09/2023   PLT 195 08/09/2023   NEUTROABS 5.8 08/09/2023    CMP  Lab Results  Component Value Date   NA 141 07/05/2023   K 4.3 07/05/2023   CL 110 07/05/2023   CO2 21 (L) 07/05/2023   GLUCOSE 170 (H) 07/05/2023   BUN 45 (H) 07/05/2023   CREATININE 2.43 (H) 07/05/2023   CALCIUM 9.0 07/05/2023   PROT 6.4 (L) 07/05/2023   ALBUMIN 3.9 07/05/2023   AST 15 07/05/2023   ALT 14 07/05/2023   ALKPHOS 48 07/05/2023   BILITOT 0.4 07/05/2023   GFRNONAA 29 (L) 07/05/2023   GFRAA >60 03/05/2020    Lab Results  Component Value Date   CEA1 3.31 05/03/2019     Medications: I have reviewed the patient's current medications.   Assessment/Plan: History of Severe anemia-status post a red cell transfusion 03/04/2016 2.. Prostate cancer Extensive sclerotic bone metastases noted on a CT of the abdomen/pelvis 03/04/2016 and MRI abdomen 03/05/2016 Markedly elevated PSA Lupron initiated 03/15/2016; Casodex 14 days beginning 03/15/2016 Prostate biopsy 03/18/2016 05/09/2016 PSA significantly improved at 21 Abiraterone initiated  05/10/2016 PSA less than 0.1 07/10/2018 PSA less than 0.1 08/30/2018 PSA less than 0.1 10/18/2018 PSA less than 0.1 11/29/2018 PSA less than 0.1 on 11/06/2019  PSA less than 0.1 on 03/05/2020 PSA less than 0.1 on 07/06/2020 PSA less than 0.1 08/17/2021 PSA less than 0.1 on 09/30/2021 PSA 0.1 on 11/12/2021 PSA 0.1 on 02/18/2022 PSA 0.2 on 12/09/2022 PSA 0.3 on 04/04/2023  PSA 0.4 on 06/06/2023    3.   Symptoms of obstructive uropathy. Improved   4.   Distal duodenal and descending colon masses biopsy of the duodenal mass 03/04/2016 confirmed fragments of small bowel mucosa with focal high-grade dysplasia Biopsy of the descending colon "polyps" revealed fragments of a tubulovillous adenoma CT 08/29/2017-similar soft tissue thickening in the region of the splenic flexure of the colon suboptimally evaluated due to lack of colonic enteric contrast Referred to Dr. Elnoria Howard Referred to [facility] Resection of descending colon polyps 01/31/2018, tubulovillous adenomas with high-grade dysplasia Attempted endoscopic resection of the fourth segment duodenal "polyp" on 05/09/2018, firm unresectable lesion, biopsy revealed moderately differentiated adenocarcinoma CT abdomen/pelvis 05/25/2018- heterogeneously enhancing right lower pole renal lesion; diffuse sclerotic osseous metastatic lesions compatible with history of prostate cancer CT chest 05/25/2018- no pulmonary metastasis.  Diffuse osseous metastasis involving the axial and appendicular skeleton. 06/19/2018-sleeve resection of third and fourth portions of duodenum with primary end-to-end anastomosis, feeding gastrojejunostomy tube placement, Dr. Chauncey Mann; poorly differentiated adenocarcinoma duodenum, 3 cm; tumor invades the muscularis propria; lymphovascular invasion present; negative margins; 4 of 11  involved lymph nodes; soft tissue deposits also seen in the mesentery; all mucosal margins and soft tissue margins negative but 2 of the positive nodes are found in the soft  tissue margin submitted for frozen section (pT2 pN2); second mucosal lesion 1.2 cm from the proximal margin, tubular adenoma with focal severe dysplasia Intact expression of mismatch repair proteins by IHC; microsatellite stable Cycle 1 adjuvant Xeloda 07/23/2018 Cycle 2 adjuvant Xeloda 08/13/2018 Cycle 3 adjuvant Xeloda 09/10/2018 (dose reduced to 1000 mg twice daily for 14 days)  Cycle 4 adjuvant Xeloda 10/01/2018 (dose increased to 1500 mg every morning and 1000 mg every afternoon for 14 days) Cycle 5 adjuvant Xeloda 10/22/2018 Cycle 6 adjuvant Xeloda 11/12/2018 Cycle 7 adjuvant Xeloda 12/03/2018 Cycle 8 adjuvant Xeloda 12/24/2018   5.  Anorexia/weight loss- resolved   6.  Undescended testicles   7.  Right renal mass.  CT 08/29/2017-right renal mass involving the lower pole was partially calcified and does demonstrate enhancement following contrast most consistent with renal cell carcinoma.  Lesion not significantly enlarged compared with the prior CT. Followed by urology. 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 to the IVC/RA junction, upper normal size retrocaval node 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, nuclear grade 3 Axitinib/pembrolizumab 10/22/2021 Axitinib placed on hold 11/05/2021 due to hand-foot syndrome Axitinib resumed at a dose of 5 mg daily 11/12/2021, pembrolizumab 11/12/2021 Axitinib discontinued 12/03/2021 secondary to progressive hand-foot  syndrome, pembrolizumab 12/03/2021 Axitinib resumed at a reduced dose of 2 mg twice daily after office visit 12/14/2021 Pembrolizumab 12/24/2021 Axitinib stopped 01/03/2022 secondary to persistent hand/foot symptoms CT chest at [facility] 01/13/2022-no evidence of thoracic metastatic disease, similar diffuse sclerotic change in the skeleton MRI abdomen at [facility] 01/13/2022-similar size of right lower pole renal mass with renal vein thrombosis, decreased overall extent of thrombus now extending to the intrahepatic IVC, enhancement of right renal mass and thrombus is decreased, new left hydroureter ureter nephrosis with asymmetric perinephric stranding Pembrolizumab 01/27/2022 Axitinib resumed 2 mg daily 01/28/2022 CT abdomen/pelvis at [facility] 02/10/2022-overall reduced size of right lower pole renal mass which demonstrates persistent region of enhancement along the posterior and lateral margin with extension into the renal sinus.  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  Query additional IVC thrombus at the intrahepatic IVC and extending into the right atrium. Axitinib continued 2 mg daily, Pembrolizumab every 3 weeks CT 05/05/2022 at [facility]-partially visualized pulmonary embolism involving segmental arteries of the right lower lobe.  Interval slight decrease in size of the mass arising from the lower pole the right kidney.  Mass demonstrates persistent moderate necrosis centrally.  Increased enhancing soft tissue component measuring up to 2 cm which is mildly increased in size compared to prior from 02/10/2022.  Bland tumor thrombus identified in the IVC at the level of the right renal vein ostium extending into the suprarenal IVC superiorly.  No obvious thrombus identified in the infrarenal, intrahepatic and suprahepatic IVC.  Likely trace bland thrombus in the anterior right renal vein.  Stable several subcentimeter retroperitoneal lymph nodes.  Diffuse osseous sclerotic and lytic metastatic disease  stable compared to prior. Axitinib placed on hold 05/04/2022 due to pain bilateral feet; 05/13/2022 he will continue to hold axitinib Pembrolizumab 05/13/2022 Pembrolizumab 06/03/2022 Robotic right radical nephrectomy and IVC thrombectomy 06/27/2022-right kidney with tumor necrosis/hemorrhage,ypT0, adherent thrombus with cellular necrosis and no viable carcinoma present at the renal vein margin, other margins negative for carcinoma, 0/3 lymph nodes.  6.5 cm tumor including intraparenchymal lesion and thrombus in the renal vein 8.  Diabetes   9.  Anemia-stool Hemoccults + March 2021, referred to Dr. Elnoria Howard for endoscopic evaluation, anemia also secondary to metastatic prostate cancer, hypogonadism Small bowel enteroscopy 02/11/2020-esophagus normal.  Hematin found in the gastric body.  Examined duodenum normal.  No evidence of significant pathology in the entire examined portion of the jejunum.  There was no overt source of bleeding or inflammation.  The bleeding may be from small AVMs.  Surgical anastomosis was not visible with repeated inspection of the duodenum. Colonoscopy 02/11/2020-multiple sessile and semipedunculated polyps were found in the rectum, descending colon and ascending colon, 3 to 18 mm in size.  Polyps removed (polypectomy descending colon-fragments of tubular adenoma, inflammatory type polyp; polypectomy ascending colon-tubular adenoma, no high-grade dysplasia or malignancy; polypectomy descending colon-fragments of tubulovillous adenoma, no high-grade dysplasia or malignancy; polypectomy colon, rectum-tubular adenoma, no high-grade dysplasia or malignancy). Colonoscopy 02/02/2021-seven 2 to 10 mm polyps in the sigmoid colon (tubular adenoma), descending colon (fragments of tubulovillous adenoma, fragments of tubular adenoma ) and transverse colon (fragments of tubular adenoma). Progressive anemia 07/05/2021; stool cards positive Upper endoscopy/colonoscopy 09/23/2021-multiple polyps removed from  the colon-tubular adenomas, diffuse angioectasias in the stomach with bleeding-not amenable to endoscopic treatment 10.  Renal failure-progressive 01/17/2022, progressive following right nephrectomy October 2023 11.  CT chest at [facility] 01/13/2022-no evidence of thoracic metastatic disease, similar diffuse sclerotic changes in the skeleton 12.  CT renal stone study 01/14/2022-mild left side hydroureteronephrosis secondary to a left UVJ stone, left nephrolithiasis, new right middle lobe collapse/consolidation, edema/fluid at the left iliopsoas muscle 13.  PE noted on CT 05/05/2022-on apixaban.  He saw Dr. Isaiah Serge at [facility] 05/16/2022; instructions are to hold apixaban 72 hours prior to scheduled surgery and start Lovenox at prophylactic dosing.    Disposition: Brandon Peters appears stable.  The hemoglobin is improved compared to when he was here last month.  He continues abiraterone/prednisone for prostate cancer.  The PSA was slightly higher, but in the normal range when he was here last month.  He will return for an office visit and leuprolide in 1 month.  Thornton Papas, MD  08/09/2023  10:36 AM

## 2023-08-10 LAB — PROSTATE-SPECIFIC AG, SERUM (LABCORP): Prostate Specific Ag, Serum: 0.7 ng/mL (ref 0.0–4.0)

## 2023-08-25 ENCOUNTER — Other Ambulatory Visit (HOSPITAL_COMMUNITY): Payer: Self-pay

## 2023-08-27 ENCOUNTER — Other Ambulatory Visit: Payer: Self-pay | Admitting: Oncology

## 2023-08-28 ENCOUNTER — Other Ambulatory Visit (HOSPITAL_COMMUNITY): Payer: Self-pay

## 2023-08-28 ENCOUNTER — Other Ambulatory Visit: Payer: Self-pay

## 2023-08-28 ENCOUNTER — Encounter: Payer: Self-pay | Admitting: Oncology

## 2023-08-28 ENCOUNTER — Encounter (HOSPITAL_COMMUNITY): Payer: Self-pay

## 2023-08-28 NOTE — Progress Notes (Signed)
Specialty Pharmacy Refill Coordination Note  Brandon Peters is a 67 y.o. male contacted today regarding refills of specialty medication(s) Abiraterone Acetate   Patient requested [name] at [facility] at [place] date: 09/04/23   Medication will be filled on 09/01/23.

## 2023-09-01 ENCOUNTER — Other Ambulatory Visit (HOSPITAL_COMMUNITY): Payer: Self-pay

## 2023-09-01 ENCOUNTER — Encounter: Payer: Self-pay | Admitting: Podiatry

## 2023-09-01 ENCOUNTER — Other Ambulatory Visit: Payer: Self-pay

## 2023-09-01 ENCOUNTER — Ambulatory Visit: Payer: Medicare Other | Admitting: Podiatry

## 2023-09-01 DIAGNOSIS — B351 Tinea unguium: Secondary | ICD-10-CM

## 2023-09-01 DIAGNOSIS — L84 Corns and callosities: Secondary | ICD-10-CM | POA: Diagnosis not present

## 2023-09-01 DIAGNOSIS — M79674 Pain in right toe(s): Secondary | ICD-10-CM

## 2023-09-01 DIAGNOSIS — D689 Coagulation defect, unspecified: Secondary | ICD-10-CM | POA: Diagnosis not present

## 2023-09-01 DIAGNOSIS — M79675 Pain in left toe(s): Secondary | ICD-10-CM

## 2023-09-03 NOTE — Progress Notes (Signed)
Subjective:   Patient ID: Brandon Peters, male   DOB: 67 y.o.   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

## 2023-09-04 ENCOUNTER — Encounter: Payer: Self-pay | Admitting: Oncology

## 2023-09-04 ENCOUNTER — Other Ambulatory Visit: Payer: Self-pay

## 2023-09-04 ENCOUNTER — Other Ambulatory Visit (HOSPITAL_COMMUNITY): Payer: Self-pay

## 2023-09-06 ENCOUNTER — Other Ambulatory Visit: Payer: Self-pay | Admitting: Oncology

## 2023-09-11 ENCOUNTER — Encounter: Payer: Self-pay | Admitting: Nurse Practitioner

## 2023-09-11 ENCOUNTER — Inpatient Hospital Stay: Payer: Medicare Other

## 2023-09-11 ENCOUNTER — Inpatient Hospital Stay: Payer: Medicare Other | Attending: Oncology | Admitting: Nurse Practitioner

## 2023-09-11 ENCOUNTER — Other Ambulatory Visit: Payer: Medicare Other

## 2023-09-11 VITALS — BP 139/70 | HR 83 | Temp 98.1°F | Resp 20 | Ht 65.0 in | Wt 156.4 lb

## 2023-09-11 DIAGNOSIS — Z79899 Other long term (current) drug therapy: Secondary | ICD-10-CM | POA: Diagnosis not present

## 2023-09-11 DIAGNOSIS — C7951 Secondary malignant neoplasm of bone: Secondary | ICD-10-CM | POA: Insufficient documentation

## 2023-09-11 DIAGNOSIS — C61 Malignant neoplasm of prostate: Secondary | ICD-10-CM | POA: Diagnosis not present

## 2023-09-11 DIAGNOSIS — Z5111 Encounter for antineoplastic chemotherapy: Secondary | ICD-10-CM | POA: Insufficient documentation

## 2023-09-11 DIAGNOSIS — C179 Malignant neoplasm of small intestine, unspecified: Secondary | ICD-10-CM | POA: Diagnosis not present

## 2023-09-11 DIAGNOSIS — D649 Anemia, unspecified: Secondary | ICD-10-CM

## 2023-09-11 DIAGNOSIS — C641 Malignant neoplasm of right kidney, except renal pelvis: Secondary | ICD-10-CM | POA: Diagnosis not present

## 2023-09-11 LAB — CMP (CANCER CENTER ONLY)
ALT: 12 U/L (ref 0–44)
AST: 15 U/L (ref 15–41)
Albumin: 4.3 g/dL (ref 3.5–5.0)
Alkaline Phosphatase: 49 U/L (ref 38–126)
Anion gap: 8 (ref 5–15)
BUN: 46 mg/dL — ABNORMAL HIGH (ref 8–23)
CO2: 25 mmol/L (ref 22–32)
Calcium: 10 mg/dL (ref 8.9–10.3)
Chloride: 110 mmol/L (ref 98–111)
Creatinine: 2.5 mg/dL — ABNORMAL HIGH (ref 0.61–1.24)
GFR, Estimated: 28 mL/min — ABNORMAL LOW (ref >60)
Glucose, Bld: 118 mg/dL — ABNORMAL HIGH (ref 70–99)
Potassium: 3.9 mmol/L (ref 3.5–5.1)
Sodium: 143 mmol/L (ref 135–145)
Total Bilirubin: 0.4 mg/dL (ref <1.2)
Total Protein: 7.2 g/dL (ref 6.5–8.1)

## 2023-09-11 LAB — CBC WITH DIFFERENTIAL (CANCER CENTER ONLY)
Abs Immature Granulocytes: 0.04 10*3/uL (ref 0.00–0.07)
Basophils Absolute: 0 10*3/uL (ref 0.0–0.1)
Basophils Relative: 0 %
Eosinophils Absolute: 0.1 10*3/uL (ref 0.0–0.5)
Eosinophils Relative: 2 %
HCT: 33.2 % — ABNORMAL LOW (ref 39.0–52.0)
Hemoglobin: 9.8 g/dL — ABNORMAL LOW (ref 13.0–17.0)
Immature Granulocytes: 1 %
Lymphocytes Relative: 14 %
Lymphs Abs: 1.2 10*3/uL (ref 0.7–4.0)
MCH: 29.4 pg (ref 26.0–34.0)
MCHC: 29.5 g/dL — ABNORMAL LOW (ref 30.0–36.0)
MCV: 99.7 fL (ref 80.0–100.0)
Monocytes Absolute: 0.7 10*3/uL (ref 0.1–1.0)
Monocytes Relative: 9 %
Neutro Abs: 6.1 10*3/uL (ref 1.7–7.7)
Neutrophils Relative %: 74 %
Platelet Count: 216 10*3/uL (ref 150–400)
RBC: 3.33 MIL/uL — ABNORMAL LOW (ref 4.22–5.81)
RDW: 14.2 % (ref 11.5–15.5)
WBC Count: 8.2 10*3/uL (ref 4.0–10.5)
nRBC: 0 % (ref 0.0–0.2)

## 2023-09-11 MED ORDER — LEUPROLIDE ACETATE (4 MONTH) 30 MG ~~LOC~~ KIT
30.0000 mg | PACK | Freq: Once | SUBCUTANEOUS | Status: AC
  Administered 2023-09-11: 30 mg via SUBCUTANEOUS
  Filled 2023-09-11: qty 30

## 2023-09-11 NOTE — Progress Notes (Signed)
Brandon Peters OFFICE PROGRESS NOTE   Diagnosis: Prostate cancer, renal cell carcinoma  INTERVAL HISTORY:   Brandon Peters returns as scheduled.  He continues abiraterone.  Last leuprolide injection 05/09/2023.  He has periodic hot flashes.  No nausea or vomiting.  No diarrhea.  No rash.  He has a good appetite.  No bleeding.  Objective:  Vital signs in last 24 hours:  Blood pressure 139/70, pulse 83, temperature 98.1 F (36.7 C), temperature source Temporal, resp. rate 20, height 5\' 5"  (1.651 m), weight 156 lb 6.4 oz (70.9 kg), SpO2 100%.    Resp: Decreased breath sounds right lower lung field.  No respiratory distress. Cardio: Regular rate and rhythm. GI: No hepatosplenomegaly. Vascular: Trace edema lower leg bilaterally.   Lab Results:  Lab Results  Component Value Date   WBC 8.2 09/11/2023   HGB 9.8 (L) 09/11/2023   HCT 33.2 (L) 09/11/2023   MCV 99.7 09/11/2023   PLT 216 09/11/2023   NEUTROABS 6.1 09/11/2023    Imaging:  No results found.  Medications: I have reviewed the patient's current medications.  Assessment/Plan: History of Severe anemia-status post a red cell transfusion 03/04/2016 2.. Prostate cancer Extensive sclerotic bone metastases noted on a CT of the abdomen/pelvis 03/04/2016 and MRI abdomen 03/05/2016 Markedly elevated PSA Lupron initiated 03/15/2016; Casodex 14 days beginning 03/15/2016 Prostate biopsy 03/18/2016 05/09/2016 PSA significantly improved at 21 Abiraterone initiated 05/10/2016 PSA less than 0.1 07/10/2018 PSA less than 0.1 08/30/2018 PSA less than 0.1 10/18/2018 PSA less than 0.1 11/29/2018 PSA less than 0.1 on 11/06/2019  PSA less than 0.1 on 03/05/2020 PSA less than 0.1 on 07/06/2020 PSA less than 0.1 08/17/2021 PSA less than 0.1 on 09/30/2021 PSA 0.1 on 11/12/2021 PSA 0.1 on 02/18/2022 PSA 0.2 on 12/09/2022 PSA 0.3 on 04/04/2023  PSA 0.4 on 06/06/2023 PSA 0.5 on 07/05/2023 PSA 0.7 on 08/09/2023     3.   Symptoms of  obstructive uropathy. Improved   4.   Distal duodenal and descending colon masses biopsy of the duodenal mass 03/04/2016 confirmed fragments of small bowel mucosa with focal high-grade dysplasia Biopsy of the descending colon "polyps" revealed fragments of a tubulovillous adenoma CT 08/29/2017-similar soft tissue thickening in the region of the splenic flexure of the colon suboptimally evaluated due to lack of colonic enteric contrast Referred to Dr. Elnoria Howard Referred to [name] Resection of descending colon polyps 01/31/2018, tubulovillous adenomas with high-grade dysplasia Attempted endoscopic resection of the fourth segment duodenal "polyp" on 05/09/2018, firm unresectable lesion, biopsy revealed moderately differentiated adenocarcinoma CT abdomen/pelvis 05/25/2018- heterogeneously enhancing right lower pole renal lesion; diffuse sclerotic osseous metastatic lesions compatible with history of prostate cancer CT chest 05/25/2018- no pulmonary metastasis.  Diffuse osseous metastasis involving the axial and appendicular skeleton. 06/19/2018-sleeve resection of third and fourth portions of duodenum with primary end-to-end anastomosis, feeding gastrojejunostomy tube placement, Dr. Chauncey Mann; poorly differentiated adenocarcinoma duodenum, 3 cm; tumor invades the muscularis propria; lymphovascular invasion present; negative margins; 4 of 11 involved lymph nodes; soft tissue deposits also seen in the mesentery; all mucosal margins and soft tissue margins negative but 2 of the positive nodes are found in the soft tissue margin submitted for frozen section (pT2 pN2); second mucosal lesion 1.2 cm from the proximal margin, tubular adenoma with focal severe dysplasia Intact expression of mismatch repair proteins by IHC; microsatellite stable Cycle 1 adjuvant Xeloda 07/23/2018 Cycle 2 adjuvant Xeloda 08/13/2018 Cycle 3 adjuvant Xeloda 09/10/2018 (dose reduced to 1000 mg twice daily for 14 days)  Cycle  4 adjuvant Xeloda 10/01/2018  (dose increased to 1500 mg every morning and 1000 mg every afternoon for 14 days) Cycle 5 adjuvant Xeloda 10/22/2018 Cycle 6 adjuvant Xeloda 11/12/2018 Cycle 7 adjuvant Xeloda 12/03/2018 Cycle 8 adjuvant Xeloda 12/24/2018   5.  Anorexia/weight loss- resolved   6.  Undescended testicles   7.  Right renal mass.  CT 08/29/2017-right renal mass involving the lower pole was partially calcified and does demonstrate enhancement following contrast most consistent with renal cell carcinoma.  Lesion not significantly enlarged compared with the prior CT. Followed by urology. 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 to the IVC/RA junction, upper normal size retrocaval node 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, nuclear grade 3 Axitinib/pembrolizumab 10/22/2021 Axitinib placed on hold 11/05/2021 due to hand-foot syndrome Axitinib resumed at a dose of 5 mg daily 11/12/2021, pembrolizumab 11/12/2021 Axitinib discontinued 12/03/2021 secondary to progressive hand-foot syndrome, pembrolizumab 12/03/2021 Axitinib resumed at a reduced dose of 2 mg twice daily after office visit 12/14/2021 Pembrolizumab 12/24/2021 Axitinib stopped 01/03/2022 secondary to persistent hand/foot symptoms CT chest at [facility] 01/13/2022-no evidence of thoracic metastatic disease, similar diffuse sclerotic change in the skeleton MRI abdomen at [facility] 01/13/2022-similar size of right lower pole renal mass with renal vein  thrombosis, decreased overall extent of thrombus now extending to the intrahepatic IVC, enhancement of right renal mass and thrombus is decreased, new left hydroureter ureter nephrosis with asymmetric perinephric stranding Pembrolizumab 01/27/2022 Axitinib resumed 2 mg daily 01/28/2022 CT abdomen/pelvis at [facility] 02/10/2022-overall reduced size of right lower pole renal mass which demonstrates persistent region of enhancement along the posterior and lateral margin with extension into the renal sinus.  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  Query additional IVC thrombus at the intrahepatic IVC and extending into the right atrium. Axitinib continued 2 mg daily, Pembrolizumab every 3 weeks CT 05/05/2022 at [facility]-partially visualized pulmonary embolism involving segmental arteries of the right lower lobe.  Interval slight decrease in size of the mass arising from the lower pole the right kidney.  Mass demonstrates persistent moderate necrosis centrally.  Increased enhancing soft tissue component measuring up to 2 cm which is mildly increased in size compared to prior from 02/10/2022.  Bland tumor thrombus identified in the IVC at the level of the right renal vein ostium extending into the suprarenal IVC superiorly.  No obvious thrombus identified in the infrarenal, intrahepatic and suprahepatic IVC.  Likely trace bland thrombus in the anterior right renal vein.  Stable several subcentimeter retroperitoneal lymph nodes.  Diffuse osseous sclerotic and lytic metastatic disease stable compared to prior. Axitinib placed on hold 05/04/2022 due to pain bilateral feet; 05/13/2022 he will continue to hold axitinib Pembrolizumab 05/13/2022 Pembrolizumab 06/03/2022 Robotic right radical nephrectomy and IVC thrombectomy 06/27/2022-right kidney with tumor necrosis/hemorrhage,ypT0, adherent thrombus with cellular necrosis and no viable carcinoma present at the renal vein margin, other margins negative for  carcinoma, 0/3 lymph nodes.  6.5 cm tumor including intraparenchymal lesion and thrombus in the renal vein 8.  Diabetes   9.  Anemia-stool Hemoccults +  March 2021, referred to Dr. Elnoria Howard for endoscopic evaluation, anemia also secondary to metastatic prostate cancer, hypogonadism Small bowel enteroscopy 02/11/2020-esophagus normal.  Hematin found in the gastric body.  Examined duodenum normal.  No evidence of significant pathology in the entire examined portion of the jejunum.  There was no overt source of bleeding or inflammation.  The bleeding may be from small AVMs.  Surgical anastomosis was not visible with repeated inspection of the duodenum. Colonoscopy 02/11/2020-multiple sessile and semipedunculated polyps were found in the rectum, descending colon and ascending colon, 3 to 18 mm in size.  Polyps removed (polypectomy descending colon-fragments of tubular adenoma, inflammatory type polyp; polypectomy ascending colon-tubular adenoma, no high-grade dysplasia or malignancy; polypectomy descending colon-fragments of tubulovillous adenoma, no high-grade dysplasia or malignancy; polypectomy colon, rectum-tubular adenoma, no high-grade dysplasia or malignancy). Colonoscopy 02/02/2021-seven 2 to 10 mm polyps in the sigmoid colon (tubular adenoma), descending colon (fragments of tubulovillous adenoma, fragments of tubular adenoma ) and transverse colon (fragments of tubular adenoma). Progressive anemia 07/05/2021; stool cards positive Upper endoscopy/colonoscopy 09/23/2021-multiple polyps removed from the colon-tubular adenomas, diffuse angioectasias in the stomach with bleeding-not amenable to endoscopic treatment 10.  Renal failure-progressive 01/17/2022, progressive following right nephrectomy October 2023 11.  CT chest at [facility] 01/13/2022-no evidence of thoracic metastatic disease, similar diffuse sclerotic changes in the skeleton 12.  CT renal stone study 01/14/2022-mild left side hydroureteronephrosis secondary  to a left UVJ stone, left nephrolithiasis, new right middle lobe collapse/consolidation, edema/fluid at the left iliopsoas muscle 13.  PE noted on CT 05/05/2022-on apixaban.  He saw Dr. Isaiah Serge at [facility] 05/16/2022; instructions are to hold apixaban 72 hours prior to scheduled surgery and start Lovenox at prophylactic dosing.    Disposition: Mr. Schetter appears stable.  He continues abiraterone/prednisone for prostate cancer.  PSA appears to be slowly increasing in the normal range.  We will continue to monitor.  He is due for a leuprolide injection today.  CBC reviewed.  Counts are stable.  He will return for lab and follow-up in 1 month.  We are available to see him sooner if needed.  Lonna Cobb ANP/GNP-BC   09/11/2023  10:35 AM

## 2023-09-11 NOTE — Patient Instructions (Signed)

## 2023-09-12 ENCOUNTER — Telehealth: Payer: Self-pay

## 2023-09-12 LAB — PROSTATE-SPECIFIC AG, SERUM (LABCORP): Prostate Specific Ag, Serum: 0.7 ng/mL (ref 0.0–4.0)

## 2023-09-12 NOTE — Telephone Encounter (Signed)
-----   Message from Lonna Cobb sent at 09/12/2023  3:18 PM EST ----- Please let him know PSA is stable at 0.7.  Follow-up as scheduled.

## 2023-09-12 NOTE — Telephone Encounter (Signed)
Patient gave verbal understanding and had no further questions or concerns  

## 2023-09-26 ENCOUNTER — Other Ambulatory Visit: Payer: Self-pay

## 2023-09-26 ENCOUNTER — Other Ambulatory Visit (HOSPITAL_COMMUNITY): Payer: Self-pay

## 2023-09-26 NOTE — Progress Notes (Signed)
 Specialty Pharmacy Ongoing Clinical Assessment Note  Brandon Peters is a 67 y.o. male who is being followed by the specialty pharmacy service for RxSp Oncology   Patient's specialty medication(s) reviewed today: Abiraterone  Acetate (ZYTIGA )   Missed doses in the last 4 weeks: 0   Patient/Caregiver did not have any additional questions or concerns.   Therapeutic benefit summary: Patient is achieving benefit   Adverse events/side effects summary: No adverse events/side effects   Patient's therapy is appropriate to: Continue    Goals Addressed             This Visit's Progress    Slow Disease Progression       Patient is on track. Patient will maintain adherence. Patient's recent PSA was 0.07 which was unchanged from the previous level.          Follow up:  6 months  Silvano LOISE Dolly Specialty Pharmacist

## 2023-09-26 NOTE — Progress Notes (Signed)
 Specialty Pharmacy Refill Coordination Note  Brandon Peters is a 67 y.o. male contacted today regarding refills of specialty medication(s) Abiraterone  Acetate (ZYTIGA )   Patient requested [name] at [facility] at [place] date: 10/03/23   Medication will be filled on 01.06.25.

## 2023-09-27 ENCOUNTER — Other Ambulatory Visit: Payer: Self-pay | Admitting: Oncology

## 2023-09-28 ENCOUNTER — Encounter: Payer: Self-pay | Admitting: Oncology

## 2023-10-01 ENCOUNTER — Telehealth: Payer: Self-pay

## 2023-10-01 ENCOUNTER — Other Ambulatory Visit (HOSPITAL_COMMUNITY): Payer: Self-pay

## 2023-10-01 NOTE — Telephone Encounter (Signed)
 Oral Oncology Patient Advocate Encounter  Was successful in securing patient a $8,000.00 grant from [facility] to provide copayment coverage for Abiraterone .  This will keep the out of pocket expense at $0.     [employer] ID: [id number]   The billing information is as follows and has been shared with [facility].    RxBin: [id number] PCN: [id number] Member ID: [id number] Group ID: [id number] Dates of Eligibility: 08/31/23 through 08/29/24  Fund:  Prostate Cancer - Medicare Access   Morene Potters, CPhT Oncology Pharmacy Patient Advocate  [facility]  [phone] (phone) [phone] (fax)

## 2023-10-02 ENCOUNTER — Other Ambulatory Visit: Payer: Self-pay

## 2023-10-03 ENCOUNTER — Other Ambulatory Visit (HOSPITAL_COMMUNITY): Payer: Self-pay

## 2023-10-16 ENCOUNTER — Inpatient Hospital Stay: Payer: Medicare Other | Attending: Oncology

## 2023-10-16 ENCOUNTER — Inpatient Hospital Stay: Payer: Medicare Other | Admitting: Oncology

## 2023-10-16 VITALS — BP 152/73 | HR 80 | Temp 98.4°F | Resp 18 | Ht 65.0 in | Wt 157.5 lb

## 2023-10-16 DIAGNOSIS — N289 Disorder of kidney and ureter, unspecified: Secondary | ICD-10-CM | POA: Diagnosis not present

## 2023-10-16 DIAGNOSIS — D649 Anemia, unspecified: Secondary | ICD-10-CM

## 2023-10-16 DIAGNOSIS — C61 Malignant neoplasm of prostate: Secondary | ICD-10-CM

## 2023-10-16 DIAGNOSIS — C641 Malignant neoplasm of right kidney, except renal pelvis: Secondary | ICD-10-CM | POA: Diagnosis not present

## 2023-10-16 DIAGNOSIS — C7951 Secondary malignant neoplasm of bone: Secondary | ICD-10-CM | POA: Insufficient documentation

## 2023-10-16 DIAGNOSIS — Z7901 Long term (current) use of anticoagulants: Secondary | ICD-10-CM | POA: Insufficient documentation

## 2023-10-16 DIAGNOSIS — Z79899 Other long term (current) drug therapy: Secondary | ICD-10-CM | POA: Diagnosis not present

## 2023-10-16 DIAGNOSIS — Z905 Acquired absence of kidney: Secondary | ICD-10-CM | POA: Insufficient documentation

## 2023-10-16 DIAGNOSIS — Z5111 Encounter for antineoplastic chemotherapy: Secondary | ICD-10-CM | POA: Diagnosis present

## 2023-10-16 DIAGNOSIS — E119 Type 2 diabetes mellitus without complications: Secondary | ICD-10-CM | POA: Diagnosis not present

## 2023-10-16 DIAGNOSIS — Z86711 Personal history of pulmonary embolism: Secondary | ICD-10-CM | POA: Diagnosis not present

## 2023-10-16 DIAGNOSIS — C179 Malignant neoplasm of small intestine, unspecified: Secondary | ICD-10-CM

## 2023-10-16 DIAGNOSIS — D630 Anemia in neoplastic disease: Secondary | ICD-10-CM | POA: Diagnosis not present

## 2023-10-16 LAB — CBC WITH DIFFERENTIAL (CANCER CENTER ONLY)
Abs Immature Granulocytes: 0.03 10*3/uL (ref 0.00–0.07)
Basophils Absolute: 0 10*3/uL (ref 0.0–0.1)
Basophils Relative: 0 %
Eosinophils Absolute: 0.2 10*3/uL (ref 0.0–0.5)
Eosinophils Relative: 2 %
HCT: 31.2 % — ABNORMAL LOW (ref 39.0–52.0)
Hemoglobin: 9.5 g/dL — ABNORMAL LOW (ref 13.0–17.0)
Immature Granulocytes: 0 %
Lymphocytes Relative: 15 %
Lymphs Abs: 1.2 10*3/uL (ref 0.7–4.0)
MCH: 29.3 pg (ref 26.0–34.0)
MCHC: 30.4 g/dL (ref 30.0–36.0)
MCV: 96.3 fL (ref 80.0–100.0)
Monocytes Absolute: 0.7 10*3/uL (ref 0.1–1.0)
Monocytes Relative: 9 %
Neutro Abs: 5.9 10*3/uL (ref 1.7–7.7)
Neutrophils Relative %: 74 %
Platelet Count: 199 10*3/uL (ref 150–400)
RBC: 3.24 MIL/uL — ABNORMAL LOW (ref 4.22–5.81)
RDW: 14 % (ref 11.5–15.5)
WBC Count: 8 10*3/uL (ref 4.0–10.5)
nRBC: 0 % (ref 0.0–0.2)

## 2023-10-16 LAB — CMP (CANCER CENTER ONLY)
ALT: 13 U/L (ref 0–44)
AST: 15 U/L (ref 15–41)
Albumin: 4.1 g/dL (ref 3.5–5.0)
Alkaline Phosphatase: 55 U/L (ref 38–126)
Anion gap: 8 (ref 5–15)
BUN: 38 mg/dL — ABNORMAL HIGH (ref 8–23)
CO2: 23 mmol/L (ref 22–32)
Calcium: 9 mg/dL (ref 8.9–10.3)
Chloride: 110 mmol/L (ref 98–111)
Creatinine: 2.52 mg/dL — ABNORMAL HIGH (ref 0.61–1.24)
GFR, Estimated: 27 mL/min — ABNORMAL LOW (ref >60)
Glucose, Bld: 118 mg/dL — ABNORMAL HIGH (ref 70–99)
Potassium: 3.7 mmol/L (ref 3.5–5.1)
Sodium: 141 mmol/L (ref 135–145)
Total Bilirubin: 0.5 mg/dL (ref 0.0–1.2)
Total Protein: 6.9 g/dL (ref 6.5–8.1)

## 2023-10-16 NOTE — Progress Notes (Signed)
[facility] OFFICE PROGRESS NOTE   Diagnosis: Prostate cancer, renal cell carcinoma, small bowel carcinoma  INTERVAL HISTORY:   Brandon Peters returns as scheduled.  He continues abiraterone/prednisone.  He received leuprolide last month.  He feels well.  Good appetite.  No bleeding or pain.  He did "erythema "at the left conjunctiva today.  Objective:  Vital signs in last 24 hours:  Blood pressure (!) 152/73, pulse 80, temperature 98.4 F (36.9 C), temperature source Temporal, resp. rate 18, height 5\' 5"  (1.651 m), weight 157 lb 8 oz (71.4 kg), SpO2 100%.    HEENT: Thrush, left conjunctival hemorrhage Resp: Decreased breath sounds at the right lower chest with end inspiratory rhonchi, no respiratory distress Cardio: Regular rate and rhythm GI: Nontender, no hepatosplenomegaly, no mass Vascular: Trace left greater than right lower leg edema  Lab Results:  Lab Results  Component Value Date   WBC 8.0 10/16/2023   HGB 9.5 (L) 10/16/2023   HCT 31.2 (L) 10/16/2023   MCV 96.3 10/16/2023   PLT 199 10/16/2023   NEUTROABS 5.9 10/16/2023    CMP  Lab Results  Component Value Date   NA 141 10/16/2023   K 3.7 10/16/2023   CL 110 10/16/2023   CO2 23 10/16/2023   GLUCOSE 118 (H) 10/16/2023   BUN 38 (H) 10/16/2023   CREATININE 2.52 (H) 10/16/2023   CALCIUM 9.0 10/16/2023   PROT 6.9 10/16/2023   ALBUMIN 4.1 10/16/2023   AST 15 10/16/2023   ALT 13 10/16/2023   ALKPHOS 55 10/16/2023   BILITOT 0.5 10/16/2023   GFRNONAA 27 (L) 10/16/2023   GFRAA >60 03/05/2020    Lab Results  Component Value Date   CEA1 3.31 05/03/2019    Lab Results  Component Value Date   INR 1.0 10/13/2021   LABPROT 12.9 10/13/2021    Imaging:  No results found.  Medications: I have reviewed the patient's current medications.   Assessment/Plan:  History of Severe anemia-status post a red cell transfusion 03/04/2016 2.. Prostate cancer Extensive sclerotic bone metastases noted  on a CT of the abdomen/pelvis 03/04/2016 and MRI abdomen 03/05/2016 Markedly elevated PSA Lupron initiated 03/15/2016; Casodex 14 days beginning 03/15/2016 Prostate biopsy 03/18/2016 05/09/2016 PSA significantly improved at 21 Abiraterone initiated 05/10/2016 PSA less than 0.1 07/10/2018 PSA less than 0.1 08/30/2018 PSA less than 0.1 10/18/2018 PSA less than 0.1 11/29/2018 PSA less than 0.1 on 11/06/2019  PSA less than 0.1 on 03/05/2020 PSA less than 0.1 on 07/06/2020 PSA less than 0.1 08/17/2021 PSA less than 0.1 on 09/30/2021 PSA 0.1 on 11/12/2021 PSA 0.1 on 02/18/2022 PSA 0.2 on 12/09/2022 PSA 0.3 on 04/04/2023  PSA 0.4 on 06/06/2023 PSA 0.5 on 07/05/2023 PSA 0.7 on 08/09/2023     3.   Symptoms of obstructive uropathy. Improved   4.   Distal duodenal and descending colon masses biopsy of the duodenal mass 03/04/2016 confirmed fragments of small bowel mucosa with focal high-grade dysplasia Biopsy of the descending colon "polyps" revealed fragments of a tubulovillous adenoma CT 08/29/2017-similar soft tissue thickening in the region of the splenic flexure of the colon suboptimally evaluated due to lack of colonic enteric contrast Referred to Dr. Elnoria Howard Referred to [facility] Resection of descending colon polyps 01/31/2018, tubulovillous adenomas with high-grade dysplasia Attempted endoscopic resection of the fourth segment duodenal "polyp" on 05/09/2018, firm unresectable lesion, biopsy revealed moderately differentiated adenocarcinoma CT abdomen/pelvis 05/25/2018- heterogeneously enhancing right lower pole renal lesion; diffuse sclerotic osseous metastatic lesions compatible with history of prostate cancer  CT chest 05/25/2018- no pulmonary metastasis.  Diffuse osseous metastasis involving the axial and appendicular skeleton. 06/19/2018-sleeve resection of third and fourth portions of duodenum with primary end-to-end anastomosis, feeding gastrojejunostomy tube placement, Dr. Chauncey Mann; poorly differentiated  adenocarcinoma duodenum, 3 cm; tumor invades the muscularis propria; lymphovascular invasion present; negative margins; 4 of 11 involved lymph nodes; soft tissue deposits also seen in the mesentery; all mucosal margins and soft tissue margins negative but 2 of the positive nodes are found in the soft tissue margin submitted for frozen section (pT2 pN2); second mucosal lesion 1.2 cm from the proximal margin, tubular adenoma with focal severe dysplasia Intact expression of mismatch repair proteins by IHC; microsatellite stable Cycle 1 adjuvant Xeloda 07/23/2018 Cycle 2 adjuvant Xeloda 08/13/2018 Cycle 3 adjuvant Xeloda 09/10/2018 (dose reduced to 1000 mg twice daily for 14 days)  Cycle 4 adjuvant Xeloda 10/01/2018 (dose increased to 1500 mg every morning and 1000 mg every afternoon for 14 days) Cycle 5 adjuvant Xeloda 10/22/2018 Cycle 6 adjuvant Xeloda 11/12/2018 Cycle 7 adjuvant Xeloda 12/03/2018 Cycle 8 adjuvant Xeloda 12/24/2018   5.  Anorexia/weight loss- resolved   6.  Undescended testicles   7.  Right renal mass.  CT 08/29/2017-right renal mass involving the lower pole was partially calcified and does demonstrate enhancement following contrast most consistent with renal cell carcinoma.  Lesion not significantly enlarged compared with the prior CT. Followed by urology. 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 to the IVC/RA junction, upper normal size retrocaval node Biopsy right renal mass  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, nuclear grade 3 Axitinib/pembrolizumab 10/22/2021 Axitinib placed on hold 11/05/2021 due to hand-foot syndrome Axitinib resumed at a dose of 5 mg daily 11/12/2021, pembrolizumab 11/12/2021 Axitinib discontinued 12/03/2021 secondary to progressive hand-foot syndrome, pembrolizumab 12/03/2021 Axitinib resumed at a reduced dose of 2 mg twice daily after office visit 12/14/2021 Pembrolizumab 12/24/2021 Axitinib stopped 01/03/2022 secondary to persistent hand/foot symptoms CT chest at [facility] 01/13/2022-no evidence of thoracic metastatic disease, similar diffuse sclerotic change in the skeleton MRI abdomen at [facility] 01/13/2022-similar size of right lower pole renal mass with renal vein thrombosis, decreased overall extent of thrombus now extending to the intrahepatic IVC, enhancement of right renal mass and thrombus is decreased, new left hydroureter ureter nephrosis with asymmetric perinephric stranding Pembrolizumab 01/27/2022 Axitinib resumed 2 mg daily 01/28/2022 CT abdomen/pelvis at [facility] 02/10/2022-overall reduced size of right lower pole renal mass which demonstrates persistent region of enhancement along the posterior and lateral margin with extension into the renal sinus.  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  Query additional IVC thrombus at the intrahepatic IVC and extending into the right atrium. Axitinib continued 2 mg daily, Pembrolizumab every 3 weeks CT 05/05/2022 at [facility]-partially visualized pulmonary embolism involving segmental arteries of the right lower lobe.  Interval slight decrease in size of the mass arising from the lower pole the right kidney.  Mass demonstrates persistent moderate necrosis centrally.  Increased enhancing soft tissue component measuring up to 2 cm which is mildly increased in size compared to prior from 02/10/2022.  Bland tumor thrombus identified in the IVC at the level of the right renal vein ostium extending into  the suprarenal IVC superiorly.  No obvious thrombus identified in the infrarenal, intrahepatic and suprahepatic IVC.  Likely trace bland thrombus in the anterior right renal vein.  Stable several subcentimeter retroperitoneal lymph nodes.  Diffuse osseous sclerotic and lytic metastatic disease stable compared to prior. Axitinib placed on hold 05/04/2022 due to pain bilateral feet; 05/13/2022 he will continue to hold axitinib Pembrolizumab 05/13/2022 Pembrolizumab 06/03/2022 Robotic right radical nephrectomy and IVC thrombectomy 06/27/2022-right kidney with tumor necrosis/hemorrhage,ypT0, adherent thrombus with cellular necrosis and no viable carcinoma present at the renal vein margin, other margins negative for carcinoma, 0/3 lymph nodes.  6.5 cm tumor including intraparenchymal lesion and thrombus in the renal vein 8.  Diabetes   9.  Anemia-stool Hemoccults + March 2021, referred to Dr. Elnoria Howard for endoscopic evaluation, anemia also secondary to metastatic prostate cancer, hypogonadism Small bowel enteroscopy 02/11/2020-esophagus normal.  Hematin found in the gastric body.  Examined duodenum normal.  No evidence of significant pathology in the entire examined portion of the jejunum.  There was no overt source of bleeding or inflammation.  The bleeding may be from small AVMs.  Surgical anastomosis was not visible with repeated inspection of the duodenum. Colonoscopy 02/11/2020-multiple sessile and semipedunculated polyps were found in the rectum, descending colon and ascending colon, 3 to 18 mm in size.  Polyps removed (polypectomy descending colon-fragments of tubular adenoma, inflammatory type polyp; polypectomy ascending colon-tubular adenoma, no high-grade dysplasia or malignancy; polypectomy descending colon-fragments of tubulovillous adenoma, no high-grade dysplasia or malignancy; polypectomy colon, rectum-tubular adenoma, no high-grade dysplasia or malignancy). Colonoscopy 02/02/2021-seven 2 to 10 mm polyps in  the sigmoid colon (tubular adenoma), descending colon (fragments of tubulovillous adenoma, fragments of tubular adenoma ) and transverse colon (fragments of tubular adenoma). Progressive anemia 07/05/2021; stool cards positive Upper endoscopy/colonoscopy 09/23/2021-multiple polyps removed from the colon-tubular adenomas, diffuse angioectasias in the stomach with bleeding-not amenable to endoscopic treatment 10.  Renal failure-progressive 01/17/2022, progressive following right nephrectomy October 2023 11.  CT chest at [facility] 01/13/2022-no evidence of thoracic metastatic disease, similar diffuse sclerotic changes in the skeleton 12.  CT renal stone study 01/14/2022-mild left side hydroureteronephrosis secondary to a left UVJ stone, left nephrolithiasis, new right middle lobe collapse/consolidation, edema/fluid at the left iliopsoas muscle 13.  PE noted on CT 05/05/2022-on apixaban.  He saw Dr. Isaiah Serge at [facility] 05/16/2022; instructions are to hold apixaban 72 hours prior to scheduled surgery and start Lovenox at prophylactic dosing.  Disposition: Mr Mcginley appears unchanged.  The PSA has been higher over the past several months, but remains within the normal range.  There is no clinical evidence of disease progression.  He has persistent anemia secondary to metastatic prostate cancer, chronic disease, and renal insufficiency.  The hemoglobin is stable.  The plan is to continue Lupron and abiraterone/prednisone.  He will return for an office visit and leuprolide in 2 months.  He appears to have a left conjunctival hemorrhage.  He will call if this does not improve over the next few days.  Brandon Papas, MD  10/16/2023  11:10 AM

## 2023-10-17 LAB — PROSTATE-SPECIFIC AG, SERUM (LABCORP): Prostate Specific Ag, Serum: 1 ng/mL (ref 0.0–4.0)

## 2023-10-23 ENCOUNTER — Other Ambulatory Visit: Payer: Self-pay

## 2023-10-23 NOTE — Progress Notes (Signed)
Specialty Pharmacy Refill Coordination Note  Brandon Peters is a 68 y.o. male contacted today regarding refills of specialty medication(s) Abiraterone Acetate [name])   Patient requested Pickup at [facility] at [place] date: 11/02/23   Medication will be filled on 11/01/23.

## 2023-11-01 ENCOUNTER — Other Ambulatory Visit (HOSPITAL_COMMUNITY): Payer: Self-pay

## 2023-11-01 ENCOUNTER — Other Ambulatory Visit: Payer: Self-pay

## 2023-11-23 ENCOUNTER — Other Ambulatory Visit (HOSPITAL_COMMUNITY): Payer: Self-pay

## 2023-11-23 ENCOUNTER — Other Ambulatory Visit: Payer: Self-pay | Admitting: Oncology

## 2023-11-23 ENCOUNTER — Other Ambulatory Visit: Payer: Self-pay

## 2023-11-23 DIAGNOSIS — C61 Malignant neoplasm of prostate: Secondary | ICD-10-CM

## 2023-11-23 MED ORDER — ABIRATERONE ACETATE 250 MG PO TABS
ORAL_TABLET | ORAL | 5 refills | Status: DC
  Filled 2023-11-23: qty 120, 30d supply, fill #0
  Filled 2023-12-26: qty 120, 30d supply, fill #1
  Filled 2024-01-25: qty 120, 30d supply, fill #2
  Filled 2024-02-23: qty 120, 30d supply, fill #3
  Filled 2024-03-25: qty 120, 30d supply, fill #4

## 2023-11-23 NOTE — Progress Notes (Signed)
 Clinical Intervention Note  Clinical Intervention Notes: The patient reports that he added on Jardiance, DDI with Zytiga, can increase the likelihood of hypoglycemia, patient was counseled on this drug interaction as well as signs and symptoms of hypoglycemia.  He was understanding and reports that he monitors his blood sugar frequently and has had no signs of hypoglycemia since starting the Jardiance.   Clinical Intervention Outcomes: Prevention of an adverse drug event   Brandon Peters

## 2023-11-23 NOTE — Progress Notes (Signed)
 Specialty Pharmacy Refill Coordination Note  Alann Avey is a 68 y.o. male contacted today regarding refills of specialty medication(s) Abiraterone Acetate [name])   Patient requested Pickup at [facility] at [place] date: 12/01/23   Medication will be filled on 11/30/23. This fill date is pending response to refill request from provider. Patient is aware and if they have not received fill by intended date they must follow up with pharmacy.

## 2023-11-24 ENCOUNTER — Other Ambulatory Visit: Payer: Self-pay

## 2023-11-30 ENCOUNTER — Other Ambulatory Visit: Payer: Self-pay

## 2023-12-04 ENCOUNTER — Other Ambulatory Visit: Payer: Self-pay | Admitting: Oncology

## 2023-12-05 ENCOUNTER — Encounter: Payer: Self-pay | Admitting: Oncology

## 2023-12-07 ENCOUNTER — Ambulatory Visit: Payer: Medicare Other

## 2023-12-07 ENCOUNTER — Inpatient Hospital Stay: Payer: Medicare Other | Admitting: Oncology

## 2023-12-07 ENCOUNTER — Inpatient Hospital Stay: Payer: Medicare Other | Attending: Oncology

## 2023-12-07 VITALS — BP 115/66 | HR 84 | Temp 98.1°F | Resp 18 | Ht 65.0 in | Wt 154.6 lb

## 2023-12-07 DIAGNOSIS — Z905 Acquired absence of kidney: Secondary | ICD-10-CM | POA: Diagnosis not present

## 2023-12-07 DIAGNOSIS — D631 Anemia in chronic kidney disease: Secondary | ICD-10-CM | POA: Diagnosis not present

## 2023-12-07 DIAGNOSIS — C7951 Secondary malignant neoplasm of bone: Secondary | ICD-10-CM | POA: Diagnosis present

## 2023-12-07 DIAGNOSIS — C641 Malignant neoplasm of right kidney, except renal pelvis: Secondary | ICD-10-CM | POA: Insufficient documentation

## 2023-12-07 DIAGNOSIS — C61 Malignant neoplasm of prostate: Secondary | ICD-10-CM | POA: Insufficient documentation

## 2023-12-07 DIAGNOSIS — E119 Type 2 diabetes mellitus without complications: Secondary | ICD-10-CM | POA: Insufficient documentation

## 2023-12-07 DIAGNOSIS — D630 Anemia in neoplastic disease: Secondary | ICD-10-CM | POA: Diagnosis not present

## 2023-12-07 DIAGNOSIS — Q539 Undescended testicle, unspecified: Secondary | ICD-10-CM | POA: Diagnosis not present

## 2023-12-07 DIAGNOSIS — N189 Chronic kidney disease, unspecified: Secondary | ICD-10-CM | POA: Insufficient documentation

## 2023-12-07 DIAGNOSIS — Z86711 Personal history of pulmonary embolism: Secondary | ICD-10-CM | POA: Diagnosis not present

## 2023-12-07 DIAGNOSIS — D649 Anemia, unspecified: Secondary | ICD-10-CM

## 2023-12-07 LAB — CBC WITH DIFFERENTIAL (CANCER CENTER ONLY)
Abs Immature Granulocytes: 0.04 10*3/uL (ref 0.00–0.07)
Basophils Absolute: 0 10*3/uL (ref 0.0–0.1)
Basophils Relative: 0 %
Eosinophils Absolute: 0.1 10*3/uL (ref 0.0–0.5)
Eosinophils Relative: 1 %
HCT: 30.6 % — ABNORMAL LOW (ref 39.0–52.0)
Hemoglobin: 9.2 g/dL — ABNORMAL LOW (ref 13.0–17.0)
Immature Granulocytes: 1 %
Lymphocytes Relative: 14 %
Lymphs Abs: 1.2 10*3/uL (ref 0.7–4.0)
MCH: 29.5 pg (ref 26.0–34.0)
MCHC: 30.1 g/dL (ref 30.0–36.0)
MCV: 98.1 fL (ref 80.0–100.0)
Monocytes Absolute: 0.7 10*3/uL (ref 0.1–1.0)
Monocytes Relative: 8 %
Neutro Abs: 6.3 10*3/uL (ref 1.7–7.7)
Neutrophils Relative %: 76 %
Platelet Count: 224 10*3/uL (ref 150–400)
RBC: 3.12 MIL/uL — ABNORMAL LOW (ref 4.22–5.81)
RDW: 14.6 % (ref 11.5–15.5)
WBC Count: 8.3 10*3/uL (ref 4.0–10.5)
nRBC: 0 % (ref 0.0–0.2)

## 2023-12-07 LAB — CMP (CANCER CENTER ONLY)
ALT: 12 U/L (ref 0–44)
AST: 15 U/L (ref 15–41)
Albumin: 4.2 g/dL (ref 3.5–5.0)
Alkaline Phosphatase: 54 U/L (ref 38–126)
Anion gap: 10 (ref 5–15)
BUN: 41 mg/dL — ABNORMAL HIGH (ref 8–23)
CO2: 22 mmol/L (ref 22–32)
Calcium: 9.1 mg/dL (ref 8.9–10.3)
Chloride: 110 mmol/L (ref 98–111)
Creatinine: 2.46 mg/dL — ABNORMAL HIGH (ref 0.61–1.24)
GFR, Estimated: 28 mL/min — ABNORMAL LOW (ref >60)
Glucose, Bld: 101 mg/dL — ABNORMAL HIGH (ref 70–99)
Potassium: 3.6 mmol/L (ref 3.5–5.1)
Sodium: 142 mmol/L (ref 135–145)
Total Bilirubin: 0.5 mg/dL (ref 0.0–1.2)
Total Protein: 6.8 g/dL (ref 6.5–8.1)

## 2023-12-07 NOTE — Progress Notes (Signed)
 [facility] OFFICE PROGRESS NOTE   Diagnosis: Prostate cancer, renal cell carcinoma  INTERVAL HISTORY:   [name] returns as scheduled.  He feels well.  Good appetite.  No bleeding.  He reports malaise.  No dyspnea.  He continues abiraterone.  Objective:  Vital signs in last 24 hours:  Blood pressure 115/66, pulse 84, temperature 98.1 F (36.7 C), temperature source Temporal, resp. rate 18, height 5\' 5"  (1.651 m), weight 154 lb 9.6 oz (70.1 kg), SpO2 100%.    Resp: End inspiratory rhonchi at the posterior base bilaterally, no respiratory distress Cardio: Regular rate and rhythm GI: Mass, nontender, no hepatosplenomegaly Vascular: Lower leg edema on the left greater than right  Lab Results:  Lab Results  Component Value Date   WBC 8.3 12/07/2023   HGB 9.2 (L) 12/07/2023   HCT 30.6 (L) 12/07/2023   MCV 98.1 12/07/2023   PLT 224 12/07/2023   NEUTROABS 6.3 12/07/2023    CMP  Lab Results  Component Value Date   NA 142 12/07/2023   K 3.6 12/07/2023   CL 110 12/07/2023   CO2 22 12/07/2023   GLUCOSE 101 (H) 12/07/2023   BUN 41 (H) 12/07/2023   CREATININE 2.46 (H) 12/07/2023   CALCIUM 9.1 12/07/2023   PROT 6.8 12/07/2023   ALBUMIN 4.2 12/07/2023   AST 15 12/07/2023   ALT 12 12/07/2023   ALKPHOS 54 12/07/2023   BILITOT 0.5 12/07/2023   GFRNONAA 28 (L) 12/07/2023   GFRAA >60 03/05/2020    Lab Results  Component Value Date   CEA1 3.31 05/03/2019   Medications: I have reviewed the patient's current medications.   Assessment/Plan: History of Severe anemia-status post a red cell transfusion 03/04/2016 2.. Prostate cancer Extensive sclerotic bone metastases noted on a CT of the abdomen/pelvis 03/04/2016 and MRI abdomen 03/05/2016 Markedly elevated PSA Lupron initiated 03/15/2016; Casodex 14 days beginning 03/15/2016 Prostate biopsy 03/18/2016 05/09/2016 PSA significantly improved at 21 Abiraterone initiated 05/10/2016 PSA less than 0.1  07/10/2018 PSA less than 0.1 08/30/2018 PSA less than 0.1 10/18/2018 PSA less than 0.1 11/29/2018 PSA less than 0.1 on 11/06/2019  PSA less than 0.1 on 03/05/2020 PSA less than 0.1 on 07/06/2020 PSA less than 0.1 08/17/2021 PSA less than 0.1 on 09/30/2021 PSA 0.1 on 11/12/2021 PSA 0.1 on 02/18/2022 PSA 0.2 on 12/09/2022 PSA 0.3 on 04/04/2023  PSA 0.4 on 06/06/2023 PSA 0.5 on 07/05/2023 PSA 0.7 on 08/09/2023     3.   Symptoms of obstructive uropathy. Improved   4.   Distal duodenal and descending colon masses biopsy of the duodenal mass 03/04/2016 confirmed fragments of small bowel mucosa with focal high-grade dysplasia Biopsy of the descending colon "polyps" revealed fragments of a tubulovillous adenoma CT 08/29/2017-similar soft tissue thickening in the region of the splenic flexure of the colon suboptimally evaluated due to lack of colonic enteric contrast Referred to Dr. Elnoria Howard Referred to [facility] Resection of descending colon polyps 01/31/2018, tubulovillous adenomas with high-grade dysplasia Attempted endoscopic resection of the fourth segment duodenal "polyp" on 05/09/2018, firm unresectable lesion, biopsy revealed moderately differentiated adenocarcinoma CT abdomen/pelvis 05/25/2018- heterogeneously enhancing right lower pole renal lesion; diffuse sclerotic osseous metastatic lesions compatible with history of prostate cancer CT chest 05/25/2018- no pulmonary metastasis.  Diffuse osseous metastasis involving the axial and appendicular skeleton. 06/19/2018-sleeve resection of third and fourth portions of duodenum with primary end-to-end anastomosis, feeding gastrojejunostomy tube placement, Dr. Chauncey Mann; poorly differentiated adenocarcinoma duodenum, 3 cm; tumor invades the muscularis propria; lymphovascular invasion present; negative  margins; 4 of 11 involved lymph nodes; soft tissue deposits also seen in the mesentery; all mucosal margins and soft tissue margins negative but 2 of the positive nodes are found  in the soft tissue margin submitted for frozen section (pT2 pN2); second mucosal lesion 1.2 cm from the proximal margin, tubular adenoma with focal severe dysplasia Intact expression of mismatch repair proteins by IHC; microsatellite stable Cycle 1 adjuvant Xeloda 07/23/2018 Cycle 2 adjuvant Xeloda 08/13/2018 Cycle 3 adjuvant Xeloda 09/10/2018 (dose reduced to 1000 mg twice daily for 14 days)  Cycle 4 adjuvant Xeloda 10/01/2018 (dose increased to 1500 mg every morning and 1000 mg every afternoon for 14 days) Cycle 5 adjuvant Xeloda 10/22/2018 Cycle 6 adjuvant Xeloda 11/12/2018 Cycle 7 adjuvant Xeloda 12/03/2018 Cycle 8 adjuvant Xeloda 12/24/2018   5.  Anorexia/weight loss- resolved   6.  Undescended testicles   7.  Right renal mass.  CT 08/29/2017-right renal mass involving the lower pole was partially calcified and does demonstrate enhancement following contrast most consistent with renal cell carcinoma.  Lesion not significantly enlarged compared with the prior CT. Followed by urology. 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 to the IVC/RA junction, upper normal size retrocaval node 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, nuclear grade 3 Axitinib/pembrolizumab 10/22/2021 Axitinib placed on hold 11/05/2021 due to hand-foot syndrome Axitinib resumed at a dose of 5 mg daily 11/12/2021, pembrolizumab 11/12/2021 Axitinib discontinued 12/03/2021 secondary to progressive  hand-foot syndrome, pembrolizumab 12/03/2021 Axitinib resumed at a reduced dose of 2 mg twice daily after office visit 12/14/2021 Pembrolizumab 12/24/2021 Axitinib stopped 01/03/2022 secondary to persistent hand/foot symptoms CT chest at [facility] 01/13/2022-no evidence of thoracic metastatic disease, similar diffuse sclerotic change in the skeleton MRI abdomen at [facility] 01/13/2022-similar size of right lower pole renal mass with renal vein thrombosis, decreased overall extent of thrombus now extending to the intrahepatic IVC, enhancement of right renal mass and thrombus is decreased, new left hydroureter ureter nephrosis with asymmetric perinephric stranding Pembrolizumab 01/27/2022 Axitinib resumed 2 mg daily 01/28/2022 CT abdomen/pelvis at [facility] 02/10/2022-overall reduced size of right lower pole renal mass which demonstrates persistent region of enhancement along the posterior and lateral margin with extension into the renal sinus.  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  Query additional IVC thrombus at the intrahepatic IVC and extending into the right atrium. Axitinib continued 2 mg daily, Pembrolizumab every 3 weeks CT 05/05/2022 at [facility]-partially visualized pulmonary embolism involving segmental arteries of the right lower lobe.  Interval slight decrease in size of the mass arising from the lower pole the right kidney.  Mass demonstrates persistent moderate necrosis centrally.  Increased enhancing soft tissue component measuring up to 2 cm which is mildly increased in size compared to prior from 02/10/2022.  Bland tumor thrombus identified in the IVC at the level of the right renal vein ostium extending into the suprarenal IVC superiorly.  No obvious thrombus identified in the infrarenal, intrahepatic and suprahepatic IVC.  Likely trace bland thrombus in the anterior right renal vein.  Stable several subcentimeter  retroperitoneal lymph nodes.  Diffuse osseous sclerotic and lytic metastatic  disease stable compared to prior. Axitinib placed on hold 05/04/2022 due to pain bilateral feet; 05/13/2022 he will continue to hold axitinib Pembrolizumab 05/13/2022 Pembrolizumab 06/03/2022 Robotic right radical nephrectomy and IVC thrombectomy 06/27/2022-right kidney with tumor necrosis/hemorrhage,ypT0, adherent thrombus with cellular necrosis and no viable carcinoma present at the renal vein margin, other margins negative for carcinoma, 0/3 lymph nodes.  6.5 cm tumor including intraparenchymal lesion and thrombus in the renal vein 8.  Diabetes   9.  Anemia-stool Hemoccults + March 2021, referred to Dr. Elnoria Howard for endoscopic evaluation, anemia also secondary to metastatic prostate cancer, hypogonadism Small bowel enteroscopy 02/11/2020-esophagus normal.  Hematin found in the gastric body.  Examined duodenum normal.  No evidence of significant pathology in the entire examined portion of the jejunum.  There was no overt source of bleeding or inflammation.  The bleeding may be from small AVMs.  Surgical anastomosis was not visible with repeated inspection of the duodenum. Colonoscopy 02/11/2020-multiple sessile and semipedunculated polyps were found in the rectum, descending colon and ascending colon, 3 to 18 mm in size.  Polyps removed (polypectomy descending colon-fragments of tubular adenoma, inflammatory type polyp; polypectomy ascending colon-tubular adenoma, no high-grade dysplasia or malignancy; polypectomy descending colon-fragments of tubulovillous adenoma, no high-grade dysplasia or malignancy; polypectomy colon, rectum-tubular adenoma, no high-grade dysplasia or malignancy). Colonoscopy 02/02/2021-seven 2 to 10 mm polyps in the sigmoid colon (tubular adenoma), descending colon (fragments of tubulovillous adenoma, fragments of tubular adenoma ) and transverse colon (fragments of tubular adenoma). Progressive anemia 07/05/2021; stool cards positive Upper endoscopy/colonoscopy 09/23/2021-multiple polyps  removed from the colon-tubular adenomas, diffuse angioectasias in the stomach with bleeding-not amenable to endoscopic treatment 10.  Renal failure-progressive 01/17/2022, progressive following right nephrectomy October 2023 11.  CT chest at [facility] 01/13/2022-no evidence of thoracic metastatic disease, similar diffuse sclerotic changes in the skeleton 12.  CT renal stone study 01/14/2022-mild left side hydroureteronephrosis secondary to a left UVJ stone, left nephrolithiasis, new right middle lobe collapse/consolidation, edema/fluid at the left iliopsoas muscle 13.  PE noted on CT 05/05/2022-on apixaban.  He saw Dr. Isaiah Serge at [facility] 05/16/2022; instructions are to hold apixaban 72 hours prior to scheduled surgery and start Lovenox at prophylactic dosing.    Disposition: Brandon Peters appears stable.  He will continue abiraterone/prednisone and leuprolide for treatment of metastatic prostate cancer.  We will follow-up on the PSA from today. He has anemia secondary to prostate cancer, chronic disease, and renal failure.  The hemoglobin is stable.  He will return for leuprolide in 1 month and an office visit in 2 months.  Thornton Papas, MD  12/07/2023  9:11 AM

## 2023-12-08 LAB — PROSTATE-SPECIFIC AG, SERUM (LABCORP): Prostate Specific Ag, Serum: 1.5 ng/mL (ref 0.0–4.0)

## 2023-12-26 ENCOUNTER — Other Ambulatory Visit: Payer: Self-pay

## 2023-12-26 NOTE — Progress Notes (Signed)
 Specialty Pharmacy Refill Coordination Note  Brandon Peters is a 68 y.o. male contacted today regarding refills of specialty medication(s) Abiraterone Acetate [name])   Patient requested Pickup at [facility] at [place] date: 01/01/24   Medication will be filled on 12/29/23.

## 2023-12-29 ENCOUNTER — Other Ambulatory Visit: Payer: Self-pay

## 2024-01-01 ENCOUNTER — Ambulatory Visit: Payer: Medicare Other | Admitting: Podiatry

## 2024-01-03 ENCOUNTER — Ambulatory Visit: Payer: Medicare Other | Admitting: Podiatry

## 2024-01-03 ENCOUNTER — Encounter: Payer: Self-pay | Admitting: Podiatry

## 2024-01-03 DIAGNOSIS — L84 Corns and callosities: Secondary | ICD-10-CM

## 2024-01-03 DIAGNOSIS — B351 Tinea unguium: Secondary | ICD-10-CM | POA: Diagnosis not present

## 2024-01-03 DIAGNOSIS — M79674 Pain in right toe(s): Secondary | ICD-10-CM

## 2024-01-03 DIAGNOSIS — E1149 Type 2 diabetes mellitus with other diabetic neurological complication: Secondary | ICD-10-CM

## 2024-01-03 DIAGNOSIS — M79675 Pain in left toe(s): Secondary | ICD-10-CM

## 2024-01-03 DIAGNOSIS — D689 Coagulation defect, unspecified: Secondary | ICD-10-CM | POA: Diagnosis not present

## 2024-01-03 DIAGNOSIS — E1140 Type 2 diabetes mellitus with diabetic neuropathy, unspecified: Secondary | ICD-10-CM

## 2024-01-04 ENCOUNTER — Inpatient Hospital Stay: Attending: Oncology

## 2024-01-04 VITALS — BP 133/69 | HR 73 | Temp 98.5°F | Resp 16 | Ht 65.0 in | Wt 155.3 lb

## 2024-01-04 DIAGNOSIS — Z5111 Encounter for antineoplastic chemotherapy: Secondary | ICD-10-CM | POA: Insufficient documentation

## 2024-01-04 DIAGNOSIS — C7951 Secondary malignant neoplasm of bone: Secondary | ICD-10-CM | POA: Insufficient documentation

## 2024-01-04 DIAGNOSIS — Z79899 Other long term (current) drug therapy: Secondary | ICD-10-CM | POA: Insufficient documentation

## 2024-01-04 DIAGNOSIS — C61 Malignant neoplasm of prostate: Secondary | ICD-10-CM | POA: Insufficient documentation

## 2024-01-04 MED ORDER — LEUPROLIDE ACETATE (4 MONTH) 30 MG ~~LOC~~ KIT
30.0000 mg | PACK | Freq: Once | SUBCUTANEOUS | Status: AC
  Administered 2024-01-04: 30 mg via SUBCUTANEOUS
  Filled 2024-01-04: qty 30

## 2024-01-04 NOTE — Progress Notes (Signed)
 Subjective:   Patient ID: Brandon Peters, male   DOB: 68 y.o.   MRN: [id number]   HPI Patient presents with caregiver with chronic lesion fifth metatarsal head both feet that are painful elongated thickened nailbeds 1-5 both feet that are incurvated and can become painful to the patient   ROS      Objective:  Physical Exam  Neurovascular status unchanged with patient on blood thinner and also does have diabetes with diminishment of sharp dull vibratory.  Thick keratotic lesions subfifth metatarsal head both feet painful when pressed and elongated nailbeds 1-5 both feet that are incurvated and sore     Assessment:  Chronic keratotic lesion formation fifth bilateral with high risk factors and mycotic nail infection with pain 1-5 both feet     Plan:  Debridement of nailbeds 1-5 both feet no iatrogenic bleeding careful sharp debridement of lesions fifth metatarsal head bilateral no iatrogenic bleeding reappoint routine care discussed diabetes and continued efforts to keep it under control and did discuss daily foot inspections

## 2024-01-25 ENCOUNTER — Other Ambulatory Visit: Payer: Self-pay

## 2024-01-25 NOTE — Progress Notes (Signed)
 Specialty Pharmacy Refill Coordination Note  Brandon Peters is a 68 y.o. male contacted today regarding refills of specialty medication(s) Abiraterone  Acetate (ZYTIGA )   Patient requested Pickup at [facility] at [place] date: 01/31/24   Medication will be filled on 01/30/24.

## 2024-01-30 ENCOUNTER — Other Ambulatory Visit (HOSPITAL_COMMUNITY): Payer: Self-pay

## 2024-02-01 ENCOUNTER — Other Ambulatory Visit: Payer: Self-pay

## 2024-02-01 NOTE — Progress Notes (Signed)
 Clinical Intervention Note  Clinical Intervention Notes: Patient report starting Torsemide. No DDIs identified with Zytiga .   Clinical Intervention Outcomes: Prevention of an adverse drug event   Rena Carnes Specialty Pharmacist

## 2024-02-06 ENCOUNTER — Ambulatory Visit (HOSPITAL_BASED_OUTPATIENT_CLINIC_OR_DEPARTMENT_OTHER)
Admission: RE | Admit: 2024-02-06 | Discharge: 2024-02-06 | Disposition: A | Discharge status: Home / Self Care | Source: Ambulatory Visit | Attending: Oncology | Admitting: Oncology

## 2024-02-06 ENCOUNTER — Inpatient Hospital Stay: Admitting: Oncology

## 2024-02-06 ENCOUNTER — Ambulatory Visit: Payer: Self-pay | Admitting: Oncology

## 2024-02-06 ENCOUNTER — Inpatient Hospital Stay: Attending: Oncology

## 2024-02-06 VITALS — BP 122/70 | HR 94 | Temp 98.2°F | Resp 18 | Wt 149.4 lb

## 2024-02-06 DIAGNOSIS — D649 Anemia, unspecified: Secondary | ICD-10-CM | POA: Diagnosis not present

## 2024-02-06 DIAGNOSIS — C651 Malignant neoplasm of right renal pelvis: Secondary | ICD-10-CM | POA: Diagnosis not present

## 2024-02-06 DIAGNOSIS — Z7901 Long term (current) use of anticoagulants: Secondary | ICD-10-CM | POA: Diagnosis not present

## 2024-02-06 DIAGNOSIS — C7951 Secondary malignant neoplasm of bone: Secondary | ICD-10-CM | POA: Insufficient documentation

## 2024-02-06 DIAGNOSIS — Z85068 Personal history of other malignant neoplasm of small intestine: Secondary | ICD-10-CM | POA: Diagnosis not present

## 2024-02-06 DIAGNOSIS — Z905 Acquired absence of kidney: Secondary | ICD-10-CM | POA: Diagnosis not present

## 2024-02-06 DIAGNOSIS — C786 Secondary malignant neoplasm of retroperitoneum and peritoneum: Secondary | ICD-10-CM | POA: Diagnosis not present

## 2024-02-06 DIAGNOSIS — N19 Unspecified kidney failure: Secondary | ICD-10-CM | POA: Insufficient documentation

## 2024-02-06 DIAGNOSIS — Z7952 Long term (current) use of systemic steroids: Secondary | ICD-10-CM | POA: Insufficient documentation

## 2024-02-06 DIAGNOSIS — Q5320 Undescended testicle, unspecified, bilateral: Secondary | ICD-10-CM | POA: Insufficient documentation

## 2024-02-06 DIAGNOSIS — E876 Hypokalemia: Secondary | ICD-10-CM | POA: Diagnosis not present

## 2024-02-06 DIAGNOSIS — R059 Cough, unspecified: Secondary | ICD-10-CM | POA: Insufficient documentation

## 2024-02-06 DIAGNOSIS — E119 Type 2 diabetes mellitus without complications: Secondary | ICD-10-CM | POA: Diagnosis not present

## 2024-02-06 DIAGNOSIS — R972 Elevated prostate specific antigen [PSA]: Secondary | ICD-10-CM | POA: Insufficient documentation

## 2024-02-06 DIAGNOSIS — C641 Malignant neoplasm of right kidney, except renal pelvis: Secondary | ICD-10-CM | POA: Insufficient documentation

## 2024-02-06 DIAGNOSIS — C61 Malignant neoplasm of prostate: Secondary | ICD-10-CM | POA: Insufficient documentation

## 2024-02-06 DIAGNOSIS — Z79899 Other long term (current) drug therapy: Secondary | ICD-10-CM | POA: Insufficient documentation

## 2024-02-06 LAB — CBC WITH DIFFERENTIAL (CANCER CENTER ONLY)
Abs Immature Granulocytes: 0.07 10*3/uL (ref 0.00–0.07)
Basophils Absolute: 0 10*3/uL (ref 0.0–0.1)
Basophils Relative: 0 %
Eosinophils Absolute: 0.1 10*3/uL (ref 0.0–0.5)
Eosinophils Relative: 1 %
HCT: 26.3 % — ABNORMAL LOW (ref 39.0–52.0)
Hemoglobin: 8.1 g/dL — ABNORMAL LOW (ref 13.0–17.0)
Immature Granulocytes: 1 %
Lymphocytes Relative: 6 %
Lymphs Abs: 0.5 10*3/uL — ABNORMAL LOW (ref 0.7–4.0)
MCH: 29.6 pg (ref 26.0–34.0)
MCHC: 30.8 g/dL (ref 30.0–36.0)
MCV: 96 fL (ref 80.0–100.0)
Monocytes Absolute: 0.7 10*3/uL (ref 0.1–1.0)
Monocytes Relative: 7 %
Neutro Abs: 7.9 10*3/uL — ABNORMAL HIGH (ref 1.7–7.7)
Neutrophils Relative %: 85 %
Platelet Count: 253 10*3/uL (ref 150–400)
RBC: 2.74 MIL/uL — ABNORMAL LOW (ref 4.22–5.81)
RDW: 13.8 % (ref 11.5–15.5)
WBC Count: 9.3 10*3/uL (ref 4.0–10.5)
nRBC: 0 % (ref 0.0–0.2)

## 2024-02-06 LAB — CMP (CANCER CENTER ONLY)
ALT: 17 U/L (ref 0–44)
AST: 23 U/L (ref 15–41)
Albumin: 3.8 g/dL (ref 3.5–5.0)
Alkaline Phosphatase: 83 U/L (ref 38–126)
Anion gap: 17 — ABNORMAL HIGH (ref 5–15)
BUN: 52 mg/dL — ABNORMAL HIGH (ref 8–23)
CO2: 22 mmol/L (ref 22–32)
Calcium: 9.6 mg/dL (ref 8.9–10.3)
Chloride: 97 mmol/L — ABNORMAL LOW (ref 98–111)
Creatinine: 3.55 mg/dL — ABNORMAL HIGH (ref 0.61–1.24)
GFR, Estimated: 18 mL/min — ABNORMAL LOW (ref >60)
Glucose, Bld: 334 mg/dL — ABNORMAL HIGH (ref 70–99)
Potassium: 3 mmol/L — ABNORMAL LOW (ref 3.5–5.1)
Sodium: 137 mmol/L (ref 135–145)
Total Bilirubin: 0.5 mg/dL (ref 0.0–1.2)
Total Protein: 6.8 g/dL (ref 6.5–8.1)

## 2024-02-06 NOTE — Progress Notes (Signed)
 [facility] OFFICE PROGRESS NOTE   Diagnosis: Prostate cancer, renal cell carcinoma  INTERVAL HISTORY:   Brandon Peters returns as scheduled.  He continues abiraterone /prednisone .  He feels well.  Good appetite.  He reports a nonproductive cough for the past week.  No dyspnea.  Objective:  Vital signs in last 24 hours:  Blood pressure 122/70, pulse 94, temperature 98.2 F (36.8 C), temperature source Temporal, resp. rate 18, weight 149 lb 6.4 oz (67.8 kg), SpO2 99%.     Lymphatics: No cervical, supraclavicular, axillary, or inguinal nodes Resp: Mild bilateral expiratory wheeze, good air movement bilaterally, no respiratory distress Cardio: Regular rate and rhythm GI: No hepatosplenomegaly Vascular: Trace-1+ pitting edema at the left greater than right lower leg     Lab Results:  Lab Results  Component Value Date   WBC 9.3 02/06/2024   HGB 8.1 (L) 02/06/2024   HCT 26.3 (L) 02/06/2024   MCV 96.0 02/06/2024   PLT 253 02/06/2024   NEUTROABS 7.9 (H) 02/06/2024    CMP  Lab Results  Component Value Date   NA 137 02/06/2024   K 3.0 (L) 02/06/2024   CL 97 (L) 02/06/2024   CO2 22 02/06/2024   GLUCOSE 334 (H) 02/06/2024   BUN 52 (H) 02/06/2024   CREATININE 3.55 (H) 02/06/2024   CALCIUM  9.6 02/06/2024   PROT 6.8 02/06/2024   ALBUMIN 3.8 02/06/2024   AST 23 02/06/2024   ALT 17 02/06/2024   ALKPHOS 83 02/06/2024   BILITOT 0.5 02/06/2024   GFRNONAA 18 (L) 02/06/2024   GFRAA >60 03/05/2020    Lab Results  Component Value Date   CEA1 3.31 05/03/2019    Lab Results  Component Value Date   INR 1.0 10/13/2021   LABPROT 12.9 10/13/2021    Imaging:  No results found.  Medications: I have reviewed the patient's current medications.   Assessment/Plan: History of Severe anemia-status post a red cell transfusion 03/04/2016 2.. Prostate cancer Extensive sclerotic bone metastases noted on a CT of the abdomen/pelvis 03/04/2016 and MRI abdomen  03/05/2016 Markedly elevated PSA Lupron  initiated 03/15/2016; Casodex  14 days beginning 03/15/2016 Prostate biopsy 03/18/2016 05/09/2016 PSA significantly improved at 21 Abiraterone  initiated 05/10/2016 PSA less than 0.1 07/10/2018 PSA less than 0.1 08/30/2018 PSA less than 0.1 10/18/2018 PSA less than 0.1 11/29/2018 PSA less than 0.1 on 11/06/2019  PSA less than 0.1 on 03/05/2020 PSA less than 0.1 on 07/06/2020 PSA less than 0.1 08/17/2021 PSA less than 0.1 on 09/30/2021 PSA 0.1 on 11/12/2021 PSA 0.1 on 02/18/2022 PSA 0.2 on 12/09/2022 PSA 0.3 on 04/04/2023  PSA 0.4 on 06/06/2023 PSA 0.5 on 07/05/2023 PSA 0.7 on 08/09/2023     3.   Symptoms of obstructive uropathy. Improved   4.   Distal duodenal and descending colon masses biopsy of the duodenal mass 03/04/2016 confirmed fragments of small bowel mucosa with focal high-grade dysplasia Biopsy of the descending colon "polyps" revealed fragments of a tubulovillous adenoma CT 08/29/2017-similar soft tissue thickening in the region of the splenic flexure of the colon suboptimally evaluated due to lack of colonic enteric contrast Referred to Dr. Nickey Peters Referred to [facility] Resection of descending colon polyps 01/31/2018, tubulovillous adenomas with high-grade dysplasia Attempted endoscopic resection of the fourth segment duodenal "polyp" on 05/09/2018, firm unresectable lesion, biopsy revealed moderately differentiated adenocarcinoma CT abdomen/pelvis 05/25/2018- heterogeneously enhancing right lower pole renal lesion; diffuse sclerotic osseous metastatic lesions compatible with history of prostate cancer CT chest 05/25/2018- no pulmonary metastasis.  Diffuse osseous metastasis involving  the axial and appendicular skeleton. 06/19/2018-sleeve resection of third and fourth portions of duodenum with primary end-to-end anastomosis, feeding gastrojejunostomy tube placement, [facility]; poorly differentiated adenocarcinoma duodenum, 3 cm; tumor invades the muscularis  propria; lymphovascular invasion present; negative margins; 4 of 11 involved lymph nodes; soft tissue deposits also seen in the mesentery; all mucosal margins and soft tissue margins negative but 2 of the positive nodes are found in the soft tissue margin submitted for frozen section (pT2 pN2); second mucosal lesion 1.2 cm from the proximal margin, tubular adenoma with focal severe dysplasia Intact expression of mismatch repair proteins by IHC; microsatellite stable Cycle 1 adjuvant Xeloda  07/23/2018 Cycle 2 adjuvant Xeloda  08/13/2018 Cycle 3 adjuvant Xeloda  09/10/2018 (dose reduced to 1000 mg twice daily for 14 days)  Cycle 4 adjuvant Xeloda  10/01/2018 (dose increased to 1500 mg every morning and 1000 mg every afternoon for 14 days) Cycle 5 adjuvant Xeloda  10/22/2018 Cycle 6 adjuvant Xeloda  11/12/2018 Cycle 7 adjuvant Xeloda  12/03/2018 Cycle 8 adjuvant Xeloda  12/24/2018   5.  Anorexia/weight loss- resolved   6.  Undescended testicles   7.  Right renal mass.  CT 08/29/2017-right renal mass involving the lower pole was partially calcified and does demonstrate enhancement following contrast most consistent with renal cell carcinoma.  Lesion not significantly enlarged compared with the prior CT. Followed by urology. 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 to the IVC/RA junction, upper normal size retrocaval node 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, nuclear grade  3 Axitinib /pembrolizumab  10/22/2021 Axitinib  placed on hold 11/05/2021 due to hand-foot syndrome Axitinib  resumed at a dose of 5 mg daily 11/12/2021, pembrolizumab  11/12/2021 Axitinib  discontinued 12/03/2021 secondary to progressive hand-foot syndrome, pembrolizumab  12/03/2021 Axitinib  resumed at a reduced dose of 2 mg twice daily after office visit 12/14/2021 Pembrolizumab  12/24/2021 Axitinib  stopped 01/03/2022 secondary to persistent hand/foot symptoms CT chest at [facility] 01/13/2022-no evidence of thoracic metastatic disease, similar diffuse sclerotic change in the skeleton MRI abdomen at [facility] 01/13/2022-similar size of right lower pole renal mass with renal vein thrombosis, decreased overall extent of thrombus now extending to the intrahepatic IVC, enhancement of right renal mass and thrombus is decreased, new left hydroureter ureter nephrosis with asymmetric perinephric stranding Pembrolizumab  01/27/2022 Axitinib  resumed 2 mg daily 01/28/2022 CT abdomen/pelvis at [facility] 02/10/2022-overall reduced size of right lower pole renal mass which demonstrates persistent region of enhancement along the posterior and lateral margin with extension into the renal sinus.  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  Query additional IVC thrombus at the intrahepatic IVC and extending into the right atrium. Axitinib  continued 2 mg daily, Pembrolizumab  every 3 weeks CT 05/05/2022 at [facility]-partially visualized pulmonary embolism involving segmental arteries of the right lower lobe.  Interval slight decrease in size of the mass arising from the lower pole the right kidney.  Mass demonstrates persistent moderate necrosis centrally.  Increased enhancing soft tissue component measuring up to 2 cm which is mildly increased in size compared to prior from 02/10/2022.  Bland tumor thrombus identified in the IVC at the level  of the right renal vein ostium extending into the suprarenal IVC superiorly.  No obvious thrombus  identified in the infrarenal, intrahepatic and suprahepatic IVC.  Likely trace bland thrombus in the anterior right renal vein.  Stable several subcentimeter retroperitoneal lymph nodes.  Diffuse osseous sclerotic and lytic metastatic disease stable compared to prior. Axitinib  placed on hold 05/04/2022 due to pain bilateral feet; 05/13/2022 he will continue to hold axitinib  Pembrolizumab  05/13/2022 Pembrolizumab  06/03/2022 Robotic right radical nephrectomy and IVC thrombectomy 06/27/2022-right kidney with tumor necrosis/hemorrhage,ypT0, adherent thrombus with cellular necrosis and no viable carcinoma present at the renal vein margin, other margins negative for carcinoma, 0/3 lymph nodes.  6.5 cm tumor including intraparenchymal lesion and thrombus in the renal vein 8.  Diabetes   9.  Anemia-stool Hemoccults + March 2021, referred to Dr. Nickey Peters for endoscopic evaluation, anemia also secondary to metastatic prostate cancer, hypogonadism Small bowel enteroscopy 02/11/2020-esophagus normal.  Hematin found in the gastric body.  Examined duodenum normal.  No evidence of significant pathology in the entire examined portion of the jejunum.  There was no overt source of bleeding or inflammation.  The bleeding may be from small AVMs.  Surgical anastomosis was not visible with repeated inspection of the duodenum. Colonoscopy 02/11/2020-multiple sessile and semipedunculated polyps were found in the rectum, descending colon and ascending colon, 3 to 18 mm in size.  Polyps removed (polypectomy descending colon-fragments of tubular adenoma, inflammatory type polyp; polypectomy ascending colon-tubular adenoma, no high-grade dysplasia or malignancy; polypectomy descending colon-fragments of tubulovillous adenoma, no high-grade dysplasia or malignancy; polypectomy colon, rectum-tubular adenoma, no high-grade dysplasia or malignancy). Colonoscopy 02/02/2021-seven 2 to 10 mm polyps in the sigmoid colon (tubular adenoma), descending colon  (fragments of tubulovillous adenoma, fragments of tubular adenoma ) and transverse colon (fragments of tubular adenoma). Progressive anemia 07/05/2021; stool cards positive Upper endoscopy/colonoscopy 09/23/2021-multiple polyps removed from the colon-tubular adenomas, diffuse angioectasias in the stomach with bleeding-not amenable to endoscopic treatment 10.  Renal failure-progressive 01/17/2022, progressive following right nephrectomy October 2023 11.  CT chest at [facility] 01/13/2022-no evidence of thoracic metastatic disease, similar diffuse sclerotic changes in the skeleton 12.  CT renal stone study 01/14/2022-mild left side hydroureteronephrosis secondary to a left UVJ stone, left nephrolithiasis, new right middle lobe collapse/consolidation, edema/fluid at the left iliopsoas muscle 13.  PE noted on CT 05/05/2022-on apixaban .  He saw [facility] at [facility] 05/16/2022; instructions are to hold apixaban  72 hours prior to scheduled surgery and start Lovenox at prophylactic dosing.      Disposition: Brandon Peters has a complex medical history.  He has metastatic prostate cancer.  The PSA is rising in the normal range.  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  He has progressive anemia, likely related to renal failure.  Metastatic prostate cancer may be contributing.  He denies bleeding, but subclinical bleeding while on apixaban  could also be contributing to the anemia.  He has a nonproductive cough.  He will be referred for a chest x-ray today.  [facility] is followed by [facility] for renal failure.  He was started on torsemide 01/16/2024.  We will forward results of today's chemistry panel to BrandonManivannan to decide on continuing torsemide and management of hypokalemia.  Brandon. Novick will return for an office visit in 2 weeks.  Coni Deep, MD  02/06/2024  12:38 PM

## 2024-02-07 ENCOUNTER — Other Ambulatory Visit: Payer: Self-pay

## 2024-02-07 ENCOUNTER — Ambulatory Visit: Payer: Self-pay | Admitting: Oncology

## 2024-02-07 ENCOUNTER — Encounter: Payer: Self-pay | Admitting: Oncology

## 2024-02-07 ENCOUNTER — Telehealth: Payer: Self-pay | Admitting: Nurse Practitioner

## 2024-02-07 DIAGNOSIS — C641 Malignant neoplasm of right kidney, except renal pelvis: Secondary | ICD-10-CM

## 2024-02-07 DIAGNOSIS — D124 Benign neoplasm of descending colon: Secondary | ICD-10-CM

## 2024-02-07 DIAGNOSIS — C61 Malignant neoplasm of prostate: Secondary | ICD-10-CM

## 2024-02-07 LAB — PROSTATE-SPECIFIC AG, SERUM (LABCORP): Prostate Specific Ag, Serum: 4.7 ng/mL — ABNORMAL HIGH (ref 0.0–4.0)

## 2024-02-07 NOTE — Telephone Encounter (Signed)
 Patient gave verbal understanding and had no further questions or concerns

## 2024-02-07 NOTE — Telephone Encounter (Signed)
-----   Message from Coni Deep sent at 02/06/2024  8:10 PM EDT ----- Please call patient, chest x-ray shows no obvious pneumonia, but there are increased patchy opacity in the lungs which could indicate an unusual type of pneumonia or this could be an artifact from overlying cancer in the ribs (has known prostate cancer involving the bones) I recommend a noncontrast chest CT Call for increased cough or development of shortness of breath

## 2024-02-07 NOTE — Telephone Encounter (Signed)
 Patient has been scheduled for follow-up visit per 02/01/24 LOS.  LVM notifying pt of appt details, provided my direct number to pt if appt changes need to be made.

## 2024-02-07 NOTE — Addendum Note (Signed)
 Addended by: Hadriel Northup S on: 02/07/2024 03:08 PM   Modules accepted: Orders

## 2024-02-07 NOTE — Telephone Encounter (Signed)
-----   Message from Coni Deep sent at 02/07/2024  1:47 PM EDT ----- Add PSA next visit

## 2024-02-16 ENCOUNTER — Ambulatory Visit (HOSPITAL_BASED_OUTPATIENT_CLINIC_OR_DEPARTMENT_OTHER)

## 2024-02-22 ENCOUNTER — Other Ambulatory Visit: Payer: Self-pay

## 2024-02-22 ENCOUNTER — Inpatient Hospital Stay: Admitting: Nurse Practitioner

## 2024-02-22 ENCOUNTER — Encounter: Payer: Self-pay | Admitting: Nurse Practitioner

## 2024-02-22 ENCOUNTER — Inpatient Hospital Stay
Admission: RE | Admit: 2024-02-22 | Discharge: 2024-02-22 | Disposition: A | Discharge status: Home / Self Care | Payer: Self-pay | Source: Ambulatory Visit | Attending: Nurse Practitioner | Admitting: Nurse Practitioner

## 2024-02-22 ENCOUNTER — Inpatient Hospital Stay

## 2024-02-22 VITALS — BP 127/66 | HR 96 | Temp 97.8°F | Resp 18 | Ht 65.0 in

## 2024-02-22 DIAGNOSIS — C641 Malignant neoplasm of right kidney, except renal pelvis: Secondary | ICD-10-CM

## 2024-02-22 DIAGNOSIS — C179 Malignant neoplasm of small intestine, unspecified: Secondary | ICD-10-CM

## 2024-02-22 DIAGNOSIS — C61 Malignant neoplasm of prostate: Secondary | ICD-10-CM | POA: Diagnosis not present

## 2024-02-22 DIAGNOSIS — D649 Anemia, unspecified: Secondary | ICD-10-CM

## 2024-02-22 LAB — SAMPLE TO BLOOD BANK

## 2024-02-22 LAB — CBC WITH DIFFERENTIAL (CANCER CENTER ONLY)
Abs Immature Granulocytes: 0.05 10*3/uL (ref 0.00–0.07)
Basophils Absolute: 0 10*3/uL (ref 0.0–0.1)
Basophils Relative: 0 %
Eosinophils Absolute: 0.1 10*3/uL (ref 0.0–0.5)
Eosinophils Relative: 1 %
HCT: 28.1 % — ABNORMAL LOW (ref 39.0–52.0)
Hemoglobin: 8.2 g/dL — ABNORMAL LOW (ref 13.0–17.0)
Immature Granulocytes: 1 %
Lymphocytes Relative: 6 %
Lymphs Abs: 0.4 10*3/uL — ABNORMAL LOW (ref 0.7–4.0)
MCH: 28.6 pg (ref 26.0–34.0)
MCHC: 29.2 g/dL — ABNORMAL LOW (ref 30.0–36.0)
MCV: 97.9 fL (ref 80.0–100.0)
Monocytes Absolute: 0.4 10*3/uL (ref 0.1–1.0)
Monocytes Relative: 6 %
Neutro Abs: 6.1 10*3/uL (ref 1.7–7.7)
Neutrophils Relative %: 86 %
Platelet Count: 241 10*3/uL (ref 150–400)
RBC: 2.87 MIL/uL — ABNORMAL LOW (ref 4.22–5.81)
RDW: 15.7 % — ABNORMAL HIGH (ref 11.5–15.5)
WBC Count: 7.1 10*3/uL (ref 4.0–10.5)
nRBC: 0 % (ref 0.0–0.2)

## 2024-02-22 LAB — CMP (CANCER CENTER ONLY)
ALT: 36 U/L (ref 0–44)
AST: 42 U/L — ABNORMAL HIGH (ref 15–41)
Albumin: 3.7 g/dL (ref 3.5–5.0)
Alkaline Phosphatase: 71 U/L (ref 38–126)
Anion gap: 16 — ABNORMAL HIGH (ref 5–15)
BUN: 45 mg/dL — ABNORMAL HIGH (ref 8–23)
CO2: 18 mmol/L — ABNORMAL LOW (ref 22–32)
Calcium: 9.6 mg/dL (ref 8.9–10.3)
Chloride: 106 mmol/L (ref 98–111)
Creatinine: 2.63 mg/dL — ABNORMAL HIGH (ref 0.61–1.24)
GFR, Estimated: 26 mL/min — ABNORMAL LOW (ref >60)
Glucose, Bld: 172 mg/dL — ABNORMAL HIGH (ref 70–99)
Potassium: 3.4 mmol/L — ABNORMAL LOW (ref 3.5–5.1)
Sodium: 139 mmol/L (ref 135–145)
Total Bilirubin: 0.4 mg/dL (ref 0.0–1.2)
Total Protein: 7 g/dL (ref 6.5–8.1)

## 2024-02-22 LAB — MAGNESIUM: Magnesium: 2 mg/dL (ref 1.7–2.4)

## 2024-02-22 LAB — FERRITIN: Ferritin: 945 ng/mL — ABNORMAL HIGH (ref 24–336)

## 2024-02-22 LAB — CEA (ACCESS): CEA (CHCC): 5.86 ng/mL — ABNORMAL HIGH (ref 0.00–5.00)

## 2024-02-22 NOTE — Progress Notes (Signed)
 [facility] OFFICE PROGRESS NOTE   Diagnosis: Prostate cancer, renal cell carcinoma  INTERVAL HISTORY:   [name] returns as scheduled.  He continues abiraterone  and prednisone .  He was hospitalized at [facility] 02/13/2024 to 02/17/2024 with worsening renal function.  There was improvement with IV hydration.  CT scans were completed during the hospitalization showing worsening of metastatic disease within the abdomen and pelvis, now with new periaortic lymphadenopathy and peritoneal and omental carcinomatosis; small amount of ascites; similar-appearing diffuse bony metastatic disease; focal thickening of the gastric body.  He is fatigued.  He denies bleeding.  No pain.  He has a good appetite.  No shortness of breath.  Persistent cough.  No fever.  No rash.  Objective:  Vital signs in last 24 hours:  Blood pressure 127/66, pulse 96, temperature 97.8 F (36.6 C), temperature source Temporal, resp. rate 18, height 5\' 5"  (1.651 m), SpO2 100%.    HEENT: No thrush or ulcers. Lymphatics: No palpable cervical, supraclavicular, axillary or inguinal lymph nodes. Resp: Distant breath sounds.  No respiratory distress. Cardio: Regular rate and rhythm. GI: Abdomen appears distended.  No apparent ascites.  Mild tenderness left upper abdomen.  No hepatosplenomegaly.  No mass. Vascular: Pitting edema lower leg bilaterally.    Lab Results:  Lab Results  Component Value Date   WBC 7.1 02/22/2024   HGB 8.2 (L) 02/22/2024   HCT 28.1 (L) 02/22/2024   MCV 97.9 02/22/2024   PLT 241 02/22/2024   NEUTROABS 6.1 02/22/2024    Imaging:  No results found.  Medications: I have reviewed the patient's current medications.  Assessment/Plan: History of Severe anemia-status post a red cell transfusion 03/04/2016 2.. Prostate cancer Extensive sclerotic bone metastases noted on a CT of the abdomen/pelvis 03/04/2016 and MRI abdomen 03/05/2016 Markedly elevated PSA Lupron  initiated 03/15/2016;  Casodex  14 days beginning 03/15/2016 Prostate biopsy 03/18/2016 05/09/2016 PSA significantly improved at 21 Abiraterone  initiated 05/10/2016 PSA less than 0.1 07/10/2018 PSA less than 0.1 08/30/2018 PSA less than 0.1 10/18/2018 PSA less than 0.1 11/29/2018 PSA less than 0.1 on 11/06/2019  PSA less than 0.1 on 03/05/2020 PSA less than 0.1 on 07/06/2020 PSA less than 0.1 08/17/2021 PSA less than 0.1 on 09/30/2021 PSA 0.1 on 11/12/2021 PSA 0.1 on 02/18/2022 PSA 0.2 on 12/09/2022 PSA 0.3 on 04/04/2023  PSA 0.4 on 06/06/2023 PSA 0.5 on 07/05/2023 PSA 0.7 on 08/09/2023     3.   Symptoms of obstructive uropathy. Improved   4.   Distal duodenal and descending colon masses biopsy of the duodenal mass 03/04/2016 confirmed fragments of small bowel mucosa with focal high-grade dysplasia Biopsy of the descending colon "polyps" revealed fragments of a tubulovillous adenoma CT 08/29/2017-similar soft tissue thickening in the region of the splenic flexure of the colon suboptimally evaluated due to lack of colonic enteric contrast Referred to Dr. Nickey Barn Referred to [facility] Resection of descending colon polyps 01/31/2018, tubulovillous adenomas with high-grade dysplasia Attempted endoscopic resection of the fourth segment duodenal "polyp" on 05/09/2018, firm unresectable lesion, biopsy revealed moderately differentiated adenocarcinoma CT abdomen/pelvis 05/25/2018- heterogeneously enhancing right lower pole renal lesion; diffuse sclerotic osseous metastatic lesions compatible with history of prostate cancer CT chest 05/25/2018- no pulmonary metastasis.  Diffuse osseous metastasis involving the axial and appendicular skeleton. 06/19/2018-sleeve resection of third and fourth portions of duodenum with primary end-to-end anastomosis, feeding gastrojejunostomy tube placement, Dr. Elray Hall; poorly differentiated adenocarcinoma duodenum, 3 cm; tumor invades the muscularis propria; lymphovascular invasion present; negative margins; 4 of  11 involved lymph  nodes; soft tissue deposits also seen in the mesentery; all mucosal margins and soft tissue margins negative but 2 of the positive nodes are found in the soft tissue margin submitted for frozen section (pT2 pN2); second mucosal lesion 1.2 cm from the proximal margin, tubular adenoma with focal severe dysplasia Intact expression of mismatch repair proteins by IHC; microsatellite stable Cycle 1 adjuvant Xeloda  07/23/2018 Cycle 2 adjuvant Xeloda  08/13/2018 Cycle 3 adjuvant Xeloda  09/10/2018 (dose reduced to 1000 mg twice daily for 14 days)  Cycle 4 adjuvant Xeloda  10/01/2018 (dose increased to 1500 mg every morning and 1000 mg every afternoon for 14 days) Cycle 5 adjuvant Xeloda  10/22/2018 Cycle 6 adjuvant Xeloda  11/12/2018 Cycle 7 adjuvant Xeloda  12/03/2018 Cycle 8 adjuvant Xeloda  12/24/2018   5.  Anorexia/weight loss- resolved   6.  Undescended testicles   7.  Right renal mass.  CT 08/29/2017-right renal mass involving the lower pole was partially calcified and does demonstrate enhancement following contrast most consistent with renal cell carcinoma.  Lesion not significantly enlarged compared with the prior CT. Followed by urology. 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 to the IVC/RA junction, upper normal size retrocaval node 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, nuclear grade 3 Axitinib /pembrolizumab  10/22/2021 Axitinib  placed on hold  11/05/2021 due to hand-foot syndrome Axitinib  resumed at a dose of 5 mg daily 11/12/2021, pembrolizumab  11/12/2021 Axitinib  discontinued 12/03/2021 secondary to progressive hand-foot syndrome, pembrolizumab  12/03/2021 Axitinib  resumed at a reduced dose of 2 mg twice daily after office visit 12/14/2021 Pembrolizumab  12/24/2021 Axitinib  stopped 01/03/2022 secondary to persistent hand/foot symptoms CT chest at [facility] 01/13/2022-no evidence of thoracic metastatic disease, similar diffuse sclerotic change in the skeleton MRI abdomen at [facility] 01/13/2022-similar size of right lower pole renal mass with renal vein thrombosis, decreased overall extent of thrombus now extending to the intrahepatic IVC, enhancement of right renal mass and thrombus is decreased, new left hydroureter ureter nephrosis with asymmetric perinephric stranding Pembrolizumab  01/27/2022 Axitinib  resumed 2 mg daily 01/28/2022 CT abdomen/pelvis at [facility] 02/10/2022-overall reduced size of right lower pole renal mass which demonstrates persistent region of enhancement along the posterior and lateral margin with extension into the renal sinus.  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  Query additional IVC thrombus at the intrahepatic IVC and extending into the right atrium. Axitinib  continued 2 mg daily, Pembrolizumab  every 3 weeks CT 05/05/2022 at [facility]-partially visualized pulmonary embolism involving segmental arteries of the right lower lobe.  Interval slight decrease in size of the mass arising from the lower pole the right kidney.  Mass demonstrates persistent moderate necrosis centrally.  Increased enhancing soft tissue component measuring up to 2 cm which is mildly increased in size compared to prior from 02/10/2022.  Bland tumor thrombus identified in the IVC at the level of the right renal vein ostium extending into the suprarenal IVC superiorly.  No obvious thrombus identified in the infrarenal, intrahepatic and suprahepatic IVC.   Likely trace bland thrombus in the anterior right renal vein.  Stable several subcentimeter retroperitoneal lymph nodes.  Diffuse osseous  sclerotic and lytic metastatic disease stable compared to prior. Axitinib  placed on hold 05/04/2022 due to pain bilateral feet; 05/13/2022 he will continue to hold axitinib  Pembrolizumab  05/13/2022 Pembrolizumab  06/03/2022 Robotic right radical nephrectomy and IVC thrombectomy 06/27/2022-right kidney with tumor necrosis/hemorrhage,ypT0, adherent thrombus with cellular necrosis and no viable carcinoma present at the renal vein margin, other margins negative for carcinoma, 0/3 lymph nodes.  6.5 cm tumor including intraparenchymal lesion and thrombus in the renal vein 8.  Diabetes   9.  Anemia-stool Hemoccults + March 2021, referred to Dr. Nickey Barn for endoscopic evaluation, anemia also secondary to metastatic prostate cancer, hypogonadism Small bowel enteroscopy 02/11/2020-esophagus normal.  Hematin found in the gastric body.  Examined duodenum normal.  No evidence of significant pathology in the entire examined portion of the jejunum.  There was no overt source of bleeding or inflammation.  The bleeding may be from small AVMs.  Surgical anastomosis was not visible with repeated inspection of the duodenum. Colonoscopy 02/11/2020-multiple sessile and semipedunculated polyps were found in the rectum, descending colon and ascending colon, 3 to 18 mm in size.  Polyps removed (polypectomy descending colon-fragments of tubular adenoma, inflammatory type polyp; polypectomy ascending colon-tubular adenoma, no high-grade dysplasia or malignancy; polypectomy descending colon-fragments of tubulovillous adenoma, no high-grade dysplasia or malignancy; polypectomy colon, rectum-tubular adenoma, no high-grade dysplasia or malignancy). Colonoscopy 02/02/2021-seven 2 to 10 mm polyps in the sigmoid colon (tubular adenoma), descending colon (fragments of tubulovillous adenoma, fragments of tubular adenoma  ) and transverse colon (fragments of tubular adenoma). Progressive anemia 07/05/2021; stool cards positive Upper endoscopy/colonoscopy 09/23/2021-multiple polyps removed from the colon-tubular adenomas, diffuse angioectasias in the stomach with bleeding-not amenable to endoscopic treatment 10.  Renal failure-progressive 01/17/2022, progressive following right nephrectomy October 2023 11.  [facility] at [facility] 01/13/2022-no evidence of thoracic metastatic disease, similar diffuse sclerotic changes in the skeleton 12.  CT renal stone study 01/14/2022-mild left side hydroureteronephrosis secondary to a left UVJ stone, left nephrolithiasis, new right middle lobe collapse/consolidation, edema/fluid at the left iliopsoas muscle 13.  PE noted on CT 05/05/2022-on apixaban .  He saw Dr. Loralie Rocher at [facility] 05/16/2022; instructions are to hold apixaban  72 hours prior to scheduled surgery and start Lovenox at prophylactic dosing. 14.  CTs at [facility] 02/13/2024-new periaortic lymphadenopathy and peritoneal and omental carcinomatosis, small amount of new ascites, similar-appearing diffuse bony metastatic disease, focal thickening of the gastric body        Disposition: [name] appears stable.  He was recently hospitalized with worsening renal function.  Creatinine improved with hydration.  He had CT scans during the hospitalization that showed new periaortic lymphadenopathy, peritoneal and omental carcinomatosis, small amount of new ascites, stable bone metastases.  We reviewed the results with him and his sister at today's visit.  They understand and agree with the recommendation for a biopsy.  We have requested the images be pushed through to our system.  We will ask radiology to review and obtain a biopsy.  He is on Eliquis .  He understands to hold Eliquis  for 2 days prior to the biopsy and resume after the biopsy per radiology recommendation.  We reviewed the CBC and chemistry panel from today.  Hemoglobin is stable.  Creatinine  improved.  He will return for follow-up on 03/12/2024.  We are available to see him sooner if needed.  Patient seen with Dr. Scherrie Curt.  Diana Forster ANP/GNP-BC   02/22/2024  11:59 AM This was a shared visit with Diana Forster.  Mr. Legault was interviewed and examined.  He  was admitted to [facility] 02/13/2024 with acute renal failure.  During the hospital admission CTs confirmed evidence of abdominal carcinomatosis and peritoneal lymphadenopathy.  I discussed the case with the [facility] inpatient team while he was there.  The carcinomatosis may be related to recurrent renal cell carcinoma, small bowel carcinoma, prostate cancer, or another etiology.  We will have the images sent here and ask interventional radiology to perform a diagnostic biopsy.  We discussed the differential diagnosis with Mr. Crampton.  He will return for an office visit after the diagnostic biopsy.  I was present for greater than 50% of today's visit.  I performed medical decision making.  Anise Kerns, MD

## 2024-02-23 ENCOUNTER — Other Ambulatory Visit: Payer: Self-pay

## 2024-02-23 ENCOUNTER — Inpatient Hospital Stay
Admission: RE | Admit: 2024-02-23 | Discharge: 2024-02-23 | Disposition: A | Discharge status: Home / Self Care | Payer: Self-pay | Source: Ambulatory Visit | Attending: Nurse Practitioner | Admitting: Nurse Practitioner

## 2024-02-23 DIAGNOSIS — N179 Acute kidney failure, unspecified: Secondary | ICD-10-CM

## 2024-02-23 DIAGNOSIS — C61 Malignant neoplasm of prostate: Secondary | ICD-10-CM

## 2024-02-23 LAB — PROSTATE-SPECIFIC AG, SERUM (LABCORP): Prostate Specific Ag, Serum: 5 ng/mL — ABNORMAL HIGH (ref 0.0–4.0)

## 2024-02-23 NOTE — Progress Notes (Signed)
 Specialty Pharmacy Refill Coordination Note  Brandon Peters is a 68 y.o. male contacted today regarding refills of specialty medication(s) Abiraterone  Acetate ([id number] )   Patient requested [name] at [facility] at [place] date: 03/01/24   Medication will be filled on 06.05.25.

## 2024-02-23 NOTE — Progress Notes (Signed)
 Brandon Silvas, MD  Joelle Musca PROCEDURE / BIOPSY REVIEW Date: 02/23/24  Requested Biopsy site: omental caking Reason for request: r/o mets Imaging review: Best seen on CT 02/13/24  Decision: Approved Imaging modality to perform: CT Schedule with: Moderate Sedation Schedule for: Any VIR  Additional comments:   Please contact me with questions, concerns, or if issue pertaining to this request arise.  Dayne Brandon Silvas, MD [facility]       Previous Messages    ----- Message ----- From: Izekiel Flegel Sent: 02/23/2024  11:56 AM EDT To: Rosetta Cons, MD; Joelle Musca; * Subject: CT Biopsy                                      Procedure : CT Biopsy  Reason: prostate cancer, h/o kidney cancer, h/o small bowel cancer--CT 02/13/2024 at [facility] with new periaortic lymphadenopathy and peritoneal and omental carcinomatosis.  Images are being pushed through. 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    History : CT Chest w/o , CT abdomen pelvis w/  -- ( both in PAC's )  Provider : Roseline Conine, NP  Contact : [phone]

## 2024-02-24 LAB — ERYTHROPOIETIN: Erythropoietin: 141.9 m[IU]/mL — ABNORMAL HIGH (ref 2.6–18.5)

## 2024-02-26 NOTE — Progress Notes (Signed)
 Brandon Silvas, MD  Brandon Peters PROCEDURE / BIOPSY REVIEW Date: 02/26/24  Requested Biopsy site: L paraaortic LAN   or L omental disease Reason for request: r/o met Imaging review: Best seen on CT 02/13/24 Im 53 and 56 Se2  Decision: Approved Imaging modality to perform: CT Schedule with: Moderate Sedation Schedule for: Any VIR  Additional comments:   Please contact me with questions, concerns, or if issue pertaining to this request arise.  Brandon Brandon Silvas, MD [facility]       Previous Messages    ----- Message ----- From: Brandon Peters Sent: 02/23/2024  11:56 AM EDT To: Brandon Cons, MD; Brandon Peters; * Subject: CT Biopsy                                      Procedure : CT Biopsy  Reason: prostate cancer, h/o kidney cancer, h/o small bowel cancer--CT 02/13/2024 at [facility] with new periaortic lymphadenopathy and peritoneal and omental carcinomatosis.  Images are being pushed through. 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    History : CT Chest w/o , CT abdomen pelvis w/  -- ( both in PAC's )  Provider : Roseline Conine, NP  Contact : [phone]

## 2024-02-29 ENCOUNTER — Other Ambulatory Visit: Payer: Self-pay

## 2024-03-01 ENCOUNTER — Encounter: Payer: Self-pay | Admitting: Nurse Practitioner

## 2024-03-06 ENCOUNTER — Other Ambulatory Visit: Payer: Self-pay | Admitting: Student

## 2024-03-06 DIAGNOSIS — Z01818 Encounter for other preprocedural examination: Secondary | ICD-10-CM

## 2024-03-06 DIAGNOSIS — E119 Type 2 diabetes mellitus without complications: Secondary | ICD-10-CM

## 2024-03-07 ENCOUNTER — Other Ambulatory Visit: Payer: Self-pay

## 2024-03-07 ENCOUNTER — Ambulatory Visit (HOSPITAL_COMMUNITY)
Admission: RE | Admit: 2024-03-07 | Discharge: 2024-03-07 | Disposition: A | Discharge status: Home / Self Care | Source: Ambulatory Visit | Attending: Nurse Practitioner | Admitting: Nurse Practitioner

## 2024-03-07 ENCOUNTER — Encounter (HOSPITAL_COMMUNITY): Payer: Self-pay

## 2024-03-07 DIAGNOSIS — C786 Secondary malignant neoplasm of retroperitoneum and peritoneum: Secondary | ICD-10-CM | POA: Diagnosis not present

## 2024-03-07 DIAGNOSIS — E119 Type 2 diabetes mellitus without complications: Secondary | ICD-10-CM

## 2024-03-07 DIAGNOSIS — Z8546 Personal history of malignant neoplasm of prostate: Secondary | ICD-10-CM | POA: Insufficient documentation

## 2024-03-07 DIAGNOSIS — R590 Localized enlarged lymph nodes: Secondary | ICD-10-CM | POA: Insufficient documentation

## 2024-03-07 DIAGNOSIS — C61 Malignant neoplasm of prostate: Secondary | ICD-10-CM

## 2024-03-07 DIAGNOSIS — Z85528 Personal history of other malignant neoplasm of kidney: Secondary | ICD-10-CM | POA: Insufficient documentation

## 2024-03-07 DIAGNOSIS — Z01818 Encounter for other preprocedural examination: Secondary | ICD-10-CM

## 2024-03-07 DIAGNOSIS — C179 Malignant neoplasm of small intestine, unspecified: Secondary | ICD-10-CM | POA: Insufficient documentation

## 2024-03-07 DIAGNOSIS — C641 Malignant neoplasm of right kidney, except renal pelvis: Secondary | ICD-10-CM

## 2024-03-07 LAB — GLUCOSE, CAPILLARY
Glucose-Capillary: 108 mg/dL — ABNORMAL HIGH (ref 70–99)
Glucose-Capillary: 89 mg/dL (ref 70–99)

## 2024-03-07 LAB — CBC
HCT: 28.9 % — ABNORMAL LOW (ref 39.0–52.0)
Hemoglobin: 8.4 g/dL — ABNORMAL LOW (ref 13.0–17.0)
MCH: 29.5 pg (ref 26.0–34.0)
MCHC: 29.1 g/dL — ABNORMAL LOW (ref 30.0–36.0)
MCV: 101.4 fL — ABNORMAL HIGH (ref 80.0–100.0)
Platelets: 247 10*3/uL (ref 150–400)
RBC: 2.85 MIL/uL — ABNORMAL LOW (ref 4.22–5.81)
RDW: 17.1 % — ABNORMAL HIGH (ref 11.5–15.5)
WBC: 9.2 10*3/uL (ref 4.0–10.5)
nRBC: 0 % (ref 0.0–0.2)

## 2024-03-07 LAB — PROTIME-INR
INR: 1.1 (ref 0.8–1.2)
Prothrombin Time: 14.1 s (ref 11.4–15.2)

## 2024-03-07 MED ORDER — MIDAZOLAM HCL 2 MG/2ML IJ SOLN
INTRAMUSCULAR | Status: AC
  Filled 2024-03-07: qty 2

## 2024-03-07 MED ORDER — MIDAZOLAM HCL 2 MG/2ML IJ SOLN
INTRAMUSCULAR | Status: AC | PRN
  Administered 2024-03-07: 1 mg via INTRAVENOUS

## 2024-03-07 MED ORDER — FENTANYL CITRATE (PF) 100 MCG/2ML IJ SOLN
INTRAMUSCULAR | Status: AC | PRN
  Administered 2024-03-07: 50 ug via INTRAVENOUS

## 2024-03-07 MED ORDER — LIDOCAINE HCL 1 % IJ SOLN
10.0000 mL | Freq: Once | INTRAMUSCULAR | Status: AC
  Administered 2024-03-07: 10 mL via INTRADERMAL

## 2024-03-07 MED ORDER — FENTANYL CITRATE (PF) 100 MCG/2ML IJ SOLN
INTRAMUSCULAR | Status: AC
  Filled 2024-03-07: qty 2

## 2024-03-07 NOTE — Procedures (Signed)
 Pre procedural Dx: Omental thickening  Post procedural Dx: Same  Technically successful CT guided biopsy of LUQ omentum   EBL: None.   Complications: None immediate.   Zettie Hillock, MD Pager #: [phone]

## 2024-03-07 NOTE — H&P (Signed)
 Chief Complaint: Patient was seen in consultation today for omental biopsy   Referring Physician(s): Roseline Conine  Supervising Physician: Dr. Ihor Makua  Patient Status: [facility] - Out-pt  History of Present Illness: Brandon Peters is a 68 y.o. male with hx of prostate and renal cell cancer. He was recently at [facility] and imaging there showed metastatic disease progression including periaortic and mesenteric/omental caking.  He is referred for image guided biopsy and is scheduled for procedure today.  PMHx, meds, labs, imaging, allergies reviewed. Has held his Eliquis  for the past 3 days as directed Feels well, no recent fevers, chills, illness. Has been NPO today as directed.   Past Medical History:  Diagnosis Date   ARF (acute renal failure) (HCC)    d/t lisinopril and/or diazide- was on both in a 2 week period- cr up to 4.99   Diabetes mellitus    Family history of lung cancer    Hyperlipidemia    Hypertension    Personal history of other malignant neoplasm of small intestine    RENAL CALCULUS, HX OF 06/26/2007    Past Surgical History:  Procedure Laterality Date   COLONOSCOPY N/A 03/04/2016   Procedure: COLONOSCOPY;  Surgeon: Alvis Jourdain, MD;  Location: [facility] ENDOSCOPY;  Service: Endoscopy;  Laterality: N/A;   ENTEROSCOPY N/A 03/04/2016   Procedure: ENTEROSCOPY;  Surgeon: Alvis Jourdain, MD;  Location: [facility] ENDOSCOPY;  Service: Endoscopy;  Laterality: N/A;    Allergies: Penicillins  Medications: Prior to Admission medications   Medication Sig Start Date End Date Taking? Authorizing Provider  abiraterone  acetate (ZYTIGA ) 250 MG tablet TAKE 4 TABLETS BY MOUTH ONCE DAILY AS DIRECTED.  TAKE 1 HOUR BEFORE OR 2 HOURS AFTER A MEAL 11/23/23  Yes Sumner Ends, MD  amLODipine  (NORVASC ) 10 MG tablet Take 10 mg by mouth daily. 04/21/16  Yes [provider]  docusate sodium  (COLACE) 100 MG capsule Take 100 mg by mouth daily as needed for mild constipation.   Yes [provider]  ferrous sulfate  325 (65 FE) MG EC tablet Take 1 tablet (325 mg total) by mouth 3 (three) times daily with meals. 11/06/19  Yes Sumner Ends, MD  predniSONE  (DELTASONE ) 5 MG tablet Take 1 tablet by mouth once daily with breakfast 12/05/23  Yes Sumner Ends, MD  rosuvastatin  (CRESTOR ) 10 MG tablet Take 10 mg by mouth every morning. 02/23/21  Yes [provider]  tamsulosin  (FLOMAX ) 0.4 MG CAPS capsule Take 0.4 mg by mouth at bedtime. 05/02/16  Yes [provider]  apixaban  (ELIQUIS ) 2.5 MG TABS tablet Take 1 tablet by mouth twice daily 09/28/23   Sumner Ends, MD  empagliflozin (JARDIANCE) 10 MG TABS tablet Take 1 tablet by mouth daily. Patient not taking: Reported on 02/22/2024 10/26/23   [provider]  linagliptin (TRADJENTA) 5 MG TABS tablet Take 1 tablet by mouth daily. 02/17/24 03/18/24  [provider]  loperamide  (IMODIUM ) 2 MG capsule Take 2 mg by mouth daily as needed for diarrhea or loose stools. Patient not taking: Reported on 02/06/2024    [provider]  torsemide (DEMADEX) 10 MG tablet Take 10 mg by mouth daily. Patient not taking: Reported on 02/22/2024    [provider]     Family History  Problem Relation Age of Onset   Cancer Mother    Diabetes Mother    Hypertension Mother    Coronary artery disease Mother    Hyperlipidemia Sister    Hypertension Sister  Social History   Socioeconomic History   Marital status: Single    Spouse name: Not on file   Number of children: Not on file   Years of education: Not on file   Highest education level: Not on file  Occupational History   Not on file  Tobacco Use   Smoking status: Never   Smokeless tobacco: Never  Vaping Use   Vaping status: Never Used  Substance and Sexual Activity   Alcohol use: No   Drug use: No   Sexual activity: Not on file  Other Topics Concern   Not on file  Social History Narrative   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 Never married. Took college classes at [employer] --.got a Personnel officer degree from [employer]   Social Drivers of Health   Financial Resource Strain: Low Risk  (07/21/2022)   Received from [facility]   Overall Financial Resource Strain (CARDIA)    Difficulty of Paying Living Expenses: Not hard at all  Food Insecurity: Low Risk  (03/09/2023)   Received from [facility]   Hunger Vital Sign    Worried About Running Out of Food in the Last Year: Never true    Ran Out of Food in the Last Year: Never true  Transportation Needs: Not on file (03/09/2023)  Physical Activity: Not on file  Stress: Not on file  Social Connections: Not on file    Review of Systems: A 12 point ROS discussed and pertinent positives are indicated in the HPI above.  All other systems are negative.  Review of Systems  Vital Signs: BP (!) 176/90   Pulse 100   Temp 98 F (36.7 C) (Oral)   Resp 19   Ht 5' 5 (1.651 m)   Wt 152 lb (68.9 kg)   SpO2 98%   BMI 25.29 kg/m   Physical Exam HENT:     Mouth/Throat:     Mouth: Mucous membranes are moist.     Pharynx: Oropharynx is clear.   Cardiovascular:     Rate and Rhythm: Normal rate and regular rhythm.     Heart sounds: Normal heart sounds.  Pulmonary:     Effort: Pulmonary effort is normal. No respiratory distress.     Breath sounds: Normal breath sounds.  Abdominal:     General: There is no distension.     Tenderness: There is no abdominal tenderness.   Skin:    General: Skin is warm and dry.   Neurological:     General: No focal deficit present.     Mental Status: He is alert and oriented to person, place, and time.   Psychiatric:        Mood and Affect: Mood normal.        Thought Content: Thought content normal.      Imaging: DG Chest 2 View Result Date: 02/06/2024 CLINICAL DATA:  History of renal cell carcinoma with cough EXAM: CHEST - 2 VIEW COMPARISON:  Chest radiograph dated 01/15/2022 FINDINGS:  Normal lung volumes. No focal consolidations. Diffuse bilateral patchy opacities of the lung parenchyma. No pleural effusion or pneumothorax. The heart size and mediastinal contours are within normal limits. Similar diffuse heterogeneously sclerotic appearance of the osseous structures. No acute displaced pathologic fracture. IMPRESSION: 1. Diffuse bilateral patchy opacities of the lung parenchyma may be artifactual related to superimposition of the sclerotic osseous lesions. Atypical infection can have a similar appearance. 2. Similar diffuse heterogeneously sclerotic  appearance of the osseous structures, in keeping with known metastatic disease. Electronically Signed   By: Limin  Xu M.D.   On: 02/06/2024 13:54    Labs:  CBC: Recent Labs    10/16/23 1007 12/07/23 0812 02/06/24 1106 02/22/24 1123  WBC 8.0 8.3 9.3 7.1  HGB 9.5* 9.2* 8.1* 8.2*  HCT 31.2* 30.6* 26.3* 28.1*  PLT 199 224 253 241    COAGS: No results for input(s): INR, APTT in the last 8760 hours.  BMP: Recent Labs    10/16/23 1007 12/07/23 0812 02/06/24 1106 02/22/24 1123  NA 141 142 137 139  K 3.7 3.6 3.0* 3.4*  CL 110 110 97* 106  CO2 23 22 22  18*  GLUCOSE 118* 101* 334* 172*  BUN 38* 41* 52* 45*  CALCIUM  9.0 9.1 9.6 9.6  CREATININE 2.52* 2.46* 3.55* 2.63*  GFRNONAA 27* 28* 18* 26*    LIVER FUNCTION TESTS: Recent Labs    10/16/23 1007 12/07/23 0812 02/06/24 1106 02/22/24 1123  BILITOT 0.5 0.5 0.5 0.4  AST 15 15 23  42*  ALT 13 12 17  36  ALKPHOS 55 54 83 71  PROT 6.9 6.8 6.8 7.0  ALBUMIN 4.1 4.2 3.8 3.7     Assessment and Plan: Omental mass/caking Plan for image guided biopsy. Labs reviewed. Risks and benefits of omental biopsy was discussed with the patient and/or patient's family including, but not limited to bleeding, infection, damage to adjacent structures or low yield requiring additional tests.  All of the questions were answered and there is agreement to proceed.  Consent signed  and in chart.    Electronically Signed: Prudence Brown, PA-C 03/07/2024, 8:59 AM   I spent a total of 20 minutes in face to face in clinical consultation, greater than 50% of which was counseling/coordinating care for omental biopsy

## 2024-03-10 ENCOUNTER — Other Ambulatory Visit: Payer: Self-pay | Admitting: Oncology

## 2024-03-11 ENCOUNTER — Encounter: Payer: Self-pay | Admitting: Oncology

## 2024-03-12 ENCOUNTER — Telehealth: Payer: Self-pay

## 2024-03-12 ENCOUNTER — Encounter: Payer: Self-pay | Admitting: *Deleted

## 2024-03-12 ENCOUNTER — Inpatient Hospital Stay: Admitting: Nurse Practitioner

## 2024-03-12 ENCOUNTER — Encounter: Payer: Self-pay | Admitting: Nurse Practitioner

## 2024-03-12 ENCOUNTER — Inpatient Hospital Stay: Attending: Oncology

## 2024-03-12 VITALS — BP 131/71 | HR 92 | Temp 98.1°F | Resp 18 | Ht 65.0 in | Wt 153.6 lb

## 2024-03-12 DIAGNOSIS — C61 Malignant neoplasm of prostate: Secondary | ICD-10-CM

## 2024-03-12 DIAGNOSIS — C179 Malignant neoplasm of small intestine, unspecified: Secondary | ICD-10-CM

## 2024-03-12 DIAGNOSIS — C649 Malignant neoplasm of unspecified kidney, except renal pelvis: Secondary | ICD-10-CM | POA: Insufficient documentation

## 2024-03-12 DIAGNOSIS — E119 Type 2 diabetes mellitus without complications: Secondary | ICD-10-CM | POA: Insufficient documentation

## 2024-03-12 DIAGNOSIS — C641 Malignant neoplasm of right kidney, except renal pelvis: Secondary | ICD-10-CM

## 2024-03-12 DIAGNOSIS — D649 Anemia, unspecified: Secondary | ICD-10-CM | POA: Diagnosis not present

## 2024-03-12 DIAGNOSIS — Q539 Undescended testicle, unspecified: Secondary | ICD-10-CM | POA: Insufficient documentation

## 2024-03-12 DIAGNOSIS — C7951 Secondary malignant neoplasm of bone: Secondary | ICD-10-CM | POA: Insufficient documentation

## 2024-03-12 LAB — CBC WITH DIFFERENTIAL (CANCER CENTER ONLY)
Abs Immature Granulocytes: 0.06 10*3/uL (ref 0.00–0.07)
Basophils Absolute: 0 10*3/uL (ref 0.0–0.1)
Basophils Relative: 0 %
Eosinophils Absolute: 0.1 10*3/uL (ref 0.0–0.5)
Eosinophils Relative: 2 %
HCT: 28 % — ABNORMAL LOW (ref 39.0–52.0)
Hemoglobin: 8.4 g/dL — ABNORMAL LOW (ref 13.0–17.0)
Immature Granulocytes: 1 %
Lymphocytes Relative: 7 %
Lymphs Abs: 0.5 10*3/uL — ABNORMAL LOW (ref 0.7–4.0)
MCH: 29.8 pg (ref 26.0–34.0)
MCHC: 30 g/dL (ref 30.0–36.0)
MCV: 99.3 fL (ref 80.0–100.0)
Monocytes Absolute: 0.5 10*3/uL (ref 0.1–1.0)
Monocytes Relative: 8 %
Neutro Abs: 5.6 10*3/uL (ref 1.7–7.7)
Neutrophils Relative %: 82 %
Platelet Count: 201 10*3/uL (ref 150–400)
RBC: 2.82 MIL/uL — ABNORMAL LOW (ref 4.22–5.81)
RDW: 16.7 % — ABNORMAL HIGH (ref 11.5–15.5)
WBC Count: 6.8 10*3/uL (ref 4.0–10.5)
nRBC: 0 % (ref 0.0–0.2)

## 2024-03-12 LAB — CMP (CANCER CENTER ONLY)
ALT: 15 U/L (ref 0–44)
AST: 22 U/L (ref 15–41)
Albumin: 3.9 g/dL (ref 3.5–5.0)
Alkaline Phosphatase: 65 U/L (ref 38–126)
Anion gap: 13 (ref 5–15)
BUN: 29 mg/dL — ABNORMAL HIGH (ref 8–23)
CO2: 20 mmol/L — ABNORMAL LOW (ref 22–32)
Calcium: 9.6 mg/dL (ref 8.9–10.3)
Chloride: 107 mmol/L (ref 98–111)
Creatinine: 2 mg/dL — ABNORMAL HIGH (ref 0.61–1.24)
GFR, Estimated: 36 mL/min — ABNORMAL LOW (ref >60)
Glucose, Bld: 139 mg/dL — ABNORMAL HIGH (ref 70–99)
Potassium: 3.9 mmol/L (ref 3.5–5.1)
Sodium: 140 mmol/L (ref 135–145)
Total Bilirubin: 0.4 mg/dL (ref 0.0–1.2)
Total Protein: 7 g/dL (ref 6.5–8.1)

## 2024-03-12 LAB — SAMPLE TO BLOOD BANK

## 2024-03-12 NOTE — Telephone Encounter (Signed)
 Spoke with sister/ patient to inform scheduled CT chest ABD Pelvis wo contrast on 03/21/24 at 2:30pm with Radiology. Location at [facility]. Understood, no further questions.

## 2024-03-12 NOTE — Progress Notes (Signed)
 [facility] OFFICE PROGRESS NOTE   Diagnosis: Prostate cancer, renal cell carcinoma, small bowel cancer  INTERVAL HISTORY:   Mr. Delisle returns as scheduled.  He denies pain.  No bleeding.  No nausea or vomiting.  Objective:  Vital signs in last 24 hours:  Blood pressure 131/71, pulse 92, temperature 98.1 F (36.7 C), temperature source Temporal, resp. rate 18, height 5' 5 (1.651 m), weight 153 lb 9.6 oz (69.7 kg), SpO2 100%.     Resp: Lungs clear bilaterally. Cardio: Regular rate and rhythm. GI: Abdomen appears distended.  No apparent ascites.  No hepatosplenomegaly. Vascular: Pitting edema lower leg bilaterally.   Lab Results:  Lab Results  Component Value Date   WBC 6.8 03/12/2024   HGB 8.4 (L) 03/12/2024   HCT 28.0 (L) 03/12/2024   MCV 99.3 03/12/2024   PLT 201 03/12/2024   NEUTROABS 5.6 03/12/2024    Imaging:  No results found.  Medications: I have reviewed the patient's current medications.  Assessment/Plan: History of Severe anemia-status post a red cell transfusion 03/04/2016 2.. Prostate cancer Extensive sclerotic bone metastases noted on a CT of the abdomen/pelvis 03/04/2016 and MRI abdomen 03/05/2016 Markedly elevated PSA Lupron  initiated 03/15/2016; Casodex  14 days beginning 03/15/2016 Prostate biopsy 03/18/2016 05/09/2016 PSA significantly improved at 21 Abiraterone  initiated 05/10/2016 PSA less than 0.1 07/10/2018 PSA less than 0.1 08/30/2018 PSA less than 0.1 10/18/2018 PSA less than 0.1 11/29/2018 PSA less than 0.1 on 11/06/2019  PSA less than 0.1 on 03/05/2020 PSA less than 0.1 on 07/06/2020 PSA less than 0.1 08/17/2021 PSA less than 0.1 on 09/30/2021 PSA 0.1 on 11/12/2021 PSA 0.1 on 02/18/2022 PSA 0.2 on 12/09/2022 PSA 0.3 on 04/04/2023  PSA 0.4 on 06/06/2023 PSA 0.5 on 07/05/2023 PSA 0.7 on 08/09/2023 PSA 1.5 on 12/07/2023 PSA 4.7 on 02/06/2024 PSA 6.37 on 02/14/2024 PSA 5.0 on 02/22/2024     3.   Symptoms of obstructive  uropathy. Improved   4.   Distal duodenal and descending colon masses biopsy of the duodenal mass 03/04/2016 confirmed fragments of small bowel mucosa with focal high-grade dysplasia Biopsy of the descending colon polyps revealed fragments of a tubulovillous adenoma CT 08/29/2017-similar soft tissue thickening in the region of the splenic flexure of the colon suboptimally evaluated due to lack of colonic enteric contrast Referred to Dr. Nickey Barn Referred to [facility] Resection of descending colon polyps 01/31/2018, tubulovillous adenomas with high-grade dysplasia Attempted endoscopic resection of the fourth segment duodenal polyp on 05/09/2018, firm unresectable lesion, biopsy revealed moderately differentiated adenocarcinoma CT abdomen/pelvis 05/25/2018- heterogeneously enhancing right lower pole renal lesion; diffuse sclerotic osseous metastatic lesions compatible with history of prostate cancer CT chest 05/25/2018- no pulmonary metastasis.  Diffuse osseous metastasis involving the axial and appendicular skeleton. 06/19/2018-sleeve resection of third and fourth portions of duodenum with primary end-to-end anastomosis, feeding gastrojejunostomy tube placement, Dr. Elray Hall; poorly differentiated adenocarcinoma duodenum, 3 cm; tumor invades the muscularis propria; lymphovascular invasion present; negative margins; 4 of 11 involved lymph nodes; soft tissue deposits also seen in the mesentery; all mucosal margins and soft tissue margins negative but 2 of the positive nodes are found in the soft tissue margin submitted for frozen section (pT2 pN2); second mucosal lesion 1.2 cm from the proximal margin, tubular adenoma with focal severe dysplasia Intact expression of mismatch repair proteins by IHC; microsatellite stable Cycle 1 adjuvant Xeloda  07/23/2018 Cycle 2 adjuvant Xeloda  08/13/2018 Cycle 3 adjuvant Xeloda  09/10/2018 (dose reduced to 1000 mg twice daily for 14 days)  Cycle 4 adjuvant Xeloda   10/01/2018 (dose  increased to 1500 mg every morning and 1000 mg every afternoon for 14 days) Cycle 5 adjuvant Xeloda  10/22/2018 Cycle 6 adjuvant Xeloda  11/12/2018 Cycle 7 adjuvant Xeloda  12/03/2018 Cycle 8 adjuvant Xeloda  12/24/2018   5.  Anorexia/weight loss- resolved   6.  Undescended testicles   7.  Right renal mass.  CT 08/29/2017-right renal mass involving the lower pole was partially calcified and does demonstrate enhancement following contrast most consistent with renal cell carcinoma.  Lesion not significantly enlarged compared with the prior CT. Followed by urology. 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 to the IVC/RA junction, upper normal size retrocaval node 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, nuclear grade 3 Axitinib /pembrolizumab  10/22/2021 Axitinib  placed on hold 11/05/2021 due to hand-foot syndrome Axitinib  resumed at a dose of 5 mg daily 11/12/2021, pembrolizumab  11/12/2021 Axitinib  discontinued 12/03/2021 secondary to progressive hand-foot syndrome, pembrolizumab  12/03/2021 Axitinib  resumed at a reduced dose of 2 mg twice daily after office visit 12/14/2021 Pembrolizumab  12/24/2021 Axitinib  stopped 01/03/2022 secondary to persistent hand/foot symptoms CT chest at [facility] 01/13/2022-no evidence of thoracic metastatic disease, similar diffuse sclerotic change in the skeleton MRI abdomen at [facility] 01/13/2022-similar size of right lower pole renal mass with renal vein  thrombosis, decreased overall extent of thrombus now extending to the intrahepatic IVC, enhancement of right renal mass and thrombus is decreased, new left hydroureter ureter nephrosis with asymmetric perinephric stranding Pembrolizumab  01/27/2022 Axitinib  resumed 2 mg daily 01/28/2022 CT abdomen/pelvis at [facility] 02/10/2022-overall reduced size of right lower pole renal mass which demonstrates persistent region of enhancement along the posterior and lateral margin with extension into the renal sinus.  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  Query additional IVC thrombus at the intrahepatic IVC and extending into the right atrium. Axitinib  continued 2 mg daily, Pembrolizumab  every 3 weeks CT 05/05/2022 at [facility]-partially visualized pulmonary embolism involving segmental arteries of the right lower lobe.  Interval slight decrease in size of the mass arising from the lower pole the right kidney.  Mass demonstrates persistent moderate necrosis centrally.  Increased enhancing soft tissue component measuring up to 2 cm which is mildly increased in size compared to prior from 02/10/2022.  Bland tumor thrombus identified in the IVC at the level of the right renal vein ostium extending into the suprarenal IVC superiorly.  No obvious thrombus identified in the infrarenal, intrahepatic and suprahepatic IVC.  Likely trace bland thrombus in the anterior right renal vein.  Stable several subcentimeter retroperitoneal lymph nodes.  Diffuse osseous sclerotic and lytic metastatic disease stable compared to prior. Axitinib  placed on hold 05/04/2022 due to pain bilateral feet; 05/13/2022 he will continue to hold axitinib  Pembrolizumab  05/13/2022 Pembrolizumab  06/03/2022 Robotic right radical nephrectomy and IVC thrombectomy 06/27/2022-right kidney with tumor necrosis/hemorrhage,ypT0, adherent thrombus with cellular necrosis and no viable carcinoma present at the renal vein margin, other margins negative for  carcinoma, 0/3 lymph nodes.  6.5 cm tumor including intraparenchymal lesion and thrombus in the renal vein 8.  Diabetes   9.  Anemia-stool Hemoccults + March 2021,  referred to Dr. Nickey Barn for endoscopic evaluation, anemia also secondary to metastatic prostate cancer, hypogonadism Small bowel enteroscopy 02/11/2020-esophagus normal.  Hematin found in the gastric body.  Examined duodenum normal.  No evidence of significant pathology in the entire examined portion of the jejunum.  There was no overt source of bleeding or inflammation.  The bleeding may be from small AVMs.  Surgical anastomosis was not visible with repeated inspection of the duodenum. Colonoscopy 02/11/2020-multiple sessile and semipedunculated polyps were found in the rectum, descending colon and ascending colon, 3 to 18 mm in size.  Polyps removed (polypectomy descending colon-fragments of tubular adenoma, inflammatory type polyp; polypectomy ascending colon-tubular adenoma, no high-grade dysplasia or malignancy; polypectomy descending colon-fragments of tubulovillous adenoma, no high-grade dysplasia or malignancy; polypectomy colon, rectum-tubular adenoma, no high-grade dysplasia or malignancy). Colonoscopy 02/02/2021-seven 2 to 10 mm polyps in the sigmoid colon (tubular adenoma), descending colon (fragments of tubulovillous adenoma, fragments of tubular adenoma ) and transverse colon (fragments of tubular adenoma). Progressive anemia 07/05/2021; stool cards positive Upper endoscopy/colonoscopy 09/23/2021-multiple polyps removed from the colon-tubular adenomas, diffuse angioectasias in the stomach with bleeding-not amenable to endoscopic treatment 10.  Renal failure-progressive 01/17/2022, progressive following right nephrectomy October 2023 11.  CT chest at [facility] 01/13/2022-no evidence of thoracic metastatic disease, similar diffuse sclerotic changes in the skeleton 12.  CT renal stone study 01/14/2022-mild left side hydroureteronephrosis secondary  to a left UVJ stone, left nephrolithiasis, new right middle lobe collapse/consolidation, edema/fluid at the left iliopsoas muscle 13.  PE noted on CT 05/05/2022-on apixaban .  He saw Dr. Loralie Rocher at [facility] 05/16/2022; instructions are to hold apixaban  72 hours prior to scheduled surgery and start Lovenox at prophylactic dosing. 14.  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 Genther appears stable.  We reviewed the omental biopsy result with him and his sister at today's visit.  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  This could be related to the previous small bowel cancer versus another GI primary.  They understand no therapy will be curative.  At this time he appears to be asymptomatic.  Dr. Scherrie Curt reviewed supportive/comfort care versus a trial of chemotherapy.  We reviewed the goals of treatment would be to improve the cancer and potentially extend his lifespan.  We provided him with information on the FOLFOX regimen.  We have requested foundation 1 testing.  He would like to consider our discussion today and return for follow-up in the next 2 to 3 weeks to make a decision.  In the interim we are referring him for restaging CT scans.  He will return for follow-up on 03/28/2024.  We are available to see him sooner if needed.  Patient seen with Dr. Scherrie Curt.  Diana Forster ANP/GNP-BC   03/12/2024  11:42 AM   This was a shared visit with Diana Forster.  We discussed the omental biopsy findings with Mr. Overbeck.  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  He most likely has recurrence of small bowel  carcinoma, though he could have another gastrointestinal primary.  He has a history of colon polyps.  We discussed treatment options including him for care and systemic chemotherapy.  Chemotherapy options are limited by renal failure. He will undergo restaging CTs.  We will submit  the biopsy tissue for NGS testing.  Mr. Vaeth will return for an office visit and further discussion in approximately 2 weeks.  We will consider FOLFOX chemotherapy.  I was present for greater than 50% of today's visit.  I performed medical decision making.  Anise Kerns, MD

## 2024-03-12 NOTE — Progress Notes (Signed)
 Foundation one testing requested on accession number [id number]

## 2024-03-18 ENCOUNTER — Other Ambulatory Visit: Payer: Self-pay

## 2024-03-18 NOTE — Progress Notes (Signed)
 Specialty Pharmacy Ongoing Clinical Assessment Note  Brandon Peters is a 68 y.o. male who is being followed by the specialty pharmacy service for RxSp Oncology   Patient's specialty medication(s) reviewed today: Abiraterone  Acetate (ZYTIGA )   Missed doses in the last 4 weeks: 0   Patient/Caregiver did not have any additional questions or concerns.   Therapeutic benefit summary: Patient is NOT achieving benefit   Adverse events/side effects summary: No adverse events/side effects   Patient's therapy is appropriate to: Continue    Goals Addressed             This Visit's Progress    Slow Disease Progression   Worsening    Patient is not on track and worsening. Patient will maintain adherence and be monitored by provider to determine if a change in treatment plan is warranted. Patient's PSA has been increasing since December and now at 5.0. 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 Patient to return to clinic in 2-3 weeks to make a decision on his next treatment plan.         Follow up: 6 months  [facility]

## 2024-03-20 ENCOUNTER — Encounter (HOSPITAL_COMMUNITY): Payer: Self-pay | Admitting: Oncology

## 2024-03-21 ENCOUNTER — Ambulatory Visit (HOSPITAL_BASED_OUTPATIENT_CLINIC_OR_DEPARTMENT_OTHER)
Admission: RE | Admit: 2024-03-21 | Discharge: 2024-03-21 | Disposition: A | Discharge status: Home / Self Care | Source: Ambulatory Visit | Attending: Nurse Practitioner | Admitting: Nurse Practitioner

## 2024-03-21 ENCOUNTER — Encounter (HOSPITAL_COMMUNITY): Payer: Self-pay | Admitting: Oncology

## 2024-03-21 DIAGNOSIS — C61 Malignant neoplasm of prostate: Secondary | ICD-10-CM | POA: Diagnosis present

## 2024-03-21 DIAGNOSIS — D649 Anemia, unspecified: Secondary | ICD-10-CM | POA: Insufficient documentation

## 2024-03-21 DIAGNOSIS — C179 Malignant neoplasm of small intestine, unspecified: Secondary | ICD-10-CM | POA: Insufficient documentation

## 2024-03-21 DIAGNOSIS — C641 Malignant neoplasm of right kidney, except renal pelvis: Secondary | ICD-10-CM | POA: Diagnosis present

## 2024-03-25 ENCOUNTER — Other Ambulatory Visit (HOSPITAL_COMMUNITY): Payer: Self-pay

## 2024-03-25 ENCOUNTER — Other Ambulatory Visit: Payer: Self-pay

## 2024-03-25 NOTE — Progress Notes (Signed)
 Specialty Pharmacy Refill Coordination Note  Brandon Peters is a 68 y.o. male contacted today regarding refills of specialty medication(s) Abiraterone  Acetate (ZYTIGA )   Patient requested [name] at [facility] at [place] date: 04/01/24   Medication will be filled on 03/28/24.

## 2024-03-27 ENCOUNTER — Other Ambulatory Visit: Payer: Self-pay

## 2024-03-28 ENCOUNTER — Ambulatory Visit: Admitting: Nurse Practitioner

## 2024-03-28 ENCOUNTER — Other Ambulatory Visit

## 2024-03-28 ENCOUNTER — Inpatient Hospital Stay: Admitting: Nurse Practitioner

## 2024-03-28 ENCOUNTER — Other Ambulatory Visit: Payer: Self-pay | Admitting: *Deleted

## 2024-03-28 ENCOUNTER — Encounter: Payer: Self-pay | Admitting: Nurse Practitioner

## 2024-03-28 ENCOUNTER — Inpatient Hospital Stay: Attending: Oncology

## 2024-03-28 VITALS — BP 144/76 | HR 100 | Temp 97.8°F | Resp 18 | Ht 65.0 in | Wt 153.9 lb

## 2024-03-28 DIAGNOSIS — C7951 Secondary malignant neoplasm of bone: Secondary | ICD-10-CM | POA: Diagnosis not present

## 2024-03-28 DIAGNOSIS — C772 Secondary and unspecified malignant neoplasm of intra-abdominal lymph nodes: Secondary | ICD-10-CM | POA: Insufficient documentation

## 2024-03-28 DIAGNOSIS — Z85068 Personal history of other malignant neoplasm of small intestine: Secondary | ICD-10-CM | POA: Insufficient documentation

## 2024-03-28 DIAGNOSIS — C786 Secondary malignant neoplasm of retroperitoneum and peritoneum: Secondary | ICD-10-CM | POA: Insufficient documentation

## 2024-03-28 DIAGNOSIS — N19 Unspecified kidney failure: Secondary | ICD-10-CM | POA: Insufficient documentation

## 2024-03-28 DIAGNOSIS — C179 Malignant neoplasm of small intestine, unspecified: Secondary | ICD-10-CM

## 2024-03-28 DIAGNOSIS — C61 Malignant neoplasm of prostate: Secondary | ICD-10-CM | POA: Insufficient documentation

## 2024-03-28 DIAGNOSIS — Z905 Acquired absence of kidney: Secondary | ICD-10-CM | POA: Diagnosis not present

## 2024-03-28 DIAGNOSIS — Z86711 Personal history of pulmonary embolism: Secondary | ICD-10-CM | POA: Diagnosis not present

## 2024-03-28 DIAGNOSIS — Z8553 Personal history of malignant neoplasm of renal pelvis: Secondary | ICD-10-CM | POA: Insufficient documentation

## 2024-03-28 DIAGNOSIS — E119 Type 2 diabetes mellitus without complications: Secondary | ICD-10-CM | POA: Insufficient documentation

## 2024-03-28 DIAGNOSIS — C641 Malignant neoplasm of right kidney, except renal pelvis: Secondary | ICD-10-CM

## 2024-03-28 DIAGNOSIS — Z5111 Encounter for antineoplastic chemotherapy: Secondary | ICD-10-CM | POA: Diagnosis present

## 2024-03-28 DIAGNOSIS — R972 Elevated prostate specific antigen [PSA]: Secondary | ICD-10-CM | POA: Diagnosis not present

## 2024-03-28 DIAGNOSIS — Q539 Undescended testicle, unspecified: Secondary | ICD-10-CM | POA: Insufficient documentation

## 2024-03-28 DIAGNOSIS — D649 Anemia, unspecified: Secondary | ICD-10-CM | POA: Insufficient documentation

## 2024-03-28 LAB — CBC WITH DIFFERENTIAL (CANCER CENTER ONLY)
Abs Immature Granulocytes: 0.08 10*3/uL — ABNORMAL HIGH (ref 0.00–0.07)
Basophils Absolute: 0 10*3/uL (ref 0.0–0.1)
Basophils Relative: 0 %
Eosinophils Absolute: 0 10*3/uL (ref 0.0–0.5)
Eosinophils Relative: 0 %
HCT: 29.2 % — ABNORMAL LOW (ref 39.0–52.0)
Hemoglobin: 8.6 g/dL — ABNORMAL LOW (ref 13.0–17.0)
Immature Granulocytes: 1 %
Lymphocytes Relative: 4 %
Lymphs Abs: 0.4 10*3/uL — ABNORMAL LOW (ref 0.7–4.0)
MCH: 29.4 pg (ref 26.0–34.0)
MCHC: 29.5 g/dL — ABNORMAL LOW (ref 30.0–36.0)
MCV: 99.7 fL (ref 80.0–100.0)
Monocytes Absolute: 0.4 10*3/uL (ref 0.1–1.0)
Monocytes Relative: 4 %
Neutro Abs: 9.4 10*3/uL — ABNORMAL HIGH (ref 1.7–7.7)
Neutrophils Relative %: 91 %
Platelet Count: 247 10*3/uL (ref 150–400)
RBC: 2.93 MIL/uL — ABNORMAL LOW (ref 4.22–5.81)
RDW: 15.9 % — ABNORMAL HIGH (ref 11.5–15.5)
WBC Count: 10.3 10*3/uL (ref 4.0–10.5)
nRBC: 0 % (ref 0.0–0.2)

## 2024-03-28 LAB — CMP (CANCER CENTER ONLY)
ALT: 13 U/L (ref 0–44)
AST: 19 U/L (ref 15–41)
Albumin: 4.1 g/dL (ref 3.5–5.0)
Alkaline Phosphatase: 72 U/L (ref 38–126)
Anion gap: 13 (ref 5–15)
BUN: 39 mg/dL — ABNORMAL HIGH (ref 8–23)
CO2: 18 mmol/L — ABNORMAL LOW (ref 22–32)
Calcium: 9.9 mg/dL (ref 8.9–10.3)
Chloride: 108 mmol/L (ref 98–111)
Creatinine: 2.12 mg/dL — ABNORMAL HIGH (ref 0.61–1.24)
GFR, Estimated: 34 mL/min — ABNORMAL LOW (ref >60)
Glucose, Bld: 221 mg/dL — ABNORMAL HIGH (ref 70–99)
Potassium: 4.2 mmol/L (ref 3.5–5.1)
Sodium: 138 mmol/L (ref 135–145)
Total Bilirubin: 0.3 mg/dL (ref 0.0–1.2)
Total Protein: 7.4 g/dL (ref 6.5–8.1)

## 2024-03-28 LAB — CEA (ACCESS): CEA (CHCC): 4.18 ng/mL (ref 0.00–5.00)

## 2024-03-28 NOTE — Progress Notes (Signed)
START OFF PATHWAY REGIMEN - Other   [id number] (Leucovorin IV D1 + Fluorouracil IV D1/CIV D1,2 + Oxaliplatin IV D1) q14 Days:   A cycle is every 14 days:     Oxaliplatin      Leucovorin      Fluorouracil      Fluorouracil   **Always confirm dose/schedule in your pharmacy ordering system**  Patient Characteristics: Intent of Therapy: Non-Curative / Palliative Intent, Discussed with Patient

## 2024-03-28 NOTE — Progress Notes (Signed)
 [facility] OFFICE PROGRESS NOTE   Diagnosis: Prostate cancer, renal cell carcinoma, small bowel cancer  INTERVAL HISTORY:   Brandon Peters returns as scheduled.  No nausea or vomiting.  No diarrhea.  He reports a periodic cough over the past month.  No shortness of breath.  No fever.  Objective:  Vital signs in last 24 hours:  Blood pressure (!) 144/76, pulse 100, temperature 97.8 F (36.6 C), resp. rate 18, height 5' 5 (1.651 m), weight 153 lb 14.4 oz (69.8 kg), SpO2 98%.    Resp: Decreased breath sounds at the bases.  Lungs are clear.  No respiratory distress. Cardio: Regular rate and rhythm. GI: Abdomen mildly distended.  No hepatosplenomegaly. Vascular: Pitting edema lower leg bilaterally. Neuro: Alert and oriented.    Lab Results:  Lab Results  Component Value Date   WBC 10.3 03/28/2024   HGB 8.6 (L) 03/28/2024   HCT 29.2 (L) 03/28/2024   MCV 99.7 03/28/2024   PLT 247 03/28/2024   NEUTROABS 9.4 (H) 03/28/2024    Imaging:  No results found.  Medications: I have reviewed the patient's current medications.  Assessment/Plan: History of Severe anemia-status post a red cell transfusion 03/04/2016 2.. Prostate cancer Extensive sclerotic bone metastases noted on a CT of the abdomen/pelvis 03/04/2016 and MRI abdomen 03/05/2016 Markedly elevated PSA Lupron  initiated 03/15/2016; Casodex  14 days beginning 03/15/2016 Prostate biopsy 03/18/2016 05/09/2016 PSA significantly improved at 21 Abiraterone  initiated 05/10/2016 PSA less than 0.1 07/10/2018 PSA less than 0.1 08/30/2018 PSA less than 0.1 10/18/2018 PSA less than 0.1 11/29/2018 PSA less than 0.1 on 11/06/2019  PSA less than 0.1 on 03/05/2020 PSA less than 0.1 on 07/06/2020 PSA less than 0.1 08/17/2021 PSA less than 0.1 on 09/30/2021 PSA 0.1 on 11/12/2021 PSA 0.1 on 02/18/2022 PSA 0.2 on 12/09/2022 PSA 0.3 on 04/04/2023  PSA 0.4 on 06/06/2023 PSA 0.5 on 07/05/2023 PSA 0.7 on 08/09/2023 PSA 1.5 on  12/07/2023 PSA 4.7 on 02/06/2024 PSA 6.37 on 02/14/2024 PSA 5.0 on 02/22/2024     3.   Symptoms of obstructive uropathy. Improved   4.   Distal duodenal and descending colon masses biopsy of the duodenal mass 03/04/2016 confirmed fragments of small bowel mucosa with focal high-grade dysplasia Biopsy of the descending colon polyps revealed fragments of a tubulovillous adenoma CT 08/29/2017-similar soft tissue thickening in the region of the splenic flexure of the colon suboptimally evaluated due to lack of colonic enteric contrast Referred to Dr. Rollin Referred to [facility] Resection of descending colon polyps 01/31/2018, tubulovillous adenomas with high-grade dysplasia Attempted endoscopic resection of the fourth segment duodenal polyp on 05/09/2018, firm unresectable lesion, biopsy revealed moderately differentiated adenocarcinoma CT abdomen/pelvis 05/25/2018- heterogeneously enhancing right lower pole renal lesion; diffuse sclerotic osseous metastatic lesions compatible with history of prostate cancer CT chest 05/25/2018- no pulmonary metastasis.  Diffuse osseous metastasis involving the axial and appendicular skeleton. 06/19/2018-sleeve resection of third and fourth portions of duodenum with primary end-to-end anastomosis, feeding gastrojejunostomy tube placement, Dr. Ames; poorly differentiated adenocarcinoma duodenum, 3 cm; tumor invades the muscularis propria; lymphovascular invasion present; negative margins; 4 of 11 involved lymph nodes; soft tissue deposits also seen in the mesentery; all mucosal margins and soft tissue margins negative but 2 of the positive nodes are found in the soft tissue margin submitted for frozen section (pT2 pN2); second mucosal lesion 1.2 cm from the proximal margin, tubular adenoma with focal severe dysplasia Intact expression of mismatch repair proteins by IHC; microsatellite stable Cycle 1 adjuvant Xeloda  07/23/2018 Cycle  2 adjuvant Xeloda  08/13/2018 Cycle 3 adjuvant  Xeloda  09/10/2018 (dose reduced to 1000 mg twice daily for 14 days)  Cycle 4 adjuvant Xeloda  10/01/2018 (dose increased to 1500 mg every morning and 1000 mg every afternoon for 14 days) Cycle 5 adjuvant Xeloda  10/22/2018 Cycle 6 adjuvant Xeloda  11/12/2018 Cycle 7 adjuvant Xeloda  12/03/2018 Cycle 8 adjuvant Xeloda  12/24/2018   5.  Anorexia/weight loss- resolved   6.  Undescended testicles   7.  Right renal mass.  CT 08/29/2017-right renal mass involving the lower pole was partially calcified and does demonstrate enhancement following contrast most consistent with renal cell carcinoma.  Lesion not significantly enlarged compared with the prior CT. Followed by urology. 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 to the IVC/RA junction, upper normal size retrocaval node 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, nuclear grade 3 Axitinib /pembrolizumab  10/22/2021 Axitinib  placed on hold 11/05/2021 due to hand-foot syndrome Axitinib  resumed at a dose of 5 mg daily 11/12/2021, pembrolizumab  11/12/2021 Axitinib  discontinued 12/03/2021 secondary to progressive hand-foot syndrome, pembrolizumab  12/03/2021 Axitinib  resumed at a reduced dose of 2 mg twice daily after office visit 12/14/2021 Pembrolizumab  12/24/2021 Axitinib  stopped 01/03/2022 secondary to persistent hand/foot symptoms CT chest at [facility] 01/13/2022-no evidence of thoracic metastatic disease, similar diffuse sclerotic change in  the skeleton MRI abdomen at [facility] 01/13/2022-similar size of right lower pole renal mass with renal vein thrombosis, decreased overall extent of thrombus now extending to the intrahepatic IVC, enhancement of right renal mass and thrombus is decreased, new left hydroureter ureter nephrosis with asymmetric perinephric stranding Pembrolizumab  01/27/2022 Axitinib  resumed 2 mg daily 01/28/2022 CT abdomen[facility]pelvis at [facility] 02/10/2022-overall reduced size of right lower pole renal mass which demonstrates persistent region of enhancement along the posterior and lateral margin with extension into the renal sinus.  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  Query additional IVC thrombus at the intrahepatic IVC and extending into the right atrium. Axitinib  continued 2 mg daily, Pembrolizumab  every 3 weeks CT 05/05/2022 at [facility]-partially visualized pulmonary embolism involving segmental arteries of the right lower lobe.  Interval slight decrease in size of the mass arising from the lower pole the right kidney.  Mass demonstrates persistent moderate necrosis centrally.  Increased enhancing soft tissue component measuring up to 2 cm which is mildly increased in size compared to prior from 02/10/2022.  Bland tumor thrombus identified in the IVC at the level of the right renal vein ostium extending into the suprarenal IVC superiorly.  No obvious thrombus identified in the infrarenal, intrahepatic and suprahepatic IVC.  Likely trace bland thrombus in the anterior right renal vein.  Stable several subcentimeter retroperitoneal lymph nodes.  Diffuse osseous sclerotic and lytic metastatic disease stable compared to prior. Axitinib  placed on hold 05/04/2022 due to pain bilateral feet; 05/13/2022 he will continue to hold axitinib  Pembrolizumab  05/13/2022 Pembrolizumab  06/03/2022 Robotic right radical nephrectomy and IVC thrombectomy 06/27/2022-right kidney with tumor necrosis/hemorrhage,ypT0, adherent thrombus with  cellular necrosis and no viable carcinoma present at the renal vein margin, other margins negative for carcinoma, 0/3 lymph nodes.  6.5 cm tumor including intraparenchymal lesion and thrombus in the renal vein 8.  Diabetes   9.  Anemia-stool Hemoccults + March 2021, referred to Dr. Rollin for endoscopic evaluation, anemia also secondary to metastatic prostate cancer, hypogonadism Small bowel enteroscopy 02/11/2020-esophagus normal.  Hematin found in the gastric body.  Examined duodenum normal.  No evidence of significant pathology in the entire examined portion of the jejunum.  There was no overt source of bleeding or inflammation.  The bleeding may be from small AVMs.  Surgical anastomosis was not visible with repeated inspection of the duodenum. Colonoscopy 02/11/2020-multiple sessile and semipedunculated polyps were found in the rectum, descending colon and ascending colon, 3 to 18 mm in size.  Polyps removed (polypectomy descending colon-fragments of tubular adenoma, inflammatory type polyp; polypectomy ascending colon-tubular adenoma, no high-grade dysplasia or malignancy; polypectomy descending colon-fragments of tubulovillous adenoma, no high-grade dysplasia or malignancy; polypectomy colon, rectum-tubular adenoma, no high-grade dysplasia or malignancy). Colonoscopy 02/02/2021-seven 2 to 10 mm polyps in the sigmoid colon (tubular adenoma), descending colon (fragments of tubulovillous adenoma, fragments of tubular adenoma ) and transverse colon (fragments of tubular adenoma). Progressive anemia 07/05/2021; stool cards positive Upper endoscopy/colonoscopy 09/23/2021-multiple polyps removed from the colon-tubular adenomas, diffuse angioectasias in the stomach with bleeding-not amenable to endoscopic treatment 10.  Renal failure-progressive 01/17/2022, progressive following right nephrectomy October 2023 11.  CT chest at [facility] 01/13/2022-no evidence of thoracic metastatic disease, similar diffuse sclerotic  changes in the skeleton 12.  CT renal stone study 01/14/2022-mild left side hydroureteronephrosis secondary to a left UVJ stone, left nephrolithiasis, new right middle lobe collapse/consolidation, edema/fluid at the left iliopsoas muscle 13.  PE noted on CT 05/05/2022-on apixaban .  He saw Dr. Darcy at [facility] 05/16/2022; instructions are to hold apixaban  72 hours prior to scheduled surgery and start Lovenox at prophylactic dosing. 14.  CTs at [facility] 02/13/2024-new periaortic lymphadenopathy and peritoneal and omental carcinomatosis, small amount of new ascites, similar-appearing diffuse bony metastatic disease, focal thickening of the gastric body Omental biopsy 03/07/2024-metastatic moderately differentiated adenocarcinoma compatible with GI/intestinal primary; foundation 1 microsatellite stable, tumor mutation burden 1, ERBB2 amplification equivocal, NRAS Q61H CT 03/21/2024-similar abdominal retroperitoneal nodal metastasis.  1 node measuring minimally larger and the other minimally smaller.  Similar omental/peritoneal metastasis with mild increase in small volume abdominopelvic ascites.  Similar diffuse osseous metastasis.  New tiny left pleural effusion.    Disposition: Brandon Peters appears stable.  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  We again reviewed options to include supportive care versus a trial of chemotherapy.  He and his sisters understand the goal of treatment is to improve the cancer/any symptoms he is experiencing and potentially extend his life.  They understand no therapy will be curative.  He would like to proceed with chemotherapy.  We reviewed the FOLFOX regimen.  We discussed potential side effects associated with chemotherapy including bone marrow toxicity, nausea, hair loss, allergic reaction.  We reviewed the various forms of neuropathy associated with oxaliplatin.  We reviewed potential side effects associated with 5-fluorouracil including mouth sores, diarrhea,  hand-foot syndrome, skin rash, skin hyperpigmentation, increased sensitivity to sun, cardiac toxicity.  He agrees to proceed.  He will attend a chemotherapy education class.  He understands a Port-A-Cath is required for this regimen.  He is scheduled for port placement 04/08/2024.  He will return for follow-up and cycle 1 FOLFOX on 04/09/2024.  We are available to see him sooner if needed.  Patient seen with Dr. Cloretta.  Olam Ned  ANP/GNP-BC   03/28/2024  2:13 PM  This was a shared visit with Olam Ned.  Brandon Peters was interviewed and examined.  He has been diagnosed with metastatic adenocarcinoma, likely a recurrence of small bowel carcinoma though he could have carcinomatosis from another gastrointestinal primary.  We reviewed the staging CT findings and images with him.  We discussed treatment options.  He understands no therapy will be curative.  The goals of treatment are to palliate symptoms and potentially prolong survival.  The nonproductive cough may be related to diaphragmatic irritation from carcinomatosis.  I recommend FOLFOX chemotherapy.  We reviewed potential toxicities associated with the FOLFOX regimen including the chance of hematologic toxicity, infection, bleeding, cardiac toxicity, diarrhea, and allergic reaction, neuropathy, and hand/foot syndrome.  He agrees to proceed.  He will attend a chemotherapy teaching class.  Brandon Peters will be referred for Port-A-Cath placement.  The plan is to begin FOLFOX on 04/09/2024.  A treatment plan was entered today.  I was present for greater than 50% of today's visit.  I performed Medical Decision Making.  Arvella Hof, MD

## 2024-03-29 ENCOUNTER — Other Ambulatory Visit: Payer: Self-pay

## 2024-03-29 LAB — PROSTATE-SPECIFIC AG, SERUM (LABCORP): Prostate Specific Ag, Serum: 7.3 ng/mL — ABNORMAL HIGH (ref 0.0–4.0)

## 2024-03-29 NOTE — Progress Notes (Signed)
 Pharmacist Chemotherapy Monitoring - Initial Assessment    Anticipated start date: 04/09/24   The following has been reviewed per standard work regarding the patient's treatment regimen: The patient's diagnosis, treatment plan and drug doses, and organ/hematologic function Lab orders and baseline tests specific to treatment regimen  The treatment plan start date, drug sequencing, and pre-medications Prior authorization status  Patient's documented medication list, including drug-drug interaction screen and prescriptions for anti-emetics and supportive care specific to the treatment regimen The drug concentrations, fluid compatibility, administration routes, and timing of the medications to be used The patient's access for treatment and lifetime cumulative dose history, if applicable  The patient's medication allergies and previous infusion related reactions, if applicable   Changes made to treatment plan:  N/A  Follow up needed:  Pending authorization for treatment    Riyanna Crutchley Kreul, [facility], 03/29/2024  12:22 PM

## 2024-04-01 ENCOUNTER — Inpatient Hospital Stay

## 2024-04-01 LAB — SURGICAL PATHOLOGY

## 2024-04-01 MED ORDER — ONDANSETRON HCL 8 MG PO TABS: 8.0000 mg | ORAL_TABLET | Freq: Three times a day (TID) | ORAL | 1 refills | Status: DC | PRN

## 2024-04-01 MED ORDER — PROCHLORPERAZINE MALEATE 10 MG PO TABS
10.0000 mg | ORAL_TABLET | Freq: Four times a day (QID) | ORAL | 1 refills | Status: DC | PRN
Start: 2024-04-01 — End: 2024-04-27

## 2024-04-01 MED ORDER — LIDOCAINE-PRILOCAINE 2.5-2.5 % EX CREA: 1.0000 | TOPICAL_CREAM | CUTANEOUS | 0 refills | Status: DC | PRN

## 2024-04-03 ENCOUNTER — Other Ambulatory Visit: Payer: Self-pay | Admitting: Oncology

## 2024-04-04 ENCOUNTER — Encounter: Payer: Self-pay | Admitting: Oncology

## 2024-04-05 ENCOUNTER — Other Ambulatory Visit: Payer: Self-pay | Admitting: Radiology

## 2024-04-05 NOTE — H&P (Signed)
 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  He is scheduled today for Port-A-Cath placement to assist with palliative chemotherapy.  *** Patient is Full Code  Past Medical History:  Diagnosis Date   ARF (acute renal failure) (HCC)    d/t lisinopril and/or diazide- was on both in a 2 week period- cr up to 4.99   Diabetes mellitus    Family history of lung cancer    Hyperlipidemia    Hypertension    Personal history of other malignant neoplasm of small intestine    RENAL CALCULUS, HX OF 06/26/2007    Past Surgical History:  Procedure Laterality Date   COLONOSCOPY N/A 03/04/2016   Procedure: COLONOSCOPY;  Surgeon: Belvie Just, MD;  Location: [facility] ENDOSCOPY;  Service: Endoscopy;  Laterality: N/A;   ENTEROSCOPY N/A 03/04/2016   Procedure: ENTEROSCOPY;  Surgeon: Belvie Just, MD;  Location: [facility] ENDOSCOPY;  Service: Endoscopy;  Laterality: N/A;    Allergies: Penicillins  Medications: Prior to Admission medications   Medication Sig Start Date End Date Taking? Authorizing Provider  abiraterone  acetate (ZYTIGA ) 250 MG tablet TAKE 4 TABLETS BY MOUTH ONCE DAILY AS DIRECTED.  TAKE 1 HOUR BEFORE OR 2 HOURS AFTER A MEAL 11/23/23   Cloretta Arley NOVAK, MD  amLODipine  (NORVASC ) 10 MG tablet Take 10 mg by mouth daily. 04/21/16   [provider]  apixaban  (ELIQUIS ) 2.5 MG TABS tablet Take 1 tablet by mouth twice daily 04/04/24   Cloretta Arley NOVAK, MD  docusate sodium  (COLACE) 100 MG capsule Take  100 mg by mouth daily as needed for mild constipation.    [provider]  empagliflozin (JARDIANCE) 10 MG TABS tablet Take 1 tablet by mouth daily. Patient not taking: Reported on 03/12/2024 10/26/23   [provider]  ferrous sulfate  325 (65 FE) MG EC tablet Take 1 tablet (325 mg total) by mouth 3 (three) times daily with meals. 11/06/19   Cloretta Arley NOVAK, MD  lidocaine -prilocaine  (EMLA ) cream Apply 1 Application topically as needed (Apply to port 1-2 hours before its use starting 2 weeks after it is placed). 04/01/24   Cloretta Arley NOVAK, MD  linagliptin (TRADJENTA) 5 MG TABS tablet Take 1 tablet by mouth daily. 02/17/24 03/28/24  [provider]  loperamide  (IMODIUM ) 2 MG capsule Take 2 mg by mouth daily as needed for diarrhea or loose stools.    [provider]  ondansetron  (ZOFRAN ) 8 MG tablet Take 1 tablet (8 mg total) by mouth every 8 (eight) hours as needed (may use 3 days after chemo as needed for nausea). 04/01/24   Cloretta Arley NOVAK, MD  predniSONE  (DELTASONE ) 5 MG tablet Take 1 tablet by mouth once daily with breakfast 03/11/24   Cloretta Arley NOVAK, MD  prochlorperazine  (COMPAZINE ) 10 MG tablet Take 1 tablet (10 mg total) by mouth every 6 (six) hours as needed for nausea or vomiting. 04/01/24   Cloretta Arley NOVAK, MD  rosuvastatin  (CRESTOR ) 10 MG tablet Take 10 mg by mouth every morning. 02/23/21   [provider]  tamsulosin  (FLOMAX ) 0.4 MG CAPS capsule Take 0.4 mg by mouth  at bedtime. 05/02/16   [provider]  torsemide (DEMADEX) 10 MG tablet Take 10 mg by mouth daily. Patient not taking: Reported on 03/12/2024    [provider]     Family History  Problem Relation Age of Onset   Cancer Mother    Diabetes Mother    Hypertension Mother    Coronary artery disease Mother    Hyperlipidemia Sister    Hypertension Sister     Social History   Socioeconomic History   Marital status: Single    Spouse name: Not on file   Number of  children: Not on file   Years of education: Not on file   Highest education level: Not on file  Occupational History   Not on file  Tobacco Use   Smoking status: Never   Smokeless tobacco: Never  Vaping Use   Vaping status: Never Used  Substance and Sexual Activity   Alcohol use: No   Drug use: No   Sexual activity: Not on file  Other Topics Concern   Not on file  Social History Narrative   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 Never married. Took college classes at [employer] --.got a Personnel officer degree from [employer]   Social Drivers of Health   Financial Resource Strain: Low Risk  (07/21/2022)   Received from [facility]   Overall Financial Resource Strain (CARDIA)    Difficulty of Paying Living Expenses: Not hard at all  Food Insecurity: Low Risk  (03/09/2023)   Received from [facility]   Hunger Vital Sign    Within the past 12 months, you worried that your food would run out before you got money to buy more: Never true    Within the past 12 months, the food you bought just didn't last and you didn't have money to get more. : Never true  Transportation Needs: Not on file (03/09/2023)  Physical Activity: Not on file  Stress: Not on file  Social Connections: Not on file       Review of Systems  Vital Signs:   Advance Care Plan: No documents on file  Physical Exam  Imaging: CT CHEST ABDOMEN PELVIS WO CONTRAST Result Date: 03/22/2024 CLINICAL DATA:  History of small-bowel cancer. Renal cell carcinoma. Prostate cancer. Outside CT with periaortic lymphadenopathy and omental/peritoneal carcinomatosis. Omental biopsy positive for carcinoma. * Tracking Code: BO * EXAM: CT CHEST, ABDOMEN AND PELVIS WITHOUT CONTRAST TECHNIQUE: Multidetector CT imaging of the chest, abdomen and pelvis was performed following the standard protocol without IV contrast. RADIATION DOSE REDUCTION: This exam was performed according to the departmental  dose-optimization program which includes automated exposure control, adjustment of the mA and/or kV according to patient size and/or use of iterative reconstruction technique. COMPARISON:  Outside abdominopelvic CT of 02/13/2024. Outside chest CT of 02/14/2024. Reports not available. FINDINGS: CT CHEST FINDINGS Mild motion degradation throughout. Cardiovascular: Aortic atherosclerosis. Mild cardiomegaly, without pericardial effusion. Lad coronary artery calcification. Mediastinum/Nodes: No mediastinal or hilar adenopathy, given limitations of unenhanced CT. Lungs/Pleura: Tiny left pleural effusion, new. Bibasilar subsegmental atelectasis and scarring. A 3 mm superior segment right lower lobe pulmonary nodule on 56/3 is similar. Musculoskeletal: Included within the abdomen pelvic section. CT ABDOMEN PELVIS FINDINGS Hepatobiliary: Normal noncontrast appearance of the liver. Mild motion continuing into the upper abdomen. Cholecystectomy, without biliary ductal dilatation. Pancreas: Pancreatic fatty replacement vomiting the head and uncinate process. Spleen: Normal in size, without focal abnormality. Adrenals/Urinary Tract:  Normal left adrenal gland. Right nephrectomy, without local recurrence. Left renal cortical thinning without dominant mass, stone, or hydronephrosis. No bladder calculi. Stomach/Bowel: Normal stomach, without wall thickening. Colonic stool burden suggests constipation. No bowel obstruction. Vascular/Lymphatic: Advanced aortic and branch vessel atherosclerosis. Left periaortic adenopathy. Index left periaortic node of 1.4 cm on 64/2. Compare 1.5 cm on 02/13/2024 (when remeasured). A preaortic node measures 1.0 cm on 64/2 versus 9 mm on the prior (when remeasured). No pelvic sidewall adenopathy. Reproductive: Normal prostate. Other: Increase in small volume abdominal and upper pelvic ascites. Omental thickening and nodularity again identified. An index area of thickening within the left abdominopelvic  omentum measures 4.0 cm on 63/2 versus 3.9 cm on the prior exam when measured in a similar fashion. No free intraperitoneal air. Musculoskeletal: Diffuse sclerotic osseous metastasis are similar in severity and distribution. IMPRESSION: 1. Degraded exam secondary to mild motion and lack of oral/IV contrast. 2. Similar abdominal retroperitoneal nodal metastasis. 1 node measures minimally larger and the other minimally smaller. 3. Similar omental/peritoneal metastasis with mild increase in small volume abdominopelvic ascites. 4. Similar diffuse osseous metastasis. 5. New tiny left pleural effusion. 6.  No acute process or evidence of metastatic disease in the chest. 7. Incidental findings, including: Coronary artery atherosclerosis. Aortic Atherosclerosis (ICD10-I70.0). Possible constipation. Electronically Signed   By: Rockey Kilts M.D.   On: 03/22/2024 14:57   CT Biopsy Result Date: 03/07/2024 INDICATION: The patient has a history of prostate, kidney, and small-bowel malignancies with new omental thickening and lymphadenopathy. EXAM: CT-guided omental biopsy TECHNIQUE: Multidetector CT imaging of the abdomen was performed following the standard protocol without IV contrast. RADIATION DOSE REDUCTION: This exam was performed according to the departmental dose-optimization program which includes automated exposure control, adjustment of the mA and/or kV according to patient size and/or use of iterative reconstruction technique. MEDICATIONS: None. ANESTHESIA/SEDATION: Moderate (conscious) sedation was employed during this procedure. A total of Versed  1 mg and Fentanyl  50 mcg was administered intravenously by the radiology nurse. Total intra-service moderate Sedation Time: 13 minutes. The patient's level of consciousness and vital signs were monitored continuously by radiology nursing throughout the procedure under my direct supervision. COMPLICATIONS: None immediate. PROCEDURE: Informed written consent was obtained  from the patient after a thorough discussion of the procedural risks, benefits and alternatives. All questions were addressed. Maximal Sterile Barrier Technique was utilized including caps, mask, sterile gowns, sterile gloves, sterile drape, hand hygiene and skin antiseptic. A timeout was performed prior to the initiation of the procedure. With patient in a supine position, radiopaque markers were placed on the patient's skin and the abdomen was evaluated with CT. Images were reviewed in the skin was marked. The patient's abdomen was then prepped and draped in usual sterile fashion. Local anesthesia was achieved with 1% lidocaine . Measurements were obtained on the initial imaging. A small incision was made in the patient's left upper quadrant. Introducer needle was then advanced through the incision into the abdominal cavity. Repeat imaging was performed. To 3 cm samples were then obtained of the thickened omentum in this region. The needle/introducer and biopsy BioPince were removed the patient. Sterile dressing was applied. Samples were sent to pathology. IMPRESSION: Satisfactory core needle biopsy of the omentum in the left upper quadrant. Electronically Signed   By: Cordella Banner   On: 03/07/2024 12:51    Labs:  CBC: Recent Labs    02/22/24 1123 03/07/24 0835 03/12/24 1021 03/28/24 1342  WBC 7.1 9.2 6.8 10.3  HGB 8.2* 8.4* 8.4*  8.6*  HCT 28.1* 28.9* 28.0* 29.2*  PLT 241 247 201 247    COAGS: Recent Labs    03/07/24 0835  INR 1.1    BMP: Recent Labs    02/06/24 1106 02/22/24 1123 03/12/24 1021 03/28/24 1342  NA 137 139 140 138  K 3.0* 3.4* 3.9 4.2  CL 97* 106 107 108  CO2 22 18* 20* 18*  GLUCOSE 334* 172* 139* 221*  BUN 52* 45* 29* 39*  CALCIUM  9.6 9.6 9.6 9.9  CREATININE 3.55* 2.63* 2.00* 2.12*  GFRNONAA 18* 26* 36* 34*    LIVER FUNCTION TESTS: Recent Labs    02/06/24 1106 02/22/24 1123 03/12/24 1021 03/28/24 1342  BILITOT 0.5 0.4 0.4 0.3  AST 23 42* 22 19   ALT 17 36 15 13  ALKPHOS 83 71 65 72  PROT 6.8 7.0 7.0 7.4  ALBUMIN 3.8 3.7 3.9 4.1    TUMOR MARKERS: Recent Labs    02/22/24 1123 03/28/24 1342  CEA 5.86* 4.18    Assessment and Plan: 68 y.o. 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  He is scheduled today for Port-A-Cath placement to assist with palliative chemotherapy.Risks and benefits of image guided port-a-catheter placement was discussed with the patient including, but not limited to bleeding, infection, pneumothorax, or fibrin sheath development and need for additional procedures.  All of the patient's questions were answered, patient is agreeable to proceed. Consent signed and in chart.  Patient known to IR team from right renal mass biopsy in 2023 and omental biopsy on 03/07/2024  Thank you for allowing our service to participate in Ahmar Pickrell 's care.  Electronically Signed: D. Franky Rakers, PA-C   04/05/2024, 5:07 PM      I spent a total of    15 Minutes in face to face in clinical consultation, greater than 50% of which was counseling/coordinating care for Port-A-Cath placement

## 2024-04-07 ENCOUNTER — Other Ambulatory Visit: Payer: Self-pay | Admitting: Oncology

## 2024-04-07 DIAGNOSIS — C179 Malignant neoplasm of small intestine, unspecified: Secondary | ICD-10-CM

## 2024-04-08 ENCOUNTER — Encounter (HOSPITAL_COMMUNITY): Payer: Self-pay

## 2024-04-08 ENCOUNTER — Ambulatory Visit (HOSPITAL_COMMUNITY)
Admission: RE | Admit: 2024-04-08 | Discharge: 2024-04-08 | Disposition: A | Discharge status: Home / Self Care | Source: Ambulatory Visit | Attending: Oncology

## 2024-04-08 ENCOUNTER — Ambulatory Visit (HOSPITAL_COMMUNITY)
Admission: RE | Admit: 2024-04-08 | Discharge: 2024-04-08 | Disposition: A | Discharge status: Home / Self Care | Source: Ambulatory Visit | Attending: Oncology | Admitting: Oncology

## 2024-04-08 DIAGNOSIS — Z7984 Long term (current) use of oral hypoglycemic drugs: Secondary | ICD-10-CM | POA: Insufficient documentation

## 2024-04-08 DIAGNOSIS — C649 Malignant neoplasm of unspecified kidney, except renal pelvis: Secondary | ICD-10-CM | POA: Insufficient documentation

## 2024-04-08 DIAGNOSIS — I10 Essential (primary) hypertension: Secondary | ICD-10-CM | POA: Diagnosis not present

## 2024-04-08 DIAGNOSIS — C61 Malignant neoplasm of prostate: Secondary | ICD-10-CM | POA: Insufficient documentation

## 2024-04-08 DIAGNOSIS — E119 Type 2 diabetes mellitus without complications: Secondary | ICD-10-CM | POA: Insufficient documentation

## 2024-04-08 DIAGNOSIS — C179 Malignant neoplasm of small intestine, unspecified: Secondary | ICD-10-CM | POA: Insufficient documentation

## 2024-04-08 DIAGNOSIS — E785 Hyperlipidemia, unspecified: Secondary | ICD-10-CM | POA: Diagnosis not present

## 2024-04-08 HISTORY — PX: IR IMAGING GUIDED PORT INSERTION: IMG5740

## 2024-04-08 MED ORDER — MIDAZOLAM HCL 2 MG/2ML IJ SOLN
INTRAMUSCULAR | Status: AC | PRN
  Administered 2024-04-08: 1 mg via INTRAVENOUS

## 2024-04-08 MED ORDER — HEPARIN SOD (PORK) LOCK FLUSH 100 UNIT/ML IV SOLN
INTRAVENOUS | Status: AC
  Filled 2024-04-08: qty 5

## 2024-04-08 MED ORDER — FENTANYL CITRATE (PF) 100 MCG/2ML IJ SOLN
INTRAMUSCULAR | Status: AC
  Filled 2024-04-08: qty 2

## 2024-04-08 MED ORDER — SODIUM CHLORIDE 0.9 % IV SOLN: INTRAVENOUS | Status: DC

## 2024-04-08 MED ORDER — LIDOCAINE-EPINEPHRINE 1 %-1:100000 IJ SOLN
INTRAMUSCULAR | Status: AC
Start: 2024-04-08 — End: 2024-04-08
  Filled 2024-04-08: qty 1

## 2024-04-08 MED ORDER — FENTANYL CITRATE (PF) 100 MCG/2ML IJ SOLN
INTRAMUSCULAR | Status: AC | PRN
  Administered 2024-04-08: 50 ug via INTRAVENOUS

## 2024-04-08 MED ORDER — MIDAZOLAM HCL 2 MG/2ML IJ SOLN
INTRAMUSCULAR | Status: AC
  Filled 2024-04-08: qty 2

## 2024-04-08 MED ORDER — HEPARIN SOD (PORK) LOCK FLUSH 100 UNIT/ML IV SOLN
500.0000 [IU] | Freq: Once | INTRAVENOUS | Status: AC
  Administered 2024-04-08: 500 [IU] via INTRAVENOUS

## 2024-04-08 MED ORDER — LIDOCAINE-EPINEPHRINE 1 %-1:100000 IJ SOLN
20.0000 mL | Freq: Once | INTRAMUSCULAR | Status: AC
  Administered 2024-04-08: 16 mL via INTRADERMAL

## 2024-04-08 NOTE — Discharge Instructions (Signed)
 For questions /concerns may call Interventional Radiology at [phone] or  Interventional Radiology clinic [phone]   You may remove your dressing and shower tomorrow afternoon  DO NOT use EMLA cream for 2 weeks after port placement as the cream will remove surgical glue on your incision.                                                                   Implanted Port Insertion, Care After The following information offers guidance on how to care for yourself after your procedure. Your health care provider may also give you more specific instructions. If you have problems or questions, contact your health care provider. What can I expect after the procedure? After the procedure, it is common to have: Discomfort at the port insertion site. Bruising on the skin over the port. This should improve over 3-4 days. Follow these instructions at home: [facility] care After your port is placed, you will get a manufacturer's information card. The card has information about your port. Keep this card with you at all times. Take care of the port as told by your health care provider. Ask your health care provider if you or a family member can get training for taking care of the port at home. A home health care nurse will be be available to help care for the port. Make sure to remember what type of port you have. Incision care  Follow instructions from your health care provider about how to take care of your port insertion site. Make sure you: Wash your hands with soap and water for at least 20 seconds before and after you change your bandage (dressing). If soap and water are not available, use hand sanitizer. Change your dressing as told by your health care provider. Leave stitches (sutures), skin glue, or adhesive strips in place. These skin closures may need to stay in place for 2 weeks or longer. If adhesive strip edges start to loosen and curl up, you may trim the loose edges. Do not remove adhesive  strips completely unless your health care provider tells you to do that. Check your port insertion site every day for signs of infection. Check for: Redness, swelling, or pain. Fluid or blood. Warmth. Pus or a bad smell. Activity Return to your normal activities as told by your health care provider. Ask your health care provider what activities are safe for you. You may have to avoid lifting. Ask your health care provider how much you can safely lift. General instructions Take over-the-counter and prescription medicines only as told by your health care provider. Do not take baths, swim, or use a hot tub until your health care provider approves. Ask your health care provider if you may take showers. You may only be allowed to take sponge baths. If you were given a sedative during the procedure, it can affect you for several hours. Do not drive or operate machinery until your health care provider says that it is safe. Wear a medical alert bracelet in case of an emergency. This will tell any health care providers that you have a port. Keep all follow-up visits. This is important. Contact a health care provider if: You cannot flush your port with saline as directed, or you cannot draw blood  from the port. You have a fever or chills. You have redness, swelling, or pain around your port insertion site. You have fluid or blood coming from your port insertion site. Your port insertion site feels warm to the touch. You have pus or a bad smell coming from the port insertion site. Get help right away if: You have chest pain or shortness of breath. You have bleeding from your port that you cannot control. These symptoms may be an emergency. Get help right away. Call 911. Do not wait to see if the symptoms will go away. Do not drive yourself to the hospital. Summary Take care of the port as told by your health care provider. Keep the manufacturer's information card with you at all times. Change your  dressing as told by your health care provider. Contact a health care provider if you have a fever or chills or if you have redness, swelling, or pain around your port insertion site. Keep all follow-up visits. This information is not intended to replace advice given to you by your health care provider. Make sure you discuss any questions you have with your health care provider. Document Revised: 03/16/2021 Document Reviewed: 03/16/2021 Elsevier Patient Education  2024 [employer] Inc.                                                         Moderate Conscious Sedation, Adult, Care After After the procedure, it is common to have: Sleepiness for a few hours. Impaired judgment for a few hours. Trouble with balance. Nausea or vomiting if you eat too soon. Follow these instructions at home: For the time period you were told by your health care provider:  Rest. Do not participate in activities where you could fall or become injured. Do not drive or use machinery. Do not drink alcohol. Do not take sleeping pills or medicines that cause drowsiness. Do not make important decisions or sign legal documents. Do not take care of children on your own. Eating and drinking Follow instructions from your health care provider about what you may eat and drink. Drink enough fluid to keep your urine pale yellow. If you vomit: Drink clear fluids slowly and in small amounts as you are able. Clear fluids include water, ice chips, low-calorie sports drinks, and fruit juice that has water added to it (diluted fruit juice). Eat light and bland foods in small amounts as you are able. These foods include bananas, applesauce, rice, lean meats, toast, and crackers. General instructions Take over-the-counter and prescription medicines only as told by your health care provider. Have a responsible adult stay with you for the time you are told. Do not use any products that contain nicotine or tobacco. These products  include cigarettes, chewing tobacco, and vaping devices, such as e-cigarettes. If you need help quitting, ask your health care provider. Return to your normal activities as told by your health care provider. Ask your health care provider what activities are safe for you. Your health care provider may give you more instructions. Make sure you know what you can and cannot do. Contact a health care provider if: You are still sleepy or having trouble with balance after 24 hours. You feel light-headed. You vomit every time you eat or drink. You get a rash. You have a fever. You have redness  or swelling around the IV site. Get help right away if: You have trouble breathing. You start to feel confused at home. These symptoms may be an emergency. Get help right away. Call 911. Do not wait to see if the symptoms will go away. Do not drive yourself to the hospital. This information is not intended to replace advice given to you by your health care provider. Make sure you discuss any questions you have with your health care provider. Document Revised: 03/28/2022 Document Reviewed: 03/28/2022 Elsevier Patient Education  2024 [employer].

## 2024-04-08 NOTE — Procedures (Signed)
Interventional Radiology Procedure Note  Procedure: RT internal jugular POWER PORT    Complications: None  Estimated Blood Loss:  MIN  Findings: TIP SVCRA    M. TREVOR Levaeh Vice, MD    

## 2024-04-09 ENCOUNTER — Inpatient Hospital Stay

## 2024-04-09 ENCOUNTER — Other Ambulatory Visit: Payer: Self-pay

## 2024-04-09 ENCOUNTER — Inpatient Hospital Stay: Admitting: Nurse Practitioner

## 2024-04-09 ENCOUNTER — Encounter: Payer: Self-pay | Admitting: Nurse Practitioner

## 2024-04-09 VITALS — BP 129/62 | HR 82 | Temp 97.8°F | Resp 18 | Ht 65.0 in | Wt 155.0 lb

## 2024-04-09 VITALS — BP 132/72 | HR 79 | Temp 98.0°F | Resp 18

## 2024-04-09 DIAGNOSIS — C179 Malignant neoplasm of small intestine, unspecified: Secondary | ICD-10-CM

## 2024-04-09 DIAGNOSIS — D649 Anemia, unspecified: Secondary | ICD-10-CM

## 2024-04-09 DIAGNOSIS — Z5111 Encounter for antineoplastic chemotherapy: Secondary | ICD-10-CM | POA: Diagnosis not present

## 2024-04-09 LAB — CBC WITH DIFFERENTIAL (CANCER CENTER ONLY)
Abs Immature Granulocytes: 0.06 K/uL (ref 0.00–0.07)
Basophils Absolute: 0 K/uL (ref 0.0–0.1)
Basophils Relative: 0 %
Eosinophils Absolute: 0 K/uL (ref 0.0–0.5)
Eosinophils Relative: 0 %
HCT: 25.4 % — ABNORMAL LOW (ref 39.0–52.0)
Hemoglobin: 7.5 g/dL — ABNORMAL LOW (ref 13.0–17.0)
Immature Granulocytes: 1 %
Lymphocytes Relative: 4 %
Lymphs Abs: 0.3 K/uL — ABNORMAL LOW (ref 0.7–4.0)
MCH: 29.4 pg (ref 26.0–34.0)
MCHC: 29.5 g/dL — ABNORMAL LOW (ref 30.0–36.0)
MCV: 99.6 fL (ref 80.0–100.0)
Monocytes Absolute: 0.5 K/uL (ref 0.1–1.0)
Monocytes Relative: 6 %
Neutro Abs: 7.7 K/uL (ref 1.7–7.7)
Neutrophils Relative %: 89 %
Platelet Count: 218 K/uL (ref 150–400)
RBC: 2.55 MIL/uL — ABNORMAL LOW (ref 4.22–5.81)
RDW: 16 % — ABNORMAL HIGH (ref 11.5–15.5)
WBC Count: 8.6 K/uL (ref 4.0–10.5)
nRBC: 0 % (ref 0.0–0.2)

## 2024-04-09 LAB — CMP (CANCER CENTER ONLY)
ALT: 15 U/L (ref 0–44)
AST: 23 U/L (ref 15–41)
Albumin: 3.9 g/dL (ref 3.5–5.0)
Alkaline Phosphatase: 76 U/L (ref 38–126)
Anion gap: 14 (ref 5–15)
BUN: 36 mg/dL — ABNORMAL HIGH (ref 8–23)
CO2: 16 mmol/L — ABNORMAL LOW (ref 22–32)
Calcium: 9.6 mg/dL (ref 8.9–10.3)
Chloride: 109 mmol/L (ref 98–111)
Creatinine: 2.53 mg/dL — ABNORMAL HIGH (ref 0.61–1.24)
GFR, Estimated: 27 mL/min — ABNORMAL LOW (ref >60)
Glucose, Bld: 224 mg/dL — ABNORMAL HIGH (ref 70–99)
Potassium: 3.5 mmol/L (ref 3.5–5.1)
Sodium: 139 mmol/L (ref 135–145)
Total Bilirubin: 0.4 mg/dL (ref 0.0–1.2)
Total Protein: 7 g/dL (ref 6.5–8.1)

## 2024-04-09 LAB — CEA (ACCESS): CEA (CHCC): 4.56 ng/mL (ref 0.00–5.00)

## 2024-04-09 MED ORDER — SODIUM CHLORIDE 0.9 % IV SOLN: Freq: Once | INTRAVENOUS | Status: DC | PRN

## 2024-04-09 MED ORDER — PALONOSETRON HCL INJECTION 0.25 MG/5ML
0.2500 mg | Freq: Once | INTRAVENOUS | Status: AC
  Administered 2024-04-09: 0.25 mg via INTRAVENOUS
  Filled 2024-04-09: qty 5

## 2024-04-09 MED ORDER — FLUOROURACIL CHEMO INJECTION 500 MG/10ML
300.0000 mg/m2 | Freq: Once | INTRAVENOUS | Status: AC
  Administered 2024-04-09: 500 mg via INTRAVENOUS
  Filled 2024-04-09: qty 10

## 2024-04-09 MED ORDER — SODIUM CHLORIDE 0.9 % IV SOLN
1800.0000 mg/m2 | INTRAVENOUS | Status: DC
  Administered 2024-04-09: 3500 mg via INTRAVENOUS
  Filled 2024-04-09: qty 50

## 2024-04-09 MED ORDER — DEXAMETHASONE SODIUM PHOSPHATE 10 MG/ML IJ SOLN
10.0000 mg | Freq: Once | INTRAMUSCULAR | Status: AC
  Administered 2024-04-09: 10 mg via INTRAVENOUS
  Filled 2024-04-09: qty 1

## 2024-04-09 MED ORDER — OXALIPLATIN CHEMO INJECTION 100 MG/20ML
60.0000 mg/m2 | Freq: Once | INTRAVENOUS | Status: AC
  Administered 2024-04-09: 100 mg via INTRAVENOUS
  Filled 2024-04-09: qty 20

## 2024-04-09 MED ORDER — SODIUM CHLORIDE 0.9% FLUSH: 10.0000 mL | INTRAVENOUS | Status: DC | PRN

## 2024-04-09 MED ORDER — FAMOTIDINE IN NACL 20-0.9 MG/50ML-% IV SOLN
20.0000 mg | Freq: Once | INTRAVENOUS | Status: AC | PRN
  Administered 2024-04-09: 20 mg via INTRAVENOUS

## 2024-04-09 MED ORDER — LEUCOVORIN CALCIUM INJECTION 350 MG
300.0000 mg/m2 | Freq: Once | INTRAVENOUS | Status: AC
  Administered 2024-04-09: 538 mg via INTRAVENOUS
  Filled 2024-04-09: qty 26.9

## 2024-04-09 MED ORDER — DEXTROSE 5 % IV SOLN: INTRAVENOUS | Status: DC

## 2024-04-09 NOTE — Progress Notes (Signed)
 Patient seen by Olam Ned NP today  Vitals are within treatment parameters:Yes   Labs are within treatment parameters: Yes creatinine 2.53 HgB 7.5 its ok to proceed   Treatment plan has been signed: Yes   Per physician team, Patient is ready for treatment and there are NO modifications to the treatment plan.

## 2024-04-09 NOTE — Patient Instructions (Addendum)
 [facility] - A DEPT OF [facility]  Discharge Instructions: Thank you for choosing [facility] to provide your oncology and hematology care.   If you have a lab appointment with the Cancer Center, please go directly to the Cancer Center and check in at the registration area.   Wear comfortable clothing and clothing appropriate for easy access to any Portacath or PICC line.   We strive to give you quality time with your provider. You may need to reschedule your appointment if you arrive late (15 or more minutes).  Arriving late affects you and other patients whose appointments are after yours.  Also, if you miss three or more appointments without notifying the office, you may be dismissed from the clinic at the provider's discretion.      For prescription refill requests, have your pharmacy contact our office and allow 72 hours for refills to be completed.    Today you received the following chemotherapy and/or immunotherapy agents: oxaliplatin , leucovorin  and fluorouracil .      To help prevent nausea and vomiting after your treatment, we encourage you to take your nausea medication as directed.  BELOW ARE SYMPTOMS THAT SHOULD BE REPORTED IMMEDIATELY: *FEVER GREATER THAN 100.4 F (38 C) OR HIGHER *CHILLS OR SWEATING *NAUSEA AND VOMITING THAT IS NOT CONTROLLED WITH YOUR NAUSEA MEDICATION *UNUSUAL SHORTNESS OF BREATH *UNUSUAL BRUISING OR BLEEDING *URINARY PROBLEMS (pain or burning when urinating, or frequent urination) *BOWEL PROBLEMS (unusual diarrhea, constipation, pain near the anus) TENDERNESS IN MOUTH AND THROAT WITH OR WITHOUT PRESENCE OF ULCERS (sore throat, sores in mouth, or a toothache) UNUSUAL RASH, SWELLING OR PAIN  UNUSUAL VAGINAL DISCHARGE OR ITCHING   Items with * indicate a potential emergency and should be followed up as soon as possible or go to the Emergency Department if any problems should occur.  Please show the  CHEMOTHERAPY ALERT CARD or IMMUNOTHERAPY ALERT CARD at check-in to the Emergency Department and triage nurse.  Should you have questions after your visit or need to cancel or reschedule your appointment, please contact [facility] - A DEPT OF [facility]  Dept: [phone]  and follow the prompts.  Office hours are 8:00 a.m. to 4:30 p.m. Monday - Friday. Please note that voicemails left after 4:00 p.m. may not be returned until the following business day.  We are closed weekends and major holidays. You have access to a nurse at all times for urgent questions. Please call the main number to the clinic Dept: [phone] and follow the prompts.   For any non-urgent questions, you may also contact your provider using MyChart. We now offer e-Visits for anyone 22 and older to request care online for non-urgent symptoms. For details visit [email or web address].   Also download the MyChart app! Go to the app store, search MyChart, open the app, select [facility], and log in with your MyChart username and password.  The chemotherapy medication bag should finish at 46 hours, 96 hours, or 7 days. For example, if your pump is scheduled for 46 hours and it was put on at 4:00 p.m., it should finish at 2:00 p.m. the day it is scheduled to come off regardless of your appointment time.     Estimated time to finish at 3:30pm on Thursday 7/17. You have a lab appointment at 2:30pm just prior to that (same day).   If the display on your pump reads Low Volume and it is beeping, take  the batteries out of the pump and come to the cancer center for it to be taken off.   If the pump alarms go off prior to the pump reading Low Volume then call [phone] and someone can assist you.  If the plunger comes out and the chemotherapy medication is leaking out, please use your home chemo spill kit to clean up the spill. Do NOT use paper towels or other household products.  If you have  problems or questions regarding your pump, please call either [phone] (24 hours a day) or the cancer center Monday-Friday 8:00 a.m.- 4:30 p.m. at the clinic number and we will assist you. If you are unable to get assistance, then go to the nearest Emergency Department and ask the staff to contact the IV team for assistance.   _____________________________________________  Fluorouracil  Injection What is this medication? FLUOROURACIL  (flure oh YOOR a sil) treats some types of cancer. It works by slowing down the growth of cancer cells. This medicine may be used for other purposes; ask your health care provider or pharmacist if you have questions. COMMON BRAND NAME(S): Adrucil  What should I tell my care team before I take this medication? They need to know if you have any of these conditions: Blood disorders Dihydropyrimidine dehydrogenase (DPD) deficiency Infection, such as chickenpox, cold sores, herpes Kidney disease Liver disease Poor nutrition Recent or ongoing radiation therapy An unusual or allergic reaction to fluorouracil , other medications, foods, dyes, or preservatives If you or your partner are pregnant or trying to get pregnant Breast-feeding How should I use this medication? This medication is injected into a vein. It is administered by your care team in a hospital or clinic setting. Talk to your care team about the use of this medication in children. Special care may be needed. Overdosage: If you think you have taken too much of this medicine contact a poison control center or emergency room at once. NOTE: This medicine is only for you. Do not share this medicine with others. What if I miss a dose? Keep appointments for follow-up doses. It is important not to miss your dose. Call your care team if you are unable to keep an appointment. What may interact with this medication? Do not take this medication with any of the following: Live virus vaccines This medication  may also interact with the following: Medications that treat or prevent blood clots, such as warfarin, enoxaparin, dalteparin This list may not describe all possible interactions. Give your health care provider a list of all the medicines, herbs, non-prescription drugs, or dietary supplements you use. Also tell them if you smoke, drink alcohol, or use illegal drugs. Some items may interact with your medicine. What should I watch for while using this medication? Your condition will be monitored carefully while you are receiving this medication. This medication may make you feel generally unwell. This is not uncommon as chemotherapy can affect healthy cells as well as cancer cells. Report any side effects. Continue your course of treatment even though you feel ill unless your care team tells you to stop. In some cases, you may be given additional medications to help with side effects. Follow all directions for their use. This medication may increase your risk of getting an infection. Call your care team for advice if you get a fever, chills, sore throat, or other symptoms of a cold or flu. Do not treat yourself. Try to avoid being around people who are sick. This medication may increase your risk to bruise  or bleed. Call your care team if you notice any unusual bleeding. Be careful brushing or flossing your teeth or using a toothpick because you may get an infection or bleed more easily. If you have any dental work done, tell your dentist you are receiving this medication. Avoid taking medications that contain aspirin, acetaminophen , ibuprofen, naproxen, or ketoprofen unless instructed by your care team. These medications may hide a fever. Do not treat diarrhea with over the counter products. Contact your care team if you have diarrhea that lasts more than 2 days or if it is severe and watery. This medication can make you more sensitive to the sun. Keep out of the sun. If you cannot avoid being in the sun,  wear protective clothing and sunscreen. Do not use sun lamps, tanning beds, or tanning booths. Talk to your care team if you or your partner wish to become pregnant or think you might be pregnant. This medication can cause serious birth defects if taken during pregnancy and for 3 months after the last dose. A reliable form of contraception is recommended while taking this medication and for 3 months after the last dose. Talk to your care team about effective forms of contraception. Do not father a child while taking this medication and for 3 months after the last dose. Use a condom while having sex during this time period. Do not breastfeed while taking this medication. This medication may cause infertility. Talk to your care team if you are concerned about your fertility. What side effects may I notice from receiving this medication? Side effects that you should report to your care team as soon as possible: Allergic reactions--skin rash, itching, hives, swelling of the face, lips, tongue, or throat Heart attack--pain or tightness in the chest, shoulders, arms, or jaw, nausea, shortness of breath, cold or clammy skin, feeling faint or lightheaded Heart failure--shortness of breath, swelling of the ankles, feet, or hands, sudden weight gain, unusual weakness or fatigue Heart rhythm changes--fast or irregular heartbeat, dizziness, feeling faint or lightheaded, chest pain, trouble breathing High ammonia level--unusual weakness or fatigue, confusion, loss of appetite, nausea, vomiting, seizures Infection--fever, chills, cough, sore throat, wounds that don't heal, pain or trouble when passing urine, general feeling of discomfort or being unwell Low red blood cell level--unusual weakness or fatigue, dizziness, headache, trouble breathing Pain, tingling, or numbness in the hands or feet, muscle weakness, change in vision, confusion or trouble speaking, loss of balance or coordination, trouble walking,  seizures Redness, swelling, and blistering of the skin over hands and feet Severe or prolonged diarrhea Unusual bruising or bleeding Side effects that usually do not require medical attention (report to your care team if they continue or are bothersome): Dry skin Headache Increased tears Nausea Pain, redness, or swelling with sores inside the mouth or throat Sensitivity to light Vomiting This list may not describe all possible side effects. Call your doctor for medical advice about side effects. You may report side effects to FDA at 1-800-FDA-1088. Where should I keep my medication? This medication is given in a hospital or clinic. It will not be stored at home. NOTE: This sheet is a summary. It may not cover all possible information. If you have questions about this medicine, talk to your doctor, pharmacist, or health care provider.  2024 Elsevier/Gold Standard (2022-01-18 00:00:00) _____________________________________________________________  Oxaliplatin  Injection What is this medication? OXALIPLATIN  (ox AL i PLA tin) treats colorectal cancer. It works by slowing down the growth of cancer cells. This medicine may  be used for other purposes; ask your health care provider or pharmacist if you have questions. COMMON BRAND NAME(S): Eloxatin  What should I tell my care team before I take this medication? They need to know if you have any of these conditions: Heart disease History of irregular heartbeat or rhythm Liver disease Low blood cell levels (white cells, red cells, and platelets) Lung or breathing disease, such as asthma Take medications that treat or prevent blood clots Tingling of the fingers, toes, or other nerve disorder An unusual or allergic reaction to oxaliplatin , other medications, foods, dyes, or preservatives If you or your partner are pregnant or trying to get pregnant Breast-feeding How should I use this medication? This medication is injected into a vein. It is  given by your care team in a hospital or clinic setting. Talk to your care team about the use of this medication in children. Special care may be needed. Overdosage: If you think you have taken too much of this medicine contact a poison control center or emergency room at once. NOTE: This medicine is only for you. Do not share this medicine with others. What if I miss a dose? Keep appointments for follow-up doses. It is important not to miss a dose. Call your care team if you are unable to keep an appointment. What may interact with this medication? Do not take this medication with any of the following: Cisapride Dronedarone Pimozide Thioridazine This medication may also interact with the following: Aspirin and aspirin-like medications Certain medications that treat or prevent blood clots, such as warfarin, apixaban , dabigatran, and rivaroxaban Cisplatin Cyclosporine Diuretics Medications for infection, such as acyclovir, adefovir, amphotericin B, bacitracin, cidofovir, foscarnet, ganciclovir, gentamicin, pentamidine, vancomycin NSAIDs, medications for pain and inflammation, such as ibuprofen or naproxen Other medications that cause heart rhythm changes Pamidronate Zoledronic  acid This list may not describe all possible interactions. Give your health care provider a list of all the medicines, herbs, non-prescription drugs, or dietary supplements you use. Also tell them if you smoke, drink alcohol, or use illegal drugs. Some items may interact with your medicine. What should I watch for while using this medication? Your condition will be monitored carefully while you are receiving this medication. You may need blood work while taking this medication. This medication may make you feel generally unwell. This is not uncommon as chemotherapy can affect healthy cells as well as cancer cells. Report any side effects. Continue your course of treatment even though you feel ill unless your care team  tells you to stop. This medication may increase your risk of getting an infection. Call your care team for advice if you get a fever, chills, sore throat, or other symptoms of a cold or flu. Do not treat yourself. Try to avoid being around people who are sick. Avoid taking medications that contain aspirin, acetaminophen , ibuprofen, naproxen, or ketoprofen unless instructed by your care team. These medications may hide a fever. Be careful brushing or flossing your teeth or using a toothpick because you may get an infection or bleed more easily. If you have any dental work done, tell your dentist you are receiving this medication. This medication can make you more sensitive to cold. Do not drink cold drinks or use ice. Cover exposed skin before coming in contact with cold temperatures or cold objects. When out in cold weather wear warm clothing and cover your mouth and nose to warm the air that goes into your lungs. Tell your care team if you get sensitive to  the cold. Talk to your care team if you or your partner are pregnant or think either of you might be pregnant. This medication can cause serious birth defects if taken during pregnancy and for 9 months after the last dose. A negative pregnancy test is required before starting this medication. A reliable form of contraception is recommended while taking this medication and for 9 months after the last dose. Talk to your care team about effective forms of contraception. Do not father a child while taking this medication and for 6 months after the last dose. Use a condom while having sex during this time period. Do not breastfeed while taking this medication and for 3 months after the last dose. This medication may cause infertility. Talk to your care team if you are concerned about your fertility. What side effects may I notice from receiving this medication? Side effects that you should report to your care team as soon as possible: Allergic  reactions--skin rash, itching, hives, swelling of the face, lips, tongue, or throat Bleeding--bloody or black, tar-like stools, vomiting blood or brown material that looks like coffee grounds, red or dark brown urine, small red or purple spots on skin, unusual bruising or bleeding Dry cough, shortness of breath or trouble breathing Heart rhythm changes--fast or irregular heartbeat, dizziness, feeling faint or lightheaded, chest pain, trouble breathing Infection--fever, chills, cough, sore throat, wounds that don't heal, pain or trouble when passing urine, general feeling of discomfort or being unwell Liver injury--right upper belly pain, loss of appetite, nausea, light-colored stool, dark yellow or brown urine, yellowing skin or eyes, unusual weakness or fatigue Low red blood cell level--unusual weakness or fatigue, dizziness, headache, trouble breathing Muscle injury--unusual weakness or fatigue, muscle pain, dark yellow or brown urine, decrease in amount of urine Pain, tingling, or numbness in the hands or feet Sudden and severe headache, confusion, change in vision, seizures, which may be signs of posterior reversible encephalopathy syndrome (PRES) Unusual bruising or bleeding Side effects that usually do not require medical attention (report to your care team if they continue or are bothersome): Diarrhea Nausea Pain, redness, or swelling with sores inside the mouth or throat Unusual weakness or fatigue Vomiting This list may not describe all possible side effects. Call your doctor for medical advice about side effects. You may report side effects to FDA at 1-800-FDA-1088. Where should I keep my medication? This medication is given in a hospital or clinic. It will not be stored at home. NOTE: This sheet is a summary. It may not cover all possible information. If you have questions about this medicine, talk to your doctor, pharmacist, or health care provider.  2024 Elsevier/Gold Standard  (2023-08-25 00:00:00) ______________________________________________________ Leucovorin  Injection What is this medication? LEUCOVORIN  (loo koe VOR in) prevents side effects from certain medications, such as methotrexate. It works by increasing folate levels. This helps protect healthy cells in your body. It may also be used to treat anemia caused by low levels of folate. It can also be used with fluorouracil , a type of chemotherapy, to treat colorectal cancer. It works by increasing the effects of fluorouracil  in the body. This medicine may be used for other purposes; ask your health care provider or pharmacist if you have questions. What should I tell my care team before I take this medication? They need to know if you have any of these conditions: Anemia from low levels of vitamin B12 in the blood An unusual or allergic reaction to leucovorin , folic acid, other medications, foods,  dyes, or preservatives Pregnant or trying to get pregnant Breastfeeding How should I use this medication? This medication is injected into a vein or a muscle. It is given by your care team in a hospital or clinic setting. Talk to your care team about the use of this medication in children. Special care may be needed. Overdosage: If you think you have taken too much of this medicine contact a poison control center or emergency room at once. NOTE: This medicine is only for you. Do not share this medicine with others. What if I miss a dose? Keep appointments for follow-up doses. It is important not to miss your dose. Call your care team if you are unable to keep an appointment. What may interact with this medication? Capecitabine  Fluorouracil  Phenobarbital Phenytoin Primidone Trimethoprim;sulfamethoxazole This list may not describe all possible interactions. Give your health care provider a list of all the medicines, herbs, non-prescription drugs, or dietary supplements you use. Also tell them if you smoke, drink  alcohol, or use illegal drugs. Some items may interact with your medicine. What should I watch for while using this medication? Your condition will be monitored carefully while you are receiving this medication. This medication may increase the side effects of 5-fluorouracil . Tell your care team if you have diarrhea or mouth sores that do not get better or that get worse. What side effects may I notice from receiving this medication? Side effects that you should report to your care team as soon as possible: Allergic reactions--skin rash, itching, hives, swelling of the face, lips, tongue, or throat This list may not describe all possible side effects. Call your doctor for medical advice about side effects. You may report side effects to FDA at 1-800-FDA-1088. Where should I keep my medication? This medication is given in a hospital or clinic. It will not be stored at home. NOTE: This sheet is a summary. It may not cover all possible information. If you have questions about this medicine, talk to your doctor, pharmacist, or health care provider.  2024 Elsevier/Gold Standard (2022-02-15 00:00:00)

## 2024-04-09 NOTE — Progress Notes (Signed)
 At 1410 , 12 minutes into the oxaliplatin  & leucovorin  infusion, pt c/o upper chest burning, tightness and stated it felt a little like heartburn. This was a new symptom since the infusion started. Infusion immediately stopped. NS infusion initiated in the port closest to patient's port.Vital signs obtained and providers Dr. Cloretta and Olam Ned, NP notified. By the time Olam arrived the chest symptoms had subsided. Orders obtained for Pepcid  20mg  IV and then restart oxaliplatin  and leucovorin .Vital signs obtained periodically and noted in flowsheet.  Oxaliplatin  and leucovorin  restarted at 1450. As of 1623, there has been no recurrence of symptoms.   Pt departed with his sisters at 62. Reports feeling well with no symptoms at discharge.

## 2024-04-09 NOTE — Progress Notes (Signed)
 [facility] OFFICE PROGRESS NOTE   Diagnosis: Prostate cancer, renal cell carcinoma, small bowel cancer  INTERVAL HISTORY:   [name] returns as scheduled.  A Port-A-Cath was placed yesterday.  He is scheduled to begin treatment with FOLFOX today.  He denies nausea/vomiting.  No diarrhea.  He takes Colace as needed for periodic constipation.  No abdominal pain.  He has a good appetite.  He noticed a small amount of blood on the toilet tissue after a recent bowel movement.  Otherwise no bleeding.  Energy level is poor.  He has mild dyspnea on exertion.  Persistent cough.  No fever.  Objective:  Vital signs in last 24 hours:  Blood pressure 129/62, pulse (!) 101, temperature 97.8 F (36.6 C), temperature source Temporal, resp. rate 18, height 5' 5 (1.651 m), weight 155 lb (70.3 kg), SpO2 98%.    HEENT: No thrush or ulcers. Resp: Lungs clear bilaterally. Cardio: Regular rate and rhythm. GI: No hepatosplenomegaly. Vascular: Pitting bilateral lower leg edema. Neuro: Alert and oriented. Skin: Ecchymosis surrounding the Port-A-Cath.  Right hand is chronically dry.   Lab Results:  Lab Results  Component Value Date   WBC 10.3 03/28/2024   HGB 8.6 (L) 03/28/2024   HCT 29.2 (L) 03/28/2024   MCV 99.7 03/28/2024   PLT 247 03/28/2024   NEUTROABS 9.4 (H) 03/28/2024    Imaging:  IR IMAGING GUIDED PORT INSERTION Result Date: 04/08/2024 CLINICAL DATA:  SMALL BOWEL CARCINOMA, ACCESS FOR CHEMOTHERAPY EXAM: RIGHT INTERNAL JUGULAR SINGLE LUMEN POWER PORT CATHETER INSERTION Date:  04/08/2024 04/08/2024 4:01 pm Radiologist:  CHRISTELLA. Frederic Specking, MD Guidance:  ULTRASOUND AND FLUOROSCOPIC MEDICATIONS: 1% lidocaine  local with epinephrine  ANESTHESIA/SEDATION: Versed  1.0 mg IV; Fentanyl  50 mcg IV; Moderate Sedation Time:  30 minutes The patient was continuously monitored during the procedure by the interventional radiology nurse under my direct supervision. FLUOROSCOPY: (7 mGy)  COMPLICATIONS: None immediate. CONTRAST:  None. PROCEDURE: Informed consent was obtained from the patient following explanation of the procedure, risks, benefits and alternatives. The patient understands, agrees and consents for the procedure. All questions were addressed. A time out was performed. Maximal barrier sterile technique utilized including caps, mask, sterile gowns, sterile gloves, large sterile drape, hand hygiene, and 2% chlorhexidine  scrub. Under sterile conditions and local anesthesia, right internal jugular micropuncture venous access was performed. Access was performed with ultrasound. Images were obtained for documentation of the patent right internal jugular vein. A guide wire was inserted followed by a transitional dilator. This allowed insertion of a guide wire and catheter into the IVC. Measurements were obtained from the SVC / RA junction back to the right IJ venotomy site. In the right infraclavicular chest, a subcutaneous pocket was created over the second anterior rib. This was done under sterile conditions and local anesthesia. 1% lidocaine  with epinephrine  was utilized for this. A 2.5 cm incision was made in the skin. Blunt dissection was performed to create a subcutaneous pocket over the right pectoralis major muscle. The pocket was flushed with saline vigorously. There was adequate hemostasis. The port catheter was assembled and checked for leakage. The port catheter was secured in the pocket with two retention sutures. The tubing was tunneled subcutaneously to the right venotomy site and inserted into the SVC/RA junction through a valved peel-away sheath. Position was confirmed with fluoroscopy. Images were obtained for documentation. The patient tolerated the procedure well. No immediate complications. Incisions were closed in a two layer fashion with 4 - 0 Vicryl suture. Dermabond was applied  to the skin. The port catheter was accessed, blood was aspirated followed by saline and  heparin  flushes. Needle was removed. A dry sterile dressing was applied. IMPRESSION: Ultrasound and fluoroscopically guided right internal jugular single lumen power port catheter insertion. Tip in the SVC/RA junction. Catheter ready for use. Electronically Signed   By: CHRISTELLA.  Shick M.D.   On: 04/08/2024 16:12    Medications: I have reviewed the patient's current medications.  Assessment/Plan: History of Severe anemia-status post a red cell transfusion 03/04/2016 2.. Prostate cancer Extensive sclerotic bone metastases noted on a CT of the abdomen/pelvis 03/04/2016 and MRI abdomen 03/05/2016 Markedly elevated PSA Lupron  initiated 03/15/2016; Casodex  14 days beginning 03/15/2016 Prostate biopsy 03/18/2016 05/09/2016 PSA significantly improved at 21 Abiraterone  initiated 05/10/2016 PSA less than 0.1 07/10/2018 PSA less than 0.1 08/30/2018 PSA less than 0.1 10/18/2018 PSA less than 0.1 11/29/2018 PSA less than 0.1 on 11/06/2019  PSA less than 0.1 on 03/05/2020 PSA less than 0.1 on 07/06/2020 PSA less than 0.1 08/17/2021 PSA less than 0.1 on 09/30/2021 PSA 0.1 on 11/12/2021 PSA 0.1 on 02/18/2022 PSA 0.2 on 12/09/2022 PSA 0.3 on 04/04/2023  PSA 0.4 on 06/06/2023 PSA 0.5 on 07/05/2023 PSA 0.7 on 08/09/2023 PSA 1.5 on 12/07/2023 PSA 4.7 on 02/06/2024 PSA 6.37 on 02/14/2024 PSA 5.0 on 02/22/2024 PSA 7.3 on 03/28/2024     3.   Symptoms of obstructive uropathy. Improved   4.   Distal duodenal and descending colon masses biopsy of the duodenal mass 03/04/2016 confirmed fragments of small bowel mucosa with focal high-grade dysplasia Biopsy of the descending colon polyps revealed fragments of a tubulovillous adenoma CT 08/29/2017-similar soft tissue thickening in the region of the splenic flexure of the colon suboptimally evaluated due to lack of colonic enteric contrast Referred to Dr. Rollin Referred to [facility] Resection of descending colon polyps 01/31/2018, tubulovillous adenomas with high-grade  dysplasia Attempted endoscopic resection of the fourth segment duodenal polyp on 05/09/2018, firm unresectable lesion, biopsy revealed moderately differentiated adenocarcinoma CT abdomen/pelvis 05/25/2018- heterogeneously enhancing right lower pole renal lesion; diffuse sclerotic osseous metastatic lesions compatible with history of prostate cancer CT chest 05/25/2018- no pulmonary metastasis.  Diffuse osseous metastasis involving the axial and appendicular skeleton. 06/19/2018-sleeve resection of third and fourth portions of duodenum with primary end-to-end anastomosis, feeding gastrojejunostomy tube placement, Dr. Ames; poorly differentiated adenocarcinoma duodenum, 3 cm; tumor invades the muscularis propria; lymphovascular invasion present; negative margins; 4 of 11 involved lymph nodes; soft tissue deposits also seen in the mesentery; all mucosal margins and soft tissue margins negative but 2 of the positive nodes are found in the soft tissue margin submitted for frozen section (pT2 pN2); second mucosal lesion 1.2 cm from the proximal margin, tubular adenoma with focal severe dysplasia Intact expression of mismatch repair proteins by IHC; microsatellite stable Cycle 1 adjuvant Xeloda  07/23/2018 Cycle 2 adjuvant Xeloda  08/13/2018 Cycle 3 adjuvant Xeloda  09/10/2018 (dose reduced to 1000 mg twice daily for 14 days)  Cycle 4 adjuvant Xeloda  10/01/2018 (dose increased to 1500 mg every morning and 1000 mg every afternoon for 14 days) Cycle 5 adjuvant Xeloda  10/22/2018 Cycle 6 adjuvant Xeloda  11/12/2018 Cycle 7 adjuvant Xeloda  12/03/2018 Cycle 8 adjuvant Xeloda  12/24/2018   5.  Anorexia/weight loss- resolved   6.  Undescended testicles   7.  Right renal mass.  CT 08/29/2017-right renal mass involving the lower pole was partially calcified and does demonstrate enhancement following contrast most consistent with renal cell carcinoma.  Lesion not significantly enlarged compared with the prior CT. Followed  by  urology. 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 to the IVC/RA junction, upper normal size retrocaval node 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, nuclear grade 3 Axitinib /pembrolizumab  10/22/2021 Axitinib  placed on hold 11/05/2021 due to hand-foot syndrome Axitinib  resumed at a dose of 5 mg daily 11/12/2021, pembrolizumab  11/12/2021 Axitinib  discontinued 12/03/2021 secondary to progressive hand-foot syndrome, pembrolizumab  12/03/2021 Axitinib  resumed at a reduced dose of 2 mg twice daily after office visit 12/14/2021 Pembrolizumab  12/24/2021 Axitinib  stopped 01/03/2022 secondary to persistent hand/foot symptoms CT chest at [facility] 01/13/2022-no evidence of thoracic metastatic disease, similar diffuse sclerotic change in the skeleton MRI abdomen at [facility] 01/13/2022-similar size of right lower pole renal mass with renal vein thrombosis, decreased overall extent of thrombus now extending to the intrahepatic IVC, enhancement of right renal mass and thrombus is decreased, new left hydroureter ureter nephrosis with asymmetric perinephric stranding Pembrolizumab  01/27/2022 Axitinib  resumed 2 mg daily 01/28/2022 CT abdomen/pelvis at [facility] 02/10/2022-overall reduced size of right lower pole renal mass which demonstrates persistent region of enhancement along the posterior and lateral margin with extension into the renal sinus.  Renal vein thrombus extends into the IVC and is also reduced in  size.  Query additional IVC thrombus at the intrahepatic IVC and extending into the right atrium. Axitinib  continued 2 mg daily, Pembrolizumab  every 3 weeks CT 05/05/2022 at [facility]-partially visualized pulmonary embolism involving segmental arteries of the right lower lobe.  Interval slight decrease in size of the mass arising from the lower pole the right kidney.  Mass demonstrates persistent moderate necrosis centrally.  Increased enhancing soft tissue component measuring up to 2 cm which is mildly increased in size compared to prior from 02/10/2022.  Bland tumor thrombus identified in the IVC at the level of the right renal vein ostium extending into the suprarenal IVC superiorly.  No obvious thrombus identified in the infrarenal, intrahepatic and suprahepatic IVC.  Likely trace bland thrombus in the anterior right renal vein.  Stable several subcentimeter retroperitoneal lymph nodes.  Diffuse osseous sclerotic and lytic metastatic disease stable compared to prior. Axitinib  placed on hold 05/04/2022 due to pain bilateral feet; 05/13/2022 he will continue to hold axitinib  Pembrolizumab  05/13/2022 Pembrolizumab  06/03/2022 Robotic right radical nephrectomy and IVC thrombectomy 06/27/2022-right kidney with tumor necrosis/hemorrhage,ypT0, adherent thrombus with cellular necrosis and no viable carcinoma present at the renal vein margin, other margins negative for carcinoma, 0/3 lymph nodes.  6.5 cm tumor including intraparenchymal lesion and thrombus in the renal vein 8.  Diabetes   9.  Anemia-stool Hemoccults + March 2021, referred to Dr. Rollin for endoscopic evaluation, anemia also secondary to metastatic prostate cancer, hypogonadism Small bowel enteroscopy 02/11/2020-esophagus normal.  Hematin found in the gastric body.  Examined duodenum normal.  No evidence of significant pathology in the entire examined portion of the jejunum.  There was no overt source of bleeding or inflammation.  The bleeding may be from small  AVMs.  Surgical anastomosis was not visible with repeated inspection of the duodenum. Colonoscopy 02/11/2020-multiple sessile and semipedunculated polyps were found in the rectum, descending colon and ascending colon,  3 to 18 mm in size.  Polyps removed (polypectomy descending colon-fragments of tubular adenoma, inflammatory type polyp; polypectomy ascending colon-tubular adenoma, no high-grade dysplasia or malignancy; polypectomy descending colon-fragments of tubulovillous adenoma, no high-grade dysplasia or malignancy; polypectomy colon, rectum-tubular adenoma, no high-grade dysplasia or malignancy). Colonoscopy 02/02/2021-seven 2 to 10 mm polyps in the sigmoid colon (tubular adenoma), descending colon (fragments of tubulovillous adenoma, fragments of tubular adenoma ) and transverse colon (fragments of tubular adenoma). Progressive anemia 07/05/2021; stool cards positive Upper endoscopy/colonoscopy 09/23/2021-multiple polyps removed from the colon-tubular adenomas, diffuse angioectasias in the stomach with bleeding-not amenable to endoscopic treatment 10.  Renal failure-progressive 01/17/2022, progressive following right nephrectomy October 2023 11.  CT chest at [facility] 01/13/2022-no evidence of thoracic metastatic disease, similar diffuse sclerotic changes in the skeleton 12.  CT renal stone study 01/14/2022-mild left side hydroureteronephrosis secondary to a left UVJ stone, left nephrolithiasis, new right middle lobe collapse/consolidation, edema/fluid at the left iliopsoas muscle 13.  PE noted on CT 05/05/2022-on apixaban .  He saw Dr. Darcy at [facility] 05/16/2022; instructions are to hold apixaban  72 hours prior to scheduled surgery and start Lovenox at prophylactic dosing. 14.  Metastatic small bowel carcinoma  CTs at [facility] 02/13/2024-new periaortic lymphadenopathy and peritoneal and omental carcinomatosis, small amount of new ascites, similar-appearing diffuse bony metastatic disease, focal thickening of the gastric  body Omental biopsy 03/07/2024-metastatic moderately differentiated adenocarcinoma compatible with GI/intestinal primary; foundation 1 microsatellite stable, tumor mutation burden 1, ERBB2 amplification equivocal, NRAS Q61H; HER2 positive FISH CT 03/21/2024-similar abdominal retroperitoneal nodal metastasis.  1 node measuring minimally larger and the other minimally smaller.  Similar omental/peritoneal metastasis with mild increase in small volume abdominopelvic ascites.  Similar diffuse osseous metastasis.  New tiny left pleural effusion. Cycle 1 FOLFOX 04/09/2024    Disposition: Mr. Armitage appears unchanged.  He is scheduled to begin treatment today with FOLFOX.  We again reviewed potential toxicities.  He agrees to proceed.   We reviewed the positive HER2 FISH result.  We discussed possibly adding HER2 directed therapy to the treatment regimen beginning with cycle 2.  We reviewed potential side effects including cardiotoxicity, allergic reaction, diarrhea.  He had a 2D echo at [facility] 02/14/2024, LVEF estimated at greater than 55%.  CBC and chemistry panel reviewed.  Labs are adequate to proceed with treatment.  He has progressive anemia and appears symptomatic.  We are making arrangements for a blood transfusion later this week.  Creatinine is elevated, at baseline today.  He will return for follow-up and treatment in 2 weeks.  We are available to see him sooner if needed.  Patient seen with Dr. Cloretta.    Olam Ned ANP/GNP-BC   04/09/2024  11:39 AM  Addendum 3:36 PM-approximately 12 minutes into the Oxaliplatin  infusion Mr. Covault reported a tingly/tight sensation at the left chest.  The sensation resolved within a few seconds.  No shortness of breath, chest pain, rash, pruritus.  I evaluated him in the infusion area.  No distress.  Vital signs stable.  Lungs clear bilaterally.  Regular rate and rhythm.  No rash.  He was given Pepcid  20 mg IV.  Oxaliplatin  resumed.  I checked on him about 30  minutes later.  No further issues.  LT This was a shared visit with Olam Ned.  Mr. Gienger will begin systemic therapy today.  The HER2 testing on the omentum biopsy returned positive by FISH.  We will check into insurance approval and consider adding HER2 directed therapy with cycle 2.  Reviewed potential toxicities  associated with trastuzumab.  He has severe anemia secondary to chronic disease, renal failure, prostate cancer, and potentially bleeding.  He will be transfused with packed red blood cells this week. Arvella Hof, MD

## 2024-04-10 ENCOUNTER — Telehealth: Payer: Self-pay

## 2024-04-10 NOTE — Telephone Encounter (Signed)
 24 Hour Call Back Reached out to patient regarding his first treatment. Patient reported having no side effects and to be feeling fine. Patient educated to please call us  with any questions or concerns.

## 2024-04-11 ENCOUNTER — Other Ambulatory Visit: Payer: Self-pay

## 2024-04-11 ENCOUNTER — Inpatient Hospital Stay

## 2024-04-11 VITALS — BP 131/70 | HR 83 | Temp 98.2°F | Resp 16

## 2024-04-11 DIAGNOSIS — Z5111 Encounter for antineoplastic chemotherapy: Secondary | ICD-10-CM | POA: Diagnosis not present

## 2024-04-11 DIAGNOSIS — D649 Anemia, unspecified: Secondary | ICD-10-CM

## 2024-04-11 DIAGNOSIS — C179 Malignant neoplasm of small intestine, unspecified: Secondary | ICD-10-CM

## 2024-04-11 LAB — PREPARE RBC (CROSSMATCH)

## 2024-04-11 MED ORDER — SODIUM CHLORIDE 0.9% FLUSH: 10.0000 mL | INTRAVENOUS | Status: DC | PRN

## 2024-04-11 MED ORDER — HEPARIN SOD (PORK) LOCK FLUSH 100 UNIT/ML IV SOLN: 500.0000 [IU] | Freq: Once | INTRAVENOUS | Status: DC | PRN

## 2024-04-12 ENCOUNTER — Inpatient Hospital Stay

## 2024-04-12 DIAGNOSIS — Z5111 Encounter for antineoplastic chemotherapy: Secondary | ICD-10-CM | POA: Diagnosis not present

## 2024-04-12 DIAGNOSIS — D649 Anemia, unspecified: Secondary | ICD-10-CM

## 2024-04-12 MED ORDER — HEPARIN SOD (PORK) LOCK FLUSH 100 UNIT/ML IV SOLN
500.0000 [IU] | Freq: Every day | INTRAVENOUS | Status: AC | PRN
  Administered 2024-04-12: 500 [IU]

## 2024-04-12 MED ORDER — SODIUM CHLORIDE 0.9% FLUSH
10.0000 mL | INTRAVENOUS | Status: AC | PRN
  Administered 2024-04-12: 10 mL

## 2024-04-12 MED ORDER — SODIUM CHLORIDE 0.9% IV SOLUTION
250.0000 mL | INTRAVENOUS | Status: DC
  Administered 2024-04-12: 250 mL via INTRAVENOUS

## 2024-04-12 NOTE — Patient Instructions (Signed)

## 2024-04-13 ENCOUNTER — Other Ambulatory Visit: Payer: Self-pay

## 2024-04-15 LAB — TYPE AND SCREEN
ABO/RH(D): O POS
Antibody Screen: NEGATIVE
Unit division: 0
Unit division: 0

## 2024-04-15 LAB — BPAM RBC
Blood Product Expiration Date: 202508162359
Blood Product Expiration Date: 202508162359
ISSUE DATE / TIME: 202507180722
ISSUE DATE / TIME: 202507180722
Unit Type and Rh: 5100
Unit Type and Rh: 5100

## 2024-04-18 ENCOUNTER — Telehealth: Payer: Self-pay | Admitting: *Deleted

## 2024-04-18 DIAGNOSIS — D649 Anemia, unspecified: Secondary | ICD-10-CM

## 2024-04-18 NOTE — Telephone Encounter (Signed)
 Informed Mr. Robart to go to ER if this occurs again. Need to check his CBC/blood bank. He said he is not able to come today for lab. Scheduled for 0900 and he will wait in lobby for results and accepts peripheral stick to expedite process. Message to charge nurse to see if he could have transfusion tomorrow at least 1 unit.

## 2024-04-18 NOTE — Telephone Encounter (Signed)
 Daughter had called and left VM that patient was vomiting blood today. Called Brandon Peters and he reports he vomited about 1 cup of vomit that was dark, coffee ground color (not bright red). No blood in urine or stool. Occurred at 0500 today and none since. Denies any dizziness, shortness of breath. Just feels tired.  Last chemo 7/15 and had 2 units transfused on 7/18.

## 2024-04-19 ENCOUNTER — Other Ambulatory Visit: Payer: Self-pay

## 2024-04-19 ENCOUNTER — Inpatient Hospital Stay

## 2024-04-19 ENCOUNTER — Ambulatory Visit

## 2024-04-19 VITALS — BP 119/83 | HR 87 | Temp 97.3°F | Resp 18

## 2024-04-19 DIAGNOSIS — E860 Dehydration: Secondary | ICD-10-CM

## 2024-04-19 DIAGNOSIS — C179 Malignant neoplasm of small intestine, unspecified: Secondary | ICD-10-CM

## 2024-04-19 DIAGNOSIS — D649 Anemia, unspecified: Secondary | ICD-10-CM

## 2024-04-19 DIAGNOSIS — Z95828 Presence of other vascular implants and grafts: Secondary | ICD-10-CM

## 2024-04-19 LAB — CBC WITH DIFFERENTIAL (CANCER CENTER ONLY)
Abs Immature Granulocytes: 0.03 K/uL (ref 0.00–0.07)
Basophils Absolute: 0 K/uL (ref 0.0–0.1)
Basophils Relative: 0 %
Eosinophils Absolute: 0 K/uL (ref 0.0–0.5)
Eosinophils Relative: 1 %
HCT: 32.8 % — ABNORMAL LOW (ref 39.0–52.0)
Hemoglobin: 10.2 g/dL — ABNORMAL LOW (ref 13.0–17.0)
Immature Granulocytes: 1 %
Lymphocytes Relative: 13 %
Lymphs Abs: 0.5 K/uL — ABNORMAL LOW (ref 0.7–4.0)
MCH: 29.6 pg (ref 26.0–34.0)
MCHC: 31.1 g/dL (ref 30.0–36.0)
MCV: 95.1 fL (ref 80.0–100.0)
Monocytes Absolute: 0.2 K/uL (ref 0.1–1.0)
Monocytes Relative: 5 %
Neutro Abs: 3.3 K/uL (ref 1.7–7.7)
Neutrophils Relative %: 80 %
Platelet Count: 90 K/uL — ABNORMAL LOW (ref 150–400)
RBC: 3.45 MIL/uL — ABNORMAL LOW (ref 4.22–5.81)
RDW: 15.1 % (ref 11.5–15.5)
WBC Count: 4.1 K/uL (ref 4.0–10.5)
nRBC: 0 % (ref 0.0–0.2)

## 2024-04-19 LAB — SAMPLE TO BLOOD BANK

## 2024-04-19 MED ORDER — SODIUM CHLORIDE 0.9 % IV SOLN: INTRAVENOUS | Status: AC

## 2024-04-19 MED ORDER — SODIUM CHLORIDE 0.9% FLUSH
10.0000 mL | INTRAVENOUS | Status: DC | PRN
  Administered 2024-04-19: 10 mL via INTRAVENOUS

## 2024-04-19 MED ORDER — HEPARIN SOD (PORK) LOCK FLUSH 100 UNIT/ML IV SOLN
500.0000 [IU] | Freq: Once | INTRAVENOUS | Status: AC
  Administered 2024-04-19: 500 [IU] via INTRAVENOUS

## 2024-04-19 NOTE — Progress Notes (Signed)
 The patient received 1000 cc of IV fluids and reports feeling improved. Vitals are stable. The patient inquired about consulting with a nutritionist regarding decreased appetite. The patient was stable upon discharge from the cancer center.

## 2024-04-19 NOTE — Patient Instructions (Signed)
 Fluids Given Through an IV (IV Infusion Therapy): What to Expect IV infusion therapy is a treatment to deliver a fluid, called an infusion, into a vein. You may have IV infusion to get: Fluids. Medicines. Nutrition. Chemotherapy. This is medicines to stop or slow down cancer cells. Blood or blood products. Dye that is given before an MRI or a CT scan. This is called contrast dye. Tell a health care provider about: Any allergies you have. This includes allergies to anesthesia or dyes. All medicines you take. These include vitamins, herbs, eye drops, and creams. Any bleeding problems you have. Any surgeries you've had, including if you've had lymph nodes taken out of your armpit or if you have a arteriovenous fistula for dialysis. Any medical problems you have. Whether you're pregnant or may be pregnant. Whether you've used IV drugs. What are the risks? Your health care provider will talk with you about risks. These may include: Pain, bruising, or bleeding. Infection. The IV leaking or moving out of place. Damage to blood vessels or nerves. Allergic reactions to medicines or dyes. A blood clot. An air bubble in the vein, also called an air embolism. What happens before the procedure? Eat and drink only as you've been told. Ask about changing or stopping: Any medicines you take. Any vitamins, herbs, or supplements you take. What happens during the procedure?     Placing the catheter Your skin at the IV site will be washed with fluid that kills germs. This will help prevent infection. IV infusion therapy starts with a procedure to place a soft tube called a catheter into a vein. An IV tube will be attached to the catheter to let the infusion flow into your blood. Your catheter may be placed: Into a vein that is usually in the bend of the elbow, forearm, or back of the hand. This is called a peripheral IV catheter. This may need to be put into a vein each time you get an  infusion. Into a vein near your elbow. This is called a midline catheter or a peripherally inserted central catheter (PICC). These types of catheters may stay in place for weeks or months at a time so you can receive repeated infusions through it. Into a vein near your neck that leads to your heart. This is called a non-tunneled catheter. This is only used for short amounts of time because it can cause infection. Through the skin of your chest and into a large vein that leads to your heart. This is called a tunneled catheter. This may stay in your body for months or years. Into an implanted port. An implanted port is a device that is surgically inserted under the skin of the chest to provide long-term IV access. The catheter will connect the port to a large vein in the chest or upper arm. A port may be kept in place for many months or years. Each time you have an infusion, a needle will be inserted through your skin to connect the catheter to the port. Doing the infusion To start the infusion, your provider will: Attach the IV tubing to your catheter. Use a tape or a bandage to hold the IV in place against your skin. An IV pump may be used to control the flow of the IV infusion. During the infusion, your provider will check the area to make sure: There is no bleeding, swelling, or pain. Your IV infusion is flowing correctly. After the infusion, your provider will: Take off the bandage  or tape. Disconnect the tubing from the catheter. Remove the catheter, if you have a peripheral IV. Apply pressure over the IV insertion site to stop bleeding, then cover the area with a bandage. If you have an implanted port, PICC, non-tunneled, or tunneled catheter, your catheter may remain in place. This depends on how many times you will need treatment, your medical condition, and what type of catheter you have. These steps may vary. Ask what you can expect. What can I expect after the procedure? You may be  watched closely until you leave. This includes checking your pain level, blood pressure, heart rate, and breathing rate. Your provider will check to make sure there are no signs of infection. Follow these instructions at home: Take your medicines only as told. Change or take off your bandage as told by your provider. Ask what things are safe for you to do at home. Ask when you can go back to work or school. Do not take baths, swim, or use a hot tub until you're told it's OK. Ask if you can shower. Check your IV insertion site every day for signs of infection. Check for: Redness, swelling, or pain. Fluid or blood. If fluid or blood drains from your IV site, use your hands to press down firmly on the area for a minute or two. Doing this should stop the bleeding. Warmth. Pus or a bad smell. Contact a health care provider if: You have signs of infection around your IV site. You have fluid or blood coming from your IV site that does not stop after you put pressure to the site. You have a rash or blisters. You have itchy, red, swollen areas of skin called hives. Get help right away if: You have a fever or chills. You have chest pain. You have trouble breathing. This information is not intended to replace advice given to you by your health care provider. Make sure you discuss any questions you have with your health care provider. Document Revised: 03/07/2023 Document Reviewed: 03/07/2023 Elsevier Patient Education  2024 [employer].

## 2024-04-20 ENCOUNTER — Other Ambulatory Visit: Payer: Self-pay | Admitting: Oncology

## 2024-04-22 ENCOUNTER — Other Ambulatory Visit: Payer: Self-pay | Admitting: *Deleted

## 2024-04-22 ENCOUNTER — Inpatient Hospital Stay (HOSPITAL_COMMUNITY)
Admission: AD | Admit: 2024-04-22 | Discharge: 2024-04-27 | DRG: 682 | Disposition: A | Discharge status: Hospice | Source: Ambulatory Visit | Attending: Internal Medicine | Admitting: Internal Medicine

## 2024-04-22 ENCOUNTER — Other Ambulatory Visit (HOSPITAL_COMMUNITY): Payer: Self-pay

## 2024-04-22 ENCOUNTER — Inpatient Hospital Stay (HOSPITAL_COMMUNITY)

## 2024-04-22 ENCOUNTER — Inpatient Hospital Stay (HOSPITAL_BASED_OUTPATIENT_CLINIC_OR_DEPARTMENT_OTHER): Admitting: Nurse Practitioner

## 2024-04-22 ENCOUNTER — Encounter: Payer: Self-pay | Admitting: *Deleted

## 2024-04-22 ENCOUNTER — Inpatient Hospital Stay

## 2024-04-22 ENCOUNTER — Encounter (HOSPITAL_COMMUNITY): Payer: Self-pay

## 2024-04-22 ENCOUNTER — Telehealth: Payer: Self-pay | Admitting: *Deleted

## 2024-04-22 DIAGNOSIS — Z934 Other artificial openings of gastrointestinal tract status: Secondary | ICD-10-CM

## 2024-04-22 DIAGNOSIS — C179 Malignant neoplasm of small intestine, unspecified: Secondary | ICD-10-CM | POA: Diagnosis present

## 2024-04-22 DIAGNOSIS — E860 Dehydration: Secondary | ICD-10-CM

## 2024-04-22 DIAGNOSIS — Z860100 Personal history of colon polyps, unspecified: Secondary | ICD-10-CM

## 2024-04-22 DIAGNOSIS — N139 Obstructive and reflux uropathy, unspecified: Secondary | ICD-10-CM | POA: Diagnosis present

## 2024-04-22 DIAGNOSIS — E871 Hypo-osmolality and hyponatremia: Secondary | ICD-10-CM | POA: Diagnosis present

## 2024-04-22 DIAGNOSIS — E785 Hyperlipidemia, unspecified: Secondary | ICD-10-CM | POA: Diagnosis present

## 2024-04-22 DIAGNOSIS — N179 Acute kidney failure, unspecified: Secondary | ICD-10-CM | POA: Diagnosis not present

## 2024-04-22 DIAGNOSIS — Z880 Allergy status to penicillin: Secondary | ICD-10-CM

## 2024-04-22 DIAGNOSIS — K5900 Constipation, unspecified: Secondary | ICD-10-CM | POA: Diagnosis not present

## 2024-04-22 DIAGNOSIS — N184 Chronic kidney disease, stage 4 (severe): Secondary | ICD-10-CM | POA: Diagnosis present

## 2024-04-22 DIAGNOSIS — Q5320 Undescended testicle, unspecified, bilateral: Secondary | ICD-10-CM

## 2024-04-22 DIAGNOSIS — I129 Hypertensive chronic kidney disease with stage 1 through stage 4 chronic kidney disease, or unspecified chronic kidney disease: Secondary | ICD-10-CM | POA: Diagnosis present

## 2024-04-22 DIAGNOSIS — I10 Essential (primary) hypertension: Secondary | ICD-10-CM | POA: Diagnosis present

## 2024-04-22 DIAGNOSIS — Z7984 Long term (current) use of oral hypoglycemic drugs: Secondary | ICD-10-CM

## 2024-04-22 DIAGNOSIS — E870 Hyperosmolality and hypernatremia: Secondary | ICD-10-CM | POA: Diagnosis present

## 2024-04-22 DIAGNOSIS — Z1731 Human epidermal growth factor receptor 2 positive status: Secondary | ICD-10-CM

## 2024-04-22 DIAGNOSIS — D701 Agranulocytosis secondary to cancer chemotherapy: Secondary | ICD-10-CM | POA: Diagnosis present

## 2024-04-22 DIAGNOSIS — Z7189 Other specified counseling: Secondary | ICD-10-CM | POA: Diagnosis not present

## 2024-04-22 DIAGNOSIS — D61810 Antineoplastic chemotherapy induced pancytopenia: Secondary | ICD-10-CM | POA: Diagnosis present

## 2024-04-22 DIAGNOSIS — C7951 Secondary malignant neoplasm of bone: Secondary | ICD-10-CM | POA: Diagnosis present

## 2024-04-22 DIAGNOSIS — E8721 Acute metabolic acidosis: Secondary | ICD-10-CM | POA: Diagnosis present

## 2024-04-22 DIAGNOSIS — E119 Type 2 diabetes mellitus without complications: Secondary | ICD-10-CM | POA: Diagnosis not present

## 2024-04-22 DIAGNOSIS — Z833 Family history of diabetes mellitus: Secondary | ICD-10-CM

## 2024-04-22 DIAGNOSIS — T451X5A Adverse effect of antineoplastic and immunosuppressive drugs, initial encounter: Secondary | ICD-10-CM | POA: Diagnosis present

## 2024-04-22 DIAGNOSIS — Z515 Encounter for palliative care: Secondary | ICD-10-CM | POA: Diagnosis not present

## 2024-04-22 DIAGNOSIS — E872 Acidosis, unspecified: Secondary | ICD-10-CM | POA: Diagnosis present

## 2024-04-22 DIAGNOSIS — Z801 Family history of malignant neoplasm of trachea, bronchus and lung: Secondary | ICD-10-CM

## 2024-04-22 DIAGNOSIS — N170 Acute kidney failure with tubular necrosis: Secondary | ICD-10-CM | POA: Diagnosis present

## 2024-04-22 DIAGNOSIS — Z789 Other specified health status: Secondary | ICD-10-CM | POA: Diagnosis not present

## 2024-04-22 DIAGNOSIS — D631 Anemia in chronic kidney disease: Secondary | ICD-10-CM | POA: Diagnosis present

## 2024-04-22 DIAGNOSIS — E1122 Type 2 diabetes mellitus with diabetic chronic kidney disease: Secondary | ICD-10-CM | POA: Diagnosis present

## 2024-04-22 DIAGNOSIS — C61 Malignant neoplasm of prostate: Secondary | ICD-10-CM | POA: Diagnosis present

## 2024-04-22 DIAGNOSIS — Z66 Do not resuscitate: Secondary | ICD-10-CM | POA: Diagnosis present

## 2024-04-22 DIAGNOSIS — Z905 Acquired absence of kidney: Secondary | ICD-10-CM

## 2024-04-22 DIAGNOSIS — N1831 Chronic kidney disease, stage 3a: Secondary | ICD-10-CM | POA: Diagnosis not present

## 2024-04-22 DIAGNOSIS — C786 Secondary malignant neoplasm of retroperitoneum and peritoneum: Secondary | ICD-10-CM | POA: Diagnosis present

## 2024-04-22 DIAGNOSIS — Z7952 Long term (current) use of systemic steroids: Secondary | ICD-10-CM

## 2024-04-22 DIAGNOSIS — Z8249 Family history of ischemic heart disease and other diseases of the circulatory system: Secondary | ICD-10-CM

## 2024-04-22 DIAGNOSIS — N390 Urinary tract infection, site not specified: Secondary | ICD-10-CM | POA: Diagnosis present

## 2024-04-22 DIAGNOSIS — Z8546 Personal history of malignant neoplasm of prostate: Secondary | ICD-10-CM

## 2024-04-22 DIAGNOSIS — I2782 Chronic pulmonary embolism: Secondary | ICD-10-CM | POA: Diagnosis present

## 2024-04-22 DIAGNOSIS — Z85068 Personal history of other malignant neoplasm of small intestine: Secondary | ICD-10-CM

## 2024-04-22 DIAGNOSIS — D61818 Other pancytopenia: Secondary | ICD-10-CM | POA: Diagnosis not present

## 2024-04-22 DIAGNOSIS — Z87442 Personal history of urinary calculi: Secondary | ICD-10-CM

## 2024-04-22 DIAGNOSIS — E876 Hypokalemia: Secondary | ICD-10-CM | POA: Diagnosis not present

## 2024-04-22 DIAGNOSIS — Z7901 Long term (current) use of anticoagulants: Secondary | ICD-10-CM

## 2024-04-22 DIAGNOSIS — E1165 Type 2 diabetes mellitus with hyperglycemia: Secondary | ICD-10-CM | POA: Diagnosis present

## 2024-04-22 DIAGNOSIS — R180 Malignant ascites: Secondary | ICD-10-CM | POA: Diagnosis present

## 2024-04-22 DIAGNOSIS — Z79899 Other long term (current) drug therapy: Secondary | ICD-10-CM

## 2024-04-22 DIAGNOSIS — N19 Unspecified kidney failure: Secondary | ICD-10-CM | POA: Diagnosis present

## 2024-04-22 DIAGNOSIS — Z85528 Personal history of other malignant neoplasm of kidney: Secondary | ICD-10-CM

## 2024-04-22 LAB — SAMPLE TO BLOOD BANK

## 2024-04-22 LAB — CBC WITH DIFFERENTIAL (CANCER CENTER ONLY)
Abs Immature Granulocytes: 0.01 K/uL (ref 0.00–0.07)
Basophils Absolute: 0 K/uL (ref 0.0–0.1)
Basophils Relative: 1 %
Eosinophils Absolute: 0 K/uL (ref 0.0–0.5)
Eosinophils Relative: 2 %
HCT: 26.1 % — ABNORMAL LOW (ref 39.0–52.0)
Hemoglobin: 8 g/dL — ABNORMAL LOW (ref 13.0–17.0)
Immature Granulocytes: 1 %
Lymphocytes Relative: 29 %
Lymphs Abs: 0.6 K/uL — ABNORMAL LOW (ref 0.7–4.0)
MCH: 29.2 pg (ref 26.0–34.0)
MCHC: 30.7 g/dL (ref 30.0–36.0)
MCV: 95.3 fL (ref 80.0–100.0)
Monocytes Absolute: 0.2 K/uL (ref 0.1–1.0)
Monocytes Relative: 11 %
Neutro Abs: 1.1 K/uL — ABNORMAL LOW (ref 1.7–7.7)
Neutrophils Relative %: 56 %
Platelet Count: 82 K/uL — ABNORMAL LOW (ref 150–400)
RBC: 2.74 MIL/uL — ABNORMAL LOW (ref 4.22–5.81)
RDW: 15.3 % (ref 11.5–15.5)
WBC Count: 1.9 K/uL — ABNORMAL LOW (ref 4.0–10.5)
nRBC: 0 % (ref 0.0–0.2)

## 2024-04-22 LAB — CMP (CANCER CENTER ONLY)
ALT: 27 U/L (ref 0–44)
AST: 29 U/L (ref 15–41)
Albumin: 3.4 g/dL — ABNORMAL LOW (ref 3.5–5.0)
Alkaline Phosphatase: 108 U/L (ref 38–126)
Anion gap: 19 — ABNORMAL HIGH (ref 5–15)
BUN: 108 mg/dL — ABNORMAL HIGH (ref 8–23)
CO2: 14 mmol/L — ABNORMAL LOW (ref 22–32)
Calcium: 10.2 mg/dL (ref 8.9–10.3)
Chloride: 120 mmol/L — ABNORMAL HIGH (ref 98–111)
Creatinine: 6.27 mg/dL — ABNORMAL HIGH (ref 0.61–1.24)
GFR, Estimated: 9 mL/min — ABNORMAL LOW (ref >60)
Glucose, Bld: 233 mg/dL — ABNORMAL HIGH (ref 70–99)
Potassium: 3.8 mmol/L (ref 3.5–5.1)
Sodium: 154 mmol/L — ABNORMAL HIGH (ref 135–145)
Total Bilirubin: 0.4 mg/dL (ref 0.0–1.2)
Total Protein: 6.6 g/dL (ref 6.5–8.1)

## 2024-04-22 LAB — GLUCOSE, CAPILLARY: Glucose-Capillary: 202 mg/dL — ABNORMAL HIGH (ref 70–99)

## 2024-04-22 MED ORDER — ONDANSETRON HCL 4 MG/2ML IJ SOLN
8.0000 mg | Freq: Once | INTRAMUSCULAR | Status: AC
  Administered 2024-04-22: 8 mg via INTRAVENOUS
  Filled 2024-04-22: qty 4

## 2024-04-22 MED ORDER — SODIUM BICARBONATE 8.4 % IV SOLN
INTRAVENOUS | Status: DC
  Filled 2024-04-22: qty 1000
  Filled 2024-04-22: qty 150
  Filled 2024-04-22: qty 1000

## 2024-04-22 MED ORDER — DEXTROSE 5 % IV SOLN: INTRAVENOUS | Status: DC

## 2024-04-22 MED ORDER — ACETAMINOPHEN 650 MG RE SUPP: 650.0000 mg | Freq: Four times a day (QID) | RECTAL | Status: DC | PRN

## 2024-04-22 MED ORDER — ONDANSETRON HCL 4 MG/2ML IJ SOLN: 4.0000 mg | Freq: Four times a day (QID) | INTRAMUSCULAR | Status: DC | PRN

## 2024-04-22 MED ORDER — PANTOPRAZOLE SODIUM 40 MG IV SOLR
40.0000 mg | Freq: Two times a day (BID) | INTRAVENOUS | Status: DC
  Administered 2024-04-22 – 2024-04-26 (×9): 40 mg via INTRAVENOUS
  Filled 2024-04-22 (×9): qty 10

## 2024-04-22 MED ORDER — SODIUM CHLORIDE 0.9 % IV SOLN: INTRAVENOUS | Status: AC

## 2024-04-22 MED ORDER — ACETAMINOPHEN 325 MG PO TABS: 650.0000 mg | ORAL_TABLET | Freq: Four times a day (QID) | ORAL | Status: DC | PRN

## 2024-04-22 MED ORDER — INSULIN ASPART 100 UNIT/ML IJ SOLN
0.0000 [IU] | Freq: Every day | INTRAMUSCULAR | Status: DC
  Administered 2024-04-22: 2 [IU] via SUBCUTANEOUS

## 2024-04-22 MED ORDER — METOCLOPRAMIDE HCL 5 MG/ML IJ SOLN: 10.0000 mg | Freq: Four times a day (QID) | INTRAMUSCULAR | Status: DC | PRN

## 2024-04-22 MED ORDER — ONDANSETRON HCL 4 MG PO TABS: 4.0000 mg | ORAL_TABLET | Freq: Four times a day (QID) | ORAL | Status: DC | PRN

## 2024-04-22 MED ORDER — HEPARIN SODIUM (PORCINE) 5000 UNIT/ML IJ SOLN
5000.0000 [IU] | Freq: Three times a day (TID) | INTRAMUSCULAR | Status: DC
  Administered 2024-04-22 – 2024-04-26 (×11): 5000 [IU] via SUBCUTANEOUS
  Filled 2024-04-22 (×10): qty 1

## 2024-04-22 MED ORDER — FENTANYL CITRATE PF 50 MCG/ML IJ SOSY: 12.5000 ug | PREFILLED_SYRINGE | INTRAMUSCULAR | Status: DC | PRN

## 2024-04-22 MED ORDER — INSULIN ASPART 100 UNIT/ML IJ SOLN
0.0000 [IU] | Freq: Three times a day (TID) | INTRAMUSCULAR | Status: DC
  Administered 2024-04-23 (×2): 2 [IU] via SUBCUTANEOUS
  Administered 2024-04-23: 3 [IU] via SUBCUTANEOUS
  Administered 2024-04-24 (×2): 2 [IU] via SUBCUTANEOUS
  Administered 2024-04-24: 1 [IU] via SUBCUTANEOUS
  Administered 2024-04-25: 5 [IU] via SUBCUTANEOUS

## 2024-04-22 MED ORDER — OXYCODONE HCL 5 MG PO TABS: 5.0000 mg | ORAL_TABLET | ORAL | Status: DC | PRN

## 2024-04-22 NOTE — Progress Notes (Unsigned)
 {  Select_TRH_Note:[id number]}

## 2024-04-22 NOTE — Telephone Encounter (Signed)
 Brandon Peters has been brought into office for IVF/Zofran  and labs. Saw NP. Changed IVF to D5W due to high sodium. Will be admitted for renal failure.

## 2024-04-22 NOTE — H&P (Signed)
 History and Physical    Rober Skeels FMW:[id number] DOB: Mar 24, 1956 DOA: 04/22/2024  PCP: Bari Morgans, NP   Patient coming from: Home  I have personally briefly reviewed patient's old medical records in [facility]  Chief Complaint: Sent from cancer center for direct admission for nausea and vomiting  HPI: Brandon Peters is a 68 y.o. male with medical history significant of CKD stage IV (baseline of around 2.5), metastatic prostate cancer, renal cell carcinoma status post right nephrectomy in October 2023, metastatic small bowel carcinoma currently on chemotherapy with cycle 1 of FOLFOX on 04/09/2024 anemia of chronic disease, PE on Eliquis , hypertension, hyperlipidemia, diabetes mellitus type 2, admission at [facility] in 01/2024 for AKI on CKD stage IV treated with IV fluids and holding diuretics was sent from cancer center today for direct admission with nausea and vomiting.  Patient has had worsening nausea, vomiting and is unable to tolerate solids, liquids, medications for over 1 week.  Fairly soon after he swallows, he denies tears.  Denies any mouth sores, sore throat, diarrhea, dysuria, worsening abdominal pain, cough, chest pain, shortness of breath, fever, loss of consciousness or seizures.  At cancer Center, sodium was 154, bicarb of 14, creatinine of 6.27 creatinine on 04/09/2024 was 2.53) WBC of 1.9, hemoglobin of 8, platelets of 82.  He was transferred to [facility] because of severe renal failure and need for possible nephrology evaluation.   Review of Systems: As per HPI otherwise all other systems were reviewed and are negative.   Past Medical History:  Diagnosis Date   ARF (acute renal failure) (HCC)    d/t lisinopril and/or diazide- was on both in a 2 week period- cr up to 4.99   Diabetes mellitus    Family history of lung cancer    Hyperlipidemia    Hypertension    Personal history of other malignant neoplasm of small intestine    RENAL CALCULUS, HX OF  06/26/2007    Past Surgical History:  Procedure Laterality Date   COLONOSCOPY N/A 03/04/2016   Procedure: COLONOSCOPY;  Surgeon: Belvie Just, MD;  Location: [facility] ENDOSCOPY;  Service: Endoscopy;  Laterality: N/A;   ENTEROSCOPY N/A 03/04/2016   Procedure: ENTEROSCOPY;  Surgeon: Belvie Just, MD;  Location: [facility] ENDOSCOPY;  Service: Endoscopy;  Laterality: N/A;   IR IMAGING GUIDED PORT INSERTION  04/08/2024     reports that he has never smoked. He has never used smokeless tobacco. He reports that he does not drink alcohol and does not use drugs.  Allergies  Allergen Reactions   Penicillins Other (See Comments)    Pt states that he cannot really remember reaction. Tentatively reports swelling/hives    Family History  Problem Relation Age of Onset   Cancer Mother    Diabetes Mother    Hypertension Mother    Coronary artery disease Mother    Hyperlipidemia Sister    Hypertension Sister     Prior to Admission medications   Medication Sig Start Date End Date Taking? Authorizing Provider  abiraterone  acetate (ZYTIGA ) 250 MG tablet TAKE 4 TABLETS BY MOUTH ONCE DAILY AS DIRECTED.  TAKE 1 HOUR BEFORE OR 2 HOURS AFTER A MEAL 11/23/23   Cloretta Arley NOVAK, MD  amLODipine  (NORVASC ) 10 MG tablet Take 10 mg by mouth daily. 04/21/16   [provider]  apixaban  (ELIQUIS ) 2.5 MG TABS tablet Take 1 tablet by mouth twice daily 04/04/24   Cloretta Arley NOVAK, MD  docusate sodium  (COLACE) 100 MG capsule Take 100 mg by  mouth daily as needed for mild constipation.    [provider]  empagliflozin (JARDIANCE) 10 MG TABS tablet Take 1 tablet by mouth daily. Patient not taking: Reported on 04/09/2024 10/26/23   [provider]  ferrous sulfate  325 (65 FE) MG EC tablet Take 1 tablet (325 mg total) by mouth 3 (three) times daily with meals. 11/06/19   Cloretta Arley NOVAK, MD  lidocaine -prilocaine  (EMLA ) cream Apply 1 Application topically as needed (Apply to port 1-2 hours before its use starting 2  weeks after it is placed). Patient not taking: Reported on 04/09/2024 04/01/24   Cloretta Arley NOVAK, MD  linagliptin (TRADJENTA) 5 MG TABS tablet Take 1 tablet by mouth daily. 02/17/24 04/09/24  [provider]  loperamide  (IMODIUM ) 2 MG capsule Take 2 mg by mouth daily as needed for diarrhea or loose stools.    [provider]  ondansetron  (ZOFRAN ) 8 MG tablet Take 1 tablet (8 mg total) by mouth every 8 (eight) hours as needed (may use 3 days after chemo as needed for nausea). 04/01/24   Cloretta Arley NOVAK, MD  predniSONE  (DELTASONE ) 5 MG tablet Take 1 tablet by mouth once daily with breakfast 03/11/24   Cloretta Arley NOVAK, MD  prochlorperazine  (COMPAZINE ) 10 MG tablet Take 1 tablet (10 mg total) by mouth every 6 (six) hours as needed for nausea or vomiting. 04/01/24   Cloretta Arley NOVAK, MD  rosuvastatin  (CRESTOR ) 10 MG tablet Take 10 mg by mouth every morning. 02/23/21   [provider]  tamsulosin  (FLOMAX ) 0.4 MG CAPS capsule Take 0.4 mg by mouth at bedtime. 05/02/16   [provider]  torsemide (DEMADEX) 10 MG tablet Take 10 mg by mouth daily. Patient not taking: Reported on 04/09/2024    [provider]    Physical Exam: Vitals:   04/22/24 1639  BP: (!) 119/58  Pulse: 79  Resp: 19  Temp: (!) 97.5 F (36.4 C)  TempSrc: Oral  SpO2: 100%    Constitutional: NAD, calm, comfortable.  Looks chronically ill and deconditioned. Vitals:   04/22/24 1639  BP: (!) 119/58  Pulse: 79  Resp: 19  Temp: (!) 97.5 F (36.4 C)  TempSrc: Oral  SpO2: 100%   Eyes: PERRL, lids and conjunctivae normal ENMT: Mucous membranes are dry.  Posterior pharynx clear of any exudate or lesions. Neck: normal, supple, no masses, no thyromegaly Respiratory: bilateral decreased breath sounds at bases, no wheezing, no crackles. Normal respiratory effort. No accessory muscle use.  Cardiovascular: S1 S2 positive, rate controlled.  Lower extremity edema present. 2+ pedal pulses.  Abdomen:  Tenderness in the left upper abdomen: No masses palpated. No hepatosplenomegaly. Bowel sounds positive.  Musculoskeletal: no clubbing / cyanosis. No joint deformity upper and lower extremities.  Skin: no rashes, lesions, ulcers. No induration Neurologic: CN 2-12 grossly intact. Moving extremities. No focal neurologic deficits.  Slow to respond.  Poor historian. Psychiatric: Flat affect.  Not agitated.   Labs on Admission: I have personally reviewed following labs and imaging studies  CBC: Recent Labs  Lab 04/19/24 0800 04/22/24 1215  WBC 4.1 1.9*  NEUTROABS 3.3 1.1*  HGB 10.2* 8.0*  HCT 32.8* 26.1*  MCV 95.1 95.3  PLT 90* 82*   Basic Metabolic Panel: Recent Labs  Lab 04/22/24 1215  NA 154*  K 3.8  CL 120*  CO2 14*  GLUCOSE 233*  BUN 108*  CREATININE 6.27*  CALCIUM  10.2   GFR: Estimated Creatinine Clearance: 10.1 mL/min (A) (by C-G formula based on SCr  of 6.27 mg/dL (H)). Liver Function Tests: Recent Labs  Lab 04/22/24 1215  AST 29  ALT 27  ALKPHOS 108  BILITOT 0.4  PROT 6.6  ALBUMIN 3.4*   No results for input(s): LIPASE, AMYLASE in the last 168 hours. No results for input(s): AMMONIA in the last 168 hours. Coagulation Profile: No results for input(s): INR, PROTIME in the last 168 hours. Cardiac Enzymes: No results for input(s): CKTOTAL, CKMB, CKMBINDEX, TROPONINI in the last 168 hours. BNP (last 3 results) No results for input(s): PROBNP in the last 8760 hours. HbA1C: No results for input(s): HGBA1C in the last 72 hours. CBG: No results for input(s): GLUCAP in the last 168 hours. Lipid Profile: No results for input(s): CHOL, HDL, LDLCALC, TRIG, CHOLHDL, LDLDIRECT in the last 72 hours. Thyroid  Function Tests: No results for input(s): TSH, T4TOTAL, FREET4, T3FREE, THYROIDAB in the last 72 hours. Anemia Panel: No results for input(s): VITAMINB12, FOLATE, FERRITIN, TIBC, IRON, RETICCTPCT in the last  72 hours. Urine analysis:    Component Value Date/Time   COLORURINE STRAW (A) 01/14/2022 2045   APPEARANCEUR HAZY (A) 01/14/2022 2045   LABSPEC 1.006 01/14/2022 2045   PHURINE 5.0 01/14/2022 2045   GLUCOSEU 50 (A) 01/14/2022 2045   HGBUR MODERATE (A) 01/14/2022 2045   BILIRUBINUR NEGATIVE 01/14/2022 2045   KETONESUR NEGATIVE 01/14/2022 2045   PROTEINUR 30 (A) 04/01/2022 0855   UROBILINOGEN 0.2 06/19/2007 1720   NITRITE NEGATIVE 01/14/2022 2045   LEUKOCYTESUR NEGATIVE 01/14/2022 2045    Radiological Exams on Admission: No results found.   Assessment/Plan  AKI on CKD stage IV Acute metabolic acidosis - Most recent creatinine on 04/09/2024 was 2.53.  Presented with creatinine of 6.27 and bicarb of 14.  Possibly prerenal from recent vomiting.  Patient was sent from cancer center what is going for direct admission and possible nephrology evaluation.  Communicated with Dr. Lin/nephrology over recommended IV bicarbonate drip.  Will start IV carbonate drip.  Check renal ultrasound to rule out any hydronephrosis.  Strict input and output.  Daily weights.  A.m. labs.  Hypernatremia - Possibly from dehydration.  IV fluids as above.  Monitor  Intractable nausea and vomiting - Unclear cause.  Had recent chemotherapy as well.  IV fluids, antiemetics.  IV Protonix  twice a day for now.  If symptoms do not improve, may need CT of the abdomen and GI evaluation.  Clear liquid diet.  Pancytopenia - Possibly from cancer and chemotherapy.  No signs of bleeding.  Monitor.  Metastatic small bowel carcinoma currently on chemotherapy with cycle 1 of 04/09/2024 History of metastatic prostate cancer and renal cell carcinoma status post right nephrectomy - Patient sent for admission by Dr. Cloretta from cancer center.  Outpatient follow-up with oncology.  Diabetes mellitus type 2 with hyperglycemia - CBGs with SSI.  A1c in AM  Hypertension - Monitor blood pressure.  Hold antihypertensives for  now  Hyperlipidemia -Hold statin for now because of vomiting  History of PE - Hold Eliquis  for now until renal imaging is available.  Use heparin  subcutaneous for prophylaxis  Physical deconditioning - PT eval  Goals of care - Overall prognosis is guarded to poor.  Consult palliative care for goals of care discussion.   DVT prophylaxis: Heparin  subcutaneous Code Status: Full Family Communication: Sisters at bedside Disposition Plan: Pending clinical improvement Consults called: Oncology/nephrology Admission status: Inpatient/admitted  Severity of Illness: The appropriate patient status for this patient is INPATIENT. Inpatient status is judged to be reasonable and necessary in order  to provide the required intensity of service to ensure the patient's safety. The patient's presenting symptoms, physical exam findings, and initial radiographic and laboratory data in the context of their chronic comorbidities is felt to place them at high risk for further clinical deterioration. Furthermore, it is not anticipated that the patient will be medically stable for discharge from the hospital within 2 midnights of admission.   * I certify that at the point of admission it is my clinical judgment that the patient will require inpatient hospital care spanning beyond 2 midnights from the point of admission due to high intensity of service, high risk for further deterioration and high frequency of surveillance required.DEWAINE Sophie Mao MD [facility]  04/22/2024, 5:05 PM

## 2024-04-22 NOTE — Patient Instructions (Signed)
 Rehydration, Older Adult  Rehydration is the replacement of fluids, salts, and minerals in the body (electrolytes) that are lost during dehydration. Dehydration is when there is not enough water or other fluids in the body. This happens when you lose more fluids than you take in. People who are age 67 or older have a higher risk of dehydration than younger adults. This is because in older age, the body: Is less able to maintain the right amount of water. Does not respond to temperature changes as well. Does not get a sense of thirst as easily or quickly. Other causes include: Not drinking enough fluids. This can occur when you are ill, when you forget to drink, or when you are doing activities that require a lot of energy, especially in hot weather. Conditions that cause loss of water or other fluids. These include diarrhea, vomiting, sweating, or urinating a lot. Other illnesses, such as fever or infection. Certain medicines, such as those that remove excess fluid from the body (diuretics). Symptoms of mild or moderate dehydration may include thirst, dry lips and mouth, and dizziness. Symptoms of severe dehydration may include increased heart rate, confusion, fainting, and not urinating. In severe cases, you may need to get fluids through an IV at the hospital. For mild or moderate cases, you can usually rehydrate at home by drinking certain fluids as told by your health care provider. What are the risks? Rehydration is usually safe. Taking in too much fluid (overhydration) can be a problem but is rare. Overhydration can cause an imbalance of electrolytes in the body, kidney failure, fluid in the lungs, or a decrease in salt (sodium) levels in the body. Supplies needed: You will need an oral rehydration solution (ORS) if your health care provider tells you to use one. This is a drink to treat dehydration. It can be found in pharmacies and retail stores. How to rehydrate Fluids Follow  instructions from your health care provider about what to drink. The kind of fluid and the amount you should drink depend on your condition. In general, you should choose drinks that you prefer. If told by your health care provider, drink an ORS. Make an ORS by following instructions on the package. Start by drinking small amounts, about  cup (120 mL) every 5-10 minutes. Slowly increase how much you drink until you have taken in the amount recommended by your health care provider. Drink enough clear fluids to keep your urine pale yellow. If you were told to drink an ORS, finish it first, then start slowly drinking other clear fluids. Drink fluids such as: Water. This includes sparkling and flavored water. Drinking only water can lead to having too little sodium in your body (hyponatremia). Follow the advice of your health care provider. Water from ice chips you suck on. Fruit juice with water added to it(diluted). Sports drinks. Hot or cold herbal teas. Broth-based soups. Coffee. Milk or milk products. Food Follow instructions from your health care provider about what to eat while you rehydrate. Your health care provider may recommend that you slowly begin eating regular foods in small amounts. Eat foods that contain a healthy balance of electrolytes, such as bananas, oranges, potatoes, tomatoes, and spinach. Avoid foods that are greasy or contain a lot of sugar. In some cases, you may get nutrition through a feeding tube that is passed through your nose and into your stomach (nasogastric tube, or NG tube). This may be done if you have uncontrolled vomiting or diarrhea. Drinks to avoid  Certain drinks may make dehydration worse. While you rehydrate, avoid drinking alcohol. How to tell if you are recovering from dehydration You may be getting better if: You are urinating more often than before you started rehydrating. Your urine is pale yellow. Your energy level improves. You vomit less  often. You have diarrhea less often. Your appetite improves or returns to normal. You feel less dizzy or light-headed. Your skin tone and color start to look more normal. Follow these instructions at home: Take over-the-counter and prescription medicines only as told by your health care provider. Do not take sodium tablets. Doing this can lead to having too much sodium in your body (hypernatremia). Contact a health care provider if: You continue to have symptoms of mild or moderate dehydration, such as: Thirst. Dry lips. Slightly dry mouth. Dizziness. Dark urine or less urine than usual. Muscle cramps. You continue to vomit or have diarrhea. Get help right away if: You have symptoms of dehydration that get worse. You have a fever. You have a severe headache. You have been vomiting and have problems, such as: Your vomiting gets worse. Your vomit includes blood or green matter (bile). You cannot eat or drink without vomiting. You have problems with urination or bowel movements, such as: Diarrhea that gets worse. Blood in your stool (feces). This may cause stool to look black and tarry. Not urinating, or urinating only a small amount of very dark urine, within 6-8 hours. You have trouble breathing. You have symptoms that get worse with treatment. These symptoms may be an emergency. Get help right away. Call 911. Do not wait to see if the symptoms will go away. Do not drive yourself to the hospital. This information is not intended to replace advice given to you by your health care provider. Make sure you discuss any questions you have with your health care provider. Document Revised: 01/26/2022 Document Reviewed: 01/24/2022 Elsevier Patient Education  2024 [employer]  Nausea, Adult Nausea is the feeling of having an upset stomach or that you are about to vomit. Nausea on its own is not usually a serious concern, but it may be an early sign of a more serious medical problem. As  nausea gets worse, it can lead to vomiting. If vomiting develops, or if you are not able to drink enough fluids, you are at risk of becoming dehydrated. Dehydration can make you tired and thirsty, cause you to have a dry mouth, and decrease how often you urinate. Older adults and people with other diseases or a weak disease-fighting system (immune system) are at higher risk for dehydration. The main goals of treating your nausea are: To relieve your nausea. To limit repeated nausea episodes. To prevent vomiting and dehydration. Follow these instructions at home: Watch your symptoms for any changes. Tell your health care provider about them. Eating and drinking     Take an oral rehydration solution (ORS). This is a drink that is sold at pharmacies and retail stores. Drink clear fluids slowly and in small amounts as you are able. Clear fluids include water, ice chips, low-calorie sports drinks, and fruit juice that has water added (diluted fruit juice). Eat bland, easy-to-digest foods in small amounts as you are able. These foods include bananas, applesauce, rice, lean meats, toast, and crackers. Avoid drinking fluids that contain a lot of sugar or caffeine, such as energy drinks, sports drinks, and soda. Avoid alcohol. Avoid spicy or fatty foods. General instructions Take over-the-counter and prescription medicines only as told by  your health care provider. Rest at home while you recover. Drink enough fluid to keep your urine pale yellow. Breathe slowly and deeply when you feel nauseous. Avoid smelling things that have strong odors. Wash your hands often using soap and water for at least 20 seconds. If soap and water are not available, use hand sanitizer. Make sure that everyone in your household washes their hands well and often. Keep all follow-up visits. This is important. Contact a health care provider if: Your nausea gets worse. Your nausea does not go away after two days. You vomit  multiple times. You cannot drink fluids without vomiting. You have any of the following: New symptoms. A fever. A headache. Muscle cramps. A rash. Pain while urinating. You feel light-headed or dizzy. Get help right away if: You have pain in your chest, neck, arm, or jaw. You feel extremely weak or you faint. You have vomit that is bright red or looks like coffee grounds. You have bloody or black stools (feces) or stools that look like tar. You have a severe headache, a stiff neck, or both. You have severe pain, cramping, or bloating in your abdomen. You have difficulty breathing or are breathing very quickly. Your heart is beating very quickly. Your skin feels cold and clammy. You feel confused. You have signs of dehydration, such as: Dark urine, very little urine, or no urine. Cracked lips. Dry mouth. Sunken eyes. Sleepiness. Weakness. These symptoms may be an emergency. Get help right away. Call 911. Do not wait to see if the symptoms will go away. Do not drive yourself to the hospital. Summary Nausea is the feeling that you have an upset stomach or that you are about to vomit. Nausea on its own is not usually a serious concern, but it may be an early sign of a more serious medical problem. If vomiting develops, or if you are not able to drink enough fluids, you are at risk of becoming dehydrated. Follow recommendations for eating and drinking and take over-the-counter and prescription medicines only as told by your health care provider. Contact a health care provider right away if your symptoms worsen or you have new symptoms. Keep all follow-up visits. This is important. This information is not intended to replace advice given to you by your health care provider. Make sure you discuss any questions you have with your health care provider. Document Revised: 03/19/2021 Document Reviewed: 03/19/2021 Elsevier Patient Education  2024 [employer].

## 2024-04-22 NOTE — Consult Note (Signed)
 [facility] Nephrology Consultation Note  Requesting MD: Dr. Cheryle, Kshitiz Reason for consult: AKI on CKD  HPI:  Brandon Peters is a 68 y.o. 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 It seems like the patient has CKD stage IV with baseline creatinine level around 2.4 and follows with a nephrologist at [facility].  He was admitted to their in 01/2024 for AKI on CKD when he was treated with IV fluid.  He was a started on chemotherapy FOLFOX on 04/09/2024 causing nausea, vomiting and unable to take food down.  He had lab done at the cancer center today which showed sodium of 154, potassium 3.8, CO2 14, BUN 108, creatinine 6.27, serum albumin 3.4, WBC 1.9, hemoglobin 8, platelet 82.  He was started on D5 sodium bicarbonate  fluid and admitted for further evaluation. The patient said he is not feeling well since starting chemotherapy.  He feels fatigued, loss of energy, nausea vomiting and not able to take food or liquid down.  Denies diarrhea.  He reports passing urine okay. He was accompanied by his 2 sisters at the bedside.  I have reviewed the record from [facility].  PMHx:   Past Medical History:  Diagnosis Date   ARF (acute renal failure) (HCC)    d/t lisinopril and/or diazide- was on both in a 2 week period- cr up to 4.99   Diabetes mellitus    Family history of lung cancer    Hyperlipidemia    Hypertension    Personal history of other malignant neoplasm of small intestine    RENAL CALCULUS, HX OF 06/26/2007    Past Surgical History:  Procedure Laterality Date   COLONOSCOPY N/A 03/04/2016   Procedure: COLONOSCOPY;  Surgeon: Belvie Just, MD;  Location: [facility] ENDOSCOPY;   Service: Endoscopy;  Laterality: N/A;   ENTEROSCOPY N/A 03/04/2016   Procedure: ENTEROSCOPY;  Surgeon: Belvie Just, MD;  Location: [facility] ENDOSCOPY;  Service: Endoscopy;  Laterality: N/A;   IR IMAGING GUIDED PORT INSERTION  04/08/2024    Family Hx:  Family History  Problem Relation Age of Onset   Cancer Mother    Diabetes Mother    Hypertension Mother    Coronary artery disease Mother    Hyperlipidemia Sister    Hypertension Sister     Social History:  reports that he has never smoked. He has never used smokeless tobacco. He reports that he does not drink alcohol and does not use drugs.  Allergies:  Allergies  Allergen Reactions   Penicillins Other (See Comments)    Pt states that he cannot really remember reaction. Tentatively reports swelling/hives    Medications: Prior to Admission medications   Medication Sig Start Date End Date Taking? Authorizing Provider  abiraterone  acetate (ZYTIGA ) 250 MG tablet TAKE 4 TABLETS BY MOUTH ONCE DAILY AS DIRECTED.  TAKE 1 HOUR BEFORE OR 2 HOURS AFTER A MEAL 11/23/23   Cloretta Arley NOVAK, MD  amLODipine  (NORVASC ) 10 MG tablet Take 10 mg by mouth daily. 04/21/16   [provider]  apixaban  (ELIQUIS ) 2.5 MG TABS tablet Take 1 tablet by mouth twice daily 04/04/24   Cloretta Arley NOVAK, MD  docusate sodium  (COLACE) 100 MG capsule Take 100 mg by mouth daily as needed for mild constipation.  [provider]  empagliflozin (JARDIANCE) 10 MG TABS tablet Take 1 tablet by mouth daily. Patient not taking: Reported on 04/09/2024 10/26/23   [provider]  ferrous sulfate  325 (65 FE) MG EC tablet Take 1 tablet (325 mg total) by mouth 3 (three) times daily with meals. 11/06/19   Cloretta Arley NOVAK, MD  lidocaine -prilocaine  (EMLA ) cream Apply 1 Application topically as needed (Apply to port 1-2 hours before its use starting 2 weeks after it is placed). Patient not taking: Reported on 04/09/2024 04/01/24   Cloretta Arley NOVAK, MD  linagliptin  (TRADJENTA) 5 MG TABS tablet Take 1 tablet by mouth daily. 02/17/24 04/09/24  [provider]  loperamide  (IMODIUM ) 2 MG capsule Take 2 mg by mouth daily as needed for diarrhea or loose stools.    [provider]  ondansetron  (ZOFRAN ) 8 MG tablet Take 1 tablet (8 mg total) by mouth every 8 (eight) hours as needed (may use 3 days after chemo as needed for nausea). 04/01/24   Cloretta Arley NOVAK, MD  predniSONE  (DELTASONE ) 5 MG tablet Take 1 tablet by mouth once daily with breakfast 03/11/24   Cloretta Arley NOVAK, MD  prochlorperazine  (COMPAZINE ) 10 MG tablet Take 1 tablet (10 mg total) by mouth every 6 (six) hours as needed for nausea or vomiting. 04/01/24   Cloretta Arley NOVAK, MD  rosuvastatin  (CRESTOR ) 10 MG tablet Take 10 mg by mouth every morning. 02/23/21   [provider]  tamsulosin  (FLOMAX ) 0.4 MG CAPS capsule Take 0.4 mg by mouth at bedtime. 05/02/16   [provider]  torsemide (DEMADEX) 10 MG tablet Take 10 mg by mouth daily. Patient not taking: Reported on 04/09/2024    [provider]    I have reviewed the patient's current medications.  Labs: Renal Panel: Recent Labs  Lab 04/22/24 1215  NA 154*  K 3.8  CL 120*  CO2 14*  GLUCOSE 233*  BUN 108*  CREATININE 6.27*  CALCIUM  10.2     CBC:    Latest Ref Rng & Units 04/22/2024   12:15 PM 04/19/2024    8:00 AM 04/09/2024   11:35 AM  CBC  WBC 4.0 - 10.5 K/uL 1.9  4.1  8.6   Hemoglobin 13.0 - 17.0 g/dL 8.0  89.7  7.5   Hematocrit 39.0 - 52.0 % 26.1  32.8  25.4   Platelets 150 - 400 K/uL 82  90  218      Anemia Panel:  Recent Labs    07/05/23 1305 07/05/23 1306 08/09/23 1007 02/22/24 1123 03/07/24 0835 03/12/24 1021 03/28/24 1342 04/09/24 1135 04/19/24 0800 04/22/24 1215  HGB 8.6*  --    < > 8.2*   < > 8.4* 8.6* 7.5* 10.2* 8.0*  MCV 98.3  --    < > 97.9   < > 99.3 99.7 99.6 95.1 95.3  FERRITIN  --  56  --  945*  --   --   --   --   --   --   TIBC 305  --   --   --   --   --   --   --    --   --   IRON 79  --   --   --   --   --   --   --   --   --   RETICCTPCT 2.6  --   --   --   --   --   --   --   --   --    < > =  values in this interval not displayed.    Recent Labs  Lab 04/22/24 1215  AST 29  ALT 27  ALKPHOS 108  BILITOT 0.4  PROT 6.6  ALBUMIN 3.4*    Lab Results  Component Value Date   HGBA1C 6.2 (H) 01/16/2022    ROS:  Pertinent items noted in HPI and remainder of comprehensive ROS otherwise negative.  Physical Exam: Vitals:   04/22/24 1639  BP: (!) 119/58  Pulse: 79  Resp: 19  Temp: (!) 97.5 F (36.4 C)  SpO2: 100%     General exam: Appears calm and comfortable  Respiratory system: Clear to auscultation. Respiratory effort normal. No wheezing or crackle Cardiovascular system: S1 & S2 heard, RRR.  Bilateral pedal edema+. Gastrointestinal system: Abdomen is nondistended, soft and nontender. Normal bowel sounds heard. Central nervous system: Alert and oriented. No focal neurological deficits. Extremities: Bilateral lower extremities pitting edema present, no cyanosis. Skin: No rashes, lesions or ulcers Psychiatry: Judgement and insight appear normal. Mood & affect appropriate.   Assessment/Plan:  # Acute kidney injury on CKD stage IV in solitary kidney likely due to intravascular volume depletion/reduced renal perfusion caused by recent nausea vomiting, decreased oral intake due to chemotherapy.  Patient has only solitary kidney with poor renal reserve and history of obstructive uropathy. -I will check UA, urine sodium and bladder scan. -Await kidney ultrasound result to rule out obstruction. -Agree with continuing sodium bicarbonate  fluid, no bolus because of peripheral edema.  He has probably fluid third spacing. -Strict ins and outs and daily lab. -Hopefully the kidney function will improve.  We may need oncology or palliative care consult to address goals of care given metastatic malignancy and possibly of needing dialysis soon.  #  Hyponatremia with free water deficit: Currently on sodium bicarbonate  fluid with dextrose .  I encouraged him to increase free water intake.  Monitor lab.  # Anion gap metabolic acidosis: On IV sodium bicarbonate , follow labs.  # Pancytopenia due to recent chemotherapy, per primary and oncology team.  Thank you for the consult.  We will continue to follow.  Brandon Peters Amelie Romney 04/22/2024, 7:26 PM  [facility].

## 2024-04-22 NOTE — Progress Notes (Signed)
 Per Devere Leaven, RN: Genna to cancel appointments scheduled for Tuesday 04/23/2024 as patient is getting admitted today 04/22/2024.

## 2024-04-22 NOTE — Progress Notes (Signed)
@   1530 Assisted patient to car w/sister driving for admission to [facility] for renal failure, dehydration, nausea, regurgitation, renal, small bowel and prostate cancer. He left is stable condition. @ 1545 Called report to Therapist, sports on unit.

## 2024-04-22 NOTE — Progress Notes (Signed)
 Brandon Peters OFFICE PROGRESS NOTE   Diagnosis: Prostate cancer, renal cell carcinoma, small bowel cancer  INTERVAL HISTORY:   Brandon Peters returns prior to scheduled follow-up due to dysphagia.  He reports inability to tolerate solids, liquids, medications over the past approximate 1 week.  Fairly soon after swallowing he regurgitates.  No mouth sores.  No throat soreness.  No diarrhea.  No hand or foot pain or redness.  Objective:  Vital signs in last 24 hours:  Temperature 97.9, heart rate 86, respirations 18, blood pressure 114/73, oxygen saturation 100% on room air    HEENT: White coating over tongue.  No ulcers. Resp: Lungs clear bilaterally. Cardio: Regular rate and rhythm. GI: No hepatomegaly.  Tender left upper abdomen. Vascular: Pitting edema throughout both legs. Neuro: Alert and oriented. Skin: Skin turgor is decreased. Port-A-Cath without erythema.  Lab Results:  Lab Results  Component Value Date   WBC 1.9 (L) 04/22/2024   HGB 8.0 (L) 04/22/2024   HCT 26.1 (L) 04/22/2024   MCV 95.3 04/22/2024   PLT 82 (L) 04/22/2024   NEUTROABS 1.1 (L) 04/22/2024    Imaging:  No results found.  Medications: I have reviewed the patient's current medications.  Assessment/Plan: History of Severe anemia-status post a red cell transfusion 03/04/2016 2.. Prostate cancer Extensive sclerotic bone metastases noted on a CT of the abdomen/pelvis 03/04/2016 and MRI abdomen 03/05/2016 Markedly elevated PSA Lupron  initiated 03/15/2016; Casodex  14 days beginning 03/15/2016 Prostate biopsy 03/18/2016 05/09/2016 PSA significantly improved at 21 Abiraterone  initiated 05/10/2016 PSA less than 0.1 07/10/2018 PSA less than 0.1 08/30/2018 PSA less than 0.1 10/18/2018 PSA less than 0.1 11/29/2018 PSA less than 0.1 on 11/06/2019  PSA less than 0.1 on 03/05/2020 PSA less than 0.1 on 07/06/2020 PSA less than 0.1 08/17/2021 PSA less than 0.1 on 09/30/2021 PSA 0.1 on 11/12/2021 PSA  0.1 on 02/18/2022 PSA 0.2 on 12/09/2022 PSA 0.3 on 04/04/2023  PSA 0.4 on 06/06/2023 PSA 0.5 on 07/05/2023 PSA 0.7 on 08/09/2023 PSA 1.5 on 12/07/2023 PSA 4.7 on 02/06/2024 PSA 6.37 on 02/14/2024 PSA 5.0 on 02/22/2024 PSA 7.3 on 03/28/2024     3.   Symptoms of obstructive uropathy. Improved   4.   Distal duodenal and descending colon masses biopsy of the duodenal mass 03/04/2016 confirmed fragments of small bowel mucosa with focal high-grade dysplasia Biopsy of the descending colon polyps revealed fragments of a tubulovillous adenoma CT 08/29/2017-similar soft tissue thickening in the region of the splenic flexure of the colon suboptimally evaluated due to lack of colonic enteric contrast Referred to Dr. Rollin Referred to [facility] Resection of descending colon polyps 01/31/2018, tubulovillous adenomas with high-grade dysplasia Attempted endoscopic resection of the fourth segment duodenal polyp on 05/09/2018, firm unresectable lesion, biopsy revealed moderately differentiated adenocarcinoma CT abdomen/pelvis 05/25/2018- heterogeneously enhancing right lower pole renal lesion; diffuse sclerotic osseous metastatic lesions compatible with history of prostate cancer CT chest 05/25/2018- no pulmonary metastasis.  Diffuse osseous metastasis involving the axial and appendicular skeleton. 06/19/2018-sleeve resection of third and fourth portions of duodenum with primary end-to-end anastomosis, feeding gastrojejunostomy tube placement, Dr. Ames; poorly differentiated adenocarcinoma duodenum, 3 cm; tumor invades the muscularis propria; lymphovascular invasion present; negative margins; 4 of 11 involved lymph nodes; soft tissue deposits also seen in the mesentery; all mucosal margins and soft tissue margins negative but 2 of the positive nodes are found in the soft tissue margin submitted for frozen section (pT2 pN2); second mucosal lesion 1.2 cm from the proximal margin, tubular adenoma with focal  severe dysplasia Intact  expression of mismatch repair proteins by IHC; microsatellite stable Cycle 1 adjuvant Xeloda  07/23/2018 Cycle 2 adjuvant Xeloda  08/13/2018 Cycle 3 adjuvant Xeloda  09/10/2018 (dose reduced to 1000 mg twice daily for 14 days)  Cycle 4 adjuvant Xeloda  10/01/2018 (dose increased to 1500 mg every morning and 1000 mg every afternoon for 14 days) Cycle 5 adjuvant Xeloda  10/22/2018 Cycle 6 adjuvant Xeloda  11/12/2018 Cycle 7 adjuvant Xeloda  12/03/2018 Cycle 8 adjuvant Xeloda  12/24/2018   5.  Anorexia/weight loss- resolved   6.  Undescended testicles   7.  Right renal mass.  CT 08/29/2017-right renal mass involving the lower pole was partially calcified and does demonstrate enhancement following contrast most consistent with renal cell carcinoma.  Lesion not significantly enlarged compared with the prior CT. Followed by urology. 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 to the IVC/RA junction, upper normal size retrocaval node 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, nuclear grade 3 Axitinib /pembrolizumab  10/22/2021 Axitinib  placed on hold 11/05/2021 due to hand-foot syndrome Axitinib  resumed at a dose of 5 mg daily 11/12/2021, pembrolizumab  11/12/2021 Axitinib  discontinued 12/03/2021 secondary to progressive hand-foot syndrome, pembrolizumab  12/03/2021 Axitinib  resumed at a reduced dose of 2 mg twice daily after office visit 12/14/2021 Pembrolizumab  12/24/2021 Axitinib  stopped  01/03/2022 secondary to persistent hand/foot symptoms CT chest at [facility] 01/13/2022-no evidence of thoracic metastatic disease, similar diffuse sclerotic change in the skeleton MRI abdomen at [facility] 01/13/2022-similar size of right lower pole renal mass with renal vein thrombosis, decreased overall extent of thrombus now extending to the intrahepatic IVC, enhancement of right renal mass and thrombus is decreased, new left hydroureter ureter nephrosis with asymmetric perinephric stranding Pembrolizumab  01/27/2022 Axitinib  resumed 2 mg daily 01/28/2022 CT abdomen/pelvis at [facility] 02/10/2022-overall reduced size of right lower pole renal mass which demonstrates persistent region of enhancement along the posterior and lateral margin with extension into the renal sinus.  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  Query additional IVC thrombus at the intrahepatic IVC and extending into the right atrium. Axitinib  continued 2 mg daily, Pembrolizumab  every 3 weeks CT 05/05/2022 at [facility]-partially visualized pulmonary embolism involving segmental arteries of the right lower lobe.  Interval slight decrease in size of the mass arising from the lower pole the right kidney.  Mass demonstrates persistent moderate necrosis centrally.  Increased enhancing soft tissue component measuring up to 2 cm which is mildly increased in size compared to prior from 02/10/2022.  Bland tumor thrombus identified in the IVC at the level of the right renal vein ostium extending into the suprarenal IVC superiorly.  No obvious thrombus identified in the infrarenal, intrahepatic and suprahepatic IVC.  Likely trace bland thrombus in the anterior right renal vein.  Stable several subcentimeter retroperitoneal lymph nodes.  Diffuse osseous sclerotic and lytic metastatic disease stable compared to prior. Axitinib  placed on hold 05/04/2022 due to pain bilateral feet; 05/13/2022 he will continue to hold axitinib  Pembrolizumab   05/13/2022 Pembrolizumab  06/03/2022 Robotic right radical nephrectomy and IVC thrombectomy 06/27/2022-right kidney with tumor necrosis/hemorrhage,ypT0, adherent thrombus with cellular necrosis and  no viable carcinoma present at the renal vein margin, other margins negative for carcinoma, 0/3 lymph nodes.  6.5 cm tumor including intraparenchymal lesion and thrombus in the renal vein 8.  Diabetes   9.  Anemia-stool Hemoccults + March 2021, referred to Dr. Rollin for endoscopic evaluation, anemia also secondary to metastatic prostate cancer, hypogonadism Small bowel enteroscopy 02/11/2020-esophagus normal.  Hematin found in the gastric body.  Examined duodenum normal.  No evidence of significant pathology in the entire examined portion of the jejunum.  There was no overt source of bleeding or inflammation.  The bleeding may be from small AVMs.  Surgical anastomosis was not visible with repeated inspection of the duodenum. Colonoscopy 02/11/2020-multiple sessile and semipedunculated polyps were found in the rectum, descending colon and ascending colon, 3 to 18 mm in size.  Polyps removed (polypectomy descending colon-fragments of tubular adenoma, inflammatory type polyp; polypectomy ascending colon-tubular adenoma, no high-grade dysplasia or malignancy; polypectomy descending colon-fragments of tubulovillous adenoma, no high-grade dysplasia or malignancy; polypectomy colon, rectum-tubular adenoma, no high-grade dysplasia or malignancy). Colonoscopy 02/02/2021-seven 2 to 10 mm polyps in the sigmoid colon (tubular adenoma), descending colon (fragments of tubulovillous adenoma, fragments of tubular adenoma ) and transverse colon (fragments of tubular adenoma). Progressive anemia 07/05/2021; stool cards positive Upper endoscopy/colonoscopy 09/23/2021-multiple polyps removed from the colon-tubular adenomas, diffuse angioectasias in the stomach with bleeding-not amenable to endoscopic treatment 10.  Renal  failure-progressive 01/17/2022, progressive following right nephrectomy October 2023 11.  CT chest at [facility] 01/13/2022-no evidence of thoracic metastatic disease, similar diffuse sclerotic changes in the skeleton 12.  CT renal stone study 01/14/2022-mild left side hydroureteronephrosis secondary to a left UVJ stone, left nephrolithiasis, new right middle lobe collapse/consolidation, edema/fluid at the left iliopsoas muscle 13.  PE noted on CT 05/05/2022-on apixaban .  He saw Dr. Darcy at [facility] 05/16/2022; instructions are to hold apixaban  72 hours prior to scheduled surgery and start Lovenox at prophylactic dosing. 14.  Metastatic small bowel carcinoma  CTs at [facility] 02/13/2024-new periaortic lymphadenopathy and peritoneal and omental carcinomatosis, small amount of new ascites, similar-appearing diffuse bony metastatic disease, focal thickening of the gastric body Omental biopsy 03/07/2024-metastatic moderately differentiated adenocarcinoma compatible with GI/intestinal primary; foundation 1 microsatellite stable, tumor mutation burden 1, ERBB2 amplification equivocal, NRAS Q61H; HER2 positive FISH CT 03/21/2024-similar abdominal retroperitoneal nodal metastasis.  1 node measuring minimally larger and the other minimally smaller.  Similar omental/peritoneal metastasis with mild increase in small volume abdominopelvic ascites.  Similar diffuse osseous metastasis.  New tiny left pleural effusion. Cycle 1 FOLFOX 04/09/2024  Disposition: Mr. Ashford has metastatic small bowel carcinoma.  He completed cycle 1 FOLFOX 04/09/2024.  For the past approximate 1 week he has been unable to tolerate anything by mouth due to nausea/vomiting/regurgitation.  He has acute on chronic renal failure.  Arrangements are being made for hospital admission.  Patient seen with Dr. Cloretta.    Brandon Peters ANP/GNP-BC   04/22/2024  12:32 PM  Mr. Geisen was interviewed and examined.  He is now at day 14 following cycle 1 FOLFOX.  He presents  with nausea/vomiting.  A chemistry panel is remarkable for evidence of progressive renal failure with severe hypernatremia.  The nausea could be related to renal failure or a GI process.  He is at risk for bowel obstruction secondary to carcinomatosis.  Mr. Schewe will be admitted for further evaluation and supportive care.  I discussed CODE STATUS with Mr. Maute.  His sister was present.  He would like to remain on a full  CODE STATUS.   I was present for greater than 50% of today's visit.  I performed medical decision making.  Arvella Hof, MD

## 2024-04-22 NOTE — Telephone Encounter (Signed)
 Call from sister and Mr. Ballentine: Beginning 7/25 having trouble swallowing, sometimes even liquids and pills. Vomited x1 yesterday and this am as well. His nausea pills won't go down and trouble even with liquids now. He will try to swallow and it will come back up. Throat is getting sore and he thinks he is getting sores in his mouth. Denies any fever.

## 2024-04-22 NOTE — Progress Notes (Signed)
 Patient being transported by family member to [facility]. Right port accessed and saline locked. Bio patch applied. VSS.

## 2024-04-23 ENCOUNTER — Inpatient Hospital Stay

## 2024-04-23 ENCOUNTER — Inpatient Hospital Stay: Admitting: Oncology

## 2024-04-23 ENCOUNTER — Inpatient Hospital Stay (HOSPITAL_COMMUNITY)

## 2024-04-23 DIAGNOSIS — N179 Acute kidney failure, unspecified: Secondary | ICD-10-CM | POA: Diagnosis not present

## 2024-04-23 LAB — CBC WITH DIFFERENTIAL/PLATELET
Abs Immature Granulocytes: 0.01 K/uL (ref 0.00–0.07)
Basophils Absolute: 0 K/uL (ref 0.0–0.1)
Basophils Relative: 0 %
Eosinophils Absolute: 0 K/uL (ref 0.0–0.5)
Eosinophils Relative: 2 %
HCT: 20.6 % — ABNORMAL LOW (ref 39.0–52.0)
Hemoglobin: 6.5 g/dL — CL (ref 13.0–17.0)
Immature Granulocytes: 1 %
Lymphocytes Relative: 49 %
Lymphs Abs: 0.5 K/uL — ABNORMAL LOW (ref 0.7–4.0)
MCH: 29.5 pg (ref 26.0–34.0)
MCHC: 31.6 g/dL (ref 30.0–36.0)
MCV: 93.6 fL (ref 80.0–100.0)
Monocytes Absolute: 0.1 K/uL (ref 0.1–1.0)
Monocytes Relative: 15 %
Neutro Abs: 0.3 K/uL — CL (ref 1.7–7.7)
Neutrophils Relative %: 33 %
Platelets: 74 K/uL — ABNORMAL LOW (ref 150–400)
RBC: 2.2 MIL/uL — ABNORMAL LOW (ref 4.22–5.81)
RDW: 15.2 % (ref 11.5–15.5)
Smear Review: NORMAL
WBC: 1 K/uL — CL (ref 4.0–10.5)
nRBC: 0 % (ref 0.0–0.2)

## 2024-04-23 LAB — URINALYSIS, ROUTINE W REFLEX MICROSCOPIC
Bilirubin Urine: NEGATIVE
Glucose, UA: NEGATIVE mg/dL
Ketones, ur: NEGATIVE mg/dL
Nitrite: NEGATIVE
Protein, ur: NEGATIVE mg/dL
Specific Gravity, Urine: 1.011 (ref 1.005–1.030)
pH: 5 (ref 5.0–8.0)

## 2024-04-23 LAB — COMPREHENSIVE METABOLIC PANEL WITH GFR
ALT: 19 U/L (ref 0–44)
AST: 24 U/L (ref 15–41)
Albumin: 2.1 g/dL — ABNORMAL LOW (ref 3.5–5.0)
Alkaline Phosphatase: 64 U/L (ref 38–126)
Anion gap: 10 (ref 5–15)
BUN: 110 mg/dL — ABNORMAL HIGH (ref 8–23)
CO2: 24 mmol/L (ref 22–32)
Calcium: 8.6 mg/dL — ABNORMAL LOW (ref 8.9–10.3)
Chloride: 116 mmol/L — ABNORMAL HIGH (ref 98–111)
Creatinine, Ser: 5.8 mg/dL — ABNORMAL HIGH (ref 0.61–1.24)
GFR, Estimated: 10 mL/min — ABNORMAL LOW (ref >60)
Glucose, Bld: 211 mg/dL — ABNORMAL HIGH (ref 70–99)
Potassium: 2.9 mmol/L — ABNORMAL LOW (ref 3.5–5.1)
Sodium: 150 mmol/L — ABNORMAL HIGH (ref 135–145)
Total Bilirubin: 0.5 mg/dL (ref 0.0–1.2)
Total Protein: 5.1 g/dL — ABNORMAL LOW (ref 6.5–8.1)

## 2024-04-23 LAB — BASIC METABOLIC PANEL WITH GFR
Anion gap: 11 (ref 5–15)
BUN: 106 mg/dL — ABNORMAL HIGH (ref 8–23)
CO2: 25 mmol/L (ref 22–32)
Calcium: 8.5 mg/dL — ABNORMAL LOW (ref 8.9–10.3)
Chloride: 115 mmol/L — ABNORMAL HIGH (ref 98–111)
Creatinine, Ser: 5.72 mg/dL — ABNORMAL HIGH (ref 0.61–1.24)
GFR, Estimated: 10 mL/min — ABNORMAL LOW (ref >60)
Glucose, Bld: 246 mg/dL — ABNORMAL HIGH (ref 70–99)
Potassium: 2.8 mmol/L — ABNORMAL LOW (ref 3.5–5.1)
Sodium: 151 mmol/L — ABNORMAL HIGH (ref 135–145)

## 2024-04-23 LAB — MAGNESIUM: Magnesium: 2.4 mg/dL (ref 1.7–2.4)

## 2024-04-23 LAB — GLUCOSE, CAPILLARY
Glucose-Capillary: 240 mg/dL — ABNORMAL HIGH (ref 70–99)
Glucose-Capillary: 170 mg/dL — ABNORMAL HIGH (ref 70–99)
Glucose-Capillary: 147 mg/dL — ABNORMAL HIGH (ref 70–99)

## 2024-04-23 LAB — OSMOLALITY, URINE: Osmolality, Ur: 408 mosm/kg (ref 300–900)

## 2024-04-23 LAB — SODIUM, URINE, RANDOM: Sodium, Ur: 33 mmol/L

## 2024-04-23 LAB — HEMOGLOBIN A1C
Hgb A1c MFr Bld: 7.2 % — ABNORMAL HIGH (ref 4.8–5.6)
Mean Plasma Glucose: 159.94 mg/dL

## 2024-04-23 LAB — PREPARE RBC (CROSSMATCH)

## 2024-04-23 MED ORDER — SODIUM CHLORIDE 0.9% FLUSH
10.0000 mL | Freq: Two times a day (BID) | INTRAVENOUS | Status: DC
  Administered 2024-04-24 – 2024-04-25 (×2): 10 mL

## 2024-04-23 MED ORDER — POTASSIUM CHLORIDE CRYS ER 20 MEQ PO TBCR
40.0000 meq | EXTENDED_RELEASE_TABLET | ORAL | Status: AC
  Administered 2024-04-23: 40 meq via ORAL
  Filled 2024-04-23: qty 2

## 2024-04-23 MED ORDER — CHLORHEXIDINE GLUCONATE CLOTH 2 % EX PADS
6.0000 | MEDICATED_PAD | Freq: Every day | CUTANEOUS | Status: DC
  Administered 2024-04-23 – 2024-04-26 (×4): 6 via TOPICAL

## 2024-04-23 MED ORDER — SODIUM CHLORIDE 0.9% IV SOLUTION: Freq: Once | INTRAVENOUS | Status: DC

## 2024-04-23 MED ORDER — BOOST / RESOURCE BREEZE PO LIQD CUSTOM
1.0000 | Freq: Three times a day (TID) | ORAL | Status: DC
  Administered 2024-04-23 – 2024-04-27 (×7): 1 via ORAL

## 2024-04-23 MED ORDER — SODIUM CHLORIDE 0.9 % IV SOLN
1.0000 g | INTRAVENOUS | Status: DC
  Administered 2024-04-23 – 2024-04-25 (×3): 1 g via INTRAVENOUS
  Filled 2024-04-23 (×3): qty 10

## 2024-04-23 MED ORDER — SODIUM CHLORIDE 0.9% FLUSH: 10.0000 mL | INTRAVENOUS | Status: DC | PRN

## 2024-04-23 NOTE — Progress Notes (Signed)
 PROGRESS NOTE    Brandon Peters  FMW:[id number] DOB: 09-27-1955 DOA: 04/22/2024 PCP: Bari Morgans, NP   Brief Narrative:  68 y.o. male with medical history significant of CKD stage IV (baseline of around 2.5), metastatic prostate cancer, renal cell carcinoma status post right nephrectomy in October 2023, metastatic small bowel carcinoma currently on chemotherapy with cycle 1 of FOLFOX on 04/09/2024, anemia of chronic disease, PE on Eliquis , hypertension, hyperlipidemia, diabetes mellitus type 2, admission at [facility] in 01/2024 for AKI on CKD stage IV treated with IV fluids and holding diuretics was sent from cancer center for direct admission for nausea and vomiting. At cancer Center, sodium was 154, bicarb of 14, creatinine of 6.27 creatinine on 04/09/2024 was 2.53) WBC of 1.9, hemoglobin of 8, platelets of 82. He was transferred to [facility] because of severe renal failure and need for possible nephrology evaluation.  He was started on IV bicarbonate drip.  Nephrology was consulted.  Assessment & Plan:   AKI on CKD stage IV Acute metabolic acidosis - Most recent creatinine on 04/09/2024 was 2.53.  Presented with creatinine of 6.27 and bicarb of 14.  Possibly prerenal from recent vomiting.  Patient was sent from cancer center for direct admission and possible nephrology evaluation.   - Currently on IV bicarbonate drip.  Renal ultrasound did not show left-sided hydronephrosis but showed complex moderate perihepatic and perisplenic ascites: Will get CT of the abdomen without contrast.   - Nephrology following.  Labs pending this morning.  Monitor. -Strict input and output.  Daily weights.  Avoid nephrotoxic medications  Hypernatremia - Possibly from dehydration.  IV fluids as per nephrology.  Monitor.  Labs pending today.  Intractable nausea and vomiting - Unclear cause.  Had recent chemotherapy as well.  IV fluids, antiemetics.  IV Protonix  twice a day for now.  Will get CT of the abdomen.   Advance to full liquid diet.   Pancytopenia - Possibly from cancer and chemotherapy.  No signs of bleeding.  Monitor.   Metastatic small bowel carcinoma currently on chemotherapy with cycle 1 of 04/09/2024 History of metastatic prostate cancer and renal cell carcinoma status post right nephrectomy - Patient sent for admission by Dr. Cloretta from cancer center.  Follow oncology recommendations.   Diabetes mellitus type 2 with hyperglycemia - CBGs with SSI.  A1c in AM   Hypertension - Monitor blood pressure.  Blood pressure intermittently on the lower side.  Hold antihypertensives for now   Hyperlipidemia -Hold statin for now because of vomiting   History of PE - Eliquis  on hold for now.  Might have to resume if no abnormality on CT of the abdomen.  Physical deconditioning - PT eval   Goals of care - Overall prognosis is guarded to poor.  Consult palliative care for goals of care discussion.     DVT prophylaxis: Heparin  subcutaneous Code Status: Full Family Communication: Sister at bedside  Disposition Plan: Status is: Inpatient Remains inpatient appropriate because: Of severity of illness    Consultants: Oncology/nephrology/palliative care  Procedures: None  Antimicrobials: None   Subjective: Patient seen and examined at bedside.  Feels slightly better.  Still complains of intermittent nausea.  No vomiting since admission.  No fever, shortness of breath, abdominal pain reported.  Objective: Vitals:   04/22/24 1639 04/22/24 2013 04/23/24 0516 04/23/24 0743  BP: (!) 119/58 131/67 (!) 109/55 (!) 131/58  Pulse: 79 83 69 81  Resp: 19 17 16 18   Temp: (!) 97.5 F (36.4 C) 97.7  F (36.5 C) 97.7 F (36.5 C) 98.4 F (36.9 C)  TempSrc: Oral  Oral Oral  SpO2: 100% 99% 96% 93%    Intake/Output Summary (Last 24 hours) at 04/23/2024 0802 Last data filed at 04/23/2024 0530 Gross per 24 hour  Intake 1387.2 ml  Output 575 ml  Net 812.2 ml   There were no vitals filed  for this visit.  Examination:  General exam: Appears calm and comfortable.  Looks chronically ill and deconditioned. Respiratory system: Bilateral decreased breath sounds at bases with scattered crackles Cardiovascular system: S1 & S2 heard, Rate controlled Gastrointestinal system: Abdomen is nondistended, soft and nontender. Normal bowel sounds heard. Extremities: No cyanosis, clubbing bilateral lower extremity pitting edema present  Central nervous system: Alert and oriented.  Slow to respond.  No focal neurological deficits. Moving extremities Skin: No rashes, lesions or ulcers Psychiatry: Mostly flat affect.  Currently not agitated.    Data Reviewed: I have personally reviewed following labs and imaging studies  CBC: Recent Labs  Lab 04/19/24 0800 04/22/24 1215  WBC 4.1 1.9*  NEUTROABS 3.3 1.1*  HGB 10.2* 8.0*  HCT 32.8* 26.1*  MCV 95.1 95.3  PLT 90* 82*   Basic Metabolic Panel: Recent Labs  Lab 04/22/24 1215  NA 154*  K 3.8  CL 120*  CO2 14*  GLUCOSE 233*  BUN 108*  CREATININE 6.27*  CALCIUM  10.2   GFR: Estimated Creatinine Clearance: 10.1 mL/min (A) (by C-G formula based on SCr of 6.27 mg/dL (H)). Liver Function Tests: Recent Labs  Lab 04/22/24 1215  AST 29  ALT 27  ALKPHOS 108  BILITOT 0.4  PROT 6.6  ALBUMIN 3.4*   No results for input(s): LIPASE, AMYLASE in the last 168 hours. No results for input(s): AMMONIA in the last 168 hours. Coagulation Profile: No results for input(s): INR, PROTIME in the last 168 hours. Cardiac Enzymes: No results for input(s): CKTOTAL, CKMB, CKMBINDEX, TROPONINI in the last 168 hours. BNP (last 3 results) No results for input(s): PROBNP in the last 8760 hours. HbA1C: No results for input(s): HGBA1C in the last 72 hours. CBG: Recent Labs  Lab 04/22/24 2025  GLUCAP 202*   Lipid Profile: No results for input(s): CHOL, HDL, LDLCALC, TRIG, CHOLHDL, LDLDIRECT in the last 72  hours. Thyroid  Function Tests: No results for input(s): TSH, T4TOTAL, FREET4, T3FREE, THYROIDAB in the last 72 hours. Anemia Panel: No results for input(s): VITAMINB12, FOLATE, FERRITIN, TIBC, IRON, RETICCTPCT in the last 72 hours. Sepsis Labs: No results for input(s): PROCALCITON, LATICACIDVEN in the last 168 hours.  No results found for this or any previous visit (from the past 240 hours).       Radiology Studies: US  RENAL Result Date: 04/22/2024 CLINICAL DATA:  Acute renal failure.  Status post right nephrectomy. EXAM: RENAL / URINARY TRACT ULTRASOUND COMPLETE COMPARISON:  CT 03/21/2024 FINDINGS: Right Kidney: Absent Left Kidney: Renal measurements: 10.3 x 5.8 x 5.0 cm = volume: 157 mL. Echogenicity within normal limits. No mass or hydronephrosis visualized. Bladder: Appears normal for degree of bladder distention. Left ureteral jet identified Other: Complex moderate perihepatic and perisplenic ascites is present with scalloping of the liver margin better seen on prior CT examination IMPRESSION: 1. Normal sonographic appearance of the LEFT kidney. 2. Status post RIGHT nephrectomy. 3. Complex moderate perihepatic and perisplenic ascites with scalloping of the liver margin better seen on prior CT examination. Electronically Signed   By: Dorethia Molt M.D.   On: 04/22/2024 22:35  Scheduled Meds:  Chlorhexidine  Gluconate Cloth  6 each Topical Daily   feeding supplement  1 Container Oral TID BM   heparin   5,000 Units Subcutaneous Q8H   insulin  aspart  0-5 Units Subcutaneous QHS   insulin  aspart  0-9 Units Subcutaneous TID WC   pantoprazole  (PROTONIX ) IV  40 mg Intravenous Q12H   sodium chloride  flush  10-40 mL Intracatheter Q12H   Continuous Infusions:  sodium bicarbonate  150 mEq in dextrose  5 % 1,150 mL infusion 125 mL/hr at 04/23/24 0240          Sophie Mao, MD [facility] 04/23/2024, 8:02 AM

## 2024-04-23 NOTE — Progress Notes (Signed)
 Physical Therapy Evaluation Patient Details Name: Brandon Peters MRN: [id number] DOB: June 08, 1956 Today's Date: 04/23/2024  History of Present Illness  Pt is a 68 y/o male admitted from cancer center secondary to nausea and vomitting. Work-up in progress. PMH including but not limited to CKD stage IV (baseline of around 2.5), metastatic prostate cancer, renal cell carcinoma status post right nephrectomy in October 2023, metastatic small bowel carcinoma currently on chemotherapy with cycle 1 of FOLFOX on 04/09/2024, anemia of chronic disease, PE on Eliquis , hypertension, hyperlipidemia, diabetes mellitus type 2, admission at [facility] in 01/2024 for AKI on CKD stage IV treated with IV fluids   Clinical Impression  Pt presented supine in bed with HOB elevated, awake and willing to participate in therapy session. Prior to admission, pt reported that he was previously independent with all functional mobility and ADLs. Pt lives alone but his sister is able to provide 24/7 supervision and had been staying with him since his new diagnosis of metastatic small bowel carcinoma currently on chemotherapy with cycle 1 of 04/09/2024. Pt lives in a single level home with four steps to enter. At the time of evaluation, pt demonstrating generalized weakness and limitations secondary to fatigue. He was able to complete bed mobility with min A, transfers with CGA-min A and ambulated a short distance within his room with use of RW with CGA. Pt would continue to benefit from skilled physical therapy services at this time while admitted and after d/c to address the below listed limitations in order to improve overall safety and independence with functional mobility.          If plan is discharge home, recommend the following: A little help with bathing/dressing/bathroom;A little help with walking and/or transfers;Assistance with cooking/housework;Help with stairs or ramp for entrance   Can travel by private vehicle         Equipment Recommendations None recommended by PT;Other (comment) (pt reporting he has all necessary DME at home)  Recommendations for Other Services       Functional Status Assessment Patient has had a recent decline in their functional status and demonstrates the ability to make significant improvements in function in a reasonable and predictable amount of time.     Precautions / Restrictions Precautions Precautions: Fall Recall of Precautions/Restrictions: Intact Restrictions Weight Bearing Restrictions Per Provider Order: No      Mobility  Bed Mobility Overal bed mobility: Needs Assistance Bed Mobility: Supine to Sit, Sit to Supine     Supine to sit: Min assist Sit to supine: Min assist   General bed mobility comments: increased time and effort, min A for bilateral LE management off of and back onto bed    Transfers Overall transfer level: Needs assistance Equipment used: Rolling walker (2 wheels) Transfers: Sit to/from Stand Sit to Stand: Min assist, Contact guard assist, From elevated surface           General transfer comment: pt completing sit<>stand from EOB x3 and from [facility] over toilet seat in bathroom; from elevated surface pt able to complete with CGA, from lower surface he requires min A    Ambulation/Gait Ambulation/Gait assistance: Contact guard assist Gait Distance (Feet): 20 Feet (10' x2) Assistive device: Rolling walker (2 wheels) Gait Pattern/deviations: Step-through pattern, Decreased stride length Gait velocity: decreased     General Gait Details: pt with slow but overal steady gait with RW within his room, to the bathroom and back; limited secondary to fatigue and IV team entering  Stairs  Wheelchair Mobility     Tilt Bed    Modified Rankin (Stroke Patients Only)       Balance Overall balance assessment: Needs assistance Sitting-balance support: Feet supported Sitting balance-Leahy Scale: Fair     Standing  balance support: During functional activity, Single extremity supported, Bilateral upper extremity supported Standing balance-Leahy Scale: Poor                               Pertinent Vitals/Pain Pain Assessment Pain Assessment: Faces Faces Pain Scale: Hurts a little bit Pain Location: bilateral feet Pain Descriptors / Indicators: Sore Pain Intervention(s): Monitored during session, Repositioned    Home Living Family/patient expects to be discharged to:: Private residence Living Arrangements: Alone Available Help at Discharge: Family;Available 24 hours/day Type of Home: House Home Access: Stairs to enter Entrance Stairs-Rails: None Entrance Stairs-Number of Steps: 4   Home Layout: One level Home Equipment: Agricultural consultant (2 wheels);Cane - single point;BSC/3in1      Prior Function Prior Level of Function : Independent/Modified Independent;Driving             Mobility Comments: independent ADLs Comments: independent, drivin up until a week ago when all of this began     Extremity/Trunk Assessment   Upper Extremity Assessment Upper Extremity Assessment: Defer to OT evaluation;Generalized weakness    Lower Extremity Assessment Lower Extremity Assessment: Generalized weakness       Communication   Communication Communication: No apparent difficulties    Cognition Arousal: Alert Behavior During Therapy: Flat affect   PT - Cognitive impairments: No apparent impairments                         Following commands: Intact       Cueing Cueing Techniques: Verbal cues, Gestural cues     General Comments      Exercises     Assessment/Plan    PT Assessment Patient needs continued PT services  PT Problem List Decreased strength;Decreased range of motion;Decreased balance;Decreased activity tolerance;Decreased mobility;Decreased coordination;Decreased knowledge of use of DME;Decreased knowledge of precautions       PT Treatment  Interventions DME instruction;Gait training;Stair training;Functional mobility training;Therapeutic activities;Therapeutic exercise;Balance training;Neuromuscular re-education;Patient/family education    PT Goals (Current goals can be found in the Care Plan section)  Acute Rehab PT Goals Patient Stated Goal: to feel better PT Goal Formulation: With patient/family Time For Goal Achievement: 05/07/24 Potential to Achieve Goals: Good    Frequency Min 2X/week     Co-evaluation               AM-PAC PT 6 Clicks Mobility  Outcome Measure Help needed turning from your back to your side while in a flat bed without using bedrails?: A Little Help needed moving from lying on your back to sitting on the side of a flat bed without using bedrails?: A Little Help needed moving to and from a bed to a chair (including a wheelchair)?: A Little Help needed standing up from a chair using your arms (e.g., wheelchair or bedside chair)?: A Little Help needed to walk in hospital room?: A Little Help needed climbing 3-5 steps with a railing? : A Lot 6 Click Score: 17    End of Session   Activity Tolerance: Patient limited by fatigue Patient left: in bed;with call bell/phone within reach;with bed alarm set;with family/visitor present Nurse Communication: Mobility status PT Visit Diagnosis: Other abnormalities of gait and  mobility (R26.89)    Time: 0832-0900 PT Time Calculation (min) (ACUTE ONLY): 28 min   Charges:   PT Evaluation $PT Eval Moderate Complexity: 1 Mod PT Treatments $Gait Training: 8-22 mins PT General Charges $$ ACUTE PT VISIT: 1 Visit         Delon DELENA KLEIN, DPT  Acute Rehabilitation Services Office [phone]   Delon HERO Sajid Ruppert 04/23/2024, 10:18 AM

## 2024-04-23 NOTE — Inpatient Diabetes Management (Signed)
 Inpatient Diabetes Program Recommendations  AACE/ADA: New Consensus Statement on Inpatient Glycemic Control (2015)  Target Ranges:  Prepandial:   less than 140 mg/dL      Peak postprandial:   less than 180 mg/dL (1-2 hours)      Critically ill patients:  140 - 180 mg/dL   Lab Results  Component Value Date   GLUCAP 240 (H) 04/23/2024   HGBA1C 7.2 (H) 04/23/2024    Review of Glycemic Control  Latest Reference Range & Units 04/22/24 20:25 04/23/24 11:24  Glucose-Capillary 70 - 99 mg/dL 797 (H) 759 (H)   Diabetes history: DM 2 Outpatient Diabetes medications: Tradjenta 5 mg Daily Current orders for Inpatient glycemic control:  Novolog  0-9 units tid + hs  Boost Breeze tid between meals  Inpatient Diabetes Program Recommendations:    -   consider Novolog  2 units tid meal/supplement coverage.  Thanks,  Clotilda Bull RN, MSN, BC-ADM Inpatient Diabetes Coordinator Team Pager [phone] (8a-5p)

## 2024-04-23 NOTE — Progress Notes (Signed)
 Brandon Peters is an 68 y.o. 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 Pt has CKD stage IV with baseline creatinine level around 2.4 and follows with a nephrologist at [facility].  He was admitted to [facility] 01/2024 for AKI on CKD when he was treated with IV fluid.  He was a started on chemotherapy FOLFOX on 04/09/2024 causing nausea, vomiting and unable to take food down.  He had lab done at the cancer center today which showed sodium of 154, potassium 3.8, CO2 14, BUN 108, creatinine 6.27, serum albumin 3.4, WBC 1.9, hemoglobin 8, platelet 82, started on D5 sodium bicarbonate  fluid, is not feeling well since starting chemotherapy.    Assessment/Plan: # Acute kidney injury on CKD stage IV in solitary kidney likely due to intravascular volume depletion/reduced renal perfusion caused by recent nausea vomiting, decreased oral intake due to chemotherapy.  Patient has only solitary kidney with poor renal reserve and history of obstructive uropathy. Kidney ultrasound LK no e/o obstruction.  -UA suspicious for a UTI, urine sodium 33 and bladder scan showed 398 -> repeat with bladder scan for PVR (no foley currently).  -Stop the sodium bicarbonate  fluid, HCO3 25 + peripheral edema, likely third spacing. -Strict ins and outs and daily lab. -Hopefully the kidney function will improve but certainly possible the patient is in ATN; currently no absolute indications to start renal replacement therapy.   We may need oncology or palliative care consult to address goals of care given metastatic malignancy and possibly of needing dialysis soon.   # Hyponatremia with free water deficit: Currently on sodium bicarbonate  fluid with dextrose  -> stop (actually hyponatremic).   I encouraged him to  increase free water intake.  Monitor lab.   # Anion gap metabolic acidosis: initially on hco3 -> off now   # UTI, many bacteria with 21-50 WBC's  # Pancytopenia due to recent chemotherapy, per primary and oncology team.  Subjective: Feeling better, no nausea anymore.  Denies any shortness of breath or chest pain.   Chemistry and CBC: Creatinine  Date/Time Value Ref Range Status  04/22/2024 12:15 PM 6.27 (H) 0.61 - 1.24 mg/dL Final  92/84/7974 88:64 AM 2.53 (H) 0.61 - 1.24 mg/dL Final  92/96/7974 98:57 PM 2.12 (H) 0.61 - 1.24 mg/dL Final  93/82/7974 89:78 AM 2.00 (H) 0.61 - 1.24 mg/dL Final  94/70/7974 88:76 AM 2.63 (H) 0.61 - 1.24 mg/dL Final  94/86/7974 88:93 AM 3.55 (H) 0.61 - 1.24 mg/dL Final  96/86/7974 91:87 AM 2.46 (H) 0.61 - 1.24 mg/dL Final  98/79/7974 89:92 AM 2.52 (H) 0.61 - 1.24 mg/dL Final  87/83/7975 89:85 AM 2.50 (H) 0.61 - 1.24 mg/dL Final  88/86/7975 89:92 AM 2.77 (H) 0.61 - 1.24 mg/dL Final  89/90/7975 98:94 PM 2.43 (H) 0.61 - 1.24 mg/dL Final  90/89/7975 90:66 AM 2.51 (H) 0.61 - 1.24 mg/dL Final  92/90/7975 90:70 AM 2.35 (H) 0.61 - 1.24 mg/dL Final  94/69/7975 91:47 AM 2.38 (H) 0.61 - 1.24 mg/dL Final  95/80/7975 89:97 AM 2.05 (H) 0.61 - 1.24 mg/dL Final  96/84/7975 89:86 AM 2.09 (H) 0.61 - 1.24 mg/dL Final  97/90/7975 89:94 AM 2.20 (H) 0.61 - 1.24 mg/dL Final  98/94/7975 91:57 AM 1.90 (H) 0.61 - 1.24 mg/dL Final  87/77/7976 92:41 AM 2.38 (H) 0.61 - 1.24 mg/dL  Final  09/02/2022 10:18 AM 3.98 (HH) 0.61 - 1.24 mg/dL Final    Comment:    CRITICAL RESULT CALLED TO, READ BACK BY AND VERIFIED WITH: RBV CT KRISTY HELLAR @1056  09/02/22 VW   09/01/2022 12:56 PM 4.25 (HH) 0.61 - 1.24 mg/dL Final    Comment:    CRITICAL RESULT CALLED TO, READ BACK BY AND VERIFIED WITHBETHA GEOFM SALT, RN [address] [facility] 09/01/22    08/04/2022 09:03 AM 2.56 (H) 0.61 - 1.24 mg/dL Final  90/91/7976 91:44 AM 1.60 (H) 0.61 - 1.24 mg/dL Final  91/81/7976 92:44 AM 1.58 (H) 0.61 - 1.24 mg/dL Final   92/71/7976 90:89 AM 1.64 (H) 0.61 - 1.24 mg/dL Final  92/92/7976 91:43 AM 1.98 (H) 0.61 - 1.24 mg/dL Final  93/83/7976 91:46 AM 1.76 (H) 0.61 - 1.24 mg/dL Final  94/73/7976 91:74 AM 1.62 (H) 0.61 - 1.24 mg/dL Final  94/80/7976 91:97 AM 1.75 (H) 0.61 - 1.24 mg/dL Final  94/95/7976 88:57 AM 1.70 (H) 0.61 - 1.24 mg/dL Final  95/71/7976 90:57 AM 1.61 (H) 0.61 - 1.24 mg/dL Final  95/78/7976 90:50 AM 3.13 (HH) 0.61 - 1.24 mg/dL Final    Comment:    CRITICAL RESULT CALLED TO, READ BACK BY AND VERIFIED WITH: S COWARD,RN 1048 01/14/2022 DBRADLEY   12/24/2021 10:03 AM 1.72 (H) 0.61 - 1.24 mg/dL Final  96/89/7976 91:62 AM 1.57 (H) 0.61 - 1.24 mg/dL Final  97/82/7976 91:71 AM 1.60 (H) 0.61 - 1.24 mg/dL Final  97/89/7976 98:65 PM 1.55 (H) 0.61 - 1.24 mg/dL Final  98/72/7976 89:64 AM 2.37 (H) 0.61 - 1.24 mg/dL Final  98/80/7976 90:66 AM 1.73 (H) 0.61 - 1.24 mg/dL Final  98/94/7976 90:61 AM 1.83 (H) 0.61 - 1.24 mg/dL Final  87/84/7977 87:45 PM 1.74 (H) 0.61 - 1.24 mg/dL Final  87/98/7977 98:88 PM 1.72 (H) 0.61 - 1.24 mg/dL Final  88/77/7977 89:85 AM 1.73 (H) 0.61 - 1.24 mg/dL Final  88/89/7977 87:40 PM 1.62 (H) 0.61 - 1.24 mg/dL Final  89/75/7977 98:60 PM 1.46 (H) 0.61 - 1.24 mg/dL Final  89/89/7977 89:95 AM 1.92 (H) 0.61 - 1.24 mg/dL Final  93/89/7977 89:71 AM 1.20 0.61 - 1.24 mg/dL Final  97/88/7977 90:48 AM 1.13 0.61 - 1.24 mg/dL Final  89/88/7978 90:46 AM 1.02 0.61 - 1.24 mg/dL Final  93/89/7978 91:66 AM 1.02 0.61 - 1.24 mg/dL Final  97/89/7978 90:57 AM 0.96 0.61 - 1.24 mg/dL Final  89/69/7979 91:53 AM 0.92 0.61 - 1.24 mg/dL Final  91/92/7979 91:58 AM 0.84 0.61 - 1.24 mg/dL Final  88/86/7981 89:44 AM 0.8 0.7 - 1.3 mg/dL Final  89/98/7981 89:94 AM 0.8 0.7 - 1.3 mg/dL Final  91/78/7981 90:62 AM 0.8 0.7 - 1.3 mg/dL Final  92/89/7981 90:82 AM 0.8 0.7 - 1.3 mg/dL Final  94/70/7981 88:47 AM 0.8 0.7 - 1.3 mg/dL Final  95/96/7981 88:73 AM 0.7 0.7 - 1.3 mg/dL Final  97/93/7981 88:86 AM 0.8 0.7 -  1.3 mg/dL Final  87/94/7982 91:40 AM 0.8 0.7 - 1.3 mg/dL Final  88/92/7982 88:80 AM 0.8 0.7 - 1.3 mg/dL Final  89/89/7982 89:83 AM 0.8 0.7 - 1.3 mg/dL Final  90/73/7982 88:99 AM 0.7 0.7 - 1.3 mg/dL Final  90/87/7982 90:45 AM 0.8 0.7 - 1.3 mg/dL Final  91/70/7982 88:81 AM 0.7 0.7 - 1.3 mg/dL Final   Creat  Date/Time Value Ref Range Status  06/29/2011 08:52 AM 0.95 0.50 - 1.35 mg/dL Final    Comment:     Please note change in reference range(s).  Creatinine, Ser  Date/Time Value Ref Range Status  04/23/2024 09:15 AM 5.72 (H) 0.61 - 1.24 mg/dL Final  92/70/7974 92:42 AM 5.80 (H) 0.61 - 1.24 mg/dL Final  87/88/7976 89:52 AM 3.98 (H) 0.61 - 1.24 mg/dL Final  95/76/7976 93:97 AM 2.35 (H) 0.61 - 1.24 mg/dL Final  95/77/7976 94:56 AM 2.43 (H) 0.61 - 1.24 mg/dL Final   Recent Labs  Lab 04/22/24 1215 04/23/24 0757 04/23/24 0915  NA 154* 150* 151*  K 3.8 2.9* 2.8*  CL 120* 116* 115*  CO2 14* 24 25  GLUCOSE 233* 211* 246*  BUN 108* 110* 106*  CREATININE 6.27* 5.80* 5.72*  CALCIUM  10.2 8.6* 8.5*   Recent Labs  Lab 04/19/24 0800 04/22/24 1215 04/23/24 0915  WBC 4.1 1.9* 1.0*  NEUTROABS 3.3 1.1* 0.3*  HGB 10.2* 8.0* 6.5*  HCT 32.8* 26.1* 20.6*  MCV 95.1 95.3 93.6  PLT 90* 82* 74*   Liver Function Tests: Recent Labs  Lab 04/22/24 1215 04/23/24 0757  AST 29 24  ALT 27 19  ALKPHOS 108 64  BILITOT 0.4 0.5  PROT 6.6 5.1*  ALBUMIN 3.4* 2.1*   No results for input(s): LIPASE, AMYLASE in the last 168 hours. No results for input(s): AMMONIA in the last 168 hours. Cardiac Enzymes: No results for input(s): CKTOTAL, CKMB, CKMBINDEX, TROPONINI in the last 168 hours. Iron Studies: No results for input(s): IRON, TIBC, TRANSFERRIN, FERRITIN in the last 72 hours. PT/INR: @LABRCNTIP (inr:5)  Xrays/Other Studies: ) Results for orders placed or performed during the hospital encounter of 04/22/24 (from the past 48 hours)  Glucose, capillary     Status:  Abnormal   Collection Time: 04/22/24  8:25 PM  Result Value Ref Range   Glucose-Capillary 202 (H) 70 - 99 mg/dL    Comment: Glucose reference range applies only to samples taken after fasting for at least 8 hours.  Urinalysis, Routine w reflex microscopic -Urine, Clean Catch     Status: Abnormal   Collection Time: 04/23/24  1:49 AM  Result Value Ref Range   Color, Urine YELLOW YELLOW   APPearance CLOUDY (A) CLEAR   Specific Gravity, Urine 1.011 1.005 - 1.030   pH 5.0 5.0 - 8.0   Glucose, UA NEGATIVE NEGATIVE mg/dL   Hgb urine dipstick SMALL (A) NEGATIVE   Bilirubin Urine NEGATIVE NEGATIVE   Ketones, ur NEGATIVE NEGATIVE mg/dL   Protein, ur NEGATIVE NEGATIVE mg/dL   Nitrite NEGATIVE NEGATIVE   Leukocytes,Ua SMALL (A) NEGATIVE   RBC / HPF 0-5 0 - 5 RBC/hpf   WBC, UA 21-50 0 - 5 WBC/hpf   Bacteria, UA MANY (A) NONE SEEN   Squamous Epithelial / HPF 0-5 0 - 5 /HPF   Amorphous Crystal PRESENT     Comment: Performed at [facility], [address], [place], [place] [address]  Sodium, urine, random     Status: None   Collection Time: 04/23/24  1:49 AM  Result Value Ref Range   Sodium, Ur 33 mmol/L    Comment: Performed at [facility], [address] [place], [place] [address]  Osmolality, urine     Status: None   Collection Time: 04/23/24  1:49 AM  Result Value Ref Range   Osmolality, Ur 408 300 - 900 mOsm/kg    Comment: Performed at [facility], [address], [place], [place] [address]  Comprehensive metabolic panel     Status: Abnormal   Collection Time: 04/23/24  7:57 AM  Result Value Ref  Range   Sodium 150 (H) 135 - 145 mmol/L   Potassium 2.9 (L) 3.5 - 5.1 mmol/L   Chloride 116 (H) 98 - 111 mmol/L   CO2 24 22 - 32 mmol/L   Glucose, Bld 211 (H) 70 - 99 mg/dL    Comment: Glucose reference range applies only to samples taken after fasting for at least 8 hours.   BUN 110 (H) 8 - 23 mg/dL   Creatinine, Ser 4.19 (H) 0.61 - 1.24 mg/dL   Calcium  8.6 (L) 8.9 -  10.3 mg/dL   Total Protein 5.1 (L) 6.5 - 8.1 g/dL   Albumin 2.1 (L) 3.5 - 5.0 g/dL   AST 24 15 - 41 U/L   ALT 19 0 - 44 U/L   Alkaline Phosphatase 64 38 - 126 U/L   Total Bilirubin 0.5 0.0 - 1.2 mg/dL   GFR, Estimated 10 (L) >60 mL/min    Comment: (NOTE) Calculated using the CKD-EPI Creatinine Equation (2021)    Anion gap 10 5 - 15    Comment: Performed at [facility], [address] [place], [place] [address]  Magnesium     Status: None   Collection Time: 04/23/24  7:57 AM  Result Value Ref Range   Magnesium 2.4 1.7 - 2.4 mg/dL    Comment: Performed at [facility], [address] [place], [place] [address]  Hemoglobin A1c     Status: Abnormal   Collection Time: 04/23/24  7:58 AM  Result Value Ref Range   Hgb A1c MFr Bld 7.2 (H) 4.8 - 5.6 %    Comment: (NOTE) Diagnosis of Diabetes The following HbA1c ranges recommended by the American Diabetes Association (ADA) may be used as an aid in the diagnosis of diabetes mellitus.  Hemoglobin             Suggested A1C NGSP%              Diagnosis  <5.7                   Non Diabetic  5.7-6.4                Pre-Diabetic  >6.4                   Diabetic  <7.0                   Glycemic control for                       adults with diabetes.     Mean Plasma Glucose 159.94 mg/dL    Comment: Performed at [facility], [address] [place], [place] [address]  CBC with Differential/Platelet     Status: Abnormal   Collection Time: 04/23/24  9:15 AM  Result Value Ref Range   WBC 1.0 (LL) 4.0 - 10.5 K/uL    Comment: REPEATED TO VERIFY WHITE COUNT CONFIRMED ON SMEAR THIS CRITICAL RESULT HAS VERIFIED AND BEEN CALLED TO AMBER LEONARD RN. BY TAMEECO CALDWELL ON 07 29 2025 AT 1025, AND HAS BEEN READ BACK.     RBC 2.20 (L) 4.22 - 5.81 MIL/uL   Hemoglobin 6.5 (LL) 13.0 - 17.0 g/dL    Comment: REPEATED TO VERIFY THIS CRITICAL RESULT HAS VERIFIED AND BEEN CALLED TO AMBER LEONARD RN. BY TAMEECO CALDWELL ON 07 29 2025  AT 1025, AND HAS BEEN READ BACK.     HCT 20.6 (  L) 39.0 - 52.0 %   MCV 93.6 80.0 - 100.0 fL   MCH 29.5 26.0 - 34.0 pg   MCHC 31.6 30.0 - 36.0 g/dL   RDW 84.7 88.4 - 84.4 %   Platelets 74 (L) 150 - 400 K/uL    Comment: Immature Platelet Fraction may be clinically indicated, consider ordering this additional test [id number] REPEATED TO VERIFY    nRBC 0.0 0.0 - 0.2 %   Neutrophils Relative % 33 %   Neutro Abs 0.3 (LL) 1.7 - 7.7 K/uL    Comment: REPEATED TO VERIFY THIS CRITICAL RESULT HAS VERIFIED AND BEEN CALLED TO AMBER LEONARD RN. BY TAMEECO CALDWELL ON 07 29 2025 AT 1059, AND HAS BEEN READ BACK.     Lymphocytes Relative 49 %   Lymphs Abs 0.5 (L) 0.7 - 4.0 K/uL   Monocytes Relative 15 %   Monocytes Absolute 0.1 0.1 - 1.0 K/uL   Eosinophils Relative 2 %   Eosinophils Absolute 0.0 0.0 - 0.5 K/uL   Basophils Relative 0 %   Basophils Absolute 0.0 0.0 - 0.1 K/uL   WBC Morphology MORPHOLOGY UNREMARKABLE    RBC Morphology See Note    Smear Review Normal platelet morphology    Immature Granulocytes 1 %   Abs Immature Granulocytes 0.01 0.00 - 0.07 K/uL   Tear Drop Cells PRESENT     Comment: Performed at [facility], [address], [place], [place] [address]  Basic metabolic panel with GFR     Status: Abnormal   Collection Time: 04/23/24  9:15 AM  Result Value Ref Range   Sodium 151 (H) 135 - 145 mmol/L   Potassium 2.8 (L) 3.5 - 5.1 mmol/L   Chloride 115 (H) 98 - 111 mmol/L   CO2 25 22 - 32 mmol/L   Glucose, Bld 246 (H) 70 - 99 mg/dL    Comment: Glucose reference range applies only to samples taken after fasting for at least 8 hours.   BUN 106 (H) 8 - 23 mg/dL   Creatinine, Ser 4.27 (H) 0.61 - 1.24 mg/dL   Calcium  8.5 (L) 8.9 - 10.3 mg/dL   GFR, Estimated 10 (L) >60 mL/min    Comment: (NOTE) Calculated using the CKD-EPI Creatinine Equation (2021)    Anion gap 11 5 - 15    Comment: Performed at [facility], [address] [place], [place] [address]  Glucose,  capillary     Status: Abnormal   Collection Time: 04/23/24 11:24 AM  Result Value Ref Range   Glucose-Capillary 240 (H) 70 - 99 mg/dL    Comment: Glucose reference range applies only to samples taken after fasting for at least 8 hours.  Prepare RBC (crossmatch)     Status: None   Collection Time: 04/23/24 11:30 AM  Result Value Ref Range   Order Confirmation      ORDER PROCESSED BY BLOOD BANK Performed at [facility], [address], [place], [place] [address]   Type and screen [facility]     Status: None (Preliminary result)   Collection Time: 04/23/24 11:30 AM  Result Value Ref Range   ABO/RH(D) PENDING    Antibody Screen PENDING    Sample Expiration      04/26/2024,2359 Performed at [facility], [address], [place], [place] [address]    US  RENAL Result Date: 04/22/2024 CLINICAL DATA:  Acute renal failure.  Status post right nephrectomy. EXAM: RENAL / URINARY TRACT ULTRASOUND  COMPLETE COMPARISON:  CT 03/21/2024 FINDINGS: Right Kidney: Absent Left Kidney: Renal measurements: 10.3 x 5.8 x 5.0 cm = volume: 157 mL. Echogenicity within normal limits. No mass or hydronephrosis visualized. Bladder: Appears normal for degree of bladder distention. Left ureteral jet identified Other: Complex moderate perihepatic and perisplenic ascites is present with scalloping of the liver margin better seen on prior CT examination IMPRESSION: 1. Normal sonographic appearance of the LEFT kidney. 2. Status post RIGHT nephrectomy. 3. Complex moderate perihepatic and perisplenic ascites with scalloping of the liver margin better seen on prior CT examination. Electronically Signed   By: Dorethia Molt M.D.   On: 04/22/2024 22:35    PMH:   Past Medical History:  Diagnosis Date   ARF (acute renal failure) (HCC)    d/t lisinopril and/or diazide- was on both in a 2 week period- cr up to 4.99   Diabetes mellitus    Family history of lung cancer    Hyperlipidemia    Hypertension     Personal history of other malignant neoplasm of small intestine    RENAL CALCULUS, HX OF 06/26/2007    PSH:   Past Surgical History:  Procedure Laterality Date   COLONOSCOPY N/A 03/04/2016   Procedure: COLONOSCOPY;  Surgeon: Belvie Just, MD;  Location: [facility] ENDOSCOPY;  Service: Endoscopy;  Laterality: N/A;   ENTEROSCOPY N/A 03/04/2016   Procedure: ENTEROSCOPY;  Surgeon: Belvie Just, MD;  Location: [facility] ENDOSCOPY;  Service: Endoscopy;  Laterality: N/A;   IR IMAGING GUIDED PORT INSERTION  04/08/2024    Allergies:  Allergies  Allergen Reactions   Penicillins Other (See Comments)    Pt states that he cannot really remember reaction. Tentatively reports swelling/hives    Medications:   Prior to Admission medications   Medication Sig Start Date End Date Taking? Authorizing Provider  abiraterone  acetate (ZYTIGA ) 250 MG tablet TAKE 4 TABLETS BY MOUTH ONCE DAILY AS DIRECTED.  TAKE 1 HOUR BEFORE OR 2 HOURS AFTER A MEAL 11/23/23  Yes Cloretta Arley NOVAK, MD  amLODipine  (NORVASC ) 10 MG tablet Take 10 mg by mouth daily. 04/21/16  Yes [provider]  apixaban  (ELIQUIS ) 2.5 MG TABS tablet Take 1 tablet by mouth twice daily 04/04/24  Yes Cloretta Arley NOVAK, MD  docusate sodium  (COLACE) 100 MG capsule Take 100 mg by mouth daily as needed for mild constipation.   Yes [provider]  ferrous sulfate  325 (65 FE) MG EC tablet Take 1 tablet (325 mg total) by mouth 3 (three) times daily with meals. 11/06/19  Yes Cloretta Arley NOVAK, MD  lidocaine -prilocaine  (EMLA ) cream Apply 1 Application topically as needed (Apply to port 1-2 hours before its use starting 2 weeks after it is placed). 04/01/24  Yes Cloretta Arley NOVAK, MD  linagliptin (TRADJENTA) 5 MG TABS tablet Take 1 tablet by mouth daily. 02/17/24 04/22/24 Yes [provider]  loperamide  (IMODIUM ) 2 MG capsule Take 2 mg by mouth daily as needed for diarrhea or loose stools.   Yes [provider]  ondansetron  (ZOFRAN ) 8 MG tablet Take 1  tablet (8 mg total) by mouth every 8 (eight) hours as needed (may use 3 days after chemo as needed for nausea). 04/01/24  Yes Cloretta Arley NOVAK, MD  predniSONE  (DELTASONE ) 5 MG tablet Take 1 tablet by mouth once daily with breakfast 03/11/24  Yes Cloretta Arley NOVAK, MD  prochlorperazine  (COMPAZINE ) 10 MG tablet Take 1 tablet (10 mg total) by mouth every 6 (six) hours as needed for nausea or vomiting. 04/01/24  Yes Cloretta Arley NOVAK, MD  rosuvastatin  (CRESTOR ) 10 MG tablet Take 10 mg by mouth every morning. 02/23/21  Yes [provider]  tamsulosin  (FLOMAX ) 0.4 MG CAPS capsule Take 0.4 mg by mouth at bedtime. 05/02/16  Yes [provider]    Discontinued Meds:   Medications Discontinued During This Encounter  Medication Reason   torsemide (DEMADEX) 10 MG tablet    empagliflozin (JARDIANCE) 10 MG TABS tablet Discontinued by provider    Social History:  reports that he has never smoked. He has never used smokeless tobacco. He reports that he does not drink alcohol and does not use drugs.  Family History:   Family History  Problem Relation Age of Onset   Cancer Mother    Diabetes Mother    Hypertension Mother    Coronary artery disease Mother    Hyperlipidemia Sister    Hypertension Sister     Blood pressure (!) 131/58, pulse 81, temperature 98.4 F (36.9 C), temperature source Oral, resp. rate 18, weight 66.5 kg, SpO2 93%. Physical Exam: General exam: Appears comfortable  Respiratory system: CTA b/l, normal rate Cardiovascular system: S1 & S2 heard, RRR.  Bilateral pedal edema+. Gastrointestinal system: Abdomen SNDNT+BS Central nervous system: Alert and oriented. No focal neurological deficits. Extremities: Bilateral lower extremities pitting edema present, no cyanosis. Skin: No rashes, lesions or ulcers Psychiatry: Judgement and insight appear normal. Mood & affect appropriate.      MELIA LYNWOOD ORN, MD 04/23/2024, 12:04 PM

## 2024-04-23 NOTE — Evaluation (Signed)
 Clinical/Bedside Swallow Evaluation Patient Details  Name: Mose Colaizzi MRN: [id number] Date of Birth: 06-Mar-1956  Today's Date: 04/23/2024 Time: SLP Start Time (ACUTE ONLY): 9041 SLP Stop Time (ACUTE ONLY): 1023 SLP Time Calculation (min) (ACUTE ONLY): 25 min  Past Medical History:  Past Medical History:  Diagnosis Date   ARF (acute renal failure) (HCC)    d/t lisinopril and/or diazide- was on both in a 2 week period- cr up to 4.99   Diabetes mellitus    Family history of lung cancer    Hyperlipidemia    Hypertension    Personal history of other malignant neoplasm of small intestine    RENAL CALCULUS, HX OF 06/26/2007   Past Surgical History:  Past Surgical History:  Procedure Laterality Date   COLONOSCOPY N/A 03/04/2016   Procedure: COLONOSCOPY;  Surgeon: Belvie Just, MD;  Location: [facility] ENDOSCOPY;  Service: Endoscopy;  Laterality: N/A;   ENTEROSCOPY N/A 03/04/2016   Procedure: ENTEROSCOPY;  Surgeon: Belvie Just, MD;  Location: [facility] ENDOSCOPY;  Service: Endoscopy;  Laterality: N/A;   IR IMAGING GUIDED PORT INSERTION  04/08/2024   HPI:  Patient is a 68 y.o. 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 He presented to the hospital on 7/28 from cancer center secondary to nausea and vomiting. He was admitted with acute metabolic acidosis, AKI on CKD stage IV, pancytopenia, intractable nausea and vomiting, hypernatremia. He was started on a full liquids diet.    Assessment / Plan / Recommendation  Clinical Impression  Patient evaluated for swallow function at bedside which included interview/discussion with patient and spouse regarding his symptoms. They reported that he started to have difficulty swallowing on 7/19 following first chemotherapy treatment cycle on 7/15. Initially, he was able to swallow smal pills  but this progressed to him vomiting/regurgitating food and pills  and then not being able to swallow anything. The last time he was able to swallow a pill was on the 24th. He also endorses a dry mouth and throat. SLP assessed his swallow function via straw sips of thin liquids (water). Swallow initiation appeared timely and no overt s/s aspiration observed. Patient denies pain in throat and denied difficulty when swallowing liquids during this evaluation. Per MD note, plan is for CT of abdomen and potential need for GI consult. SLP will follow for now, however suspect patient's symptoms are primarily esophageal in nature. SLP Visit Diagnosis: Dysphagia, unspecified (R13.10)    Aspiration Risk  Mild aspiration risk    Diet Recommendation Other (Comment) (PO's as recommended by MD)    Liquid Administration via: Cup;Straw Medication Administration: Other (Comment) (as tolerated) Supervision: Full supervision/cueing for compensatory strategies;Staff to assist with self feeding Compensations: Slow rate;Small sips/bites Postural Changes: Seated upright at 90 degrees    Other  Recommendations Oral Care Recommendations: Oral care BID     Assistance Recommended at Discharge    Functional Status Assessment Patient has had a recent decline in their functional status and demonstrates the ability to make significant improvements in function in a reasonable and predictable amount of time.  Frequency and Duration min 2x/week  1 week       Prognosis Prognosis for improved oropharyngeal function: Good      Swallow Study   General Date of Onset: 04/22/24 HPI: Patient is a 68 y.o. 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 He presented to the hospital on 7/28 from cancer center secondary to nausea and vomiting. He was  admitted with acute metabolic acidosis, AKI on CKD stage IV, pancytopenia, intractable nausea and vomiting, hypernatremia. He was started on a full liquids diet. Type of Study: Bedside Swallow Evaluation Previous Swallow Assessment: none found Diet Prior to this Study: Thin liquids (Level 0);Full liquid diet Temperature Spikes Noted: No Respiratory Status: Room air History of Recent Intubation: No Behavior/Cognition: Alert;Cooperative;Pleasant mood Oral Cavity Assessment: Dry Oral Care Completed by SLP: No Oral Cavity - Dentition: Edentulous Self-Feeding Abilities: Total assist Patient Positioning: Upright in bed Baseline Vocal Quality: Low vocal intensity Volitional Cough: Weak Volitional Swallow: Able to elicit    Oral/Motor/Sensory Function Overall Oral Motor/Sensory Function: Within functional limits   Ice Chips     Thin Liquid Thin Liquid: Within functional limits Presentation: Straw    Nectar Thick     Honey Thick     Puree Puree: Not tested   Solid     Solid: Not tested      Norleen IVAR Blase, MA, CCC-SLP Speech Therapy

## 2024-04-23 NOTE — Progress Notes (Signed)
 IP PROGRESS NOTE  Subjective:   [name] reports feeling better compared to on hospital admission yesterday.  No nausea this morning.  Objective: Vital signs in last 24 hours: Blood pressure (!) 131/58, pulse 81, temperature 98.4 F (36.9 C), temperature source Oral, resp. rate 18, weight 146 lb 9.7 oz (66.5 kg), SpO2 93%.  Intake/Output from previous day: 07/28 0701 - 07/29 0700 In: 1387.2 [I.V.:1387.2] Out: 575 [Urine:575]  Physical Exam:  HEENT: No thrush Lungs: Clear bilaterally Cardiac: Regular rate and rhythm Abdomen: Mildly distended, tender in the left subcostal region, no mass, no hepatosplenomegaly Extremities: Pitting edema to the left greater than right lower leg and foot   Portacath/PICC-without erythema  Lab Results: Recent Labs    04/22/24 1215 04/23/24 0915  WBC 1.9* 1.0*  HGB 8.0* 6.5*  HCT 26.1* 20.6*  PLT 82* 74*    BMET Recent Labs    04/23/24 0757 04/23/24 0915  NA 150* 151*  K 2.9* 2.8*  CL 116* 115*  CO2 24 25  GLUCOSE 211* 246*  BUN 110* 106*  CREATININE 5.80* 5.72*  CALCIUM  8.6* 8.5*    Lab Results  Component Value Date   CEA1 3.31 05/03/2019   CEA 4.56 04/09/2024    Studies/Results: US  RENAL Result Date: 04/22/2024 CLINICAL DATA:  Acute renal failure.  Status post right nephrectomy. EXAM: RENAL / URINARY TRACT ULTRASOUND COMPLETE COMPARISON:  CT 03/21/2024 FINDINGS: Right Kidney: Absent Left Kidney: Renal measurements: 10.3 x 5.8 x 5.0 cm = volume: 157 mL. Echogenicity within normal limits. No mass or hydronephrosis visualized. Bladder: Appears normal for degree of bladder distention. Left ureteral jet identified Other: Complex moderate perihepatic and perisplenic ascites is present with scalloping of the liver margin better seen on prior CT examination IMPRESSION: 1. Normal sonographic appearance of the LEFT kidney. 2. Status post RIGHT nephrectomy. 3. Complex moderate perihepatic and perisplenic ascites with scalloping of the  liver margin better seen on prior CT examination. Electronically Signed   By: Dorethia Molt M.D.   On: 04/22/2024 22:35    Medications: I have reviewed the patient's current medications.  Assessment/Plan: History of Severe anemia-status post a red cell transfusion 03/04/2016 2.. Prostate cancer Extensive sclerotic bone metastases noted on a CT of the abdomen/pelvis 03/04/2016 and MRI abdomen 03/05/2016 Markedly elevated PSA Lupron  initiated 03/15/2016; Casodex  14 days beginning 03/15/2016 Prostate biopsy 03/18/2016 05/09/2016 PSA significantly improved at 21 Abiraterone  initiated 05/10/2016 PSA less than 0.1 07/10/2018 PSA less than 0.1 08/30/2018 PSA less than 0.1 10/18/2018 PSA less than 0.1 11/29/2018 PSA less than 0.1 on 11/06/2019  PSA less than 0.1 on 03/05/2020 PSA less than 0.1 on 07/06/2020 PSA less than 0.1 08/17/2021 PSA less than 0.1 on 09/30/2021 PSA 0.1 on 11/12/2021 PSA 0.1 on 02/18/2022 PSA 0.2 on 12/09/2022 PSA 0.3 on 04/04/2023  PSA 0.4 on 06/06/2023 PSA 0.5 on 07/05/2023 PSA 0.7 on 08/09/2023 PSA 1.5 on 12/07/2023 PSA 4.7 on 02/06/2024 PSA 6.37 on 02/14/2024 PSA 5.0 on 02/22/2024 PSA 7.3 on 03/28/2024     3.   Symptoms of obstructive uropathy. Improved   4.   Distal duodenal and descending colon masses biopsy of the duodenal mass 03/04/2016 confirmed fragments of small bowel mucosa with focal high-grade dysplasia Biopsy of the descending colon polyps revealed fragments of a tubulovillous adenoma CT 08/29/2017-similar soft tissue thickening in the region of the splenic flexure of the colon suboptimally evaluated due to lack of colonic enteric contrast Referred to Dr. Rollin Referred to [facility] Resection of descending colon polyps  01/31/2018, tubulovillous adenomas with high-grade dysplasia Attempted endoscopic resection of the fourth segment duodenal polyp on 05/09/2018, firm unresectable lesion, biopsy revealed moderately differentiated adenocarcinoma CT abdomen/pelvis  05/25/2018- heterogeneously enhancing right lower pole renal lesion; diffuse sclerotic osseous metastatic lesions compatible with history of prostate cancer CT chest 05/25/2018- no pulmonary metastasis.  Diffuse osseous metastasis involving the axial and appendicular skeleton. 06/19/2018-sleeve resection of third and fourth portions of duodenum with primary end-to-end anastomosis, feeding gastrojejunostomy tube placement, Dr. Ames; poorly differentiated adenocarcinoma duodenum, 3 cm; tumor invades the muscularis propria; lymphovascular invasion present; negative margins; 4 of 11 involved lymph nodes; soft tissue deposits also seen in the mesentery; all mucosal margins and soft tissue margins negative but 2 of the positive nodes are found in the soft tissue margin submitted for frozen section (pT2 pN2); second mucosal lesion 1.2 cm from the proximal margin, tubular adenoma with focal severe dysplasia Intact expression of mismatch repair proteins by IHC; microsatellite stable Cycle 1 adjuvant Xeloda  07/23/2018 Cycle 2 adjuvant Xeloda  08/13/2018 Cycle 3 adjuvant Xeloda  09/10/2018 (dose reduced to 1000 mg twice daily for 14 days)  Cycle 4 adjuvant Xeloda  10/01/2018 (dose increased to 1500 mg every morning and 1000 mg every afternoon for 14 days) Cycle 5 adjuvant Xeloda  10/22/2018 Cycle 6 adjuvant Xeloda  11/12/2018 Cycle 7 adjuvant Xeloda  12/03/2018 Cycle 8 adjuvant Xeloda  12/24/2018   5.  Anorexia/weight loss- resolved   6.  Undescended testicles   7.  Right renal mass.  CT 08/29/2017-right renal mass involving the lower pole was partially calcified and does demonstrate enhancement following contrast most consistent with renal cell carcinoma.  Lesion not significantly enlarged compared with the prior CT. Followed by urology. Renal ultrasound 07/06/2021-enlargement of right renal mass CT abdomen 09/28/2020-large minute heterogenously enhancing mass in the inferior pole the right kidney with new fullness of the  inferior branch of the right renal vein, no enlarged abdominal lymph nodes, unchanged sclerotic osseous metastatic disease MRI abdomen 08/12/2021-heterogenously enhancing mass in the lower pole right kidney extending into the central sinus fat and posteriorly has mass-effect on the right psoas muscle without definite invasion, tumor thrombus in the right renal vein extending to the IVC/RA junction, upper normal size retrocaval node 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, nuclear grade 3 Axitinib /pembrolizumab  10/22/2021 Axitinib  placed on hold 11/05/2021 due to hand-foot syndrome Axitinib  resumed at a dose of 5 mg daily 11/12/2021, pembrolizumab  11/12/2021 Axitinib  discontinued 12/03/2021 secondary to progressive hand-foot syndrome, pembrolizumab  12/03/2021 Axitinib  resumed at a reduced dose of 2 mg twice daily after office visit 12/14/2021 Pembrolizumab  12/24/2021 Axitinib  stopped 01/03/2022 secondary to persistent hand/foot symptoms CT chest at [facility] 01/13/2022-no evidence of thoracic metastatic disease, similar diffuse sclerotic change in the skeleton MRI abdomen at [facility] 01/13/2022-similar size of right lower pole renal mass with renal vein thrombosis, decreased overall extent of thrombus now extending to the intrahepatic IVC, enhancement of right renal mass and thrombus is decreased, new left hydroureter ureter nephrosis with asymmetric perinephric stranding Pembrolizumab  01/27/2022 Axitinib  resumed 2 mg daily 01/28/2022 CT abdomen/pelvis at [facility] 02/10/2022-overall reduced size of right lower pole renal mass which demonstrates persistent region of enhancement along the posterior and lateral margin with extension into the renal sinus.  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  Query additional IVC thrombus at the intrahepatic IVC and extending into the right atrium. Axitinib  continued 2 mg daily, Pembrolizumab  every 3 weeks CT 05/05/2022 at [facility]-partially  visualized pulmonary embolism involving segmental arteries of the right lower  lobe.  Interval slight decrease in size of the mass arising from the lower pole the right kidney.  Mass demonstrates persistent moderate necrosis centrally.  Increased enhancing soft tissue component measuring up to 2 cm which is mildly increased in size compared to prior from 02/10/2022.  Bland tumor thrombus identified in the IVC at the level of the right renal vein ostium extending into the suprarenal IVC superiorly.  No obvious thrombus identified in the infrarenal, intrahepatic and suprahepatic IVC.  Likely trace bland thrombus in the anterior right renal vein.  Stable several subcentimeter retroperitoneal lymph nodes.  Diffuse osseous sclerotic and lytic metastatic disease stable compared to prior. Axitinib  placed on hold 05/04/2022 due to pain bilateral feet; 05/13/2022 he will continue to hold axitinib  Pembrolizumab  05/13/2022 Pembrolizumab  06/03/2022 Robotic right radical nephrectomy and IVC thrombectomy 06/27/2022-right kidney with tumor necrosis/hemorrhage,ypT0, adherent thrombus with cellular necrosis and no viable carcinoma present at the renal vein margin, other margins negative for carcinoma, 0/3 lymph nodes.  6.5 cm tumor including intraparenchymal lesion and thrombus in the renal vein 8.  Diabetes   9.  Anemia-stool Hemoccults + March 2021, referred to Dr. Rollin for endoscopic evaluation, anemia also secondary to metastatic prostate cancer, hypogonadism Small bowel enteroscopy 02/11/2020-esophagus normal.  Hematin found in the gastric body.  Examined duodenum normal.  No evidence of significant pathology in the entire examined portion of the jejunum.  There was no overt source of bleeding or inflammation.  The bleeding may be from small AVMs.  Surgical anastomosis was not visible with repeated inspection of the duodenum. Colonoscopy 02/11/2020-multiple sessile and semipedunculated polyps were found in the rectum, descending  colon and ascending colon, 3 to 18 mm in size.  Polyps removed (polypectomy descending colon-fragments of tubular adenoma, inflammatory type polyp; polypectomy ascending colon-tubular adenoma, no high-grade dysplasia or malignancy; polypectomy descending colon-fragments of tubulovillous adenoma, no high-grade dysplasia or malignancy; polypectomy colon, rectum-tubular adenoma, no high-grade dysplasia or malignancy). Colonoscopy 02/02/2021-seven 2 to 10 mm polyps in the sigmoid colon (tubular adenoma), descending colon (fragments of tubulovillous adenoma, fragments of tubular adenoma ) and transverse colon (fragments of tubular adenoma). Progressive anemia 07/05/2021; stool cards positive Upper endoscopy/colonoscopy 09/23/2021-multiple polyps removed from the colon-tubular adenomas, diffuse angioectasias in the stomach with bleeding-not amenable to endoscopic treatment 10.  Renal failure-progressive 01/17/2022, progressive following right nephrectomy October 2023 11.  CT chest at [facility] 01/13/2022-no evidence of thoracic metastatic disease, similar diffuse sclerotic changes in the skeleton 12.  CT renal stone study 01/14/2022-mild left side hydroureteronephrosis secondary to a left UVJ stone, left nephrolithiasis, new right middle lobe collapse/consolidation, edema/fluid at the left iliopsoas muscle 13.  PE noted on CT 05/05/2022-on apixaban .  He saw Dr. Darcy at [facility] 05/16/2022; instructions are to hold apixaban  72 hours prior to scheduled surgery and start Lovenox at prophylactic dosing. 14.  Metastatic small bowel carcinoma  CTs at [facility] 02/13/2024-new periaortic lymphadenopathy and peritoneal and omental carcinomatosis, small amount of new ascites, similar-appearing diffuse bony metastatic disease, focal thickening of the gastric body Omental biopsy 03/07/2024-metastatic moderately differentiated adenocarcinoma compatible with GI/intestinal primary; foundation 1 microsatellite stable, tumor mutation burden 1, ERBB2  amplification equivocal, NRAS Q61H; HER2 positive FISH CT 03/21/2024-similar abdominal retroperitoneal nodal metastasis.  1 node measuring minimally larger and the other minimally smaller.  Similar omental/peritoneal metastasis with mild increase in small volume abdominopelvic ascites.  Similar diffuse osseous metastasis.  New tiny left pleural effusion. Cycle 1 FOLFOX 04/09/2024  15.  Admission 04/22/2024 with nausea/vomiting and progressive renal failure  Mr Georg  is now at day 15 following cycle 1 FOLFOX given for treatment of metastatic small bowel carcinoma.  He was admitted yesterday with intractable nausea/vomiting and progressive renal failure.  The and vomiting began approximately 1 week following chemotherapy.  I think it is unlikely GI symptoms are related to chemotherapy.  I remain concerned of a GI process such as peptic ulcer disease or obstruction related to tumor.  He has severe pancytopenia.  The neutropenia/thrombocytopenia are secondary to chemotherapy.  The anemia is secondary to chronic renal failure, metastatic prostate cancer involving the bone marrow, and chemotherapy.  I discussed goals of care with Mr. Pavon yesterday.  He wants to continue treatment of the cancer and would like to remain on a full CODE STATUS.  He is considering whether to undergo hemodialysis if this is due to/recommended by the nephrology service.  Recommendations: Transfuse packed red blood cells Daily CBC with differential Blood cultures and broad-spectrum intravenous antibiotics for a fever Continue management of renal failure per nephrology Agree with CT abdomen/pelvis     LOS: 1 day   Arley Hof, MD   04/23/2024, 1:41 PM

## 2024-04-24 ENCOUNTER — Telehealth: Payer: Self-pay | Admitting: *Deleted

## 2024-04-24 ENCOUNTER — Other Ambulatory Visit: Payer: Self-pay

## 2024-04-24 DIAGNOSIS — C61 Malignant neoplasm of prostate: Secondary | ICD-10-CM

## 2024-04-24 DIAGNOSIS — E870 Hyperosmolality and hypernatremia: Secondary | ICD-10-CM

## 2024-04-24 DIAGNOSIS — N1831 Chronic kidney disease, stage 3a: Secondary | ICD-10-CM | POA: Diagnosis not present

## 2024-04-24 DIAGNOSIS — N179 Acute kidney failure, unspecified: Secondary | ICD-10-CM | POA: Diagnosis not present

## 2024-04-24 DIAGNOSIS — Z7189 Other specified counseling: Secondary | ICD-10-CM

## 2024-04-24 DIAGNOSIS — Z515 Encounter for palliative care: Secondary | ICD-10-CM

## 2024-04-24 DIAGNOSIS — D61818 Other pancytopenia: Secondary | ICD-10-CM

## 2024-04-24 DIAGNOSIS — C179 Malignant neoplasm of small intestine, unspecified: Secondary | ICD-10-CM

## 2024-04-24 LAB — BASIC METABOLIC PANEL WITH GFR
Anion gap: 10 (ref 5–15)
BUN: 92 mg/dL — ABNORMAL HIGH (ref 8–23)
CO2: 24 mmol/L (ref 22–32)
Calcium: 8.5 mg/dL — ABNORMAL LOW (ref 8.9–10.3)
Chloride: 121 mmol/L — ABNORMAL HIGH (ref 98–111)
Creatinine, Ser: 5.74 mg/dL — ABNORMAL HIGH (ref 0.61–1.24)
GFR, Estimated: 10 mL/min — ABNORMAL LOW (ref >60)
Glucose, Bld: 146 mg/dL — ABNORMAL HIGH (ref 70–99)
Potassium: 3 mmol/L — ABNORMAL LOW (ref 3.5–5.1)
Sodium: 155 mmol/L — ABNORMAL HIGH (ref 135–145)

## 2024-04-24 LAB — TYPE AND SCREEN
ABO/RH(D): O POS
Antibody Screen: NEGATIVE
Unit division: 0

## 2024-04-24 LAB — GLUCOSE, CAPILLARY
Glucose-Capillary: 131 mg/dL — ABNORMAL HIGH (ref 70–99)
Glucose-Capillary: 177 mg/dL — ABNORMAL HIGH (ref 70–99)
Glucose-Capillary: 194 mg/dL — ABNORMAL HIGH (ref 70–99)
Glucose-Capillary: 182 mg/dL — ABNORMAL HIGH (ref 70–99)

## 2024-04-24 LAB — CBC WITH DIFFERENTIAL/PLATELET
Abs Immature Granulocytes: 0.01 K/uL (ref 0.00–0.07)
Basophils Absolute: 0 K/uL (ref 0.0–0.1)
Basophils Relative: 0 %
Eosinophils Absolute: 0 K/uL (ref 0.0–0.5)
Eosinophils Relative: 3 %
HCT: 26.6 % — ABNORMAL LOW (ref 39.0–52.0)
Hemoglobin: 8.3 g/dL — ABNORMAL LOW (ref 13.0–17.0)
Immature Granulocytes: 1 %
Lymphocytes Relative: 60 %
Lymphs Abs: 0.5 K/uL — ABNORMAL LOW (ref 0.7–4.0)
MCH: 29.3 pg (ref 26.0–34.0)
MCHC: 31.2 g/dL (ref 30.0–36.0)
MCV: 94 fL (ref 80.0–100.0)
Monocytes Absolute: 0.2 K/uL (ref 0.1–1.0)
Monocytes Relative: 20 %
Neutro Abs: 0.1 K/uL — CL (ref 1.7–7.7)
Neutrophils Relative %: 16 %
Platelets: 81 K/uL — ABNORMAL LOW (ref 150–400)
RBC: 2.83 MIL/uL — ABNORMAL LOW (ref 4.22–5.81)
RDW: 15.6 % — ABNORMAL HIGH (ref 11.5–15.5)
WBC: 0.8 K/uL — CL (ref 4.0–10.5)
nRBC: 2.7 % — ABNORMAL HIGH (ref 0.0–0.2)

## 2024-04-24 LAB — BPAM RBC
Blood Product Expiration Date: 202508242359
ISSUE DATE / TIME: 202507291750
Unit Type and Rh: 5100

## 2024-04-24 LAB — MAGNESIUM: Magnesium: 2.2 mg/dL (ref 1.7–2.4)

## 2024-04-24 LAB — PATHOLOGIST SMEAR REVIEW: Path Review: NEGATIVE

## 2024-04-24 MED ORDER — POLYETHYLENE GLYCOL 3350 17 G PO PACK
17.0000 g | PACK | Freq: Two times a day (BID) | ORAL | Status: AC
  Administered 2024-04-24 – 2024-04-25 (×3): 17 g via ORAL
  Filled 2024-04-24 (×4): qty 1

## 2024-04-24 MED ORDER — POTASSIUM CL IN DEXTROSE 5% 20 MEQ/L IV SOLN
20.0000 meq | INTRAVENOUS | Status: AC
  Administered 2024-04-24 – 2024-04-25 (×2): 20 meq via INTRAVENOUS
  Filled 2024-04-24 (×3): qty 1000

## 2024-04-24 MED ORDER — SORBITOL 70 % SOLN
30.0000 mL | Freq: Once | Status: AC
  Administered 2024-04-24: 30 mL via ORAL
  Filled 2024-04-24: qty 30

## 2024-04-24 NOTE — Evaluation (Signed)
 Occupational Therapy Evaluation Patient Details Name: Brandon Peters MRN: [id number] DOB: 07/22/1956 Today's Date: 04/24/2024   History of Present Illness   Pt is a 68 y/o male admitted from cancer center secondary to nausea and vomitting. Work-up in progress. PMH including but not limited to CKD stage IV (baseline of around 2.5), metastatic prostate cancer, renal cell carcinoma status post right nephrectomy in October 2023, metastatic small bowel carcinoma currently on chemotherapy with cycle 1 of FOLFOX on 04/09/2024, anemia of chronic disease, PE on Eliquis , hypertension, hyperlipidemia, diabetes mellitus type 2, admission at [facility] in 01/2024 for AKI on CKD stage IV treated with IV fluids     Clinical Impressions Pt c/o fatigue, weakness to BLEs. Pt lives alone, has two sisters who assist 24/7 the last few weeks with IADLs, and over the last few days has assisted with ambulation and LB ADLs. Pt currently needs min A for LB ADLs, has difficulty standing from low surfaces, ambulates very short distances with RW for support. Pt stand with min A to power STS, CGA for pivot to BSC. Pt reports he has been borrowing a RW and would like his own, would benefit from RW upon return home. HHOT recommended for follow up, will continue to see acutely to progress with functional independence and strength as able.      If plan is discharge home, recommend the following:   A little help with walking and/or transfers;A little help with bathing/dressing/bathroom;Assistance with cooking/housework;Assist for transportation;Help with stairs or ramp for entrance     Functional Status Assessment   Patient has had a recent decline in their functional status and demonstrates the ability to make significant improvements in function in a reasonable and predictable amount of time.     Equipment Recommendations   Other (comment) (RW)     Recommendations for Other Services         Precautions/Restrictions    Precautions Precautions: Fall Recall of Precautions/Restrictions: Intact Restrictions Weight Bearing Restrictions Per Provider Order: No     Mobility Bed Mobility Overal bed mobility: Needs Assistance Bed Mobility: Supine to Sit, Sit to Supine     Supine to sit: Supervision, Used rails Sit to supine: Min assist   General bed mobility comments: min A to return to bed for BLEs, able to use rails for supine to sit at supervision level    Transfers Overall transfer level: Needs assistance Equipment used: Rolling walker (2 wheels) Transfers: Sit to/from Stand, Bed to chair/wheelchair/BSC Sit to Stand: Min assist, From elevated surface     Step pivot transfers: Contact guard assist     General transfer comment: min A for STS from elevated surface, CGA to transfer to [facility]      Balance Overall balance assessment: Needs assistance Sitting-balance support: No upper extremity supported, Feet supported Sitting balance-Leahy Scale: Fair Sitting balance - Comments: EOB ADLs, able to lean L/R, difficulty leaning forward to don/doff socks   Standing balance support: Bilateral upper extremity supported, During functional activity, Reliant on assistive device for balance Standing balance-Leahy Scale: Poor Standing balance comment: reliant on RW for support                           ADL either performed or assessed with clinical judgement   ADL Overall ADL's : Needs assistance/impaired Eating/Feeding: Independent   Grooming: Set up;Sitting   Upper Body Bathing: Set up;Sitting   Lower Body Bathing: Minimal assistance;Sitting/lateral leans   Upper Body Dressing :  Set up;Sitting   Lower Body Dressing: Minimal assistance;Sit to/from stand   Toilet Transfer: Minimal assistance;Rolling walker (2 wheels);BSC/3in1   Toileting- Clothing Manipulation and Hygiene: Set up;Supervision/safety;Sitting/lateral lean       Functional mobility during ADLs: Contact guard  assist;Rolling walker (2 wheels) General ADL Comments: Pt min A for LB ADLs, min A for STS. Once upright with RW CGA for taking steps short distances. Pt has difficulty standing from low surfaces, toileting/hospital bed.     Vision Baseline Vision/History: 0 No visual deficits Ability to See in Adequate Light: 0 Adequate Patient Visual Report: No change from baseline       Perception         Praxis         Pertinent Vitals/Pain Pain Assessment Pain Assessment: No/denies pain     Extremity/Trunk Assessment Upper Extremity Assessment Upper Extremity Assessment: Overall WFL for tasks assessed   Lower Extremity Assessment Lower Extremity Assessment: Defer to PT evaluation       Communication Communication Communication: No apparent difficulties   Cognition Arousal: Alert Behavior During Therapy: WFL for tasks assessed/performed Cognition: No apparent impairments                               Following commands: Intact       Cueing  General Comments   Cueing Techniques: Verbal cues;Gestural cues      Exercises     Shoulder Instructions      Home Living Family/patient expects to be discharged to:: Private residence Living Arrangements: Alone Available Help at Discharge: Family;Available 24 hours/day Type of Home: House Home Access: Stairs to enter [employer] of Steps: 4 Entrance Stairs-Rails: None Home Layout: One level     Bathroom Shower/Tub: Chief Strategy Officer: Standard     Home Equipment: Cane - single point;BSC/3in1   Additional Comments: Pt lives alone but has two sisters awho can assist 24/7, has been borrowing as RW, needs his own      Prior Functioning/Environment Prior Level of Function : Independent/Modified Independent;Driving             Mobility Comments: independent ADLs Comments: independent, drivin up until a week ago when all of this began    OT Problem List: Decreased  strength;Decreased activity tolerance;Impaired balance (sitting and/or standing);Increased edema   OT Treatment/Interventions: Self-care/ADL training;Therapeutic exercise;Energy conservation;Therapeutic activities;Patient/family education      OT Goals(Current goals can be found in the care plan section)   Acute Rehab OT Goals Patient Stated Goal: to improve strength in BLEs OT Goal Formulation: With patient/family Time For Goal Achievement: 05/08/24 Potential to Achieve Goals: Good   OT Frequency:  Min 2X/week    Co-evaluation              AM-PAC OT 6 Clicks Daily Activity     Outcome Measure Help from another person eating meals?: None Help from another person taking care of personal grooming?: A Little Help from another person toileting, which includes using toliet, bedpan, or urinal?: A Little Help from another person bathing (including washing, rinsing, drying)?: A Little Help from another person to put on and taking off regular upper body clothing?: A Little Help from another person to put on and taking off regular lower body clothing?: A Little 6 Click Score: 19   End of Session Equipment Utilized During Treatment: Gait belt;Rolling walker (2 wheels) Nurse Communication: Mobility status  Activity Tolerance: Patient tolerated  treatment well Patient left: in bed;with call bell/phone within reach;with family/visitor present;Other (comment) (with SLP)  OT Visit Diagnosis: Unsteadiness on feet (R26.81);Other abnormalities of gait and mobility (R26.89);Muscle weakness (generalized) (M62.81)                Time: 8670-8643 OT Time Calculation (min): 27 min Charges:  OT General Charges $OT Visit: 1 Visit OT Evaluation $OT Eval Low Complexity: 1 Low OT Treatments $Self Care/Home Management : 8-22 mins  [address] OTR/L   Elouise JONELLE Bott 04/24/2024, 2:03 PM

## 2024-04-24 NOTE — Progress Notes (Addendum)
 IP PROGRESS NOTE  Subjective:   Mr. Stangl reports improvement in nausea.  He feels constipated.  Objective: Vital signs in last 24 hours: Blood pressure 106/70, pulse 88, temperature 97.9 F (36.6 C), temperature source Oral, resp. rate 18, weight 147 lb 14.9 oz (67.1 kg), SpO2 98%.  Intake/Output from previous day: 07/29 0701 - 07/30 0700 In: 1200.6 [P.O.:120; I.V.:612.7; Blood:367.9; IV Piggyback:100] Out: 1050 [Urine:1050]  Physical Exam:  HEENT: No thrush Lungs: Decreased breath sounds with inspiratory rhonchi at the lower posterior chest bilaterally, no respiratory distress Cardiac: Regular rate and rhythm Abdomen: Mildly distended, nontender, no mass Extremities: Trace edema at the left foot   Portacath/PICC-without erythema  Lab Results: Recent Labs    04/23/24 0915 04/24/24 0313  WBC 1.0* 0.8*  HGB 6.5* 8.3*  HCT 20.6* 26.6*  PLT 74* 81*    BMET Recent Labs    04/23/24 0915 04/24/24 0313  NA 151* 155*  K 2.8* 3.0*  CL 115* 121*  CO2 25 24  GLUCOSE 246* 146*  BUN 106* 92*  CREATININE 5.72* 5.74*  CALCIUM  8.5* 8.5*    Lab Results  Component Value Date   CEA1 3.31 05/03/2019   CEA 4.56 04/09/2024    Studies/Results: CT ABDOMEN PELVIS WO CONTRAST Result Date: 04/23/2024 CLINICAL DATA:  Abdominal pain, acute, nonlocalized EXAM: CT ABDOMEN AND PELVIS WITHOUT CONTRAST TECHNIQUE: Multidetector CT imaging of the abdomen and pelvis was performed following the standard protocol without IV contrast. RADIATION DOSE REDUCTION: This exam was performed according to the departmental dose-optimization program which includes automated exposure control, adjustment of the mA and/or kV according to patient size and/or use of iterative reconstruction technique. COMPARISON:  March 21, 2024 FINDINGS: Of note, the lack of intravenous contrast limits evaluation of the solid organ parenchyma and vascularity. Lower chest: Small bilateral pleural effusions with multifocal  atelectasis in the lung bases. Mild cardiomegaly. Partially visualized chest port terminating in the right atrium. Hepatobiliary: No mass.No radiopaque stones or wall thickening of the gallbladder. No intrahepatic or extrahepatic biliary ductal dilation. Pancreas: No mass or main ductal dilation. No peripancreatic inflammation or fluid collection. Spleen: Normal size. No mass. Adrenals/Urinary Tract: No adrenal masses. Right nephrectomy. No left renal mass. No hydronephrosis or nephrolithiasis. The urinary bladder is distended without focal abnormality. Stomach/Bowel: The stomach is decompressed without focal abnormality. No small bowel wall thickening or inflammation. No small bowel obstruction.Normal appendix. Vascular/Lymphatic: No aortic aneurysm. Diffuse aortoiliac atherosclerosis. Redemonstrated enlargement of the retroperitoneal lymphadenopathy, measuring 1.6 cm in the left periaortic region (previously, 1.4 cm). 1.2 cm mesenteric root lymph node is partially calcified, unchanged (axial 36). Reproductive: No prostatomegaly. Other: No pneumoperitoneum. Increasing intra-abdominal ascites, most notably in the perihepatic recesses, causing significant mass effect on the right hepatic lobe. A couple of small regions of nodularity noted in the fluid collection posteriorly (for example, axial 23), measuring 1.6 cm. Extensive omental soft tissue nodularity again noted. Musculoskeletal: No acute fracture or destructive lesion. Diffuse sclerotic metastases throughout the visualized skeleton again noted. IMPRESSION: 1. Increasing volume of malignant ascites, most notably within the perihepatic recesses causing mass effect on the right hepatic lobe. Diffuse peritoneal carcinomatosis is similar in appearance with a couple of small nodules developing in the perihepatic fluid. 2. Slight enlargement of 1 of the left periaortic lymph nodes measuring 1.6 cm in short axis dimension (previously, 1.4 cm). 3. Redemonstrated  sclerotic bony metastases diffusely throughout the skeleton. 4. Small bilateral pleural effusions, increasing in size in the interim. Aortic Atherosclerosis (ICD10-I70.0). Electronically Signed  By: Rogelia Myers M.D.   On: 04/23/2024 15:01   US  RENAL Result Date: 04/22/2024 CLINICAL DATA:  Acute renal failure.  Status post right nephrectomy. EXAM: RENAL / URINARY TRACT ULTRASOUND COMPLETE COMPARISON:  CT 03/21/2024 FINDINGS: Right Kidney: Absent Left Kidney: Renal measurements: 10.3 x 5.8 x 5.0 cm = volume: 157 mL. Echogenicity within normal limits. No mass or hydronephrosis visualized. Bladder: Appears normal for degree of bladder distention. Left ureteral jet identified Other: Complex moderate perihepatic and perisplenic ascites is present with scalloping of the liver margin better seen on prior CT examination IMPRESSION: 1. Normal sonographic appearance of the LEFT kidney. 2. Status post RIGHT nephrectomy. 3. Complex moderate perihepatic and perisplenic ascites with scalloping of the liver margin better seen on prior CT examination. Electronically Signed   By: Dorethia Molt M.D.   On: 04/22/2024 22:35    Medications: I have reviewed the patient's current medications.  Assessment/Plan: History of Severe anemia-status post a red cell transfusion 03/04/2016 2.. Prostate cancer Extensive sclerotic bone metastases noted on a CT of the abdomen/pelvis 03/04/2016 and MRI abdomen 03/05/2016 Markedly elevated PSA Lupron  initiated 03/15/2016; Casodex  14 days beginning 03/15/2016 Prostate biopsy 03/18/2016 05/09/2016 PSA significantly improved at 21 Abiraterone  initiated 05/10/2016 PSA less than 0.1 07/10/2018 PSA less than 0.1 08/30/2018 PSA less than 0.1 10/18/2018 PSA less than 0.1 11/29/2018 PSA less than 0.1 on 11/06/2019  PSA less than 0.1 on 03/05/2020 PSA less than 0.1 on 07/06/2020 PSA less than 0.1 08/17/2021 PSA less than 0.1 on 09/30/2021 PSA 0.1 on 11/12/2021 PSA 0.1 on 02/18/2022 PSA 0.2  on 12/09/2022 PSA 0.3 on 04/04/2023  PSA 0.4 on 06/06/2023 PSA 0.5 on 07/05/2023 PSA 0.7 on 08/09/2023 PSA 1.5 on 12/07/2023 PSA 4.7 on 02/06/2024 PSA 6.37 on 02/14/2024 PSA 5.0 on 02/22/2024 PSA 7.3 on 03/28/2024     3.   Symptoms of obstructive uropathy. Improved   4.   Distal duodenal and descending colon masses biopsy of the duodenal mass 03/04/2016 confirmed fragments of small bowel mucosa with focal high-grade dysplasia Biopsy of the descending colon polyps revealed fragments of a tubulovillous adenoma CT 08/29/2017-similar soft tissue thickening in the region of the splenic flexure of the colon suboptimally evaluated due to lack of colonic enteric contrast Referred to Dr. Rollin Referred to [facility] Resection of descending colon polyps 01/31/2018, tubulovillous adenomas with high-grade dysplasia Attempted endoscopic resection of the fourth segment duodenal polyp on 05/09/2018, firm unresectable lesion, biopsy revealed moderately differentiated adenocarcinoma CT abdomen/pelvis 05/25/2018- heterogeneously enhancing right lower pole renal lesion; diffuse sclerotic osseous metastatic lesions compatible with history of prostate cancer CT chest 05/25/2018- no pulmonary metastasis.  Diffuse osseous metastasis involving the axial and appendicular skeleton. 06/19/2018-sleeve resection of third and fourth portions of duodenum with primary end-to-end anastomosis, feeding gastrojejunostomy tube placement, Dr. Ames; poorly differentiated adenocarcinoma duodenum, 3 cm; tumor invades the muscularis propria; lymphovascular invasion present; negative margins; 4 of 11 involved lymph nodes; soft tissue deposits also seen in the mesentery; all mucosal margins and soft tissue margins negative but 2 of the positive nodes are found in the soft tissue margin submitted for frozen section (pT2 pN2); second mucosal lesion 1.2 cm from the proximal margin, tubular adenoma with focal severe dysplasia Intact expression of mismatch  repair proteins by IHC; microsatellite stable Cycle 1 adjuvant Xeloda  07/23/2018 Cycle 2 adjuvant Xeloda  08/13/2018 Cycle 3 adjuvant Xeloda  09/10/2018 (dose reduced to 1000 mg twice daily for 14 days)  Cycle 4 adjuvant Xeloda  10/01/2018 (dose increased to 1500 mg every morning  and 1000 mg every afternoon for 14 days) Cycle 5 adjuvant Xeloda  10/22/2018 Cycle 6 adjuvant Xeloda  11/12/2018 Cycle 7 adjuvant Xeloda  12/03/2018 Cycle 8 adjuvant Xeloda  12/24/2018   5.  Anorexia/weight loss- resolved   6.  Undescended testicles   7.  Right renal mass.  CT 08/29/2017-right renal mass involving the lower pole was partially calcified and does demonstrate enhancement following contrast most consistent with renal cell carcinoma.  Lesion not significantly enlarged compared with the prior CT. Followed by urology. 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 to the IVC/RA junction, upper normal size retrocaval node 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, nuclear grade 3 Axitinib /pembrolizumab  10/22/2021 Axitinib  placed on hold 11/05/2021 due to hand-foot syndrome Axitinib  resumed at a dose of 5 mg daily 11/12/2021, pembrolizumab  11/12/2021 Axitinib  discontinued 12/03/2021 secondary to progressive hand-foot syndrome, pembrolizumab  12/03/2021 Axitinib  resumed at a reduced dose of 2 mg twice daily after office visit 12/14/2021 Pembrolizumab  12/24/2021 Axitinib  stopped 01/03/2022 secondary to  persistent hand/foot symptoms CT chest at [facility] 01/13/2022-no evidence of thoracic metastatic disease, similar diffuse sclerotic change in the skeleton MRI abdomen at [facility] 01/13/2022-similar size of right lower pole renal mass with renal vein thrombosis, decreased overall extent of thrombus now extending to the intrahepatic IVC, enhancement of right renal mass and thrombus is decreased, new left hydroureter ureter nephrosis with asymmetric perinephric stranding Pembrolizumab  01/27/2022 Axitinib  resumed 2 mg daily 01/28/2022 CT abdomen/pelvis at [facility] 02/10/2022-overall reduced size of right lower pole renal mass which demonstrates persistent region of enhancement along the posterior and lateral margin with extension into the renal sinus.  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  Query additional IVC thrombus at the intrahepatic IVC and extending into the right atrium. Axitinib  continued 2 mg daily, Pembrolizumab  every 3 weeks CT 05/05/2022 at [facility]-partially visualized pulmonary embolism involving segmental arteries of the right lower lobe.  Interval slight decrease in size of the mass arising from the lower pole the right kidney.  Mass demonstrates persistent moderate necrosis centrally.  Increased enhancing soft tissue component measuring up to 2 cm which is mildly increased in size compared to prior from 02/10/2022.  Bland tumor thrombus identified in the IVC at the level of the right renal vein ostium extending into the suprarenal IVC superiorly.  No obvious thrombus identified in the infrarenal, intrahepatic and suprahepatic IVC.  Likely trace bland thrombus in the anterior right renal vein.  Stable several subcentimeter retroperitoneal lymph nodes.  Diffuse osseous sclerotic and lytic metastatic disease stable compared to prior. Axitinib  placed on hold 05/04/2022 due to pain bilateral feet; 05/13/2022 he will continue to hold axitinib  Pembrolizumab  05/13/2022 Pembrolizumab  06/03/2022 Robotic  right radical nephrectomy and IVC thrombectomy 06/27/2022-right kidney with tumor necrosis/hemorrhage,ypT0, adherent thrombus with cellular necrosis and no viable carcinoma present at the renal vein margin, other margins negative for carcinoma, 0/3 lymph nodes.  6.5 cm tumor including intraparenchymal lesion and thrombus in the renal vein 8.  Diabetes   9.  Anemia-stool Hemoccults + March 2021, referred to Dr. Rollin for endoscopic evaluation, anemia also  secondary to metastatic prostate cancer, hypogonadism Small bowel enteroscopy 02/11/2020-esophagus normal.  Hematin found in the gastric body.  Examined duodenum normal.  No evidence of significant pathology in the entire examined portion of the jejunum.  There was no overt source of bleeding or inflammation.  The bleeding may be from small AVMs.  Surgical anastomosis was not visible with repeated inspection of the duodenum. Colonoscopy 02/11/2020-multiple sessile and semipedunculated polyps were found in the rectum, descending colon and ascending colon, 3 to 18 mm in size.  Polyps removed (polypectomy descending colon-fragments of tubular adenoma, inflammatory type polyp; polypectomy ascending colon-tubular adenoma, no high-grade dysplasia or malignancy; polypectomy descending colon-fragments of tubulovillous adenoma, no high-grade dysplasia or malignancy; polypectomy colon, rectum-tubular adenoma, no high-grade dysplasia or malignancy). Colonoscopy 02/02/2021-seven 2 to 10 mm polyps in the sigmoid colon (tubular adenoma), descending colon (fragments of tubulovillous adenoma, fragments of tubular adenoma ) and transverse colon (fragments of tubular adenoma). Progressive anemia 07/05/2021; stool cards positive Upper endoscopy/colonoscopy 09/23/2021-multiple polyps removed from the colon-tubular adenomas, diffuse angioectasias in the stomach with bleeding-not amenable to endoscopic treatment 10.  Renal failure-progressive 01/17/2022, progressive following right  nephrectomy October 2023 11.  CT chest at [facility] 01/13/2022-no evidence of thoracic metastatic disease, similar diffuse sclerotic changes in the skeleton 12.  CT renal stone study 01/14/2022-mild left side hydroureteronephrosis secondary to a left UVJ stone, left nephrolithiasis, new right middle lobe collapse/consolidation, edema/fluid at the left iliopsoas muscle 13.  PE noted on CT 05/05/2022-on apixaban .  He saw Dr. Darcy at [facility] 05/16/2022; instructions are to hold apixaban  72 hours prior to scheduled surgery and start Lovenox at prophylactic dosing. 14.  Metastatic small bowel carcinoma  CTs at [facility] 02/13/2024-new periaortic lymphadenopathy and peritoneal and omental carcinomatosis, small amount of new ascites, similar-appearing diffuse bony metastatic disease, focal thickening of the gastric body Omental biopsy 03/07/2024-metastatic moderately differentiated adenocarcinoma compatible with GI/intestinal primary; foundation 1 microsatellite stable, tumor mutation burden 1, ERBB2 amplification equivocal, NRAS Q61H; HER2 positive FISH CT 03/21/2024-similar abdominal retroperitoneal nodal metastasis.  1 node measuring minimally larger and the other minimally smaller.  Similar omental/peritoneal metastasis with mild increase in small volume abdominopelvic ascites.  Similar diffuse osseous metastasis.  New tiny left pleural effusion. Cycle 1 FOLFOX 04/09/2024  15.  Admission 04/22/2024 with nausea/vomiting and progressive renal failure  Mr Nimmons is now at day 16 following cycle 1 FOLFOX given for treatment of metastatic small bowel carcinoma.  He was admitted  with intractable nausea/vomiting and progressive renal failure.  The and vomiting began approximately 1 week following chemotherapy.  I think it is unlikely GI symptoms are related to chemotherapy, but possible.  I think is more likely the nausea is related to carcinomatosis.  The CT Abdo/pelvis yesterday reveals increased ascites and persistent carcinomatosis.   No bowel obstruction.  He has developed delayed severe pancytopenia.  The neutropenia/thrombocytopenia are secondary to chemotherapy.  The anemia is secondary to chronic renal failure, metastatic prostate cancer involving the bone marrow,chemotherapy, and potentially GI bleeding.  I discussed goals of care with Mr. Wescoat yesterday.  He wants to continue treatment of the cancer and would like to remain on a full CODE STATUS.  He is considering whether to undergo hemodialysis if this is due to/recommended by the nephrology service. He has a poor prognosis with the renal failure, metastatic prostate cancer, and metastatic small bowel cancer.  I will continue discussions with him regarding medication for continuing chemotherapy versus hospice.  Recommendations: Daily CBC with differential Antibiotics for presumed UTI Blood  cultures, broaden antibiotic coverage for a fever Continue management of renal failure per nephrology Advance diet as tolerated Bowel regimen      LOS: 2 days   Arley Hof, MD   04/24/2024, 7:55 AM

## 2024-04-24 NOTE — Progress Notes (Signed)
 Speech Language Pathology Treatment: Dysphagia  Patient Details Name: Brandon Peters MRN: [id number] DOB: 1956-02-12 Today's Date: 04/24/2024 Time: 1337-1400 SLP Time Calculation (min) (ACUTE ONLY): 23 min  Assessment / Plan / Recommendation Clinical Impression  Pt seen for a f/u dysphagia tx with clear liquids and puree consistencies with pt independently consuming small bites and utilizing a slow rate.  Discussed medical dx with need for full liquids and esophageal swallow strategies to utilize to maximize consumption/safety during meals/snacks.  Pt noted globus sensation/regurgitation with pills voicing pills get stuck sometimes.  Discussed using a cohesive bolus to A with pills and/or repetitive swallows and positioning head back to utilize gravity to A with transition into esophagus. Discussed psychological aspect of swallowing when averse reactions present during po consumption.  Pt agreeable and utilized all compensatory strategies given during session with limited consistencies.  Pt did indicate he had seen a GI doctor in the past and was currently taking medication for reflux.  ST will continue to f/u for dysphagia tx during acute stay for potential diet upgrade as pt able.  HPI HPI: Patient is a 68 y.o. 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 He presented to the hospital on 7/28 from cancer center secondary to nausea and vomiting. He was admitted with acute metabolic acidosis, AKI on CKD stage IV, pancytopenia, intractable nausea and vomiting, hypernatremia. He was started on a full liquid diet. ST f/u for diet tolerance/education.      SLP Plan  Continue with current plan of care          Recommendations  Diet recommendations: Thin liquid Liquids provided via: Cup;Straw Medication Administration:   (as tolerated; more cohesive bolus may be helpful (ie: pudding)) Supervision: Patient able to self feed Compensations: Slow rate;Small sips/bites;Follow solids with liquid;Multiple dry swallows after each bite/sip;Effortful swallow Postural Changes and/or Swallow Maneuvers: Seated upright 90 degrees;Upright 30-60 min after meal                  Oral care BID     Dysphagia, unspecified (R13.10)     Continue with current plan of care     Pat Keonda Dow,M.S.,CCC-SLP  04/24/2024, 2:26 PM

## 2024-04-24 NOTE — Telephone Encounter (Signed)
 Returned call from sister, Brandon Peters asking for update on Brandon Peters's condition. Informed her that CT shows ascites and increase in carcinomatosis. Blood counts are low and kidney functions are still poor. Nephrology is seeing him as well. Dr. Cloretta spoke w/Brandon Peters regarding continued chemo vs comfort care and feels continued treatment would not be in his best interest, but Brandon Peters wants to continue chemo. He will continue to discuss this with him. MD will be in room early in am if she wishes to speak w/him. Sister is thinking comfort care would be best for him.

## 2024-04-24 NOTE — Progress Notes (Signed)
 Physical Therapy Treatment Patient Details Name: Brandon Peters MRN: [id number] DOB: 1955-10-22 Today's Date: 04/24/2024   History of Present Illness Pt is a 68 y/o male admitted from cancer center secondary to nausea and vomitting. Work-up in progress. PMH including but not limited to CKD stage IV (baseline of around 2.5), metastatic prostate cancer, renal cell carcinoma status post right nephrectomy in October 2023, metastatic small bowel carcinoma currently on chemotherapy with cycle 1 of FOLFOX on 04/09/2024, anemia of chronic disease, PE on Eliquis , hypertension, hyperlipidemia, diabetes mellitus type 2, admission at [facility] in 01/2024 for AKI on CKD stage IV treated with IV fluids    PT Comments  Continuing work on functional mobility and activity tolerance;  Session focused on transfer training and progressive amb; Initial sit to stand pt needed solid mod assist to power up from EOB; better performance with less assist with standing from recliner using armrests; Good use of rW with amb; noted he told OT he is borrowing his and is interested in getting one of his own; updated recs to include RW, and we can consider rollator in next sessions    If plan is discharge home, recommend the following: A little help with bathing/dressing/bathroom;A little help with walking and/or transfers;Assistance with cooking/housework;Help with stairs or ramp for entrance   Can travel by private vehicle        Equipment Recommendations  Wheelchair (measurements PT);Wheelchair cushion (measurements PT);Rolling walker (2 wheels) (may consider rollator)    Recommendations for Other Services       Precautions / Restrictions Precautions Precautions: Fall Recall of Precautions/Restrictions: Intact Restrictions Weight Bearing Restrictions Per Provider Order: No     Mobility  Bed Mobility Overal bed mobility: Needs Assistance Bed Mobility: Supine to Sit     Supine to sit: Min assist     General bed mobility  comments: increased time and effort, min A for bilateral LE management off of and back onto bed    Transfers Overall transfer level: Needs assistance Equipment used: Rolling walker (2 wheels) Transfers: Sit to/from Stand Sit to Stand: Mod assist           General transfer comment: Solid mod assist to power up to stand from EOB; CGA assist from recliner with good use of armrests to push off    Ambulation/Gait Ambulation/Gait assistance: Contact guard assist Gait Distance (Feet): 20 Feet (10x2) Assistive device: Rolling walker (2 wheels) Gait Pattern/deviations: Step-through pattern, Decreased stride length       General Gait Details: pt with slow but overal steady gait with RW within his room, to the door and back   Stairs             Wheelchair Mobility     Tilt Bed    Modified Rankin (Stroke Patients Only)       Balance     Sitting balance-Leahy Scale: Fair       Standing balance-Leahy Scale: Poor Standing balance comment: reliant on RW for support                            Communication Communication Communication: No apparent difficulties  Cognition Arousal: Alert Behavior During Therapy: WFL for tasks assessed/performed   PT - Cognitive impairments: No apparent impairments                         Following commands: Intact      Cueing Cueing Techniques: Verbal cues,  Gestural cues  Exercises      General Comments General comments (skin integrity, edema, etc.): Wife, Brandon Peters, present and helpful      Pertinent Vitals/Pain Pain Assessment Pain Assessment: No/denies pain Pain Intervention(s): Monitored during session    Home Living Family/patient expects to be discharged to:: Private residence Living Arrangements: Alone Available Help at Discharge: Family;Available 24 hours/day Type of Home: House Home Access: Stairs to enter Entrance Stairs-Rails: None Entrance Stairs-Number of Steps: 4   Home Layout: One  level Home Equipment: Cane - single point;BSC/3in1 Additional Comments: Pt lives alone but has two sisters awho can assist 24/7, has been borrowing as RW, needs his own    Prior Function            PT Goals (current goals can now be found in the care plan section) Acute Rehab PT Goals Patient Stated Goal: to feel better PT Goal Formulation: With patient/family Time For Goal Achievement: 05/07/24 Potential to Achieve Goals: Good Progress towards PT goals: Progressing toward goals    Frequency    Min 2X/week      PT Plan      Co-evaluation              AM-PAC PT 6 Clicks Mobility   Outcome Measure  Help needed turning from your back to your side while in a flat bed without using bedrails?: A Little Help needed moving from lying on your back to sitting on the side of a flat bed without using bedrails?: A Little Help needed moving to and from a bed to a chair (including a wheelchair)?: A Lot Help needed standing up from a chair using your arms (e.g., wheelchair or bedside chair)?: A Little Help needed to walk in hospital room?: A Little Help needed climbing 3-5 steps with a railing? : A Lot 6 Click Score: 16    End of Session   Activity Tolerance: Patient limited by fatigue Patient left: in chair;with call bell/phone within reach;Other (comment) (On BSC near bed) Nurse Communication: Mobility status;Other (comment) (and pt is on [facility]) PT Visit Diagnosis: Other abnormalities of gait and mobility (R26.89)     Time: 8784-8752 PT Time Calculation (min) (ACUTE ONLY): 32 min  Charges:    $Gait Training: 8-22 mins $Therapeutic Activity: 8-22 mins PT General Charges $$ ACUTE PT VISIT: 1 Visit                     Silvano Currier, PT  Acute Rehabilitation Services Office [phone] Secure Chat welcomed    Silvano VEAR Currier 04/24/2024, 2:18 PM

## 2024-04-24 NOTE — Progress Notes (Signed)
 [facility] PROGRESS NOTE    Progress Note  Brandon Peters  FMW:[id number] DOB: [birth year] DOA: 04/22/2024 PCP: Bari Morgans, NP     Brief Narrative:   Brandon Peters is an 68 y.o. 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  Sent from the cancer center on the day of admission for sodium of 154 bicarb of 14 creatinine of 6.2, white blood cell count of 1.9 hemoglobin of 8 platelet count of 82 transferred to [facility]     Assessment/Plan:   AKI (acute kidney injury) (HCC) on chronic kidney disease stage IV, with high anion gap anion gap metabolic acidosis: On admission creatinine was 6.2, now this morning 5.7 anion gap metabolic acidosis resolved. Renal ultrasound unremarkable left kidney with right nephrectomy. CT scan of the abdomen pelvis showed increase in size of malignant ascites, and perihepatic mass effect in the right hepatic lobe, with diffuse needle carcinomatosis.  Sclerotic bony metastases throughout the skeletal and small bilateral pleural effusion. Nephrology was consulted and was concerned of intractable nausea and vomiting in the setting of chemotherapy. Sodium bicarb was stopped due to peripheral edema and third spacing. They are hopeful that his creatinine will improve no indication for hemodialysis at this point in time. Consult palliative care  Hyponatremia with free water deficit: IV fluids stopped encourage free water per mouth. Sodium is worse this morning further management per nephrology. Sodium bicarb drip, agree as  he is third spacing.  Intractable nausea and vomiting: On Protonix  twice a day CT scan of the abdomen pelvis unremarkable. On a full liquid diet. Question of due to chemotherapy.  Pancytopenia/neutropenia: Likely due to chemotherapy no evidence of bleeding. Hemoglobin yesterday was 6.5 transfused units packed red blood cells hemoglobin this morning is 8.3. Oncology on board.  Continue CBCs daily and Rocephin  for fevers and neutropenia.  Metastatic small bowel cancer currently on chemotherapy: Last chemo 04/09/2024. Oncology on board.  Will discuss with oncology to see if he is candidate for needle site stimulating factor as his white blood cell count is trending down.  Controlled type 2 diabetes mellitus without complication, without long-term current use of insulin   Blood glucose well-controlled continue sliding scale insulin . Last hemoglobin A1c of 7.2.  Essential hypertension Off antihypertensive medication, relatively well-controlled.  Chronic pulmonary embolism (HCC) Holding Eliquis , will discuss with oncology to see if we can restart Eliquis . Currently on heparin  for DVT prophylaxis. Platelet count is 81 and has remained relatively stable.  Physical deconditioning: PT OT evaluated.  Goals of care: Overall very poor prognosis palliative care was been consulted and waiting recommendation. Agree with nephrology and we recommend hospice.   DVT prophylaxis: heparin  Family Communication:none Status is: Inpatient Remains inpatient appropriate because: Acute kidney injury    Code Status:     Code Status Orders  (From admission, onward)           Start     Ordered   04/22/24 1701  Full code  Continuous       Question:  By:  Answer:  Consent: discussion documented in EHR   04/22/24 1703  Code Status History     Date Active Date Inactive Code Status Order ID Comments User Context   04/08/2024 1622 04/09/2024 0510 Full Code [id number]  Vanice Sharper,  MD HOV   03/07/2024 1126 03/08/2024 0509 Full Code [id number]  Jenna Cordella LABOR, MD HOV   01/14/2022 1843 01/16/2022 2020 Full Code [id number]  Ricky Alfrieda DASEN, DO Inpatient   03/03/2016 1628 03/06/2016 1735 Full Code [id number]  Jeannetta Lonni ORN, MD ED         IV Access:   Peripheral IV   Procedures and diagnostic studies:   CT ABDOMEN PELVIS WO CONTRAST Result Date: 04/23/2024 CLINICAL DATA:  Abdominal pain, acute, nonlocalized EXAM: CT ABDOMEN AND PELVIS WITHOUT CONTRAST TECHNIQUE: Multidetector CT imaging of the abdomen and pelvis was performed following the standard protocol without IV contrast. RADIATION DOSE REDUCTION: This exam was performed according to the departmental dose-optimization program which includes automated exposure control, adjustment of the mA and/or kV according to patient size and/or use of iterative reconstruction technique. COMPARISON:  March 21, 2024 FINDINGS: Of note, the lack of intravenous contrast limits evaluation of the solid organ parenchyma and vascularity. Lower chest: Small bilateral pleural effusions with multifocal atelectasis in the lung bases. Mild cardiomegaly. Partially visualized chest port terminating in the right atrium. Hepatobiliary: No mass.No radiopaque stones or wall thickening of the gallbladder. No intrahepatic or extrahepatic biliary ductal dilation. Pancreas: No mass or main ductal dilation. No peripancreatic inflammation or fluid collection. Spleen: Normal size. No mass. Adrenals/Urinary Tract: No adrenal masses. Right nephrectomy. No left renal mass. No hydronephrosis or nephrolithiasis. The urinary bladder is distended without focal abnormality. Stomach/Bowel: The stomach is decompressed without focal abnormality. No small bowel wall thickening or inflammation. No small bowel obstruction.Normal appendix. Vascular/Lymphatic: No aortic aneurysm. Diffuse aortoiliac atherosclerosis. Redemonstrated enlargement of the retroperitoneal lymphadenopathy,  measuring 1.6 cm in the left periaortic region (previously, 1.4 cm). 1.2 cm mesenteric root lymph node is partially calcified, unchanged (axial 36). Reproductive: No prostatomegaly. Other: No pneumoperitoneum. Increasing intra-abdominal ascites, most notably in the perihepatic recesses, causing significant mass effect on the right hepatic lobe. A couple of small regions of nodularity noted in the fluid collection posteriorly (for example, axial 23), measuring 1.6 cm. Extensive omental soft tissue nodularity again noted. Musculoskeletal: No acute fracture or destructive lesion. Diffuse sclerotic metastases throughout the visualized skeleton again noted. IMPRESSION: 1. Increasing volume of malignant ascites, most notably within the perihepatic recesses causing mass effect on the right hepatic lobe. Diffuse peritoneal carcinomatosis is similar in appearance with a couple of small nodules developing in the perihepatic fluid. 2. Slight enlargement of 1 of the left periaortic lymph nodes measuring 1.6 cm in short axis dimension (previously, 1.4 cm). 3. Redemonstrated sclerotic bony metastases diffusely throughout the skeleton. 4. Small bilateral pleural effusions, increasing in size in the interim. Aortic Atherosclerosis (ICD10-I70.0). Electronically Signed   By: Rogelia Myers M.D.   On: 04/23/2024 15:01   US  RENAL Result Date: 04/22/2024 CLINICAL DATA:  Acute renal failure.  Status post right nephrectomy. EXAM: RENAL / URINARY TRACT ULTRASOUND COMPLETE COMPARISON:  CT 03/21/2024 FINDINGS: Right Kidney: Absent Left Kidney: Renal measurements: 10.3 x 5.8 x 5.0 cm = volume: 157 mL. Echogenicity within normal limits. No mass or hydronephrosis visualized. Bladder: Appears normal for degree of bladder distention. Left ureteral jet identified Other: Complex moderate perihepatic and perisplenic ascites is present with scalloping of the liver margin better seen on prior CT examination IMPRESSION: 1. Normal sonographic  appearance of the LEFT kidney. 2.  Status post RIGHT nephrectomy. 3. Complex moderate perihepatic and perisplenic ascites with scalloping of the liver margin better seen on prior CT examination. Electronically Signed   By: Dorethia Molt M.D.   On: 04/22/2024 22:35     Medical Consultants:   None.   Subjective:    Jaquawn Saffran feels good no complaints.  Objective:    Vitals:   04/23/24 1812 04/23/24 2100 04/24/24 0407 04/24/24 0728  BP: 114/66 122/63 119/67 106/70  Pulse: 72 82 98 88  Resp: 18 17 20 18   Temp: 98.4 F (36.9 C) 98.3 F (36.8 C) 98.7 F (37.1 C) 97.9 F (36.6 C)  TempSrc: Oral Oral Oral Oral  SpO2: 96% 96% 98% 98%  Weight:   67.1 kg    SpO2: 98 %   Intake/Output Summary (Last 24 hours) at 04/24/2024 0743 Last data filed at 04/24/2024 0600 Gross per 24 hour  Intake 1200.59 ml  Output 1050 ml  Net 150.59 ml   Filed Weights   04/23/24 0711 04/24/24 0407  Weight: 66.5 kg 67.1 kg    Exam: General exam: In no acute distress. Respiratory system: Good air movement and clear to auscultation. Cardiovascular system: S1 & S2 heard, RRR. No JVD. Gastrointestinal system: Abdomen is nondistended, soft and nontender.  Extremities: No pedal edema. Skin: No rashes, lesions or ulcers Psychiatry: Judgement and insight appear normal. Mood & affect appropriate.    Data Reviewed:    Labs: Basic Metabolic Panel: Recent Labs  Lab 04/22/24 1215 04/23/24 0757 04/23/24 0915 04/24/24 0313  NA 154* 150* 151* 155*  K 3.8 2.9* 2.8* 3.0*  CL 120* 116* 115* 121*  CO2 14* 24 25 24   GLUCOSE 233* 211* 246* 146*  BUN 108* 110* 106* 92*  CREATININE 6.27* 5.80* 5.72* 5.74*  CALCIUM  10.2 8.6* 8.5* 8.5*  MG  --  2.4  --  2.2   GFR Estimated Creatinine Clearance: 11 mL/min (A) (by C-G formula based on SCr of 5.74 mg/dL (H)). Liver Function Tests: Recent Labs  Lab 04/22/24 1215 04/23/24 0757  AST 29 24  ALT 27 19  ALKPHOS 108 64  BILITOT 0.4 0.5  PROT 6.6 5.1*   ALBUMIN 3.4* 2.1*   No results for input(s): LIPASE, AMYLASE in the last 168 hours. No results for input(s): AMMONIA in the last 168 hours. Coagulation profile No results for input(s): INR, PROTIME in the last 168 hours. COVID-19 Labs  No results for input(s): DDIMER, FERRITIN, LDH, CRP in the last 72 hours.  Lab Results  Component Value Date   SARSCOV2NAA NEGATIVE 01/14/2022    CBC: Recent Labs  Lab 04/19/24 0800 04/22/24 1215 04/23/24 0915 04/24/24 0313  WBC 4.1 1.9* 1.0* 0.8*  NEUTROABS 3.3 1.1* 0.3* 0.1*  HGB 10.2* 8.0* 6.5* 8.3*  HCT 32.8* 26.1* 20.6* 26.6*  MCV 95.1 95.3 93.6 94.0  PLT 90* 82* 74* 81*   Cardiac Enzymes: No results for input(s): CKTOTAL, CKMB, CKMBINDEX, TROPONINI in the last 168 hours. BNP (last 3 results) No results for input(s): PROBNP in the last 8760 hours. CBG: Recent Labs  Lab 04/22/24 2025 04/23/24 1124 04/23/24 1642 04/23/24 2134 04/24/24 0736  GLUCAP 202* 240* 170* 147* 131*   D-Dimer: No results for input(s): DDIMER in the last 72 hours. Hgb A1c: Recent Labs    04/23/24 0758  HGBA1C 7.2*   Lipid Profile: No results for input(s): CHOL, HDL, LDLCALC, TRIG, CHOLHDL, LDLDIRECT in the last 72 hours. Thyroid  function studies: No results for input(s): TSH, T4TOTAL, T3FREE, THYROIDAB  in the last 72 hours.  Invalid input(s): FREET3 Anemia work up: No results for input(s): VITAMINB12, FOLATE, FERRITIN, TIBC, IRON, RETICCTPCT in the last 72 hours. Sepsis Labs: Recent Labs  Lab 04/19/24 0800 04/22/24 1215 04/23/24 0915 04/24/24 0313  WBC 4.1 1.9* 1.0* 0.8*   Microbiology No results found for this or any previous visit (from the past 240 hours).   Medications:    sodium chloride    Intravenous Once   Chlorhexidine  Gluconate Cloth  6 each Topical Daily   feeding supplement  1 Container Oral TID BM   heparin   5,000 Units Subcutaneous Q8H   insulin  aspart   0-5 Units Subcutaneous QHS   insulin  aspart  0-9 Units Subcutaneous TID WC   pantoprazole  (PROTONIX ) IV  40 mg Intravenous Q12H   sodium chloride  flush  10-40 mL Intracatheter Q12H   Continuous Infusions:  cefTRIAXone  (ROCEPHIN )  IV Stopped (04/23/24 2142)      LOS: 2 days   Erle Odell Castor  [facility]  04/24/2024, 7:43 AM

## 2024-04-24 NOTE — Consult Note (Addendum)
 Palliative Care Consult Note                                  Date: 04/24/2024   Patient Name: Brandon Peters  DOB: 07-Aug-1956  MRN: [id number]  Age / Sex: 68 y.o., male  PCP: Bari Morgans, NP Referring Physician: Odell Castor, Erle, MD  Reason for Consultation: Establishing goals of care  Past Medical History:  Diagnosis Date   ARF (acute renal failure) (HCC)    d/t lisinopril and/or diazide- was on both in a 2 week period- cr up to 4.99   Diabetes mellitus    Family history of lung cancer    Hyperlipidemia    Hypertension    Personal history of other malignant neoplasm of small intestine    RENAL CALCULUS, HX OF 06/26/2007    Subjective:   This NP Camellia Kays reviewed medical records, received report from team, assessed the patient and then meet at the patient's bedside to discuss diagnosis, prognosis, GOC, EOL wishes disposition and options.  Before meeting with the patient/family, I spent time reviewing the chart including admission H&P, nephrology notes from yesterday and today, hospitalist after answering today, oncology note from today.  Also reviewed vital signs, medication administration record, nursing flowsheets.  I met with the patient at bedside, also present were his sisters Brandon Peters and Brandon Peters.   We meet to discuss diagnosis prognosis, GOC, EOL wishes, disposition and options. Concept of Palliative Care was introduced as specialized medical care for people and their families living with serious illness.  If focuses on providing relief from the symptoms and stress of a serious illness.  The goal is to improve quality of life for both the patient and the family. Values and goals of care important to patient and family were attempted to be elicited.  Created space and opportunity for patient  and family to explore thoughts and feelings regarding current medical situation   Natural trajectory and current clinical status were  discussed. Questions and concerns addressed. Patient  encouraged to call with questions or concerns.    Patient/Family Understanding of Illness: Understands he has prostate cancer that he has been fighting for 8 years, he takes a pill and prednisone  for this.  He also has small bowel cancer found 2 months ago and had originally had surgery that they felt like, but now it is in his stomach and he received 1 round of chemo.  Since chemo things that he used to do have shut down such as intake, ability to swallow pills.  All this started 5 days after chemo.  He has 1 kidney and is now having problems.  He was told his creatinine needs to be closer to 2 to be able to leave the hospital.  He came in originally from a lot of nausea and vomiting after chemotherapy.  We spent a great deal of time discussing details about his clinical situation.  Life Review: The patient has 2 sisters and 1 brother surviving.  1 sister and 1 brother previously passed away.  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  Earlier in life he shares stories about his friend's grandfather who took him cutting grass and he enjoyed it very much.  He also likes watching football and his favorite football team is in [facility].  He  also watches basketball and tries to play the piano.  He does also follow along with the [place] [facility].  His sister shares that he enjoys hanging out with friends, going to cookouts, and other social activities.  He laments that he cannot drive and cannot do much anymore.  Patient Values: Family  Goals: To try to continue chemotherapy if possible  Today's Discussion: In addition to discussion scraggly and discussion over his topics.  We spent a lot of time talking about his clinical picture.  We talked about how hard chemotherapy is.  We talked about how currently there is likely no  cure for his cancer.  We talked about different options and he shares that he has talked with Dr. Cloretta from oncology about treatment options.  He was told that he has to try to improve his kidneys prior to offering other options.  He notes that most of his surgery was done at [facility].  At this time he states that he is open to available and offered cancer treatments.  We spent time talking about his kidneys and how he only has 1 kidney which put him with minimal reserve.  Now because of AKI, likely due to dehydration from nausea/vomiting and poor intake after chemotherapy, his kidney has taken ahead.  We discussed nephrology, to see him and at this time they are not think he needs hemodialysis.  However, he is clear that he would not except hemodialysis.  We did spend time talk about CODE STATUS.  At this time he would like to remain full code.  He feels that DNR would be akin to giving up and he is not wanting to give up.  I shared today from perspective of DNR is more his body giving up and at some point we are all reached a state that we cannot fix our problems.  I encouraged him and his family to continue to talk about these things.  Finally, we talked about quality of life and how this is very important to consider when making treatment decisions.  We discussed his experience with chemotherapy and how much that is taken out of it.  I shared that everything in medicine that we can do to somebody has a cause usually through suffering her quality of life.  As long as he is comfortable pain that cost for more time, then we can continue to except those options if offered.  However, if at some point he is tired and does not want to pay the cost anymore and we would need to discuss a more comfort focused approach.  I briefly explained comfort care as excepting that we cannot fix things in a way that is desired and so we shift our focus to comfort, peace, dignity and excepting that end-of-life is  approaching.  I shared that palliative medicine will continue to follow and discuss goals of care and help him to evaluate treatment options and make decisions as options are offered.  I encouraged him and his family to continue to have discussions about these difficult questions.  I provided the resource book hard choices for loving people to assist in their conversations.  I shared that I would follow-up tomorrow. I provided emotional and general support through therapeutic listening, empathy, sharing of stories, and other techniques. I answered all questions and addressed all concerns to the best of my ability.  Review of Systems  Constitutional:        Denies pain in general  Respiratory:  Negative for  shortness of breath.   Gastrointestinal:  Negative for abdominal pain, nausea (Not at this time) and vomiting.    Objective:   Primary Diagnoses: Present on Admission:  AKI (acute kidney injury) (HCC)  Prostatic cancer (HCC)  Essential hypertension  Stage 3a chronic kidney disease (CKD) (HCC)  Chronic pulmonary embolism (HCC)  Pancytopenia (HCC)  Small bowel cancer (HCC)   Vital Signs:  BP 106/70 (BP Location: Left Arm)   Pulse 88   Temp 97.9 F (36.6 C) (Oral)   Resp 18   Wt 67.1 kg   SpO2 98%   BMI 24.62 kg/m   Physical Exam Vitals and nursing note reviewed.  Constitutional:      General: He is not in acute distress.    Appearance: He is ill-appearing.  HENT:     Head: Normocephalic and atraumatic.  Cardiovascular:     Rate and Rhythm: Normal rate.  Pulmonary:     Effort: Pulmonary effort is normal.  Abdominal:     General: Abdomen is flat.  Skin:    General: Skin is warm and dry.  Neurological:     General: No focal deficit present.     Mental Status: He is alert and oriented to person, place, and time.  Psychiatric:        Mood and Affect: Mood normal.        Behavior: Behavior normal.     Palliative Assessment/Data: 40%   Advanced Care  Planning:   Existing Vynca/ACP Documentation: None  Primary Decision Maker: PATIENT  Pertinent diagnosis: Intractable nausea and vomiting, AKI on CKD 4, metastatic prostate cancer, metastatic small bowel cancer, status post nephrectomy, pancytopenia/neutropenia  The patient and/or family consented to a voluntary Advance Care Planning Conversation in person. Individuals present for the conversation:  Summary of the conversation: We discussed current clinical situation.  We discussed offered options.  We discussed CODE STATUS.  Outcome of the conversations and/or documents completed: The patient wishes to remain full code for now, but will discuss further with family.  He is currently remaining open to offered and available treatments for cancer.  I spent 20 minutes providing separately identifiable ACP services with the patient and/or surrogate decision maker in a voluntary, in-person conversation discussing the patient's wishes and goals as detailed in the above note.  Assessment & Plan:   HPI/Patient Profile: 68 y.o. 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  Sent from the cancer center on the day of admission for sodium of 154 bicarb of 14 creatinine of 6.2, white blood cell count of 1.9 hemoglobin of 8 platelet count of 82 transferred to [facility]. He was admitted on 04/22/2024 with AKI on CKD stage IV, AGMA, hypernatremia, intractable nausea and vomiting, pancytopenia/neutropenia, metastatic small bowel cancer currently on chemo, metastatic  prostate cancer, and others.   Palliative medicine was consulted for GOC conversations.  SUMMARY OF RECOMMENDATIONS   Remain full code WOULD NOT accept HD if needed Full scope of care otherwise Specifically remains open to discussions on further chemotherapy Understands options may be limited by kidney function Continue conversations around goals and quality of  life Palliative medicine will continue to follow  Symptom Management:  Per primary team PMT is available to assist as needed  Code Status: Full code  Prognosis:  Unable to determine  Discharge Planning:  To Be Determined   Discussed with: Patient, family, medical team, nursing team    Thank you for allowing us  to participate in the care of Williams Dietrick PMT will continue to support holistically.  Billing based on MDM: High  Problems Addressed: One or more chronic illnesses with severe exacerbation, progression, or side effects of treatment.  Amount and/or Complexity of Data: Category 1:Review of prior external note(s) from each unique source, Review of the result(s) of each unique test, and Assessment requiring an independent historian(s) and Category 3:Discussion of management or test interpretation with external physician/other qualified health care professional/appropriate source (not separately reported)  Risks: N/A  Detailed review of medical records (labs, imaging, vital signs), medically appropriate exam, discussed with treatment team, counseling and education to patient, family, & staff, documenting clinical information, medication management, coordination of care  Signed by: Camellia Kays, NP Palliative Medicine Team  Team Phone # [phone] (Nights/Weekends)  04/24/2024, 4:14 PM

## 2024-04-24 NOTE — Progress Notes (Addendum)
 Brandon Peters is an 68 y.o. 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 Pt has CKD stage IV with baseline creatinine level around 2.4 and follows with a nephrologist at [facility].  He was admitted to [facility] 01/2024 for AKI on CKD when he was treated with IV fluid.  He was a started on chemotherapy FOLFOX on 04/09/2024 causing nausea, vomiting and unable to take food down.  He had lab done at the cancer center today which showed sodium of 154, potassium 3.8, CO2 14, BUN 108, creatinine 6.27, serum albumin 3.4, WBC 1.9, hemoglobin 8, platelet 82, started on D5 sodium bicarbonate  fluid, is not feeling well since starting chemotherapy.    Assessment/Plan: # Acute kidney injury on CKD stage IV in solitary kidney likely due to intravascular volume depletion/reduced renal perfusion caused by recent nausea vomiting, decreased oral intake due to chemotherapy.  Patient has only solitary kidney with poor renal reserve and history of obstructive uropathy. Kidney ultrasound LK no e/o obstruction.. PVR on 7/30  -UA suspicious for a UTI, urine sodium 33 and bladder scan showed 398 -> repeat with bladder scan for PVR (no foley currently), notified nursing staff.  -Stopped the sodium bicarbonate  fluid on 7/29, HCO3 25 + peripheral edema, likely third spacing.  Replete potassium as well.  -Strict ins and outs and daily lab. -Hopefully the kidney function will continue to improve (not worse but would like to see greater improvement); still possible the patient is in ATN; currently no absolute indications to start renal replacement therapy.  Also discussed with the patient and he really does not want dialysis at all.  Appreciate continued oncology or palliative care interaction to  address goals of care given metastatic malignancy.   # Hypernatremia with free water deficit of 4.3 L; will start D5W at 100 cc an hour and also encouraged him to drink water.  He is tolerating p.o.'s.:    # Anion gap metabolic acidosis: initially on hco3 -> off now   # UTI, many bacteria with 21-50 WBC's  # Pancytopenia due to recent chemotherapy, per primary and oncology team.  Subjective: Feeling better, no nausea anymore.  Denies any shortness of breath or chest pain.  Using the restroom often   Chemistry and CBC: Creatinine  Date/Time Value Ref Range Status  04/22/2024 12:15 PM 6.27 (H) 0.61 - 1.24 mg/dL Final  92/84/7974 88:64 AM 2.53 (H) 0.61 - 1.24 mg/dL Final  92/96/7974 98:57 PM 2.12 (H) 0.61 - 1.24 mg/dL Final  93/82/7974 89:78 AM 2.00 (H) 0.61 - 1.24 mg/dL Final  94/70/7974 88:76 AM 2.63 (H) 0.61 - 1.24 mg/dL Final  94/86/7974 88:93 AM 3.55 (H) 0.61 - 1.24 mg/dL Final  96/86/7974 91:87 AM 2.46 (H) 0.61 - 1.24 mg/dL Final  98/79/7974 89:92 AM 2.52 (H) 0.61 - 1.24 mg/dL Final  87/83/7975 89:85 AM 2.50 (H) 0.61 - 1.24 mg/dL Final  88/86/7975 89:92 AM 2.77 (H) 0.61 - 1.24 mg/dL Final  89/90/7975 98:94 PM 2.43 (H) 0.61 - 1.24 mg/dL Final  90/89/7975 90:66 AM 2.51 (H) 0.61 - 1.24 mg/dL Final  92/90/7975 90:70 AM 2.35 (H) 0.61 - 1.24 mg/dL Final  94/69/7975 91:47 AM 2.38 (H) 0.61 - 1.24 mg/dL Final  95/80/7975 89:97 AM 2.05 (H) 0.61 - 1.24 mg/dL Final  96/84/7975 89:86 AM 2.09 (H)  0.61 - 1.24 mg/dL Final  97/90/7975 89:94 AM 2.20 (H) 0.61 - 1.24 mg/dL Final  98/94/7975 91:57 AM 1.90 (H) 0.61 - 1.24 mg/dL Final  87/77/7976 92:41 AM 2.38 (H) 0.61 - 1.24 mg/dL Final  87/91/7976 89:81 AM 3.98 (HH) 0.61 - 1.24 mg/dL Final    Comment:    CRITICAL RESULT CALLED TO, READ BACK BY AND VERIFIED WITH: RBV CT KRISTY HELLAR @1056  09/02/22 VW   09/01/2022 12:56 PM 4.25 (HH) 0.61 - 1.24 mg/dL Final    Comment:    CRITICAL RESULT CALLED TO, READ BACK BY AND VERIFIED WITHBETHA GEOFM SALT,  RN 1346 [facility] 09/01/22    08/04/2022 09:03 AM 2.56 (H) 0.61 - 1.24 mg/dL Final  90/91/7976 91:44 AM 1.60 (H) 0.61 - 1.24 mg/dL Final  91/81/7976 92:44 AM 1.58 (H) 0.61 - 1.24 mg/dL Final  92/71/7976 90:89 AM 1.64 (H) 0.61 - 1.24 mg/dL Final  92/92/7976 91:43 AM 1.98 (H) 0.61 - 1.24 mg/dL Final  93/83/7976 91:46 AM 1.76 (H) 0.61 - 1.24 mg/dL Final  94/73/7976 91:74 AM 1.62 (H) 0.61 - 1.24 mg/dL Final  94/80/7976 91:97 AM 1.75 (H) 0.61 - 1.24 mg/dL Final  94/95/7976 88:57 AM 1.70 (H) 0.61 - 1.24 mg/dL Final  95/71/7976 90:57 AM 1.61 (H) 0.61 - 1.24 mg/dL Final  95/78/7976 90:50 AM 3.13 (HH) 0.61 - 1.24 mg/dL Final    Comment:    CRITICAL RESULT CALLED TO, READ BACK BY AND VERIFIED WITH: S COWARD,RN 1048 01/14/2022 DBRADLEY   12/24/2021 10:03 AM 1.72 (H) 0.61 - 1.24 mg/dL Final  96/89/7976 91:62 AM 1.57 (H) 0.61 - 1.24 mg/dL Final  97/82/7976 91:71 AM 1.60 (H) 0.61 - 1.24 mg/dL Final  97/89/7976 98:65 PM 1.55 (H) 0.61 - 1.24 mg/dL Final  98/72/7976 89:64 AM 2.37 (H) 0.61 - 1.24 mg/dL Final  98/80/7976 90:66 AM 1.73 (H) 0.61 - 1.24 mg/dL Final  98/94/7976 90:61 AM 1.83 (H) 0.61 - 1.24 mg/dL Final  87/84/7977 87:45 PM 1.74 (H) 0.61 - 1.24 mg/dL Final  87/98/7977 98:88 PM 1.72 (H) 0.61 - 1.24 mg/dL Final  88/77/7977 89:85 AM 1.73 (H) 0.61 - 1.24 mg/dL Final  88/89/7977 87:40 PM 1.62 (H) 0.61 - 1.24 mg/dL Final  89/75/7977 98:60 PM 1.46 (H) 0.61 - 1.24 mg/dL Final  89/89/7977 89:95 AM 1.92 (H) 0.61 - 1.24 mg/dL Final  93/89/7977 89:71 AM 1.20 0.61 - 1.24 mg/dL Final  97/88/7977 90:48 AM 1.13 0.61 - 1.24 mg/dL Final  89/88/7978 90:46 AM 1.02 0.61 - 1.24 mg/dL Final  93/89/7978 91:66 AM 1.02 0.61 - 1.24 mg/dL Final  97/89/7978 90:57 AM 0.96 0.61 - 1.24 mg/dL Final  89/69/7979 91:53 AM 0.92 0.61 - 1.24 mg/dL Final  91/92/7979 91:58 AM 0.84 0.61 - 1.24 mg/dL Final  88/86/7981 89:44 AM 0.8 0.7 - 1.3 mg/dL Final  89/98/7981 89:94 AM 0.8 0.7 - 1.3 mg/dL Final  91/78/7981 90:62 AM 0.8 0.7 - 1.3  mg/dL Final  92/89/7981 90:82 AM 0.8 0.7 - 1.3 mg/dL Final  94/70/7981 88:47 AM 0.8 0.7 - 1.3 mg/dL Final  95/96/7981 88:73 AM 0.7 0.7 - 1.3 mg/dL Final  97/93/7981 88:86 AM 0.8 0.7 - 1.3 mg/dL Final  87/94/7982 91:40 AM 0.8 0.7 - 1.3 mg/dL Final  88/92/7982 88:80 AM 0.8 0.7 - 1.3 mg/dL Final  89/89/7982 89:83 AM 0.8 0.7 - 1.3 mg/dL Final  90/73/7982 88:99 AM 0.7 0.7 - 1.3 mg/dL Final  90/87/7982 90:45 AM 0.8 0.7 - 1.3 mg/dL Final  91/70/7982 88:81 AM 0.7 0.7 - 1.3 mg/dL Final  Creat  Date/Time Value Ref Range Status  06/29/2011 08:52 AM 0.95 0.50 - 1.35 mg/dL Final    Comment:     Please note change in reference range(s).      Creatinine, Ser  Date/Time Value Ref Range Status  04/23/2024 09:15 AM 5.72 (H) 0.61 - 1.24 mg/dL Final  92/70/7974 92:42 AM 5.80 (H) 0.61 - 1.24 mg/dL Final  87/88/7976 89:52 AM 3.98 (H) 0.61 - 1.24 mg/dL Final  95/76/7976 93:97 AM 2.35 (H) 0.61 - 1.24 mg/dL Final  95/77/7976 94:56 AM 2.43 (H) 0.61 - 1.24 mg/dL Final   Recent Labs  Lab 04/22/24 1215 04/23/24 0757 04/23/24 0915 04/24/24 0313  NA 154* 150* 151* 155*  K 3.8 2.9* 2.8* 3.0*  CL 120* 116* 115* 121*  CO2 14* 24 25 24   GLUCOSE 233* 211* 246* 146*  BUN 108* 110* 106* 92*  CREATININE 6.27* 5.80* 5.72* 5.74*  CALCIUM  10.2 8.6* 8.5* 8.5*   Recent Labs  Lab 04/19/24 0800 04/22/24 1215 04/23/24 0915 04/24/24 0313  WBC 4.1 1.9* 1.0* 0.8*  NEUTROABS 3.3 1.1* 0.3* 0.1*  HGB 10.2* 8.0* 6.5* 8.3*  HCT 32.8* 26.1* 20.6* 26.6*  MCV 95.1 95.3 93.6 94.0  PLT 90* 82* 74* 81*   Liver Function Tests: Recent Labs  Lab 04/22/24 1215 04/23/24 0757  AST 29 24  ALT 27 19  ALKPHOS 108 64  BILITOT 0.4 0.5  PROT 6.6 5.1*  ALBUMIN 3.4* 2.1*   No results for input(s): LIPASE, AMYLASE in the last 168 hours. No results for input(s): AMMONIA in the last 168 hours. Cardiac Enzymes: No results for input(s): CKTOTAL, CKMB, CKMBINDEX, TROPONINI in the last 168 hours. Iron Studies:  No results for input(s): IRON, TIBC, TRANSFERRIN, FERRITIN in the last 72 hours. PT/INR: @LABRCNTIP (inr:5)  Xrays/Other Studies: ) Results for orders placed or performed during the hospital encounter of 04/22/24 (from the past 48 hours)  Glucose, capillary     Status: Abnormal   Collection Time: 04/22/24  8:25 PM  Result Value Ref Range   Glucose-Capillary 202 (H) 70 - 99 mg/dL    Comment: Glucose reference range applies only to samples taken after fasting for at least 8 hours.  Urinalysis, Routine w reflex microscopic -Urine, Clean Catch     Status: Abnormal   Collection Time: 04/23/24  1:49 AM  Result Value Ref Range   Color, Urine YELLOW YELLOW   APPearance CLOUDY (A) CLEAR   Specific Gravity, Urine 1.011 1.005 - 1.030   pH 5.0 5.0 - 8.0   Glucose, UA NEGATIVE NEGATIVE mg/dL   Hgb urine dipstick SMALL (A) NEGATIVE   Bilirubin Urine NEGATIVE NEGATIVE   Ketones, ur NEGATIVE NEGATIVE mg/dL   Protein, ur NEGATIVE NEGATIVE mg/dL   Nitrite NEGATIVE NEGATIVE   Leukocytes,Ua SMALL (A) NEGATIVE   RBC / HPF 0-5 0 - 5 RBC/hpf   WBC, UA 21-50 0 - 5 WBC/hpf   Bacteria, UA MANY (A) NONE SEEN   Squamous Epithelial / HPF 0-5 0 - 5 /HPF   Amorphous Crystal PRESENT     Comment: Performed at [facility], [address], [place], [place] [address]  Sodium, urine, random     Status: None   Collection Time: 04/23/24  1:49 AM  Result Value Ref Range   Sodium, Ur 33 mmol/L    Comment: Performed at [facility], [address], [place], [place] [address]  Osmolality, urine     Status: None   Collection Time: 04/23/24  1:49  AM  Result Value Ref Range   Osmolality, Ur 408 300 - 900 mOsm/kg    Comment: Performed at [facility], [address], [place], [place] [address]  Comprehensive metabolic panel     Status: Abnormal   Collection Time: 04/23/24  7:57 AM  Result Value Ref Range   Sodium 150 (H) 135 - 145 mmol/L   Potassium 2.9 (L) 3.5 - 5.1 mmol/L   Chloride 116  (H) 98 - 111 mmol/L   CO2 24 22 - 32 mmol/L   Glucose, Bld 211 (H) 70 - 99 mg/dL    Comment: Glucose reference range applies only to samples taken after fasting for at least 8 hours.   BUN 110 (H) 8 - 23 mg/dL   Creatinine, Ser 4.19 (H) 0.61 - 1.24 mg/dL   Calcium  8.6 (L) 8.9 - 10.3 mg/dL   Total Protein 5.1 (L) 6.5 - 8.1 g/dL   Albumin 2.1 (L) 3.5 - 5.0 g/dL   AST 24 15 - 41 U/L   ALT 19 0 - 44 U/L   Alkaline Phosphatase 64 38 - 126 U/L   Total Bilirubin 0.5 0.0 - 1.2 mg/dL   GFR, Estimated 10 (L) >60 mL/min    Comment: (NOTE) Calculated using the CKD-EPI Creatinine Equation (2021)    Anion gap 10 5 - 15    Comment: Performed at [facility], [address] [place], [place] [address]  Magnesium     Status: None   Collection Time: 04/23/24  7:57 AM  Result Value Ref Range   Magnesium 2.4 1.7 - 2.4 mg/dL    Comment: Performed at [facility], [address], [place], [place] [address]  Hemoglobin A1c     Status: Abnormal   Collection Time: 04/23/24  7:58 AM  Result Value Ref Range   Hgb A1c MFr Bld 7.2 (H) 4.8 - 5.6 %    Comment: (NOTE) Diagnosis of Diabetes The following HbA1c ranges recommended by the American Diabetes Association (ADA) may be used as an aid in the diagnosis of diabetes mellitus.  Hemoglobin             Suggested A1C NGSP%              Diagnosis  <5.7                   Non Diabetic  5.7-6.4                Pre-Diabetic  >6.4                   Diabetic  <7.0                   Glycemic control for                       adults with diabetes.     Mean Plasma Glucose 159.94 mg/dL    Comment: Performed at [facility], [address] [place], [place] [address]  CBC with Differential/Platelet     Status: Abnormal   Collection Time: 04/23/24  9:15 AM  Result Value Ref Range   WBC 1.0 (LL) 4.0 - 10.5 K/uL    Comment: REPEATED TO VERIFY WHITE COUNT CONFIRMED ON SMEAR THIS CRITICAL RESULT HAS VERIFIED AND BEEN CALLED TO AMBER LEONARD  RN. BY TAMEECO CALDWELL ON 07 29 2025 AT 1025, AND HAS BEEN READ BACK.     RBC  2.20 (L) 4.22 - 5.81 MIL/uL   Hemoglobin 6.5 (LL) 13.0 - 17.0 g/dL    Comment: REPEATED TO VERIFY THIS CRITICAL RESULT HAS VERIFIED AND BEEN CALLED TO AMBER LEONARD RN. BY TAMEECO CALDWELL ON 07 29 2025 AT 1025, AND HAS BEEN READ BACK.     HCT 20.6 (L) 39.0 - 52.0 %   MCV 93.6 80.0 - 100.0 fL   MCH 29.5 26.0 - 34.0 pg   MCHC 31.6 30.0 - 36.0 g/dL   RDW 84.7 88.4 - 84.4 %   Platelets 74 (L) 150 - 400 K/uL    Comment: Immature Platelet Fraction may be clinically indicated, consider ordering this additional test [id number] REPEATED TO VERIFY    nRBC 0.0 0.0 - 0.2 %   Neutrophils Relative % 33 %   Neutro Abs 0.3 (LL) 1.7 - 7.7 K/uL    Comment: REPEATED TO VERIFY THIS CRITICAL RESULT HAS VERIFIED AND BEEN CALLED TO AMBER LEONARD RN. BY TAMEECO CALDWELL ON 07 29 2025 AT 1059, AND HAS BEEN READ BACK.     Lymphocytes Relative 49 %   Lymphs Abs 0.5 (L) 0.7 - 4.0 K/uL   Monocytes Relative 15 %   Monocytes Absolute 0.1 0.1 - 1.0 K/uL   Eosinophils Relative 2 %   Eosinophils Absolute 0.0 0.0 - 0.5 K/uL   Basophils Relative 0 %   Basophils Absolute 0.0 0.0 - 0.1 K/uL   WBC Morphology MORPHOLOGY UNREMARKABLE    RBC Morphology See Note    Smear Review Normal platelet morphology    Immature Granulocytes 1 %   Abs Immature Granulocytes 0.01 0.00 - 0.07 K/uL   Tear Drop Cells PRESENT     Comment: Performed at [facility], [address], [place], [place] [address]  Basic metabolic panel with GFR     Status: Abnormal   Collection Time: 04/23/24  9:15 AM  Result Value Ref Range   Sodium 151 (H) 135 - 145 mmol/L   Potassium 2.8 (L) 3.5 - 5.1 mmol/L   Chloride 115 (H) 98 - 111 mmol/L   CO2 25 22 - 32 mmol/L   Glucose, Bld 246 (H) 70 - 99 mg/dL    Comment: Glucose reference range applies only to samples taken after fasting for at least 8 hours.   BUN 106 (H) 8 - 23 mg/dL   Creatinine, Ser 4.27 (H) 0.61 -  1.24 mg/dL   Calcium  8.5 (L) 8.9 - 10.3 mg/dL   GFR, Estimated 10 (L) >60 mL/min    Comment: (NOTE) Calculated using the CKD-EPI Creatinine Equation (2021)    Anion gap 11 5 - 15    Comment: Performed at [facility], [address] [place], [place] [address]  Glucose, capillary     Status: Abnormal   Collection Time: 04/23/24 11:24 AM  Result Value Ref Range   Glucose-Capillary 240 (H) 70 - 99 mg/dL    Comment: Glucose reference range applies only to samples taken after fasting for at least 8 hours.  Prepare RBC (crossmatch)     Status: None   Collection Time: 04/23/24 11:30 AM  Result Value Ref Range   Order Confirmation      ORDER PROCESSED BY BLOOD BANK Performed at [facility], [address] [place], [place] [address]   Type and screen [facility]     Status: None (Preliminary result)   Collection Time: 04/23/24 11:30 AM  Result Value Ref Range   ABO/RH(D) O POS  Antibody Screen NEG    Sample Expiration 04/26/2024,2359    Unit Number [id number]    Blood Component Type RED CELLS,LR    Unit division 00    Status of Unit ISSUED    Transfusion Status OK TO TRANSFUSE    Crossmatch Result      Compatible Performed at [facility], [address], [place], [place] [address]   Glucose, capillary     Status: Abnormal   Collection Time: 04/23/24  4:42 PM  Result Value Ref Range   Glucose-Capillary 170 (H) 70 - 99 mg/dL    Comment: Glucose reference range applies only to samples taken after fasting for at least 8 hours.  Glucose, capillary     Status: Abnormal   Collection Time: 04/23/24  9:34 PM  Result Value Ref Range   Glucose-Capillary 147 (H) 70 - 99 mg/dL    Comment: Glucose reference range applies only to samples taken after fasting for at least 8 hours.   Comment 1 Notify RN    Comment 2 Document in Chart   CBC with Differential/Platelet     Status: Abnormal   Collection Time: 04/24/24  3:13 AM  Result Value Ref Range    WBC 0.8 (LL) 4.0 - 10.5 K/uL    Comment: CRITICAL VALUE NOTED.  VALUE IS CONSISTENT WITH PREVIOUSLY REPORTED AND CALLED VALUE. REPEATED TO VERIFY    RBC 2.83 (L) 4.22 - 5.81 MIL/uL   Hemoglobin 8.3 (L) 13.0 - 17.0 g/dL   HCT 73.3 (L) 60.9 - 47.9 %   MCV 94.0 80.0 - 100.0 fL   MCH 29.3 26.0 - 34.0 pg   MCHC 31.2 30.0 - 36.0 g/dL   RDW 84.3 (H) 88.4 - 84.4 %   Platelets 81 (L) 150 - 400 K/uL    Comment: REPEATED TO VERIFY Immature Platelet Fraction may be clinically indicated, consider ordering this additional test [id number]    nRBC 2.7 (H) 0.0 - 0.2 %   Neutrophils Relative % 16 %   Neutro Abs 0.1 (LL) 1.7 - 7.7 K/uL    Comment: REPEATED TO VERIFY CRITICAL VALUE NOTED.  VALUE IS CONSISTENT WITH PREVIOUSLY REPORTED AND CALLED VALUE.    Lymphocytes Relative 60 %   Lymphs Abs 0.5 (L) 0.7 - 4.0 K/uL   Monocytes Relative 20 %   Monocytes Absolute 0.2 0.1 - 1.0 K/uL   Eosinophils Relative 3 %   Eosinophils Absolute 0.0 0.0 - 0.5 K/uL   Basophils Relative 0 %   Basophils Absolute 0.0 0.0 - 0.1 K/uL   WBC Morphology MORPHOLOGY UNREMARKABLE    RBC Morphology MORPHOLOGY UNREMARKABLE    Immature Granulocytes 1 %   Abs Immature Granulocytes 0.01 0.00 - 0.07 K/uL    Comment: Performed at [facility], [address] [place], [place] [address]  Basic metabolic panel with GFR     Status: Abnormal   Collection Time: 04/24/24  3:13 AM  Result Value Ref Range   Sodium 155 (H) 135 - 145 mmol/L   Potassium 3.0 (L) 3.5 - 5.1 mmol/L   Chloride 121 (H) 98 - 111 mmol/L   CO2 24 22 - 32 mmol/L   Glucose, Bld 146 (H) 70 - 99 mg/dL    Comment: Glucose reference range applies only to samples taken after fasting for at least 8 hours.   BUN 92 (H) 8 - 23 mg/dL   Creatinine, Ser 4.25 (H) 0.61 - 1.24 mg/dL   Calcium  8.5 (L) 8.9 - 10.3 mg/dL  GFR, Estimated 10 (L) >60 mL/min    Comment: (NOTE) Calculated using the CKD-EPI Creatinine Equation (2021)    Anion gap 10 5 - 15    Comment:  Performed at [facility], [address] [place], [place] [address]  Magnesium     Status: None   Collection Time: 04/24/24  3:13 AM  Result Value Ref Range   Magnesium 2.2 1.7 - 2.4 mg/dL    Comment: Performed at [facility], [address], [place], [place] [address]  Glucose, capillary     Status: Abnormal   Collection Time: 04/24/24  7:36 AM  Result Value Ref Range   Glucose-Capillary 131 (H) 70 - 99 mg/dL    Comment: Glucose reference range applies only to samples taken after fasting for at least 8 hours.   CT ABDOMEN PELVIS WO CONTRAST Result Date: 04/23/2024 CLINICAL DATA:  Abdominal pain, acute, nonlocalized EXAM: CT ABDOMEN AND PELVIS WITHOUT CONTRAST TECHNIQUE: Multidetector CT imaging of the abdomen and pelvis was performed following the standard protocol without IV contrast. RADIATION DOSE REDUCTION: This exam was performed according to the departmental dose-optimization program which includes automated exposure control, adjustment of the mA and/or kV according to patient size and/or use of iterative reconstruction technique. COMPARISON:  March 21, 2024 FINDINGS: Of note, the lack of intravenous contrast limits evaluation of the solid organ parenchyma and vascularity. Lower chest: Small bilateral pleural effusions with multifocal atelectasis in the lung bases. Mild cardiomegaly. Partially visualized chest port terminating in the right atrium. Hepatobiliary: No mass.No radiopaque stones or wall thickening of the gallbladder. No intrahepatic or extrahepatic biliary ductal dilation. Pancreas: No mass or main ductal dilation. No peripancreatic inflammation or fluid collection. Spleen: Normal size. No mass. Adrenals/Urinary Tract: No adrenal masses. Right nephrectomy. No left renal mass. No hydronephrosis or nephrolithiasis. The urinary bladder is distended without focal abnormality. Stomach/Bowel: The stomach is decompressed without focal abnormality. No small bowel wall thickening  or inflammation. No small bowel obstruction.Normal appendix. Vascular/Lymphatic: No aortic aneurysm. Diffuse aortoiliac atherosclerosis. Redemonstrated enlargement of the retroperitoneal lymphadenopathy, measuring 1.6 cm in the left periaortic region (previously, 1.4 cm). 1.2 cm mesenteric root lymph node is partially calcified, unchanged (axial 36). Reproductive: No prostatomegaly. Other: No pneumoperitoneum. Increasing intra-abdominal ascites, most notably in the perihepatic recesses, causing significant mass effect on the right hepatic lobe. A couple of small regions of nodularity noted in the fluid collection posteriorly (for example, axial 23), measuring 1.6 cm. Extensive omental soft tissue nodularity again noted. Musculoskeletal: No acute fracture or destructive lesion. Diffuse sclerotic metastases throughout the visualized skeleton again noted. IMPRESSION: 1. Increasing volume of malignant ascites, most notably within the perihepatic recesses causing mass effect on the right hepatic lobe. Diffuse peritoneal carcinomatosis is similar in appearance with a couple of small nodules developing in the perihepatic fluid. 2. Slight enlargement of 1 of the left periaortic lymph nodes measuring 1.6 cm in short axis dimension (previously, 1.4 cm). 3. Redemonstrated sclerotic bony metastases diffusely throughout the skeleton. 4. Small bilateral pleural effusions, increasing in size in the interim. Aortic Atherosclerosis (ICD10-I70.0). Electronically Signed   By: Rogelia Myers M.D.   On: 04/23/2024 15:01   US  RENAL Result Date: 04/22/2024 CLINICAL DATA:  Acute renal failure.  Status post right nephrectomy. EXAM: RENAL / URINARY TRACT ULTRASOUND COMPLETE COMPARISON:  CT 03/21/2024 FINDINGS: Right Kidney: Absent Left Kidney: Renal measurements: 10.3 x 5.8 x 5.0 cm = volume: 157 mL. Echogenicity within normal limits. No mass or hydronephrosis visualized. Bladder:  Appears normal for degree of bladder distention. Left  ureteral jet identified Other: Complex moderate perihepatic and perisplenic ascites is present with scalloping of the liver margin better seen on prior CT examination IMPRESSION: 1. Normal sonographic appearance of the LEFT kidney. 2. Status post RIGHT nephrectomy. 3. Complex moderate perihepatic and perisplenic ascites with scalloping of the liver margin better seen on prior CT examination. Electronically Signed   By: Dorethia Molt M.D.   On: 04/22/2024 22:35    PMH:   Past Medical History:  Diagnosis Date   ARF (acute renal failure) (HCC)    d/t lisinopril and/or diazide- was on both in a 2 week period- cr up to 4.99   Diabetes mellitus    Family history of lung cancer    Hyperlipidemia    Hypertension    Personal history of other malignant neoplasm of small intestine    RENAL CALCULUS, HX OF 06/26/2007    PSH:   Past Surgical History:  Procedure Laterality Date   COLONOSCOPY N/A 03/04/2016   Procedure: COLONOSCOPY;  Surgeon: Belvie Just, MD;  Location: [facility] ENDOSCOPY;  Service: Endoscopy;  Laterality: N/A;   ENTEROSCOPY N/A 03/04/2016   Procedure: ENTEROSCOPY;  Surgeon: Belvie Just, MD;  Location: [facility] ENDOSCOPY;  Service: Endoscopy;  Laterality: N/A;   IR IMAGING GUIDED PORT INSERTION  04/08/2024    Allergies:  Allergies  Allergen Reactions   Penicillins Other (See Comments)    Pt states that he cannot really remember reaction. Tentatively reports swelling/hives    Medications:   Prior to Admission medications   Medication Sig Start Date End Date Taking? Authorizing Provider  abiraterone  acetate (ZYTIGA ) 250 MG tablet TAKE 4 TABLETS BY MOUTH ONCE DAILY AS DIRECTED.  TAKE 1 HOUR BEFORE OR 2 HOURS AFTER A MEAL 11/23/23  Yes Cloretta Arley NOVAK, MD  amLODipine  (NORVASC ) 10 MG tablet Take 10 mg by mouth daily. 04/21/16  Yes [provider]  apixaban  (ELIQUIS ) 2.5 MG TABS tablet Take 1 tablet by mouth twice daily 04/04/24  Yes Cloretta Arley NOVAK, MD  docusate sodium  (COLACE) 100 MG  capsule Take 100 mg by mouth daily as needed for mild constipation.   Yes [provider]  ferrous sulfate  325 (65 FE) MG EC tablet Take 1 tablet (325 mg total) by mouth 3 (three) times daily with meals. 11/06/19  Yes Cloretta Arley NOVAK, MD  lidocaine -prilocaine  (EMLA ) cream Apply 1 Application topically as needed (Apply to port 1-2 hours before its use starting 2 weeks after it is placed). 04/01/24  Yes Cloretta Arley NOVAK, MD  linagliptin (TRADJENTA) 5 MG TABS tablet Take 1 tablet by mouth daily. 02/17/24 04/22/24 Yes [provider]  loperamide  (IMODIUM ) 2 MG capsule Take 2 mg by mouth daily as needed for diarrhea or loose stools.   Yes [provider]  ondansetron  (ZOFRAN ) 8 MG tablet Take 1 tablet (8 mg total) by mouth every 8 (eight) hours as needed (may use 3 days after chemo as needed for nausea). 04/01/24  Yes Cloretta Arley NOVAK, MD  predniSONE  (DELTASONE ) 5 MG tablet Take 1 tablet by mouth once daily with breakfast 03/11/24  Yes Cloretta Arley NOVAK, MD  prochlorperazine  (COMPAZINE ) 10 MG tablet Take 1 tablet (10 mg total) by mouth every 6 (six) hours as needed for nausea or vomiting. 04/01/24  Yes Cloretta Arley NOVAK, MD  rosuvastatin  (CRESTOR ) 10 MG tablet Take 10 mg by mouth every morning. 02/23/21  Yes [provider]  tamsulosin  (FLOMAX ) 0.4 MG CAPS capsule Take 0.4  mg by mouth at bedtime. 05/02/16  Yes [provider]    Discontinued Meds:   Medications Discontinued During This Encounter  Medication Reason   torsemide (DEMADEX) 10 MG tablet    empagliflozin (JARDIANCE) 10 MG TABS tablet Discontinued by provider   sodium bicarbonate  150 mEq in dextrose  5 % 1,150 mL infusion     Social History:  reports that he has never smoked. He has never used smokeless tobacco. He reports that he does not drink alcohol and does not use drugs.  Family History:   Family History  Problem Relation Age of Onset   Cancer Mother    Diabetes Mother    Hypertension Mother     Coronary artery disease Mother    Hyperlipidemia Sister    Hypertension Sister     Blood pressure 106/70, pulse 88, temperature 97.9 F (36.6 C), temperature source Oral, resp. rate 18, weight 67.1 kg, SpO2 98%. Physical Exam: General exam: Appears comfortable  Respiratory system: CTA b/l, normal rate Cardiovascular system: S1 & S2 heard, RRR.  Bilateral pedal edema+. Gastrointestinal system: Abdomen SNDNT+BS Central nervous system: Alert and oriented. No focal neurological deficits. Extremities: Bilateral lower extremities pitting edema present, no cyanosis. Skin: No rashes, lesions or ulcers Psychiatry: Judgement and insight appear normal. Mood & affect appropriate.      MELIA LYNWOOD ORN, MD 04/24/2024, 11:02 AM

## 2024-04-24 NOTE — Plan of Care (Signed)
   Problem: Education: Goal: Ability to describe self-care measures that may prevent or decrease complications (Diabetes Survival Skills Education) will improve Outcome: Completed/Met

## 2024-04-24 NOTE — TOC CM/SW Note (Signed)
 Transition of Care [facility]) - Inpatient Brief Assessment   Patient Details  Name: Brandon Peters MRN: [id number] Date of Birth: 09/04/1956  Transition of Care [facility]) CM/SW Contact:    Tom-Johnson, Harvest Muskrat, RN Phone Number: 04/24/2024, 11:12 AM   Clinical Narrative:  Patient presented to the Hospital from the Cancer [facility] as direct admission for worsening N/V,  difficulty Swallowing and severe Renal Failure. Nephrology following.   Patient's labs at the [facility] showed Sodium of 154, Bicarb of 14, Creatinine of 6.27 (Creatinine on 04/09/2024 was 2.53) WBC of 1.9, Hemoglobin of 8 and Platelets of 82.   Patient has hx of Metastatic Prostate Cancer, Renal Cell Carcinoma,  CKD stage IV, PE on Eliquis , Metastatic Small Bowel Carcinoma, PE on Eliquis , HTN, HLD, T2DM.  Patient is currently on Chemotherapy. Oncology following. Hgb yesterday 04/23/24 was 6.5, received 1U PRBC, today hgb at 8.3. On IV abx.   From home alone, does not have children. Has three siblings, sister Montie at bedside. Retired, modified independent with care prior to admit. Has a cane and walker at home. Wheelchair and RW recommended by PT, patient states he has a RW at home. Wheelchair ordered from [employer] and [employer] to deliver to patient at beside.  PCP is Horton, Lovie, NP and uses Psychologist, forensic on [employer].   Patient not Medically ready for discharge.  CM will continue to follow as patient progresses with care towards discharge.              Transition of Care Asessment: Insurance and Status: Insurance coverage has been reviewed Patient has primary care physician: Yes Home environment has been reviewed: Yes Prior level of function:: Modified Independent Prior/Current Home Services: No current home services Social Drivers of Health Review: SDOH reviewed no interventions necessary Readmission risk has been reviewed: Yes Transition of care needs: transition of care needs identified, TOC  will continue to follow

## 2024-04-25 ENCOUNTER — Other Ambulatory Visit: Payer: Self-pay

## 2024-04-25 ENCOUNTER — Encounter (HOSPITAL_COMMUNITY): Payer: Self-pay | Admitting: Internal Medicine

## 2024-04-25 ENCOUNTER — Inpatient Hospital Stay

## 2024-04-25 DIAGNOSIS — Z66 Do not resuscitate: Secondary | ICD-10-CM | POA: Diagnosis not present

## 2024-04-25 DIAGNOSIS — Z515 Encounter for palliative care: Secondary | ICD-10-CM | POA: Diagnosis not present

## 2024-04-25 DIAGNOSIS — N179 Acute kidney failure, unspecified: Secondary | ICD-10-CM | POA: Diagnosis not present

## 2024-04-25 DIAGNOSIS — Z789 Other specified health status: Secondary | ICD-10-CM

## 2024-04-25 DIAGNOSIS — Z7189 Other specified counseling: Secondary | ICD-10-CM | POA: Diagnosis not present

## 2024-04-25 LAB — CBC WITH DIFFERENTIAL/PLATELET
Abs Immature Granulocytes: 0 K/uL (ref 0.00–0.07)
Basophils Absolute: 0 K/uL (ref 0.0–0.1)
Basophils Relative: 0 %
Eosinophils Absolute: 0 K/uL (ref 0.0–0.5)
Eosinophils Relative: 1 %
HCT: 26 % — ABNORMAL LOW (ref 39.0–52.0)
Hemoglobin: 7.9 g/dL — ABNORMAL LOW (ref 13.0–17.0)
Immature Granulocytes: 0 %
Lymphocytes Relative: 41 %
Lymphs Abs: 0.6 K/uL — ABNORMAL LOW (ref 0.7–4.0)
MCH: 29.2 pg (ref 26.0–34.0)
MCHC: 30.4 g/dL (ref 30.0–36.0)
MCV: 95.9 fL (ref 80.0–100.0)
Monocytes Absolute: 0.2 K/uL (ref 0.1–1.0)
Monocytes Relative: 14 %
Neutro Abs: 0.6 K/uL — ABNORMAL LOW (ref 1.7–7.7)
Neutrophils Relative %: 44 %
Platelets: 95 K/uL — ABNORMAL LOW (ref 150–400)
RBC: 2.71 MIL/uL — ABNORMAL LOW (ref 4.22–5.81)
RDW: 16.2 % — ABNORMAL HIGH (ref 11.5–15.5)
WBC: 1.4 K/uL — CL (ref 4.0–10.5)
nRBC: 0 % (ref 0.0–0.2)

## 2024-04-25 LAB — RENAL FUNCTION PANEL
Albumin: 1.9 g/dL — ABNORMAL LOW (ref 3.5–5.0)
Anion gap: 10 (ref 5–15)
BUN: 80 mg/dL — ABNORMAL HIGH (ref 8–23)
CO2: 24 mmol/L (ref 22–32)
Calcium: 8.2 mg/dL — ABNORMAL LOW (ref 8.9–10.3)
Chloride: 116 mmol/L — ABNORMAL HIGH (ref 98–111)
Creatinine, Ser: 5.39 mg/dL — ABNORMAL HIGH (ref 0.61–1.24)
GFR, Estimated: 11 mL/min — ABNORMAL LOW (ref >60)
Glucose, Bld: 311 mg/dL — ABNORMAL HIGH (ref 70–99)
Phosphorus: 3.6 mg/dL (ref 2.5–4.6)
Potassium: 3.1 mmol/L — ABNORMAL LOW (ref 3.5–5.1)
Sodium: 150 mmol/L — ABNORMAL HIGH (ref 135–145)

## 2024-04-25 LAB — GLUCOSE, CAPILLARY
Glucose-Capillary: 294 mg/dL — ABNORMAL HIGH (ref 70–99)
Glucose-Capillary: 265 mg/dL — ABNORMAL HIGH (ref 70–99)
Glucose-Capillary: 118 mg/dL — ABNORMAL HIGH (ref 70–99)

## 2024-04-25 MED ORDER — INSULIN ASPART 100 UNIT/ML IJ SOLN
2.0000 [IU] | Freq: Three times a day (TID) | INTRAMUSCULAR | Status: DC
  Administered 2024-04-25: 2 [IU] via SUBCUTANEOUS

## 2024-04-25 MED ORDER — ORAL CARE MOUTH RINSE: 15.0000 mL | OROMUCOSAL | Status: DC | PRN

## 2024-04-25 MED ORDER — INSULIN ASPART 100 UNIT/ML IJ SOLN
0.0000 [IU] | Freq: Three times a day (TID) | INTRAMUSCULAR | Status: DC
  Administered 2024-04-25: 5 [IU] via SUBCUTANEOUS
  Administered 2024-04-26: 1 [IU] via SUBCUTANEOUS

## 2024-04-25 MED ORDER — INSULIN ASPART 100 UNIT/ML IJ SOLN: 0.0000 [IU] | Freq: Every day | INTRAMUSCULAR | Status: DC

## 2024-04-25 MED ORDER — MELATONIN 3 MG PO TABS
3.0000 mg | ORAL_TABLET | Freq: Every day | ORAL | Status: DC
  Filled 2024-04-25 (×2): qty 1

## 2024-04-25 MED ORDER — INSULIN GLARGINE-YFGN 100 UNIT/ML ~~LOC~~ SOLN
5.0000 [IU] | Freq: Two times a day (BID) | SUBCUTANEOUS | Status: DC
  Administered 2024-04-25 – 2024-04-26 (×3): 5 [IU] via SUBCUTANEOUS
  Filled 2024-04-25 (×4): qty 0.05

## 2024-04-25 NOTE — Plan of Care (Signed)
  Problem: Tissue Perfusion: Goal: Adequacy of tissue perfusion will improve Outcome: Progressing   Problem: Education: Goal: Ability to describe self-care measures that may prevent or decrease complications (Diabetes Survival Skills Education) will improve Outcome: Progressing Goal: Individualized Educational Video(s) Outcome: Progressing   Problem: Coping: Goal: Ability to adjust to condition or change in health will improve Outcome: Progressing   Problem: Fluid Volume: Goal: Ability to maintain a balanced intake and output will improve Outcome: Progressing   Problem: Health Behavior/Discharge Planning: Goal: Ability to identify and utilize available resources and services will improve Outcome: Progressing Goal: Ability to manage health-related needs will improve Outcome: Progressing   Problem: Metabolic: Goal: Ability to maintain appropriate glucose levels will improve Outcome: Progressing   Problem: Nutritional: Goal: Maintenance of adequate nutrition will improve Outcome: Progressing Goal: Progress toward achieving an optimal weight will improve Outcome: Progressing   Problem: Skin Integrity: Goal: Risk for impaired skin integrity will decrease Outcome: Progressing   Problem: Tissue Perfusion: Goal: Adequacy of tissue perfusion will improve Outcome: Progressing   Problem: Education: Goal: Knowledge of General Education information will improve Description: Including pain rating scale, medication(s)/side effects and non-pharmacologic comfort measures Outcome: Progressing   Problem: Health Behavior/Discharge Planning: Goal: Ability to manage health-related needs will improve Outcome: Progressing   Problem: Clinical Measurements: Goal: Ability to maintain clinical measurements within normal limits will improve Outcome: Progressing Goal: Will remain free from infection Outcome: Progressing Goal: Diagnostic test results will improve Outcome: Progressing Goal:  Respiratory complications will improve Outcome: Progressing Goal: Cardiovascular complication will be avoided Outcome: Progressing   Problem: Activity: Goal: Risk for activity intolerance will decrease Outcome: Progressing   Problem: Nutrition: Goal: Adequate nutrition will be maintained Outcome: Progressing   Problem: Coping: Goal: Level of anxiety will decrease Outcome: Progressing   Problem: Elimination: Goal: Will not experience complications related to bowel motility Outcome: Progressing Goal: Will not experience complications related to urinary retention Outcome: Progressing   Problem: Pain Managment: Goal: General experience of comfort will improve and/or be controlled Outcome: Progressing   Problem: Safety: Goal: Ability to remain free from injury will improve Outcome: Progressing   Problem: Skin Integrity: Goal: Risk for impaired skin integrity will decrease Outcome: Progressing

## 2024-04-25 NOTE — Progress Notes (Signed)
  Daily Progress Note   Date: 04/25/2024   Patient Name: Brandon Peters  DOB: [birth year]  MRN: [id number]  Age / Sex: 68 y.o., male  Attending Physician: Odell Castor, Erle, MD Primary Care Physician: Bari Morgans, NP Admit Date: 04/22/2024 Length of Stay: 3 days  Reason for Follow-up: {Reason for Consult:23484}  Past Medical History:  Diagnosis Date   ARF (acute renal failure) (HCC)    d/t lisinopril and/or diazide- was on both in a 2 week period- cr up to 4.99   Diabetes mellitus    Family history of lung cancer    Hyperlipidemia    Hypertension    Personal history of other malignant neoplasm of small intestine    RENAL CALCULUS, HX OF 06/26/2007    Subjective:   Subjective: Chart Reviewed. Updates received. Patient Assessed. Created space and opportunity for patient  and family to explore thoughts and feelings regarding current medical situation.  Today's Discussion: Today before meeting with the patient/family, I reviewed the chart including ***. ***  Review of Systems  Objective:   Primary Diagnoses: Present on Admission:  AKI (acute kidney injury) (HCC)  Prostatic cancer (HCC)  Essential hypertension  Stage 3a chronic kidney disease (CKD) (HCC)  Chronic pulmonary embolism (HCC)  Pancytopenia (HCC)  Small bowel cancer (HCC)   Vital Signs:  BP 98/66 (BP Location: Right Arm)   Pulse 95   Temp 97.6 F (36.4 C) (Oral)   Resp 18   Wt 67.4 kg   SpO2 100%   BMI 24.73 kg/m   Physical Exam  Palliative Assessment/Data: ***   Existing Vynca/ACP Documentation: ***  Advanced Care Planning:   Existing Vynca/ACP Documentation: ***  Primary Decision Maker: {Primary Decision Fjxzm:78612}  Pertinent diagnosis: ***  The patient and/or family consented to a voluntary Advance Care Planning Conversation in person/over the phone***. Individuals present for the conversation:  Summary of the conversation: ***  Outcome of the conversations and/or documents  completed: ***  I spent *** minutes providing separately identifiable ACP services with the patient and/or surrogate decision maker in a voluntary, in-person conversation discussing the patient's wishes and goals as detailed in the above note.  Assessment & Plan:   HPI/Patient Profile:  ***  SUMMARY OF RECOMMENDATIONS   ***  Symptom Management:  ***  Code Status: {Palliative Code status:23503}  Prognosis: {Palliative Care Prognosis:23504}  Discharge Planning: {Palliative dispostion:23505}  Discussed with: ***  Thank you for allowing us  to participate in the care of Brandon Peters PMT will continue to support holistically.  Time Total: ***  Detailed review of medical records (labs, imaging, vital signs), medically appropriate exam, discussed with treatment team, counseling and education to patient, family, & staff, documenting clinical information, medication management, coordination of care  Camellia Kays, NP Palliative Medicine Team  Team Phone # [phone] (Nights/Weekends)  05/25/2021, 8:17 AM

## 2024-04-25 NOTE — Progress Notes (Addendum)
 Physical Therapy Treatment Patient Details Name: Brandon Peters MRN: [id number] DOB: [birth year] Today's Date: 04/25/2024   History of Present Illness Pt is a 68 y/o male admitted from cancer center secondary to nausea and vomiting. Work-up in progress. PMH including but not limited to CKD stage IV (baseline of around 2.5), metastatic prostate cancer, renal cell carcinoma status post right nephrectomy in October 2023, metastatic small bowel carcinoma currently on chemotherapy with cycle 1 of FOLFOX on 04/09/2024, anemia of chronic disease, PE on Eliquis , hypertension, hyperlipidemia, diabetes mellitus type 2, admission at [facility] in 01/2024 for AKI on CKD stage IV treated with IV fluids    PT Comments  Pt progressing steadily towards his physical therapy goals and is agreeable to participate. Initiated session with seated exercises for BLE strengthening. Pt requiring min-mod assist to transition to standing and ambulating 50 ft with a walker and CGA. Worked on serial sit to stands for functional LE strengthening and power. HHPT remains appropriate.    If plan is discharge home, recommend the following: A little help with bathing/dressing/bathroom;A little help with walking and/or transfers;Assistance with cooking/housework;Help with stairs or ramp for entrance   Can travel by private vehicle        Equipment Recommendations  Wheelchair (measurements PT);Wheelchair cushion (measurements PT);Rolling walker (2 wheels)    Recommendations for Other Services       Precautions / Restrictions Precautions Precautions: Fall Recall of Precautions/Restrictions: Intact Precaution/Restrictions Comments: Implanted port Restrictions Weight Bearing Restrictions Per Provider Order: No     Mobility  Bed Mobility               General bed mobility comments: OOB in chair    Transfers Overall transfer level: Needs assistance Equipment used: Rolling walker (2 wheels) Transfers: Sit to/from Stand Sit  to Stand: Min assist, Mod assist           General transfer comment: Min-modA from recliner, cues for pushing up from armrests, use of momentum and nose over toes    Ambulation/Gait Ambulation/Gait assistance: Contact guard assist Gait Distance (Feet): 50 Feet Assistive device: Rolling walker (2 wheels) Gait Pattern/deviations: Step-through pattern, Decreased stride length Gait velocity: decreased Gait velocity interpretation: <1.8 ft/sec, indicate of risk for recurrent falls   General Gait Details: Increased lateral sway, CGA for safety, cues for monitoring for activity tolerance   Stairs             Wheelchair Mobility     Tilt Bed    Modified Rankin (Stroke Patients Only)       Balance Overall balance assessment: Needs assistance Sitting-balance support: No upper extremity supported, Feet supported Sitting balance-Leahy Scale: Fair     Standing balance support: Bilateral upper extremity supported, During functional activity, Reliant on assistive device for balance Standing balance-Leahy Scale: Poor Standing balance comment: reliant on RW for support                            Communication Communication Communication: No apparent difficulties  Cognition Arousal: Alert Behavior During Therapy: WFL for tasks assessed/performed   PT - Cognitive impairments: No apparent impairments                         Following commands: Intact      Cueing Cueing Techniques: Verbal cues  Exercises General Exercises - Lower Extremity Long Arc Quad: AROM, Both, 10 reps, Seated Hip ABduction/ADduction: AROM, Both, 10 reps, Seated Hip  Flexion/Marching: AROM, Both, 10 reps, Seated Other Exercises Other Exercises: x3 serial sit to stands    General Comments        Pertinent Vitals/Pain Pain Assessment Pain Assessment: No/denies pain    Home Living                          Prior Function            PT Goals (current  goals can now be found in the care plan section) Acute Rehab PT Goals Patient Stated Goal: to feel better Potential to Achieve Goals: Good Progress towards PT goals: Progressing toward goals    Frequency    Min 2X/week      PT Plan      Co-evaluation              AM-PAC PT 6 Clicks Mobility   Outcome Measure  Help needed turning from your back to your side while in a flat bed without using bedrails?: A Little Help needed moving from lying on your back to sitting on the side of a flat bed without using bedrails?: A Little Help needed moving to and from a bed to a chair (including a wheelchair)?: A Little Help needed standing up from a chair using your arms (e.g., wheelchair or bedside chair)?: A Lot Help needed to walk in hospital room?: A Little Help needed climbing 3-5 steps with a railing? : A Lot 6 Click Score: 16    End of Session Equipment Utilized During Treatment: Gait belt Activity Tolerance: Patient tolerated treatment well Patient left: in chair;with call bell/phone within reach Nurse Communication: Mobility status PT Visit Diagnosis: Other abnormalities of gait and mobility (R26.89)     Time: 9170-9150 PT Time Calculation (min) (ACUTE ONLY): 20 min  Charges:    $Therapeutic Activity: 8-22 mins PT General Charges $$ ACUTE PT VISIT: 1 Visit                     Aleck Daring, PT, DPT Acute Rehabilitation Services Office [phone]    [name] 04/25/2024, 9:00 AM

## 2024-04-25 NOTE — Progress Notes (Signed)
 Brandon Peters is an 68 y.o. 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 Pt has CKD stage IV with baseline creatinine level around 2.4 and follows with a nephrologist at [facility].  He was admitted to [facility] 01/2024 for AKI on CKD when he was treated with IV fluid.  He was a started on chemotherapy FOLFOX on 04/09/2024 causing nausea, vomiting and unable to take food down.  He had lab done at the cancer center today which showed sodium of 154, potassium 3.8, CO2 14, BUN 108, creatinine 6.27, serum albumin 3.4, WBC 1.9, hemoglobin 8, platelet 82, started on D5 sodium bicarbonate  fluid, is not feeling well since starting chemotherapy.    Assessment/Plan: # Acute kidney injury on CKD stage IV in solitary kidney likely due to intravascular volume depletion/reduced renal perfusion caused by recent nausea vomiting, decreased oral intake due to chemotherapy.  Patient has only solitary kidney with poor renal reserve and history of obstructive uropathy. Kidney ultrasound LK no e/o obstruction.. PVR on 7/30  -UA suspicious for a UTI, urine sodium 33   -Stopped the sodium bicarbonate  fluid on 7/29, HCO3 25 + peripheral edema, likely third spacing.  Replete potassium as well.  -Strict ins and outs and daily lab. -Fortunately kidney function continues to slowly improve.  I spent a lot of time with the spouse and patient on 7/30 discussing dialysis the patient was adamant about not wanting dialysis at all c/w palliative discussions to establish GOC.   Appreciate continued oncology or palliative care interaction to address goals of care given metastatic malignancy.   # Hypernatremia with free water deficit of 2.6 L; D5W started at 100 cc an hour on 7/30 and also encouraged him to  drink water.  Continue D5W for another 24hrs.  He is tolerating p.o.'s.:    # Anion gap metabolic acidosis: initially on hco3 -> off now   # UTI, many bacteria with 21-50 WBC's  # Pancytopenia due to recent chemotherapy, per primary and oncology team.  Subjective: Feeling better, no nausea anymore.  Denies any shortness of breath or chest pain.  Using the restroom often.  Spouse bedside updated   Chemistry and CBC: Creatinine  Date/Time Value Ref Range Status  04/22/2024 12:15 PM 6.27 (H) 0.61 - 1.24 mg/dL Final  92/84/7974 88:64 AM 2.53 (H) 0.61 - 1.24 mg/dL Final  92/96/7974 98:57 PM 2.12 (H) 0.61 - 1.24 mg/dL Final  93/82/7974 89:78 AM 2.00 (H) 0.61 - 1.24 mg/dL Final  94/70/7974 88:76 AM 2.63 (H) 0.61 - 1.24 mg/dL Final  94/86/7974 88:93 AM 3.55 (H) 0.61 - 1.24 mg/dL Final  96/86/7974 91:87 AM 2.46 (H) 0.61 - 1.24 mg/dL Final  98/79/7974 89:92 AM 2.52 (H) 0.61 - 1.24 mg/dL Final  87/83/7975 89:85 AM 2.50 (H) 0.61 - 1.24 mg/dL Final  88/86/7975 89:92 AM 2.77 (H) 0.61 - 1.24 mg/dL Final  89/90/7975 98:94 PM 2.43 (H) 0.61 - 1.24 mg/dL Final  90/89/7975 90:66 AM 2.51 (H) 0.61 - 1.24 mg/dL Final  92/90/7975 90:70 AM 2.35 (H) 0.61 - 1.24 mg/dL Final  94/69/7975 91:47 AM 2.38 (H) 0.61 - 1.24 mg/dL Final  95/80/7975 89:97 AM 2.05 (H) 0.61 - 1.24 mg/dL Final  96/84/7975 89:86 AM 2.09 (H) 0.61 - 1.24 mg/dL Final  97/90/7975 89:94 AM 2.20 (H) 0.61 - 1.24  mg/dL Final  98/94/7975 91:57 AM 1.90 (H) 0.61 - 1.24 mg/dL Final  87/77/7976 92:41 AM 2.38 (H) 0.61 - 1.24 mg/dL Final  87/91/7976 89:81 AM 3.98 (HH) 0.61 - 1.24 mg/dL Final    Comment:    CRITICAL RESULT CALLED TO, READ BACK BY AND VERIFIED WITH: RBV CT KRISTY HELLAR @1056  09/02/22 VW   09/01/2022 12:56 PM 4.25 (HH) 0.61 - 1.24 mg/dL Final    Comment:    CRITICAL RESULT CALLED TO, READ BACK BY AND VERIFIED WITHBETHA GEOFM SALT, RN [address] [facility] 09/01/22    08/04/2022 09:03 AM 2.56 (H) 0.61 - 1.24 mg/dL Final  90/91/7976 91:44 AM 1.60  (H) 0.61 - 1.24 mg/dL Final  91/81/7976 92:44 AM 1.58 (H) 0.61 - 1.24 mg/dL Final  92/71/7976 90:89 AM 1.64 (H) 0.61 - 1.24 mg/dL Final  92/92/7976 91:43 AM 1.98 (H) 0.61 - 1.24 mg/dL Final  93/83/7976 91:46 AM 1.76 (H) 0.61 - 1.24 mg/dL Final  94/73/7976 91:74 AM 1.62 (H) 0.61 - 1.24 mg/dL Final  94/80/7976 91:97 AM 1.75 (H) 0.61 - 1.24 mg/dL Final  94/95/7976 88:57 AM 1.70 (H) 0.61 - 1.24 mg/dL Final  95/71/7976 90:57 AM 1.61 (H) 0.61 - 1.24 mg/dL Final  95/78/7976 90:50 AM 3.13 (HH) 0.61 - 1.24 mg/dL Final    Comment:    CRITICAL RESULT CALLED TO, READ BACK BY AND VERIFIED WITH: S COWARD,RN 1048 01/14/2022 DBRADLEY   12/24/2021 10:03 AM 1.72 (H) 0.61 - 1.24 mg/dL Final  96/89/7976 91:62 AM 1.57 (H) 0.61 - 1.24 mg/dL Final  97/82/7976 91:71 AM 1.60 (H) 0.61 - 1.24 mg/dL Final  97/89/7976 98:65 PM 1.55 (H) 0.61 - 1.24 mg/dL Final  98/72/7976 89:64 AM 2.37 (H) 0.61 - 1.24 mg/dL Final  98/80/7976 90:66 AM 1.73 (H) 0.61 - 1.24 mg/dL Final  98/94/7976 90:61 AM 1.83 (H) 0.61 - 1.24 mg/dL Final  87/84/7977 87:45 PM 1.74 (H) 0.61 - 1.24 mg/dL Final  87/98/7977 98:88 PM 1.72 (H) 0.61 - 1.24 mg/dL Final  88/77/7977 89:85 AM 1.73 (H) 0.61 - 1.24 mg/dL Final  88/89/7977 87:40 PM 1.62 (H) 0.61 - 1.24 mg/dL Final  89/75/7977 98:60 PM 1.46 (H) 0.61 - 1.24 mg/dL Final  89/89/7977 89:95 AM 1.92 (H) 0.61 - 1.24 mg/dL Final  93/89/7977 89:71 AM 1.20 0.61 - 1.24 mg/dL Final  97/88/7977 90:48 AM 1.13 0.61 - 1.24 mg/dL Final  89/88/7978 90:46 AM 1.02 0.61 - 1.24 mg/dL Final  93/89/7978 91:66 AM 1.02 0.61 - 1.24 mg/dL Final  97/89/7978 90:57 AM 0.96 0.61 - 1.24 mg/dL Final  89/69/7979 91:53 AM 0.92 0.61 - 1.24 mg/dL Final  91/92/7979 91:58 AM 0.84 0.61 - 1.24 mg/dL Final  88/86/7981 89:44 AM 0.8 0.7 - 1.3 mg/dL Final  89/98/7981 89:94 AM 0.8 0.7 - 1.3 mg/dL Final  91/78/7981 90:62 AM 0.8 0.7 - 1.3 mg/dL Final  92/89/7981 90:82 AM 0.8 0.7 - 1.3 mg/dL Final  94/70/7981 88:47 AM 0.8 0.7 - 1.3 mg/dL  Final  95/96/7981 88:73 AM 0.7 0.7 - 1.3 mg/dL Final  97/93/7981 88:86 AM 0.8 0.7 - 1.3 mg/dL Final  87/94/7982 91:40 AM 0.8 0.7 - 1.3 mg/dL Final  88/92/7982 88:80 AM 0.8 0.7 - 1.3 mg/dL Final  89/89/7982 89:83 AM 0.8 0.7 - 1.3 mg/dL Final  90/73/7982 88:99 AM 0.7 0.7 - 1.3 mg/dL Final  90/87/7982 90:45 AM 0.8 0.7 - 1.3 mg/dL Final  91/70/7982 88:81 AM 0.7 0.7 - 1.3 mg/dL Final   Creat  Date/Time Value Ref Range Status  06/29/2011 08:52 AM 0.95 0.50 -  1.35 mg/dL Final    Comment:     Please note change in reference range(s).      Creatinine, Ser  Date/Time Value Ref Range Status  04/23/2024 09:15 AM 5.72 (H) 0.61 - 1.24 mg/dL Final  92/70/7974 92:42 AM 5.80 (H) 0.61 - 1.24 mg/dL Final  87/88/7976 89:52 AM 3.98 (H) 0.61 - 1.24 mg/dL Final  95/76/7976 93:97 AM 2.35 (H) 0.61 - 1.24 mg/dL Final  95/77/7976 94:56 AM 2.43 (H) 0.61 - 1.24 mg/dL Final   Recent Labs  Lab 04/22/24 1215 04/23/24 0757 04/23/24 0915 04/24/24 0313 04/25/24 0337  NA 154* 150* 151* 155* 150*  K 3.8 2.9* 2.8* 3.0* 3.1*  CL 120* 116* 115* 121* 116*  CO2 14* 24 25 24 24   GLUCOSE 233* 211* 246* 146* 311*  BUN 108* 110* 106* 92* 80*  CREATININE 6.27* 5.80* 5.72* 5.74* 5.39*  CALCIUM  10.2 8.6* 8.5* 8.5* 8.2*  PHOS  --   --   --   --  3.6   Recent Labs  Lab 04/22/24 1215 04/23/24 0915 04/24/24 0313 04/25/24 0337  WBC 1.9* 1.0* 0.8* 1.4*  NEUTROABS 1.1* 0.3* 0.1* 0.6*  HGB 8.0* 6.5* 8.3* 7.9*  HCT 26.1* 20.6* 26.6* 26.0*  MCV 95.3 93.6 94.0 95.9  PLT 82* 74* 81* 95*   Liver Function Tests: Recent Labs  Lab 04/22/24 1215 04/23/24 0757 04/25/24 0337  AST 29 24  --   ALT 27 19  --   ALKPHOS 108 64  --   BILITOT 0.4 0.5  --   PROT 6.6 5.1*  --   ALBUMIN 3.4* 2.1* 1.9*   No results for input(s): LIPASE, AMYLASE in the last 168 hours. No results for input(s): AMMONIA in the last 168 hours. Cardiac Enzymes: No results for input(s): CKTOTAL, CKMB, CKMBINDEX, TROPONINI in the last  168 hours. Iron Studies: No results for input(s): IRON, TIBC, TRANSFERRIN, FERRITIN in the last 72 hours. PT/INR: @LABRCNTIP (inr:5)  Xrays/Other Studies: ) Results for orders placed or performed during the hospital encounter of 04/22/24 (from the past 48 hours)  Glucose, capillary     Status: Abnormal   Collection Time: 04/23/24 11:24 AM  Result Value Ref Range   Glucose-Capillary 240 (H) 70 - 99 mg/dL    Comment: Glucose reference range applies only to samples taken after fasting for at least 8 hours.  Prepare RBC (crossmatch)     Status: None   Collection Time: 04/23/24 11:30 AM  Result Value Ref Range   Order Confirmation      ORDER PROCESSED BY BLOOD BANK Performed at [facility], [address], [place], [place] [address]   Type and screen [facility]     Status: None   Collection Time: 04/23/24 11:30 AM  Result Value Ref Range   ABO/RH(D) O POS    Antibody Screen NEG    Sample Expiration 04/26/2024,2359    Unit Number [id number]    Blood Component Type RED CELLS,LR    Unit division 00    Status of Unit ISSUED,FINAL    Transfusion Status OK TO TRANSFUSE    Crossmatch Result      Compatible Performed at [facility], [address], [place], [place] [address]   Glucose, capillary     Status: Abnormal   Collection Time: 04/23/24  4:42 PM  Result Value Ref Range   Glucose-Capillary 170 (H) 70 - 99 mg/dL    Comment: Glucose reference range applies only to samples taken after  fasting for at least 8 hours.  Glucose, capillary     Status: Abnormal   Collection Time: 04/23/24  9:34 PM  Result Value Ref Range   Glucose-Capillary 147 (H) 70 - 99 mg/dL    Comment: Glucose reference range applies only to samples taken after fasting for at least 8 hours.   Comment 1 Notify RN    Comment 2 Document in Chart   CBC with Differential/Platelet     Status: Abnormal   Collection Time: 04/24/24  3:13 AM  Result Value Ref Range   WBC 0.8 (LL)  4.0 - 10.5 K/uL    Comment: CRITICAL VALUE NOTED.  VALUE IS CONSISTENT WITH PREVIOUSLY REPORTED AND CALLED VALUE. REPEATED TO VERIFY    RBC 2.83 (L) 4.22 - 5.81 MIL/uL   Hemoglobin 8.3 (L) 13.0 - 17.0 g/dL   HCT 73.3 (L) 60.9 - 47.9 %   MCV 94.0 80.0 - 100.0 fL   MCH 29.3 26.0 - 34.0 pg   MCHC 31.2 30.0 - 36.0 g/dL   RDW 84.3 (H) 88.4 - 84.4 %   Platelets 81 (L) 150 - 400 K/uL    Comment: REPEATED TO VERIFY Immature Platelet Fraction may be clinically indicated, consider ordering this additional test [id number]    nRBC 2.7 (H) 0.0 - 0.2 %   Neutrophils Relative % 16 %   Neutro Abs 0.1 (LL) 1.7 - 7.7 K/uL    Comment: REPEATED TO VERIFY CRITICAL VALUE NOTED.  VALUE IS CONSISTENT WITH PREVIOUSLY REPORTED AND CALLED VALUE.    Lymphocytes Relative 60 %   Lymphs Abs 0.5 (L) 0.7 - 4.0 K/uL   Monocytes Relative 20 %   Monocytes Absolute 0.2 0.1 - 1.0 K/uL   Eosinophils Relative 3 %   Eosinophils Absolute 0.0 0.0 - 0.5 K/uL   Basophils Relative 0 %   Basophils Absolute 0.0 0.0 - 0.1 K/uL   WBC Morphology MORPHOLOGY UNREMARKABLE    RBC Morphology MORPHOLOGY UNREMARKABLE    Immature Granulocytes 1 %   Abs Immature Granulocytes 0.01 0.00 - 0.07 K/uL    Comment: Performed at [facility], [address], [place], [place] [address]  Basic metabolic panel with GFR     Status: Abnormal   Collection Time: 04/24/24  3:13 AM  Result Value Ref Range   Sodium 155 (H) 135 - 145 mmol/L   Potassium 3.0 (L) 3.5 - 5.1 mmol/L   Chloride 121 (H) 98 - 111 mmol/L   CO2 24 22 - 32 mmol/L   Glucose, Bld 146 (H) 70 - 99 mg/dL    Comment: Glucose reference range applies only to samples taken after fasting for at least 8 hours.   BUN 92 (H) 8 - 23 mg/dL   Creatinine, Ser 4.25 (H) 0.61 - 1.24 mg/dL   Calcium  8.5 (L) 8.9 - 10.3 mg/dL   GFR, Estimated 10 (L) >60 mL/min    Comment: (NOTE) Calculated using the CKD-EPI Creatinine Equation (2021)    Anion gap 10 5 - 15    Comment: Performed at [facility], [address], [place], [place] [address]  Magnesium     Status: None   Collection Time: 04/24/24  3:13 AM  Result Value Ref Range   Magnesium 2.2 1.7 - 2.4 mg/dL    Comment: Performed at [facility] Lab, [address], [place], [place] [address]  Glucose, capillary     Status: Abnormal   Collection Time: 04/24/24  7:36 AM  Result Value  Ref Range   Glucose-Capillary 131 (H) 70 - 99 mg/dL    Comment: Glucose reference range applies only to samples taken after fasting for at least 8 hours.  Glucose, capillary     Status: Abnormal   Collection Time: 04/24/24 12:04 PM  Result Value Ref Range   Glucose-Capillary 177 (H) 70 - 99 mg/dL    Comment: Glucose reference range applies only to samples taken after fasting for at least 8 hours.  Glucose, capillary     Status: Abnormal   Collection Time: 04/24/24  3:57 PM  Result Value Ref Range   Glucose-Capillary 194 (H) 70 - 99 mg/dL    Comment: Glucose reference range applies only to samples taken after fasting for at least 8 hours.  Glucose, capillary     Status: Abnormal   Collection Time: 04/24/24  8:41 PM  Result Value Ref Range   Glucose-Capillary 182 (H) 70 - 99 mg/dL    Comment: Glucose reference range applies only to samples taken after fasting for at least 8 hours.  Renal function panel     Status: Abnormal   Collection Time: 04/25/24  3:37 AM  Result Value Ref Range   Sodium 150 (H) 135 - 145 mmol/L   Potassium 3.1 (L) 3.5 - 5.1 mmol/L   Chloride 116 (H) 98 - 111 mmol/L   CO2 24 22 - 32 mmol/L   Glucose, Bld 311 (H) 70 - 99 mg/dL    Comment: Glucose reference range applies only to samples taken after fasting for at least 8 hours.   BUN 80 (H) 8 - 23 mg/dL   Creatinine, Ser 4.60 (H) 0.61 - 1.24 mg/dL   Calcium  8.2 (L) 8.9 - 10.3 mg/dL   Phosphorus 3.6 2.5 - 4.6 mg/dL   Albumin 1.9 (L) 3.5 - 5.0 g/dL   GFR, Estimated 11 (L) >60 mL/min    Comment: (NOTE) Calculated using the CKD-EPI Creatinine Equation (2021)     Anion gap 10 5 - 15    Comment: Performed at [facility], [address] [place], [place] [address]  CBC with Differential/Platelet     Status: Abnormal   Collection Time: 04/25/24  3:37 AM  Result Value Ref Range   WBC 1.4 (LL) 4.0 - 10.5 K/uL    Comment: REPEATED TO VERIFY CRITICAL VALUE NOTED.  VALUE IS CONSISTENT WITH PREVIOUSLY REPORTED AND CALLED VALUE.    RBC 2.71 (L) 4.22 - 5.81 MIL/uL   Hemoglobin 7.9 (L) 13.0 - 17.0 g/dL   HCT 73.9 (L) 60.9 - 47.9 %   MCV 95.9 80.0 - 100.0 fL   MCH 29.2 26.0 - 34.0 pg   MCHC 30.4 30.0 - 36.0 g/dL   RDW 83.7 (H) 88.4 - 84.4 %   Platelets 95 (L) 150 - 400 K/uL    Comment: REPEATED TO VERIFY Immature Platelet Fraction may be clinically indicated, consider ordering this additional test [id number]    nRBC 0.0 0.0 - 0.2 %   Neutrophils Relative % 44 %   Neutro Abs 0.6 (L) 1.7 - 7.7 K/uL   Lymphocytes Relative 41 %   Lymphs Abs 0.6 (L) 0.7 - 4.0 K/uL   Monocytes Relative 14 %   Monocytes Absolute 0.2 0.1 - 1.0 K/uL   Eosinophils Relative 1 %   Eosinophils Absolute 0.0 0.0 - 0.5 K/uL   Basophils Relative 0 %   Basophils Absolute 0.0 0.0 - 0.1 K/uL   WBC Morphology MORPHOLOGY UNREMARKABLE    RBC Morphology MORPHOLOGY UNREMARKABLE  Smear Review See Note     Comment: PLATELETS APPEAR DECREASED   Immature Granulocytes 0 %   Abs Immature Granulocytes 0.00 0.00 - 0.07 K/uL    Comment: Performed at [facility], [address], [place], [place] [address]  Glucose, capillary     Status: Abnormal   Collection Time: 04/25/24  7:03 AM  Result Value Ref Range   Glucose-Capillary 294 (H) 70 - 99 mg/dL    Comment: Glucose reference range applies only to samples taken after fasting for at least 8 hours.   CT ABDOMEN PELVIS WO CONTRAST Result Date: 04/23/2024 CLINICAL DATA:  Abdominal pain, acute, nonlocalized EXAM: CT ABDOMEN AND PELVIS WITHOUT CONTRAST TECHNIQUE: Multidetector CT imaging of the abdomen and pelvis was performed  following the standard protocol without IV contrast. RADIATION DOSE REDUCTION: This exam was performed according to the departmental dose-optimization program which includes automated exposure control, adjustment of the mA and/or kV according to patient size and/or use of iterative reconstruction technique. COMPARISON:  March 21, 2024 FINDINGS: Of note, the lack of intravenous contrast limits evaluation of the solid organ parenchyma and vascularity. Lower chest: Small bilateral pleural effusions with multifocal atelectasis in the lung bases. Mild cardiomegaly. Partially visualized chest port terminating in the right atrium. Hepatobiliary: No mass.No radiopaque stones or wall thickening of the gallbladder. No intrahepatic or extrahepatic biliary ductal dilation. Pancreas: No mass or main ductal dilation. No peripancreatic inflammation or fluid collection. Spleen: Normal size. No mass. Adrenals/Urinary Tract: No adrenal masses. Right nephrectomy. No left renal mass. No hydronephrosis or nephrolithiasis. The urinary bladder is distended without focal abnormality. Stomach/Bowel: The stomach is decompressed without focal abnormality. No small bowel wall thickening or inflammation. No small bowel obstruction.Normal appendix. Vascular/Lymphatic: No aortic aneurysm. Diffuse aortoiliac atherosclerosis. Redemonstrated enlargement of the retroperitoneal lymphadenopathy, measuring 1.6 cm in the left periaortic region (previously, 1.4 cm). 1.2 cm mesenteric root lymph node is partially calcified, unchanged (axial 36). Reproductive: No prostatomegaly. Other: No pneumoperitoneum. Increasing intra-abdominal ascites, most notably in the perihepatic recesses, causing significant mass effect on the right hepatic lobe. A couple of small regions of nodularity noted in the fluid collection posteriorly (for example, axial 23), measuring 1.6 cm. Extensive omental soft tissue nodularity again noted. Musculoskeletal: No acute fracture or  destructive lesion. Diffuse sclerotic metastases throughout the visualized skeleton again noted. IMPRESSION: 1. Increasing volume of malignant ascites, most notably within the perihepatic recesses causing mass effect on the right hepatic lobe. Diffuse peritoneal carcinomatosis is similar in appearance with a couple of small nodules developing in the perihepatic fluid. 2. Slight enlargement of 1 of the left periaortic lymph nodes measuring 1.6 cm in short axis dimension (previously, 1.4 cm). 3. Redemonstrated sclerotic bony metastases diffusely throughout the skeleton. 4. Small bilateral pleural effusions, increasing in size in the interim. Aortic Atherosclerosis (ICD10-I70.0). Electronically Signed   By: Rogelia Myers M.D.   On: 04/23/2024 15:01    PMH:   Past Medical History:  Diagnosis Date   ARF (acute renal failure) (HCC)    d/t lisinopril and/or diazide- was on both in a 2 week period- cr up to 4.99   Diabetes mellitus    Family history of lung cancer    Hyperlipidemia    Hypertension    Personal history of other malignant neoplasm of small intestine    RENAL CALCULUS, HX OF 06/26/2007    PSH:   Past Surgical History:  Procedure Laterality Date   COLONOSCOPY N/A 03/04/2016   Procedure: COLONOSCOPY;  Surgeon:  Belvie Just, MD;  Location: [facility] ENDOSCOPY;  Service: Endoscopy;  Laterality: N/A;   ENTEROSCOPY N/A 03/04/2016   Procedure: ENTEROSCOPY;  Surgeon: Belvie Just, MD;  Location: [facility] ENDOSCOPY;  Service: Endoscopy;  Laterality: N/A;   IR IMAGING GUIDED PORT INSERTION  04/08/2024    Allergies:  Allergies  Allergen Reactions   Penicillins Other (See Comments)    Pt states that he cannot really remember reaction. Tentatively reports swelling/hives    Medications:   Prior to Admission medications   Medication Sig Start Date End Date Taking? Authorizing Provider  abiraterone  acetate (ZYTIGA ) 250 MG tablet TAKE 4 TABLETS BY MOUTH ONCE DAILY AS DIRECTED.  TAKE 1 HOUR BEFORE OR 2 HOURS  AFTER A MEAL 11/23/23  Yes Cloretta Arley NOVAK, MD  amLODipine  (NORVASC ) 10 MG tablet Take 10 mg by mouth daily. 04/21/16  Yes [provider]  apixaban  (ELIQUIS ) 2.5 MG TABS tablet Take 1 tablet by mouth twice daily 04/04/24  Yes Cloretta Arley NOVAK, MD  docusate sodium  (COLACE) 100 MG capsule Take 100 mg by mouth daily as needed for mild constipation.   Yes [provider]  ferrous sulfate  325 (65 FE) MG EC tablet Take 1 tablet (325 mg total) by mouth 3 (three) times daily with meals. 11/06/19  Yes Cloretta Arley NOVAK, MD  lidocaine -prilocaine  (EMLA ) cream Apply 1 Application topically as needed (Apply to port 1-2 hours before its use starting 2 weeks after it is placed). 04/01/24  Yes Cloretta Arley NOVAK, MD  linagliptin (TRADJENTA) 5 MG TABS tablet Take 1 tablet by mouth daily. 02/17/24 04/22/24 Yes [provider]  loperamide  (IMODIUM ) 2 MG capsule Take 2 mg by mouth daily as needed for diarrhea or loose stools.   Yes [provider]  ondansetron  (ZOFRAN ) 8 MG tablet Take 1 tablet (8 mg total) by mouth every 8 (eight) hours as needed (may use 3 days after chemo as needed for nausea). 04/01/24  Yes Cloretta Arley NOVAK, MD  predniSONE  (DELTASONE ) 5 MG tablet Take 1 tablet by mouth once daily with breakfast 03/11/24  Yes Cloretta Arley NOVAK, MD  prochlorperazine  (COMPAZINE ) 10 MG tablet Take 1 tablet (10 mg total) by mouth every 6 (six) hours as needed for nausea or vomiting. 04/01/24  Yes Cloretta Arley NOVAK, MD  rosuvastatin  (CRESTOR ) 10 MG tablet Take 10 mg by mouth every morning. 02/23/21  Yes [provider]  tamsulosin  (FLOMAX ) 0.4 MG CAPS capsule Take 0.4 mg by mouth at bedtime. 05/02/16  Yes [provider]    Discontinued Meds:   Medications Discontinued During This Encounter  Medication Reason   torsemide (DEMADEX) 10 MG tablet    empagliflozin (JARDIANCE) 10 MG TABS tablet Discontinued by provider   sodium bicarbonate  150 mEq in dextrose  5 % 1,150 mL infusion     insulin  aspart (novoLOG ) injection 0-5 Units    insulin  aspart (novoLOG ) injection 0-9 Units     Social History:  reports that he has never smoked. He has never used smokeless tobacco. He reports that he does not drink alcohol and does not use drugs.  Family History:   Family History  Problem Relation Age of Onset   Cancer Mother    Diabetes Mother    Hypertension Mother    Coronary artery disease Mother    Hyperlipidemia Sister    Hypertension Sister     Blood pressure 98/66, pulse 95, temperature 97.6 F (36.4 C), temperature source Oral, resp. rate 18, weight 67.4 kg, SpO2 100%. Physical Exam: General exam:  Appears comfortable  Respiratory system: CTA b/l, normal rate Cardiovascular system: S1 & S2 heard, RRR.  Bilateral pedal edema+. Gastrointestinal system: Abdomen SNDNT+BS Central nervous system: Alert and oriented. No focal neurological deficits. Extremities: Bilateral lower extremities pitting edema present, no cyanosis. Skin: No rashes, lesions or ulcers Psychiatry: Judgement and insight appear normal. Mood & affect appropriate.      MELIA LYNWOOD ORN, MD 04/25/2024, 9:43 AM

## 2024-04-25 NOTE — Progress Notes (Signed)
 IP PROGRESS NOTE  Subjective:   Brandon Peters is up in the chair this morning.  He reports nausea.  No emesis.  He is tolerating a liquid diet.  He has decided against hemodialysis.  He continues to discuss CODE STATUS and hospice with his family.  Objective: Vital signs in last 24 hours: Blood pressure 98/66, pulse 95, temperature 97.6 F (36.4 C), temperature source Oral, resp. rate 18, weight 148 lb 9.6 oz (67.4 kg), SpO2 100%.  Intake/Output from previous day: 07/30 0701 - 07/31 0700 In: 2524 [P.O.:960; I.V.:1464; IV Piggyback:100] Out: 1300 [Urine:1300]  Physical Exam:  HEENT: No thrush Lungs: Decreased breath sounds and inspiratory rhonchi at the lower posterior chest bilaterally, no respiratory distress Cardiac: Regular rate and rhythm Abdomen: Mildly distended, nontender, no mass Extremities: 1+ pitting edema to lower leg and foot bilaterally   Portacath/PICC-without erythema  Lab Results: Recent Labs    04/24/24 0313 04/25/24 0337  WBC 0.8* 1.4*  HGB 8.3* 7.9*  HCT 26.6* 26.0*  PLT 81* 95*    BMET Recent Labs    04/24/24 0313 04/25/24 0337  NA 155* 150*  K 3.0* 3.1*  CL 121* 116*  CO2 24 24  GLUCOSE 146* 311*  BUN 92* 80*  CREATININE 5.74* 5.39*  CALCIUM  8.5* 8.2*    Lab Results  Component Value Date   CEA1 3.31 05/03/2019   CEA 4.56 04/09/2024    Studies/Results: No results found.   Medications: I have reviewed the patient's current medications.  Assessment/Plan: History of Severe anemia-status post a red cell transfusion 03/04/2016 2.. Prostate cancer Extensive sclerotic bone metastases noted on a CT of the abdomen/pelvis 03/04/2016 and MRI abdomen 03/05/2016 Markedly elevated PSA Lupron  initiated 03/15/2016; Casodex  14 days beginning 03/15/2016 Prostate biopsy 03/18/2016 05/09/2016 PSA significantly improved at 21 Abiraterone  initiated 05/10/2016 PSA less than 0.1 07/10/2018 PSA less than 0.1 08/30/2018 PSA less than 0.1  10/18/2018 PSA less than 0.1 11/29/2018 PSA less than 0.1 on 11/06/2019  PSA less than 0.1 on 03/05/2020 PSA less than 0.1 on 07/06/2020 PSA less than 0.1 08/17/2021 PSA less than 0.1 on 09/30/2021 PSA 0.1 on 11/12/2021 PSA 0.1 on 02/18/2022 PSA 0.2 on 12/09/2022 PSA 0.3 on 04/04/2023  PSA 0.4 on 06/06/2023 PSA 0.5 on 07/05/2023 PSA 0.7 on 08/09/2023 PSA 1.5 on 12/07/2023 PSA 4.7 on 02/06/2024 PSA 6.37 on 02/14/2024 PSA 5.0 on 02/22/2024 PSA 7.3 on 03/28/2024     3.   Symptoms of obstructive uropathy. Improved   4.   Distal duodenal and descending colon masses biopsy of the duodenal mass 03/04/2016 confirmed fragments of small bowel mucosa with focal high-grade dysplasia Biopsy of the descending colon polyps revealed fragments of a tubulovillous adenoma CT 08/29/2017-similar soft tissue thickening in the region of the splenic flexure of the colon suboptimally evaluated due to lack of colonic enteric contrast Referred to Dr. Rollin Referred to [facility] Resection of descending colon polyps 01/31/2018, tubulovillous adenomas with high-grade dysplasia Attempted endoscopic resection of the fourth segment duodenal polyp on 05/09/2018, firm unresectable lesion, biopsy revealed moderately differentiated adenocarcinoma CT abdomen/pelvis 05/25/2018- heterogeneously enhancing right lower pole renal lesion; diffuse sclerotic osseous metastatic lesions compatible with history of prostate cancer CT chest 05/25/2018- no pulmonary metastasis.  Diffuse osseous metastasis involving the axial and appendicular skeleton. 06/19/2018-sleeve resection of third and fourth portions of duodenum with primary end-to-end anastomosis, feeding gastrojejunostomy tube placement, Dr. Ames; poorly differentiated adenocarcinoma duodenum, 3 cm; tumor invades the muscularis propria; lymphovascular invasion present; negative margins; 4 of 11 involved lymph  nodes; soft tissue deposits also seen in the mesentery; all mucosal margins and soft tissue  margins negative but 2 of the positive nodes are found in the soft tissue margin submitted for frozen section (pT2 pN2); second mucosal lesion 1.2 cm from the proximal margin, tubular adenoma with focal severe dysplasia Intact expression of mismatch repair proteins by IHC; microsatellite stable Cycle 1 adjuvant Xeloda  07/23/2018 Cycle 2 adjuvant Xeloda  08/13/2018 Cycle 3 adjuvant Xeloda  09/10/2018 (dose reduced to 1000 mg twice daily for 14 days)  Cycle 4 adjuvant Xeloda  10/01/2018 (dose increased to 1500 mg every morning and 1000 mg every afternoon for 14 days) Cycle 5 adjuvant Xeloda  10/22/2018 Cycle 6 adjuvant Xeloda  11/12/2018 Cycle 7 adjuvant Xeloda  12/03/2018 Cycle 8 adjuvant Xeloda  12/24/2018   5.  Anorexia/weight loss- resolved   6.  Undescended testicles   7.  Right renal mass.  CT 08/29/2017-right renal mass involving the lower pole was partially calcified and does demonstrate enhancement following contrast most consistent with renal cell carcinoma.  Lesion not significantly enlarged compared with the prior CT. Followed by urology. 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 to the IVC/RA junction, upper normal size retrocaval node 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, nuclear grade 3 Axitinib /pembrolizumab  10/22/2021 Axitinib  placed on hold 11/05/2021 due to hand-foot syndrome Axitinib  resumed at a dose of 5 mg daily 11/12/2021, pembrolizumab   11/12/2021 Axitinib  discontinued 12/03/2021 secondary to progressive hand-foot syndrome, pembrolizumab  12/03/2021 Axitinib  resumed at a reduced dose of 2 mg twice daily after office visit 12/14/2021 Pembrolizumab  12/24/2021 Axitinib  stopped 01/03/2022 secondary to persistent hand/foot symptoms CT chest at [facility] 01/13/2022-no evidence of thoracic metastatic disease, similar diffuse sclerotic change in the skeleton MRI abdomen at [facility] 01/13/2022-similar size of right lower pole renal mass with renal vein thrombosis, decreased overall extent of thrombus now extending to the intrahepatic IVC, enhancement of right renal mass and thrombus is decreased, new left hydroureter ureter nephrosis with asymmetric perinephric stranding Pembrolizumab  01/27/2022 Axitinib  resumed 2 mg daily 01/28/2022 CT abdomen/pelvis at [facility] 02/10/2022-overall reduced size of right lower pole renal mass which demonstrates persistent region of enhancement along the posterior and lateral margin with extension into the renal sinus.  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  Query additional IVC thrombus at the intrahepatic IVC and extending into the right atrium. Axitinib  continued 2 mg daily, Pembrolizumab  every 3 weeks CT 05/05/2022 at [facility]-partially visualized pulmonary embolism involving segmental arteries of the right lower lobe.  Interval slight decrease in size of the mass arising from the lower pole the right kidney.  Mass demonstrates persistent moderate necrosis centrally.  Increased enhancing soft tissue component measuring up to 2 cm which is mildly increased in size compared to prior from 02/10/2022.  Bland tumor thrombus identified in the IVC at the level of the right renal vein ostium extending into the suprarenal IVC superiorly.  No obvious thrombus identified in the infrarenal, intrahepatic and suprahepatic IVC.  Likely trace bland thrombus in the anterior right renal vein.  Stable several subcentimeter  retroperitoneal lymph nodes.  Diffuse  osseous sclerotic and lytic metastatic disease stable compared to prior. Axitinib  placed on hold 05/04/2022 due to pain bilateral feet; 05/13/2022 he will continue to hold axitinib  Pembrolizumab  05/13/2022 Pembrolizumab  06/03/2022 Robotic right radical nephrectomy and IVC thrombectomy 06/27/2022-right kidney with tumor necrosis/hemorrhage,ypT0, adherent thrombus with cellular necrosis and no viable carcinoma present at the renal vein margin, other margins negative for carcinoma, 0/3 lymph nodes.  6.5 cm tumor including intraparenchymal lesion and thrombus in the renal vein 8.  Diabetes   9.  Anemia-stool Hemoccults + March 2021, referred to Dr. Rollin for endoscopic evaluation, anemia also secondary to metastatic prostate cancer, hypogonadism Small bowel enteroscopy 02/11/2020-esophagus normal.  Hematin found in the gastric body.  Examined duodenum normal.  No evidence of significant pathology in the entire examined portion of the jejunum.  There was no overt source of bleeding or inflammation.  The bleeding may be from small AVMs.  Surgical anastomosis was not visible with repeated inspection of the duodenum. Colonoscopy 02/11/2020-multiple sessile and semipedunculated polyps were found in the rectum, descending colon and ascending colon, 3 to 18 mm in size.  Polyps removed (polypectomy descending colon-fragments of tubular adenoma, inflammatory type polyp; polypectomy ascending colon-tubular adenoma, no high-grade dysplasia or malignancy; polypectomy descending colon-fragments of tubulovillous adenoma, no high-grade dysplasia or malignancy; polypectomy colon, rectum-tubular adenoma, no high-grade dysplasia or malignancy). Colonoscopy 02/02/2021-seven 2 to 10 mm polyps in the sigmoid colon (tubular adenoma), descending colon (fragments of tubulovillous adenoma, fragments of tubular adenoma ) and transverse colon (fragments of tubular adenoma). Progressive anemia 07/05/2021;  stool cards positive Upper endoscopy/colonoscopy 09/23/2021-multiple polyps removed from the colon-tubular adenomas, diffuse angioectasias in the stomach with bleeding-not amenable to endoscopic treatment 10.  Renal failure-progressive 01/17/2022, progressive following right nephrectomy October 2023 11.  CT chest at [facility] 01/13/2022-no evidence of thoracic metastatic disease, similar diffuse sclerotic changes in the skeleton 12.  CT renal stone study 01/14/2022-mild left side hydroureteronephrosis secondary to a left UVJ stone, left nephrolithiasis, new right middle lobe collapse/consolidation, edema/fluid at the left iliopsoas muscle 13.  PE noted on CT 05/05/2022-on apixaban .  He saw Dr. Darcy at [facility] 05/16/2022; instructions are to hold apixaban  72 hours prior to scheduled surgery and start Lovenox at prophylactic dosing. 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  1 node measuring minimally larger and the other minimally smaller.  Similar omental/peritoneal metastasis with mild increase in small volume abdominopelvic ascites.  Similar diffuse osseous metastasis.  New tiny left pleural effusion. Cycle 1 FOLFOX 04/09/2024  15.  Admission 04/22/2024 with nausea/vomiting and progressive renal failure  Brandon Peters is now at day 17 following cycle 1 FOLFOX given for treatment of metastatic small bowel carcinoma.  He was admitted  with intractable nausea/vomiting and progressive renal failure.  The and vomiting began  approximately 1 week following chemotherapy.  I think it is unlikely GI symptoms are related to chemotherapy, but possible.  I think is more likely the nausea is related to carcinomatosis.  The CT Abdo/pelvis 04/23/2024 revealed increased ascites and persistent carcinomatosis.  No bowel obstruction.  He has developed delayed severe pancytopenia.  The neutropenia/thrombocytopenia are secondary to chemotherapy.  The anemia is secondary to chronic renal  failure, metastatic prostate cancer involving the bone marrow,chemotherapy, and potentially GI bleeding.  The neutrophil count is higher today.  The hemoglobin and platelet count are stable.  I discussed goals of care with Brandon Peters again today.SABRA  He wants to continue treatment of the cancer and would like to remain on a full CODE STATUS for now.  He has decided against hemodialysis.    He has a poor prognosis with the renal failure, metastatic prostate cancer, and metastatic small bowel cancer.  I will continue discussions with him regarding indication for continuing treatment of the cancer.  We discussed hospice care.  He will continue discussions with his family regarding CODE STATUS and hospice.  He may be stable for discharge over the next few days if the renal function stabilizes and the white count continues to improve  Recommendations: Daily CBC with differential Antibiotics for presumed UTI Management of renal failure and electrolytes per nephrology Regular diet as tolerated Continue discussions with patient and family regarding CODE STATUS and hospice      LOS: 3 days   Arley Hof, MD   04/25/2024, 1:52 PM

## 2024-04-25 NOTE — Progress Notes (Signed)
 [facility] PROGRESS NOTE    Progress Note  Deanthony Peters  FMW:[id number] DOB: 1956-01-18 DOA: 04/22/2024 PCP: Bari Morgans, NP     Brief Narrative:   Brandon Peters is an 68 y.o. 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  Sent from the cancer center on the day of admission for sodium of 154 bicarb of 14 creatinine of 6.2, white blood cell count of 1.9 hemoglobin of 8 platelet count of 82 transferred to [facility] Assessment/Plan:   AKI (acute kidney injury) (HCC) on chronic kidney disease stage IV, with high anion gap anion gap metabolic acidosis: On admission creatinine was 6.2, now this morning 5.3, anion gap metabolic acidosis resolved. Renal ultrasound unremarkable left kidney with right nephrectomy. CT scan of the abdomen pelvis showed increase in size of malignant ascites, and perihepatic mass effect in the right hepatic lobe, with diffuse needle carcinomatosis.   Nephrology was consulted and was concerned of intractable nausea and vomiting in the setting of chemotherapy. They are hopeful that his creatinine will improve no indication for hemodialysis at this point in time. Awaiting palliative care meeting Further management per nephrology.  Hyponatremia/hypokalemia: Started on D5 with potassium supplement, potassium is about the same further management per nephrology. Sodium is improving this morning is 150.  Intractable nausea and vomiting: On Protonix  twice a day CT scan of the abdomen pelvis unremarkable. Tolerating his diet  will transition to a renal diet. Question of due to chemotherapy.  Pancytopenia/neutropenia: Likely due to chemotherapy no evidence of bleeding. Status post 2 units hemoglobin this morning 7.9. Oncology on board.  Continue CBCs daily and Rocephin  for fevers and neutropenia.  Metastatic small bowel cancer currently on chemotherapy: Last chemo 04/09/2024. Oncology on board.  Will discuss with oncology to see if he is candidate for granulocyte stimulating factor as his white blood cell count is trending down.  Controlled type 2 diabetes mellitus without complication, without long-term current use of insulin   Hemoglobin A1c of 7.2, blood glucose trending down significantly elevated add long-acting insulin  continue sliding scale sensitive.  Essential hypertension Off antihypertensive medication, relatively well-controlled.  Chronic pulmonary embolism (HCC) Holding Eliquis , will discuss with oncology to see if we can restart Eliquis . Currently on heparin  for DVT prophylaxis. Platelet count is 81 and has remained relatively stable.  Physical deconditioning: PT OT evaluated.  Goals of care: Overall very poor prognosis palliative care was been consulted and waiting recommendation. Agree with nephrology and we recommend hospice.   DVT prophylaxis: heparin  Family Communication:none Status is: Inpatient Remains inpatient appropriate because: Acute kidney injury    Code Status:     Code Status Orders  (From admission, onward)           Start     Ordered   04/22/24 1701  Full code  Continuous       Question:  By:  Answer:  Consent: discussion documented in EHR   04/22/24 1703           Code Status History     Date Active Date Inactive Code Status Order ID Comments User Context   04/08/2024 1622 04/09/2024 0510  Full Code [id number]  Vanice Sharper, MD [facility]   03/07/2024 1126 03/08/2024 0509 Full Code [id number]  Jenna Cordella LABOR, MD [facility]   01/14/2022 1843 01/16/2022 2020 Full  Code [id number]  Ricky Alfrieda DASEN, DO Inpatient   03/03/2016 1628 03/06/2016 1735 Full Code [id number]  Jeannetta Lonni ORN, MD ED         IV Access:   Peripheral IV   Procedures and diagnostic studies:   CT ABDOMEN PELVIS WO CONTRAST Result Date: 04/23/2024 CLINICAL DATA:  Abdominal pain, acute, nonlocalized EXAM: CT ABDOMEN AND PELVIS WITHOUT CONTRAST TECHNIQUE: Multidetector CT imaging of the abdomen and pelvis was performed following the standard protocol without IV contrast. RADIATION DOSE REDUCTION: This exam was performed according to the departmental dose-optimization program which includes automated exposure control, adjustment of the mA and/or kV according to patient size and/or use of iterative reconstruction technique. COMPARISON:  March 21, 2024 FINDINGS: Of note, the lack of intravenous contrast limits evaluation of the solid organ parenchyma and vascularity. Lower chest: Small bilateral pleural effusions with multifocal atelectasis in the lung bases. Mild cardiomegaly. Partially visualized chest port terminating in the right atrium. Hepatobiliary: No mass.No radiopaque stones or wall thickening of the gallbladder. No intrahepatic or extrahepatic biliary ductal dilation. Pancreas: No mass or main ductal dilation. No peripancreatic inflammation or fluid collection. Spleen: Normal size. No mass. Adrenals/Urinary Tract: No adrenal masses. Right nephrectomy. No left renal mass. No hydronephrosis or nephrolithiasis. The urinary bladder is distended without focal abnormality. Stomach/Bowel: The stomach is decompressed without focal abnormality. No small bowel wall thickening or inflammation. No small bowel obstruction.Normal appendix. Vascular/Lymphatic: No aortic aneurysm. Diffuse aortoiliac atherosclerosis. Redemonstrated enlargement of the retroperitoneal lymphadenopathy, measuring 1.6 cm in the left periaortic region (previously, 1.4 cm). 1.2 cm mesenteric root lymph node is partially calcified,  unchanged (axial 36). Reproductive: No prostatomegaly. Other: No pneumoperitoneum. Increasing intra-abdominal ascites, most notably in the perihepatic recesses, causing significant mass effect on the right hepatic lobe. A couple of small regions of nodularity noted in the fluid collection posteriorly (for example, axial 23), measuring 1.6 cm. Extensive omental soft tissue nodularity again noted. Musculoskeletal: No acute fracture or destructive lesion. Diffuse sclerotic metastases throughout the visualized skeleton again noted. IMPRESSION: 1. Increasing volume of malignant ascites, most notably within the perihepatic recesses causing mass effect on the right hepatic lobe. Diffuse peritoneal carcinomatosis is similar in appearance with a couple of small nodules developing in the perihepatic fluid. 2. Slight enlargement of 1 of the left periaortic lymph nodes measuring 1.6 cm in short axis dimension (previously, 1.4 cm). 3. Redemonstrated sclerotic bony metastases diffusely throughout the skeleton. 4. Small bilateral pleural effusions, increasing in size in the interim. Aortic Atherosclerosis (ICD10-I70.0). Electronically Signed   By: Rogelia Myers M.D.   On: 04/23/2024 15:01     Medical Consultants:   None.   Subjective:    Brandon Peters patient relates back pain.  Objective:    Vitals:   04/24/24 2042 04/25/24 0513 04/25/24 0703 04/25/24 0804  BP: 102/67 119/68  98/66  Pulse: 87 79  95  Resp: 18 18  (!) 21  Temp: 98 F (36.7 C) 98.6 F (37 C)  97.6 F (36.4 C)  TempSrc:    Oral  SpO2: 96% 100%  100%  Weight:   67.4 kg    SpO2: 100 %   Intake/Output Summary (Last 24 hours) at 04/25/2024 0805 Last data filed at 04/25/2024 0804 Gross per 24 hour  Intake 2524.02 ml  Output 1300 ml  Net 1224.02 ml   Filed Weights   04/23/24 0711 04/24/24 0407 04/25/24 0703  Weight: 66.5 kg 67.1 kg 67.4 kg    Exam: General exam: In no acute distress. Respiratory system: Good air movement and  clear to auscultation. Cardiovascular system: S1 & S2 heard, RRR. No JVD. Gastrointestinal system: Abdomen is nondistended, soft and nontender.  Extremities: No pedal edema. Skin: No rashes, lesions or ulcers Psychiatry: Judgement and insight appear normal. Mood & affect appropriate.  Data Reviewed:    Labs: Basic Metabolic Panel: Recent Labs  Lab 04/22/24 1215 04/23/24 0757 04/23/24 0915 04/24/24 0313 04/25/24 0337  NA 154* 150* 151* 155* 150*  K 3.8 2.9* 2.8* 3.0* 3.1*  CL 120* 116* 115* 121* 116*  CO2 14* 24 25 24 24   GLUCOSE 233* 211* 246* 146* 311*  BUN 108* 110* 106* 92* 80*  CREATININE 6.27* 5.80* 5.72* 5.74* 5.39*  CALCIUM  10.2 8.6* 8.5* 8.5* 8.2*  MG  --  2.4  --  2.2  --   PHOS  --   --   --   --  3.6   GFR Estimated Creatinine Clearance: 11.7 mL/min (A) (by C-G formula based on SCr of 5.39 mg/dL (H)). Liver Function Tests: Recent Labs  Lab 04/22/24 1215 04/23/24 0757 04/25/24 0337  AST 29 24  --   ALT 27 19  --   ALKPHOS 108 64  --   BILITOT 0.4 0.5  --   PROT 6.6 5.1*  --   ALBUMIN 3.4* 2.1* 1.9*   No results for input(s): LIPASE, AMYLASE in the last 168 hours. No results for input(s): AMMONIA in the last 168 hours. Coagulation profile No results for input(s): INR, PROTIME in the last 168 hours. COVID-19 Labs  No results for input(s): DDIMER, FERRITIN, LDH, CRP in the last 72 hours.  Lab Results  Component Value Date   SARSCOV2NAA NEGATIVE 01/14/2022    CBC: Recent Labs  Lab 04/19/24 0800 04/22/24 1215 04/23/24 0915 04/24/24 0313 04/25/24 0337  WBC 4.1 1.9* 1.0* 0.8* 1.4*  NEUTROABS 3.3 1.1* 0.3* 0.1* 0.6*  HGB 10.2* 8.0* 6.5* 8.3* 7.9*  HCT 32.8* 26.1* 20.6* 26.6* 26.0*  MCV 95.1 95.3 93.6 94.0 95.9  PLT 90* 82* 74* 81* 95*   Cardiac Enzymes: No results for input(s): CKTOTAL, CKMB, CKMBINDEX, TROPONINI in the last 168 hours. BNP (last 3 results) No results for input(s): PROBNP in the last 8760  hours. CBG: Recent Labs  Lab 04/24/24 0736 04/24/24 1204 04/24/24 1557 04/24/24 2041 04/25/24 0703  GLUCAP 131* 177* 194* 182* 294*   D-Dimer: No results for input(s): DDIMER in the last 72 hours. Hgb A1c: Recent Labs    04/23/24 0758  HGBA1C 7.2*   Lipid Profile: No results for input(s): CHOL, HDL, LDLCALC, TRIG, CHOLHDL, LDLDIRECT in the last 72 hours. Thyroid  function studies: No results for input(s): TSH, T4TOTAL, T3FREE, THYROIDAB in the last 72 hours.  Invalid input(s): FREET3 Anemia work up: No results for input(s): VITAMINB12, FOLATE, FERRITIN, TIBC, IRON, RETICCTPCT in the last 72 hours. Sepsis Labs: Recent Labs  Lab 04/22/24 1215 04/23/24 0915 04/24/24 0313 04/25/24 0337  WBC 1.9* 1.0* 0.8* 1.4*   Microbiology No results found for this or any previous visit (from the past 240 hours).   Medications:    sodium chloride    Intravenous Once   Chlorhexidine  Gluconate Cloth  6 each Topical Daily   feeding supplement  1 Container Oral TID BM   heparin   5,000 Units Subcutaneous Q8H  insulin  aspart  0-5 Units Subcutaneous QHS   insulin  aspart  0-9 Units Subcutaneous TID WC   pantoprazole  (PROTONIX ) IV  40 mg Intravenous Q12H   polyethylene glycol  17 g Oral BID   sodium chloride  flush  10-40 mL Intracatheter Q12H   Continuous Infusions:  cefTRIAXone  (ROCEPHIN )  IV 1 g (04/24/24 1809)   dextrose  5 % with KCl 20 mEq / L 20 mEq (04/25/24 0051)      LOS: 3 days   Brandon Peters  [facility]  04/25/2024, 8:05 AM

## 2024-04-25 NOTE — Plan of Care (Signed)
 Problem: Tissue Perfusion: Goal: Adequacy of tissue perfusion will improve 04/25/2024 1025 by Shela Lapine, RN Outcome: Progressing 04/25/2024 0827 by Shela Lapine, RN Outcome: Progressing   Problem: Education: Goal: Ability to describe self-care measures that may prevent or decrease complications (Diabetes Survival Skills Education) will improve 04/25/2024 1025 by Shela Lapine, RN Outcome: Progressing 04/25/2024 0827 by Shela Lapine, RN Outcome: Progressing Goal: Individualized Educational Video(s) 04/25/2024 1025 by Shela Lapine, RN Outcome: Progressing 04/25/2024 0827 by Shela Lapine, RN Outcome: Progressing   Problem: Coping: Goal: Ability to adjust to condition or change in health will improve 04/25/2024 1025 by Shela Lapine, RN Outcome: Progressing 04/25/2024 0827 by Shela Lapine, RN Outcome: Progressing   Problem: Fluid Volume: Goal: Ability to maintain a balanced intake and output will improve 04/25/2024 1025 by Shela Lapine, RN Outcome: Progressing 04/25/2024 0827 by Shela Lapine, RN Outcome: Progressing   Problem: Health Behavior/Discharge Planning: Goal: Ability to identify and utilize available resources and services will improve 04/25/2024 1025 by Shela Lapine, RN Outcome: Progressing 04/25/2024 0827 by Shela Lapine, RN Outcome: Progressing Goal: Ability to manage health-related needs will improve 04/25/2024 1025 by Shela Lapine, RN Outcome: Progressing 04/25/2024 0827 by Shela Lapine, RN Outcome: Progressing   Problem: Metabolic: Goal: Ability to maintain appropriate glucose levels will improve 04/25/2024 1025 by Shela Lapine, RN Outcome: Progressing 04/25/2024 0827 by Shela Lapine, RN Outcome: Progressing   Problem: Nutritional: Goal: Maintenance of adequate nutrition will improve 04/25/2024 1025 by Shela Lapine, RN Outcome: Progressing 04/25/2024 0827 by Shela Lapine, RN Outcome: Progressing Goal: Progress toward achieving  an optimal weight will improve 04/25/2024 1025 by Shela Lapine, RN Outcome: Progressing 04/25/2024 0827 by Shela Lapine, RN Outcome: Progressing   Problem: Skin Integrity: Goal: Risk for impaired skin integrity will decrease 04/25/2024 1025 by Shela Lapine, RN Outcome: Progressing 04/25/2024 0827 by Shela Lapine, RN Outcome: Progressing   Problem: Tissue Perfusion: Goal: Adequacy of tissue perfusion will improve 04/25/2024 1025 by Shela Lapine, RN Outcome: Progressing 04/25/2024 0827 by Shela Lapine, RN Outcome: Progressing   Problem: Education: Goal: Knowledge of General Education information will improve Description: Including pain rating scale, medication(s)/side effects and non-pharmacologic comfort measures 04/25/2024 1025 by Shela Lapine, RN Outcome: Progressing 04/25/2024 0827 by Shela Lapine, RN Outcome: Progressing   Problem: Health Behavior/Discharge Planning: Goal: Ability to manage health-related needs will improve 04/25/2024 1025 by Shela Lapine, RN Outcome: Progressing 04/25/2024 0827 by Shela Lapine, RN Outcome: Progressing   Problem: Clinical Measurements: Goal: Ability to maintain clinical measurements within normal limits will improve 04/25/2024 1025 by Shela Lapine, RN Outcome: Progressing 04/25/2024 0827 by Shela Lapine, RN Outcome: Progressing Goal: Will remain free from infection 04/25/2024 1025 by Shela Lapine, RN Outcome: Progressing 04/25/2024 0827 by Shela Lapine, RN Outcome: Progressing Goal: Diagnostic test results will improve 04/25/2024 1025 by Shela Lapine, RN Outcome: Progressing 04/25/2024 0827 by Shela Lapine, RN Outcome: Progressing Goal: Respiratory complications will improve 04/25/2024 1025 by Shela Lapine, RN Outcome: Progressing 04/25/2024 0827 by Shela Lapine, RN Outcome: Progressing Goal: Cardiovascular complication will be avoided 04/25/2024 1025 by Shela Lapine, RN Outcome: Progressing 04/25/2024  0827 by Shela Lapine, RN Outcome: Progressing   Problem: Activity: Goal: Risk for activity intolerance will decrease 04/25/2024 1025 by Shela Lapine, RN Outcome: Progressing 04/25/2024 0827 by Shela Lapine, RN Outcome: Progressing   Problem: Nutrition: Goal: Adequate nutrition will be maintained 04/25/2024 1025 by Shela Lapine, RN Outcome: Progressing 04/25/2024 0827 by Shela Lapine, RN Outcome: Progressing   Problem: Coping: Goal: Level of anxiety will decrease 04/25/2024 1025 by  Shela Lapine, RN Outcome: Progressing 04/25/2024 0827 by Shela Lapine, RN Outcome: Progressing   Problem: Elimination: Goal: Will not experience complications related to bowel motility 04/25/2024 1025 by Shela Lapine, RN Outcome: Progressing 04/25/2024 0827 by Shela Lapine, RN Outcome: Progressing Goal: Will not experience complications related to urinary retention 04/25/2024 1025 by Shela Lapine, RN Outcome: Progressing 04/25/2024 0827 by Shela Lapine, RN Outcome: Progressing   Problem: Pain Managment: Goal: General experience of comfort will improve and/or be controlled 04/25/2024 1025 by Shela Lapine, RN Outcome: Progressing 04/25/2024 0827 by Shela Lapine, RN Outcome: Progressing   Problem: Safety: Goal: Ability to remain free from injury will improve 04/25/2024 1025 by Shela Lapine, RN Outcome: Progressing 04/25/2024 0827 by Shela Lapine, RN Outcome: Progressing   Problem: Skin Integrity: Goal: Risk for impaired skin integrity will decrease 04/25/2024 1025 by Shela Lapine, RN Outcome: Progressing 04/25/2024 0827 by Shela Lapine, RN Outcome: Progressing

## 2024-04-26 ENCOUNTER — Other Ambulatory Visit: Payer: Self-pay

## 2024-04-26 DIAGNOSIS — N179 Acute kidney failure, unspecified: Secondary | ICD-10-CM | POA: Diagnosis not present

## 2024-04-26 DIAGNOSIS — Z7189 Other specified counseling: Secondary | ICD-10-CM | POA: Diagnosis not present

## 2024-04-26 DIAGNOSIS — Z66 Do not resuscitate: Secondary | ICD-10-CM | POA: Diagnosis not present

## 2024-04-26 DIAGNOSIS — Z515 Encounter for palliative care: Secondary | ICD-10-CM | POA: Diagnosis not present

## 2024-04-26 LAB — RENAL FUNCTION PANEL
Albumin: 1.9 g/dL — ABNORMAL LOW (ref 3.5–5.0)
Anion gap: 12 (ref 5–15)
BUN: 73 mg/dL — ABNORMAL HIGH (ref 8–23)
CO2: 23 mmol/L (ref 22–32)
Calcium: 8.6 mg/dL — ABNORMAL LOW (ref 8.9–10.3)
Chloride: 117 mmol/L — ABNORMAL HIGH (ref 98–111)
Creatinine, Ser: 5.58 mg/dL — ABNORMAL HIGH (ref 0.61–1.24)
GFR, Estimated: 11 mL/min — ABNORMAL LOW (ref >60)
Glucose, Bld: 103 mg/dL — ABNORMAL HIGH (ref 70–99)
Phosphorus: 4.1 mg/dL (ref 2.5–4.6)
Potassium: 3 mmol/L — ABNORMAL LOW (ref 3.5–5.1)
Sodium: 152 mmol/L — ABNORMAL HIGH (ref 135–145)

## 2024-04-26 LAB — CBC WITH DIFFERENTIAL/PLATELET
Abs Immature Granulocytes: 0.05 K/uL (ref 0.00–0.07)
Basophils Absolute: 0 K/uL (ref 0.0–0.1)
Basophils Relative: 0 %
Eosinophils Absolute: 0 K/uL (ref 0.0–0.5)
Eosinophils Relative: 1 %
HCT: 25.6 % — ABNORMAL LOW (ref 39.0–52.0)
Hemoglobin: 7.7 g/dL — ABNORMAL LOW (ref 13.0–17.0)
Immature Granulocytes: 2 %
Lymphocytes Relative: 30 %
Lymphs Abs: 0.8 K/uL (ref 0.7–4.0)
MCH: 29.3 pg (ref 26.0–34.0)
MCHC: 30.1 g/dL (ref 30.0–36.0)
MCV: 97.3 fL (ref 80.0–100.0)
Monocytes Absolute: 0.4 K/uL (ref 0.1–1.0)
Monocytes Relative: 14 %
Neutro Abs: 1.4 K/uL — ABNORMAL LOW (ref 1.7–7.7)
Neutrophils Relative %: 53 %
Platelets: 111 K/uL — ABNORMAL LOW (ref 150–400)
RBC: 2.63 MIL/uL — ABNORMAL LOW (ref 4.22–5.81)
RDW: 16 % — ABNORMAL HIGH (ref 11.5–15.5)
Smear Review: NORMAL
WBC: 2.6 K/uL — ABNORMAL LOW (ref 4.0–10.5)
nRBC: 0 % (ref 0.0–0.2)

## 2024-04-26 LAB — GLUCOSE, CAPILLARY
Glucose-Capillary: 127 mg/dL — ABNORMAL HIGH (ref 70–99)
Glucose-Capillary: 103 mg/dL — ABNORMAL HIGH (ref 70–99)

## 2024-04-26 MED ORDER — HALOPERIDOL LACTATE 5 MG/ML IJ SOLN: 0.5000 mg | INTRAMUSCULAR | Status: DC | PRN

## 2024-04-26 MED ORDER — GLYCOPYRROLATE 0.2 MG/ML IJ SOLN: 0.2000 mg | INTRAMUSCULAR | Status: DC | PRN

## 2024-04-26 MED ORDER — POLYVINYL ALCOHOL 1.4 % OP SOLN: 1.0000 [drp] | Freq: Four times a day (QID) | OPHTHALMIC | Status: DC | PRN

## 2024-04-26 MED ORDER — POTASSIUM CHLORIDE 20 MEQ PO PACK: 40.0000 meq | PACK | Freq: Once | ORAL | Status: DC

## 2024-04-26 MED ORDER — HALOPERIDOL LACTATE 2 MG/ML PO CONC: 0.5000 mg | ORAL | Status: DC | PRN

## 2024-04-26 MED ORDER — POTASSIUM CHLORIDE 20 MEQ PO PACK
20.0000 meq | PACK | Freq: Once | ORAL | Status: AC
  Administered 2024-04-26: 20 meq via ORAL
  Filled 2024-04-26: qty 1

## 2024-04-26 MED ORDER — FENTANYL CITRATE PF 50 MCG/ML IJ SOSY: 12.5000 ug | PREFILLED_SYRINGE | INTRAMUSCULAR | Status: DC | PRN

## 2024-04-26 MED ORDER — HALOPERIDOL 1 MG PO TABS
0.5000 mg | ORAL_TABLET | ORAL | Status: DC | PRN
Start: 2024-04-26 — End: 2024-04-27

## 2024-04-26 MED ORDER — SODIUM CHLORIDE 0.9% FLUSH
3.0000 mL | Freq: Two times a day (BID) | INTRAVENOUS | Status: DC
  Administered 2024-04-26: 3 mL via INTRAVENOUS

## 2024-04-26 MED ORDER — LORAZEPAM 1 MG PO TABS
1.0000 mg | ORAL_TABLET | ORAL | Status: DC | PRN
Start: 2024-04-26 — End: 2024-04-27

## 2024-04-26 MED ORDER — LORAZEPAM 2 MG/ML PO CONC: 1.0000 mg | ORAL | Status: DC | PRN

## 2024-04-26 MED ORDER — POTASSIUM CHLORIDE CRYS ER 20 MEQ PO TBCR
40.0000 meq | EXTENDED_RELEASE_TABLET | Freq: Once | ORAL | Status: DC
  Filled 2024-04-26: qty 2

## 2024-04-26 MED ORDER — SODIUM CHLORIDE 0.9% FLUSH: 3.0000 mL | INTRAVENOUS | Status: DC | PRN

## 2024-04-26 MED ORDER — BIOTENE DRY MOUTH MT LIQD: 15.0000 mL | OROMUCOSAL | Status: DC | PRN

## 2024-04-26 MED ORDER — GLYCOPYRROLATE 1 MG PO TABS
1.0000 mg | ORAL_TABLET | ORAL | Status: DC | PRN
Start: 2024-04-26 — End: 2024-04-27

## 2024-04-26 MED ORDER — DIPHENHYDRAMINE HCL 50 MG/ML IJ SOLN: 12.5000 mg | INTRAMUSCULAR | Status: DC | PRN

## 2024-04-26 MED ORDER — SODIUM CHLORIDE 0.9 % IV SOLN: 250.0000 mL | INTRAVENOUS | Status: DC | PRN

## 2024-04-26 NOTE — Plan of Care (Signed)
  Problem: Tissue Perfusion: Goal: Adequacy of tissue perfusion will improve Outcome: Progressing   Problem: Education: Goal: Ability to describe self-care measures that may prevent or decrease complications (Diabetes Survival Skills Education) will improve Outcome: Progressing Goal: Individualized Educational Video(s) Outcome: Progressing   Problem: Coping: Goal: Ability to adjust to condition or change in health will improve Outcome: Progressing   Problem: Fluid Volume: Goal: Ability to maintain a balanced intake and output will improve Outcome: Progressing   Problem: Health Behavior/Discharge Planning: Goal: Ability to identify and utilize available resources and services will improve Outcome: Progressing Goal: Ability to manage health-related needs will improve Outcome: Progressing   Problem: Metabolic: Goal: Ability to maintain appropriate glucose levels will improve Outcome: Progressing   Problem: Nutritional: Goal: Maintenance of adequate nutrition will improve Outcome: Progressing Goal: Progress toward achieving an optimal weight will improve Outcome: Progressing   Problem: Skin Integrity: Goal: Risk for impaired skin integrity will decrease Outcome: Progressing   Problem: Tissue Perfusion: Goal: Adequacy of tissue perfusion will improve Outcome: Progressing   Problem: Education: Goal: Knowledge of General Education information will improve Description: Including pain rating scale, medication(s)/side effects and non-pharmacologic comfort measures Outcome: Progressing   Problem: Health Behavior/Discharge Planning: Goal: Ability to manage health-related needs will improve Outcome: Progressing   Problem: Clinical Measurements: Goal: Ability to maintain clinical measurements within normal limits will improve Outcome: Progressing Goal: Will remain free from infection Outcome: Progressing Goal: Diagnostic test results will improve Outcome: Progressing Goal:  Respiratory complications will improve Outcome: Progressing Goal: Cardiovascular complication will be avoided Outcome: Progressing   Problem: Activity: Goal: Risk for activity intolerance will decrease Outcome: Progressing   Problem: Nutrition: Goal: Adequate nutrition will be maintained Outcome: Progressing   Problem: Coping: Goal: Level of anxiety will decrease Outcome: Progressing   Problem: Elimination: Goal: Will not experience complications related to bowel motility Outcome: Progressing Goal: Will not experience complications related to urinary retention Outcome: Progressing   Problem: Pain Managment: Goal: General experience of comfort will improve and/or be controlled Outcome: Progressing   Problem: Safety: Goal: Ability to remain free from injury will improve Outcome: Progressing   Problem: Skin Integrity: Goal: Risk for impaired skin integrity will decrease Outcome: Progressing

## 2024-04-26 NOTE — Progress Notes (Signed)
 Brandon Peters is an 68 y.o. 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 Pt has CKD stage IV with baseline creatinine level around 2.4 and follows with a nephrologist at [facility].  He was admitted to [facility] 01/2024 for AKI on CKD when he was treated with IV fluid.  He was a started on chemotherapy FOLFOX on 04/09/2024 causing nausea, vomiting and unable to take food down.  He had lab done at the cancer center today which showed sodium of 154, potassium 3.8, CO2 14, BUN 108, creatinine 6.27, serum albumin 3.4, WBC 1.9, hemoglobin 8, platelet 82, started on D5 sodium bicarbonate  fluid, is not feeling well since starting chemotherapy.    Assessment/Plan: # Acute kidney injury on CKD stage IV in solitary kidney likely due to intravascular volume depletion/reduced renal perfusion caused by recent nausea vomiting, decreased oral intake due to chemotherapy.  Patient has only solitary kidney with poor renal reserve and history of obstructive uropathy. Kidney ultrasound LK no e/o obstruction.. PVR on 7/30  -UA suspicious for a UTI, urine sodium 33   -Stopped the sodium bicarbonate  fluid on 7/29, HCO3 25 + peripheral edema, likely third spacing.  Replete potassium as well.  -Strict ins and outs and daily lab. -Fortunately kidney function improves w/ hydration but -> 3rd spacing.  He is in a difficult situation as he requires hydration including free water but unfortunately has third spacing because of the malignancy and nutritional status.  We are moving towards home hospice.  Updated the sister who is bedside today.  Signing off at this time; please reconsult as needed.   Appreciate continued oncology or palliative care interaction to address goals of care given metastatic malignancy.   #  Hypernatremia with free water deficit of 2.6 L; D5W started at 100 cc an hour on 7/30 and also encouraged him to drink water.  Continue D5W for another 24hrs.  He is tolerating p.o.'s.:    # Anion gap metabolic acidosis: initially on hco3 -> off now   # UTI, many bacteria with 21-50 WBC's  # Pancytopenia due to recent chemotherapy, per primary and oncology team.  Subjective: Feeling better.  Mild shortness of breath but no chest pain.  Using the restroom often.  Sister bedside updated   Chemistry and CBC: Creatinine  Date/Time Value Ref Range Status  04/22/2024 12:15 PM 6.27 (H) 0.61 - 1.24 mg/dL Final  92/84/7974 88:64 AM 2.53 (H) 0.61 - 1.24 mg/dL Final  92/96/7974 98:57 PM 2.12 (H) 0.61 - 1.24 mg/dL Final  93/82/7974 89:78 AM 2.00 (H) 0.61 - 1.24 mg/dL Final  94/70/7974 88:76 AM 2.63 (H) 0.61 - 1.24 mg/dL Final  94/86/7974 88:93 AM 3.55 (H) 0.61 - 1.24 mg/dL Final  96/86/7974 91:87 AM 2.46 (H) 0.61 - 1.24 mg/dL Final  98/79/7974 89:92 AM 2.52 (H) 0.61 - 1.24 mg/dL Final  87/83/7975 89:85 AM 2.50 (H) 0.61 - 1.24 mg/dL Final  88/86/7975 89:92 AM 2.77 (H) 0.61 - 1.24 mg/dL Final  89/90/7975 98:94 PM 2.43 (H) 0.61 - 1.24 mg/dL Final  90/89/7975 90:66 AM 2.51 (H) 0.61 - 1.24 mg/dL Final  92/90/7975 90:70 AM 2.35 (H) 0.61 - 1.24 mg/dL Final  94/69/7975 91:47 AM 2.38 (H) 0.61 - 1.24 mg/dL Final  95/80/7975 89:97 AM 2.05 (H) 0.61 - 1.24 mg/dL Final  96/84/7975 89:86 AM  2.09 (H) 0.61 - 1.24 mg/dL Final  97/90/7975 89:94 AM 2.20 (H) 0.61 - 1.24 mg/dL Final  98/94/7975 91:57 AM 1.90 (H) 0.61 - 1.24 mg/dL Final  87/77/7976 92:41 AM 2.38 (H) 0.61 - 1.24 mg/dL Final  87/91/7976 89:81 AM 3.98 (HH) 0.61 - 1.24 mg/dL Final    Comment:    CRITICAL RESULT CALLED TO, READ BACK BY AND VERIFIED WITH: RBV CT KRISTY HELLAR @1056  09/02/22 VW   09/01/2022 12:56 PM 4.25 (HH) 0.61 - 1.24 mg/dL Final    Comment:    CRITICAL RESULT CALLED TO, READ BACK BY AND VERIFIED WITHBETHA GEOFM SALT, RN [address] [place]  09/01/22    08/04/2022 09:03 AM 2.56 (H) 0.61 - 1.24 mg/dL Final  90/91/7976 91:44 AM 1.60 (H) 0.61 - 1.24 mg/dL Final  91/81/7976 92:44 AM 1.58 (H) 0.61 - 1.24 mg/dL Final  92/71/7976 90:89 AM 1.64 (H) 0.61 - 1.24 mg/dL Final  92/92/7976 91:43 AM 1.98 (H) 0.61 - 1.24 mg/dL Final  93/83/7976 91:46 AM 1.76 (H) 0.61 - 1.24 mg/dL Final  94/73/7976 91:74 AM 1.62 (H) 0.61 - 1.24 mg/dL Final  94/80/7976 91:97 AM 1.75 (H) 0.61 - 1.24 mg/dL Final  94/95/7976 88:57 AM 1.70 (H) 0.61 - 1.24 mg/dL Final  95/71/7976 90:57 AM 1.61 (H) 0.61 - 1.24 mg/dL Final  95/78/7976 90:50 AM 3.13 (HH) 0.61 - 1.24 mg/dL Final    Comment:    CRITICAL RESULT CALLED TO, READ BACK BY AND VERIFIED WITH: S COWARD,RN 1048 01/14/2022 DBRADLEY   12/24/2021 10:03 AM 1.72 (H) 0.61 - 1.24 mg/dL Final  96/89/7976 91:62 AM 1.57 (H) 0.61 - 1.24 mg/dL Final  97/82/7976 91:71 AM 1.60 (H) 0.61 - 1.24 mg/dL Final  97/89/7976 98:65 PM 1.55 (H) 0.61 - 1.24 mg/dL Final  98/72/7976 89:64 AM 2.37 (H) 0.61 - 1.24 mg/dL Final  98/80/7976 90:66 AM 1.73 (H) 0.61 - 1.24 mg/dL Final  98/94/7976 90:61 AM 1.83 (H) 0.61 - 1.24 mg/dL Final  87/84/7977 87:45 PM 1.74 (H) 0.61 - 1.24 mg/dL Final  87/98/7977 98:88 PM 1.72 (H) 0.61 - 1.24 mg/dL Final  88/77/7977 89:85 AM 1.73 (H) 0.61 - 1.24 mg/dL Final  88/89/7977 87:40 PM 1.62 (H) 0.61 - 1.24 mg/dL Final  89/75/7977 98:60 PM 1.46 (H) 0.61 - 1.24 mg/dL Final  89/89/7977 89:95 AM 1.92 (H) 0.61 - 1.24 mg/dL Final  93/89/7977 89:71 AM 1.20 0.61 - 1.24 mg/dL Final  97/88/7977 90:48 AM 1.13 0.61 - 1.24 mg/dL Final  89/88/7978 90:46 AM 1.02 0.61 - 1.24 mg/dL Final  93/89/7978 91:66 AM 1.02 0.61 - 1.24 mg/dL Final  97/89/7978 90:57 AM 0.96 0.61 - 1.24 mg/dL Final  89/69/7979 91:53 AM 0.92 0.61 - 1.24 mg/dL Final  91/92/7979 91:58 AM 0.84 0.61 - 1.24 mg/dL Final  88/86/7981 89:44 AM 0.8 0.7 - 1.3 mg/dL Final  89/98/7981 89:94 AM 0.8 0.7 - 1.3 mg/dL Final  91/78/7981 90:62 AM 0.8 0.7 - 1.3 mg/dL Final   92/89/7981 90:82 AM 0.8 0.7 - 1.3 mg/dL Final  94/70/7981 88:47 AM 0.8 0.7 - 1.3 mg/dL Final  95/96/7981 88:73 AM 0.7 0.7 - 1.3 mg/dL Final  97/93/7981 88:86 AM 0.8 0.7 - 1.3 mg/dL Final  87/94/7982 91:40 AM 0.8 0.7 - 1.3 mg/dL Final  88/92/7982 88:80 AM 0.8 0.7 - 1.3 mg/dL Final  89/89/7982 89:83 AM 0.8 0.7 - 1.3 mg/dL Final  90/73/7982 88:99 AM 0.7 0.7 - 1.3 mg/dL Final  90/87/7982 90:45 AM 0.8 0.7 - 1.3 mg/dL Final  91/70/7982 88:81 AM 0.7 0.7 - 1.3 mg/dL  Final   Creat  Date/Time Value Ref Range Status  06/29/2011 08:52 AM 0.95 0.50 - 1.35 mg/dL Final    Comment:     Please note change in reference range(s).      Creatinine, Ser  Date/Time Value Ref Range Status  04/23/2024 09:15 AM 5.72 (H) 0.61 - 1.24 mg/dL Final  92/70/7974 92:42 AM 5.80 (H) 0.61 - 1.24 mg/dL Final  87/88/7976 89:52 AM 3.98 (H) 0.61 - 1.24 mg/dL Final  95/76/7976 93:97 AM 2.35 (H) 0.61 - 1.24 mg/dL Final  95/77/7976 94:56 AM 2.43 (H) 0.61 - 1.24 mg/dL Final   Recent Labs  Lab 04/22/24 1215 04/23/24 0757 04/23/24 0915 04/24/24 0313 04/25/24 0337 04/26/24 0415  NA 154* 150* 151* 155* 150* 152*  K 3.8 2.9* 2.8* 3.0* 3.1* 3.0*  CL 120* 116* 115* 121* 116* 117*  CO2 14* 24 25 24 24 23   GLUCOSE 233* 211* 246* 146* 311* 103*  BUN 108* 110* 106* 92* 80* 73*  CREATININE 6.27* 5.80* 5.72* 5.74* 5.39* 5.58*  CALCIUM  10.2 8.6* 8.5* 8.5* 8.2* 8.6*  PHOS  --   --   --   --  3.6 4.1   Recent Labs  Lab 04/23/24 0915 04/24/24 0313 04/25/24 0337 04/26/24 0415  WBC 1.0* 0.8* 1.4* 2.6*  NEUTROABS 0.3* 0.1* 0.6* 1.4*  HGB 6.5* 8.3* 7.9* 7.7*  HCT 20.6* 26.6* 26.0* 25.6*  MCV 93.6 94.0 95.9 97.3  PLT 74* 81* 95* 111*   Liver Function Tests: Recent Labs  Lab 04/22/24 1215 04/23/24 0757 04/25/24 0337 04/26/24 0415  AST 29 24  --   --   ALT 27 19  --   --   ALKPHOS 108 64  --   --   BILITOT 0.4 0.5  --   --   PROT 6.6 5.1*  --   --   ALBUMIN 3.4* 2.1* 1.9* 1.9*   No results for input(s):  LIPASE, AMYLASE in the last 168 hours. No results for input(s): AMMONIA in the last 168 hours. Cardiac Enzymes: No results for input(s): CKTOTAL, CKMB, CKMBINDEX, TROPONINI in the last 168 hours. Iron Studies: No results for input(s): IRON, TIBC, TRANSFERRIN, FERRITIN in the last 72 hours. PT/INR: @LABRCNTIP (inr:5)  Xrays/Other Studies: ) Results for orders placed or performed during the hospital encounter of 04/22/24 (from the past 48 hours)  Glucose, capillary     Status: Abnormal   Collection Time: 04/24/24 12:04 PM  Result Value Ref Range   Glucose-Capillary 177 (H) 70 - 99 mg/dL    Comment: Glucose reference range applies only to samples taken after fasting for at least 8 hours.  Glucose, capillary     Status: Abnormal   Collection Time: 04/24/24  3:57 PM  Result Value Ref Range   Glucose-Capillary 194 (H) 70 - 99 mg/dL    Comment: Glucose reference range applies only to samples taken after fasting for at least 8 hours.  Glucose, capillary     Status: Abnormal   Collection Time: 04/24/24  8:41 PM  Result Value Ref Range   Glucose-Capillary 182 (H) 70 - 99 mg/dL    Comment: Glucose reference range applies only to samples taken after fasting for at least 8 hours.  Renal function panel     Status: Abnormal   Collection Time: 04/25/24  3:37 AM  Result Value Ref Range   Sodium 150 (H) 135 - 145 mmol/L   Potassium 3.1 (L) 3.5 - 5.1 mmol/L   Chloride 116 (H) 98 - 111 mmol/L  CO2 24 22 - 32 mmol/L   Glucose, Bld 311 (H) 70 - 99 mg/dL    Comment: Glucose reference range applies only to samples taken after fasting for at least 8 hours.   BUN 80 (H) 8 - 23 mg/dL   Creatinine, Ser 4.60 (H) 0.61 - 1.24 mg/dL   Calcium  8.2 (L) 8.9 - 10.3 mg/dL   Phosphorus 3.6 2.5 - 4.6 mg/dL   Albumin 1.9 (L) 3.5 - 5.0 g/dL   GFR, Estimated 11 (L) >60 mL/min    Comment: (NOTE) Calculated using the CKD-EPI Creatinine Equation (2021)    Anion gap 10 5 - 15    Comment:  Performed at [facility], [address], [place], [place] [address]  CBC with Differential/Platelet     Status: Abnormal   Collection Time: 04/25/24  3:37 AM  Result Value Ref Range   WBC 1.4 (LL) 4.0 - 10.5 K/uL    Comment: REPEATED TO VERIFY CRITICAL VALUE NOTED.  VALUE IS CONSISTENT WITH PREVIOUSLY REPORTED AND CALLED VALUE.    RBC 2.71 (L) 4.22 - 5.81 MIL/uL   Hemoglobin 7.9 (L) 13.0 - 17.0 g/dL   HCT 73.9 (L) 60.9 - 47.9 %   MCV 95.9 80.0 - 100.0 fL   MCH 29.2 26.0 - 34.0 pg   MCHC 30.4 30.0 - 36.0 g/dL   RDW 83.7 (H) 88.4 - 84.4 %   Platelets 95 (L) 150 - 400 K/uL    Comment: REPEATED TO VERIFY Immature Platelet Fraction may be clinically indicated, consider ordering this additional test [id number]    nRBC 0.0 0.0 - 0.2 %   Neutrophils Relative % 44 %   Neutro Abs 0.6 (L) 1.7 - 7.7 K/uL   Lymphocytes Relative 41 %   Lymphs Abs 0.6 (L) 0.7 - 4.0 K/uL   Monocytes Relative 14 %   Monocytes Absolute 0.2 0.1 - 1.0 K/uL   Eosinophils Relative 1 %   Eosinophils Absolute 0.0 0.0 - 0.5 K/uL   Basophils Relative 0 %   Basophils Absolute 0.0 0.0 - 0.1 K/uL   WBC Morphology MORPHOLOGY UNREMARKABLE    RBC Morphology MORPHOLOGY UNREMARKABLE    Smear Review See Note     Comment: PLATELETS APPEAR DECREASED   Immature Granulocytes 0 %   Abs Immature Granulocytes 0.00 0.00 - 0.07 K/uL    Comment: Performed at [facility], [address], [place], [place] [address]  Glucose, capillary     Status: Abnormal   Collection Time: 04/25/24  7:03 AM  Result Value Ref Range   Glucose-Capillary 294 (H) 70 - 99 mg/dL    Comment: Glucose reference range applies only to samples taken after fasting for at least 8 hours.  Glucose, capillary     Status: Abnormal   Collection Time: 04/25/24 11:10 AM  Result Value Ref Range   Glucose-Capillary 265 (H) 70 - 99 mg/dL    Comment: Glucose reference range applies only to samples taken after fasting for at least 8 hours.  Glucose, capillary      Status: Abnormal   Collection Time: 04/25/24  9:27 PM  Result Value Ref Range   Glucose-Capillary 118 (H) 70 - 99 mg/dL    Comment: Glucose reference range applies only to samples taken after fasting for at least 8 hours.  Renal function panel     Status: Abnormal   Collection Time: 04/26/24  4:15 AM  Result Value Ref Range   Sodium 152 (H) 135 - 145 mmol/L  Potassium 3.0 (L) 3.5 - 5.1 mmol/L   Chloride 117 (H) 98 - 111 mmol/L   CO2 23 22 - 32 mmol/L   Glucose, Bld 103 (H) 70 - 99 mg/dL    Comment: Glucose reference range applies only to samples taken after fasting for at least 8 hours.   BUN 73 (H) 8 - 23 mg/dL   Creatinine, Ser 4.41 (H) 0.61 - 1.24 mg/dL   Calcium  8.6 (L) 8.9 - 10.3 mg/dL   Phosphorus 4.1 2.5 - 4.6 mg/dL   Albumin 1.9 (L) 3.5 - 5.0 g/dL   GFR, Estimated 11 (L) >60 mL/min    Comment: (NOTE) Calculated using the CKD-EPI Creatinine Equation (2021)    Anion gap 12 5 - 15    Comment: Performed at [facility], [address], [place], [place] [address]  CBC with Differential/Platelet     Status: Abnormal   Collection Time: 04/26/24  4:15 AM  Result Value Ref Range   WBC 2.6 (L) 4.0 - 10.5 K/uL   RBC 2.63 (L) 4.22 - 5.81 MIL/uL   Hemoglobin 7.7 (L) 13.0 - 17.0 g/dL   HCT 74.3 (L) 60.9 - 47.9 %   MCV 97.3 80.0 - 100.0 fL   MCH 29.3 26.0 - 34.0 pg   MCHC 30.1 30.0 - 36.0 g/dL   RDW 83.9 (H) 88.4 - 84.4 %   Platelets 111 (L) 150 - 400 K/uL   nRBC 0.0 0.0 - 0.2 %   Neutrophils Relative % 53 %   Neutro Abs 1.4 (L) 1.7 - 7.7 K/uL   Lymphocytes Relative 30 %   Lymphs Abs 0.8 0.7 - 4.0 K/uL   Monocytes Relative 14 %   Monocytes Absolute 0.4 0.1 - 1.0 K/uL   Eosinophils Relative 1 %   Eosinophils Absolute 0.0 0.0 - 0.5 K/uL   Basophils Relative 0 %   Basophils Absolute 0.0 0.0 - 0.1 K/uL   RBC Morphology MORPHOLOGY UNREMARKABLE    Smear Review Normal platelet morphology    Immature Granulocytes 2 %   Abs Immature Granulocytes 0.05 0.00 - 0.07 K/uL     Comment: Performed at [facility], [address], [place], [place] [address]   No results found.   PMH:   Past Medical History:  Diagnosis Date   ARF (acute renal failure) (HCC)    d/t lisinopril and/or diazide- was on both in a 2 week period- cr up to 4.99   Diabetes mellitus    Family history of lung cancer    Hyperlipidemia    Hypertension    Personal history of other malignant neoplasm of small intestine    RENAL CALCULUS, HX OF 06/26/2007    PSH:   Past Surgical History:  Procedure Laterality Date   COLONOSCOPY N/A 03/04/2016   Procedure: COLONOSCOPY;  Surgeon: Belvie Just, MD;  Location: [facility] ENDOSCOPY;  Service: Endoscopy;  Laterality: N/A;   ENTEROSCOPY N/A 03/04/2016   Procedure: ENTEROSCOPY;  Surgeon: Belvie Just, MD;  Location: [facility] ENDOSCOPY;  Service: Endoscopy;  Laterality: N/A;   IR IMAGING GUIDED PORT INSERTION  04/08/2024    Allergies:  Allergies  Allergen Reactions   Penicillins Other (See Comments)    Pt states that he cannot really remember reaction. Tentatively reports swelling/hives    Medications:   Prior to Admission medications   Medication Sig Start Date End Date Taking? Authorizing Provider  abiraterone  acetate (ZYTIGA ) 250 MG tablet TAKE 4 TABLETS BY MOUTH ONCE DAILY AS DIRECTED.  TAKE 1  HOUR BEFORE OR 2 HOURS AFTER A MEAL 11/23/23  Yes Cloretta Arley NOVAK, MD  amLODipine  (NORVASC ) 10 MG tablet Take 10 mg by mouth daily. 04/21/16  Yes [provider]  apixaban  (ELIQUIS ) 2.5 MG TABS tablet Take 1 tablet by mouth twice daily 04/04/24  Yes Cloretta Arley NOVAK, MD  docusate sodium  (COLACE) 100 MG capsule Take 100 mg by mouth daily as needed for mild constipation.   Yes [provider]  ferrous sulfate  325 (65 FE) MG EC tablet Take 1 tablet (325 mg total) by mouth 3 (three) times daily with meals. 11/06/19  Yes Cloretta Arley NOVAK, MD  lidocaine -prilocaine  (EMLA ) cream Apply 1 Application topically as needed (Apply to port 1-2 hours before its  use starting 2 weeks after it is placed). 04/01/24  Yes Cloretta Arley NOVAK, MD  linagliptin (TRADJENTA) 5 MG TABS tablet Take 1 tablet by mouth daily. 02/17/24 04/22/24 Yes [provider]  loperamide  (IMODIUM ) 2 MG capsule Take 2 mg by mouth daily as needed for diarrhea or loose stools.   Yes [provider]  ondansetron  (ZOFRAN ) 8 MG tablet Take 1 tablet (8 mg total) by mouth every 8 (eight) hours as needed (may use 3 days after chemo as needed for nausea). 04/01/24  Yes Cloretta Arley NOVAK, MD  predniSONE  (DELTASONE ) 5 MG tablet Take 1 tablet by mouth once daily with breakfast 03/11/24  Yes Cloretta Arley NOVAK, MD  prochlorperazine  (COMPAZINE ) 10 MG tablet Take 1 tablet (10 mg total) by mouth every 6 (six) hours as needed for nausea or vomiting. 04/01/24  Yes Cloretta Arley NOVAK, MD  rosuvastatin  (CRESTOR ) 10 MG tablet Take 10 mg by mouth every morning. 02/23/21  Yes [provider]  tamsulosin  (FLOMAX ) 0.4 MG CAPS capsule Take 0.4 mg by mouth at bedtime. 05/02/16  Yes [provider]    Discontinued Meds:   Medications Discontinued During This Encounter  Medication Reason   torsemide (DEMADEX) 10 MG tablet    empagliflozin (JARDIANCE) 10 MG TABS tablet Discontinued by provider   sodium bicarbonate  150 mEq in dextrose  5 % 1,150 mL infusion    insulin  aspart (novoLOG ) injection 0-5 Units    insulin  aspart (novoLOG ) injection 0-9 Units    potassium chloride  SA (KLOR-CON  M) CR tablet 40 mEq    potassium chloride  (KLOR-CON ) packet 40 mEq     Social History:  reports that he has never smoked. He has never used smokeless tobacco. He reports that he does not drink alcohol and does not use drugs.  Family History:   Family History  Problem Relation Age of Onset   Cancer Mother    Diabetes Mother    Hypertension Mother    Coronary artery disease Mother    Hyperlipidemia Sister    Hypertension Sister     Blood pressure 105/65, pulse 90, temperature 98.2 F (36.8 C), resp.  rate 18, height 5' 5 (1.651 m), weight 67 kg, SpO2 98%. Physical Exam: General exam: Appears comfortable  Respiratory system: CTA b/l, normal rate Cardiovascular system: S1 & S2 heard, RRR.  Bilateral pedal edema+. Gastrointestinal system: Abdomen SNDNT+BS Central nervous system: Alert and oriented. No focal neurological deficits. Extremities: Bilateral lower extremities pitting edema present, no cyanosis. Skin: No rashes, lesions or ulcers Psychiatry: Judgement and insight appear normal. Mood & affect appropriate.      MELIA LYNWOOD ORN, MD 04/26/2024, 10:25 AM

## 2024-04-26 NOTE — Progress Notes (Signed)
 Occupational Therapy Treatment Patient Details Name: Brandon Peters MRN: [id number] DOB: [birth year] Today's Date: 04/26/2024   History of present illness Pt is a 68 y/o male admitted from cancer center secondary to nausea and vomiting. Admitted with AKI on chronic kidney disease stage IV, with high anion gap anion gap metabolic acidosis. 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 Seen for skilled OT treatment session with pts. Family member and family friend present.  Able to complete LB dressing and stand pivot for 3n1 transfer with CGA.  Provided education and strategies for BLE edema management. Pt. And family member receptive and taking notes to follow along.  Agree with d/c recommendations.  Cont. With acute OT POC.         If plan is discharge home, recommend the following:  A little help with walking and/or transfers;A little help with bathing/dressing/bathroom;Assistance with cooking/housework;Assist for transportation;Help with stairs or ramp for entrance   Equipment Recommendations       Recommendations for Other Services      Precautions / Restrictions Precautions Precautions: Fall Recall of Precautions/Restrictions: Intact Precaution/Restrictions Comments: Implanted port       Mobility Bed Mobility               General bed mobility comments: not assessed as patient sitting up on arrival and post session    Transfers Overall transfer level: Needs assistance   Transfers: Sit to/from Stand, Bed to chair/wheelchair/BSC Sit to Stand: Contact guard assist      Step pivot transfers: Contact guard assist     General transfer comment: stand pivot to from recliner/3n1 CGA. good knowledge of hand placement     Balance                                           ADL either performed or assessed with clinical judgement   ADL Overall ADL's : Needs assistance/impaired             Lower Body Bathing: Contact guard assist;Sitting/lateral leans Lower Body Bathing Details (indicate cue type and reason): figure 4 with pt. assisting each LE over knee to reach foot.         Toilet Transfer: Contact guard assist;BSC/3in1;Stand-pivot;Cueing for sequencing Statistician Details (indicate cue type and reason): recliner to/from 3n1         Functional mobility during ADLs: Contact guard assist General ADL Comments: edcuation on edema management for BLEs.  reviewed checking socks for any wrinkles that could increase indentions or issues with skin.  also about having feet elevated he states my legs fall asleep reviewed chaning position and going from recliner position to seated postion along with weight shifting.  family member present and was taking notes so they could remember    Extremity/Trunk Assessment              Vision       Perception     Praxis     Communication Communication Communication: No apparent difficulties   Cognition Arousal:  Alert Behavior During Therapy: [facility] for tasks assessed/performed Cognition: No apparent impairments                               Following commands: Intact        Cueing   Cueing Techniques: Verbal cues  Exercises      Shoulder Instructions       General Comments maximal assistance for pericare after bowel movement. mild incontinence    Pertinent Vitals/ Pain       Pain Assessment Pain Assessment: Faces Faces Pain Scale: Hurts little more Pain Location: bilateral feet Pain Descriptors / Indicators: Sore Pain Intervention(s): Limited activity  within patient's tolerance, Monitored during session, Repositioned  Home Living                                          Prior Functioning/Environment              Frequency  Min 2X/week        Progress Toward Goals  OT Goals(current goals can now be found in the care plan section)  Progress towards OT goals: Progressing toward goals     Plan      Co-evaluation                 AM-PAC OT 6 Clicks Daily Activity     Outcome Measure   Help from another person eating meals?: None Help from another person taking care of personal grooming?: A Little Help from another person toileting, which includes using toliet, bedpan, or urinal?: A Little Help from another person bathing (including washing, rinsing, drying)?: A Little Help from another person to put on and taking off regular upper body clothing?: A Little Help from another person to put on and taking off regular lower body clothing?: A Little 6 Click Score: 19    End of Session    OT Visit Diagnosis: Unsteadiness on feet (R26.81);Other abnormalities of gait and mobility (R26.89);Muscle weakness (generalized) (M62.81)   Activity Tolerance Patient tolerated treatment well   Patient Left in chair;with call bell/phone within reach;with family/visitor present   Nurse Communication Other (comment) (secure chatted session details, reviewing recommendations provided to pt. and family member for edema and pain management of BLEs)        Time: 8861-8847 OT Time Calculation (min): 14 min  Charges: OT General Charges $OT Visit: 1 Visit OT Treatments $Self Care/Home Management : 8-22 mins  Randall, COTA/L Acute Rehabilitation [phone]   CHRISTELLA Nest Lorraine-COTA/L  04/26/2024, 2:11 PM

## 2024-04-26 NOTE — Progress Notes (Signed)
   Pt was referred to hospice care for services at home. I have reviewed the pt's chart and pt along with his sister at bedside were in agreement with hospice care and home services. I have ordered a rollator and transport chair for the pt. He refused hospital bed and oxygen at this time.  We will plan to do an intake visit at d/c from hospital. Our MD has approved the pt for hospice care at home. Magdalena Berber RN [phone]

## 2024-04-26 NOTE — Progress Notes (Signed)
 Daily Progress Note   Date: 04/26/2024   Patient Name: Brandon Peters  DOB: 07-05-1956  MRN: [id number]  Age / Sex: 68 y.o., male  Attending Physician: Odell Castor, Erle, MD Primary Care Physician: Bari Morgans, NP Admit Date: 04/22/2024 Length of Stay: 4 days  Reason for Follow-up: Establishing goals of care  Past Medical History:  Diagnosis Date   ARF (acute renal failure) (HCC)    d/t lisinopril and/or diazide- was on both in a 2 week period- cr up to 4.99   Diabetes mellitus    Family history of lung cancer    Hyperlipidemia    Hypertension    Personal history of other malignant neoplasm of small intestine    RENAL CALCULUS, HX OF 06/26/2007    Subjective:   Subjective: Chart Reviewed. Updates received. Patient Assessed. Created space and opportunity for patient  and family to explore thoughts and feelings regarding current medical situation.  Today's Discussion: Today before meeting with the patient/family, I reviewed the chart including hospice note from today, nephrology note from today, nursing notes, PT OT note from today, later on in oncology note from today.  I also reviewed vital signs, med ministration record, nursing flowsheets.  I reviewed labs including renal function panel from today showing slight bump in sodium to 152, stable hypokalemia at 3.0, slight worsening of creatinine from 5.39 yesterday to 5.58 today with GFR calculated at 11.  CBC shows some improvement in white blood cell count from 1.4-2.6 and ANC from 0.6-1.4.  Hemoglobin stable but remains low at 7.7, platelets low but improved from 95 yesterday to 111 today. Overall remains pancytopenic from chemotherapy.  Today saw the patient at bedside, also present was his sister Montie.  He is having some discomfort in his feet likely from swelling.  He describes poor intake today.  The food is just not good and hard for him to swallow.  They are bringing boost breeze drinks but he is not drinking much that.   Less than what he had today is water.  He describes continued poor appetite.  We spent time talking about his decision for DNR yesterday.  We also spent time talking about his kidney function and how it is slightly worsened today.  He understands that if his kidney function does not improve he would not be offered chemotherapy and it might be difficult to get out of the hospital.  He states he had a good conversation with oncology today.  He states that I do not think I will do chemotherapy again and when asked him to elaborate he does not feel his kidneys will improve.  We spent time talking about if his kidneys do not improve and chemotherapy is not an option then we should be talking about comfort care and hospice care. I explained comfort care as care where the patient would no longer receive aggressive medical interventions such as continuous vital signs, lab work, radiology testing, or medications not focused on comfort, peace, and dignity. This includes stopping antibiotics and weaning oxygen to room air, as these are generally not accepted as providing comfort but only prolonging the dying process artificially. All care would focus on how the patient is looking and feeling. This would include management of any symptoms that may cause discomfort, pain, shortness of breath/air hunger, increased work of breathing, cough, nausea, agitation/restlessness, anxiety, and/or secretions etc. Symptoms would be managed with medications and other non-pharmacological interventions such as spiritual support if requested, repositioning, music therapy, or therapeutic listening. Family  verbalized understanding.   I described hospice as a service for patients who have a life expectancy of 6 months or less. The goal of hospice is the preservation of dignity and quality at the end phases of life. Under hospice care, the focus changes from curative to symptom relief. I explained the three setting where hospice services  can be provided including the home, at a living facility (such as LTC SNF, Assisted Living, etc), and a hospice facility. I explained that acceptance to hospice in any specific location is the final decision of the hospice medical director and bed availability, if applicable. They verbalized understanding.  After discussing these we spent time weighing options and after our conversation patient has elected to transition to comfort care today and engage with [facility] [place] for home hospice services.  I spent time answering multiple questions from patient's family about hospice services.  I shared that a hospice liaison would likely engage with them today either in person or by phone to explain services further.  I shared that I would discuss with the medical team and modify orders, as described above, for transition to comfort care.  They are in agreement.  I shared that I would return tomorrow to follow-up with patient and family. I provided emotional and general support through therapeutic listening, empathy, sharing of stories, and other techniques. I answered all questions and addressed all concerns to the best of my ability.  Review of Systems  Constitutional:        Lower extremity discomfort  Respiratory:  Negative for shortness of breath.   Cardiovascular:  Positive for leg swelling.  Gastrointestinal:  Negative for abdominal pain, nausea and vomiting.    Objective:   Primary Diagnoses: Present on Admission:  AKI (acute kidney injury) (HCC)  Prostatic cancer (HCC)  Essential hypertension  Stage 3a chronic kidney disease (CKD) (HCC)  Chronic pulmonary embolism (HCC)  Pancytopenia (HCC)  Small bowel cancer (HCC)   Vital Signs:  BP 105/65 (BP Location: Left Arm)   Pulse 90   Temp 98.2 F (36.8 C)   Resp 18   Ht 5' 5 (1.651 m)   Wt 67 kg   SpO2 98%   BMI 24.58 kg/m   Physical Exam Vitals and nursing note reviewed.  Constitutional:      General: He is not in  acute distress.    Appearance: He is ill-appearing.  HENT:     Head: Normocephalic and atraumatic.  Cardiovascular:     Rate and Rhythm: Normal rate.  Pulmonary:     Effort: Pulmonary effort is normal. No respiratory distress.  Abdominal:     General: Abdomen is flat.  Musculoskeletal:     Right lower leg: Edema present.     Left lower leg: Edema present.  Skin:    General: Skin is warm and dry.  Neurological:     General: No focal deficit present.     Mental Status: He is alert and oriented to person, place, and time.  Psychiatric:        Mood and Affect: Mood normal.        Behavior: Behavior normal.     Palliative Assessment/Data: 60%   Advanced Care Planning:   Existing Vynca/ACP Documentation: Goals of care document signed 04/22/2024  Primary Decision Maker: PATIENT  Pertinent diagnosis: Metastatic small bowel carcinoma, metastatic prostate cancer, severe/intractable nausea and vomiting, severe pancytopenia, AKI  The patient and/or family consented to a voluntary Advance Care Planning Conversation in person. Individuals  present for the conversation: The patient, his sister Montie, this NP.  Summary of the conversation: Discussed options moving forward given current clinical picture.  After an extensive discussion about various options the patient has elected to transition to comfort care and engage with hospice for home hospice services.  Outcome of the conversations and/or documents completed: Changed to DNR-comfort, transition to comfort care, engage with [facility] [place] for home hospice services.  I spent 16 minutes providing separately identifiable ACP services with the patient and/or surrogate decision maker in a voluntary, in-person conversation discussing the patient's wishes and goals as detailed in the above note.  Assessment & Plan:   HPI/Patient Profile:  68 y.o. 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  Sent from the cancer center on the day of admission for sodium of 154 bicarb of 14 creatinine of 6.2, white blood cell count of 1.9 hemoglobin of 8 platelet count of 82 transferred to [facility]. He was admitted on 04/22/2024 with AKI on CKD stage IV, AGMA, hypernatremia, intractable nausea and vomiting, pancytopenia/neutropenia, metastatic small bowel cancer currently on chemo, metastatic prostate cancer, and others.    Palliative medicine was consulted for GOC conversations.  SUMMARY OF RECOMMENDATIONS   Changed to DNR-comfort Transition to comfort care [facility] consult for referral to hospice to be more for home hospice services See symptom management orders below Palliative medicine will continue to follow while inpatient  Symptom Management:  Tylenol  650 mg PR every 6 hours as needed mild pain (1-3) or fever Biotene oral rinse 15 mL topical as needed dry mouth Artificial tears 1 drop OU 4 times daily as needed dry eyes Diphenhydramine 12.5 mg IV every 4 hours as needed itching Fentanyl  12.525 mcg IV every 2 hours as needed severe pain (7-10) Robinul 0.2 mg IV every 4 hours as needed excessive secretions Haldol 0.5 mg IV every 4 hours as needed agitation or delirium Ativan 1 mg p.o. four 1 mg sublingual every 4 hours as needed anxiety Reglan  10 mg IV every 6 hours as needed refractory nausea/vomiting Zofran  4 mg IV every 6 hours as needed nausea Oxycodone  5 mg p.o. every 4 hours as needed moderate pain (4-6)  Code Status: DNR-comfort  Prognosis: < 6 months  Discharge Planning: Home  with Hospice  Discussed with: Patient, family, medical team, nursing team, [facility] team, hospice liaison  Thank you for allowing us  to participate in the care of Javarie Crisp PMT will continue to support holistically.  Billing based on MDM: High  Problems Addressed: One or more chronic illnesses with severe exacerbation, progression, or side effects of treatment.  Risks: Decision not to resuscitate or to de-escalate care because of poor prognosis  Detailed review of medical records (labs, imaging, vital signs), medically appropriate exam, discussed with treatment team, counseling and education to patient, family, & staff, documenting clinical information, medication management, coordination of care  Camellia Kays, NP Palliative Medicine Team  Team Phone # [phone] (Nights/Weekends)  05/25/2021, 8:17 AM

## 2024-04-26 NOTE — TOC Progression Note (Signed)
 Transition of Care [facility]) - Progression Note    Patient Details  Name: [name] MRN: [id number] Date of Birth: 1955/12/20  Transition of [facility]) CM/SW Contact  Tom-Johnson, Caridad Silveira Daphne, RN Phone Number: 04/26/2024, 2:48 PM  Clinical Narrative:     CM consulted for Home with hospice. Patient and family chose [facility] [place]. Referral called in to Magdalena Berber wit acceptance noted, info on AVS. Cheri will contact patient and family and update CM with needs.   CM will continue to follow as patient progresses with care towards discharge.                       Expected Discharge Plan and Services                                               Social Drivers of Health (SDOH) Interventions SDOH Screenings   Food Insecurity: No Food Insecurity (04/22/2024)  Housing: Low Risk  (04/22/2024)  Transportation Needs: No Transportation Needs (04/22/2024)  Utilities: Not At Risk (04/22/2024)  Depression (PHQ2-9): Low Risk  (04/09/2024)  Financial Resource Strain: Low Risk  (07/21/2022)   Received from [facility]  Social Connections: Patient Declined (04/22/2024)  Tobacco Use: Low Risk  (04/25/2024)    Readmission Risk Interventions    04/24/2024   11:11 AM  Readmission Risk Prevention Plan  Transportation Screening Complete  PCP or Specialist Appt within 5-7 Days Complete  Home Care Screening Complete  Medication Review (RN CM) Referral to Pharmacy

## 2024-04-26 NOTE — Progress Notes (Signed)
 IP PROGRESS NOTE  Subjective:   [name] reports no pain or nausea at this morning.  He vomited spaghetti last night.  No bleeding.  He is having bowel movements.  [name] sister was present by telephone for today's visit.  Objective: Vital signs in last 24 hours: Blood pressure 105/65, pulse 90, temperature 98.2 F (36.8 C), resp. rate 18, height 5' 5 (1.651 m), weight 147 lb 11.3 oz (67 kg), SpO2 98%.  Intake/Output from previous day: No intake/output data recorded.  Physical Exam:  HEENT: No thrush Lungs: Decreased breath sounds and inspiratory rhonchi at the lower posterior chest bilaterally, no respiratory distress Cardiac: Regular rate and rhythm Abdomen: Mildly distended, nontender, no mass Extremities: 1+ pitting edema to lower leg and foot bilaterally   Portacath/PICC-without erythema  Lab Results: Recent Labs    04/25/24 0337 04/26/24 0415  WBC 1.4* 2.6*  HGB 7.9* 7.7*  HCT 26.0* 25.6*  PLT 95* 111*    BMET Recent Labs    04/25/24 0337 04/26/24 0415  NA 150* 152*  K 3.1* 3.0*  CL 116* 117*  CO2 24 23  GLUCOSE 311* 103*  BUN 80* 73*  CREATININE 5.39* 5.58*  CALCIUM  8.2* 8.6*    Lab Results  Component Value Date   CEA1 3.31 05/03/2019   CEA 4.56 04/09/2024    Studies/Results: No results found.   Medications: I have reviewed the patient's current medications.  Assessment/Plan: History of Severe anemia-status post a red cell transfusion 03/04/2016 2.. Prostate cancer Extensive sclerotic bone metastases noted on a CT of the abdomen/pelvis 03/04/2016 and MRI abdomen 03/05/2016 Markedly elevated PSA Lupron  initiated 03/15/2016; Casodex  14 days beginning 03/15/2016 Prostate biopsy 03/18/2016 05/09/2016 PSA significantly improved at 21 Abiraterone  initiated 05/10/2016 PSA less than 0.1 07/10/2018 PSA less than 0.1 08/30/2018 PSA less than 0.1 10/18/2018 PSA less than 0.1 11/29/2018 PSA less than 0.1 on 11/06/2019  PSA less than 0.1 on  03/05/2020 PSA less than 0.1 on 07/06/2020 PSA less than 0.1 08/17/2021 PSA less than 0.1 on 09/30/2021 PSA 0.1 on 11/12/2021 PSA 0.1 on 02/18/2022 PSA 0.2 on 12/09/2022 PSA 0.3 on 04/04/2023  PSA 0.4 on 06/06/2023 PSA 0.5 on 07/05/2023 PSA 0.7 on 08/09/2023 PSA 1.5 on 12/07/2023 PSA 4.7 on 02/06/2024 PSA 6.37 on 02/14/2024 PSA 5.0 on 02/22/2024 PSA 7.3 on 03/28/2024     3.   Symptoms of obstructive uropathy. Improved   4.   Distal duodenal and descending colon masses biopsy of the duodenal mass 03/04/2016 confirmed fragments of small bowel mucosa with focal high-grade dysplasia Biopsy of the descending colon polyps revealed fragments of a tubulovillous adenoma CT 08/29/2017-similar soft tissue thickening in the region of the splenic flexure of the colon suboptimally evaluated due to lack of colonic enteric contrast Referred to Dr. Rollin Referred to [facility] Resection of descending colon polyps 01/31/2018, tubulovillous adenomas with high-grade dysplasia Attempted endoscopic resection of the fourth segment duodenal polyp on 05/09/2018, firm unresectable lesion, biopsy revealed moderately differentiated adenocarcinoma CT abdomen/pelvis 05/25/2018- heterogeneously enhancing right lower pole renal lesion; diffuse sclerotic osseous metastatic lesions compatible with history of prostate cancer CT chest 05/25/2018- no pulmonary metastasis.  Diffuse osseous metastasis involving the axial and appendicular skeleton. 06/19/2018-sleeve resection of third and fourth portions of duodenum with primary end-to-end anastomosis, feeding gastrojejunostomy tube placement, Dr. Ames; poorly differentiated adenocarcinoma duodenum, 3 cm; tumor invades the muscularis propria; lymphovascular invasion present; negative margins; 4 of 11 involved lymph nodes; soft tissue deposits also seen in the mesentery; all mucosal margins and  soft tissue margins negative but 2 of the positive nodes are found in the soft tissue margin submitted for  frozen section (pT2 pN2); second mucosal lesion 1.2 cm from the proximal margin, tubular adenoma with focal severe dysplasia Intact expression of mismatch repair proteins by IHC; microsatellite stable Cycle 1 adjuvant Xeloda  07/23/2018 Cycle 2 adjuvant Xeloda  08/13/2018 Cycle 3 adjuvant Xeloda  09/10/2018 (dose reduced to 1000 mg twice daily for 14 days)  Cycle 4 adjuvant Xeloda  10/01/2018 (dose increased to 1500 mg every morning and 1000 mg every afternoon for 14 days) Cycle 5 adjuvant Xeloda  10/22/2018 Cycle 6 adjuvant Xeloda  11/12/2018 Cycle 7 adjuvant Xeloda  12/03/2018 Cycle 8 adjuvant Xeloda  12/24/2018   5.  Anorexia/weight loss- resolved   6.  Undescended testicles   7.  Right renal mass.  CT 08/29/2017-right renal mass involving the lower pole was partially calcified and does demonstrate enhancement following contrast most consistent with renal cell carcinoma.  Lesion not significantly enlarged compared with the prior CT. Followed by urology. 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 to the IVC/RA junction, upper normal size retrocaval node 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, nuclear grade 3 Axitinib /pembrolizumab  10/22/2021 Axitinib  placed on hold 11/05/2021 due to hand-foot syndrome Axitinib  resumed at a dose of 5 mg daily 11/12/2021, pembrolizumab  11/12/2021 Axitinib  discontinued 12/03/2021 secondary to progressive hand-foot syndrome, pembrolizumab   12/03/2021 Axitinib  resumed at a reduced dose of 2 mg twice daily after office visit 12/14/2021 Pembrolizumab  12/24/2021 Axitinib  stopped 01/03/2022 secondary to persistent hand/foot symptoms CT chest at [facility] 01/13/2022-no evidence of thoracic metastatic disease, similar diffuse sclerotic change in the skeleton MRI abdomen at [facility] 01/13/2022-similar size of right lower pole renal mass with renal vein thrombosis, decreased overall extent of thrombus now extending to the intrahepatic IVC, enhancement of right renal mass and thrombus is decreased, new left hydroureter ureter nephrosis with asymmetric perinephric stranding Pembrolizumab  01/27/2022 Axitinib  resumed 2 mg daily 01/28/2022 CT abdomen/pelvis at [facility] 02/10/2022-overall reduced size of right lower pole renal mass which demonstrates persistent region of enhancement along the posterior and lateral margin with extension into the renal sinus.  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  Query additional IVC thrombus at the intrahepatic IVC and extending into the right atrium. Axitinib  continued 2 mg daily, Pembrolizumab  every 3 weeks CT 05/05/2022 at [facility]-partially visualized pulmonary embolism involving segmental arteries of the right lower lobe.  Interval slight decrease in size of the mass arising from the lower pole the right kidney.  Mass demonstrates persistent moderate necrosis centrally.  Increased enhancing soft tissue component measuring up to 2 cm which is mildly increased in size compared to prior from 02/10/2022.  Bland tumor thrombus identified in the IVC at the level of the right renal vein ostium extending into the suprarenal IVC superiorly.  No obvious thrombus identified in the infrarenal, intrahepatic and suprahepatic IVC.  Likely trace bland thrombus in the anterior right renal vein.  Stable several subcentimeter retroperitoneal lymph nodes.  Diffuse osseous sclerotic and lytic metastatic disease stable compared to  prior. Axitinib  placed on  hold 05/04/2022 due to pain bilateral feet; 05/13/2022 he will continue to hold axitinib  Pembrolizumab  05/13/2022 Pembrolizumab  06/03/2022 Robotic right radical nephrectomy and IVC thrombectomy 06/27/2022-right kidney with tumor necrosis/hemorrhage,ypT0, adherent thrombus with cellular necrosis and no viable carcinoma present at the renal vein margin, other margins negative for carcinoma, 0/3 lymph nodes.  6.5 cm tumor including intraparenchymal lesion and thrombus in the renal vein 8.  Diabetes   9.  Anemia-stool Hemoccults + March 2021, referred to Dr. Rollin for endoscopic evaluation, anemia also secondary to metastatic prostate cancer, hypogonadism Small bowel enteroscopy 02/11/2020-esophagus normal.  Hematin found in the gastric body.  Examined duodenum normal.  No evidence of significant pathology in the entire examined portion of the jejunum.  There was no overt source of bleeding or inflammation.  The bleeding may be from small AVMs.  Surgical anastomosis was not visible with repeated inspection of the duodenum. Colonoscopy 02/11/2020-multiple sessile and semipedunculated polyps were found in the rectum, descending colon and ascending colon, 3 to 18 mm in size.  Polyps removed (polypectomy descending colon-fragments of tubular adenoma, inflammatory type polyp; polypectomy ascending colon-tubular adenoma, no high-grade dysplasia or malignancy; polypectomy descending colon-fragments of tubulovillous adenoma, no high-grade dysplasia or malignancy; polypectomy colon, rectum-tubular adenoma, no high-grade dysplasia or malignancy). Colonoscopy 02/02/2021-seven 2 to 10 mm polyps in the sigmoid colon (tubular adenoma), descending colon (fragments of tubulovillous adenoma, fragments of tubular adenoma ) and transverse colon (fragments of tubular adenoma). Progressive anemia 07/05/2021; stool cards positive Upper endoscopy/colonoscopy 09/23/2021-multiple polyps removed from the colon-tubular  adenomas, diffuse angioectasias in the stomach with bleeding-not amenable to endoscopic treatment 10.  Renal failure-progressive 01/17/2022, progressive following right nephrectomy October 2023 11.  CT chest at [facility] 01/13/2022-no evidence of thoracic metastatic disease, similar diffuse sclerotic changes in the skeleton 12.  CT renal stone study 01/14/2022-mild left side hydroureteronephrosis secondary to a left UVJ stone, left nephrolithiasis, new right middle lobe collapse/consolidation, edema/fluid at the left iliopsoas muscle 13.  PE noted on CT 05/05/2022-on apixaban .  He saw Dr. Darcy at [facility] 05/16/2022; instructions are to hold apixaban  72 hours prior to scheduled surgery and start Lovenox at prophylactic dosing. 14.  Metastatic small bowel carcinoma  CTs at [facility] 02/13/2024-new periaortic lymphadenopathy and peritoneal and omental carcinomatosis, small amount of new ascites, similar-appearing diffuse bony metastatic disease, focal thickening of the gastric body Omental biopsy 03/07/2024-metastatic moderately differentiated adenocarcinoma compatible with GI/intestinal primary; foundation 1 microsatellite stable, tumor mutation burden 1, ERBB2 amplification equivocal, NRAS Q61H; HER2 positive FISH CT 03/21/2024-similar abdominal retroperitoneal nodal metastasis.  1 node measuring minimally larger and the other minimally smaller.  Similar omental/peritoneal metastasis with mild increase in small volume abdominopelvic ascites.  Similar diffuse osseous metastasis.  New tiny left pleural effusion. Cycle 1 FOLFOX 04/09/2024  15.  Admission 04/22/2024 with nausea/vomiting and progressive renal failure  Mr Peters is now at day 18 following cycle 1 FOLFOX given for treatment of metastatic small bowel carcinoma.  He was admitted  with intractable nausea/vomiting and progressive renal failure.  The and vomiting began approximately 1 week following chemotherapy.  I think it is unlikely GI symptoms are related to  chemotherapy, but possible.  I think is more likely the nausea is related to carcinomatosis.  The CT Abdo/pelvis 04/23/2024 revealed increased ascites and persistent carcinomatosis.  No bowel obstruction.  He has developed delayed severe pancytopenia.  The neutropenia/thrombocytopenia are secondary to chemotherapy.  The anemia is secondary to chronic renal failure, metastatic prostate cancer involving the bone marrow,chemotherapy, and potentially GI bleeding.  The neutrophil count and platelets are higher.  The hemoglobin is stable.  I discussed goals of care with Brandon Peters again today.SABRA  He agrees to a no CODE BLUE status and hospice care.  Renal failure has not improved significantly since hospital admission.  He declines hemodialysis.    He would like to enter home hospice care.  Recommendations: Referral for home hospice care Management of renal failure and electrolytes per nephrology Regular diet as tolerated Please call oncology as needed, we will offer him an outpatient appointment at the Cancer center t      LOS: 4 days   Arley Hof, MD   04/26/2024, 2:22 PM

## 2024-04-26 NOTE — Progress Notes (Signed)
 Physical Therapy Treatment Patient Details Name: Taishawn Smaldone MRN: [id number] DOB: May 08, 1956 Today's Date: 04/26/2024   History of Present Illness Pt is a 68 y/o male admitted from cancer center secondary to nausea and vomiting. Admitted with AKI on chronic kidney disease stage IV, with high anion gap anion gap metabolic acidosis. PMH including but not limited to CKD stage IV (baseline of around 2.5), metastatic prostate cancer, renal cell carcinoma status post right nephrectomy in October 2023, metastatic small bowel carcinoma currently on chemotherapy with cycle 1 of FOLFOX on 04/09/2024, anemia of chronic disease, PE on Eliquis , hypertension, hyperlipidemia, diabetes mellitus type 2, admission at [facility] in 01/2024 for AKI on CKD stage IV treated with IV fluids    PT Comments  Patient agreeable to PT with supportive family member in the room. Patient performed multiple standing bouts with lifting assistance required. Initially he was agreeable to walking but unable to after fatigue from standing several times for pericare following bowel movement. He is fatigued with minimal activity but hopeful to return home soon. PT will continue to follow to maximize independence and decrease caregiver burden.    If plan is discharge home, recommend the following: A little help with bathing/dressing/bathroom;A little help with walking and/or transfers;Assistance with cooking/housework;Help with stairs or ramp for entrance   Can travel by private vehicle        Equipment Recommendations  Wheelchair (measurements PT);Wheelchair cushion (measurements PT);Rolling walker (2 wheels)    Recommendations for Other Services       Precautions / Restrictions Precautions Precautions: Fall Recall of Precautions/Restrictions: Intact Restrictions Weight Bearing Restrictions Per Provider Order: No     Mobility  Bed Mobility               General bed mobility comments: not assessed as patient sitting up on  arrival and post session    Transfers Overall transfer level: Needs assistance Equipment used: Rolling walker (2 wheels) Transfers: Bed to chair/wheelchair/BSC, Sit to/from Stand Sit to Stand: Mod assist, Min assist   Step pivot transfers: Contact guard assist       General transfer comment: several standing bouts performed. cues for hand placement and anterior weight shifting. increased independence with standing from bed side commode    Ambulation/Gait               General Gait Details: patient initially agreeable to walking, but unable to due to urgency of bowel movement with standing. patient fatigued after multiple sit to stands for pericare and deferred walking.   Stairs             Wheelchair Mobility     Tilt Bed    Modified Rankin (Stroke Patients Only)       Balance Overall balance assessment: Needs assistance Sitting-balance support: No upper extremity supported, Feet supported Sitting balance-Leahy Scale: Fair     Standing balance support: Bilateral upper extremity supported, During functional activity, Reliant on assistive device for balance Standing balance-Leahy Scale: Poor Standing balance comment: reliant on RW for support                            Communication Communication Communication: No apparent difficulties  Cognition Arousal: Alert Behavior During Therapy: WFL for tasks assessed/performed   PT - Cognitive impairments: No apparent impairments                         Following commands: Intact  Cueing Cueing Techniques: Verbal cues  Exercises      General Comments General comments (skin integrity, edema, etc.): maximal assistance for pericare after bowel movement. mild incontinence      Pertinent Vitals/Pain Pain Assessment Pain Assessment: No/denies pain    Home Living                          Prior Function            PT Goals (current goals can now be found in the  care plan section) Acute Rehab PT Goals Patient Stated Goal: to feel better PT Goal Formulation: With patient/family Time For Goal Achievement: 05/07/24 Potential to Achieve Goals: Good Progress towards PT goals: Progressing toward goals    Frequency    Min 2X/week      PT Plan      Co-evaluation              AM-PAC PT 6 Clicks Mobility   Outcome Measure  Help needed turning from your back to your side while in a flat bed without using bedrails?: A Little Help needed moving from lying on your back to sitting on the side of a flat bed without using bedrails?: A Little Help needed moving to and from a bed to a chair (including a wheelchair)?: A Little Help needed standing up from a chair using your arms (e.g., wheelchair or bedside chair)?: A Lot Help needed to walk in hospital room?: A Little Help needed climbing 3-5 steps with a railing? : A Lot 6 Click Score: 16    End of Session   Activity Tolerance: Patient tolerated treatment well;Patient limited by fatigue Patient left: in chair;with call bell/phone within reach;with family/visitor present Nurse Communication: Mobility status PT Visit Diagnosis: Other abnormalities of gait and mobility (R26.89)     Time: 8697-8676 PT Time Calculation (min) (ACUTE ONLY): 21 min  Charges:    $Therapeutic Activity: 8-22 mins PT General Charges $$ ACUTE PT VISIT: 1 Visit                     Randine Essex, PT, MPT    Randine LULLA Essex 04/26/2024, 1:32 PM

## 2024-04-26 NOTE — Progress Notes (Signed)
 [facility] PROGRESS NOTE    Progress Note  Brandon Peters  FMW:[id number] DOB: Sep 29, 1955 DOA: 04/22/2024 PCP: Bari Morgans, NP     Brief Narrative:   Brandon Peters is an 68 y.o. 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  Sent from the cancer center on the day of admission for sodium of 154 bicarb of 14 creatinine of 6.2, white blood cell count of 1.9 hemoglobin of 8 platelet count of 82 transferred to [facility].  Assessment/Plan:   AKI (acute kidney injury) (HCC) on chronic kidney disease stage IV, with high anion gap anion gap metabolic acidosis: On admission creatinine was 6.2, creatinine improved now slightly worsening. CT scan of the abdomen pelvis showed increase in size of malignant ascites, and perihepatic mass effect in the right hepatic lobe, with diffuse needle carcinomatosis.   Nephrology was consulted and was concerned of intractable nausea and vomiting. They are hopeful that his creatinine will improve no indication for hemodialysis at this point in time. Patient is currently DNR/DNI.  They met with palliative care. IV fluids were stopped as he is third spacing. Further management per nephrology. He related that if his kidneys are not can get better he would want to go home with hospice. I will have palliative Care meet with the patient again.  Hyponatremia/hypokalemia: IV fluids stopped. Sodium is worsening. Further management per nephrology.  Intractable nausea and vomiting: On Protonix  twice a day CT scan of the  abdomen pelvis unremarkable. Tolerating his diet will transition to a renal diet.  Pancytopenia/neutropenia: Likely due to chemotherapy no evidence of bleeding. Status post 2 units hemoglobin this morning 7.9. Oncology has signed off. continue CBCs daily and Rocephin  for fevers and neutropenia.  Metastatic small bowel cancer currently on chemotherapy: Last chemo 04/09/2024. Oncology on board.  Will discuss with oncology to see if he is candidate for granulocyte stimulating factor as his white blood cell count is trending down.  Controlled type 2 diabetes mellitus without complication, without long-term current use of insulin   Hemoglobin A1c of 7.2, blood glucose trending down significantly elevated add long-acting insulin  continue sliding scale sensitive.  Essential hypertension Off antihypertensive medication, relatively well-controlled.  Chronic pulmonary embolism (HCC) Holding Eliquis , will discuss with oncology to see if we can restart Eliquis . Currently on heparin  for DVT prophylaxis. Platelets are trending up.  Physical deconditioning: PT OT evaluated.  Goals of care: Overall very poor prognosis palliative care was been consulted and waiting recommendation. Agree with nephrology and we recommend hospice.   DVT prophylaxis: heparin  Family Communication:none Status is: Inpatient Remains inpatient appropriate because: Acute kidney injury    Code Status:     Code Status Orders  (From admission, onward)           Start     Ordered   04/22/24 1701  Full code  Continuous       Question:  By:  Answer:  Consent: discussion documented in EHR   04/22/24 1703           Code Status History     Date Active Date Inactive Code Status Order ID Comments User Context  04/08/2024 1622 04/09/2024 0510 Full Code [id number]  Vanice Sharper, MD HOV   03/07/2024 1126 03/08/2024 0509 Full Code [id number]  Jenna Cordella LABOR, MD HOV   01/14/2022 1843 01/16/2022 2020 Full Code  [id number]  Ricky Alfrieda DASEN, DO Inpatient   03/03/2016 1628 03/06/2016 1735 Full Code [id number]  Jeannetta Lonni ORN, MD ED         IV Access:   Peripheral IV   Procedures and diagnostic studies:   No results found.    Medical Consultants:   None.   Subjective:    [name] no complaints this morning.  Objective:    Vitals:   04/25/24 1538 04/25/24 1700 04/25/24 2129 04/26/24 0450  BP: 101/68  125/72 (!) 113/59  Pulse: 91  96 90  Resp:   18 18  Temp: 98 F (36.7 C)  98.3 F (36.8 C) 98.5 F (36.9 C)  TempSrc: Oral     SpO2: 100%  95% 98%  Weight:      Height:  5' 5 (1.651 m)     SpO2: 98 %   Intake/Output Summary (Last 24 hours) at 04/26/2024 0711 Last data filed at 04/25/2024 0804 Gross per 24 hour  Intake --  Output 0 ml  Net 0 ml   Filed Weights   04/23/24 0711 04/24/24 0407 04/25/24 0703  Weight: 66.5 kg 67.1 kg 67.4 kg    Exam: General exam: In no acute distress. Respiratory system: Good air movement and clear to auscultation. Cardiovascular system: S1 & S2 heard, RRR. No JVD. Gastrointestinal system: Abdomen is nondistended, soft and nontender.  Extremities: No pedal edema. Skin: No rashes, lesions or ulcers Psychiatry: Judgement and insight appear normal. Mood & affect appropriate.  Data Reviewed:    Labs: Basic Metabolic Panel: Recent Labs  Lab 04/23/24 0757 04/23/24 0915 04/24/24 0313 04/25/24 0337 04/26/24 0415  NA 150* 151* 155* 150* 152*  K 2.9* 2.8* 3.0* 3.1* 3.0*  CL 116* 115* 121* 116* 117*  CO2 24 25 24 24 23   GLUCOSE 211* 246* 146* 311* 103*  BUN 110* 106* 92* 80* 73*  CREATININE 5.80* 5.72* 5.74* 5.39* 5.58*  CALCIUM  8.6* 8.5* 8.5* 8.2* 8.6*  MG 2.4  --  2.2  --   --   PHOS  --   --   --  3.6 4.1   GFR Estimated Creatinine Clearance: 11.3 mL/min (A) (by C-G formula based on SCr of 5.58 mg/dL (H)). Liver Function Tests: Recent Labs  Lab 04/22/24 1215 04/23/24 0757 04/25/24 0337 04/26/24 0415  AST 29 24  --    --   ALT 27 19  --   --   ALKPHOS 108 64  --   --   BILITOT 0.4 0.5  --   --   PROT 6.6 5.1*  --   --   ALBUMIN 3.4* 2.1* 1.9* 1.9*   No results for input(s): LIPASE, AMYLASE in the last 168 hours. No results for input(s): AMMONIA in the last 168 hours. Coagulation profile No results for input(s): INR, PROTIME in the last 168 hours. COVID-19 Labs  No results for input(s): DDIMER, FERRITIN, LDH, CRP in the last 72 hours.  Lab Results  Component Value Date   SARSCOV2NAA NEGATIVE 01/14/2022    CBC: Recent Labs  Lab 04/22/24 1215 04/23/24 0915 04/24/24 0313 04/25/24 0337 04/26/24 0415  WBC 1.9* 1.0* 0.8* 1.4* 2.6*  NEUTROABS 1.1* 0.3* 0.1* 0.6* 1.4*  HGB 8.0* 6.5* 8.3* 7.9* 7.7*  HCT 26.1* 20.6* 26.6* 26.0* 25.6*  MCV 95.3 93.6 94.0 95.9 97.3  PLT 82* 74* 81* 95* 111*   Cardiac Enzymes: No results for input(s): CKTOTAL, CKMB, CKMBINDEX, TROPONINI in the last 168 hours. BNP (last 3 results) No results for input(s): PROBNP in the last 8760 hours. CBG: Recent Labs  Lab 04/24/24 1557 04/24/24 2041 04/25/24 0703 04/25/24 1110 04/25/24 2127  GLUCAP 194* 182* 294* 265* 118*   D-Dimer: No results for input(s): DDIMER in the last 72 hours. Hgb A1c: Recent Labs    04/23/24 0758  HGBA1C 7.2*   Lipid Profile: No results for input(s): CHOL, HDL, LDLCALC, TRIG, CHOLHDL, LDLDIRECT in the last 72 hours. Thyroid  function studies: No results for input(s): TSH, T4TOTAL, T3FREE, THYROIDAB in the last 72 hours.  Invalid input(s): FREET3 Anemia work up: No results for input(s): VITAMINB12, FOLATE, FERRITIN, TIBC, IRON, RETICCTPCT in the last 72 hours. Sepsis Labs: Recent Labs  Lab 04/23/24 0915 04/24/24 0313 04/25/24 0337 04/26/24 0415  WBC 1.0* 0.8* 1.4* 2.6*   Microbiology No results found for this or any previous visit (from the past 240 hours).   Medications:    sodium chloride    Intravenous  Once   Chlorhexidine  Gluconate Cloth  6 each Topical Daily   feeding supplement  1 Container Oral TID BM   heparin   5,000 Units Subcutaneous Q8H   insulin  aspart  0-5 Units Subcutaneous QHS   insulin  aspart  0-9 Units Subcutaneous TID WC   insulin  aspart  2 Units Subcutaneous TID WC   insulin  glargine-yfgn  5 Units Subcutaneous BID   melatonin  3 mg Oral QHS   pantoprazole  (PROTONIX ) IV  40 mg Intravenous Q12H   polyethylene glycol  17 g Oral BID   sodium chloride  flush  10-40 mL Intracatheter Q12H   Continuous Infusions:  cefTRIAXone  (ROCEPHIN )  IV 1 g (04/25/24 1542)      LOS: 4 days   Erle Odell Castor  [facility]  04/26/2024, 7:11 AM

## 2024-04-27 ENCOUNTER — Encounter: Payer: Self-pay | Admitting: Oncology

## 2024-04-27 ENCOUNTER — Other Ambulatory Visit (HOSPITAL_COMMUNITY): Payer: Self-pay

## 2024-04-27 DIAGNOSIS — E119 Type 2 diabetes mellitus without complications: Secondary | ICD-10-CM

## 2024-04-27 DIAGNOSIS — Z7189 Other specified counseling: Secondary | ICD-10-CM | POA: Diagnosis not present

## 2024-04-27 DIAGNOSIS — N170 Acute kidney failure with tubular necrosis: Secondary | ICD-10-CM | POA: Diagnosis not present

## 2024-04-27 DIAGNOSIS — N179 Acute kidney failure, unspecified: Secondary | ICD-10-CM | POA: Diagnosis not present

## 2024-04-27 DIAGNOSIS — C179 Malignant neoplasm of small intestine, unspecified: Secondary | ICD-10-CM | POA: Diagnosis not present

## 2024-04-27 DIAGNOSIS — I2782 Chronic pulmonary embolism: Secondary | ICD-10-CM

## 2024-04-27 DIAGNOSIS — Z66 Do not resuscitate: Secondary | ICD-10-CM | POA: Diagnosis not present

## 2024-04-27 DIAGNOSIS — Z515 Encounter for palliative care: Secondary | ICD-10-CM | POA: Diagnosis not present

## 2024-04-27 LAB — GLUCOSE, CAPILLARY: Glucose-Capillary: 75 mg/dL (ref 70–99)

## 2024-04-27 MED ORDER — HEPARIN SOD (PORK) LOCK FLUSH 100 UNIT/ML IV SOLN
500.0000 [IU] | Freq: Once | INTRAVENOUS | Status: AC
  Administered 2024-04-27: 500 [IU] via INTRAVENOUS
  Filled 2024-04-27: qty 5

## 2024-04-27 MED ORDER — OXYCODONE HCL 5 MG PO TABS
5.0000 mg | ORAL_TABLET | ORAL | 0 refills | Status: DC | PRN
  Filled 2024-04-27: qty 21, 4d supply, fill #0

## 2024-04-27 MED ORDER — ONDANSETRON 4 MG PO TBDP
4.0000 mg | ORAL_TABLET | Freq: Three times a day (TID) | ORAL | 0 refills | Status: DC | PRN
Start: 2024-04-27 — End: 2024-07-19
  Filled 2024-04-27: qty 20, 7d supply, fill #0

## 2024-04-27 MED ORDER — LORAZEPAM 1 MG PO TABS
1.0000 mg | ORAL_TABLET | ORAL | 0 refills | Status: DC | PRN
  Filled 2024-04-27: qty 5, 2d supply, fill #0

## 2024-04-27 NOTE — TOC Transition Note (Addendum)
 Transition of Care [facility]) - Discharge Note   Patient Details  Name: Brandon Peters MRN: [id number] Date of Birth: 1956-08-07  Transition of Care [facility]) CM/SW Contact:  Robynn Eileen Hoose, RN Phone Number: 04/27/2024, 8:31 AM   Clinical Narrative:     Patient is being discharged today. Cheri with [facility] made aware. Spoke with patient, confirmed that his sister will transport him home.  Final next level of care: Home w Hospice Care Barriers to Discharge: No Barriers Identified   Patient Goals and CMS Choice            Discharge Placement                       Discharge Plan and Services Additional resources added to the After Visit Summary for                                       Social Drivers of Health (SDOH) Interventions SDOH Screenings   Food Insecurity: No Food Insecurity (04/22/2024)  Housing: Low Risk  (04/22/2024)  Transportation Needs: No Transportation Needs (04/22/2024)  Utilities: Not At Risk (04/22/2024)  Depression (PHQ2-9): Low Risk  (04/09/2024)  Financial Resource Strain: Low Risk  (07/21/2022)   Received from [facility]  Social Connections: Patient Declined (04/22/2024)  Tobacco Use: Low Risk  (04/25/2024)     Readmission Risk Interventions    04/24/2024   11:11 AM  Readmission Risk Prevention Plan  Transportation Screening Complete  PCP or Specialist Appt within 5-7 Days Complete  Home Care Screening Complete  Medication Review (RN CM) Referral to Pharmacy

## 2024-04-27 NOTE — Progress Notes (Signed)
 Patient refused to take PO melatonin.  He states he is unable to swallow any pills d/t them getting stuck and  also vomiting afterwards.  He does not want to attempt with applesauce or for the tablet to be crushed.  Will continue to monitor patient.  Suzen Ice RN

## 2024-04-27 NOTE — Progress Notes (Signed)
 Daily Progress Note   Date: 04/27/2024   Patient Name: Brandon Peters  DOB: Nov 13, 1955  MRN: [id number]  Age / Sex: 68 y.o., male  Attending Physician: Odell Castor, Erle, MD Primary Care Physician: Bari Morgans, NP Admit Date: 04/22/2024 Length of Stay: 5 days  Reason for Follow-up: Establishing goals of care  Past Medical History:  Diagnosis Date   ARF (acute renal failure) (HCC)    d/t lisinopril and/or diazide- was on both in a 2 week period- cr up to 4.99   Diabetes mellitus    Family history of lung cancer    Hyperlipidemia    Hypertension    Personal history of other malignant neoplasm of small intestine    RENAL CALCULUS, HX OF 06/26/2007    Subjective:   Subjective: Chart Reviewed. Updates received. Patient Assessed. Created space and opportunity for patient  and family to explore thoughts and feelings regarding current medical situation.  Today's Discussion: Today before meeting with the patient/family, I reviewed the chart including hospice liaison note from yesterday, nursing note from today, TOC note from today, discharge summary from today.  I also reviewed vital signs, med ministration record, nursing flowsheets.  Patient was converted to comfort care yesterday.  Vitals show temperature 98.1, heart rate 93, respiratory rate 19, blood pressure 102/63, satting 100% on room air.  No labs because of comfort care status.  Today saw the patient at bedside, also present was his sister Montie.  He is sitting in the bedside chair, his sister was putting the bedside table near him to allow him to eat ice cream that he requested.  We discussed the good news that it appears he is being able to discharge home today with hospice services.  He is happy about this.  We discussed dietary restrictions which are no longer appropriate.  I encouraged him to eat what ever makes him happy.  I encouraged him to engage in what ever activities makes him happy.  We discussed availability of home  DME which will be provided by hospice.  Montie tells me thatr Cheri from [facility] [place] will be coming by shortly to the provide final papers and discussions prior to discharge.  I encouraged the patient to make the most out of what ever amount of time he has left and to maximize his quality of life is much as possible.  I wished him the best in his journey.  I provided emotional and general support through therapeutic listening, empathy, sharing of stories, and other techniques. I answered all questions and addressed all concerns to the best of my ability.  Review of Systems  Constitutional:  Positive for appetite change.  HENT:         Difficulty swallowing  Respiratory:  Negative for shortness of breath.   Cardiovascular:  Positive for leg swelling.  Gastrointestinal:  Negative for abdominal pain, nausea and vomiting.    Objective:   Primary Diagnoses: Present on Admission:  AKI (acute kidney injury) (HCC)  Prostatic cancer (HCC)  Essential hypertension  Stage 3a chronic kidney disease (CKD) (HCC)  Chronic pulmonary embolism (HCC)  Pancytopenia (HCC)  Small bowel cancer (HCC)   Vital Signs:  BP 102/63 (BP Location: Left Arm)   Pulse 93   Temp 98.1 F (36.7 C)   Resp 19   Ht 5' 5 (1.651 m)   Wt 67 kg   SpO2 100%   BMI 24.58 kg/m   Physical Exam Vitals and nursing note reviewed.  Constitutional:  General: He is not in acute distress.    Appearance: He is ill-appearing.  HENT:     Head: Normocephalic and atraumatic.  Cardiovascular:     Rate and Rhythm: Normal rate.  Pulmonary:     Effort: Pulmonary effort is normal. No respiratory distress.  Abdominal:     General: Abdomen is flat.  Musculoskeletal:     Right lower leg: Edema present.     Left lower leg: Edema present.  Skin:    General: Skin is warm and dry.  Neurological:     General: No focal deficit present.     Mental Status: He is alert and oriented to person, place, and time.   Psychiatric:        Mood and Affect: Mood normal.        Behavior: Behavior normal.     Palliative Assessment/Data: 60%   Assessment & Plan:   HPI/Patient Profile:  68 y.o. 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  Sent from the cancer center on the day of admission for sodium of 154 bicarb of 14 creatinine of 6.2, white blood cell count of 1.9 hemoglobin of 8 platelet count of 82 transferred to [facility]. He was admitted on 04/22/2024 with AKI on CKD stage IV, AGMA, hypernatremia, intractable nausea and vomiting, pancytopenia/neutropenia, metastatic small bowel cancer currently on chemo, metastatic prostate cancer, and others.    Palliative medicine was consulted for GOC conversations.  SUMMARY OF RECOMMENDATIONS   DNR-comfort Continue comfort care See symptom management orders below Anticipate discharge home with hospice today Palliative medicine will continue to follow while inpatient  Symptom Management:  Tylenol  650 mg PR every 6 hours as needed mild pain (1-3) or fever Biotene oral rinse 15 mL topical as needed dry mouth Artificial tears 1 drop OU 4 times daily as needed dry eyes Diphenhydramine  12.5 mg IV every 4 hours as needed itching Fentanyl  12.525 mcg IV every 2 hours as needed severe pain (7-10) Robinul  0.2 mg IV every 4 hours as needed excessive secretions Haldol  0.5 mg IV every 4 hours as needed agitation or delirium Ativan  1 mg p.o. four 1 mg sublingual every 4 hours as needed anxiety Reglan  10 mg IV every 6 hours as  needed refractory nausea/vomiting Zofran  4 mg IV every 6 hours as needed nausea Oxycodone  5 mg p.o. every 4 hours as needed moderate pain (4-6)  Code Status: DNR-comfort  Prognosis: < 6 months  Discharge Planning: Home with Hospice  Discussed with: Patient, family, medical team, nursing team, [facility] team, hospice liaison  Thank you for allowing us  to participate in the care of Zeek Rostron PMT will continue to support holistically.  Billing based on MDM: High  Problems Addressed: One or more chronic illnesses with severe exacerbation, progression, or side effects of treatment.  Risks: Decision not to resuscitate or to de-escalate care because of poor prognosis  Detailed review of medical records (labs, imaging, vital signs), medically appropriate exam, discussed with treatment team, counseling and education to patient, family, & staff, documenting clinical information, medication management, coordination of care  Camellia Kays, NP Palliative Medicine  Team  Team Phone # [phone] (Nights/Weekends)  05/25/2021, 8:17 AM

## 2024-04-27 NOTE — Progress Notes (Signed)
 Reviewed AVS and education with patient and sister.  Per [employer] catheter [employer] by IV team.  Bandaid to right port area intact no drainage.  TOC meds reviewed with patient and family by Hospice and D/C RN.  Per family DME arranged by Hospice RN to be delivered to home on today and 4pm.    Primary to call transport for discharge to Main Entrance.

## 2024-04-27 NOTE — Discharge Summary (Signed)
 Physician Discharge Summary  Brandon Peters FMW:[id number] DOB: [birth year] DOA: 04/22/2024  PCP: Bari Morgans, NP  Admit date: 04/22/2024 Discharge date: 04/27/2024  Admitted From: Home Disposition:  Home  Recommendations for Outpatient Follow-up:  He will go home with hospice  Home Health:No Equipment/Devices:None  Discharge Condition:Stable CODE STATUS:DNR Diet recommendation: Heart Healthy   Brief/Interim Summary: 68 y.o. 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  Sent from the cancer center on the day of admission for sodium of 154 bicarb of 14 creatinine of 6.2, white blood cell count of 1.9 hemoglobin of 8 platelet count of 82 transferred to [facility].   Discharge Diagnoses:  Principal Problem:   AKI (acute kidney injury) (HCC) Active Problems:   Prostatic cancer (HCC)   Controlled type 2 diabetes mellitus without complication, without long-term current use of insulin  (HCC)   Essential hypertension   Pancytopenia (HCC)   Stage 3a chronic kidney disease (CKD) (HCC)   Chronic pulmonary embolism (HCC)   Small bowel cancer (HCC)   Hypernatremia  ATN on chronic kidney disease stage IV with a high anion gap metabolic acidosis: Admission creatinine was 6.2 started on IV fluids and improved slowly. Then he started to deteriorate and his creatinine progressively got worse. Nephrology was consulted and he was manage conservatively.  He related he did not want any dialysis if needed. CT scan of the abdomen pelvis showed malignant ascites with  peritoneal carcinomatosis. As there were no improvement. Patient met with palliative care and  he decided to move towards comfort measures and he will go home with residential hospice.  Hyponatremia/hypokalemia: Intractable nausea and vomiting: Pancytopenia/neutropenia: Likely due to chemotherapy.  Metastatic small bowel cancer currently on chemotherapy: Noted.  Controlled diabetes mellitus type 2: Noted.  Essential hypertension: Off antihypertensive medication.  Chronic pulmonary embolism: Eliquis  was discontinued.  Physical deconditioning   Discharge Instructions  Discharge Instructions     Diet - low sodium heart healthy   Complete by: As directed    Increase activity slowly   Complete by: As directed       Allergies as of 04/27/2024       Reactions   Penicillins Other (See Comments)   Pt states that he cannot really remember reaction. Tentatively reports swelling/hives        Medication List     STOP taking these medications    abiraterone  acetate 250 MG tablet Commonly known as: Zytiga    amLODipine  10 MG tablet Commonly known as: NORVASC    docusate sodium  100 MG capsule Commonly known as: COLACE   Eliquis  2.5 MG Tabs tablet Generic drug: apixaban    ferrous sulfate  325 (65 FE) MG EC tablet   lidocaine -prilocaine  cream Commonly known as: EMLA    linagliptin 5 MG Tabs tablet Commonly known as: TRADJENTA   ondansetron  8 MG tablet Commonly known as: ZOFRAN    predniSONE  5 MG tablet Commonly known as: DELTASONE    prochlorperazine  10 MG tablet Commonly known as: COMPAZINE    rosuvastatin  10 MG tablet Commonly known as: CRESTOR        TAKE these medications    loperamide  2 MG capsule Commonly known as: IMODIUM   Take 2 mg by mouth daily as needed for diarrhea or loose stools.   LORazepam  1 MG tablet Commonly known as: ATIVAN  Take 1 tablet (1 mg total) by mouth every 4 (four) hours as needed for anxiety.   ondansetron  4 MG  disintegrating tablet Commonly known as: ZOFRAN -ODT Take 1 tablet (4 mg total) by mouth every 8 (eight) hours as needed for nausea or vomiting.   oxyCODONE  5 MG immediate release tablet Commonly known as: Oxy IR/ROXICODONE  Take 1 tablet (5 mg total) by mouth every 4 (four) hours as needed for moderate pain (pain score 4-6).   tamsulosin  0.4 MG Caps capsule Commonly known as: FLOMAX  Take 0.4 mg by mouth at bedtime.               Durable Medical Equipment  (From admission, onward)           Start     Ordered   04/24/24 1508  For home use only DME lightweight manual wheelchair with seat cushion  Once       Comments: Patient suffers from Weakness which impairs their ability to perform daily activities like dressing and grooming in the home.  A walker will not resolve  issue with performing activities of daily living. A wheelchair will allow patient to safely perform daily activities. Patient is not able to propel themselves in the home using a standard weight wheelchair due to general weakness. Patient can self propel in the lightweight wheelchair. Length of need 6 months . Accessories: elevating leg rests (ELRs), wheel locks, extensions and anti-tippers.   04/24/24 1508            Follow-up Information     [facility] Follow up.   Specialty: Home Health Services Why: Someone will call you to schedule first home visit. Contact information: [address] [place] [address] [phone]         [facility] Follow up.   Why: Someone will call you to schedule first home visit. Contact information: [address] [place]  [phone]               Allergies  Allergen Reactions   Penicillins Other (See Comments)    Pt states that he cannot really remember reaction. Tentatively reports swelling/hives    Consultations: Oncology Nephrology Palliative care   Procedures/Studies: CT  ABDOMEN PELVIS WO CONTRAST Result Date: 04/23/2024 CLINICAL DATA:  Abdominal pain, acute, nonlocalized EXAM: CT ABDOMEN AND PELVIS WITHOUT CONTRAST TECHNIQUE: Multidetector CT imaging of the abdomen and pelvis was performed following the standard protocol without IV contrast. RADIATION DOSE REDUCTION: This exam was performed according to the departmental dose-optimization program which includes automated exposure control, adjustment of the mA and/or kV according to patient size and/or use of iterative reconstruction technique. COMPARISON:  March 21, 2024 FINDINGS: Of note, the lack of intravenous contrast limits evaluation of the solid organ parenchyma and vascularity. Lower chest: Small bilateral pleural effusions with multifocal atelectasis in the lung bases. Mild cardiomegaly. Partially visualized chest port terminating in the right atrium. Hepatobiliary: No mass.No radiopaque stones or wall thickening of the gallbladder. No intrahepatic or extrahepatic biliary ductal dilation. Pancreas: No mass or main ductal dilation. No peripancreatic inflammation or fluid collection. Spleen: Normal size. No mass. Adrenals/Urinary Tract: No adrenal masses. Right nephrectomy. No left renal mass. No hydronephrosis or nephrolithiasis. The urinary bladder is distended without focal abnormality. Stomach/Bowel: The stomach is decompressed without focal abnormality. No small bowel wall  thickening or inflammation. No small bowel obstruction.Normal appendix. Vascular/Lymphatic: No aortic aneurysm. Diffuse aortoiliac atherosclerosis. Redemonstrated enlargement of the retroperitoneal lymphadenopathy, measuring 1.6 cm in the left periaortic region (previously, 1.4 cm). 1.2 cm mesenteric root lymph node is partially calcified, unchanged (axial 36). Reproductive: No prostatomegaly. Other: No pneumoperitoneum. Increasing intra-abdominal ascites, most notably in the perihepatic recesses, causing significant mass effect on the right hepatic  lobe. A couple of small regions of nodularity noted in the fluid collection posteriorly (for example, axial 23), measuring 1.6 cm. Extensive omental soft tissue nodularity again noted. Musculoskeletal: No acute fracture or destructive lesion. Diffuse sclerotic metastases throughout the visualized skeleton again noted. IMPRESSION: 1. Increasing volume of malignant ascites, most notably within the perihepatic recesses causing mass effect on the right hepatic lobe. Diffuse peritoneal carcinomatosis is similar in appearance with a couple of small nodules developing in the perihepatic fluid. 2. Slight enlargement of 1 of the left periaortic lymph nodes measuring 1.6 cm in short axis dimension (previously, 1.4 cm). 3. Redemonstrated sclerotic bony metastases diffusely throughout the skeleton. 4. Small bilateral pleural effusions, increasing in size in the interim. Aortic Atherosclerosis (ICD10-I70.0). Electronically Signed   By: Rogelia Myers M.D.   On: 04/23/2024 15:01   US  RENAL Result Date: 04/22/2024 CLINICAL DATA:  Acute renal failure.  Status post right nephrectomy. EXAM: RENAL / URINARY TRACT ULTRASOUND COMPLETE COMPARISON:  CT 03/21/2024 FINDINGS: Right Kidney: Absent Left Kidney: Renal measurements: 10.3 x 5.8 x 5.0 cm = volume: 157 mL. Echogenicity within normal limits. No mass or hydronephrosis visualized. Bladder: Appears normal for degree of bladder distention. Left ureteral jet identified Other: Complex moderate perihepatic and perisplenic ascites is present with scalloping of the liver margin better seen on prior CT examination IMPRESSION: 1. Normal sonographic appearance of the LEFT kidney. 2. Status post RIGHT nephrectomy. 3. Complex moderate perihepatic and perisplenic ascites with scalloping of the liver margin better seen on prior CT examination. Electronically Signed   By: Dorethia Molt M.D.   On: 04/22/2024 22:35   IR IMAGING GUIDED PORT INSERTION Result Date: 04/08/2024 CLINICAL DATA:   SMALL BOWEL CARCINOMA, ACCESS FOR CHEMOTHERAPY EXAM: RIGHT INTERNAL JUGULAR SINGLE LUMEN POWER PORT CATHETER INSERTION Date:  04/08/2024 04/08/2024 4:01 pm Radiologist:  CHRISTELLA. Frederic Specking, MD Guidance:  ULTRASOUND AND FLUOROSCOPIC MEDICATIONS: 1% lidocaine  local with epinephrine  ANESTHESIA/SEDATION: Versed  1.0 mg IV; Fentanyl  50 mcg IV; Moderate Sedation Time:  30 minutes The patient was continuously monitored during the procedure by the interventional radiology nurse under my direct supervision. FLUOROSCOPY: (7 mGy) COMPLICATIONS: None immediate. CONTRAST:  None. PROCEDURE: Informed consent was obtained from the patient following explanation of the procedure, risks, benefits and alternatives. The patient understands, agrees and consents for the procedure. All questions were addressed. A time out was performed. Maximal barrier sterile technique utilized including caps, mask, sterile gowns, sterile gloves, large sterile drape, hand hygiene, and 2% chlorhexidine  scrub. Under sterile conditions and local anesthesia, right internal jugular micropuncture venous access was performed. Access was performed with ultrasound. Images were obtained for documentation of the patent right internal jugular vein. A guide wire was inserted followed by a transitional dilator. This allowed insertion of a guide wire and catheter into the IVC. Measurements were obtained from the SVC / RA junction back to the right IJ venotomy site. In the right infraclavicular chest, a subcutaneous pocket was created over the second anterior rib. This was done under sterile conditions and local anesthesia. 1% lidocaine  with epinephrine  was utilized for this. A 2.5 cm incision  was made in the skin. Blunt dissection was performed to create a subcutaneous pocket over the right pectoralis major muscle. The pocket was flushed with saline vigorously. There was adequate hemostasis. The port catheter was assembled and checked for leakage. The port catheter was  secured in the pocket with two retention sutures. The tubing was tunneled subcutaneously to the right venotomy site and inserted into the SVC/RA junction through a valved peel-away sheath. Position was confirmed with fluoroscopy. Images were obtained for documentation. The patient tolerated the procedure well. No immediate complications. Incisions were closed in a two layer fashion with 4 - 0 Vicryl suture. Dermabond was applied to the skin. The port catheter was accessed, blood was aspirated followed by saline and heparin  flushes. Needle was removed. A dry sterile dressing was applied. IMPRESSION: Ultrasound and fluoroscopically guided right internal jugular single lumen power port catheter insertion. Tip in the SVC/RA junction. Catheter ready for use. Electronically Signed   By: CHRISTELLA.  Shick M.D.   On: 04/08/2024 16:12   (Echo, Carotid, EGD, Colonoscopy, ERCP)    Subjective: No complaints tolerating his diet  Discharge Exam: Vitals:   04/26/24 1643 04/26/24 2101  BP: 98/69 106/63  Pulse: 87 89  Resp: 18 18  Temp: 98 F (36.7 C) 98 F (36.7 C)  SpO2: 96% 98%   Vitals:   04/26/24 0710 04/26/24 0733 04/26/24 1643 04/26/24 2101  BP:  105/65 98/69 106/63  Pulse:  90 87 89  Resp:  18 18 18   Temp:  98.2 F (36.8 C) 98 F (36.7 C) 98 F (36.7 C)  TempSrc:      SpO2:  98% 96% 98%  Weight: 67 kg     Height:        General: Pt is alert, awake, not in acute distress Cardiovascular: RRR, S1/S2 +, no rubs, no gallops Respiratory: CTA bilaterally, no wheezing, no rhonchi Abdominal: Soft, NT, ND, bowel sounds + Extremities: no edema, no cyanosis    The results of significant diagnostics from this hospitalization (including imaging, microbiology, ancillary and laboratory) are listed below for reference.     Microbiology: No results found for this or any previous visit (from the past 240 hours).   Labs: BNP (last 3 results) No results for input(s): BNP in the last 8760 hours. Basic  Metabolic Panel: Recent Labs  Lab 04/23/24 0757 04/23/24 0915 04/24/24 0313 04/25/24 0337 04/26/24 0415  NA 150* 151* 155* 150* 152*  K 2.9* 2.8* 3.0* 3.1* 3.0*  CL 116* 115* 121* 116* 117*  CO2 24 25 24 24 23   GLUCOSE 211* 246* 146* 311* 103*  BUN 110* 106* 92* 80* 73*  CREATININE 5.80* 5.72* 5.74* 5.39* 5.58*  CALCIUM  8.6* 8.5* 8.5* 8.2* 8.6*  MG 2.4  --  2.2  --   --   PHOS  --   --   --  3.6 4.1   Liver Function Tests: Recent Labs  Lab 04/22/24 1215 04/23/24 0757 04/25/24 0337 04/26/24 0415  AST 29 24  --   --   ALT 27 19  --   --   ALKPHOS 108 64  --   --   BILITOT 0.4 0.5  --   --   PROT 6.6 5.1*  --   --   ALBUMIN 3.4* 2.1* 1.9* 1.9*   No results for input(s): LIPASE, AMYLASE in the last 168 hours. No results for input(s): AMMONIA in the last 168 hours. CBC: Recent Labs  Lab 04/22/24 1215 04/23/24 0915 04/24/24 9686  04/25/24 0337 04/26/24 0415  WBC 1.9* 1.0* 0.8* 1.4* 2.6*  NEUTROABS 1.1* 0.3* 0.1* 0.6* 1.4*  HGB 8.0* 6.5* 8.3* 7.9* 7.7*  HCT 26.1* 20.6* 26.6* 26.0* 25.6*  MCV 95.3 93.6 94.0 95.9 97.3  PLT 82* 74* 81* 95* 111*   Cardiac Enzymes: No results for input(s): CKTOTAL, CKMB, CKMBINDEX, TROPONINI in the last 168 hours. BNP: Invalid input(s): POCBNP CBG: Recent Labs  Lab 04/25/24 0703 04/25/24 1110 04/25/24 2127 04/26/24 1133 04/26/24 1639  GLUCAP 294* 265* 118* 127* 103*   D-Dimer No results for input(s): DDIMER in the last 72 hours. Hgb A1c No results for input(s): HGBA1C in the last 72 hours. Lipid Profile No results for input(s): CHOL, HDL, LDLCALC, TRIG, CHOLHDL, LDLDIRECT in the last 72 hours. Thyroid  function studies No results for input(s): TSH, T4TOTAL, T3FREE, THYROIDAB in the last 72 hours.  Invalid input(s): FREET3 Anemia work up No results for input(s): VITAMINB12, FOLATE, FERRITIN, TIBC, IRON, RETICCTPCT in the last 72 hours. Urinalysis    Component Value  Date/Time   COLORURINE YELLOW 04/23/2024 0149   APPEARANCEUR CLOUDY (A) 04/23/2024 0149   LABSPEC 1.011 04/23/2024 0149   PHURINE 5.0 04/23/2024 0149   GLUCOSEU NEGATIVE 04/23/2024 0149   HGBUR SMALL (A) 04/23/2024 0149   BILIRUBINUR NEGATIVE 04/23/2024 0149   KETONESUR NEGATIVE 04/23/2024 0149   PROTEINUR NEGATIVE 04/23/2024 0149   UROBILINOGEN 0.2 06/19/2007 1720   NITRITE NEGATIVE 04/23/2024 0149   LEUKOCYTESUR SMALL (A) 04/23/2024 0149   Sepsis Labs Recent Labs  Lab 04/23/24 0915 04/24/24 0313 04/25/24 0337 04/26/24 0415  WBC 1.0* 0.8* 1.4* 2.6*   Microbiology No results found for this or any previous visit (from the past 240 hours).   Time coordinating discharge: Over 35 minutes  SIGNED:   Erle Odell Castor, MD  [facility] 04/27/2024, 8:05 AM Pager   If 7PM-7AM, please contact night-coverage [email or web address] Password [email or web address]

## 2024-04-29 ENCOUNTER — Telehealth: Payer: Self-pay | Admitting: Oncology

## 2024-05-01 ENCOUNTER — Other Ambulatory Visit: Payer: Self-pay | Admitting: Pharmacy Technician

## 2024-05-01 ENCOUNTER — Other Ambulatory Visit: Payer: Self-pay

## 2024-05-01 NOTE — Progress Notes (Signed)
 Oral Oncology Patient Advocate Encounter  Disenrolling patient as he has decided on hospice.  Brandon Peters (Patty) [name], CPhT  [facility], [name], [facility] Oral Chemotherapy Patient Advocate Specialist III Phone: [phone]  Fax: [phone]

## 2024-05-03 ENCOUNTER — Telehealth: Payer: Self-pay | Admitting: *Deleted

## 2024-05-03 NOTE — Telephone Encounter (Signed)
 Informed Brandon Peters that [name] w/Hospice confirmed they will not pay for the 8/13 office visit, but he can file under his [facility] plan. He confirmed he wants to come in on 8/13

## 2024-05-05 ENCOUNTER — Other Ambulatory Visit: Payer: Self-pay | Admitting: Oncology

## 2024-05-06 ENCOUNTER — Inpatient Hospital Stay: Attending: Oncology | Admitting: Nutrition

## 2024-05-06 ENCOUNTER — Ambulatory Visit: Admitting: Podiatry

## 2024-05-06 DIAGNOSIS — C772 Secondary and unspecified malignant neoplasm of intra-abdominal lymph nodes: Secondary | ICD-10-CM | POA: Insufficient documentation

## 2024-05-06 DIAGNOSIS — C641 Malignant neoplasm of right kidney, except renal pelvis: Secondary | ICD-10-CM | POA: Insufficient documentation

## 2024-05-06 DIAGNOSIS — C179 Malignant neoplasm of small intestine, unspecified: Secondary | ICD-10-CM | POA: Insufficient documentation

## 2024-05-06 DIAGNOSIS — Q539 Undescended testicle, unspecified: Secondary | ICD-10-CM | POA: Insufficient documentation

## 2024-05-06 DIAGNOSIS — Z86711 Personal history of pulmonary embolism: Secondary | ICD-10-CM | POA: Insufficient documentation

## 2024-05-06 DIAGNOSIS — C786 Secondary malignant neoplasm of retroperitoneum and peritoneum: Secondary | ICD-10-CM | POA: Insufficient documentation

## 2024-05-06 DIAGNOSIS — E119 Type 2 diabetes mellitus without complications: Secondary | ICD-10-CM | POA: Insufficient documentation

## 2024-05-06 DIAGNOSIS — Z905 Acquired absence of kidney: Secondary | ICD-10-CM | POA: Insufficient documentation

## 2024-05-06 DIAGNOSIS — C61 Malignant neoplasm of prostate: Secondary | ICD-10-CM | POA: Insufficient documentation

## 2024-05-06 DIAGNOSIS — C7951 Secondary malignant neoplasm of bone: Secondary | ICD-10-CM | POA: Insufficient documentation

## 2024-05-06 LAB — GLUCOSE, CAPILLARY
Glucose-Capillary: 102 mg/dL — ABNORMAL HIGH (ref 70–99)
Glucose-Capillary: 87 mg/dL (ref 70–99)

## 2024-05-06 NOTE — Progress Notes (Signed)
 68 year old male diagnosed with Colorectal and Prostate cancer and followed by Dr. Cloretta. He is currently under the care of Hospice.  Referral placed by nursing prior to admission due to patient's poor appetite and difficulty eating. Patient was contacted and agreed to a telephone nutrition appointment.  He has lost about 4% weight over one month. Noted hospitalization from July 29 through August 2. He was admitted with failure to thrive, nausea and vomiting. Currently, he is able to eat more, but is having some difficulty swallowing foods such as meat and breads.He wants to increase his food intake at meals. He wants to add nutrients. He drinks one carton of Nepro in the morning. He is open to liberalizing his diet. He wants to know how he can make a high protein smoothie.  Nutrition Diagnosis: Food and Nutrition Related Knowledge deficit related to dysphagia.as evidenced by patient's self report of difficulty swallowing.  Intervention: Educated on ways to add gravies and sauces to foods to ease swallowing. Encouraged him to chop up meats or other hard foods prior to eating. Add dipping sauces or condiments as needed. Reviewed ways to increase protein overall and provided options of adding protein to smoothies. Patient appreciative. Encouraged him to call me with further questions.  Monitoring, Evaluation, Goals: Tolerate diet to improve quality of life.  No follow up scheduled. Patient given name and phone number for follow up as needed.

## 2024-05-07 NOTE — Addendum Note (Signed)
 Encounter addended by: Janice Lynwood BROCKS on: 05/07/2024 10:40 AM  Actions taken: Imaging Exam ended

## 2024-05-08 ENCOUNTER — Other Ambulatory Visit

## 2024-05-08 ENCOUNTER — Inpatient Hospital Stay (HOSPITAL_BASED_OUTPATIENT_CLINIC_OR_DEPARTMENT_OTHER): Admitting: Oncology

## 2024-05-08 ENCOUNTER — Ambulatory Visit

## 2024-05-08 VITALS — BP 95/56 | HR 96 | Temp 97.7°F | Resp 18 | Ht 65.0 in | Wt 144.7 lb

## 2024-05-08 DIAGNOSIS — E119 Type 2 diabetes mellitus without complications: Secondary | ICD-10-CM | POA: Diagnosis not present

## 2024-05-08 DIAGNOSIS — C786 Secondary malignant neoplasm of retroperitoneum and peritoneum: Secondary | ICD-10-CM | POA: Diagnosis present

## 2024-05-08 DIAGNOSIS — Z86711 Personal history of pulmonary embolism: Secondary | ICD-10-CM | POA: Diagnosis not present

## 2024-05-08 DIAGNOSIS — C179 Malignant neoplasm of small intestine, unspecified: Secondary | ICD-10-CM | POA: Diagnosis not present

## 2024-05-08 DIAGNOSIS — C61 Malignant neoplasm of prostate: Secondary | ICD-10-CM | POA: Diagnosis not present

## 2024-05-08 DIAGNOSIS — C772 Secondary and unspecified malignant neoplasm of intra-abdominal lymph nodes: Secondary | ICD-10-CM | POA: Diagnosis present

## 2024-05-08 DIAGNOSIS — Z905 Acquired absence of kidney: Secondary | ICD-10-CM | POA: Diagnosis not present

## 2024-05-08 DIAGNOSIS — C7951 Secondary malignant neoplasm of bone: Secondary | ICD-10-CM | POA: Diagnosis not present

## 2024-05-08 DIAGNOSIS — C641 Malignant neoplasm of right kidney, except renal pelvis: Secondary | ICD-10-CM

## 2024-05-08 DIAGNOSIS — Q539 Undescended testicle, unspecified: Secondary | ICD-10-CM | POA: Diagnosis not present

## 2024-05-08 NOTE — Progress Notes (Signed)
 Brandon Peters   Diagnosis: Renal cell carcinoma, small bowel carcinoma, prostate carcinoma  INTERVAL HISTORY:   Brandon Peters was discharged from the hospital 04/27/2024 after admission with acute renal failure.  He has enrolled in home hospice care.  He is not scheduled for follow-up with nephrology.  He completed 1 cycle of chemotherapy prior to hospital admission.  He decided against further chemotherapy and hemodialysis.  Brandon Peters denies pain.  He has difficulty eating.  No vomiting.  The lower legs are swollen.  He is here with his sister.  He had an episode of rectal bleeding with a bowel movement.  He saw bright red blood.  No consistent bleeding.  Objective:  Vital signs in last 24 hours:  Blood pressure (!) 95/56, pulse 96, temperature 97.7 F (36.5 C), resp. rate 18, height 5' 5 (1.651 m), weight 144 lb 11.2 oz (65.6 kg), SpO2 98%.    HEENT: No thrush Resp: Lungs clear bilaterally Cardio: Regular rate and rhythm GI: Soft, no mass, mild tenderness in the left mid lateral abdomen Vascular: 1+ pitting edema to lower leg bilaterally   Portacath/PICC-without erythema  Lab Results:  Lab Results  Component Value Date   WBC 2.6 (L) 04/26/2024   HGB 7.7 (L) 04/26/2024   HCT 25.6 (L) 04/26/2024   MCV 97.3 04/26/2024   PLT 111 (L) 04/26/2024   NEUTROABS 1.4 (L) 04/26/2024    CMP  Lab Results  Component Value Date   NA 152 (H) 04/26/2024   K 3.0 (L) 04/26/2024   CL 117 (H) 04/26/2024   CO2 23 04/26/2024   GLUCOSE 103 (H) 04/26/2024   BUN 73 (H) 04/26/2024   CREATININE 5.58 (H) 04/26/2024   CALCIUM  8.6 (L) 04/26/2024   PROT 5.1 (L) 04/23/2024   ALBUMIN 1.9 (L) 04/26/2024   AST 24 04/23/2024   ALT 19 04/23/2024   ALKPHOS 64 04/23/2024   BILITOT 0.5 04/23/2024   GFRNONAA 11 (L) 04/26/2024   GFRAA >60 03/05/2020    Lab Results  Component Value Date   CEA1 3.31 05/03/2019   CEA 4.56 04/09/2024    Lab Results  Component  Value Date   INR 1.1 03/07/2024   LABPROT 14.1 03/07/2024    Imaging:  No results found.  Medications: I have reviewed the patient's current medications.   Assessment/Plan: History of Severe anemia-status post a red cell transfusion 03/04/2016 2.. Prostate cancer Extensive sclerotic bone metastases noted on a CT of the abdomen/pelvis 03/04/2016 and MRI abdomen 03/05/2016 Markedly elevated PSA Lupron  initiated 03/15/2016; Casodex  14 days beginning 03/15/2016 Prostate biopsy 03/18/2016 05/09/2016 PSA significantly improved at 21 Abiraterone  initiated 05/10/2016 PSA less than 0.1 07/10/2018 PSA less than 0.1 08/30/2018 PSA less than 0.1 10/18/2018 PSA less than 0.1 11/29/2018 PSA less than 0.1 on 11/06/2019  PSA less than 0.1 on 03/05/2020 PSA less than 0.1 on 07/06/2020 PSA less than 0.1 08/17/2021 PSA less than 0.1 on 09/30/2021 PSA 0.1 on 11/12/2021 PSA 0.1 on 02/18/2022 PSA 0.2 on 12/09/2022 PSA 0.3 on 04/04/2023  PSA 0.4 on 06/06/2023 PSA 0.5 on 07/05/2023 PSA 0.7 on 08/09/2023 PSA 1.5 on 12/07/2023 PSA 4.7 on 02/06/2024 PSA 6.37 on 02/14/2024 PSA 5.0 on 02/22/2024 PSA 7.3 on 03/28/2024     3.   Symptoms of obstructive uropathy. Improved   4.   Distal duodenal and descending colon masses biopsy of the duodenal mass 03/04/2016 confirmed fragments of small bowel mucosa with focal high-grade dysplasia Biopsy of the descending colon polyps revealed  fragments of a tubulovillous adenoma CT 08/29/2017-similar soft tissue thickening in the region of the splenic flexure of the colon suboptimally evaluated due to lack of colonic enteric contrast Referred to Dr. Rollin Referred to [facility] Resection of descending colon polyps 01/31/2018, tubulovillous adenomas with high-grade dysplasia Attempted endoscopic resection of the fourth segment duodenal polyp on 05/09/2018, firm unresectable lesion, biopsy revealed moderately differentiated adenocarcinoma CT abdomen/pelvis 05/25/2018- heterogeneously  enhancing right lower pole renal lesion; diffuse sclerotic osseous metastatic lesions compatible with history of prostate cancer CT chest 05/25/2018- no pulmonary metastasis.  Diffuse osseous metastasis involving the axial and appendicular skeleton. 06/19/2018-sleeve resection of third and fourth portions of duodenum with primary end-to-end anastomosis, feeding gastrojejunostomy tube placement, Dr. Ames; poorly differentiated adenocarcinoma duodenum, 3 cm; tumor invades the muscularis propria; lymphovascular invasion present; negative margins; 4 of 11 involved lymph nodes; soft tissue deposits also seen in the mesentery; all mucosal margins and soft tissue margins negative but 2 of the positive nodes are found in the soft tissue margin submitted for frozen section (pT2 pN2); second mucosal lesion 1.2 cm from the proximal margin, tubular adenoma with focal severe dysplasia Intact expression of mismatch repair proteins by IHC; microsatellite stable Cycle 1 adjuvant Xeloda  07/23/2018 Cycle 2 adjuvant Xeloda  08/13/2018 Cycle 3 adjuvant Xeloda  09/10/2018 (dose reduced to 1000 mg twice daily for 14 days)  Cycle 4 adjuvant Xeloda  10/01/2018 (dose increased to 1500 mg every morning and 1000 mg every afternoon for 14 days) Cycle 5 adjuvant Xeloda  10/22/2018 Cycle 6 adjuvant Xeloda  11/12/2018 Cycle 7 adjuvant Xeloda  12/03/2018 Cycle 8 adjuvant Xeloda  12/24/2018   5.  Anorexia/weight loss- resolved   6.  Undescended testicles   7.  Right renal mass.  CT 08/29/2017-right renal mass involving the lower pole was partially calcified and does demonstrate enhancement following contrast most consistent with renal cell carcinoma.  Lesion not significantly enlarged compared with the prior CT. Followed by urology. Renal ultrasound 07/06/2021-enlargement of right renal mass CT abdomen 09/28/2020-large minute heterogenously enhancing mass in the inferior pole the right kidney with new fullness of the inferior branch of the right  renal vein, no enlarged abdominal lymph nodes, unchanged sclerotic osseous metastatic disease MRI abdomen 08/12/2021-heterogenously enhancing mass in the lower pole right kidney extending into the central sinus fat and posteriorly has mass-effect on the right psoas muscle without definite invasion, tumor thrombus in the right renal vein extending to the IVC/RA junction, upper normal size retrocaval node 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, nuclear grade 3 Axitinib /pembrolizumab  10/22/2021 Axitinib  placed on hold 11/05/2021 due to hand-foot syndrome Axitinib  resumed at a dose of 5 mg daily 11/12/2021, pembrolizumab  11/12/2021 Axitinib  discontinued 12/03/2021 secondary to progressive hand-foot syndrome, pembrolizumab  12/03/2021 Axitinib  resumed at a reduced dose of 2 mg twice daily after office visit 12/14/2021 Pembrolizumab  12/24/2021 Axitinib  stopped 01/03/2022 secondary to persistent hand/foot symptoms CT chest at [facility] 01/13/2022-no evidence of thoracic metastatic disease, similar diffuse sclerotic change in the skeleton MRI abdomen at [facility] 01/13/2022-similar size of right lower pole renal mass with renal vein thrombosis, decreased overall extent of thrombus now extending to the intrahepatic IVC, enhancement of right renal mass and thrombus is decreased, new left hydroureter ureter nephrosis with asymmetric perinephric stranding Pembrolizumab  01/27/2022 Axitinib  resumed 2 mg daily 01/28/2022 CT abdomen/pelvis at [facility] 02/10/2022-overall reduced size of right lower pole renal mass which demonstrates persistent region of enhancement along the posterior and lateral margin with extension into the renal sinus.  Renal vein thrombus extends into the IVC and is also reduced  in size.  Query additional IVC thrombus at the intrahepatic IVC and extending into the right atrium. Axitinib  continued 2 mg daily, Pembrolizumab  every 3 weeks CT 05/05/2022 at [facility]-partially visualized pulmonary embolism  involving segmental arteries of the right lower lobe.  Interval slight decrease in size of the mass arising from the lower pole the right kidney.  Mass demonstrates persistent moderate necrosis centrally.  Increased enhancing soft tissue component measuring up to 2 cm which is mildly increased in size compared to prior from 02/10/2022.  Bland tumor thrombus identified in the IVC at the level of the right renal vein ostium extending into the suprarenal IVC superiorly.  No obvious thrombus identified in the infrarenal, intrahepatic and suprahepatic IVC.  Likely trace bland thrombus in the anterior right renal vein.  Stable several subcentimeter retroperitoneal lymph nodes.  Diffuse osseous sclerotic and lytic metastatic disease stable compared to prior. Axitinib  placed on hold 05/04/2022 due to pain bilateral feet; 05/13/2022 he will continue to hold axitinib  Pembrolizumab  05/13/2022 Pembrolizumab  06/03/2022 Robotic right radical nephrectomy and IVC thrombectomy 06/27/2022-right kidney with tumor necrosis/hemorrhage,ypT0, adherent thrombus with cellular necrosis and no viable carcinoma present at the renal vein margin, other margins negative for carcinoma, 0/3 lymph nodes.  6.5 cm tumor including intraparenchymal lesion and thrombus in the renal vein 8.  Diabetes   9.  Anemia-stool Hemoccults + March 2021, referred to Dr. Rollin for endoscopic evaluation, anemia also secondary to metastatic prostate cancer, hypogonadism Small bowel enteroscopy 02/11/2020-esophagus normal.  Hematin found in the gastric body.  Examined duodenum normal.  No evidence of significant pathology in the entire examined portion of the jejunum.  There was no overt source of bleeding or inflammation.  The bleeding may be from small AVMs.  Surgical anastomosis was not visible with repeated inspection of the duodenum. Colonoscopy 02/11/2020-multiple sessile and semipedunculated polyps were found in the rectum, descending colon and ascending colon, 3  to 18 mm in size.  Polyps removed (polypectomy descending colon-fragments of tubular adenoma, inflammatory type polyp; polypectomy ascending colon-tubular adenoma, no high-grade dysplasia or malignancy; polypectomy descending colon-fragments of tubulovillous adenoma, no high-grade dysplasia or malignancy; polypectomy colon, rectum-tubular adenoma, no high-grade dysplasia or malignancy). Colonoscopy 02/02/2021-seven 2 to 10 mm polyps in the sigmoid colon (tubular adenoma), descending colon (fragments of tubulovillous adenoma, fragments of tubular adenoma ) and transverse colon (fragments of tubular adenoma). Progressive anemia 07/05/2021; stool cards positive Upper endoscopy/colonoscopy 09/23/2021-multiple polyps removed from the colon-tubular adenomas, diffuse angioectasias in the stomach with bleeding-not amenable to endoscopic treatment 10.  Renal failure-progressive 01/17/2022, progressive following right nephrectomy October 2023 11.  CT chest at [facility] 01/13/2022-no evidence of thoracic metastatic disease, similar diffuse sclerotic changes in the skeleton 12.  CT renal stone study 01/14/2022-mild left side hydroureteronephrosis secondary to a left UVJ stone, left nephrolithiasis, new right middle lobe collapse/consolidation, edema/fluid at the left iliopsoas muscle 13.  PE noted on CT 05/05/2022-on apixaban .  He saw Dr. Darcy at [facility] 05/16/2022; instructions are to hold apixaban  72 hours prior to scheduled surgery and start Lovenox at prophylactic dosing. 14.  Metastatic small bowel carcinoma  CTs at [facility] 02/13/2024-new periaortic lymphadenopathy and peritoneal and omental carcinomatosis, small amount of new ascites, similar-appearing diffuse bony metastatic disease, focal thickening of the gastric body Omental biopsy 03/07/2024-metastatic moderately differentiated adenocarcinoma compatible with GI/intestinal primary; foundation 1 microsatellite stable, tumor mutation burden 1, ERBB2 amplification equivocal, NRAS  Q61H; HER2 positive FISH CT 03/21/2024-similar abdominal retroperitoneal nodal metastasis.  1 node measuring minimally larger and the other minimally smaller.  Similar omental/peritoneal metastasis with mild increase in small volume abdominopelvic ascites.  Similar diffuse osseous metastasis.  New tiny left pleural effusion. Cycle 1 FOLFOX 04/09/2024  15.  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  He completed 1 cycle of FOLFOX chemotherapy.  The chemotherapy was complicated by pancytopenia and acute renal failure.  The renal function did not return to baseline while he was hospitalized a few weeks ago.  Brandon Peters decided against hemodialysis and further chemotherapy.  He has enrolled in home hospice care.  He would like to continue follow-up in the medical oncology clinic.  He will return for an office visit in 3 weeks.  He will try 0.5 mg of Ativan  if trazodone does not work for insomnia.  Brandon Hof, MD  05/08/2024  9:51 AM

## 2024-05-10 ENCOUNTER — Encounter

## 2024-05-29 ENCOUNTER — Encounter: Payer: Self-pay | Admitting: Nurse Practitioner

## 2024-05-29 ENCOUNTER — Inpatient Hospital Stay: Attending: Oncology | Admitting: Nurse Practitioner

## 2024-05-29 VITALS — BP 116/69 | HR 96 | Temp 97.8°F | Resp 18 | Ht 65.0 in | Wt 145.0 lb

## 2024-05-29 DIAGNOSIS — Z905 Acquired absence of kidney: Secondary | ICD-10-CM | POA: Diagnosis not present

## 2024-05-29 DIAGNOSIS — C772 Secondary and unspecified malignant neoplasm of intra-abdominal lymph nodes: Secondary | ICD-10-CM | POA: Insufficient documentation

## 2024-05-29 DIAGNOSIS — C641 Malignant neoplasm of right kidney, except renal pelvis: Secondary | ICD-10-CM | POA: Diagnosis not present

## 2024-05-29 DIAGNOSIS — Z95828 Presence of other vascular implants and grafts: Secondary | ICD-10-CM | POA: Diagnosis not present

## 2024-05-29 DIAGNOSIS — D649 Anemia, unspecified: Secondary | ICD-10-CM | POA: Diagnosis not present

## 2024-05-29 DIAGNOSIS — N19 Unspecified kidney failure: Secondary | ICD-10-CM | POA: Insufficient documentation

## 2024-05-29 DIAGNOSIS — E119 Type 2 diabetes mellitus without complications: Secondary | ICD-10-CM | POA: Insufficient documentation

## 2024-05-29 DIAGNOSIS — Z86711 Personal history of pulmonary embolism: Secondary | ICD-10-CM | POA: Diagnosis not present

## 2024-05-29 DIAGNOSIS — Z79899 Other long term (current) drug therapy: Secondary | ICD-10-CM | POA: Insufficient documentation

## 2024-05-29 DIAGNOSIS — C61 Malignant neoplasm of prostate: Secondary | ICD-10-CM | POA: Diagnosis not present

## 2024-05-29 DIAGNOSIS — Q539 Undescended testicle, unspecified: Secondary | ICD-10-CM | POA: Diagnosis not present

## 2024-05-29 DIAGNOSIS — C7951 Secondary malignant neoplasm of bone: Secondary | ICD-10-CM | POA: Insufficient documentation

## 2024-05-29 DIAGNOSIS — C179 Malignant neoplasm of small intestine, unspecified: Secondary | ICD-10-CM

## 2024-05-29 DIAGNOSIS — C786 Secondary malignant neoplasm of retroperitoneum and peritoneum: Secondary | ICD-10-CM | POA: Diagnosis present

## 2024-05-29 DIAGNOSIS — J90 Pleural effusion, not elsewhere classified: Secondary | ICD-10-CM | POA: Insufficient documentation

## 2024-05-29 NOTE — Progress Notes (Signed)
 [facility] OFFICE PROGRESS NOTE   Diagnosis: Renal cell carcinoma, small bowel carcinoma, prostate carcinoma  INTERVAL HISTORY:   Brandon Peters returns for follow-up.  He is enrolled in the hospice program.  Overall he feels he is doing fairly well.  No nausea or vomiting.  No pain.  Bowels are moving.  He describes appetite as so-so.  He notes he tolerates liquids better than solids.  Objective:  Vital signs in last 24 hours:  Blood pressure 116/69, pulse 96, temperature 97.8 F (36.6 C), temperature source Temporal, resp. rate 18, height 5' 5 (1.651 m), weight 145 lb (65.8 kg), SpO2 100%.    HEENT: No thrush or ulcers. Resp: Lungs clear bilaterally. Cardio: Regular rate and rhythm. GI: No hepatosplenomegaly.  No mass.  Nontender. Vascular: Pitting edema lower leg bilaterally. Neuro: Alert and oriented. Port-A-Cath without erythema.  Lab Results:  Lab Results  Component Value Date   WBC 2.6 (L) 04/26/2024   HGB 7.7 (L) 04/26/2024   HCT 25.6 (L) 04/26/2024   MCV 97.3 04/26/2024   PLT 111 (L) 04/26/2024   NEUTROABS 1.4 (L) 04/26/2024    Imaging:  No results found.  Medications: I have reviewed the patient's current medications.  Assessment/Plan: History of Severe anemia-status post a red cell transfusion 03/04/2016 2.. Prostate cancer Extensive sclerotic bone metastases noted on a CT of the abdomen/pelvis 03/04/2016 and MRI abdomen 03/05/2016 Markedly elevated PSA Lupron  initiated 03/15/2016; Casodex  14 days beginning 03/15/2016 Prostate biopsy 03/18/2016 05/09/2016 PSA significantly improved at 21 Abiraterone  initiated 05/10/2016 PSA less than 0.1 07/10/2018 PSA less than 0.1 08/30/2018 PSA less than 0.1 10/18/2018 PSA less than 0.1 11/29/2018 PSA less than 0.1 on 11/06/2019  PSA less than 0.1 on 03/05/2020 PSA less than 0.1 on 07/06/2020 PSA less than 0.1 08/17/2021 PSA less than 0.1 on 09/30/2021 PSA 0.1 on 11/12/2021 PSA 0.1 on 02/18/2022 PSA  0.2 on 12/09/2022 PSA 0.3 on 04/04/2023  PSA 0.4 on 06/06/2023 PSA 0.5 on 07/05/2023 PSA 0.7 on 08/09/2023 PSA 1.5 on 12/07/2023 PSA 4.7 on 02/06/2024 PSA 6.37 on 02/14/2024 PSA 5.0 on 02/22/2024 PSA 7.3 on 03/28/2024     3.   Symptoms of obstructive uropathy. Improved   4.   Distal duodenal and descending colon masses biopsy of the duodenal mass 03/04/2016 confirmed fragments of small bowel mucosa with focal high-grade dysplasia Biopsy of the descending colon polyps revealed fragments of a tubulovillous adenoma CT 08/29/2017-similar soft tissue thickening in the region of the splenic flexure of the colon suboptimally evaluated due to lack of colonic enteric contrast Referred to Dr. Rollin Referred to [facility] Resection of descending colon polyps 01/31/2018, tubulovillous adenomas with high-grade dysplasia Attempted endoscopic resection of the fourth segment duodenal polyp on 05/09/2018, firm unresectable lesion, biopsy revealed moderately differentiated adenocarcinoma CT abdomen/pelvis 05/25/2018- heterogeneously enhancing right lower pole renal lesion; diffuse sclerotic osseous metastatic lesions compatible with history of prostate cancer CT chest 05/25/2018- no pulmonary metastasis.  Diffuse osseous metastasis involving the axial and appendicular skeleton. 06/19/2018-sleeve resection of third and fourth portions of duodenum with primary end-to-end anastomosis, feeding gastrojejunostomy tube placement, Dr. Ames; poorly differentiated adenocarcinoma duodenum, 3 cm; tumor invades the muscularis propria; lymphovascular invasion present; negative margins; 4 of 11 involved lymph nodes; soft tissue deposits also seen in the mesentery; all mucosal margins and soft tissue margins negative but 2 of the positive nodes are found in the soft tissue margin submitted for frozen section (pT2 pN2); second mucosal lesion 1.2 cm from the proximal margin, tubular adenoma  with focal severe dysplasia Intact expression of mismatch  repair proteins by IHC; microsatellite stable Cycle 1 adjuvant Xeloda  07/23/2018 Cycle 2 adjuvant Xeloda  08/13/2018 Cycle 3 adjuvant Xeloda  09/10/2018 (dose reduced to 1000 mg twice daily for 14 days)  Cycle 4 adjuvant Xeloda  10/01/2018 (dose increased to 1500 mg every morning and 1000 mg every afternoon for 14 days) Cycle 5 adjuvant Xeloda  10/22/2018 Cycle 6 adjuvant Xeloda  11/12/2018 Cycle 7 adjuvant Xeloda  12/03/2018 Cycle 8 adjuvant Xeloda  12/24/2018   5.  Anorexia/weight loss- resolved   6.  Undescended testicles   7.  Right renal mass.  CT 08/29/2017-right renal mass involving the lower pole was partially calcified and does demonstrate enhancement following contrast most consistent with renal cell carcinoma.  Lesion not significantly enlarged compared with the prior CT. Followed by urology. 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 to the IVC/RA junction, upper normal size retrocaval node 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, nuclear grade 3 Axitinib /pembrolizumab  10/22/2021 Axitinib  placed on hold 11/05/2021 due to hand-foot syndrome Axitinib  resumed at a dose of 5 mg daily 11/12/2021, pembrolizumab  11/12/2021 Axitinib  discontinued 12/03/2021 secondary to progressive hand-foot syndrome, pembrolizumab  12/03/2021 Axitinib  resumed at a reduced dose of 2 mg twice daily after office visit 12/14/2021 Pembrolizumab  12/24/2021 Axitinib  stopped 01/03/2022 secondary to  persistent hand/foot symptoms CT chest [facility] 01/13/2022-no evidence of thoracic metastatic disease, similar diffuse sclerotic change in the skeleton MRI abdomen at [facility] 01/13/2022-similar size of right lower pole renal mass with renal vein thrombosis, decreased overall extent of thrombus now extending to the intrahepatic IVC, enhancement of right renal mass and thrombus is decreased, new left hydroureter ureter nephrosis with asymmetric perinephric stranding Pembrolizumab  01/27/2022 Axitinib  resumed 2 mg daily 01/28/2022 CT abdomen/pelvis at [facility] 02/10/2022-overall reduced size of right lower pole renal mass which demonstrates persistent region of enhancement along the posterior and lateral margin with extension into the renal sinus.  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  Query additional IVC thrombus at the intrahepatic IVC and extending into the right atrium. Axitinib  continued 2 mg daily, Pembrolizumab  every 3 weeks CT 05/05/2022 at [facility]-partially visualized pulmonary embolism involving segmental arteries of the right lower lobe.  Interval slight decrease in size of the mass arising from the lower pole the right kidney.  Mass demonstrates persistent moderate necrosis centrally.  Increased enhancing soft tissue component measuring up to 2 cm which is mildly increased in size compared to prior from 02/10/2022.  Bland tumor thrombus identified in the IVC at the level of the right renal vein ostium extending into the suprarenal IVC superiorly.  No obvious thrombus identified in the infrarenal, intrahepatic and suprahepatic IVC.  Likely trace bland thrombus in the anterior right renal vein.  Stable several subcentimeter retroperitoneal lymph nodes.  Diffuse osseous sclerotic and lytic metastatic disease stable compared to prior. Axitinib  placed on hold 05/04/2022 due to pain bilateral feet; 05/13/2022 he will continue to hold axitinib  Pembrolizumab  05/13/2022 Pembrolizumab  06/03/2022 Robotic  right radical nephrectomy and IVC thrombectomy 06/27/2022-right kidney with tumor necrosis/hemorrhage,ypT0, adherent thrombus with cellular  necrosis and no viable carcinoma present at the renal vein margin, other margins negative for carcinoma, 0/3 lymph nodes.  6.5 cm tumor including intraparenchymal lesion and thrombus in the renal vein 8.  Diabetes   9.  Anemia-stool Hemoccults + March 2021, referred to Dr. Rollin for endoscopic evaluation, anemia also secondary to metastatic prostate cancer, hypogonadism Small bowel enteroscopy 02/11/2020-esophagus normal.  Hematin found in the gastric body.  Examined duodenum normal.  No evidence of significant pathology in the entire examined portion of the jejunum.  There was no overt source of bleeding or inflammation.  The bleeding may be from small AVMs.  Surgical anastomosis was not visible with repeated inspection of the duodenum. Colonoscopy 02/11/2020-multiple sessile and semipedunculated polyps were found in the rectum, descending colon and ascending colon, 3 to 18 mm in size.  Polyps removed (polypectomy descending colon-fragments of tubular adenoma, inflammatory type polyp; polypectomy ascending colon-tubular adenoma, no high-grade dysplasia or malignancy; polypectomy descending colon-fragments of tubulovillous adenoma, no high-grade dysplasia or malignancy; polypectomy colon, rectum-tubular adenoma, no high-grade dysplasia or malignancy). Colonoscopy 02/02/2021-seven 2 to 10 mm polyps in the sigmoid colon (tubular adenoma), descending colon (fragments of tubulovillous adenoma, fragments of tubular adenoma ) and transverse colon (fragments of tubular adenoma). Progressive anemia 07/05/2021; stool cards positive Upper endoscopy/colonoscopy 09/23/2021-multiple polyps removed from the colon-tubular adenomas, diffuse angioectasias in the stomach with bleeding-not amenable to endoscopic treatment 10.  Renal failure-progressive 01/17/2022, progressive following right  nephrectomy October 2023 11.  CT chest at [facility] 01/13/2022-no evidence of thoracic metastatic disease, similar diffuse sclerotic changes in the skeleton 12.  CT renal stone study 01/14/2022-mild left side hydroureteronephrosis secondary to a left UVJ stone, left nephrolithiasis, new right middle lobe collapse/consolidation, edema/fluid at the left iliopsoas muscle 13.  PE noted on CT 05/05/2022-on apixaban .  He saw Dr. Darcy at [facility] 05/16/2022; instructions are to hold apixaban  72 hours prior to scheduled surgery and start Lovenox at prophylactic dosing. 14.  Metastatic small bowel carcinoma  CTs at [facility] 02/13/2024-new periaortic lymphadenopathy and peritoneal and omental carcinomatosis, small amount of new ascites, similar-appearing diffuse bony metastatic disease, focal thickening of the gastric body Omental biopsy 03/07/2024-metastatic moderately differentiated adenocarcinoma compatible with GI/intestinal primary; foundation 1 microsatellite stable, tumor mutation burden 1, ERBB2 amplification equivocal, NRAS Q61H; HER2 positive FISH CT 03/21/2024-similar abdominal retroperitoneal nodal metastasis.  1 node measuring minimally larger and the other minimally smaller.  Similar omental/peritoneal metastasis with mild increase in small volume abdominopelvic ascites.  Similar diffuse osseous metastasis.  New tiny left pleural effusion. Cycle 1 FOLFOX 04/09/2024  15.  Admission 04/22/2024 with nausea/vomiting and progressive renal failure    Disposition: Brandon Peters appears stable.  He is being followed on a supportive/comfort care approach.  He is enrolled in home hospice.    He has a Port-A-Cath which we will flush today.    He would like to continue follow-up in the medical oncology clinic.  He will return for follow-up and a port flush in 4 to 6 weeks.  We are available to see him sooner if needed.    Olam Ned ANP/GNP-BC   05/29/2024  1:27 PM

## 2024-06-19 ENCOUNTER — Telehealth: Payer: Self-pay | Admitting: *Deleted

## 2024-06-19 NOTE — Telephone Encounter (Addendum)
 Spoke with triage nurse w/Hospice to inquire why he is not allowed to take Mylanta. If not, could he take Protonix  20 mg daily (will need the powder packet since he can't swallow pills and you can't crush them). She will speak w/his nurse to try to figure this issue out and call back. Hospice nurse returned call and said they were not told he can't take Mylanta. They will call sister and go over meds again if if Mylanta does not help will revisit.

## 2024-06-19 NOTE — Telephone Encounter (Signed)
 Call from [name] sister reporting he is having a lot of acid reflux that comes into his throat. Has also developed hiccups. Was having some vomiting and has been started on prochlorperazine  suppository 10 mg with some relief. They have OTC Mylanta at home, but hospice nurse told them not to take it. Asking for Dr. Andriette thoughts.

## 2024-06-28 ENCOUNTER — Encounter: Payer: Self-pay | Admitting: Oncology

## 2024-06-28 ENCOUNTER — Telehealth: Payer: Self-pay | Admitting: *Deleted

## 2024-06-28 NOTE — Telephone Encounter (Signed)
 Ms. Rho called to report that Brandon Peters died 07-30-24 in the evening. Thankful for his care.

## 2024-07-03 ENCOUNTER — Encounter

## 2024-07-03 ENCOUNTER — Ambulatory Visit: Admitting: Oncology

## 2024-07-27 DEATH — deceased
